# Patient Record
Sex: Female | Born: 1935 | Race: White | Hispanic: No | Smoking: Former smoker
Health system: Southern US, Community
[De-identification: ages and names within clinical notes are randomized; demographics above are authoritative.]

## PROBLEM LIST (undated history)

## (undated) DIAGNOSIS — N393 Stress incontinence (female) (male): Secondary | ICD-10-CM

## (undated) DIAGNOSIS — J45909 Unspecified asthma, uncomplicated: Secondary | ICD-10-CM

## (undated) DIAGNOSIS — K5790 Diverticulosis of intestine, part unspecified, without perforation or abscess without bleeding: Secondary | ICD-10-CM

## (undated) DIAGNOSIS — K219 Gastro-esophageal reflux disease without esophagitis: Secondary | ICD-10-CM

## (undated) DIAGNOSIS — G459 Transient cerebral ischemic attack, unspecified: Secondary | ICD-10-CM

## (undated) DIAGNOSIS — M1990 Unspecified osteoarthritis, unspecified site: Secondary | ICD-10-CM

## (undated) DIAGNOSIS — I480 Paroxysmal atrial fibrillation: Secondary | ICD-10-CM

## (undated) DIAGNOSIS — J449 Chronic obstructive pulmonary disease, unspecified: Secondary | ICD-10-CM

## (undated) DIAGNOSIS — Z8489 Family history of other specified conditions: Secondary | ICD-10-CM

## (undated) DIAGNOSIS — E785 Hyperlipidemia, unspecified: Secondary | ICD-10-CM

## (undated) DIAGNOSIS — C439 Malignant melanoma of skin, unspecified: Secondary | ICD-10-CM

## (undated) DIAGNOSIS — I2510 Atherosclerotic heart disease of native coronary artery without angina pectoris: Secondary | ICD-10-CM

## (undated) DIAGNOSIS — K5900 Constipation, unspecified: Secondary | ICD-10-CM

## (undated) DIAGNOSIS — G629 Polyneuropathy, unspecified: Secondary | ICD-10-CM

## (undated) DIAGNOSIS — F419 Anxiety disorder, unspecified: Secondary | ICD-10-CM

## (undated) DIAGNOSIS — I10 Essential (primary) hypertension: Secondary | ICD-10-CM

## (undated) HISTORY — PX: NASAL SINUS SURGERY: SHX719

## (undated) HISTORY — PX: COLONOSCOPY: SHX174

## (undated) HISTORY — PX: APPENDECTOMY: SHX54

## (undated) HISTORY — DX: Essential (primary) hypertension: I10

## (undated) HISTORY — DX: Atherosclerotic heart disease of native coronary artery without angina pectoris: I25.10

## (undated) HISTORY — PX: EYE SURGERY: SHX253

## (undated) HISTORY — DX: Paroxysmal atrial fibrillation: I48.0

## (undated) HISTORY — PX: CHOLECYSTECTOMY: SHX55

## (undated) HISTORY — PX: CORONARY ANGIOPLASTY WITH STENT PLACEMENT: SHX49

## (undated) HISTORY — DX: Gastro-esophageal reflux disease without esophagitis: K21.9

## (undated) HISTORY — PX: VESICOVAGINAL FISTULA CLOSURE W/ TAH: SUR271

## (undated) HISTORY — DX: Hyperlipidemia, unspecified: E78.5

## (undated) HISTORY — DX: Diverticulosis of intestine, part unspecified, without perforation or abscess without bleeding: K57.90

## (undated) HISTORY — PX: BACK SURGERY: SHX140

## (undated) HISTORY — PX: ABDOMINAL HYSTERECTOMY: SHX81

---

## 1997-12-26 ENCOUNTER — Ambulatory Visit (HOSPITAL_COMMUNITY): Admission: RE | Admit: 1997-12-26 | Discharge: 1997-12-26 | Payer: Self-pay | Admitting: Obstetrics & Gynecology

## 1998-01-25 ENCOUNTER — Other Ambulatory Visit: Admission: RE | Admit: 1998-01-25 | Discharge: 1998-01-25 | Payer: Self-pay | Admitting: Obstetrics & Gynecology

## 1998-02-28 ENCOUNTER — Other Ambulatory Visit: Admission: RE | Admit: 1998-02-28 | Discharge: 1998-02-28 | Payer: Self-pay | Admitting: Gastroenterology

## 1998-02-28 ENCOUNTER — Encounter: Payer: Self-pay | Admitting: Gastroenterology

## 1998-12-30 ENCOUNTER — Ambulatory Visit (HOSPITAL_COMMUNITY): Admission: RE | Admit: 1998-12-30 | Discharge: 1998-12-30 | Payer: Self-pay | Admitting: Obstetrics & Gynecology

## 1999-02-28 ENCOUNTER — Other Ambulatory Visit: Admission: RE | Admit: 1999-02-28 | Discharge: 1999-02-28 | Payer: Self-pay | Admitting: Obstetrics & Gynecology

## 1999-10-13 ENCOUNTER — Emergency Department (HOSPITAL_COMMUNITY): Admission: EM | Admit: 1999-10-13 | Discharge: 1999-10-13 | Payer: Self-pay | Admitting: *Deleted

## 1999-10-15 ENCOUNTER — Ambulatory Visit (HOSPITAL_COMMUNITY): Admission: RE | Admit: 1999-10-15 | Discharge: 1999-10-15 | Payer: Self-pay | Admitting: Family Medicine

## 1999-11-11 ENCOUNTER — Encounter: Payer: Self-pay | Admitting: *Deleted

## 1999-11-11 ENCOUNTER — Ambulatory Visit (HOSPITAL_COMMUNITY): Admission: RE | Admit: 1999-11-11 | Discharge: 1999-11-11 | Payer: Self-pay | Admitting: *Deleted

## 2000-01-20 ENCOUNTER — Encounter: Payer: Self-pay | Admitting: *Deleted

## 2000-01-20 ENCOUNTER — Encounter: Admission: RE | Admit: 2000-01-20 | Discharge: 2000-01-20 | Payer: Self-pay | Admitting: *Deleted

## 2000-01-22 ENCOUNTER — Ambulatory Visit (HOSPITAL_BASED_OUTPATIENT_CLINIC_OR_DEPARTMENT_OTHER): Admission: RE | Admit: 2000-01-22 | Discharge: 2000-01-22 | Payer: Self-pay | Admitting: *Deleted

## 2000-01-22 ENCOUNTER — Encounter (INDEPENDENT_AMBULATORY_CARE_PROVIDER_SITE_OTHER): Payer: Self-pay | Admitting: *Deleted

## 2000-02-11 ENCOUNTER — Ambulatory Visit (HOSPITAL_COMMUNITY): Admission: RE | Admit: 2000-02-11 | Discharge: 2000-02-11 | Payer: Self-pay | Admitting: Obstetrics & Gynecology

## 2000-02-11 ENCOUNTER — Encounter: Payer: Self-pay | Admitting: Obstetrics & Gynecology

## 2000-07-22 ENCOUNTER — Encounter: Payer: Self-pay | Admitting: Gastroenterology

## 2000-07-28 ENCOUNTER — Encounter: Payer: Self-pay | Admitting: Gastroenterology

## 2000-07-28 ENCOUNTER — Encounter (INDEPENDENT_AMBULATORY_CARE_PROVIDER_SITE_OTHER): Payer: Self-pay

## 2000-07-28 ENCOUNTER — Other Ambulatory Visit: Admission: RE | Admit: 2000-07-28 | Discharge: 2000-07-28 | Payer: Self-pay | Admitting: Gastroenterology

## 2000-08-09 ENCOUNTER — Encounter: Payer: Self-pay | Admitting: Family Medicine

## 2000-08-09 ENCOUNTER — Encounter: Admission: RE | Admit: 2000-08-09 | Discharge: 2000-08-09 | Payer: Self-pay | Admitting: Family Medicine

## 2001-02-14 ENCOUNTER — Ambulatory Visit (HOSPITAL_COMMUNITY): Admission: RE | Admit: 2001-02-14 | Discharge: 2001-02-14 | Payer: Self-pay | Admitting: Obstetrics & Gynecology

## 2001-02-14 ENCOUNTER — Encounter: Payer: Self-pay | Admitting: Obstetrics & Gynecology

## 2001-04-27 ENCOUNTER — Encounter: Payer: Self-pay | Admitting: Obstetrics & Gynecology

## 2001-04-27 ENCOUNTER — Ambulatory Visit (HOSPITAL_COMMUNITY): Admission: RE | Admit: 2001-04-27 | Discharge: 2001-04-27 | Payer: Self-pay | Admitting: Obstetrics & Gynecology

## 2001-12-21 ENCOUNTER — Emergency Department (HOSPITAL_COMMUNITY): Admission: EM | Admit: 2001-12-21 | Discharge: 2001-12-22 | Payer: Self-pay | Admitting: Emergency Medicine

## 2001-12-21 ENCOUNTER — Encounter: Payer: Self-pay | Admitting: Emergency Medicine

## 2002-02-16 ENCOUNTER — Encounter: Payer: Self-pay | Admitting: Obstetrics & Gynecology

## 2002-02-16 ENCOUNTER — Ambulatory Visit (HOSPITAL_COMMUNITY): Admission: RE | Admit: 2002-02-16 | Discharge: 2002-02-16 | Payer: Self-pay | Admitting: Obstetrics & Gynecology

## 2002-05-17 ENCOUNTER — Other Ambulatory Visit: Admission: RE | Admit: 2002-05-17 | Discharge: 2002-05-17 | Payer: Self-pay | Admitting: Obstetrics & Gynecology

## 2003-02-20 ENCOUNTER — Ambulatory Visit (HOSPITAL_COMMUNITY): Admission: RE | Admit: 2003-02-20 | Discharge: 2003-02-20 | Payer: Self-pay | Admitting: Obstetrics & Gynecology

## 2003-02-20 ENCOUNTER — Encounter: Payer: Self-pay | Admitting: Obstetrics & Gynecology

## 2003-06-27 ENCOUNTER — Encounter: Admission: RE | Admit: 2003-06-27 | Discharge: 2003-06-27 | Payer: Self-pay | Admitting: Orthopedic Surgery

## 2003-06-27 ENCOUNTER — Ambulatory Visit (HOSPITAL_BASED_OUTPATIENT_CLINIC_OR_DEPARTMENT_OTHER): Admission: RE | Admit: 2003-06-27 | Discharge: 2003-06-27 | Payer: Self-pay | Admitting: Orthopedic Surgery

## 2003-06-27 ENCOUNTER — Ambulatory Visit (HOSPITAL_COMMUNITY): Admission: RE | Admit: 2003-06-27 | Discharge: 2003-06-27 | Payer: Self-pay | Admitting: Orthopedic Surgery

## 2003-07-23 ENCOUNTER — Ambulatory Visit (HOSPITAL_BASED_OUTPATIENT_CLINIC_OR_DEPARTMENT_OTHER): Admission: RE | Admit: 2003-07-23 | Discharge: 2003-07-23 | Payer: Self-pay | Admitting: Orthopedic Surgery

## 2003-07-23 ENCOUNTER — Ambulatory Visit (HOSPITAL_COMMUNITY): Admission: RE | Admit: 2003-07-23 | Discharge: 2003-07-23 | Payer: Self-pay | Admitting: Orthopedic Surgery

## 2003-11-19 ENCOUNTER — Encounter: Payer: Self-pay | Admitting: Gastroenterology

## 2004-03-18 ENCOUNTER — Ambulatory Visit (HOSPITAL_COMMUNITY): Admission: RE | Admit: 2004-03-18 | Discharge: 2004-03-18 | Payer: Self-pay | Admitting: Obstetrics & Gynecology

## 2004-06-26 ENCOUNTER — Ambulatory Visit (HOSPITAL_COMMUNITY): Admission: RE | Admit: 2004-06-26 | Discharge: 2004-06-26 | Payer: Self-pay | Admitting: Family Medicine

## 2004-07-29 ENCOUNTER — Encounter: Admission: RE | Admit: 2004-07-29 | Discharge: 2004-07-29 | Payer: Self-pay | Admitting: Family Medicine

## 2004-08-24 ENCOUNTER — Emergency Department (HOSPITAL_COMMUNITY): Admission: EM | Admit: 2004-08-24 | Discharge: 2004-08-24 | Payer: Self-pay | Admitting: Emergency Medicine

## 2005-01-16 ENCOUNTER — Encounter: Admission: RE | Admit: 2005-01-16 | Discharge: 2005-01-16 | Payer: Self-pay | Admitting: Family Medicine

## 2005-03-04 ENCOUNTER — Ambulatory Visit: Payer: Self-pay | Admitting: Pulmonary Disease

## 2005-03-05 ENCOUNTER — Ambulatory Visit: Payer: Self-pay | Admitting: Internal Medicine

## 2005-03-05 ENCOUNTER — Encounter: Payer: Self-pay | Admitting: Pulmonary Disease

## 2005-04-01 ENCOUNTER — Ambulatory Visit: Payer: Self-pay | Admitting: Pulmonary Disease

## 2005-04-22 ENCOUNTER — Emergency Department (HOSPITAL_COMMUNITY): Admission: EM | Admit: 2005-04-22 | Discharge: 2005-04-22 | Payer: Self-pay | Admitting: Emergency Medicine

## 2005-04-29 ENCOUNTER — Ambulatory Visit: Payer: Self-pay | Admitting: Pulmonary Disease

## 2005-05-05 ENCOUNTER — Ambulatory Visit: Payer: Self-pay | Admitting: Pulmonary Disease

## 2005-05-15 ENCOUNTER — Ambulatory Visit (HOSPITAL_COMMUNITY): Admission: RE | Admit: 2005-05-15 | Discharge: 2005-05-15 | Payer: Self-pay | Admitting: Obstetrics & Gynecology

## 2005-05-20 ENCOUNTER — Ambulatory Visit: Payer: Self-pay | Admitting: Pulmonary Disease

## 2005-06-16 ENCOUNTER — Encounter: Payer: Self-pay | Admitting: Pulmonary Disease

## 2005-06-22 ENCOUNTER — Ambulatory Visit: Payer: Self-pay | Admitting: Pulmonary Disease

## 2005-07-06 DIAGNOSIS — I2510 Atherosclerotic heart disease of native coronary artery without angina pectoris: Secondary | ICD-10-CM

## 2005-07-06 HISTORY — DX: Atherosclerotic heart disease of native coronary artery without angina pectoris: I25.10

## 2005-07-10 ENCOUNTER — Ambulatory Visit: Payer: Self-pay | Admitting: Pulmonary Disease

## 2005-07-15 ENCOUNTER — Other Ambulatory Visit: Admission: RE | Admit: 2005-07-15 | Discharge: 2005-07-15 | Payer: Self-pay | Admitting: Obstetrics & Gynecology

## 2005-08-20 ENCOUNTER — Ambulatory Visit: Payer: Self-pay | Admitting: Pulmonary Disease

## 2005-10-05 ENCOUNTER — Ambulatory Visit: Payer: Self-pay | Admitting: Pulmonary Disease

## 2005-10-19 ENCOUNTER — Ambulatory Visit: Payer: Self-pay | Admitting: Pulmonary Disease

## 2005-10-22 ENCOUNTER — Encounter: Payer: Self-pay | Admitting: Vascular Surgery

## 2005-10-22 ENCOUNTER — Ambulatory Visit (HOSPITAL_COMMUNITY): Admission: RE | Admit: 2005-10-22 | Discharge: 2005-10-22 | Payer: Self-pay | Admitting: Cardiology

## 2005-11-03 ENCOUNTER — Ambulatory Visit: Payer: Self-pay | Admitting: Internal Medicine

## 2005-11-14 ENCOUNTER — Encounter: Admission: RE | Admit: 2005-11-14 | Discharge: 2005-11-14 | Payer: Self-pay | Admitting: Family Medicine

## 2005-11-17 ENCOUNTER — Ambulatory Visit: Payer: Self-pay | Admitting: Internal Medicine

## 2005-12-01 ENCOUNTER — Ambulatory Visit: Payer: Self-pay | Admitting: Internal Medicine

## 2005-12-15 ENCOUNTER — Ambulatory Visit: Payer: Self-pay | Admitting: Internal Medicine

## 2005-12-29 ENCOUNTER — Ambulatory Visit: Payer: Self-pay | Admitting: Internal Medicine

## 2006-01-04 ENCOUNTER — Ambulatory Visit: Payer: Self-pay | Admitting: Pulmonary Disease

## 2006-01-12 ENCOUNTER — Ambulatory Visit: Payer: Self-pay | Admitting: Pulmonary Disease

## 2006-02-09 ENCOUNTER — Ambulatory Visit: Payer: Self-pay | Admitting: Pulmonary Disease

## 2006-03-10 ENCOUNTER — Ambulatory Visit: Payer: Self-pay | Admitting: Pulmonary Disease

## 2006-03-24 ENCOUNTER — Ambulatory Visit: Payer: Self-pay | Admitting: Pulmonary Disease

## 2006-04-07 ENCOUNTER — Ambulatory Visit: Payer: Self-pay | Admitting: Pulmonary Disease

## 2006-04-21 ENCOUNTER — Ambulatory Visit: Payer: Self-pay | Admitting: Pulmonary Disease

## 2006-05-05 ENCOUNTER — Ambulatory Visit: Payer: Self-pay | Admitting: Pulmonary Disease

## 2006-05-19 ENCOUNTER — Ambulatory Visit: Payer: Self-pay | Admitting: Pulmonary Disease

## 2006-06-06 ENCOUNTER — Emergency Department (HOSPITAL_COMMUNITY): Admission: EM | Admit: 2006-06-06 | Discharge: 2006-06-06 | Payer: Self-pay | Admitting: Family Medicine

## 2006-06-16 ENCOUNTER — Ambulatory Visit (HOSPITAL_COMMUNITY): Admission: RE | Admit: 2006-06-16 | Discharge: 2006-06-16 | Payer: Self-pay | Admitting: Obstetrics & Gynecology

## 2006-07-14 ENCOUNTER — Ambulatory Visit: Payer: Self-pay | Admitting: Pulmonary Disease

## 2006-07-15 ENCOUNTER — Encounter: Payer: Self-pay | Admitting: Pulmonary Disease

## 2006-08-15 ENCOUNTER — Emergency Department (HOSPITAL_COMMUNITY): Admission: EM | Admit: 2006-08-15 | Discharge: 2006-08-15 | Payer: Self-pay

## 2006-08-25 ENCOUNTER — Ambulatory Visit: Payer: Self-pay | Admitting: Internal Medicine

## 2006-09-02 ENCOUNTER — Ambulatory Visit: Payer: Self-pay | Admitting: Pulmonary Disease

## 2006-09-07 ENCOUNTER — Ambulatory Visit: Payer: Self-pay | Admitting: Pulmonary Disease

## 2006-09-21 ENCOUNTER — Ambulatory Visit: Payer: Self-pay | Admitting: Pulmonary Disease

## 2006-10-05 ENCOUNTER — Ambulatory Visit: Payer: Self-pay | Admitting: Pulmonary Disease

## 2006-10-08 ENCOUNTER — Encounter: Payer: Self-pay | Admitting: Pulmonary Disease

## 2006-10-19 ENCOUNTER — Ambulatory Visit: Payer: Self-pay | Admitting: Pulmonary Disease

## 2006-11-04 ENCOUNTER — Ambulatory Visit: Payer: Self-pay | Admitting: Pulmonary Disease

## 2006-11-18 ENCOUNTER — Ambulatory Visit: Payer: Self-pay | Admitting: Pulmonary Disease

## 2006-11-30 ENCOUNTER — Ambulatory Visit: Payer: Self-pay | Admitting: Pulmonary Disease

## 2006-12-03 ENCOUNTER — Ambulatory Visit: Payer: Self-pay | Admitting: Pulmonary Disease

## 2006-12-17 ENCOUNTER — Ambulatory Visit: Payer: Self-pay | Admitting: Pulmonary Disease

## 2006-12-31 ENCOUNTER — Ambulatory Visit: Payer: Self-pay | Admitting: Pulmonary Disease

## 2007-01-14 ENCOUNTER — Ambulatory Visit: Payer: Self-pay | Admitting: Pulmonary Disease

## 2007-01-28 ENCOUNTER — Ambulatory Visit: Payer: Self-pay | Admitting: Pulmonary Disease

## 2007-02-11 ENCOUNTER — Ambulatory Visit: Payer: Self-pay | Admitting: Pulmonary Disease

## 2007-02-25 ENCOUNTER — Ambulatory Visit: Payer: Self-pay | Admitting: Pulmonary Disease

## 2007-03-09 ENCOUNTER — Ambulatory Visit: Payer: Self-pay | Admitting: Pulmonary Disease

## 2007-03-23 ENCOUNTER — Ambulatory Visit: Payer: Self-pay | Admitting: Pulmonary Disease

## 2007-04-06 ENCOUNTER — Ambulatory Visit: Payer: Self-pay | Admitting: Pulmonary Disease

## 2007-04-20 ENCOUNTER — Ambulatory Visit: Payer: Self-pay | Admitting: Pulmonary Disease

## 2007-05-04 ENCOUNTER — Ambulatory Visit: Payer: Self-pay | Admitting: Pulmonary Disease

## 2007-05-10 ENCOUNTER — Ambulatory Visit: Payer: Self-pay | Admitting: Pulmonary Disease

## 2007-05-18 ENCOUNTER — Ambulatory Visit: Payer: Self-pay | Admitting: Pulmonary Disease

## 2007-06-01 ENCOUNTER — Ambulatory Visit: Payer: Self-pay | Admitting: Pulmonary Disease

## 2007-06-15 ENCOUNTER — Ambulatory Visit: Payer: Self-pay | Admitting: Pulmonary Disease

## 2007-06-15 DIAGNOSIS — J45909 Unspecified asthma, uncomplicated: Secondary | ICD-10-CM | POA: Insufficient documentation

## 2007-06-28 ENCOUNTER — Ambulatory Visit: Payer: Self-pay | Admitting: Internal Medicine

## 2007-07-13 ENCOUNTER — Ambulatory Visit: Payer: Self-pay | Admitting: Pulmonary Disease

## 2007-07-13 DIAGNOSIS — J329 Chronic sinusitis, unspecified: Secondary | ICD-10-CM | POA: Insufficient documentation

## 2007-07-27 ENCOUNTER — Ambulatory Visit: Payer: Self-pay | Admitting: Pulmonary Disease

## 2007-07-29 ENCOUNTER — Ambulatory Visit (HOSPITAL_COMMUNITY): Admission: RE | Admit: 2007-07-29 | Discharge: 2007-07-29 | Payer: Self-pay | Admitting: Obstetrics & Gynecology

## 2007-08-04 ENCOUNTER — Telehealth: Payer: Self-pay | Admitting: Pulmonary Disease

## 2007-08-10 ENCOUNTER — Ambulatory Visit: Payer: Self-pay | Admitting: Pulmonary Disease

## 2007-08-24 ENCOUNTER — Ambulatory Visit: Payer: Self-pay | Admitting: Pulmonary Disease

## 2007-09-07 ENCOUNTER — Ambulatory Visit: Payer: Self-pay | Admitting: Pulmonary Disease

## 2007-09-17 ENCOUNTER — Emergency Department (HOSPITAL_COMMUNITY): Admission: EM | Admit: 2007-09-17 | Discharge: 2007-09-17 | Payer: Self-pay | Admitting: Emergency Medicine

## 2007-09-21 ENCOUNTER — Ambulatory Visit: Payer: Self-pay | Admitting: Pulmonary Disease

## 2007-09-23 ENCOUNTER — Ambulatory Visit (HOSPITAL_COMMUNITY): Admission: RE | Admit: 2007-09-23 | Discharge: 2007-09-23 | Payer: Self-pay | Admitting: Cardiology

## 2007-09-23 ENCOUNTER — Encounter: Payer: Self-pay | Admitting: Pulmonary Disease

## 2007-09-30 ENCOUNTER — Telehealth: Payer: Self-pay | Admitting: Pulmonary Disease

## 2007-10-06 ENCOUNTER — Ambulatory Visit: Payer: Self-pay | Admitting: Pulmonary Disease

## 2007-10-20 ENCOUNTER — Ambulatory Visit: Payer: Self-pay | Admitting: Pulmonary Disease

## 2007-11-04 ENCOUNTER — Ambulatory Visit: Payer: Self-pay | Admitting: Pulmonary Disease

## 2007-11-10 ENCOUNTER — Ambulatory Visit: Payer: Self-pay | Admitting: Pulmonary Disease

## 2007-11-17 ENCOUNTER — Ambulatory Visit: Payer: Self-pay | Admitting: Pulmonary Disease

## 2007-11-22 ENCOUNTER — Telehealth: Payer: Self-pay | Admitting: Pulmonary Disease

## 2007-12-01 ENCOUNTER — Ambulatory Visit: Payer: Self-pay | Admitting: Pulmonary Disease

## 2007-12-15 ENCOUNTER — Ambulatory Visit: Payer: Self-pay | Admitting: Pulmonary Disease

## 2007-12-16 ENCOUNTER — Telehealth: Payer: Self-pay | Admitting: Pulmonary Disease

## 2007-12-29 ENCOUNTER — Ambulatory Visit: Payer: Self-pay | Admitting: Pulmonary Disease

## 2008-01-13 ENCOUNTER — Ambulatory Visit: Payer: Self-pay | Admitting: Pulmonary Disease

## 2008-01-27 ENCOUNTER — Ambulatory Visit: Payer: Self-pay | Admitting: Pulmonary Disease

## 2008-02-10 ENCOUNTER — Ambulatory Visit: Payer: Self-pay | Admitting: Pulmonary Disease

## 2008-02-16 ENCOUNTER — Emergency Department (HOSPITAL_COMMUNITY): Admission: EM | Admit: 2008-02-16 | Discharge: 2008-02-17 | Payer: Self-pay | Admitting: Emergency Medicine

## 2008-02-19 ENCOUNTER — Emergency Department (HOSPITAL_COMMUNITY): Admission: EM | Admit: 2008-02-19 | Discharge: 2008-02-19 | Payer: Self-pay | Admitting: Family Medicine

## 2008-02-24 ENCOUNTER — Ambulatory Visit: Payer: Self-pay | Admitting: Pulmonary Disease

## 2008-02-24 ENCOUNTER — Encounter: Admission: RE | Admit: 2008-02-24 | Discharge: 2008-02-24 | Payer: Self-pay | Admitting: Orthopedic Surgery

## 2008-03-05 ENCOUNTER — Encounter: Admission: RE | Admit: 2008-03-05 | Discharge: 2008-03-05 | Payer: Self-pay | Admitting: Orthopedic Surgery

## 2008-03-06 ENCOUNTER — Ambulatory Visit (HOSPITAL_BASED_OUTPATIENT_CLINIC_OR_DEPARTMENT_OTHER): Admission: RE | Admit: 2008-03-06 | Discharge: 2008-03-06 | Payer: Self-pay | Admitting: Orthopedic Surgery

## 2008-03-13 ENCOUNTER — Ambulatory Visit: Payer: Self-pay | Admitting: Pulmonary Disease

## 2008-03-27 ENCOUNTER — Ambulatory Visit: Payer: Self-pay | Admitting: Pulmonary Disease

## 2008-04-06 ENCOUNTER — Ambulatory Visit: Payer: Self-pay | Admitting: Pulmonary Disease

## 2008-04-10 ENCOUNTER — Ambulatory Visit: Payer: Self-pay | Admitting: Pulmonary Disease

## 2008-04-24 ENCOUNTER — Ambulatory Visit: Payer: Self-pay | Admitting: Pulmonary Disease

## 2008-05-08 ENCOUNTER — Ambulatory Visit: Payer: Self-pay | Admitting: Pulmonary Disease

## 2008-05-14 DIAGNOSIS — F321 Major depressive disorder, single episode, moderate: Secondary | ICD-10-CM

## 2008-05-22 ENCOUNTER — Ambulatory Visit: Payer: Self-pay | Admitting: Pulmonary Disease

## 2008-06-05 ENCOUNTER — Ambulatory Visit: Payer: Self-pay | Admitting: Pulmonary Disease

## 2008-06-05 ENCOUNTER — Encounter: Payer: Self-pay | Admitting: Internal Medicine

## 2008-06-19 ENCOUNTER — Ambulatory Visit: Payer: Self-pay | Admitting: Pulmonary Disease

## 2008-07-03 ENCOUNTER — Ambulatory Visit: Payer: Self-pay | Admitting: Pulmonary Disease

## 2008-07-16 ENCOUNTER — Ambulatory Visit: Payer: Self-pay | Admitting: Pulmonary Disease

## 2008-07-30 ENCOUNTER — Ambulatory Visit: Payer: Self-pay | Admitting: Pulmonary Disease

## 2008-08-14 ENCOUNTER — Ambulatory Visit: Payer: Self-pay | Admitting: Pulmonary Disease

## 2008-08-20 ENCOUNTER — Telehealth: Payer: Self-pay | Admitting: Pulmonary Disease

## 2008-08-22 ENCOUNTER — Ambulatory Visit (HOSPITAL_COMMUNITY): Admission: RE | Admit: 2008-08-22 | Discharge: 2008-08-22 | Payer: Self-pay | Admitting: Obstetrics & Gynecology

## 2008-08-28 ENCOUNTER — Ambulatory Visit: Payer: Self-pay | Admitting: Pulmonary Disease

## 2008-09-11 ENCOUNTER — Ambulatory Visit: Payer: Self-pay | Admitting: Pulmonary Disease

## 2008-09-21 ENCOUNTER — Telehealth: Payer: Self-pay | Admitting: Pulmonary Disease

## 2008-09-26 ENCOUNTER — Ambulatory Visit: Payer: Self-pay | Admitting: Pulmonary Disease

## 2008-09-28 ENCOUNTER — Encounter (INDEPENDENT_AMBULATORY_CARE_PROVIDER_SITE_OTHER): Payer: Self-pay | Admitting: *Deleted

## 2008-10-09 ENCOUNTER — Ambulatory Visit: Payer: Self-pay | Admitting: Pulmonary Disease

## 2008-10-23 ENCOUNTER — Ambulatory Visit: Payer: Self-pay | Admitting: Pulmonary Disease

## 2008-11-06 ENCOUNTER — Ambulatory Visit: Payer: Self-pay | Admitting: Pulmonary Disease

## 2008-11-13 ENCOUNTER — Ambulatory Visit: Payer: Self-pay | Admitting: Pulmonary Disease

## 2008-11-13 DIAGNOSIS — J301 Allergic rhinitis due to pollen: Secondary | ICD-10-CM | POA: Insufficient documentation

## 2008-11-21 ENCOUNTER — Ambulatory Visit: Payer: Self-pay | Admitting: Internal Medicine

## 2008-12-05 ENCOUNTER — Ambulatory Visit: Payer: Self-pay | Admitting: Pulmonary Disease

## 2008-12-19 ENCOUNTER — Ambulatory Visit: Payer: Self-pay | Admitting: Pulmonary Disease

## 2009-01-02 ENCOUNTER — Ambulatory Visit: Payer: Self-pay | Admitting: Pulmonary Disease

## 2009-01-16 ENCOUNTER — Ambulatory Visit: Payer: Self-pay | Admitting: Pulmonary Disease

## 2009-01-30 ENCOUNTER — Ambulatory Visit: Payer: Self-pay | Admitting: Pulmonary Disease

## 2009-02-08 ENCOUNTER — Observation Stay (HOSPITAL_COMMUNITY): Admission: EM | Admit: 2009-02-08 | Discharge: 2009-02-08 | Payer: Self-pay | Admitting: Emergency Medicine

## 2009-02-08 ENCOUNTER — Ambulatory Visit: Payer: Self-pay | Admitting: Internal Medicine

## 2009-02-13 ENCOUNTER — Ambulatory Visit: Payer: Self-pay | Admitting: Pulmonary Disease

## 2009-02-27 ENCOUNTER — Ambulatory Visit: Payer: Self-pay | Admitting: Pulmonary Disease

## 2009-03-05 ENCOUNTER — Emergency Department (HOSPITAL_COMMUNITY): Admission: EM | Admit: 2009-03-05 | Discharge: 2009-03-05 | Payer: Self-pay | Admitting: Emergency Medicine

## 2009-03-13 ENCOUNTER — Ambulatory Visit: Payer: Self-pay | Admitting: Pulmonary Disease

## 2009-03-13 ENCOUNTER — Encounter: Payer: Self-pay | Admitting: Internal Medicine

## 2009-03-27 ENCOUNTER — Ambulatory Visit: Payer: Self-pay | Admitting: Pulmonary Disease

## 2009-04-10 ENCOUNTER — Ambulatory Visit: Payer: Self-pay | Admitting: Pulmonary Disease

## 2009-04-18 ENCOUNTER — Ambulatory Visit: Payer: Self-pay | Admitting: Pulmonary Disease

## 2009-04-24 ENCOUNTER — Ambulatory Visit: Payer: Self-pay | Admitting: Pulmonary Disease

## 2009-05-08 ENCOUNTER — Ambulatory Visit: Payer: Self-pay | Admitting: Pulmonary Disease

## 2009-05-22 ENCOUNTER — Ambulatory Visit: Payer: Self-pay | Admitting: Pulmonary Disease

## 2009-06-06 ENCOUNTER — Ambulatory Visit: Payer: Self-pay | Admitting: Pulmonary Disease

## 2009-06-26 ENCOUNTER — Ambulatory Visit: Payer: Self-pay | Admitting: Pulmonary Disease

## 2009-07-02 DIAGNOSIS — F411 Generalized anxiety disorder: Secondary | ICD-10-CM | POA: Insufficient documentation

## 2009-07-11 ENCOUNTER — Ambulatory Visit: Payer: Self-pay | Admitting: Pulmonary Disease

## 2009-07-25 ENCOUNTER — Ambulatory Visit: Payer: Self-pay | Admitting: Pulmonary Disease

## 2009-07-30 ENCOUNTER — Encounter (INDEPENDENT_AMBULATORY_CARE_PROVIDER_SITE_OTHER): Payer: Self-pay | Admitting: *Deleted

## 2009-08-08 ENCOUNTER — Ambulatory Visit: Payer: Self-pay | Admitting: Pulmonary Disease

## 2009-08-21 ENCOUNTER — Ambulatory Visit: Payer: Self-pay | Admitting: Pulmonary Disease

## 2009-08-23 ENCOUNTER — Ambulatory Visit: Payer: Self-pay | Admitting: Gastroenterology

## 2009-08-23 ENCOUNTER — Encounter (INDEPENDENT_AMBULATORY_CARE_PROVIDER_SITE_OTHER): Payer: Self-pay | Admitting: *Deleted

## 2009-08-23 DIAGNOSIS — R634 Abnormal weight loss: Secondary | ICD-10-CM

## 2009-08-23 DIAGNOSIS — Z860100 Personal history of colon polyps, unspecified: Secondary | ICD-10-CM | POA: Insufficient documentation

## 2009-08-23 DIAGNOSIS — R198 Other specified symptoms and signs involving the digestive system and abdomen: Secondary | ICD-10-CM | POA: Insufficient documentation

## 2009-09-04 ENCOUNTER — Ambulatory Visit: Payer: Self-pay | Admitting: Pulmonary Disease

## 2009-09-13 ENCOUNTER — Ambulatory Visit (HOSPITAL_COMMUNITY): Admission: RE | Admit: 2009-09-13 | Discharge: 2009-09-13 | Payer: Self-pay | Admitting: Obstetrics & Gynecology

## 2009-09-18 ENCOUNTER — Ambulatory Visit: Payer: Self-pay | Admitting: Pulmonary Disease

## 2009-09-23 ENCOUNTER — Telehealth: Payer: Self-pay | Admitting: Pulmonary Disease

## 2009-10-02 ENCOUNTER — Ambulatory Visit: Payer: Self-pay | Admitting: Pulmonary Disease

## 2009-10-10 ENCOUNTER — Ambulatory Visit: Payer: Self-pay | Admitting: Gastroenterology

## 2009-10-11 ENCOUNTER — Encounter: Payer: Self-pay | Admitting: Gastroenterology

## 2009-10-11 ENCOUNTER — Encounter (INDEPENDENT_AMBULATORY_CARE_PROVIDER_SITE_OTHER): Payer: Self-pay

## 2009-10-14 ENCOUNTER — Ambulatory Visit: Payer: Self-pay | Admitting: Gastroenterology

## 2009-10-14 LAB — CONVERTED CEMR LAB
BUN: 10 mg/dL (ref 6–23)
Creatinine, Ser: 0.8 mg/dL (ref 0.4–1.2)

## 2009-10-15 ENCOUNTER — Ambulatory Visit: Payer: Self-pay | Admitting: Pulmonary Disease

## 2009-10-16 ENCOUNTER — Ambulatory Visit (HOSPITAL_COMMUNITY): Admission: RE | Admit: 2009-10-16 | Discharge: 2009-10-16 | Payer: Self-pay | Admitting: Gastroenterology

## 2009-10-29 ENCOUNTER — Ambulatory Visit: Payer: Self-pay | Admitting: Pulmonary Disease

## 2009-11-08 ENCOUNTER — Ambulatory Visit: Payer: Self-pay | Admitting: Pulmonary Disease

## 2009-11-22 ENCOUNTER — Ambulatory Visit: Payer: Self-pay | Admitting: Pulmonary Disease

## 2009-12-10 ENCOUNTER — Ambulatory Visit: Payer: Self-pay | Admitting: Critical Care Medicine

## 2009-12-24 ENCOUNTER — Encounter: Payer: Self-pay | Admitting: Internal Medicine

## 2009-12-24 ENCOUNTER — Ambulatory Visit: Payer: Self-pay | Admitting: Pulmonary Disease

## 2010-01-07 ENCOUNTER — Ambulatory Visit: Payer: Self-pay | Admitting: Pulmonary Disease

## 2010-01-21 ENCOUNTER — Ambulatory Visit: Payer: Self-pay | Admitting: Pulmonary Disease

## 2010-02-04 ENCOUNTER — Ambulatory Visit: Payer: Self-pay | Admitting: Pulmonary Disease

## 2010-02-18 ENCOUNTER — Ambulatory Visit: Payer: Self-pay | Admitting: Pulmonary Disease

## 2010-03-04 ENCOUNTER — Ambulatory Visit: Payer: Self-pay | Admitting: Critical Care Medicine

## 2010-03-12 ENCOUNTER — Ambulatory Visit: Payer: Self-pay | Admitting: Pulmonary Disease

## 2010-03-18 ENCOUNTER — Ambulatory Visit: Payer: Self-pay | Admitting: Pulmonary Disease

## 2010-04-01 ENCOUNTER — Ambulatory Visit: Payer: Self-pay | Admitting: Pulmonary Disease

## 2010-04-15 ENCOUNTER — Ambulatory Visit: Payer: Self-pay | Admitting: Pulmonary Disease

## 2010-04-23 ENCOUNTER — Ambulatory Visit: Payer: Self-pay | Admitting: Pulmonary Disease

## 2010-04-29 ENCOUNTER — Ambulatory Visit: Payer: Self-pay | Admitting: Pulmonary Disease

## 2010-05-13 ENCOUNTER — Ambulatory Visit: Payer: Self-pay | Admitting: Pulmonary Disease

## 2010-05-30 ENCOUNTER — Ambulatory Visit: Payer: Self-pay | Admitting: Pulmonary Disease

## 2010-06-13 ENCOUNTER — Ambulatory Visit: Payer: Self-pay | Admitting: Pulmonary Disease

## 2010-06-26 ENCOUNTER — Ambulatory Visit: Payer: Self-pay | Admitting: Pulmonary Disease

## 2010-06-26 ENCOUNTER — Telehealth: Payer: Self-pay | Admitting: Pulmonary Disease

## 2010-07-10 ENCOUNTER — Ambulatory Visit: Admit: 2010-07-10 | Payer: Self-pay | Admitting: Pulmonary Disease

## 2010-07-10 ENCOUNTER — Ambulatory Visit
Admission: RE | Admit: 2010-07-10 | Discharge: 2010-07-10 | Payer: Self-pay | Source: Home / Self Care | Attending: Pulmonary Disease | Admitting: Pulmonary Disease

## 2010-07-24 ENCOUNTER — Ambulatory Visit
Admission: RE | Admit: 2010-07-24 | Discharge: 2010-07-24 | Payer: Self-pay | Source: Home / Self Care | Attending: Pulmonary Disease | Admitting: Pulmonary Disease

## 2010-08-07 ENCOUNTER — Encounter: Payer: Self-pay | Admitting: Pulmonary Disease

## 2010-08-07 ENCOUNTER — Ambulatory Visit: Admit: 2010-08-07 | Payer: Self-pay | Admitting: Pulmonary Disease

## 2010-08-07 ENCOUNTER — Ambulatory Visit (INDEPENDENT_AMBULATORY_CARE_PROVIDER_SITE_OTHER): Payer: MEDICARE

## 2010-08-07 DIAGNOSIS — J45909 Unspecified asthma, uncomplicated: Secondary | ICD-10-CM

## 2010-08-07 NOTE — Assessment & Plan Note (Signed)
SummaryJunius Dennis   Nurse Visit   Allergies: 1)  ! Pcn 2)  ! Sulfa 3)  ! Vancomycin 4)  ! Tetracycline 5)  ! * Iv Contrast  Medication Administration  Injection # 1:    Medication: Xolair (omalizumab) 150mg     Diagnosis: 493.00    Route: SQ    Site: L deltoid    Exp Date: 01/2013    Lot #: [id number]    Mfr: Salome Spotted    Comments: 1.0 ML X 2 IN LEFT AND 1.0 ML IN RIGHT ARM PT WAITED 30 MINS CHARGED J2357 AND 04540    Patient tolerated injection without complications    Given by: TAMMY SCOTT IN ALLERGY LAB   Medication Administration  Injection # 1:    Medication: Xolair (omalizumab) 150mg     Diagnosis: 493.00    Route: SQ    Site: L deltoid    Exp Date: 01/2013    Lot #: [id number]    Mfr: Salome Spotted    Comments: 1.0 ML X 2 IN LEFT AND 1.0 ML IN RIGHT ARM PT WAITED 30 MINS CHARGED J2357 AND 47829    Patient tolerated injection without complications    Given by: TAMMY SCOTT IN ALLERGY LAB

## 2010-08-07 NOTE — Assessment & Plan Note (Signed)
Summary: xolair 2 wks ///kp   Nurse Visit   Allergies: 1)  ! Pcn 2)  ! Sulfa 3)  ! Vancomycin 4)  ! Tetracycline 5)  ! * Iv Contrast  Medication Administration  Injection # 1:    Medication: Xolair (omalizumab) 150mg     Diagnosis: EXTRINSIC ASTHMA, UNSPECIFIED (ICD-493.00)    Route: IM    Site: R deltoid    Exp Date: 04/05/2013    Lot #: [id number]    Mfr: Salome Spotted    Comments: Xolair 375 mg and 90 units 1.0 ml x 2 in Right deltoid and 1.0 ml x 1 in left deltoid. Pt didn't w    Given by: Dimas Millin, Allergy tech  Orders Added: 1)  Xolair (omalizumab) 150mg  [J2357] 2)  Administration xolair injection [phone]

## 2010-08-07 NOTE — Assessment & Plan Note (Signed)
Summary: rov for severe asthma   Copy to:  [facility] Johnnell Liou/Referring Kahmari Koller:  Herb Grays, MD  CC:  Pt is here for a 4 month f/u appt on her asthma. Pt states she has "good days and bad days" regarding her breathing- will occ get sob with exertion.  Pt states she does cough up "opaque" colored sputum.  [name]  History of Present Illness: the pt comes in today for f/u of her known severe asthma both intrinsic and extrinsic.  She has maintained on xolair and an aggressive bronchodilator regimen, and has maintained a fairly stable functional baseline.  Some days are better than others, but she has not had a recent flare or pulmonary infection.    Current Medications (verified): 1)  Lasix 40 Mg  Tabs (Furosemide) .... Take 1 Tablet By Mouth Once A Day As Needed 2)  Advair Diskus 500-50 Mcg/dose  Misc (Fluticasone-Salmeterol) .[name].. 1 Puff Two Times A Day 3)  Xalatan 0.005 %  Soln (Latanoprost) .[name].. 1 Drop in Each Eye At Bedtime 4)  Digoxin 0.125 Mg Tabs (Digoxin) .... Take 1 Tablet By Mouth Once A Day 5)  Exforge 10-320 Mg  Tabs (Amlodipine Besylate-Valsartan) .... One Tablet Daily. 6)  Xolair 150 Mg  Solr (Omalizumab) .... 375 Mg Every Two Weeks 7)  Coumadin 2.5 Mg Tabs (Warfarin Sodium) .... Take As Directed By Coumadin Clinic 8)  Nasonex 50 Mcg/act Susp (Mometasone Furoate) .... Use 2 Sprays in Each Nostril Once Daily As Needed 9)  Combivent 103-18 Mcg/act Aero (Ipratropium-Albuterol) .... Use As Directed As Needed 10)  Xanax 0.25 Mg Tabs (Alprazolam) .... Take As Needed 11)  Welchol .... Take 1 Tablet By Mouth Once A Day 12)  Aspirin Ec Low Dose 81 Mg Tbec (Aspirin) .... Take 1 Tab By Mouth Every Other Day 13)  Coq10 200 Mg Caps (Coenzyme Q10) .... Take 1 Tablet By Mouth Once A Day  Allergies (verified): 1)  ! Pcn 2)  ! Sulfa 3)  ! Vancomycin 4)  ! Tetracycline 5)  ! * Iv Contrast  Review of Systems       The patient complains of shortness of breath with activity,  productive cough, irregular heartbeats, nasal congestion/difficulty breathing through nose, sneezing, hand/feet swelling, and joint stiffness or pain.  The patient denies shortness of breath at rest, non-productive cough, coughing up blood, chest pain, acid heartburn, indigestion, loss of appetite, weight change, abdominal pain, difficulty swallowing, sore throat, tooth/dental problems, headaches, itching, ear ache, anxiety, depression, rash, change in color of mucus, and fever.    Vital Signs:  Patient profile:   77 year old female Height:      67 inches Weight:      145.38 pounds O2 Sat:      96 % on Room air Temp:     97.9 degrees F oral Pulse rate:   66 / minute BP sitting:   138 / 70  (left arm) Cuff size:   regular  Vitals Entered By: Arman Filter LPN (March 12, 2010 10:30 AM)  O2 Flow:  Room air CC: Pt is here for a 4 month f/u appt on her asthma. Pt states she has "good days and bad days" regarding her breathing- will occ get sob with exertion.  Pt states she does cough up "opaque" colored sputum.   Comments Medications reviewed with patient Arman Filter LPN  March 12, 2010 10:30 AM    Physical Exam  General:  wd female in nad Nose:  no  discharge or purulence noted. Lungs:  mild basilar crackles, a few rhonchi, no wheezing Heart:  rrr, no mrg Extremities:  1+ LE edema, no cyanosis  Neurologic:  alert and oriented, moves all 4.   Impression & Recommendations:  Problem # 1:  ASTHMA (ICD-493.90)  the pt has severe asthma that has done reasonably well on her current regimen.  However, she has had increased variability in her symptoms, and I wonder if she may respond to singulair with more consistent control   Will give her a 4 week trial, but she is to stop if she doesn't see a big difference.  She is to continue on xolair.  I  also wonder how much volume overload may contribute to her symptom variability.  I have asked her to keep an eye on this.  Medications  Added to Medication List This Visit: 1)  Welchol  .... Take 1 tablet by mouth once a day 2)  Aspirin Ec Low Dose 81 Mg Tbec (Aspirin) .... Take 1 tab by mouth every other day 3)  Coq10 200 Mg Caps (Coenzyme q10) .... Take 1 tablet by mouth once a day  Other Orders: Est. Patient Level III ([address])  Patient Instructions: 1)  will try singulair 10mg  one each day for next 4 weeks.  If doesn't make a considerable difference, no reason to stay on.  If does make a difference, let me know. 2)  consider more consistent use of diuretic if having persistent swelling. 3)  followup with me in 4mos.

## 2010-08-07 NOTE — Assessment & Plan Note (Signed)
Summary: xolair/apc   Nurse Visit   Allergies: 1)  ! Pcn 2)  ! Sulfa 3)  ! Vancomycin 4)  ! Tetracycline 5)  ! * Iv Contrast  Medication Administration  Injection # 1:    Medication: Xolair (omalizumab) 150mg     Diagnosis: EXTRINSIC ASTHMA, UNSPECIFIED (ICD-493.00)    Route: IM    Site: R deltoid    Exp Date: 02/03/2013    Lot #: [id number]    Mfr: Salome Spotted    Comments: xolair 375 mg, 90 units 1.0 ml x 2 in right deltoid and 1.0 ml x 1 in left deltoid. Pt waited 20 mins.    Given by: Carver Fila, CMA  Injection # 2:    Medication:    Orders Added: 1)  Xolair (omalizumab) 150mg  [J2357] 2)  Administration xolair injection [phone]

## 2010-08-07 NOTE — Assessment & Plan Note (Signed)
Summary: xolair/apc   Nurse Visit   Allergies: 1)  ! Pcn 2)  ! Sulfa 3)  ! Vancomycin 4)  ! Tetracycline  Medication Administration  Injection # 1:    Medication: Xolair (omalizumab) 150mg     Diagnosis: EXTRINSIC ASTHMA, UNSPECIFIED (ICD-493.00)    Route: SQ    Site: R deltoid    Exp Date: 10/2012    Lot #: [id number]    Mfr: Mendel Ryder    Comments: Injection given by Dimas Millin in allergy lab. Xolair 375mg . 1.86ml x 2 in Right and 1.67ml x 1 in Left Deltoid. Pt waited 30 minutes.     Patient tolerated injection without complications  Orders Added: 1)  Admin of Therapeutic Inj  intramuscular or subcutaneous [96372] 2)  Xolair (omalizumab) 150mg  [J2357]

## 2010-08-07 NOTE — Letter (Signed)
Summary: New Patient letter  [facility]  [address] [place], [place] [address]   Phone: [phone]  Fax: [phone]       07/30/2009 MRN: [id number]  [facility] [address] [place], [place]  [address]  Dear Ms. [facility],  Welcome to the Gastroenterology Division at [facility].    You are scheduled to see Dr.  Russella Dar on 08/23/2009 at 10:15am on the 3rd floor at [facility], [address]  We ask that you try to arrive at our office 15 minutes prior to your appointment time to allow for check-in.  We would like you to complete the enclosed self-administered evaluation form prior to your visit and bring it with you on the day of your appointment.  We will review it with you.  Also, please bring a complete list of all your medications or, if you prefer, bring the medication bottles and we will list them.  Please bring your insurance card so that we may make a copy of it.  If your insurance requires a referral to see a specialist, please bring your referral form from your primary care physician.  Co-payments are due at the time of your visit and may be paid by cash, check or credit card.     Your office visit will consist of a consult with your physician (includes a physical exam), any laboratory testing he/she may order, scheduling of any necessary diagnostic testing (e.g. x-ray, ultrasound, CT-scan), and scheduling of a procedure (e.g. Endoscopy, Colonoscopy) if required.  Please allow enough time on your schedule to allow for any/all of these possibilities.    If you cannot keep your appointment, please call [phone] to cancel or reschedule prior to your appointment date.  This allows [place] the opportunity to schedule an appointment for another patient in need of care.  If you do not cancel or reschedule by 5 p.m. the business day prior to your appointment date, you will be charged a $50.00 late cancellation/no-show fee.    Thank you for  choosing [facility] for your medical needs.  We appreciate the opportunity to care for you.  Please visit [place] at our website  to learn more about our practice.                     Sincerely,                                                             The Gastroenterology Division

## 2010-08-07 NOTE — Assessment & Plan Note (Signed)
Summary: 2 WKS [name] ///kp   Nurse Visit   Allergies: 1)  ! Pcn 2)  ! Sulfa 3)  ! Vancomycin 4)  ! Tetracycline  Medication Administration  Injection # 1:    Medication: Xolair (omalizumab) 150mg     Diagnosis: EXTRINSIC ASTHMA, UNSPECIFIED (ICD-493.00)    Route: SQ    Site: L deltoid    Exp Date: 10/2012    Lot #: [id number]    Mfr: Mendel Ryder    Comments: Injection given by Dimas Millin in allergy lab. Xolair 375mg . 1.34ml x 2 in Left Deltoid and 1.6ml x 1 in Right Deltoid. Pt waited 30 minutes.     Patient tolerated injection without complications  Orders Added: 1)  Admin of Therapeutic Inj  intramuscular or subcutaneous [96372] 2)  Xolair (omalizumab) 150mg  [J2357]

## 2010-08-07 NOTE — Assessment & Plan Note (Signed)
Summary: XOLAIR///kp   Nurse Visit   Allergies: 1)  ! Pcn 2)  ! Sulfa 3)  ! Vancomycin 4)  ! Tetracycline 5)  ! * Iv Contrast  Medication Administration  Injection # 1:    Medication: Xolair (omalizumab) 150mg     Diagnosis: EXTRINSIC ASTHMA, UNSPECIFIED (ICD-493.00)    Route: SQ    Site: L deltoid    Exp Date: 11/03/2012    Lot #: [id number]    Mfr: GENENTECH    Comments: 1.0ML X 2 LEFT ARM AND 1.0ML X 1 R ARM PT WAITED    Patient tolerated injection without complications    Given by: SUSANNE FORD IN ALLERGY LAB  Orders Added: 1)  Xolair (omalizumab) 150mg  [J2357] 2)  Administration xolair injection [[id number]]   Medication Administration  Injection # 1:    Medication: Xolair (omalizumab) 150mg     Diagnosis: EXTRINSIC ASTHMA, UNSPECIFIED (ICD-493.00)    Route: SQ    Site: L deltoid    Exp Date: 11/03/2012    Lot #: [id number]    Mfr: GENENTECH    Comments: 1.0ML X 2 LEFT ARM AND 1.0ML X 1 R ARM PT WAITED    Patient tolerated injection without complications    Given by: SUSANNE FORD IN ALLERGY LAB  Orders Added: 1)  Xolair (omalizumab) 150mg  [J2357] 2)  Administration xolair injection [[id number]]

## 2010-08-07 NOTE — Assessment & Plan Note (Signed)
Summary: xolair/apc   Nurse Visit   Allergies: 1)  ! Pcn 2)  ! Sulfa 3)  ! Vancomycin 4)  ! Tetracycline  Medication Administration  Injection # 1:    Medication: Xolair (omalizumab) 150mg     Diagnosis: EXTRINSIC ASTHMA, UNSPECIFIED (ICD-493.00)    Route: SQ    Site: R deltoid    Exp Date: 11/03/2012    Lot #: [id number]    Mfr: GENENTECH    Comments: 1.0 ML X 2 IN RIGHT ARM AND 1.0ML IN LEFT ARM PT WAITED 30 MINS    Patient tolerated injection without complications    Given by: SUSANNE FORD IN ALLERGY LAB  Orders Added: 1)  Xolair (omalizumab) 150mg  [J2357] 2)  Administration xolair injection [[id number]]   Medication Administration  Injection # 1:    Medication: Xolair (omalizumab) 150mg     Diagnosis: EXTRINSIC ASTHMA, UNSPECIFIED (ICD-493.00)    Route: SQ    Site: R deltoid    Exp Date: 11/03/2012    Lot #: [id number]    Mfr: GENENTECH    Comments: 1.0 ML X 2 IN RIGHT ARM AND 1.0ML IN LEFT ARM PT WAITED 30 MINS    Patient tolerated injection without complications    Given by: SUSANNE FORD IN ALLERGY LAB  Orders Added: 1)  Xolair (omalizumab) 150mg  [J2357] 2)  Administration xolair injection [[id number]]

## 2010-08-07 NOTE — Assessment & Plan Note (Signed)
Summary: xolair/apc   Nurse Visit   Allergies: 1)  ! Pcn 2)  ! Sulfa 3)  ! Vancomycin 4)  ! Tetracycline 5)  ! * Iv Contrast  Medication Administration  Injection # 1:    Medication: Xolair (omalizumab) 150mg     Diagnosis: EXTRINSIC ASTHMA, UNSPECIFIED (ICD-493.00)    Route: IM    Site: R deltoid    Exp Date: 02/03/2013    Lot #: [id number]    Mfr: Salome Spotted    Comments: xolair 375 mg 90 units, 1.0 ml x 2 in right arm and 1.39ml x 1 in Left arm. Pt didn't wait    Given by: Tammy scott  Orders Added: 1)  Xolair (omalizumab) 150mg  [J2357] 2)  Administration xolair injection [id number]   Medication Administration  Injection # 1:    Medication: Xolair (omalizumab) 150mg     Diagnosis: EXTRINSIC ASTHMA, UNSPECIFIED (ICD-493.00)    Route: IM    Site: R deltoid    Exp Date: 02/03/2013    Lot #: [id number]    Mfr: Genetech    Comments: xolair 375 mg 90 units, 1.0 ml x 2 in right arm and 1.9ml x 1 in Left arm. Pt didn't wait    Given by: Tammy scott  Orders Added: 1)  Xolair (omalizumab) 150mg  [J2357] 2)  Administration xolair injection [phone]

## 2010-08-07 NOTE — Assessment & Plan Note (Signed)
Summary: discuss colonoscopy due to age/lk   History of Present Illness Visit Type: Initial Visit Primary GI MD: Elie Goody MD [facility] Primary Provider: Herb Grays, MD Chief Complaint: Discuss having Colonoscopy due to age History of Present Illness:   This is an 77 year old female who has noted a gradual loss of about 35 pounds over the past 2 years. She feels her weight has recently stabilized. She notes alternating loose stools with normal bowel movements for the past few years as well.  She has a prior history of adenomatous colon polyps, initially diagnosed in 1999.  For the past 2 years, she has been maintained on Coumadin anti-coagulation for atrial fibrillation.   GI Review of Systems    Reports weight loss.   Weight loss of 35 pounds   Denies abdominal pain, acid reflux, belching, bloating, chest pain, dysphagia with liquids, dysphagia with solids, heartburn, loss of appetite, nausea, vomiting, vomiting blood, and  weight gain.      Reports change in bowel habits and  hemorrhoids.     Denies anal fissure, black tarry stools, constipation, diarrhea, diverticulosis, fecal incontinence, heme positive stool, irritable bowel syndrome, jaundice, light color stool, liver problems, rectal bleeding, and  rectal pain.   Current Medications (verified): 1)  Lasix 40 Mg  Tabs (Furosemide) .... Take 1 Tablet By Mouth Once A Day As Needed 2)  Advair Diskus 500-50 Mcg/dose  Misc (Fluticasone-Salmeterol) .[name].. 1 Puff Two Times A Day 3)  Xalatan 0.005 %  Soln (Latanoprost) .[name].. 1 Drop in Each Eye At Bedtime 4)  Digoxin 0.125 Mg Tabs (Digoxin) .... Take 1 Tablet By Mouth Once A Day 5)  Exforge 10-320 Mg  Tabs (Amlodipine Besylate-Valsartan) .... One Tablet Daily. 6)  Xolair 150 Mg  Solr (Omalizumab) .... 375 Mg Every Two Weeks 7)  Coumadin 2.5 Mg Tabs (Warfarin Sodium) .... Take As Directed By Coumadin Clinic 8)  Nasonex 50 Mcg/act Susp (Mometasone Furoate) .... Use 2 Sprays in Each Nostril  Once Daily As Needed 9)  Combivent 103-18 Mcg/act Aero (Ipratropium-Albuterol) .... Use As Directed As Needed 10)  Crestor 20 Mg Tabs (Rosuvastatin Calcium) .... Take 2 Tablets Twice Weekly  Allergies (verified): 1)  ! Pcn 2)  ! Sulfa 3)  ! Vancomycin 4)  ! Tetracycline  Past History:  Past Medical History: Asthma GERD Coronary Artery Disease Glaucoma Hypertension Adenomatous Colon Polyps 02/1998 Diverticulosis Atrial Fibrillation  Past Surgical History: Appendectomy 1977 Hysterectomy 1979 Sinus surgery Percutaneous transluminal coronary angioplasty and stent placement 1996 Cholecystectomy  Family History: Reviewed history from 08/22/2009 and no changes required. Family History of Heart Disease: Brother, Father Family History of Colon Cancer: Brother, Mother  Social History: Reviewed history from 08/22/2009 and no changes required. 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  Alcohol Use - no Illicit Drug Use - no Divorced  1 boy 2 girls  Review of Systems       The patient complains of allergy/sinus, arthritis/joint pain, cough, itching, skin rash, and swelling of feet/legs.         The pertinent positives and negatives are noted as above and in the HPI. All other ROS were reviewed and were negative.   Vital Signs:  Patient profile:   77 year old female Height:      67 inches Weight:      142.50 pounds Pulse rate:   88 / minute Pulse rhythm:   regular BP sitting:   128 / 56  (left arm)  Vitals Entered By:  Milford Cage NCMA (August 23, 2009 10:06 AM)  Physical Exam  General:  Well developed, well nourished, no acute distress. Head:  Normocephalic and atraumatic. Eyes:  PERRLA, no icterus. Ears:  Normal auditory acuity. Mouth:  No deformity or lesions, dentition normal. Lungs:  Clear throughout to auscultation. Heart:  Regular rate and rhythm; no murmurs, rubs,  or bruits. Abdomen:  Soft, nontender and nondistended. No masses,  hepatosplenomegaly or hernias noted. Normal bowel sounds. Rectal:  deferred until time of colonoscopy.   Psych:  Alert and cooperative. Normal mood and affect.  Impression & Recommendations:  Problem # 1:  WEIGHT LOSS (ICD-783.21) Unexplained gradual weight loss of 35 pounds. She states her weight has recently stabilized. Rule out colorectal neoplasms. The risks, benefits and alternatives to colonoscopy with possible biopsy and possible polypectomy were discussed with the patient and they consent to proceed. The procedure will be scheduled electively. The risks, benefits, and alternatives to 5 day hold of Coumadin anticoagulation, were discussed with the patient she consents to proceed. Will obtain clearance from either her cardiologist to her primary care physician. Orders: Colonoscopy (Colon)  Problem # 2:  CHANGE IN BOWELS (ICD-787.99) Variation in bowel habits. As a problem #1. Orders: Colonoscopy (Colon)  Problem # 3:  COUMADIN THERAPY (ICD-V58.61) See problem #1.  Problem # 4:  COLONIC POLYPS, ADENOMATOUS, HX OF (ICD-V12.72) See problem #1.  Patient Instructions: 1)  Colonoscopy brochure given.  2)  Conscious Sedation brochure given.  3)  Copy sent to : Herb Grays, MD 4)  The medication list was reviewed and reconciled.  All changed / newly prescribed medications were explained.  A complete medication list was provided to the patient / caregiver.  Prescriptions: MOVIPREP 100 GM  SOLR (PEG-KCL-NACL-NASULF-NA ASC-C) As per prep instructions.  #1 x 0   Entered and Authorized by:   Meryl Dare MD [facility]   Signed by:   Meryl Dare MD FACG on 08/23/2009   Method used:   Electronically to        [facility]  [phone]* (retail)       [address]       [place], [place]  [address]       Ph: [phone] or [phone]       Fax: [phone]   RxID:   [id number]

## 2010-08-07 NOTE — Assessment & Plan Note (Signed)
Summary: xolair/ mbw   Nurse Visit   Allergies: 1)  ! Pcn 2)  ! Sulfa 3)  ! Vancomycin 4)  ! Tetracycline 5)  ! * Iv Contrast  Medication Administration  Injection # 1:    Medication: Xolair (omalizumab) 150mg     Diagnosis: EXTRINSIC ASTHMA, UNSPECIFIED (ICD-493.00)    Route: IM    Site: R deltoid    Exp Date: 04/05/2013    Lot #: [id number]    Mfr: Genetech    Comments: xolair 375 , units 90 , 1.0 ml x 2 in Left Arm and 1.0 ml x 1 in right arm. Pt didn't wait.     Given by: Tammy Scott, allergy tech  Orders Added: 1)  Xolair (omalizumab) 150mg  [J2357] 2)  Administration xolair injection [id number]   Medication Administration  Injection # 1:    Medication: Xolair (omalizumab) 150mg     Diagnosis: EXTRINSIC ASTHMA, UNSPECIFIED (ICD-493.00)    Route: IM    Site: R deltoid    Exp Date: 04/05/2013    Lot #: [id number]    Mfr: Genetech    Comments: xolair 375 , units 90 , 1.0 ml x 2 in Left Arm and 1.0 ml x 1 in right arm. Pt didn't wait.     Given by: Dimas Millin, allergy tech  Orders Added: 1)  Xolair (omalizumab) 150mg  [J2357] 2)  Administration xolair injection [phone]

## 2010-08-07 NOTE — Letter (Signed)
Summary: Anticoagulation Modification Letter  [facility]  [address] [place], [place] [address]   Phone: [phone]  Fax: [phone]    August 23, 2009  Re:    [name] [facility] DOB:    [birth year] MRN:    [id number]    Dear Dr. Mayford Knife,  We have scheduled the above patient for an endoscopic procedure. Our records show that  he/she is on anticoagulation therapy. Please advise as to how long the patient may come off their therapy of coumadin prior to the scheduled procedure(s) on 10/10/09.   Please fax back/or route the completed form to [facility] at [phone].  Thank you for your help with this matter.  Sincerely,  Christie Nottingham CMA Duncan Dull)   Physician Recommendation:  Hold Plavix 7 days prior ________________  Hold Coumadin 5 days prior ____________  Other ______________________________     Appended Document: Anticoagulation Modification Letter Told pt come off coumadin 5 days before procedure per Dr. Mayford Knife. Pt verbalized understanding.

## 2010-08-07 NOTE — Assessment & Plan Note (Signed)
Summary: rov for severe asthma   Copy to:  [facility] Primary Provider/Referring Provider:  Herb Grays  CC:  Pt is here for a 4 month f/u appt.  Pt states breathing is unchanged from last visit.  Pt states occ cough and will sometimes cough up clear sputum.  Pt states she recently had "sinus infect" dx by Dr. Collins Scotland but has since finished abx.  .  History of Present Illness: The pt comes in today for f/u of her known severe intrinsic and allergic asthma.  She has been doing fairly well on her current bronchodilator regimen and xolair, and feels her breathing is at her usual baseline.  She had a recent sinus infection that was treated by Dr. Collins Scotland.  Currently denies congestion or purulent mucus.  Current Medications (verified): 1)  Lasix 40 Mg  Tabs (Furosemide) .... Take 1 Tablet By Mouth Once A Day As Needed 2)  Advair Diskus 500-50 Mcg/dose  Misc (Fluticasone-Salmeterol) .[name].. 1 Puff Two Times A Day 3)  Xalatan 0.005 %  Soln (Latanoprost) .[name].. 1 Drop in Each Eye At Bedtime 4)  Digoxin 0.125 Mg Tabs (Digoxin) .... Take 1 Tablet By Mouth Once A Day 5)  Exforge 10-320 Mg  Tabs (Amlodipine Besylate-Valsartan) .... One Tablet Daily. 6)  Xolair 150 Mg  Solr (Omalizumab) .... 375 Mg Every Two Weeks 7)  Coumadin 2.5 Mg Tabs (Warfarin Sodium) .... Take As Directed By Coumadin Clinic 8)  Nasonex 50 Mcg/act Susp (Mometasone Furoate) .... Use 2 Sprays in Each Nostril Once Daily As Needed 9)  Combivent 103-18 Mcg/act Aero (Ipratropium-Albuterol) .... Use As Directed As Needed 10)  Zyrtec Allergy 10 Mg Caps (Cetirizine Hcl) .... Take 1 Tablet By Mouth Once A Day As Needed  Allergies (verified): 1)  ! Pcn 2)  ! Sulfa 3)  ! Vancomycin 4)  ! Tetracycline  Review of Systems      See HPI  Vital Signs:  Patient profile:   77 year old female Height:      67 inches Weight:      143.38 pounds O2 Sat:      92 % on Room air Temp:     97.5 degrees F oral Pulse rate:   84 / minute BP sitting:    120 / 72  (left arm) Cuff size:   regular  Vitals Entered By: Arman Filter LPN (July 11, 2009 9:58 AM)  O2 Flow:  Room air CC: Pt is here for a 4 month f/u appt.  Pt states breathing is unchanged from last visit.  Pt states occ cough and will sometimes cough up clear sputum.  Pt states she recently had "sinus infect" dx by Dr. Collins Scotland but has since finished abx.   Comments Medications reviewed with patient Arman Filter LPN  July 11, 2009 10:01 AM    Physical Exam  General:  wd female in nad Nose:  no drainage or purulence noted Lungs:  decreased bs, but no active wheezing. a few coarse bs noted in upper airway. Heart:  rrr, no mrg Extremities:  1+ edema bilat Neurologic:  alert and oriented, moves all 4.   Impression & Recommendations:  Problem # 1:  ASTHMA (ICD-493.90) the pt is maintaining a stable baseline on her current therapy, and therefore will make no changes.  I have asked her to stay as active as possible, and to continue on xolair.  Medications Added to Medication List This Visit: 1)  Nasonex 50 Mcg/act Susp (Mometasone furoate) .... Use 2  sprays in each nostril once daily as needed  Other Orders: Admin of Therapeutic Inj  intramuscular or subcutaneous (09811) Xolair (omalizumab) 150mg  (B1478) Est. Patient Level II (29562)  Patient Instructions: 1)  no change in asthma meds 2)  continue on xolair 3)  followup with me in 4mos.   Medication Administration  Injection # 1:    Medication: Xolair (omalizumab) 150mg     Diagnosis: EXTRINSIC ASTHMA, UNSPECIFIED (ICD-493.00)    Route: SQ    Site: R deltoid    Exp Date: 07/06/2012    Lot #: [id number]    Mfr: Mendel Ryder    Comments: Xolair 375mg  1.0 ml x 2 in L deltoid and 1.0 ml in R deltoid Given by Drucie Opitz, CMA in Allergy Lab. Pt was seen by MD after injection     Patient tolerated injection without complications  Orders Added: 1)  Admin of Therapeutic Inj  intramuscular or subcutaneous [96372] 2)   Xolair (omalizumab) 150mg  [J2357] 3)  Est. Patient Level II [78469]

## 2010-08-07 NOTE — Assessment & Plan Note (Signed)
Summary: xolair///kp   Nurse Visit   Allergies: 1)  ! Pcn 2)  ! Sulfa 3)  ! Vancomycin 4)  ! Tetracycline 5)  ! * Iv Contrast  Medication Administration  Injection # 1:    Medication: Xolair (omalizumab) 150mg     Diagnosis: EXTRINSIC ASTHMA, UNSPECIFIED (ICD-493.00)    Route: SQ    Site: R deltoid    Exp Date: 07/2013    Lot #: [id number]    Mfr: Genetech    Comments: 1.0 ML IN RIGHT AND LEFT ARM 375 MG CHARGED J2357 AND 57846    Patient tolerated injection without complications    Given by: TAMMY SCOTT IN ALLERGY LAB  Orders Added: 1)  Xolair (omalizumab) 150mg  [J2357] 2)  Administration xolair injection [96295]   Medication Administration  Injection # 1:    Medication: Xolair (omalizumab) 150mg     Diagnosis: EXTRINSIC ASTHMA, UNSPECIFIED (ICD-493.00)    Route: SQ    Site: R deltoid    Exp Date: 07/2013    Lot #: [id number]    Mfr: Genetech    Comments: 1.0 ML IN RIGHT AND LEFT ARM 375 MG CHARGED J2357 AND 44010    Patient tolerated injection without complications    Given by: TAMMY SCOTT IN ALLERGY LAB  Orders Added: 1)  Xolair (omalizumab) 150mg  [J2357] 2)  Administration xolair injection [27253]

## 2010-08-07 NOTE — Miscellaneous (Signed)
Summary: Injection Record / [facility]    Injection Record / [facility]    Imported By: Lennie Odor 11/25/2009 14:43:12  _____________________________________________________________________  External Attachment:    Type:   Image     Comment:   External Document

## 2010-08-07 NOTE — Assessment & Plan Note (Signed)
Summary: xolair//mbw   Nurse Visit   Allergies: 1)  ! Pcn 2)  ! Sulfa 3)  ! Vancomycin 4)  ! Tetracycline 5)  ! * Iv Contrast  Medication Administration  Injection # 1:    Medication: Xolair (omalizumab) 150mg     Diagnosis: EXTRINSIC ASTHMA, UNSPECIFIED (ICD-493.00)    Route: SQ    Site: R deltoid    Exp Date: 11/03/2012    Lot #: [id number]    Mfr: GENENTECH    Comments: 1.0 ML X 2 IN RIGHT ARM AND 1.0 ML IN LEFT PT WAITED 30 MINS ALSO CHARGED 94601X 3    Patient tolerated injection without complications    Given by: TAMMY SCOTT IN ALLERGY LAB   Medication Administration  Injection # 1:    Medication: Xolair (omalizumab) 150mg     Diagnosis: EXTRINSIC ASTHMA, UNSPECIFIED (ICD-493.00)    Route: SQ    Site: R deltoid    Exp Date: 11/03/2012    Lot #: [id number]    Mfr: GENENTECH    Comments: 1.0 ML X 2 IN RIGHT ARM AND 1.0 ML IN LEFT PT WAITED 30 MINS ALSO CHARGED 94601X 3    Patient tolerated injection without complications    Given by: TAMMY SCOTT IN ALLERGY LAB

## 2010-08-07 NOTE — Procedures (Signed)
Summary: Colonoscopy  Patient: [name] Note: All result statuses are Final unless otherwise noted.  Tests: (1) Colonoscopy (COL)   COL Colonoscopy           DONE     [facility]     [address]     [place], [place]  [address]           COLONOSCOPY PROCEDURE REPORT           PATIENT:  Janice, Dennis  MR#:  [id number]     BIRTHDATE:  1924-03-26, 77 yrs. old  GENDER:  female           ENDOSCOPIST:  Judie Petit T. Russella Dar, MD, [facility]           PROCEDURE DATE:  10/10/2009     PROCEDURE:  Colonoscopy with biopsy and snare polypectomy     ASA CLASS:  Class III     INDICATIONS:  1) weight loss  2) change in bowel habits  3)     follow-up of polyp, adenomatous polyps, 1999.           MEDICATIONS:   Fentanyl 100 mcg IV, Versed 10 mg IV           DESCRIPTION OF PROCEDURE:   After the risks benefits and     alternatives of the procedure were thoroughly explained, informed     consent was obtained.  Digital rectal exam was performed and     revealed no abnormalities.   The LB [id number] endoscope     was introduced through the anus and advanced to the cecum, which     was identified by both the appendix and ileocecal valve, without     limitations.  The quality of the prep was good, using MoviPrep.     The instrument was then slowly withdrawn as the colon was fully     examined.     <<PROCEDUREIMAGES>>           FINDINGS:  A sessile polyp was found at the splenic flexure. It     was 5 mm in size. Polyp was snared without cautery. Retrieval was     successful. A sessile polyp was found in the descending colon. It     was 3 mm in size. The polyp was removed using cold biopsy forceps.     Moderate diverticulosis was found in the sigmoid colon. A tortuous     sigmoid colon was noted.  This was otherwise a normal examination     of the colon.  Retroflexed views in the rectum revealed internal     hemorrhoids, small. The time to cecum =  7.5  minutes. The  scope     was then withdrawn (time =  9.33  min) from the patient and the     procedure completed.           COMPLICATIONS:  None           ENDOSCOPIC IMPRESSION:     1) 5 mm sessile polyp at the splenic flexure     2) 3 mm sessile polyp in the descending colon     3) Moderate diverticulosis in the sigmoid colon     4) Internal hemorrhoids           RECOMMENDATIONS:     1) Await pathology results     2) High fiber diet with liberal fluid intake.     3) No plans for future surveillance colonoscopy due  to age     77) Abd/pelvic CT scan for weight loss           Judie Petit T. Russella Dar, MD, [facility]           CC: Herb Grays, MD           n.     Rosalie DoctorVenita Lick. Stark at 10/10/2009 10:56 AM           Lu Duffel, [phone]  Note: An exclamation mark (!) indicates a result that was not dispersed into the flowsheet. Document Creation Date: 10/10/2009 10:57 AM _______________________________________________________________________  (1) Order result status: Final Collection or observation date-time: 10/10/2009 10:43 Requested date-time:  Receipt date-time:  Reported date-time:  Referring Physician:   Ordering Physician: Claudette Head [phone]) Specimen Source:  Source: Launa Grill Order Number: [id number] Lab site:

## 2010-08-07 NOTE — Assessment & Plan Note (Signed)
Summary: rov for severe asthma   Copy to:  [facility] Primary Provider/Referring Provider:  Herb Grays, MD  CC:  4 mth. f/u-sob same, cough same, and occass wheezing.  History of Present Illness: the pt comes in today for f/u of her known severe asthma.  She is on xolair as well as an aggressive bronchodilator regimen, and overall has done fairly well since getting the xolair injections.  She has not had an acute sick visit that I am aware of in quite some time.  She has chronic doe that I suspect is only partially attributable to her airflow obstruction.  She has been having an issue with fluid retention, and is on diuretics for this.  She states that she feels poorly, and really does not know why.  She has not had a recent CPX with her primary md.  She denies any chest congestion, and no purulent mucus.  Preventive Screening-Counseling & Management  Alcohol-Tobacco     Smoking Status: quit     Year Quit: 1967  Current Medications (verified): 1)  Lasix 40 Mg  Tabs (Furosemide) .... Take 1 Tablet By Mouth Once A Day As Needed 2)  Advair Diskus 500-50 Mcg/dose  Misc (Fluticasone-Salmeterol) .[name].. 1 Puff Two Times A Day 3)  Xalatan 0.005 %  Soln (Latanoprost) .[name].. 1 Drop in Each Eye At Bedtime 4)  Digoxin 0.125 Mg Tabs (Digoxin) .... Take 1 Tablet By Mouth Once A Day 5)  Exforge 10-320 Mg  Tabs (Amlodipine Besylate-Valsartan) .... One Tablet Daily. 6)  Xolair 150 Mg  Solr (Omalizumab) .... 375 Mg Every Two Weeks 7)  Coumadin 2.5 Mg Tabs (Warfarin Sodium) .... Take As Directed By Coumadin Clinic 8)  Nasonex 50 Mcg/act Susp (Mometasone Furoate) .... Use 2 Sprays in Each Nostril Once Daily As Needed 9)  Combivent 103-18 Mcg/act Aero (Ipratropium-Albuterol) .... Use As Directed As Needed 10)  Xanax 0.25 Mg Tabs (Alprazolam) .... Take As Needed 11)  Welchol .... Take 1 Tablet By Mouth Once A Day 12)  Aspirin Ec Low Dose 81 Mg Tbec (Aspirin) .... Take 1 Tab By Mouth Every Other Day 13)   Coq10 200 Mg Caps (Coenzyme Q10) .... Take 1 Tablet By Mouth Once A Day  Allergies (verified): 1)  ! Pcn 2)  ! Sulfa 3)  ! Vancomycin 4)  ! Tetracycline 5)  ! * Iv Contrast  Review of Systems       The patient complains of shortness of breath with activity, productive cough, irregular heartbeats, indigestion, weight change, and nasal congestion/difficulty breathing through nose.  The patient denies shortness of breath at rest, non-productive cough, coughing up blood, chest pain, acid heartburn, loss of appetite, abdominal pain, difficulty swallowing, sore throat, tooth/dental problems, headaches, sneezing, itching, ear ache, anxiety, depression, hand/feet swelling, joint stiffness or pain, rash, change in color of mucus, and fever.    Vital Signs:  Patient profile:   77 year old female Height:      67 inches Weight:      150.25 pounds O2 Sat:      95 % on Room air Temp:     97.7 degrees F oral Pulse rate:   65 / minute BP sitting:   130 / 60  (right arm) Cuff size:   regular  Vitals Entered By: Elray Buba RN (July 10, 2010 10:46 AM)  O2 Flow:  Room air CC: 4 mth. f/u-sob same,cough same, occass wheezing Is Patient Diabetic? No Comments Medications reviewed with patient ,phone # verifed.Cala Bradford  Kidd RN  July 10, 2010 10:46 AM    Physical Exam  General:  wd female in nad Lungs:  faint left basilar crackles, no wheezing good airflow Heart:  rrr Extremities:  1-2+ edema no calf tenderness no cyanosis Neurologic:  alert and oriented, moves all 4.   Impression & Recommendations:  Problem # 1:  ASTHMA (ICD-493.90) the pt's asthma seems to be under reasonable control at this time.  She really has seen a benefit from [place], and will continue.  She is to stay on her current bronchodilators as well.  I have stressed to her the importance of regular exercise of some type.  I suspect that a lot of her doe may be due to her fluid retention, and I have asked her to make an  apptm with her primary in order to discuss this issue and have a complete physical.  The pt states she will do so.  Will check a cxr today for completeness.  Other Orders: Est. Patient Level III ([address]) T-2 View CXR (71020TC) Xolair (omalizumab) 150mg  (U0454) Administration xolair injection [phone])  Patient Instructions: 1)  no change in meds 2)  will check cxr today, and will call you with the results 3)  get an apptm with Dr. Yehuda Budd for complete physical, discuss swelling in legs. 4)  followup with me in 4mos.   Medication Administration  Injection # 1:    Medication: Xolair (omalizumab) 150mg     Diagnosis: EXTRINSIC ASTHMA, UNSPECIFIED (ICD-493.00)    Route: SQ    Site: R deltoid    Exp Date: 07/2013    Lot #: [id number]    Mfr: Salome Spotted    Comments: 1.0ML X 2 IN RIGHT ARM AND 1.0 ML IN LEFT ARM 375 MG CHARGED J2357 AND 95621    Patient tolerated injection without complications    Given by: TAMMY SCOTT IN ALLERGY LAB  Orders Added: 1)  Est. Patient Level III [30865] 2)  T-2 View CXR [71020TC] 3)  Xolair (omalizumab) 150mg  [J2357] 4)  Administration xolair injection [78469]

## 2010-08-07 NOTE — Progress Notes (Signed)
Summary: handicap placcard  Phone Note Call from Patient Call back at Home Phone [phone]   Caller: Patient Call For: clance Summary of Call: pt dropped off handicapped placard for dr clance's signature. she says she could pick this up when she comes in for [facility] 07/10/10. (i have given this to mindy) Initial call taken by: Tivis Ringer, CNA,  June 26, 2010 11:56 AM  Follow-up for Phone Call        put paperwork in [facility] very important look at folder for him to fill out and sign once he returns to the office.  Aundra Millet Reynolds LPN  June 26, 2010 12:05 PM   Additional Follow-up for Phone Call Additional follow up Details #1::        done. Additional Follow-up by: Barbaraann Share MD,  July 02, 2010 6:20 PM     Appended Document: handicap placcard called and spoke with pt.  pt aware paperwork signed and is at front desk for pt to pick up.

## 2010-08-07 NOTE — Assessment & Plan Note (Signed)
Summary: xolair/ mbw   Nurse Visit   Allergies: 1)  ! Pcn 2)  ! Sulfa 3)  ! Vancomycin 4)  ! Tetracycline 5)  ! * Iv Contrast  Medication Administration  Injection # 1:    Medication: Xolair (omalizumab) 150mg     Diagnosis: EXTRINSIC ASTHMA, UNSPECIFIED (ICD-493.00)    Route: SQ    Site: L deltoid    Exp Date: 11/03/2012    Lot #: [id number]    Mfr: GENENTECH    Comments: 1.0 ML X 2 IN LEFT ARM AND 1.0 ML IN RIGHT ARM PT WAITED 30 MINS    Patient tolerated injection without complications    Given by: SUSANNE FORD IN ALLERGY LAB  Orders Added: 1)  Administration xolair injection [id number]   Medication Administration  Injection # 1:    Medication: Xolair (omalizumab) 150mg     Diagnosis: EXTRINSIC ASTHMA, UNSPECIFIED (ICD-493.00)    Route: SQ    Site: L deltoid    Exp Date: 11/03/2012    Lot #: [id number]    Mfr: GENENTECH    Comments: 1.0 ML X 2 IN LEFT ARM AND 1.0 ML IN RIGHT ARM PT WAITED 30 MINS    Patient tolerated injection without complications    Given by: SUSANNE FORD IN ALLERGY LAB  Orders Added: 1)  Administration xolair injection [phone]

## 2010-08-07 NOTE — Assessment & Plan Note (Signed)
Summary: xolair/apc   Nurse Visit   Allergies: 1)  ! Pcn 2)  ! Sulfa 3)  ! Vancomycin 4)  ! Tetracycline  Medication Administration  Injection # 1:    Medication: Xolair (omalizumab) 150mg     Diagnosis: EXTRINSIC ASTHMA, UNSPECIFIED (ICD-493.00)    Route: SQ    Site: L deltoid    Exp Date: 09/2012    Lot #: [id number]    Mfr: Mendel Ryder    Comments: Injection given by Dimas Millin in allergy lab. Xolair 375mg . 1.59ml x 2 in Left and 1.3ml x 1 in Right Deltoid. Pt waited 30 minutes.     Patient tolerated injection without complications  Orders Added: 1)  Admin of Therapeutic Inj  intramuscular or subcutaneous [96372] 2)  Xolair (omalizumab) 150mg  [J2357]

## 2010-08-07 NOTE — Miscellaneous (Signed)
Summary: Injection Record/[facility]  Injection Record/ [facility]   Imported By: Sherian Rein 12/26/2009 08:42:49  _____________________________________________________________________  External Attachment:    Type:   Image     Comment:   External Document

## 2010-08-07 NOTE — Procedures (Signed)
Summary: Colonoscopy   Colonoscopy  Procedure date:  11/19/2003  Findings:      Results: Diverticulosis.       Pathology:  Adenomatous polyp.        Location:  [facility].    Procedures Next Due Date:    Colonoscopy: 12/2008  Colonoscopy  Procedure date:  11/19/2003  Findings:      Results: Diverticulosis.       Pathology:  Adenomatous polyp.        Location:   Endoscopy Center.    Procedures Next Due Date:    Colonoscopy: 12/2008 Patient Name: Janice, Dennis MRN:  Procedure Procedures: Colonoscopy CPT: 21308.    with Hot Biopsy(s)CPT: [id number].  Personnel: Endoscopist: Venita Lick. Russella Dar, MD, [facility].  Exam Location: Exam performed in Outpatient Clinic. Outpatient  Patient Consent: Procedure, Alternatives, Risks and Benefits discussed, consent obtained, from patient. Consent was obtained by the RN.  Indications  Surveillance of: Adenomatous Polyp(s). Initial polypectomy was performed in 2002. in Jan.  History  Current Medications: Patient is not currently taking Coumadin.  Pre-Exam Physical: Performed Nov 19, 2003. Entire physical exam was normal.  Exam Exam: Extent of exam reached: Cecum, extent intended: Cecum.  The cecum was identified by appendiceal orifice and IC valve. Colon retroflexion performed. ASA Classification: II. Tolerance: excellent.  Monitoring: Pulse and BP monitoring, Oximetry used. Supplemental O2 given.  Colon Prep Used Miralax for colon prep. Prep results: good.  Sedation Meds: Patient assessed and found to be appropriate for moderate (conscious) sedation. Fentanyl 75 mcg. given IV. Versed 7 mg. given IV.  Findings POLYP: Descending Colon, Maximum size: 3 mm. sessile polyp. Procedure:  hot biopsy, removed, retrieved, Polyp sent to pathology. ICD9: Colon Polyps: 211.3.  DIVERTICULOSIS: Sigmoid Colon to Descending Colon. Not bleeding. ICD9: Diverticulosis, Colon: 562.10.  NORMAL EXAM: Cecum to Splenic  Flexure.  NORMAL EXAM: Rectum.   Assessment  Diagnoses: 562.10: Diverticulosis, Colon.  211.3: Colon Polyps.   Events  Unplanned Interventions: No intervention was required.  Plans  Post Exam Instructions: No aspirin or non-steroidal containing medications: 2 weeks.  Medication Plan: Await pathology. Continue current medications.  Patient Education: Patient given standard instructions for: Polyps. Diverticulosis.  Disposition: After procedure patient sent to recovery. After recovery patient sent home.  Scheduling/Referral: Colonoscopy, to [facility] T. Russella Dar, MD, [facility], around Nov 18, 2008.    This report was created from the original endoscopy report, which was reviewed and signed by the above listed endoscopist.    cc: Margrett Rud, MD

## 2010-08-07 NOTE — Miscellaneous (Signed)
Summary: CT scan abdomen /pelvis  patient notified of CT scan scheduled at [facility] for 10/16/09 10:30.  Patient  notified she will need to pick up her contrast, instructions, and prednisone RX from radiology dept.  Patient  also instructed to come here for lab work on Monday.  Patient  verbalized understanding.  Confirmed with patient her IV contrast allergy, she was given "dye" with prednisone and benadryl during a heart cath and had no problems.   Darcey Nora RN, [facility]  October 11, 2009 2:24 PM     Clinical Lists Changes  Orders: Added new Referral order of CT Abdomen/Pelvis with Contrast (CT Abd/Pelvis w/con) - Signed Allergies: Added new allergy or adverse reaction of * IV CONTRAST

## 2010-08-07 NOTE — Procedures (Signed)
Summary: HOLD/[facility]  HOLD/ Gastroenterology   Imported By: Lester [place] 08/28/2009 08:42:01  _____________________________________________________________________  External Attachment:    Type:   Image     Comment:   External Document

## 2010-08-07 NOTE — Procedures (Signed)
Summary: Colonoscopy and biopsy   Colonoscopy  Procedure date:  07/28/2000  Findings:      Results: Diverticulosis.       Pathology:  Adenomatous polyp.        Location:  [facility].    Procedures Next Due Date:    Colonoscopy: 08/2003 Patient Name: [name], [name] MRN:  Procedure Procedures: Colonoscopy CPT: 45409.    with Hot Biopsy(s)CPT: [id number].  Personnel: Endoscopist: Venita Lick. Russella Dar, MD, Clementeen Graham.  Referred By: Margrett Rud, MD.  Exam Location: Exam performed in Outpatient Clinic. Outpatient  Patient Consent: Procedure, Alternatives, Risks and Benefits discussed, consent obtained, from patient. Consent to be contacted was not given.  Indications Symptoms: Constipation Patient has difficulty evacuating, strains with stool passage.  Surveillance of: Adenomatous Polyp(s).  Increased Risk Screening: For family history of colorectal neoplasia, in  parent sibling  Comments: Mother in 22s and brother in 24s with colon ca. History  Pre-Exam Physical: Performed Jul 28, 2000. Cardio-pulmonary exam, Rectal exam, HEENT exam , Abdominal exam, Extremity exam, Neurological exam, Mental status exam WNL.  Exam Exam: Extent of exam reached: Cecum, extent intended: Ascending Colon.  The cecum was identified by appendiceal orifice and IC valve. Colon retroflexion performed. Images taken. ASA Classification: II. Tolerance: excellent.  Monitoring: Pulse and BP monitoring, Oximetry used. Supplemental O2 given.  Colon Prep Used Golytely for colon prep. Dose Used: 4L. Prep results: good.  Sedation Meds: Fentanyl 75 mcg. Versed 5 mg.  Findings MULTIPLE POLYPS: Transverse Colon. minimum size 4 mm, maximum size 7 mm. Procedure:  hot biopsy, removed, Polyp retrieved, 2 polyps Polyps sent to pathology. ICD9: Colon Polyps: 211.3.  DIVERTICULOSIS: Sigmoid Colon to Descending Colon. Not bleeding. ICD9: Diverticulosis: 562.10.  POLYP: Descending Colon, Maximum  size: 5 mm. sessile polyp. Procedure:  hot biopsy, removed, retrieved, Polyp sent to pathology.   Assessment Abnormal examination, see findings above.  Diagnoses: 211.3: Colon Polyps.  562.10: Diverticulosis.   Events  Unplanned Interventions: No intervention was required.  Unplanned Events: There were no complications. Plans  Post Exam Instructions: No aspirin or non-steroidal containing medications: 2 weeks.  Medication Plan: Await pathology. Continue current medications.  Patient Education: Patient given standard instructions for: Polyps. Diverticulosis. Constipation.Patient instructed to get routine colonoscopy every 3 years.  Disposition: After procedure patient sent to recovery. After recovery patient sent home.  Scheduling/Referral: Colonoscopy, to [facility] T. Russella Dar, MD, [facility], around Jul 29, 2003.  Referring provider, to Margrett Rud, MD, Jul 28, 2000.   Comments: Gyn evaluation, if not recently done. This report was created from the original endoscopy report, which was reviewed and signed by the above listed endoscopist.    cc: Margrett Rud, MD        SP Surgical Pathology - STATUS: Final             By: Guilford Shi MD , Clovis Pu       Perform Date: 23Jan02 14:16  Ordered By: Rica Records Date:  Facility: [facility]                              Department: CPATH  Service Report Text  [facility]   [address] [place], [place] [address]   [phone]    REPORT OF SURGICAL PATHOLOGY    Case #: [id number]   Patient Name: [name]   PID: [id number]  Pathologist: Marcie Bal, MD   DOB/Age [birth year] (Age: 39) Gender: F   Date Taken: 07/28/2000   Date Received: 07/28/2000    FINAL DIAGNOSIS    ***MICROSCOPIC EXAMINATION AND DIAGNOSIS***    FRAGMENTS OF TUBULAR ADENOMA AND HYPEPLASTIC POLYP.    ab   Date Reported: 07/29/2000 Marcie Bal, MD   *** Electronically Signed Out By  TAZ ***    Clinical information   Constipation,R/O adenoma.    specimen(s) obtained   Polyps of transverse and descending colon    Gross Description   Polyps of transverse and descending colon:   Received in formalin are tan, soft tissue fragments that are   submitted in toto. Number: 2   Size: 0.3 cm each (JBM:atb 1/23)    ab/

## 2010-08-07 NOTE — Assessment & Plan Note (Signed)
Summary: Gastroentoerology      HEALTHCARE   OFFICE NOTE  NAME:  [name], [name]          OFFICE NO:  [id number]    DATE:  07/22/00     REFERRING PHYSICIAN:  Margrett Rud, M.D.  REASON FOR EVALUATION:  [name] relates worsening constipation over the past year associated with significant difficulty in defecating and straining.  She has noted no rectal bleeding and no change in stool caliber.  She has had persistent problems with sinus drainage and an occasional cough.  She did undergo an ethmoidectomy for chronic sinus problems in July of last year and her symptoms have improved significantly since that time.  Her ENT physician felt she might have a component of reflux leading to her coughing and a knot in the throat sensation.  She was treated with one month of Nexium with no improvement in her symptoms.  She does note rare episodes of heartburn, but does not have typical reflux symptoms.  She notes no dysphagia or odynophagia.  MEDICATIONS:  As per the front of the chart, updated and reviewed.  ALLERGIES:  Penicillin, sulfa drugs, tetracycline.  PHYSICAL EXAMINATION:  The patient is a somewhat anxious elderly white female.  Weight 171 pounds, blood pressure 170/94, pulse 84 and regular.  HEENT:  Anicteric sclerae.  Oropharynx clear.  Chest:  Clear to auscultation and percussion.  Cardiac:  Regular rate and rhythm without murmurs.  Abdomen:  Soft, nontender, nondistended.  Normoactive bowel sounds.  No palpable organomegaly, masses, or hernias.    ASSESSMENT AND PLAN:   1.  Change in bowel habits with significant constipation and difficulty defecating.  There is a strong family history of colon cancer in her brother and mother along with a personal history of adenomatous colon polyps.  Plan:  Begin Citrucel supplements 2 tablespoons a day along with a high fiber diet and adequate fluid intake.  The risks, benefits, and alternatives to colonoscopy were discussed with the  patient. She agrees to proceed.   2.  Intermittent cough and globus sensation, likely related to ENT disorder.  Will begin a trial of Nexium 40 mg q.d. for 2 months.  Consider upper endoscopy and 24-hour dual-probe ambulatory pH monitor in the future if further evaluation is needed.    Venita Lick. Pleas Koch., M.D., F.A.C.G.  [id number] cc:  Margrett Rud, M.D. D: 07/22/00; T: 07/23/00; Job # [id number]

## 2010-08-07 NOTE — Assessment & Plan Note (Signed)
Summary: [name] //KP   Nurse Visit   Allergies: 1)  ! Pcn 2)  ! Sulfa 3)  ! Vancomycin 4)  ! Tetracycline 5)  ! * Iv Contrast  Medication Administration  Injection # 1:    Medication: Xolair (omalizumab) 150mg     Diagnosis: EXTRINSIC ASTHMA, UNSPECIFIED (ICD-493.00)    Route: SQ    Site: L deltoid    Exp Date: 07/2013    Lot #: [id number]    Mfr: Genetech    Comments: 1.0ML X 2 IN LEFT AND 1.0 ML X IN RIGHT 375MG  PT WAITED 15 MINS CHARGED J2357 AND 25366    Patient tolerated injection without complications    Given by: SUSANNE FORD IN ALLERGY LAB  Orders Added: 1)  Xolair (omalizumab) 150mg  [J2357] 2)  Administration xolair injection [44034]   Medication Administration  Injection # 1:    Medication: Xolair (omalizumab) 150mg     Diagnosis: EXTRINSIC ASTHMA, UNSPECIFIED (ICD-493.00)    Route: SQ    Site: L deltoid    Exp Date: 07/2013    Lot #: [id number]    Mfr: Genetech    Comments: 1.0ML X 2 IN LEFT AND 1.0 ML X IN RIGHT 375MG  PT WAITED 15 MINS CHARGED J2357 AND 63875    Patient tolerated injection without complications    Given by: SUSANNE FORD IN ALLERGY LAB  Orders Added: 1)  Xolair (omalizumab) 150mg  [J2357] 2)  Administration xolair injection [64332]

## 2010-08-07 NOTE — Assessment & Plan Note (Signed)
Summary: xolair/apc   Nurse Visit   Allergies: 1)  ! Pcn 2)  ! Sulfa 3)  ! Vancomycin 4)  ! Tetracycline 5)  ! * Iv Contrast  Medication Administration  Injection # 1:    Medication: Xolair (omalizumab) 150mg     Diagnosis: 493.00    Route: SQ    Site: R deltoid    Exp Date: 01/2013    Lot #: [id number]    Mfr: Salome Spotted    Comments: 1.0 ML X 2 IN RIGHT ARM AND 1.0 ML IN LEFT CHARGED 96401 AND J 2357    Patient tolerated injection without complications    Given by: TAMMY SCOTT IN ALLERGY LAB   Medication Administration  Injection # 1:    Medication: Xolair (omalizumab) 150mg     Diagnosis: 493.00    Route: SQ    Site: R deltoid    Exp Date: 01/2013    Lot #: [id number]    Mfr: Salome Spotted    Comments: 1.0 ML X 2 IN RIGHT ARM AND 1.0 ML IN LEFT CHARGED 96401 AND J 2357    Patient tolerated injection without complications    Given by: TAMMY SCOTT IN ALLERGY LAB

## 2010-08-07 NOTE — Assessment & Plan Note (Signed)
Summary: [name] ///kp   Nurse Visit   Allergies: 1)  ! Pcn 2)  ! Sulfa 3)  ! Vancomycin 4)  ! Tetracycline 5)  ! * Iv Contrast  Medication Administration  Injection # 1:    Medication: Xolair (omalizumab) 150mg     Diagnosis: EXTRINSIC ASTHMA, UNSPECIFIED (ICD-493.00)    Route: SQ    Site: R deltoid    Exp Date: 07/2013    Lot #: [id number]    Mfr: Genetech    Comments: 1.0 ML X 2 IN RIGHT AND 1.0 ML IN LEFT 375 MG CHARGED J2357 AND 04540    Given by: Drucie Opitz IN ALLERGY LAB  Orders Added: 1)  Xolair (omalizumab) 150mg  [J2357] 2)  Administration xolair injection [98119]   Medication Administration  Injection # 1:    Medication: Xolair (omalizumab) 150mg     Diagnosis: EXTRINSIC ASTHMA, UNSPECIFIED (ICD-493.00)    Route: SQ    Site: R deltoid    Exp Date: 07/2013    Lot #: [id number]    Mfr: Genetech    Comments: 1.0 ML X 2 IN RIGHT AND 1.0 ML IN LEFT 375 MG CHARGED J2357 AND 56213    Given by: Lynnae Sandhoff FORD IN ALLERGY LAB  Orders Added: 1)  Xolair (omalizumab) 150mg  [J2357] 2)  Administration xolair injection [08657]

## 2010-08-07 NOTE — Assessment & Plan Note (Signed)
Summary: FLU SHOT/ MBW   Nurse Visit   Allergies: 1)  ! Pcn 2)  ! Sulfa 3)  ! Vancomycin 4)  ! Tetracycline 5)  ! * Iv Contrast  Orders Added: 1)  Flu Vaccine 66yrs + MEDICARE PATIENTS [Q2039] 2)  Administration Flu vaccine - MCR [G0008] Flu Vaccine Consent Questions     Do you have a history of severe allergic reactions to this vaccine? no    Any prior history of allergic reactions to egg and/or gelatin? no    Do you have a sensitivity to the preservative Thimersol? no    Do you have a past history of Guillan-Barre Syndrome? no    Do you currently have an acute febrile illness? no    Have you ever had a severe reaction to latex? no    Vaccine information given and explained to patient? yes    Are you currently pregnant? no    Lot Number:[id number]   Exp Date:01/03/2011   Site Given  Left Deltoid IM Clarise [facility])  April 24, 2010 11:39 AM

## 2010-08-07 NOTE — Miscellaneous (Signed)
Summary: Injection Record / [facility]    Injection Record / [facility]    Imported By: Lennie Odor 11/25/2009 15:30:41  _____________________________________________________________________  External Attachment:    Type:   Image     Comment:   External Document

## 2010-08-07 NOTE — Assessment & Plan Note (Signed)
Summary: [name] ///KP   Nurse Visit   Allergies: 1)  ! Pcn 2)  ! Sulfa 3)  ! Vancomycin 4)  ! Tetracycline 5)  ! * Iv Contrast  Medication Administration  Injection # 1:    Medication: Xolair (omalizumab) 150mg     Diagnosis: EXTRINSIC ASTHMA, UNSPECIFIED (ICD-493.00)    Route: SQ    Site: L deltoid    Exp Date: 04/2013    Lot #: [id number]    Mfr: Salome Spotted    Comments: 1.0 ML X 2 IN LEFT AND 1.0 ML IN RIGHT 375MG  PT DIDNT WAIT CHARGED 96401 AND J2357    Given by: Dimas Millin IN ALLERGY LAB   Medication Administration  Injection # 1:    Medication: Xolair (omalizumab) 150mg     Diagnosis: EXTRINSIC ASTHMA, UNSPECIFIED (ICD-493.00)    Route: SQ    Site: L deltoid    Exp Date: 04/2013    Lot #: [id number]    Mfr: Genetech    Comments: 1.0 ML X 2 IN LEFT AND 1.0 ML IN RIGHT 375MG  PT DIDNT WAIT CHARGED 96401 AND J2357    Given by: TAMMY SCOTT IN ALLERGY LAB

## 2010-08-07 NOTE — Assessment & Plan Note (Signed)
Summary: 2 WKS Geoffry Paradise ///KP   Nurse Visit   Allergies: 1)  ! Pcn 2)  ! Sulfa 3)  ! Vancomycin 4)  ! Tetracycline 5)  ! * Iv Contrast  Medication Administration  Injection # 1:    Medication: Xolair (omalizumab) 150mg     Diagnosis: EXTRINSIC ASTHMA, UNSPECIFIED (ICD-493.00)    Route: SQ    Site: R deltoid    Exp Date: 11/03/2012    Lot #: [id number]    Mfr: GENENTECH    Comments: 1.0 ML X 2 IN RIGHT ARM AND 1.0 ML IN LEFT ARM PT WAITED 30 MINS    Patient tolerated injection without complications    Given by: TAMMY SCOTT IN ALLERGY LAB  Orders Added: 1)  Xolair (omalizumab) 150mg  [J2357] 2)  Administration xolair injection [21308]   Medication Administration  Injection # 1:    Medication: Xolair (omalizumab) 150mg     Diagnosis: EXTRINSIC ASTHMA, UNSPECIFIED (ICD-493.00)    Route: SQ    Site: R deltoid    Exp Date: 11/03/2012    Lot #: [id number]    Mfr: GENENTECH    Comments: 1.0 ML X 2 IN RIGHT ARM AND 1.0 ML IN LEFT ARM PT WAITED 30 MINS    Patient tolerated injection without complications    Given by: TAMMY SCOTT IN ALLERGY LAB  Orders Added: 1)  Xolair (omalizumab) 150mg  [J2357] 2)  Administration xolair injection [96295]

## 2010-08-07 NOTE — Procedures (Signed)
Summary: [facility]   Imported By: Sherian Rein 08/23/2009 14:59:06  _____________________________________________________________________  External Attachment:    Type:   Image     Comment:   External Document

## 2010-08-07 NOTE — Assessment & Plan Note (Signed)
Summary: xolair/mhh   Nurse Visit   Allergies: 1)  ! Pcn 2)  ! Sulfa 3)  ! Vancomycin 4)  ! Tetracycline 5)  ! * Iv Contrast  Medication Administration  Injection # 1:    Medication: Xolair (omalizumab) 150mg     Diagnosis: EXTRINSIC ASTHMA, UNSPECIFIED (ICD-493.00)    Route: SQ    Site: R deltoid    Exp Date: 04/2013    Lot #: [id number]    Mfr: Salome Spotted    Comments: X 2 IN RIGHT ARM AND 1 ML IN LEFT ARM 375 MG PT DIDNT WAIT CHARGED J2357 AND 35573 PER ENCOUNTER    Patient tolerated injection without complications    Given by: Kandice Hams IN ALLERGY LAB  Orders Added: 1)  Xolair (omalizumab) 150mg  [J2357] 2)  Administration xolair injection [22025]   Medication Administration  Injection # 1:    Medication: Xolair (omalizumab) 150mg     Diagnosis: EXTRINSIC ASTHMA, UNSPECIFIED (ICD-493.00)    Route: SQ    Site: R deltoid    Exp Date: 04/2013    Lot #: [id number]    Mfr: Salome Spotted    Comments: X 2 IN RIGHT ARM AND 1 ML IN LEFT ARM 375 MG PT DIDNT WAIT CHARGED J2357 AND 37628 PER ENCOUNTER    Patient tolerated injection without complications    Given by: ALIDA ROGERS IN ALLERGY LAB  Orders Added: 1)  Xolair (omalizumab) 150mg  [J2357] 2)  Administration xolair injection [31517]

## 2010-08-07 NOTE — Assessment & Plan Note (Signed)
Summary: xolair///kp   Nurse Visit   Allergies: 1)  ! Pcn 2)  ! Sulfa 3)  ! Vancomycin 4)  ! Tetracycline  Medication Administration  Injection # 1:    Medication: Xolair (omalizumab) 150mg     Diagnosis: EXTRINSIC ASTHMA, UNSPECIFIED (ICD-493.00)    Route: SQ    Site: R deltoid    Exp Date: 07/2012    Lot #: [id number]    Mfr: Mendel Ryder    Comments: Injection given by Dimas Millin in allergy lab. Xolair 375mg . 1.40ml x 2 in Right and 1.34m x 1 in Left Deltoid. Pt waited 30 minutes.     Patient tolerated injection without complications  Orders Added: 1)  Admin of Therapeutic Inj  intramuscular or subcutaneous [96372] 2)  Xolair (omalizumab) 150mg  [J2357]

## 2010-08-07 NOTE — Assessment & Plan Note (Signed)
Summary: xolair/mhh   Nurse Visit   Allergies: 1)  ! Pcn 2)  ! Sulfa 3)  ! Vancomycin 4)  ! Tetracycline 5)  ! * Iv Contrast  Medication Administration  Injection # 1:    Medication: Xolair (omalizumab) 150mg     Diagnosis: EXTRINSIC ASTHMA, UNSPECIFIED (ICD-493.00)    Route: SQ    Site: L deltoid    Exp Date: 11/03/2012    Lot #: [id number]    Mfr: Salome Spotted    Comments: 1.0 ML X 2 IN LEFT ARM AND 1.0 ML IN RIGHT PT WAITED 30 MINS CHARGED J2357 X 90 AND 81191 X 3    Patient tolerated injection without complications    Given by: TAMMY SCOTT IN ALLERGY LAB   Medication Administration  Injection # 1:    Medication: Xolair (omalizumab) 150mg     Diagnosis: EXTRINSIC ASTHMA, UNSPECIFIED (ICD-493.00)    Route: SQ    Site: L deltoid    Exp Date: 11/03/2012    Lot #: [id number]    Mfr: Salome Spotted    Comments: 1.0 ML X 2 IN LEFT ARM AND 1.0 ML IN RIGHT PT WAITED 30 MINS CHARGED J2357 X 90 AND 62130 X 3    Patient tolerated injection without complications    Given by: TAMMY SCOTT IN ALLERGY LAB

## 2010-08-07 NOTE — Assessment & Plan Note (Signed)
Summary: [name]   Nurse Visit   Allergies: 1)  ! Pcn 2)  ! Sulfa 3)  ! Vancomycin 4)  ! Tetracycline 5)  ! * Iv Contrast  Medication Administration  Injection # 1:    Medication: Xolair (omalizumab) 150mg     Diagnosis: EXTRINSIC ASTHMA, UNSPECIFIED (ICD-493.00)    Route: IM    Site: R deltoid    Exp Date: 04/05/2013    Lot #: [id number]    Mfr: Salome Spotted    Comments: xolair 375 mg, 90 units, 1.0 ml x 2 in left deltoid, 1.0 ml x 1 in right deltoid. pt waited 10 mins    Given by: Drucie Opitz, CMA  Orders Added: 1)  Xolair (omalizumab) 150mg  [J2357] 2)  Administration xolair injection [phone]

## 2010-08-07 NOTE — Letter (Signed)
Summary: Patient Notice- Polyp Results  [facility]  [address] [place], [place] [address]   Phone: [phone]  Fax: [phone]        October 11, 2009 MRN: [id number]    [facility] [address] [place], [place]  [address]    Dear [name],  I am pleased to inform you that the colon polyp(s) removed during your recent colonoscopy was (were) found to be benign (no cancer detected) upon pathologic examination.  Should you develop new or worsening symptoms of abdominal pain, bowel habit changes or bleeding from the rectum or bowels, please schedule an evaluation with either your primary care physician or with me.  Continue treatment plan as outlined the day of your exam.  Please call us if you are having persistent problems or have questions about your condition that have not been fully answered at this time.  Sincerely,  Janice Dare MD [facility]  This letter has been electronically signed by your physician.  Appended Document: Patient Notice- Polyp Results letter mailed 4.14.11

## 2010-08-07 NOTE — Assessment & Plan Note (Signed)
Summary: 2 wks xolair///kp   Nurse Visit   Allergies: 1)  ! Pcn 2)  ! Sulfa 3)  ! Vancomycin 4)  ! Tetracycline  Medication Administration  Injection # 1:    Medication: Xolair (omalizumab) 150mg     Diagnosis: EXTRINSIC ASTHMA, UNSPECIFIED (ICD-493.00)    Route: SQ    Site: R deltoid    Exp Date: 09/2012    Lot #: [id number]    Mfr: Mendel Ryder    Comments: Injection given by Dimas Millin in allergy lab. Xolair 375mg . 1.73ml x 2 in Right and 1.92ml x 1 in Left Deltoid. Pt waited 30 minutes.     Patient tolerated injection without complications  Orders Added: 1)  Admin of Therapeutic Inj  intramuscular or subcutaneous [96372] 2)  Xolair (omalizumab) 150mg  [J2357]

## 2010-08-07 NOTE — Assessment & Plan Note (Signed)
Summary: rov for severe asthma   Copy to:  [place]/[facility] Primary Provider/Referring Provider:  Herb Grays, MD  CC:  Pt is here for a 4 month f/u appt.  Pt  states she has "good days and bad days" regarding her breathing.  Pt states occ cough with clear sputum.  Pt requesting refill on Combivent.  .  History of Present Illness: the pt comes in today for f/u of her severe asthma.  She is doing as well as can be expected on her current  regimen, and continues on xolair injection as well.  She has not had any acute exacerbation since the last visit, and no recent pulmonary infections.  She has occasional cough, but no signficant mucus or chest congestion.    Current Medications (verified): 1)  Lasix 40 Mg  Tabs (Furosemide) .... Take 1 Tablet By Mouth Once A Day As Needed 2)  Advair Diskus 500-50 Mcg/dose  Misc (Fluticasone-Salmeterol) .[name].. 1 Puff Two Times A Day 3)  Xalatan 0.005 %  Soln (Latanoprost) .[name].. 1 Drop in Each Eye At Bedtime 4)  Digoxin 0.125 Mg Tabs (Digoxin) .... Take 1 Tablet By Mouth Once A Day 5)  Exforge 10-320 Mg  Tabs (Amlodipine Besylate-Valsartan) .... One Tablet Daily. 6)  Xolair 150 Mg  Solr (Omalizumab) .... 375 Mg Every Two Weeks 7)  Coumadin 2.5 Mg Tabs (Warfarin Sodium) .... Take As Directed By Coumadin Clinic 8)  Nasonex 50 Mcg/act Susp (Mometasone Furoate) .... Use 2 Sprays in Each Nostril Once Daily As Needed 9)  Combivent 103-18 Mcg/act Aero (Ipratropium-Albuterol) .... Use As Directed As Needed 10)  Xanax 0.25 Mg Tabs (Alprazolam) .... Take As Needed  Allergies (verified): 1)  ! Pcn 2)  ! Sulfa 3)  ! Vancomycin 4)  ! Tetracycline 5)  ! * Iv Contrast  Review of Systems      See HPI  Vital Signs:  Patient profile:   77 year old female Height:      67 inches Weight:      146 pounds BMI:     22.95 O2 Sat:      96 % on Room air Temp:     97.5 degrees F oral Pulse rate:   86 / minute BP sitting:   140 / 52  (right arm) Cuff size:    regular  Vitals Entered By: Arman Filter LPN (Nov 09, 8411 10:43 AM)  O2 Flow:  Room air CC: Pt is here for a 4 month f/u appt.  Pt  states she has "good days and bad days" regarding her breathing.  Pt states occ cough with clear sputum.  Pt requesting refill on Combivent.   Comments Medications reviewed with patient Arman Filter LPN  Nov 09, 2438 10:43 AM    Physical Exam  General:  wd female in nad Lungs:  mild decreased bs, no significant wheezing Heart:  rrr, no mrg Extremities:  1+ ankle edema bilat, no cyanosis Neurologic:  alert and oriented, moves all 4.   Impression & Recommendations:  Problem # 1:  ASTHMA (ICD-493.90) the pt is doing about as well as can be expected with her chronic fixed asthma with extrinsic component.  She is complaint on xolair and her bronchodilator regimen.  She has not had a recent exacerbation, but is having a lot of allergy issues currently.  Her breathing is at her usual baseline.  Medications Added to Medication List This Visit: 1)  Xanax 0.25 Mg Tabs (Alprazolam) .... Take as needed  Other Orders: Est. Patient Level II (13244) Prescription Created Electronically [phone]) Xolair (omalizumab) 150mg  (O5366) Admin of Therapeutic Inj  intramuscular or subcutaneous (44034)  Patient Instructions: 1)  no change in meds. 2)  ok to get your xolair today 3)  followup with me in 4mos.   Prescriptions: COMBIVENT 103-18 MCG/ACT AERO (IPRATROPIUM-ALBUTEROL) Use as directed as needed  #1 x 5   Entered and Authorized by:   Barbaraann Share MD   Signed by:   Barbaraann Share MD on 11/08/2009   Method used:   Electronically to        [facility]  [phone]* (retail)       [address]       [place], [place]  [address]       Ph: [phone] or [phone]       Fax: [phone]   RxID:   [id number]    Medication Administration  Injection # 1:    Medication: Xolair (omalizumab) 150mg     Diagnosis:  EXTRINSIC ASTHMA, UNSPECIFIED (ICD-493.00)    Route: SQ    Site: R deltoid    Exp Date: 11/03/2012    Lot #: [id number]    Mfr: Mendel Ryder    Comments: Xolair 375mg  1.0 ml x 2 injections given in R deltoid and 1.0 ml given in L deltoid. Given by Drucie Opitz, CMA in allergy lab    Patient tolerated injection without complications  Orders Added: 1)  Est. Patient Level II [17616] 2)  Prescription Created Electronically [G8553] 3)  Xolair (omalizumab) 150mg  [J2357] 4)  Admin of Therapeutic Inj  intramuscular or subcutaneous [07371]

## 2010-08-07 NOTE — Letter (Signed)
Summary: [facility] Instructions  [facility]  [address] [place], [place] [address]   Phone: [phone]  Fax: [phone]       [facility]    [birth year]    MRN: [id number]        Procedure Day /Date: Thursday April 7th, 2011     Arrival Time: 9:30am     Procedure Time: 10:30am     Location of Procedure:                    _ x_  [facility] (4th Floor)                        PREPARATION FOR COLONOSCOPY WITH MOVIPREP   Starting 5 days prior to your procedure  10/05/09 do not eat nuts, seeds, popcorn, corn, beans, peas,  salads, or any raw vegetables.  Do not take any fiber supplements (e.g. Metamucil, Citrucel, and Benefiber).  THE DAY BEFORE YOUR PROCEDURE         DATE:  10/09/09  DAY:  Wednesday   1.  Drink clear liquids the entire day-NO SOLID FOOD  2.  Do not drink anything colored red or purple.  Avoid juices with pulp.  No orange juice.  3.  Drink at least 64 oz. (8 glasses) of fluid/clear liquids during the day to prevent dehydration and help the prep work efficiently.  CLEAR LIQUIDS INCLUDE: Water Jello Ice Popsicles Tea (sugar ok, no milk/cream) Powdered fruit flavored drinks Coffee (sugar ok, no milk/cream) Gatorade Juice: apple, white grape, white cranberry  Lemonade Clear bullion, consomm, broth Carbonated beverages (any kind) Strained chicken noodle soup Hard Candy                             4.  In the morning, mix first dose of MoviPrep solution:    Empty 1 Pouch A and 1 Pouch B into the disposable container    Add lukewarm drinking water to the top line of the container. Mix to dissolve    Refrigerate (mixed solution should be used within 24 hrs)  5.  Begin drinking the prep at 5:00 p.m. The MoviPrep container is divided by 4 marks.   Every 15 minutes drink the solution down to the next mark (approximately 8 oz) until the full liter is complete.   6.  Follow completed prep with 16 oz of clear liquid of  your choice (Nothing red or purple).  Continue to drink clear liquids until bedtime.  7.  Before going to bed, mix second dose of MoviPrep solution:    Empty 1 Pouch A and 1 Pouch B into the disposable container    Add lukewarm drinking water to the top line of the container. Mix to dissolve    Refrigerate  THE DAY OF YOUR PROCEDURE      DATE:  10/10/09  DAY:  Thursday  Beginning at 5:30  a.m. (5 hours before procedure):         1. Every 15 minutes, drink the solution down to the next mark (approx 8 oz) until the full liter is complete.  2. Follow completed prep with 16 oz. of clear liquid of your choice.    3. You may drink clear liquids until  8:30am  (2 HOURS BEFORE PROCEDURE).   MEDICATION INSTRUCTIONS  Unless otherwise instructed, you should take regular prescription medications with a small sip of water  as early as possible the morning of your procedure.  Diabetic patients - see separate instructions.  Stop taking Plavix or Aggrenox on  _  _  (7 days before procedure).     Stop taking Coumadin on  _ _  (5 days before procedure).  Additional medication instructions:  You will be contaced by our office prior to your procedure for directions on holding your Coumadin/Warfarin.  If you do not hear from our office 1 week prior to your scheduled procedure, please call [phone] to discuss.          OTHER INSTRUCTIONS  You will need a responsible adult at least 75 years of age to accompany you and drive you home.   This person must remain in the waiting room during your procedure.  Wear loose fitting clothing that is easily removed.  Leave jewelry and other valuables at home.  However, you may wish to bring a book to read or  an iPod/MP3 player to listen to music as you wait for your procedure to start.  Remove all body piercing jewelry and leave at home.  Total time from sign-in until discharge is approximately 2-3 hours.  You should go home directly after your  procedure and rest.  You can resume normal activities the  day after your procedure.  The day of your procedure you should not:   Drive   Make legal decisions   Operate machinery   Drink alcohol   Return to work  You will receive specific instructions about eating, activities and medications before you leave.    The above instructions have been reviewed and explained to me by   Marchelle Folks.     I fully understand and can verbalize these instructions _____________________________ Date _________

## 2010-08-07 NOTE — Assessment & Plan Note (Signed)
Summary: xolair///kp   Nurse Visit   Allergies: 1)  ! Pcn 2)  ! Sulfa 3)  ! Vancomycin 4)  ! Tetracycline 5)  ! * Iv Contrast  Medication Administration  Injection # 1:    Medication: Xolair (omalizumab) 150mg     Diagnosis: EXTRINSIC ASTHMA, UNSPECIFIED (ICD-493.00)    Route: IM    Site: R deltoid    Exp Date: 04/05/2013    Lot #: [id number]    Mfr: Genetech    Comments: xolair 375 mg, 90 units, 1.0 ml x 2 in Right deltoid, and 1.0 ml x 1 in Left deltoid. Pt didn't wait.    Given by: Tammy Scott, allergy tech  Orders Added: 1)  Xolair (omalizumab) 150mg  [J2357] 2)  Administration xolair injection [[id number]] s

## 2010-08-07 NOTE — Progress Notes (Signed)
Summary: speak to rhonda  Phone Note Call from Patient Call back at Home Phone [phone]   Caller: Patient Call For: rhonda Summary of Call: pt req to speak to rhonda (wouldn't say any more) Initial call taken by: Tivis Ringer, CNA,  September 23, 2009 4:37 PM  Follow-up for Phone Call        Pt had questions about [facility]. Answered pt's questions. Rhonda Cobb  September 24, 2009 8:39 AM

## 2010-08-07 NOTE — Assessment & Plan Note (Signed)
Summary: 2 wks xolair ///kp   Nurse Visit   Allergies: 1)  ! Pcn 2)  ! Sulfa 3)  ! Vancomycin 4)  ! Tetracycline 5)  ! * Iv Contrast  Medication Administration  Injection # 1:    Medication: Xolair (omalizumab) 150mg     Diagnosis: EXTRINSIC ASTHMA, UNSPECIFIED (ICD-493.00)    Route: SQ    Site: R deltoid    Exp Date: 11/03/2012    Lot #: [id number]    Mfr: GENENTECH    Comments: 1.0 ML X 2 IN RIGHT ARM AND 1.0ML IN LEFT ARM CHARGED [id number] AND [id number]    Patient tolerated injection without complications    Given by: TAMMY SCOTT IN ALLERGY LB   Medication Administration  Injection # 1:    Medication: Xolair (omalizumab) 150mg     Diagnosis: EXTRINSIC ASTHMA, UNSPECIFIED (ICD-493.00)    Route: SQ    Site: R deltoid    Exp Date: 11/03/2012    Lot #: [id number]    Mfr: GENENTECH    Comments: 1.0 ML X 2 IN RIGHT ARM AND 1.0ML IN LEFT ARM CHARGED [id number] AND [id number]    Patient tolerated injection without complications    Given by: TAMMY SCOTT IN ALLERGY [facility]

## 2010-08-12 ENCOUNTER — Telehealth (INDEPENDENT_AMBULATORY_CARE_PROVIDER_SITE_OTHER): Payer: Self-pay | Admitting: *Deleted

## 2010-08-13 NOTE — Miscellaneous (Signed)
Summary: Orders Update   Clinical Lists Changes  Orders: Added new Service order of Xolair (omalizumab) 150mg  ([id number]) - Signed Added new Service order of Administration xolair injection [phone]) - Signed      Medication Administration  Injection # 1:    Medication: Xolair (omalizumab) 150mg     Diagnosis: EXTRINSIC ASTHMA, UNSPECIFIED (ICD-493.00)    Route: SQ    Site: R deltoid    Exp Date: 07/2013    Lot #: [id number]    Mfr: Genetech    Comments: 1.0 ML X 2 IN RIGHT AND 1.0 ML IN LEFT 375 MG CHARGED J2357 AND  36644    Given by: Drucie Opitz IN ALLERGY LAB  Orders Added: 1)  Xolair (omalizumab) 150mg  [J2357] 2)  Administration xolair injection [03474]

## 2010-08-21 ENCOUNTER — Encounter: Payer: Self-pay | Admitting: Pulmonary Disease

## 2010-08-21 ENCOUNTER — Ambulatory Visit (INDEPENDENT_AMBULATORY_CARE_PROVIDER_SITE_OTHER): Payer: MEDICARE

## 2010-08-21 DIAGNOSIS — J45909 Unspecified asthma, uncomplicated: Secondary | ICD-10-CM

## 2010-08-21 NOTE — Progress Notes (Signed)
Summary: advair refill---done  Phone Note Call from Patient Call back at Home Phone [phone]   Caller: Patient Call For: clance Summary of Call: pt wants nurse to check on her xolair shipment.  Initial call taken by: Tivis Ringer, CNA,  August 12, 2010 2:48 PM  Follow-up for Phone Call        Will forward to Tammy in the allergy lab Vernie Murders  August 12, 2010 5:01 PM   Additional Follow-up for Phone Call Additional follow up Details #1::        Pt. was calling about her advair rx runs out today. She called it in Sunday afternoon. [facility] [phone]. Additional Follow-up by: Tammy Scott,  August 12, 2010 5:26 PM    Additional Follow-up for Phone Call Additional follow up Details #2::    Pt aware RX for Advair sent to her pharmacy. Follow-up by: Lori Cooper CMA,  August 12, 2010 5:31 PM  Prescriptions: ADVAIR DISKUS 500-50 MCG/DOSE  MISC (FLUTICASONE-SALMETEROL) 1 puff two times a day Brand medically necessary #60 Each x 4   Entered by:   Lori Cooper CMA   Authorized by:   Keith M Clance MD   Signed by:   Lori Cooper CMA on 08/12/2010   Method used:   Electronically to        [facility]  [id number] (retail)       [address]       [place], [place]  [address]       Ph: [phone] or [phone]       Fax: [phone]   RxID:   [id number]

## 2010-08-27 ENCOUNTER — Other Ambulatory Visit (HOSPITAL_COMMUNITY): Payer: Self-pay | Admitting: Obstetrics & Gynecology

## 2010-08-27 DIAGNOSIS — Z1231 Encounter for screening mammogram for malignant neoplasm of breast: Secondary | ICD-10-CM

## 2010-08-27 NOTE — Assessment & Plan Note (Signed)
Summary: 2 wks xolair ///kp   Nurse Visit   Allergies: 1)  ! Pcn 2)  ! Sulfa 3)  ! Vancomycin 4)  ! Tetracycline 5)  ! * Iv Contrast  Medication Administration  Injection # 1:    Medication: Xolair (omalizumab) 150mg     Diagnosis: EXTRINSIC ASTHMA, UNSPECIFIED (ICD-493.00)    Route: SQ    Site: L deltoid    Exp Date: 07/2013    Lot #: [id number]    Mfr: Salome Spotted    Comments: 1.0ML X 2 IN LEFT ARM AND 1.0 ML IN RIGHT 375 MG CHARGED J2357 AND 81191    Given by: Babette Relic SCOTT IN ALLERGY LAB  Orders Added: 1)  Xolair (omalizumab) 150mg  [J2357] 2)  Administration xolair injection [47829]   Medication Administration  Injection # 1:    Medication: Xolair (omalizumab) 150mg     Diagnosis: EXTRINSIC ASTHMA, UNSPECIFIED (ICD-493.00)    Route: SQ    Site: L deltoid    Exp Date: 07/2013    Lot #: [id number]    Mfr: Salome Spotted    Comments: 1.0ML X 2 IN LEFT ARM AND 1.0 ML IN RIGHT 375 MG CHARGED J2357 AND 86578    Given by: TAMMY SCOTT IN ALLERGY LAB  Orders Added: 1)  Xolair (omalizumab) 150mg  [J2357] 2)  Administration xolair injection [46962]

## 2010-09-04 ENCOUNTER — Ambulatory Visit (INDEPENDENT_AMBULATORY_CARE_PROVIDER_SITE_OTHER): Payer: MEDICARE

## 2010-09-04 ENCOUNTER — Encounter: Payer: Self-pay | Admitting: Pulmonary Disease

## 2010-09-04 DIAGNOSIS — J45909 Unspecified asthma, uncomplicated: Secondary | ICD-10-CM

## 2010-09-11 NOTE — Assessment & Plan Note (Signed)
Summary: XOLAIR//SH   Nurse Visit   Allergies: 1)  ! Pcn 2)  ! Sulfa 3)  ! Vancomycin 4)  ! Tetracycline 5)  ! * Iv Contrast  Medication Administration  Injection # 1:    Medication: Xolair (omalizumab) 150mg     Diagnosis: EXTRINSIC ASTHMA, UNSPECIFIED (ICD-493.00)    Route: SQ    Site: L deltoid    Exp Date: 09/2013    Lot #: [id number]    Mfr: Genetech    Comments: 1.0 ML X 2 IN LEFT AND 1.0 ML IN RIGHT ARM 375 MG CHARGED J2357 AND 41324    Given by: Drucie Opitz IN ALLERGY LAB  Orders Added: 1)  Xolair (omalizumab) 150mg  [J2357] 2)  Administration xolair injection [40102]   Medication Administration  Injection # 1:    Medication: Xolair (omalizumab) 150mg     Diagnosis: EXTRINSIC ASTHMA, UNSPECIFIED (ICD-493.00)    Route: SQ    Site: L deltoid    Exp Date: 09/2013    Lot #: [id number]    Mfr: Genetech    Comments: 1.0 ML X 2 IN LEFT AND 1.0 ML IN RIGHT ARM 375 MG CHARGED J2357 AND 44034    Given by: Lynnae Sandhoff FORD IN ALLERGY LAB  Orders Added: 1)  Xolair (omalizumab) 150mg  [J2357] 2)  Administration xolair injection [74259]

## 2010-09-15 ENCOUNTER — Ambulatory Visit (HOSPITAL_COMMUNITY)
Admission: RE | Admit: 2010-09-15 | Discharge: 2010-09-15 | Disposition: A | Discharge status: Home / Self Care | Payer: MEDICARE | Source: Ambulatory Visit | Attending: Obstetrics & Gynecology | Admitting: Obstetrics & Gynecology

## 2010-09-15 DIAGNOSIS — Z1231 Encounter for screening mammogram for malignant neoplasm of breast: Secondary | ICD-10-CM

## 2010-09-18 ENCOUNTER — Ambulatory Visit: Payer: MEDICARE

## 2010-09-18 ENCOUNTER — Other Ambulatory Visit: Payer: Self-pay | Admitting: Obstetrics & Gynecology

## 2010-09-18 DIAGNOSIS — R928 Other abnormal and inconclusive findings on diagnostic imaging of breast: Secondary | ICD-10-CM

## 2010-09-19 ENCOUNTER — Ambulatory Visit: Payer: MEDICARE

## 2010-09-22 ENCOUNTER — Encounter: Payer: Self-pay | Admitting: Pulmonary Disease

## 2010-09-22 ENCOUNTER — Telehealth (INDEPENDENT_AMBULATORY_CARE_PROVIDER_SITE_OTHER): Payer: Self-pay | Admitting: *Deleted

## 2010-09-22 ENCOUNTER — Ambulatory Visit (INDEPENDENT_AMBULATORY_CARE_PROVIDER_SITE_OTHER): Payer: MEDICARE

## 2010-09-22 DIAGNOSIS — J45909 Unspecified asthma, uncomplicated: Secondary | ICD-10-CM

## 2010-09-23 ENCOUNTER — Telehealth: Payer: Self-pay | Admitting: Pulmonary Disease

## 2010-09-24 ENCOUNTER — Ambulatory Visit
Admission: RE | Admit: 2010-09-24 | Discharge: 2010-09-24 | Disposition: A | Discharge status: Home / Self Care | Payer: MEDICARE | Source: Ambulatory Visit | Attending: Obstetrics & Gynecology | Admitting: Obstetrics & Gynecology

## 2010-09-24 DIAGNOSIS — R928 Other abnormal and inconclusive findings on diagnostic imaging of breast: Secondary | ICD-10-CM

## 2010-09-24 NOTE — Telephone Encounter (Signed)
Spoke with patient and she recvd. Statement. I advised pt that I would have [name] Return her call and help her with this issue. [facility] patient. [name]

## 2010-10-02 NOTE — Assessment & Plan Note (Signed)
Summary: XOLIAR/CB   Nurse Visit   Allergies: 1)  ! Pcn 2)  ! Sulfa 3)  ! Vancomycin 4)  ! Tetracycline 5)  ! * Iv Contrast  Medication Administration  Injection # 1:    Medication: Xolair (omalizumab) 150mg     Diagnosis: EXTRINSIC ASTHMA, UNSPECIFIED (ICD-493.00)    Route: SQ    Site: R deltoid    Exp Date: 09/2013    Lot #: [id number]    Mfr: Salome Spotted    Comments: 1.0 ml x 2 in right arm and  1.0 ml in left arm 375 MG CHARGED J2357 AND 21308    Given by: Babette Relic SCOTT IN ALLERGY LAB  Orders Added: 1)  Xolair (omalizumab) 150mg  [J2357] 2)  Administration xolair injection [65784]   Medication Administration  Injection # 1:    Medication: Xolair (omalizumab) 150mg     Diagnosis: EXTRINSIC ASTHMA, UNSPECIFIED (ICD-493.00)    Route: SQ    Site: R deltoid    Exp Date: 09/2013    Lot #: [id number]    Mfr: Salome Spotted    Comments: 1.0 ml x 2 in right arm and  1.0 ml in left arm 375 MG CHARGED J2357 AND 28413    Given by: TAMMY SCOTT IN ALLERGY LAB  Orders Added: 1)  Xolair (omalizumab) 150mg  [J2357] 2)  Administration xolair injection [24401]

## 2010-10-02 NOTE — Progress Notes (Signed)
Summary: Pulse 102 is morning. Pt. wants get off pred..  Phone Note Call from Patient   Caller: Patient Call For: Dr.Clance Details for Reason: wants to get off pred.. Summary of Call: Mrs.Spychalski went to an urgent care Dr. this weekend (she can't remember her name),she gave her pred.for fluid in her rt.ear and head congestion.(She gave her another med. but she can't remember the name of it.) She wants you to take her off of the pred.Her heart feels like it's going to fly away.(pulse 102) Please advise. Pt.is coming in for xolair shot this morning. Initial call taken by: Dimas Millin,  September 22, 2010 11:15 AM  Follow-up for Phone Call        ok to stop prednisone, but did he put her on an abx for her ear issue.  If so, just finish that up Follow-up by: Barbaraann Share MD,  September 22, 2010 4:37 PM  Additional Follow-up for Phone Call Additional follow up Details #1::        Called, spoke with pt.  States they did put her on a zpak.  She was informed per [facility] ok to stop prednisone but finish up abx.  She verbalized understanding of this  Additional Follow-up by: Gweneth Dimitri RN,  September 22, 2010 4:49 PM

## 2010-10-06 ENCOUNTER — Ambulatory Visit (INDEPENDENT_AMBULATORY_CARE_PROVIDER_SITE_OTHER): Payer: MEDICARE

## 2010-10-06 DIAGNOSIS — J45909 Unspecified asthma, uncomplicated: Secondary | ICD-10-CM

## 2010-10-06 MED ORDER — OMALIZUMAB 150 MG ~~LOC~~ SOLR
375.0000 mg | Freq: Once | SUBCUTANEOUS | Status: AC
  Administered 2010-10-06: 375 mg via SUBCUTANEOUS

## 2010-10-11 LAB — CBC
HCT: 41.2 % (ref 36.0–46.0)
HCT: 44 % (ref 36.0–46.0)
MCHC: 33.8 g/dL (ref 30.0–36.0)
MCHC: 33.8 g/dL (ref 30.0–36.0)
MCV: 90.1 fL (ref 78.0–100.0)
MCV: 89.9 fL (ref 78.0–100.0)
Platelets: 201 10*3/uL (ref 150–400)
RBC: 4.89 MIL/uL (ref 3.87–5.11)
RDW: 14 % (ref 11.5–15.5)
WBC: 13.3 10*3/uL — ABNORMAL HIGH (ref 4.0–10.5)

## 2010-10-11 LAB — DIFFERENTIAL
Basophils Absolute: 0.1 10*3/uL (ref 0.0–0.1)
Basophils Relative: 1 % (ref 0–1)
Basophils Relative: 1 % (ref 0–1)
Eosinophils Absolute: 0.4 10*3/uL (ref 0.0–0.7)
Eosinophils Absolute: 1 10*3/uL — ABNORMAL HIGH (ref 0.0–0.7)
Eosinophils Relative: 5 % (ref 0–5)
Eosinophils Relative: 7 % — ABNORMAL HIGH (ref 0–5)
Lymphs Abs: 4.8 10*3/uL — ABNORMAL HIGH (ref 0.7–4.0)
Monocytes Absolute: 0.5 10*3/uL (ref 0.1–1.0)
Monocytes Relative: 6 % (ref 3–12)
Neutrophils Relative %: 50 % (ref 43–77)

## 2010-10-11 LAB — BASIC METABOLIC PANEL
BUN: 10 mg/dL (ref 6–23)
BUN: 8 mg/dL (ref 6–23)
CO2: 27 mEq/L (ref 19–32)
CO2: 28 mEq/L (ref 19–32)
Chloride: 104 mEq/L (ref 96–112)
Chloride: 102 mEq/L (ref 96–112)
Creatinine, Ser: 0.86 mg/dL (ref 0.4–1.2)
GFR calc non Af Amer: >60 mL/min (ref >60)
Glucose, Bld: 114 mg/dL — ABNORMAL HIGH (ref 70–99)
Potassium: 4 mEq/L (ref 3.5–5.1)
Potassium: 4.1 mEq/L (ref 3.5–5.1)

## 2010-10-11 LAB — URINALYSIS, ROUTINE W REFLEX MICROSCOPIC
Ketones, ur: NEGATIVE mg/dL
Nitrite: NEGATIVE
Protein, ur: NEGATIVE mg/dL
Urobilinogen, UA: 0.2 mg/dL (ref 0.0–1.0)

## 2010-10-11 LAB — POCT CARDIAC MARKERS
CKMB, poc: <1 ng/mL — ABNORMAL LOW (ref 1.0–8.0)
CKMB, poc: <1 ng/mL — ABNORMAL LOW (ref 1.0–8.0)
Myoglobin, poc: 91.5 ng/mL (ref 12–200)
Myoglobin, poc: 100 ng/mL (ref 12–200)
Troponin i, poc: <0.05 ng/mL (ref 0.00–0.09)
Troponin i, poc: <0.05 ng/mL (ref 0.00–0.09)

## 2010-10-11 LAB — HEPATIC FUNCTION PANEL
Bilirubin, Direct: <0.1 mg/dL (ref 0.0–0.3)
Total Protein: 6.5 g/dL (ref 6.0–8.3)

## 2010-10-11 LAB — PROTIME-INR: Prothrombin Time: 26 seconds — ABNORMAL HIGH (ref 11.6–15.2)

## 2010-10-11 LAB — URINE CULTURE: Culture: NO GROWTH

## 2010-10-20 ENCOUNTER — Ambulatory Visit (INDEPENDENT_AMBULATORY_CARE_PROVIDER_SITE_OTHER): Payer: MEDICARE

## 2010-10-20 DIAGNOSIS — J45909 Unspecified asthma, uncomplicated: Secondary | ICD-10-CM

## 2010-10-20 MED ORDER — OMALIZUMAB 150 MG ~~LOC~~ SOLR
375.0000 mg | Freq: Once | SUBCUTANEOUS | Status: AC
  Administered 2010-10-20: 375 mg via SUBCUTANEOUS

## 2010-11-03 ENCOUNTER — Ambulatory Visit (INDEPENDENT_AMBULATORY_CARE_PROVIDER_SITE_OTHER): Payer: MEDICARE

## 2010-11-03 DIAGNOSIS — J45909 Unspecified asthma, uncomplicated: Secondary | ICD-10-CM

## 2010-11-03 MED ORDER — OMALIZUMAB 150 MG ~~LOC~~ SOLR
375.0000 mg | Freq: Once | SUBCUTANEOUS | Status: AC
  Administered 2010-11-03: 375 mg via SUBCUTANEOUS

## 2010-11-05 ENCOUNTER — Encounter: Payer: Self-pay | Admitting: Pulmonary Disease

## 2010-11-07 ENCOUNTER — Ambulatory Visit (INDEPENDENT_AMBULATORY_CARE_PROVIDER_SITE_OTHER): Payer: MEDICARE | Admitting: Pulmonary Disease

## 2010-11-07 ENCOUNTER — Encounter: Payer: Self-pay | Admitting: Pulmonary Disease

## 2010-11-07 DIAGNOSIS — J301 Allergic rhinitis due to pollen: Secondary | ICD-10-CM

## 2010-11-07 DIAGNOSIS — J45909 Unspecified asthma, uncomplicated: Secondary | ICD-10-CM

## 2010-11-07 NOTE — Assessment & Plan Note (Signed)
The pt is having a lot of allergy symptoms this year despite xolair and an aggressive nasal hygiene regimen.  Will try her on singulair to see if it helps this.

## 2010-11-07 NOTE — Progress Notes (Signed)
  Subjective:    Patient ID: [name], female    DOB: Jun 26, 1924, 77 y.o.   MRN: [id number]  HPI The pt comes in today for f/u of her known severe asthma.  She has been compliant with inhaler regimen and xolair.  She has been having a difficult time with her allergies this year, and has ongoing nasal congestion and discharge.  Was seen by ENT who recommended continuing saline rinses.  She feels her chronic doe is at baseline, and denies any recent chest infection.    Review of Systems  Constitutional: Negative for fever and unexpected weight change.  HENT: Positive for nosebleeds, congestion and sinus pressure. Negative for ear pain, sore throat, rhinorrhea, sneezing, trouble swallowing, dental problem and postnasal drip.   Eyes: Positive for redness and itching.  Respiratory: Positive for cough, shortness of breath and wheezing. Negative for chest tightness.   Cardiovascular: Positive for leg swelling. Negative for palpitations.  Gastrointestinal: Positive for nausea. Negative for vomiting.  Genitourinary: Negative for dysuria.  Musculoskeletal: Negative for joint swelling.  Skin: Negative for rash.  Neurological: Negative for headaches.  Hematological: Does not bruise/bleed easily.  Psychiatric/Behavioral: Positive for dysphoric mood. The patient is not nervous/anxious.        Objective:   Physical Exam Wd female in nad Nares without purulence or obvious discharge Chest with one isolated wheeze on left, adequate airflow but decreased Cor with rrr LE with 2+ edema bilat, no cyanosis  Alert and oriented, moves all 4        Assessment & Plan:

## 2010-11-07 NOTE — Patient Instructions (Signed)
Continue current asthma meds and xolair.  Will give you a trial of singulair 10mg  one a day for your ongoing allergies Try and stay as active as possible followup with me in 6mos

## 2010-11-10 ENCOUNTER — Encounter: Payer: Self-pay | Admitting: Pulmonary Disease

## 2010-11-10 NOTE — Assessment & Plan Note (Signed)
The pt has a h/o severe asthma, but is doing about as well as can be expected.  She is on an aggressive regimen, and may benefit from adding singulair given her worsening allergies this season.  At least we have significantly limited her acute exacerbations, and overall she feels she is reasonably controlled.

## 2010-11-17 ENCOUNTER — Telehealth: Payer: Self-pay | Admitting: Pulmonary Disease

## 2010-11-17 ENCOUNTER — Ambulatory Visit (INDEPENDENT_AMBULATORY_CARE_PROVIDER_SITE_OTHER): Payer: MEDICARE

## 2010-11-17 DIAGNOSIS — J45909 Unspecified asthma, uncomplicated: Secondary | ICD-10-CM

## 2010-11-17 MED ORDER — OMALIZUMAB 150 MG ~~LOC~~ SOLR
375.0000 mg | Freq: Once | SUBCUTANEOUS | Status: AC
  Administered 2010-11-17: 375 mg via SUBCUTANEOUS

## 2010-11-18 ENCOUNTER — Telehealth: Payer: Self-pay | Admitting: Pulmonary Disease

## 2010-11-18 NOTE — Consult Note (Signed)
NAMEADANYA, SOSINSKI        ACCOUNT NO.:  [id number]   MEDICAL RECORD NO.:  [id number]          PATIENT TYPE:  EMS   LOCATION:  [facility]                         FACILITY:  [facility]   PHYSICIAN:  Vesta Mixer, M.D. DATE OF BIRTH:  [birth year]   DATE OF CONSULTATION:  DATE OF DISCHARGE:  03/05/2009                                 CONSULTATION   [name] is an 77 year old female with a history of atrial  fibrillation, coronary artery disease, and asthma.  We are asked to see  her today for an abnormal EKG.   [name] has a history of coronary artery disease.  She is  status post PTCA and stenting of her left anterior descending artery  several years ago.  She also has a history of asthma.  She has a history  of paroxysmal atrial fibrillation and was converted on an IV diltiazem  drip several weeks ago.  Her digoxin was increased during that  admission.  She has overall done fairly well.  Since that admission, she  has had gradual nausea and generalized weakness and has not felt well.  She went to Dr. Alda Berthold office today and was sent to the emergency room  for further evaluation.  She was to be seen by [facility].   The patient has not felt well since her digoxin dose was increased.   She denies any episodes of angina.  She denies any shortness of breath.  She denies any increase in her wheezing.  She denies any PND or  orthopnea.  She denies any syncope or presyncope.  She is not eating  very well.  She has some mild constipation, but this is fairly normal  for her.  She denies any rash or skin nodules.   She was seen in the emergency room by the ER doctors, and we were called  to consult on her.   CURRENT MEDICATIONS:  1. Warfarin as directed by Dr. Mayford Knife.  2. Advair Diskus inhaler.  3. Diovan/hydrochlorothiazide.  4. Digoxin 0.25 mg a day.  5. Lasix once a day.   She is allergic to multiple medications including SULFA MEDICATIONS,  PENICILLIN,  LEVAQUIN, CIPRO.  She was unable to recall the rest.   PAST MEDICAL HISTORY:  1. Coronary artery disease.  She is status post PTCA and stenting of      her left anterior descending artery.  2. History of severe asthma.  3. Hypertension.  4. Glaucoma.  5. Paroxysmal atrial fibrillation.   PAST SURGICAL HISTORY:  She is status post:  1. Cholecystectomy.  2. Hysterectomy.  3. Lumbar spine surgery.  4. Bladder tack surgery.  5. Sinus surgery.   SOCIAL HISTORY:  The patient is divorced.  She has 3 children.  She  lives with her granddaughter.  She used to smoke, but quit in 1968.  She  denies any alcohol.   FAMILY HISTORY:  Negative.   REVIEW OF SYSTEMS:  As reviewed in the HPI.  All other systems were  reviewed and are negative.   PHYSICAL EXAMINATION:  GENERAL:  She is an elderly female in no acute  distress.  She is completely  pain free.  VITAL SIGNS:  Blood pressure is 140/66, heart rate is 67 and is  irregular.  NECK:  2+ carotids.  She has no bruits, no JVD, no thyromegaly.  HEENT:  Her sclerae are nonicteric.  Her mucous membranes are moist.  LUNGS:  Bilateral wheezing and she is fairly tight.  She states that  this is not at all uncommon for her.  HEART:  Regular rate, S1 and S2.  She has a very soft systolic murmur.  PMI is nondisplaced.  ABDOMEN:  Good bowel sounds.  It is mildly tender.  There is good bowel sounds.  There is no hepatosplenomegaly.  EXTREMITIES:  She has no clubbing, cyanosis, or edema.  NEUROLOGIC:  There are no rash or skin nodules.   Her EKG reveals normal sinus rhythm.  There is some artifact, but  clearly there is P-waves.   LABORATORY DATA:  Her digoxin level is 1.9.  Her white blood cell count  is 8.5, her hemoglobin is 13.9, her hematocrit is 41.2.  Her INR is 2.6.  Her sodium is 139, potassium is 4.0, chlorides 104, CO2 is 27, glucose  is 114, BUN is 10, and creatinine 0.8.   1. [name] presents with an abnormal EKG, but feels  entirely      well.  She does have generalized failure to thrive and gradual      nausea.  I suspect that this is due to her digoxin.  In fact, she      comments that her symptoms started when the digoxin dose was      increased.  We will have her hold her digoxin tomorrow and then cut      her dose in half.  She will follow up with Dr. Mayford Knife in the next      several days.  2. Coronary artery disease.  The patient has a history of coronary      artery disease.  Her EKG does have some mild ST-segment changes but      I suspect that these are due to digoxin.  Her cardiac enzymes are      negative here, and she has not had any episodes of angina.  She      will follow up for this with Dr. Mayford Knife.  All of her other medical      problems remain stable.      Vesta Mixer, M.D.  Electronically Signed     PJN/MEDQ  D:  03/05/2009  T:  03/06/2009  Job:  [id number]   cc:   Tammy R. Collins [place], M.D.  Armanda Magic, M.D.

## 2010-11-18 NOTE — Telephone Encounter (Signed)
Called and spoke with pt and she stated that she had received her [facility] card. Rhonda Cobb

## 2010-11-18 NOTE — Telephone Encounter (Signed)
Called and spoke with patient and she received her [facility] card. Effective 10/21/10 to 07/06/11.[name]

## 2010-11-18 NOTE — H&P (Signed)
[name], [name]        ACCOUNT NO.:  [id number]   MEDICAL RECORD NO.:  [id number]          PATIENT TYPE:  INP   LOCATION:  3705                         FACILITY:  [facility]   PHYSICIAN:  Bevelyn Buckles. Bensimhon, MDDATE OF BIRTH:  [birth year]25   DATE OF ADMISSION:  02/08/2009  DATE OF DISCHARGE:                              HISTORY & PHYSICAL   PRIMARY CARDIOLOGIST:  Armanda Magic, MD   REASON FOR ADMISSION:  Recurrent atrial fibrillation with rapid  ventricular response.   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   She had her initial episode of atrial fibrillation back in 1978.  There  was no recurrence until 2008.  She was treated with digoxin.  She was  unable to take either diltiazem or verapamil due to a rash.  She saw Dr.  Graciela Husbands in 2008, who discussed the alternatives including a continued  therapy with digoxin or starting Tikosyn versus AV node ablation and  pacemaker.  Given that she just had 1 recurrence, she was managed  conservatively.  She did have recurrent atrial fibrillation in March  2009, which was apparently brief.   She was doing well from a cardiac point until last night around 11 p.m.,  she developed recurrent palpitations.  She came to the ER and was found  to be in atrial fibrillation with a ventricular rate of 119.  She was  then treated with an IV Cardizem bolus and then a drip and now she is  back in sinus rhythm.  She denies any chest pain, syncope, dyspnea, or  heart failure.  Her cardiac markers were normal.  Her INR was 2.4.   REVIEW OF SYSTEMS:  She does have chronic left shoulder bursitis and  some other arthritis pain, but the remainder of her review of systems is  normal.  All systems are normal except as mentioned in the HPI and  problem  list.   PROBLEM LIST:  1. Paroxysmal atrial fibrillation.      a.     Intolerant of beta blockers as well as calcium channel       blockers.  2. Coronary artery disease status post left anterior descending artery      stent.  3. Severe asthma.  4. Hypertension.  5. Glaucoma.  6. Gallstones status post cholecystectomy.   PAST SURGICAL HISTORY:  1. Cholecystectomy.  2. Hysterectomy.  3. Lumbar spine surgery.  4. Bladder tack surgery.  5. Sinus surgery.   Medicines at home are Exforge, Coumadin, Advair, and digoxin.   Medication allergies are PENICILLIN, SULFA, TEQUIN, VANCOMYCIN,  VERAPAMIL, and CARDIZEM.   SOCIAL HISTORY:  She is divorced.  She has 3 children.  She lives with  her great granddaughter.  She does not use cigarettes, alcohol, or  drugs.   Family history is noncontributory.   PHYSICAL EXAMINATION:  GENERAL:  She is an elderly woman in no acute  distress, lying flat in bed with no respiratory difficulty.  VITAL SIGNS:  Blood pressure is 136/61, heart rate is 55.  She is  afebrile.  Saturations are 96% on room air.  HEENT:  Normal.  NECK:  Supple.  No JVD.  Carotids are 2+ bilaterally without any bruits.  There is no lymphadenopathy or thyromegaly.  CARDIAC:  PMI is nondisplaced.  She is regular with no murmurs, rubs, or  gallops.  LUNGS:  Decreased air movement throughout with mild end-expiratory  wheezing.  ABDOMEN:  Soft, nontender, and nondistended.  No hepatosplenomegaly,  bruits, or masses appreciated.  EXTREMITIES:  Warm with no cyanosis or clubbing.  She does have 2+ ankle  edema on the right and 1+ on the left.  This is chronic.  There is no  rash.  NEUROLOGIC:  Alert and oriented x3.  Cranial nerves II through XII  grossly intact.  Moves all 4 extremities without difficulty.  Affect is  pleasant.   EKG shows atrial fibrillation with RSR prime.  Rate was 119.  No  significant ST-T-wave abnormalities.  INR was 2.4.  CBC showed white  count of  13.3, hemoglobin of 14.8, and platelets were clumped, but  appeared adequate.  Digoxin level was 0.3.  Urinalysis was negative.  Sodium was 137, potassium 4.1, BUN of 8, creatinine of 0.86, glucose of  111.  Cardiac markers were normal with a troponin of less than 0.05 and  a CK-MB of less than 1.0.   ASSESSMENT:  1. Paroxysmal atrial fibrillation, resolved.  2. Asthma, stable.  3. Coronary artery disease status post left anterior descending artery      stent.  4. Leukocytosis without any localizing symptoms and negative      urinalysis and chest x-ray.   PLAN/DISCUSSION:  She is back in sinus rhythm.  She is likely suitable  for discharge this morning.  If atrial fibrillation becomes more  frequent, consider Tikosyn.  Plan otherwise per Dr. Mayford Knife.      Bevelyn Buckles. Bensimhon, MD  Electronically Signed     DRB/MEDQ  D:  02/08/2009  T:  02/08/2009  Job:  [id number]

## 2010-11-18 NOTE — Op Note (Signed)
NAME:  [name], [name]        ACCOUNT NO.:  [id number]   MEDICAL RECORD NO.:  [id number]          PATIENT TYPE:  AMB   LOCATION:  DSC                          FACILITY:  [facility]   PHYSICIAN:  Rodney A. Mortenson, M.D.DATE OF BIRTH:  03-15-1924   DATE OF PROCEDURE:  03/06/2008  DATE OF DISCHARGE:                               OPERATIVE REPORT   JUSTIFICATION:  An 77 year old female with 4-5-day history of sudden  onset of right knee pain.  This is felt over the anterior aspect of the  knee and extends down the proximal medial tibia.  No real problem with  the knee.  Examination shows definite tenderness along medial joint  line, some tenderness along the lateral joint line.  Force in the  patella and to intercondylar notch is vaguely uncomfortable.  Weightbearing film shows 80% collapse of the medial compartment.  No  loose bodies.  An MRI was done and this shows accelerated  tricompartmental degenerative changes, also a complex tear of the  posterior horn of medial meniscus.  The patient now admitted for  arthroscopic evaluation and treatment.  Questions answered and  encouraged preoperatively.  Complication discussed extensively.   JUSTIFICATION OUTPATIENT SURGERY:  Minimum morbidity.   PREOPERATIVE DIAGNOSIS:  Tricompartmental degenerative changes of right  knee; complex medial meniscal tear.   POSTOPERATIVE DIAGNOSIS:  Tricompartmental degenerative changes of right  knee; complex medial meniscal tear.   OPERATION:  Arthroscopy; chondroplasty of patella and femoral notch;  debride posterior horn of medial meniscus, right knee.   SURGEON:  Lenard Galloway. Chaney Malling, MD   ANESTHESIA:  MAC.   PATHOLOGY:  With the arthroscope of knee, a very careful examination of  knee was undertaken.  The patient had grade 2 changes in posterior  aspect of the patella, but severe changes in the intercondylar notch  area with marked fraying and tearing of cartilage with at least grade 3  changes.  Over the lateral femoral condyle, there was an area of raw  bone.  The anterior cruciate was normal.  In the lateral compartment,  there was some fraying of articular cartilage of lateral femoral  condyle, but the lateral tibial plateau appeared normal as did the  entire lateral meniscus.  With the arthroscope in the medial  compartment, there was an area in the weightbearing area of the medial  tibial plateau that had absolutely no articular cartilage and there was  extensive tearing of posterior horn of the medial meniscus which  appeared to be acute.   PROCEDURE:  The patient was placed on the operating table in supine  position with a pneumatic tourniquet above the right upper thigh.  Right  leg was placed in the leg holder.  Entire right lower extremity was  prepped with DuraPrep and draped out in the usual manner.  An infusion  cannula was placed in superior medial pouch and the knee distended with  saline.  Anteromedial and anterolateral portals were made, and the  arthroscope was introduced.  The findings are described above.  Attention first turned to the posterior aspect of patella which was  aggressively debrided with a chondroplasty shaver.  Then, the femoral  notch area was treated in a similar manner.  All loose and torn  cartilage were debrided and a good stable base was achieved.  Next area  of pathology was in the medial compartment.  There was extensive tearing  of posterior horn of the medial meniscus and multiple horizontal tears  and through both in medial and lateral portals of this was debrided very  aggressively.  Once this was accomplished to my satisfaction, intra-  articular shaver was introduced and all debris was removed.  The  remaining rim was then smoothed and balanced to a nice transition of the  mid third of the medial meniscus.  The knee was then filled with  Marcaine.  A large bulky pressure dressing was applied.  The patient  returned to  recovery room in excellent condition.  Technically, I was  extremely pleased with the surgical outcome.   DISPOSITION:  1. The patient to restart Coumadin today.  2. She will be on Lovenox b.i.d. until her protime are up to normal of      at least 1.5-2.0  3. Percocet for pain.  4. The patient will be seen in my absence next week by my PA and start      physical therapy.      Rodney A. Chaney Malling, M.D.  Electronically Signed     RAM/MEDQ  D:  03/06/2008  T:  03/07/2008  Job:  [id number]

## 2010-11-18 NOTE — Letter (Signed)
Nov 30, 2006     RE:  BASMA, BUCHNER  MRN:  [id number]  /  DOB:  1923/09/13   To Whom it may concern:   I am writing on the behalf of Ms. Lu Duffel, a patient that I  have been following for quite some time with very severe asthma as well  as allergic disease.  Ms. Klett has been on Xolair since early  2007 and has had quite a remarkable response to the medication.  When  she is on Xolair I see her in the office no sooner than every 3-4 months  and she does not have acute exacerbations.  On a couple of occasions her  grant money for the Xolair has run out and she has gone for a period of  time without the drug, which has resulted in frequent exacerbations of  her asthma every 2 weeks with office visits and high dose prednisone  tapers.  It is clear from the medical record that Ms. Soden  greatly benefited from treatment with Xolair, including reduced  exacerbations and medication costs.  I would ask that we find some way  to provide her with consistent treatment with Xolair so that we can keep  her exacerbation minimized.  If I can be of further service or answers  any other questions please feel free to call.    Sincerely,      Barbaraann Share, MD,FCCP  Electronically Signed    KMC/MedQ  DD: 11/30/2006  DT: 11/30/2006  Job #: [id number]

## 2010-11-21 NOTE — Letter (Signed)
August 25, 2006    Armanda Magic, M.D.  [address]  [place], [place] [address]   RE:  [name]  MRN:  [id number]  /  DOB:  [birth year]   Dear Gloris Manchester:   It was a pleasure to see [name] today at your request  because of her atrial fibrillation.   As you know, she is an 77 year old woman with a history of atrial  fibrillation that first occurred in 1978.  I should note that at that  time in her life, she was an OR nurse at [employer].  She was  hospitalized overnight.  Her atrial fibrillation converted  spontaneously, and she was put on Digoxin by Dr. Margrett Rud, which  she was maintained on for many years.  Her next episode of atrial  fibrillation occurred 2 weeks ago.  This occurred in the context of  having her Cardizem finally stopped after years because of a rash.  Verapamil was tried as an alternative.  This was also associated with a  rash.  These drugs have been used for hypertension.  Clonidine was  initiated.  In the wake of all of this, she ends up with an episode of  atrial fibrillation with a rapid ventricular response that prompted her  to go to the emergency room.  This was identified with a rate of about  148, then the patient converted spontaneously to sinus rhythm.   As best as the patient is aware, this is her only episode of atrial  fibrillation in 30 years.   I should note that there was an issue of bradycardia assessed about 6 or  8 years ago when you were over at [facility].  Apparently, Dr. Elsie Lincoln  had thought that she needed a pacemaker, and you felt not so, and so  this has been followed along.   I should note that her thromembolic risk factors are notable for  hypertension and age.   Her related cardiac history is notable for coronary artery disease.  She  apparently has normal left ventricular function, but had LAD stenting in  1997, and there is also a comment in the chart about a PTCA and the  diagonal, though the records are not available.   Her related medical history is notable for significant COPD followed by  Dr. Shelle Iron.  This has been severe enough, and she is felt not to be a  candidate for beta blocker for rate control.   PAST MEDICAL HISTORY:  In addition to the above, is notable for:  1. Peripheral neuropathy.  2. Chronic lower extremity edema, which has largely improved off of      her calcium blocker therapy.   PAST SURGICAL HISTORY:  Notable for:  1. Cholecystectomy.  2. Hysterectomy.  3. Subsequent oophorectomy.  4. Lumbar spine surgery.  5. Bladder tack surgery.  6. Sinus surgery.   SOCIAL HISTORY:  She is divorced.  She has 3 children.  Her great  granddaughter lives with her because the great granddaughter's mother  has some drug problems.  Patient does not use cigarettes, alcohol or  recreational drugs.   REVIEW OF SYSTEMS:  Her review of systems is outlined on your note, and  is reviewed, and is broadly negative across. General, eyes:  presumed  glaucoma, ears, nose, abdomen, hematologic, musculoskeletal, neurologic  apart from was noted previously.   Her medications at present include:  1. Lasix 40 p.r.n.  2. Clonidine 0.1 b.i.d.  3. Teveten.  4.  Advair.  5. Xalatan.  6. Xanax.  7. Diovan 320.  8. Nexium.  9. Digitek 0.25.  10.Coumadin.   She is allergic to multiple medications, which she describes as  antibiotics.  In the old chart here, includes:  1. PENICILLIN.  2. SULFA.  3. TEQUIN.  4. VANCOMYCIN.  5. VERAPAMIL.  6. CARDIZEM.   EXAMINATION:  She is an elderly Caucasian female, appearing her stated  age of 32.  Her blood pressure is 116/72.  Her pulse is 84.  Her weight is 171.  HEENT:  Exam demonstrated no icterus or xanthoma.  NECK:  Veins were flat.  Carotids were brisk and full bilaterally  without bruits.  BACK:  Without kyphosis or scoliosis.  LUNGS:  Clear.  HEART:  Sounds were regular without murmurs or gallops.   ABDOMEN:  Soft with active bowel sounds, without midline pulsation or  hepatomegaly.  Femoral pulses were 2+.  Distal pulses were intact.  There is no  clubbing, cyanosis or edema.  NEUROLOGIC:  Exam was grossly normal.  SKIN:  Warm and dry.   Electrocardiogram dated today demonstrated sinus rhythm at 84.  The  intervals were 0.18/0.09/0.35.  The axis was 3.  There was __________  conduction delay, and nonspecific ST changes.   Electrocardiogram dated August 15, 2006 demonstrated atrial  fibrillation with a rapid ventricular response.  The rate was 149, and  there was significant ST segment depression, which has worsened from  what her baseline ST segment depression is.  Electrocardiogram following  resolution of the atrial fibrillation demonstrated normal ST segments.   IMPRESSION:  1. Atrial fibrillation-paroxysm with a rapid ventricular response.  2. Thromboembolic risk factors notable for age and hypertension,      currently on Coumadin.  3. Coronary artery disease.      a.     Status post prior percutaneous coronary intervention.      b.     Not known left ventricular function.      c.     Abnormal ST segments with atrial fibrillation (in the       absence of digoxin).  4. Asthma, apparently precluding the use of beta blockers.  5. Propensity towards bradycardia.   [name], [name] has atrial fibrillation with a rapid ventricular  response.  However, she has only had 1 episode in 30 years.  While I  think the options for treating it are going to be very limited,  hopefully she will not have a recurrence for another 30 years, or 30  months anyway.  I think options are limited, as you mentioned, probably  to Tikosyn and/or AV junction ablation and pacing given the inability to  use rate control.  Although, she did not seem so short breath, that a  beta blocker seems to me to be inconceivable.  The other issues was, I was impressed by her ST segment changes, and   wonder whether it is not worth considering repeat Myoview scanning to  assess this.   I have told her that I would glad to be available in the event that she  has recurrent atrial fibrillation for further consideration of the above  options.   Thanks for the consultation.    Sincerely,      Duke Salvia, MD, [facility]  Electronically Signed    SCK/MedQ  DD: 08/25/2006  DT: 08/25/2006  Job #: [id number]   CC:    Tammy R. Collins [place], M.D.

## 2010-11-21 NOTE — Op Note (Signed)
[place]  Patient:    [name], [name]               MRN: [id number] Proc. Date: 01/22/00 Adm. Date:  [id number] Disc. Date: [id number] Attending:  Carlena Sax                           Operative Report  PREOPERATIVE DIAGNOSIS:  Bilateral chronic ethmoid sinusitis and bilateral chronic maxillary sinusitis.  POSTOPERATIVE DIAGNOSIS:  Bilateral chronic ethmoid sinusitis and bilateral chronic maxillary sinusitis.  PROCEDURE PERFORMED:  Bilateral     and bilateral endoscopic maxillary antrostomies using the Esidrix system for stereotactic    assist in navigation.  SURGEON:  Dr. Carlena Sax.  ANESTHESIA:  General endotracheal.  INDICATION FOR SURGERY:  This 77 year old white female gives a history of recurrent sinusitis and persistent sinusitis for about the last six months. Her main complaints are anterior nasal drainage, nasal congestion, perioral pressure and thick posterior nasal drainage.  She was treated with a three-week course of oral antibiotics, topical nasal steroids, sprays and mucolytics.  Post treatment CT scan of the sinuses did show ethmoid and maxillary sinusitis.  Based on her history and physical examination, she has failed aggressive medical management and I have recommended proceeding with the above-noted surgical procedure.  I have discussed extensively with her the risks and benefits of surgery, including risk of general anesthesia, infection, bleeding, orbital, CNS injury and normal recovery period after this type of surgery.  I have entertained questions, answered them appropriately.  Informed consent has been obtained. The patient presents for the above-noted procedure.  OPERATIVE FINDINGS:  Mucosal thickening and fluid involving the ethmoid and maxillary sinuses bilaterally.  DETAILS OF THE PROCEDURE:  The patient was brought to the operating room, placed in supine position.  General endotracheal  anesthesia was administered via the anesthesiologist without complication.  The patient was administered 600 mg of Clindamycin IV times one and 10 mg of Decadron IV times one.  The head of the table was elevated at 30 degrees.  Posterior throat pack was placed in the posterior pharynx using a small baby lap sponge.  Bilateral sphenopalatine blocks were administered via the greater palatine foramen, each with 1.5 cc of a 1% Lidocaine solution with 1:100,000 Epinephrine.  The patients face was draped in standard fashion.  Using rigid endoscopic guidance the anterior portion of both middle turbinates and uncinate processes were injected with 1% Lidocaine solution with 1:100,000 Epinephrine.  Both middle meatuses were packed with cottonoid soaked in a 4% Cocaine solution which were left in place for approximately five to ten minutes and then they were removed.  The Insta-track headpiece was placed on the patients head and was calibrated to the straight suction aspirator per protocol.  This was used throughout the entire case to identify landmarks such as lamina papyracea and skull base, which were not violated at any time.  Next, the attention was turned to the left nasal chamber.  Using a 0 degree rigid endoscope, elevator was used to back elevate the uncinate process, and uncinectomy was performed by combination of through cutting forceps and micro debrider without difficulty.  The antral sinus ostium and maxillary sinus was identified and was identified and was enlarged with through cutting forceps and a micro debrider without difficulty.  A 30 and then a 70 degree rigid endoscope were used to identify the contents of the maxillary sinus.  There was some thickened mucosa noted  but no other lesions or masses were noted and no further biopsies were taken.  Next, using a 0 degree rigid endoscope, the micro debrider and the Insta-Track system for guidance, an anterior-posterior  ethmoidectomy was performed from anterior to posterior fashion, with care not to violate skull base or lamina papyracea.  Next, the attention was turned to the right nasal chamber.  Identical procedure was carried out on this side compared to the left side with identical results using identical instrumentation.  After this was done, Kennedy packs soaked in a Bactroban Ointment were placed in each ethmoid cavity under direct visualization.  The throat pack was removed.  Gastric tube was placed.  This was used to decompress the stomach contents.  It was then removed without incident.  Fluids given during procedure:  Approximately 1 liter of crystalloid.  ESTIMATED BLOOD LOSS:  Less than 30 cc.  DRAINS:  No drains.  PACKS:  Two Kennedy packs were placed.  SPECIMENS SENT:  Sinus contents for culture, sensitivity and pathology.  The patient tolerated the procedure well without complications, was extubated in the operating room and returned to the recovery room in stable condition. Sponge, instrument and needle counts were correct at the end of the procedure. Total duration of the procedure was approximately two hours.  The patient will be admitted for overnight recovery.  Once she has recovered well, she will be sent home on January 23, 2000.  She will be sent home on Biaxin 500 mg p.o. b.i.d. for two weeks, Vicodin number 30 with two refills, one or two tabs p.o. q. 4 hours p.r.n. pain.  She is to have light activity, no heavy lifting nose blowing for two weeks after surgery.  Both her and her friend were given both oral and written instructions.  They are to call with any problems with bleeding, fever or vomiting, pain, reaction to medications or any other questions.  She will follow up in the office for pack removal and bilateral endoscopic debridement of sinus cavities on Thursday, July 26, at 1:50 p.m. DD:  01/22/00 TD:  01/23/00 Job: [id number]

## 2010-11-21 NOTE — Op Note (Signed)
NAME:  [name], [name]                  ACCOUNT NO.:  [id number]   MEDICAL RECORD NO.:  [id number]                   PATIENT TYPE:  AMB   LOCATION:  DSC                                  FACILITY:  [facility]   PHYSICIAN:  John L. Rendall III, M.D.           DATE OF BIRTH:  25-Sep-1923   DATE OF PROCEDURE:  07/23/2003  DATE OF DISCHARGE:                                 OPERATIVE REPORT   PREOPERATIVE DIAGNOSIS:  Osteomyelitis, left second toe proximal phalanx.   POSTOPERATIVE DIAGNOSIS:  Osteomyelitis, left second toe proximal phalanx.   OPERATION PERFORMED:  Saucerization and partial proximal phalangectomy, left  second toe.   SURGEON:  John L. Rendall, M.D.   ANESTHESIA:  Metatarsal block with sedation.   FINDINGS:  Necrotic bone at the proximal phalanx, proximal end with  granulation tissue, no obvious pus.  Culture and sensitivity were taken.   DESCRIPTION OF PROCEDURE:  Under metatarsal block anesthesia, the left foot  and ankle are prepared with Betadine and draped as a sterile field. An ankle  Esmarch was used.  A dorsal midline incision was made down through the  extensor tendon and the capsule exposing the proximal phalanx and the MTP  joint.  The joint was opened.  The crumbled bone at the proximal phalanx.  There was some granulation tissue there, sort of brownish, but no obvious  frank pus was found.  Aerobic and anaerobic cultures were taken.  Once all  the obvious necrotic bone was resected, essentially performing a proximal  phalangectomy.  The shaft of the proximal phalanx appears intact and viable.  It was left be after nibbling at its edges.  The wound was then irrigated  with saline.  A rubber band drain was inserted.  The extensor tendon over it  was closed with 3-0 Vicryl.  Skin was closed with 4-0 nylon except for  around the drain site.  Sterile compression bandage was applied and the  patient returned to recovery in good condition.  We will give her  Darvocet  and Biaxin, return to the clinic on Thursday.                                               John L. Dorothyann Gibbs, M.D.    Renato Gails  D:  07/23/2003  T:  07/23/2003  Job:  [id number]

## 2010-11-21 NOTE — Op Note (Signed)
NAME:  [name], [name]                  ACCOUNT NO.:  [id number]   MEDICAL RECORD NO.:  [id number]                   PATIENT TYPE:  AMB   LOCATION:  DSC                                  FACILITY:  [facility]   PHYSICIAN:  John L. Rendall III, M.D.           DATE OF BIRTH:  [birth year]   DATE OF PROCEDURE:  06/27/2003  DATE OF DISCHARGE:                                 OPERATIVE REPORT   INDICATION AND JUSTIFICATION FOR PROCEDURE:  History of infection of left  second toe status post fusion with percutaneous screw.   PROCEDURE:  Removal of screw and debridement of osteomyelitis, second toe.   PREOPERATIVE DIAGNOSIS:  Infected screw and local osteomyelitis of second  toe.   POSTOPERATIVE DIAGNOSIS:  Infected screw and local osteomyelitis of second  toe.   SURGEON:  John L. Rendall, M.D.   ANESTHESIA:  Metatarsal block.   PROCEDURE:  Under metatarsal block anesthesia the left foot was prepared  with Duraprep and draped as a sterile field.  An ankle Esmarch is applied. A  1 cm incision is made in the end of the second toe beneath the nail. The  buried [employer] screw is found and it is backed out.  A small curette is  then placed up the tract the screw had been in and is used to curette tissue  out.  No pus was found.  The patient has been off antibiotics for two days  and was previously on Zithromax. A culture aerobic and anaerobic is  obtained. A rubber band drain is inserted up the center of the toe.  The  wound is then left open and a sterile dressing, compression bandage and  wooden shoe are applied.  The patient is sent home with Vicodin and Biaxin.  She is to return to the office next Tuesday.                                               John L. Dorothyann Gibbs, M.D.    Renato Gails  D:  06/27/2003  T:  06/28/2003  Job:  [id number]

## 2010-12-02 ENCOUNTER — Ambulatory Visit (INDEPENDENT_AMBULATORY_CARE_PROVIDER_SITE_OTHER): Payer: Medicare Other

## 2010-12-02 DIAGNOSIS — J45909 Unspecified asthma, uncomplicated: Secondary | ICD-10-CM

## 2010-12-02 MED ORDER — OMALIZUMAB 150 MG ~~LOC~~ SOLR
375.0000 mg | Freq: Once | SUBCUTANEOUS | Status: AC
  Administered 2010-12-02: 375 mg via SUBCUTANEOUS

## 2010-12-08 ENCOUNTER — Encounter: Payer: Self-pay | Admitting: Internal Medicine

## 2010-12-15 ENCOUNTER — Ambulatory Visit (INDEPENDENT_AMBULATORY_CARE_PROVIDER_SITE_OTHER): Payer: Medicare Other

## 2010-12-15 DIAGNOSIS — J45909 Unspecified asthma, uncomplicated: Secondary | ICD-10-CM

## 2010-12-17 MED ORDER — OMALIZUMAB 150 MG ~~LOC~~ SOLR
375.0000 mg | Freq: Once | SUBCUTANEOUS | Status: AC
  Administered 2010-12-17: 375 mg via SUBCUTANEOUS

## 2010-12-29 ENCOUNTER — Ambulatory Visit (INDEPENDENT_AMBULATORY_CARE_PROVIDER_SITE_OTHER): Payer: Medicare Other

## 2010-12-29 DIAGNOSIS — J45909 Unspecified asthma, uncomplicated: Secondary | ICD-10-CM

## 2010-12-29 MED ORDER — OMALIZUMAB 150 MG ~~LOC~~ SOLR
375.0000 mg | Freq: Once | SUBCUTANEOUS | Status: AC
  Administered 2010-12-29: 375 mg via SUBCUTANEOUS

## 2011-01-13 ENCOUNTER — Ambulatory Visit (INDEPENDENT_AMBULATORY_CARE_PROVIDER_SITE_OTHER): Payer: Medicare Other

## 2011-01-13 DIAGNOSIS — J45909 Unspecified asthma, uncomplicated: Secondary | ICD-10-CM

## 2011-01-13 MED ORDER — OMALIZUMAB 150 MG ~~LOC~~ SOLR
375.0000 mg | Freq: Once | SUBCUTANEOUS | Status: AC
  Administered 2011-01-13: 375 mg via SUBCUTANEOUS

## 2011-01-27 ENCOUNTER — Ambulatory Visit: Payer: Medicare Other

## 2011-01-28 ENCOUNTER — Ambulatory Visit (INDEPENDENT_AMBULATORY_CARE_PROVIDER_SITE_OTHER): Payer: Medicare Other

## 2011-01-28 DIAGNOSIS — J45909 Unspecified asthma, uncomplicated: Secondary | ICD-10-CM

## 2011-01-28 MED ORDER — OMALIZUMAB 150 MG ~~LOC~~ SOLR
375.0000 mg | Freq: Once | SUBCUTANEOUS | Status: AC
  Administered 2011-01-28: 375 mg via SUBCUTANEOUS

## 2011-02-10 ENCOUNTER — Ambulatory Visit: Payer: Medicare Other

## 2011-02-11 ENCOUNTER — Ambulatory Visit (INDEPENDENT_AMBULATORY_CARE_PROVIDER_SITE_OTHER): Payer: Medicare Other

## 2011-02-11 DIAGNOSIS — J45909 Unspecified asthma, uncomplicated: Secondary | ICD-10-CM

## 2011-02-11 MED ORDER — OMALIZUMAB 150 MG ~~LOC~~ SOLR
375.0000 mg | Freq: Once | SUBCUTANEOUS | Status: AC
  Administered 2011-02-11: 375 mg via SUBCUTANEOUS

## 2011-02-23 ENCOUNTER — Inpatient Hospital Stay (HOSPITAL_COMMUNITY)
Admission: EM | Admit: 2011-02-23 | Discharge: 2011-02-25 | DRG: 069 | Disposition: A | Discharge status: Home Health | Payer: Medicare Other | Attending: Internal Medicine | Admitting: Internal Medicine

## 2011-02-23 ENCOUNTER — Emergency Department (HOSPITAL_COMMUNITY): Payer: Medicare Other

## 2011-02-23 DIAGNOSIS — D72829 Elevated white blood cell count, unspecified: Secondary | ICD-10-CM | POA: Diagnosis present

## 2011-02-23 DIAGNOSIS — I2510 Atherosclerotic heart disease of native coronary artery without angina pectoris: Secondary | ICD-10-CM | POA: Diagnosis present

## 2011-02-23 DIAGNOSIS — Z9861 Coronary angioplasty status: Secondary | ICD-10-CM

## 2011-02-23 DIAGNOSIS — I4891 Unspecified atrial fibrillation: Secondary | ICD-10-CM | POA: Diagnosis present

## 2011-02-23 DIAGNOSIS — Z79899 Other long term (current) drug therapy: Secondary | ICD-10-CM

## 2011-02-23 DIAGNOSIS — Z880 Allergy status to penicillin: Secondary | ICD-10-CM

## 2011-02-23 DIAGNOSIS — I10 Essential (primary) hypertension: Secondary | ICD-10-CM | POA: Diagnosis present

## 2011-02-23 DIAGNOSIS — Z882 Allergy status to sulfonamides status: Secondary | ICD-10-CM

## 2011-02-23 DIAGNOSIS — G459 Transient cerebral ischemic attack, unspecified: Principal | ICD-10-CM | POA: Diagnosis present

## 2011-02-23 DIAGNOSIS — Z7901 Long term (current) use of anticoagulants: Secondary | ICD-10-CM

## 2011-02-23 DIAGNOSIS — Z87898 Personal history of other specified conditions: Secondary | ICD-10-CM

## 2011-02-23 DIAGNOSIS — E785 Hyperlipidemia, unspecified: Secondary | ICD-10-CM | POA: Diagnosis present

## 2011-02-23 DIAGNOSIS — J45909 Unspecified asthma, uncomplicated: Secondary | ICD-10-CM | POA: Diagnosis present

## 2011-02-23 DIAGNOSIS — Z7982 Long term (current) use of aspirin: Secondary | ICD-10-CM

## 2011-02-23 LAB — CBC
HCT: 38.2 % (ref 36.0–46.0)
Hemoglobin: 13.2 g/dL (ref 12.0–15.0)
MCHC: 34.6 g/dL (ref 30.0–36.0)
RBC: 4.44 MIL/uL (ref 3.87–5.11)

## 2011-02-23 LAB — URINE MICROSCOPIC-ADD ON

## 2011-02-23 LAB — COMPREHENSIVE METABOLIC PANEL
ALT: 17 U/L (ref 0–35)
AST: 23 U/L (ref 0–37)
Albumin: 3.8 g/dL (ref 3.5–5.2)
Alkaline Phosphatase: 53 U/L (ref 39–117)
Chloride: 103 mEq/L (ref 96–112)
Potassium: 3.8 mEq/L (ref 3.5–5.1)
Sodium: 139 mEq/L (ref 135–145)
Total Bilirubin: 0.3 mg/dL (ref 0.3–1.2)
Total Protein: 7.2 g/dL (ref 6.0–8.3)

## 2011-02-23 LAB — URINALYSIS, ROUTINE W REFLEX MICROSCOPIC
Bilirubin Urine: NEGATIVE
Glucose, UA: NEGATIVE mg/dL
Hgb urine dipstick: NEGATIVE
Ketones, ur: NEGATIVE mg/dL
Protein, ur: NEGATIVE mg/dL
pH: 6.5 (ref 5.0–8.0)

## 2011-02-23 LAB — DIGOXIN LEVEL: Digoxin Level: 0.8 ng/mL (ref 0.8–2.0)

## 2011-02-23 LAB — PROTIME-INR
INR: 2.75 — ABNORMAL HIGH (ref 0.00–1.49)
Prothrombin Time: 29.5 seconds — ABNORMAL HIGH (ref 11.6–15.2)

## 2011-02-23 LAB — APTT: aPTT: 48 seconds — ABNORMAL HIGH (ref 24–37)

## 2011-02-24 ENCOUNTER — Inpatient Hospital Stay (HOSPITAL_COMMUNITY): Payer: Medicare Other

## 2011-02-24 ENCOUNTER — Emergency Department (HOSPITAL_COMMUNITY): Payer: Medicare Other

## 2011-02-24 LAB — COMPREHENSIVE METABOLIC PANEL
ALT: 15 U/L (ref 0–35)
AST: 19 U/L (ref 0–37)
Albumin: 3.2 g/dL — ABNORMAL LOW (ref 3.5–5.2)
Alkaline Phosphatase: 45 U/L (ref 39–117)
Potassium: 4 mEq/L (ref 3.5–5.1)
Sodium: 142 mEq/L (ref 135–145)
Total Protein: 6.1 g/dL (ref 6.0–8.3)

## 2011-02-24 LAB — CBC
HCT: 34.9 % — ABNORMAL LOW (ref 36.0–46.0)
Platelets: 161 10*3/uL (ref 150–400)
RBC: 4.04 MIL/uL (ref 3.87–5.11)
RDW: 14.4 % (ref 11.5–15.5)
WBC: 9.6 10*3/uL (ref 4.0–10.5)

## 2011-02-24 LAB — LIPID PANEL
Cholesterol: 120 mg/dL (ref 0–200)
Triglycerides: 63 mg/dL (ref <150)

## 2011-02-24 LAB — DIFFERENTIAL
Basophils Absolute: 0 10*3/uL (ref 0.0–0.1)
Eosinophils Relative: 6 % — ABNORMAL HIGH (ref 0–5)
Lymphocytes Relative: 30 % (ref 12–46)
Neutrophils Relative %: 57 % (ref 43–77)

## 2011-02-24 LAB — PROTIME-INR: INR: 2.9 — ABNORMAL HIGH (ref 0.00–1.49)

## 2011-02-24 LAB — CARDIAC PANEL(CRET KIN+CKTOT+MB+TROPI)
CK, MB: 2.6 ng/mL (ref 0.3–4.0)
Troponin I: <0.3 ng/mL (ref <0.30)

## 2011-02-25 ENCOUNTER — Ambulatory Visit: Payer: Medicare Other

## 2011-02-25 LAB — PROTIME-INR
INR: 2.94 — ABNORMAL HIGH (ref 0.00–1.49)
Prothrombin Time: 31.1 seconds — ABNORMAL HIGH (ref 11.6–15.2)

## 2011-03-03 ENCOUNTER — Ambulatory Visit (INDEPENDENT_AMBULATORY_CARE_PROVIDER_SITE_OTHER): Payer: Medicare Other

## 2011-03-03 DIAGNOSIS — J45909 Unspecified asthma, uncomplicated: Secondary | ICD-10-CM

## 2011-03-05 DIAGNOSIS — G459 Transient cerebral ischemic attack, unspecified: Secondary | ICD-10-CM | POA: Insufficient documentation

## 2011-03-06 MED ORDER — OMALIZUMAB 150 MG ~~LOC~~ SOLR
375.0000 mg | Freq: Once | SUBCUTANEOUS | Status: AC
  Administered 2011-03-06: 375 mg via SUBCUTANEOUS

## 2011-03-10 NOTE — H&P (Signed)
NAMECONSANDRA, [name]        ACCOUNT NO.:  [id number]  MEDICAL RECORD NO.:  [id number]  LOCATION:  [facility]                         FACILITY:  [facility]  PHYSICIAN:  [name], MDDATE OF BIRTH:  [birth year]  DATE OF ADMISSION:  02/23/2011 DATE OF DISCHARGE:                             HISTORY & PHYSICAL   PRIMARY CARE PHYSICIAN:  Janice R. Collins Scotland, MD  PRIMARY CARDIOLOGIST:  Janice Magic, MD  CHIEF COMPLAINT:  Difficulty walking.  HISTORY OF PRESENT ILLNESS:  An 77 year old female with known history of atrial fibrillation on Coumadin, history of CAD status post LAD stenting, severe asthma on Xolair IV every 2 weeks.  At around 7:30 p.m. yesterday, suddenly she developed difficulty walking while she was in the kitchen.  She felt completely confused and was not knowing what was going on and she tried to walk back to the den when she fell that she was not having good balance, and she felt her left upper extremity was slightly weak.  She lied down on the couch and her daughters came immediately after she called them and the family felt that she had some left-sided facial droop and also had some slurred speech.  She did not have any difficulty speaking or any difficulty swallowing.  Did not have any headache or lose consciousness.  The whole symptoms lasted for around half an hour and completely resolved and was brought to the ER. In the ER, the patient had a CT head, which was negative.  At this time, the patient has been admitted for further observation.  The patient is not a candidate for TIA as the patient's symptoms have completely resolved.  The patient denies any chest pain, shortness of breath.  Denies any nausea, vomiting, abdominal pain, dysuria, discharge.  Denies any shortness of breath, cough, or phlegm.  Denies any weakness in upper and lower extremities at this time and is able to move upper and lower extremities 5/5.  PAST MEDICAL HISTORY: 1. History of  paroxysmal atrial fibrillation on Coumadin which is     therapeutic and intolerance to beta blockers as well as calcium     channel blocker. 2. History of CAD, status post LAD stenting. 3. History of severe asthma. 4. History of hypertension. 5. History of lymphoma.  PAST SURGICAL HISTORY:  Cholecystectomy, LAD stenting, lumbar spine surgery and bladder tack surgery, spinal surgery.  MEDICATIONS ON ADMISSION:  Has to be verified includes: 1. Advair Diskus 550 one puff b.i.d. 2. Aspirin. 3. Combivent inhaler. 4. Digoxin 0.15 p.o. mg daily. 5. Exforge HCT. 6. Simvastatin.  ALLERGIES:  CIPRO, LEVAQUIN, PENICILLIN, and SULFA.  SOCIAL HISTORY:  The patient denies smoking.  Previously drinking alcohol, not use any illegal drugs.  Lives with her granddaughter.  FAMILY HISTORY:  Positive for breast cancer and the patient is aware of breast cancer screening and her sister also had TIA.  REVIEW OF SYSTEMS:  As per history of present illness, nothing else significant.  PHYSICAL EXAMINATION:  GENERAL:  The patient examined at bedside not in acute distress. VITAL SIGNS:  Blood pressure 115/50, pulse 95, temperature 97.8, respirations are 20 per minute, O2 sat 100%. HEENT: Anicteric.  No pallor noticed.  No facial asymmetry.  Tongue is midline.  The patient is able to see in both eyes, count in both eyes. PLA positive.  No neck rigidity.  No discharge from ears, eyes, nose or mouth. CHEST:  Bilateral air entry present.  No rhonchi or crepitation. HEART:  S1 and S2 heard. ABDOMEN:  Soft, nontender.  Bowel sounds heard. CNS:  The patient is alert, awake, oriented to time, place, and person. EXTREMITIES:  Her is moving upper and lower 5/5.  There is no pronator drift.  No dysdiadochokinesia or no ataxia.  EXTREMITIES:  Peripheral pulses felt.  No acute ischemic changes, cyanosis, or clubbing.  LABORATORY DATA:  EKG shows normal sinus rhythm with heart rate around 97 beats with  first-degree AV block with nonspecific ST-T changes.  EKG changes are comparable with old EKG in March 05, 2009.  CT head without contrast shows no acute no acute intracranial pathology seen on CT, mild cortical volume loss with scattered small vessel ischemic microangiopathy, opacification of the right frontal sinus and mucosal thickening within the right side of the sphenoid sinus.  CBC; WBC 11.1, hemoglobin 13.2, hematocrit 38.2, platelets 194.  PT/INR is 29.7 and 2.7.  Complete metabolic panel; sodium 139, potassium 3.8, chloride 103, carbon dioxide 27, glucose 127, BUN 11, creatinine 0.7, total bilirubin is 0.3, alkaline phosphatase is 53, AST 23, ALT 17, total protein 7.2, albumin 3.8 calcium 9.2.  Digoxin 0.8.  UA shows small leukocytes, wbc's 3-6.  ASSESSMENT: 1. Transient ischemic attack. 2. Atrial fibrillation, presently rate controlled on Coumadin which is     therapeutic. 3. Severe asthma.  Presently not in any exacerbation. 4. History of hypertension. 5. History of coronary artery disease, status post stenting. 6. Mild leukocytosis.  PLAN: 1. At this time, the patient is in telemetry. 2. For TIA, the patient will be placed on neuro checks, get MRI of the     brain, MRA of the pancolitis double 2-D echo.  We will continue     Coumadin which is therapeutic at this time. 3. History of atrial fibrillation, presently rate control.  The     patient on digoxin and Coumadin, which we will continue.  Coumadin     be dosed per pharmacy. 4. History of hypertension.  We will need to verify home dose of     Exforge HCT. 5. History of asthma, presently not in exacerbation.  We will continue     Advair Diskus and the patient was taken IV infusion of Xolair.     This can be continued as an outpatient. 6. The patient does have mild leukocytosis, acute showing possible     UTI.  Given the history of multiple antibiotic allergies, we will     get urine cultures and will see if there  is any growth.  Based on     that we will decide the patient needs antibiotics and also we will     get a portable chest x-ray if possible and repeat CBC again and we     have to make sure that leukocytosis does not worsen. 7. Further recommendation as condition evolves.     Janice Clos, MD     ANK/MEDQ  D:  02/24/2011  T:  02/24/2011  Job:  [id number]  cc:   [name], M.D. [name], M.D.  Electronically Signed by Midge Minium MD on 03/10/2011 09:30:58 AM

## 2011-03-11 NOTE — Discharge Summary (Signed)
[name], [name]        ACCOUNT NO.:  [id number]  MEDICAL RECORD NO.:  [id number]  LOCATION:  3738                         FACILITY:  [facility]  PHYSICIAN:  Clydia Llano, MD       DATE OF BIRTH:  11/20/1923  DATE OF ADMISSION:  02/23/2011 DATE OF DISCHARGE:  02/25/2011                              DISCHARGE SUMMARY   REASON FOR ADMISSION:  Difficulty with walking.  DISCHARGE DIAGNOSES: 1. Transient ischemic attack. 2. Paroxysmal atrial fibrillation, on Coumadin. 3. History of coronary artery disease, status post LAD stenting. 4. Severe asthma, controlled. 5. Hypertension. 6. History of lymphoma. 7. Dyslipidemia.  DISCHARGE MEDICATIONS: 1. Advair Diskus 500/50 1 puff inhale b.i.d. 2. Aspirin 81 mg p.o. every other day. 3. CoQ10 200 mg p.o. daily. 4. Exforge 10/320 mg at bedtime. 5. Furosemide 40 mg daily as needed for swelling. 6. Digoxin 0.125 mg p.o. daily. 7. Simvastatin 5 mg p.o. at bedtime. 8. Coumadin 5 mg, take three-fourth of a tablet daily. 9. Xalatan 0.005% 1 drop in both eyes daily at bedtime. 10.Xanax 0.5 1 tablet daily as needed for anxiety.  RADIOLOGY: 1. MRI of the brain showed no acute infarct.  There are mild to     moderate small-vessel disease-type changes.  There is global     atrophy without hydrocephalous.  There is paranasal sinus     opacification/mucosal thickening, especially in the right frontal     sinus and anterior ethmoid sinus. 2. MRA of the head showed mild branch-vessel irregularity without     medium- or large-vessel significant stenosis or occlusion. 3. Chest x-ray showed no acute cardiopulmonary process. 4. CT head showed no acute intracranial pathology seen.  There is mild     cortical volume loss and scattered small-vessel ischemic     microangiopathy.  There is opacification of the right frontal sinus     and mucosal thickening of the right side of the sphenoid sinus.  BRIEF HISTORY AND EXAMINATION:  [name] is  an 77 year old Caucasian female who has paroxysmal atrial fibrillation, on Coumadin. The patient had CAD, status post LAD stenting, with severe asthma.  The patient presented to the hospital with difficulty on walking.  About 7:30 the night before admission, the patient developed difficulty with walking.  In the kitchen she felt completely confused with not knowing where she was going.  She tried to walk back to the den when she felt that she was not having good balance.  She felt also her left upper extremity was slightly weak.  She laid down on the couch and her daughter came immediately after she called her, and the family felt that she had some left-sided facial droop and some slurred speech.  She did not have any difficulty with speaking or difficulty with swallowing. She did not have any headache or loss of consciousness.  The patient was brought to the emergency department.  CT scan of the head was negative. Symptoms resolved completely after 30 to 60 minutes.  The patient was admitted to the hospital for further evaluation.  BRIEF HOSPITAL COURSE: 1. TIA:  According to the symptoms the patient had and the resolution     of her symptoms, CT negativity and negativity of  MRI of the brain,     TIA diagnosis was made.  The patient did have also a 2-D     echocardiogram, which showed ejection fraction of about 68% with     grade 1 diastolic dysfunction.  The carotid Dopplers are pending at     the time of discharge but the preliminary diagnoses was patent     carotids.  The patient's LDL is controlled.  The patient was seen     by Physical Therapy who recommended Home Health PT versus     outpatient PT depending on the progress.  The patient will be     discharged home and Home Health Services will be arranged. 2. Paroxysmal atrial fibrillation:  While the patient was in the     hospital here she had normal sinus rhythm with rate in the 90s.     The patient is on chronic Coumadin,  that was continued throughout     the hospital stay.  On the day of discharge her PT is 2.9. 3. Dyslipidemia:  Her cholesterol is controlled with 5 mg of     simvastatin and she is doing a heart-healthy diet.  Total     cholesterol was 120, triglycerides 63, HDL 62 and LDL 45.  The     patient is continued on her current diet. 4. Questionable UTI:  At the time of admission, UA showed small     leukocyte esterase and 3 to 6 pus cells.  The culture was sent.  At     the time of discharge, culture was still pending.  The patient     denies any UTI symptoms, so antibiotic was not started.  The     patient did not have any temperature or leukocytosis at the time of     discharge. 5. Hypertension, pretty-controlled.  Her blood pressure medication was     restarted, it is Exforge.  The patient to follow up with her     primary care physician.  DISCHARGE INSTRUCTIONS: 1. Activity:  As tolerated. 2. Diet:  Heart-healthy diet. 3. Disposition:  Home.     Clydia Llano, MD     ME/MEDQ  D:  02/25/2011  T:  02/25/2011  Job:  [id number]  cc:   Armanda Magic, M.D. Fax: [phone]  Tammy R. Collins Scotland, M.D. Fax: [phone]  Electronically Signed by Clydia Llano  on 03/11/2011 03:04:23 PM

## 2011-03-17 ENCOUNTER — Ambulatory Visit (INDEPENDENT_AMBULATORY_CARE_PROVIDER_SITE_OTHER): Payer: Medicare Other

## 2011-03-17 DIAGNOSIS — J45909 Unspecified asthma, uncomplicated: Secondary | ICD-10-CM

## 2011-03-17 MED ORDER — OMALIZUMAB 150 MG ~~LOC~~ SOLR
375.0000 mg | Freq: Once | SUBCUTANEOUS | Status: AC
  Administered 2011-03-17: 375 mg via SUBCUTANEOUS

## 2011-03-30 LAB — DIGOXIN LEVEL: Digoxin Level: 0.3 — ABNORMAL LOW

## 2011-03-30 LAB — POCT CARDIAC MARKERS
Myoglobin, poc: 80.4
Operator id: 192351
Troponin i, poc: <0.05

## 2011-03-30 LAB — POCT I-STAT CREATININE
Creatinine, Ser: 1.1
Operator id: 192351

## 2011-03-30 LAB — I-STAT 8, (EC8 V) (CONVERTED LAB)
Acid-Base Excess: 1
Chloride: 104
TCO2: 28
pCO2, Ven: 46.1
pH, Ven: 7.368 — ABNORMAL HIGH

## 2011-03-31 ENCOUNTER — Ambulatory Visit (INDEPENDENT_AMBULATORY_CARE_PROVIDER_SITE_OTHER): Payer: Medicare Other

## 2011-03-31 DIAGNOSIS — J45909 Unspecified asthma, uncomplicated: Secondary | ICD-10-CM

## 2011-03-31 MED ORDER — OMALIZUMAB 150 MG ~~LOC~~ SOLR
375.0000 mg | Freq: Once | SUBCUTANEOUS | Status: AC
  Administered 2011-03-31: 375 mg via SUBCUTANEOUS

## 2011-04-08 ENCOUNTER — Ambulatory Visit (INDEPENDENT_AMBULATORY_CARE_PROVIDER_SITE_OTHER): Payer: Medicare Other

## 2011-04-08 DIAGNOSIS — Z23 Encounter for immunization: Secondary | ICD-10-CM

## 2011-04-08 LAB — POCT HEMOGLOBIN-HEMACUE: Hemoglobin: 14.3

## 2011-04-08 IMAGING — CR DG CHEST 2V
2 series · 2 of 2 positions shown · non-contrast
Comparison: 02/08/2009.

CLINICAL DATA: Asthma.  Atrial fibrillation.  High blood pressure.
Prior smoker.

CHEST - 2 VIEW

[view not recorded (1 of 2)]
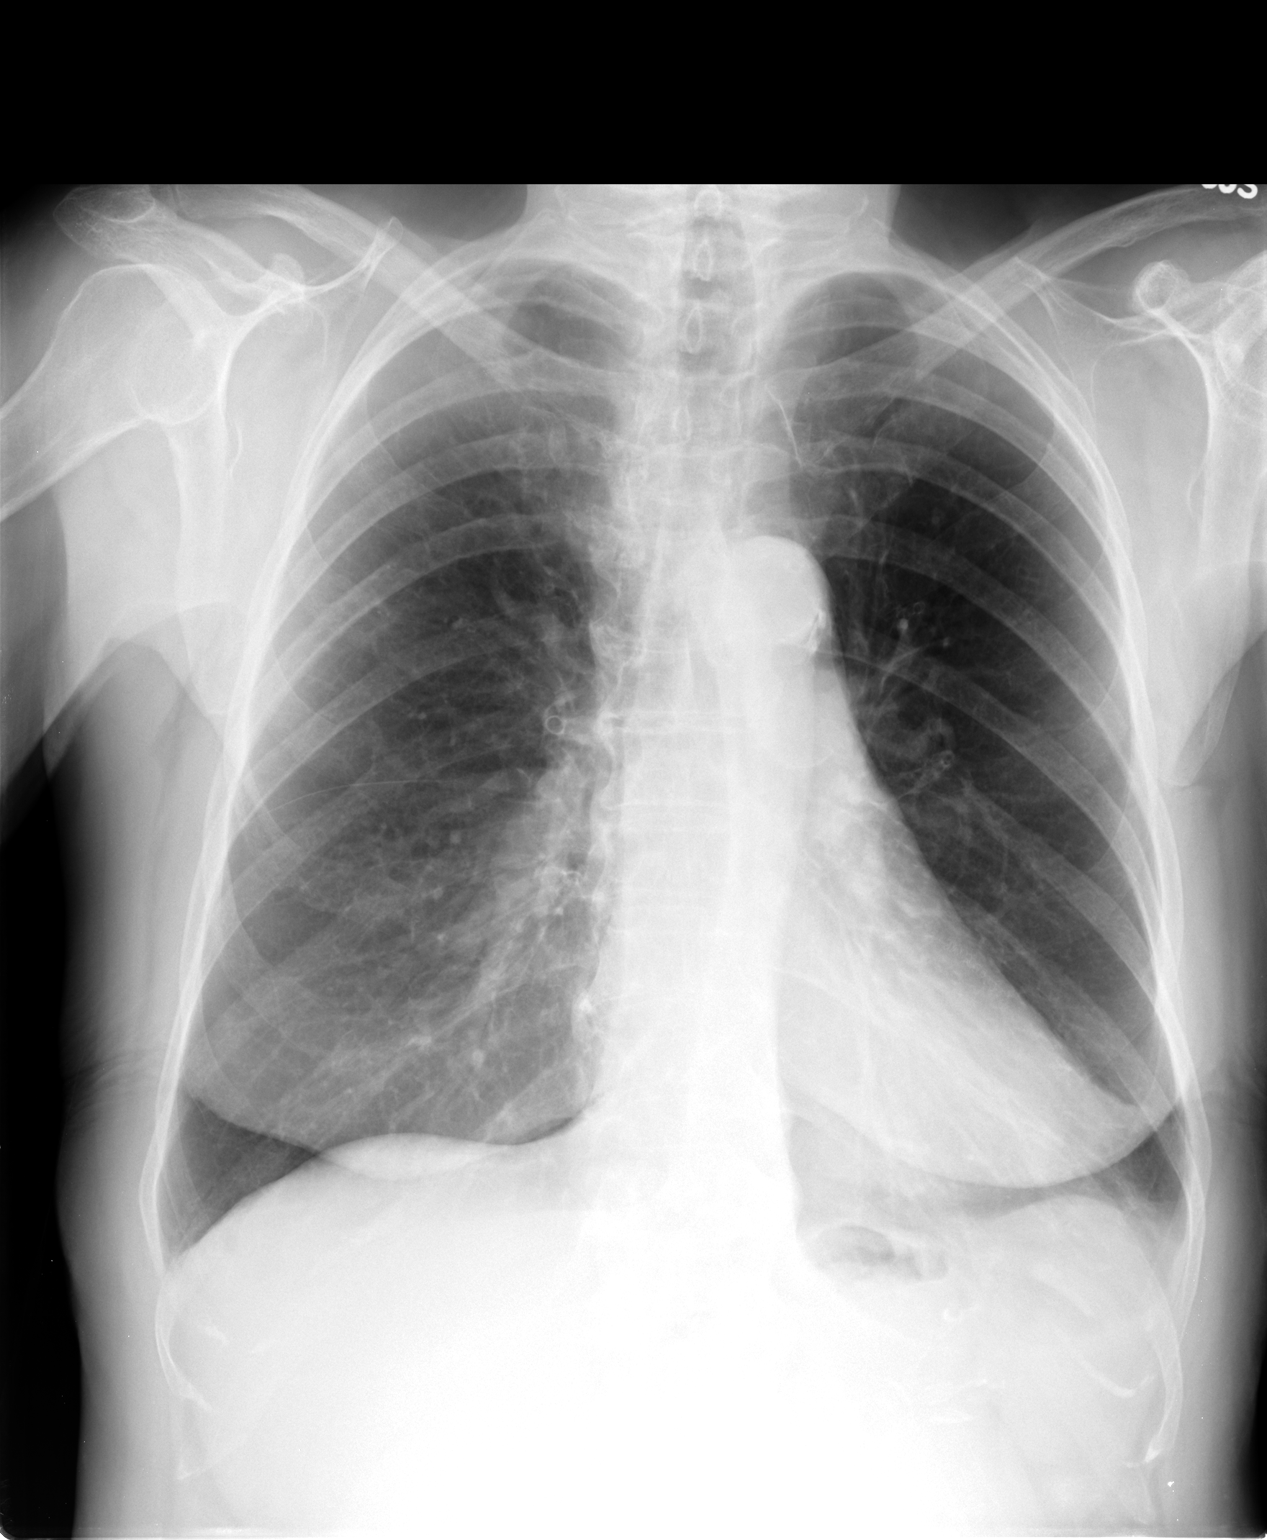

[view not recorded (2 of 2)]
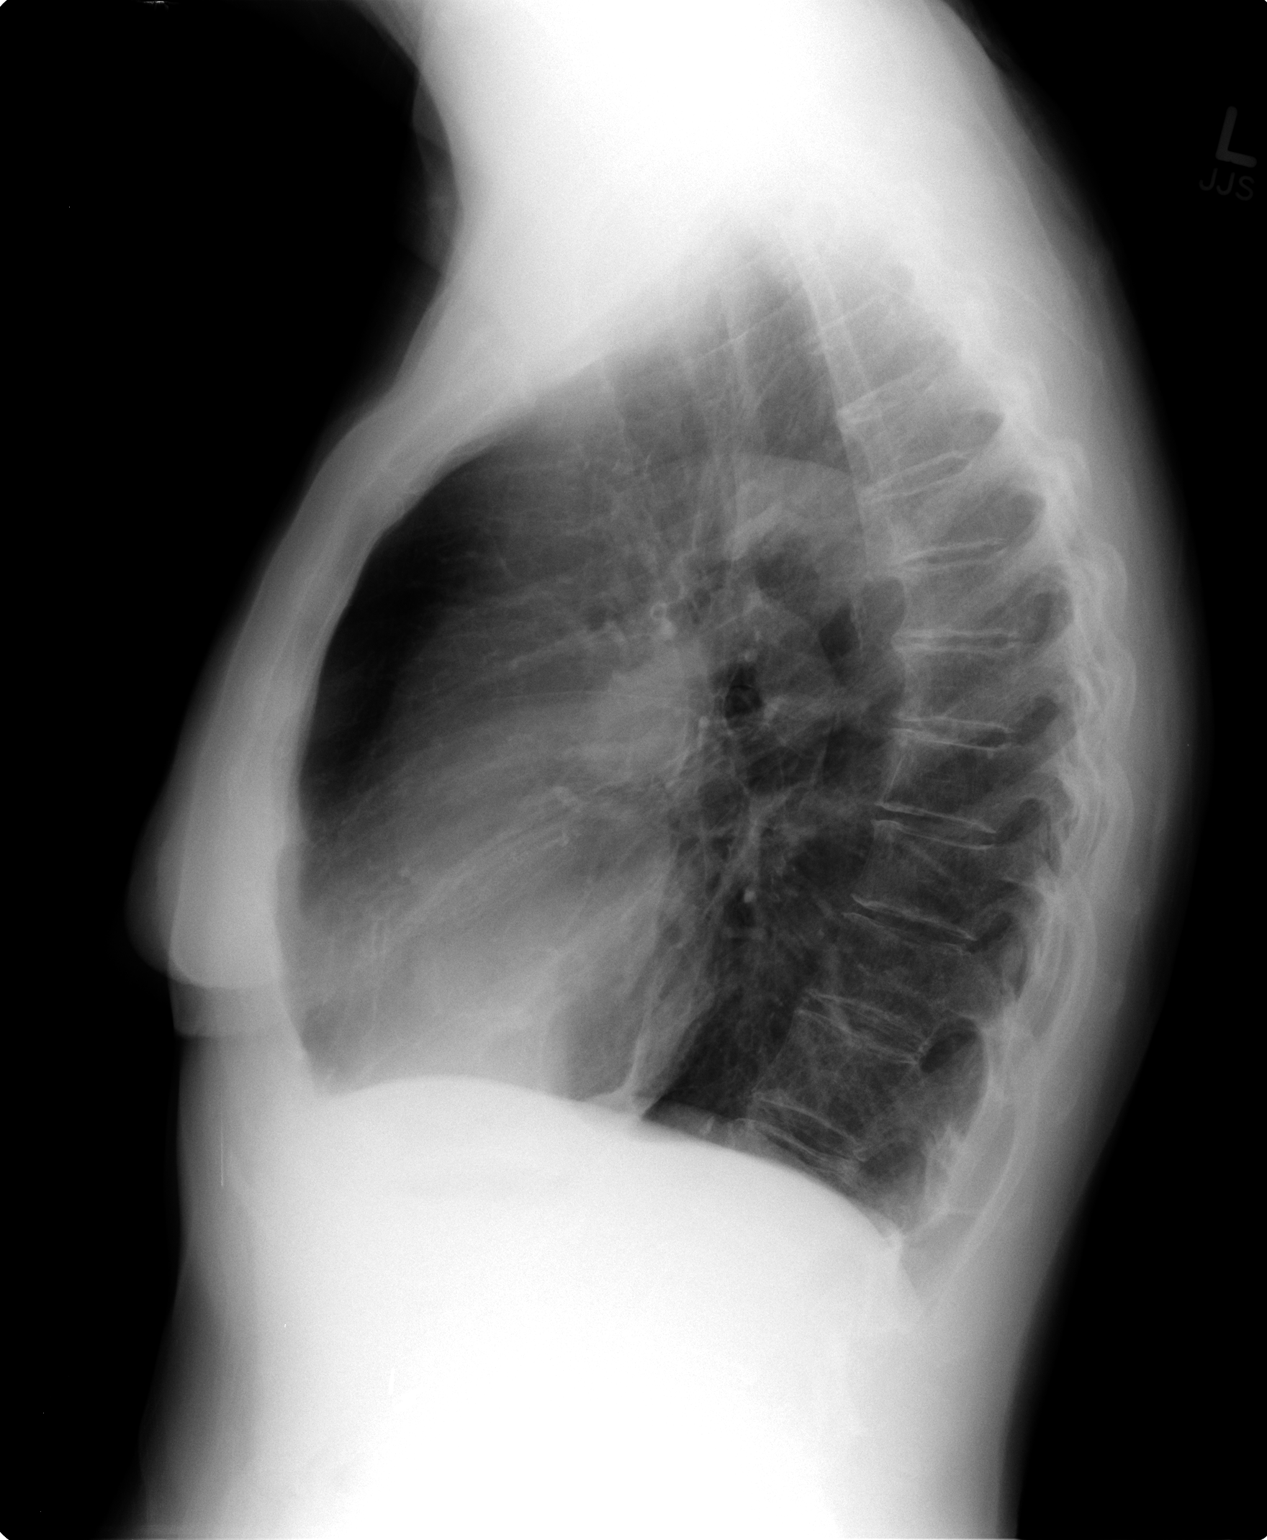

[2 of 2 positions shown; findings below may reference images not displayed]

FINDINGS: Peribronchial thickening consistent with chronic
bronchitis stable.  No infiltrate, congestive heart failure or
pneumothorax.

Heart size within normal limits.  Calcified aorta.
IMPRESSION: Chronic changes without acute abnormality.

## 2011-04-14 ENCOUNTER — Ambulatory Visit: Payer: Medicare Other

## 2011-04-15 ENCOUNTER — Ambulatory Visit (INDEPENDENT_AMBULATORY_CARE_PROVIDER_SITE_OTHER): Payer: Medicare Other

## 2011-04-15 DIAGNOSIS — J45909 Unspecified asthma, uncomplicated: Secondary | ICD-10-CM

## 2011-04-16 DIAGNOSIS — J45909 Unspecified asthma, uncomplicated: Secondary | ICD-10-CM

## 2011-04-16 MED ORDER — OMALIZUMAB 150 MG ~~LOC~~ SOLR
375.0000 mg | Freq: Once | SUBCUTANEOUS | Status: AC
  Administered 2011-04-16: 375 mg via SUBCUTANEOUS

## 2011-04-28 ENCOUNTER — Ambulatory Visit (INDEPENDENT_AMBULATORY_CARE_PROVIDER_SITE_OTHER): Payer: Medicare Other

## 2011-04-28 DIAGNOSIS — J45909 Unspecified asthma, uncomplicated: Secondary | ICD-10-CM

## 2011-04-29 DIAGNOSIS — J45909 Unspecified asthma, uncomplicated: Secondary | ICD-10-CM

## 2011-04-29 MED ORDER — OMALIZUMAB 150 MG ~~LOC~~ SOLR
375.0000 mg | Freq: Once | SUBCUTANEOUS | Status: AC
  Administered 2011-04-29: 375 mg via SUBCUTANEOUS

## 2011-05-11 ENCOUNTER — Ambulatory Visit: Payer: MEDICARE | Admitting: Pulmonary Disease

## 2011-05-11 ENCOUNTER — Encounter: Payer: Self-pay | Admitting: Pulmonary Disease

## 2011-05-11 ENCOUNTER — Ambulatory Visit (INDEPENDENT_AMBULATORY_CARE_PROVIDER_SITE_OTHER): Payer: Medicare Other | Admitting: Pulmonary Disease

## 2011-05-11 ENCOUNTER — Ambulatory Visit (INDEPENDENT_AMBULATORY_CARE_PROVIDER_SITE_OTHER): Payer: Medicare Other

## 2011-05-11 VITALS — BP 130/52 | HR 90 | Temp 97.8°F | Ht 67.0 in | Wt 140.8 lb

## 2011-05-11 DIAGNOSIS — J45909 Unspecified asthma, uncomplicated: Secondary | ICD-10-CM

## 2011-05-11 NOTE — Assessment & Plan Note (Signed)
The patient is maintaining her usual baseline on her current bronchodilator regimen.  I have asked her to continue on this, however I would have a low threshold to add Spiriva if she has worsening symptoms.  Recent studies have shown improvement with a LAMA in those patients with fixed obstructive asthma phenotype.

## 2011-05-11 NOTE — Patient Instructions (Signed)
No change in medications Continue to stay as active as your can If doing well, followup with me in 6mos.

## 2011-05-11 NOTE — Progress Notes (Signed)
  Subjective:    Patient ID: [name], female    DOB: 12/04/1923, 77 y.o.   MRN: [id number]  HPI The patient comes in today for followup of her significant asthma with fixed airflow obstruction.  She is doing fairly well her current bronchodilator regimen, as well as Geologist, engineering.  She has not had a recent acute exacerbation or pulmonary infection.  She's been trying to stay as active as possible.  She denies any chest congestion or purulent mucus at this time.  She has already had her flu vaccine this fall.   Review of Systems  Constitutional: Negative for fever and unexpected weight change.  HENT: Positive for congestion, rhinorrhea and postnasal drip. Negative for ear pain, nosebleeds, sore throat, sneezing, trouble swallowing, dental problem and sinus pressure.   Eyes: Positive for itching. Negative for redness.  Respiratory: Positive for cough and shortness of breath. Negative for chest tightness and wheezing.   Cardiovascular: Positive for leg swelling. Negative for palpitations.  Gastrointestinal: Positive for nausea. Negative for vomiting.  Genitourinary: Negative for dysuria.  Musculoskeletal: Negative for joint swelling.  Skin: Negative for rash.  Neurological: Negative for headaches.  Hematological: Bruises/bleeds easily.  Psychiatric/Behavioral: Negative for dysphoric mood. The patient is nervous/anxious.        Objective:   Physical Exam Well-developed female in no acute distress Nose without purulence or discharge noted Chest with mild decrease in breath sounds, no wheezes or rhonchi Cardiac exam with regular rate and rhythm Lower extremities with mild ankle edema, no cyanosis noted Alert and oriented, moves all 4 extremities.       Assessment & Plan:

## 2011-05-12 ENCOUNTER — Ambulatory Visit: Payer: Medicare Other

## 2011-05-12 DIAGNOSIS — J45909 Unspecified asthma, uncomplicated: Secondary | ICD-10-CM

## 2011-05-12 MED ORDER — OMALIZUMAB 150 MG ~~LOC~~ SOLR
375.0000 mg | Freq: Once | SUBCUTANEOUS | Status: AC
  Administered 2011-05-12: 375 mg via SUBCUTANEOUS

## 2011-05-26 ENCOUNTER — Ambulatory Visit (INDEPENDENT_AMBULATORY_CARE_PROVIDER_SITE_OTHER): Payer: Medicare Other

## 2011-05-26 DIAGNOSIS — J45909 Unspecified asthma, uncomplicated: Secondary | ICD-10-CM

## 2011-05-26 MED ORDER — OMALIZUMAB 150 MG ~~LOC~~ SOLR
375.0000 mg | Freq: Once | SUBCUTANEOUS | Status: AC
  Administered 2011-05-26: 375 mg via SUBCUTANEOUS

## 2011-06-09 ENCOUNTER — Ambulatory Visit: Payer: Medicare Other

## 2011-06-10 ENCOUNTER — Ambulatory Visit (INDEPENDENT_AMBULATORY_CARE_PROVIDER_SITE_OTHER): Payer: Medicare Other

## 2011-06-10 DIAGNOSIS — J45909 Unspecified asthma, uncomplicated: Secondary | ICD-10-CM

## 2011-06-10 MED ORDER — OMALIZUMAB 150 MG ~~LOC~~ SOLR
375.0000 mg | Freq: Once | SUBCUTANEOUS | Status: AC
  Administered 2011-06-10: 375 mg via SUBCUTANEOUS

## 2011-06-18 ENCOUNTER — Telehealth: Payer: Self-pay | Admitting: Pulmonary Disease

## 2011-06-18 NOTE — Telephone Encounter (Signed)
Spoke with patient-has been on Xolair for 3 years now;states she is now having a rash, itchy feelings, and dizziness. She has seen her PCP and was given topical prednisone and atarax-she is so concerned that its truly the Xolair causing it-therefore I made appt with [facility] in Friday at 1115am; Aundra Millet is aware of this.

## 2011-06-19 ENCOUNTER — Ambulatory Visit (INDEPENDENT_AMBULATORY_CARE_PROVIDER_SITE_OTHER): Payer: Medicare Other | Admitting: Pulmonary Disease

## 2011-06-19 ENCOUNTER — Encounter: Payer: Self-pay | Admitting: Pulmonary Disease

## 2011-06-19 VITALS — BP 140/70 | HR 63 | Temp 97.8°F | Ht 66.5 in | Wt 141.2 lb

## 2011-06-19 DIAGNOSIS — J45909 Unspecified asthma, uncomplicated: Secondary | ICD-10-CM

## 2011-06-19 NOTE — Patient Instructions (Signed)
Will hold xolair for next 4 weeks.  If the rash does not resolve, then this is not due to the xolair.  If it resolves, would challenge you again with xolair to see if returns. Will change advair to dulera 100/5 2 inhalations am and pm everyday to see if hoarseness resolves.  Rinse mouth well.  Keep me informed with how things are going.  Call me just before you are due for xolair shot after the 4 weeks of holding the injection.

## 2011-06-19 NOTE — Progress Notes (Signed)
  Subjective:    Patient ID: [name], female    DOB: 1923/10/15, 77 y.o.   MRN: [id number]  HPI Patient comes in today for a questionable reaction to Xolair.  She has known severe asthma, and has been doing fairly well on her current regimen.  Most recently, she began to have itching and redness of her face and eyes, with a rash on her scalp.  This occurred within 2-3 days after taking a Xolair injection, it has never occurred before.  She denies any new medications, over-the-counter products, or big change in her personal hygiene products.  She is much improved after being treated with Atarax and prednisone, but still has some mild erythema of her skin.  She never had any stridor or breathing issues.   Review of Systems  Constitutional: Negative for fever and unexpected weight change.  HENT: Positive for nosebleeds, sneezing and postnasal drip. Negative for ear pain, congestion, sore throat, rhinorrhea, trouble swallowing, dental problem and sinus pressure.   Eyes: Positive for redness and itching.  Respiratory: Positive for cough, shortness of breath and wheezing. Negative for chest tightness.   Cardiovascular: Positive for palpitations. Negative for leg swelling.  Gastrointestinal: Negative for nausea and vomiting.  Genitourinary: Negative for dysuria.  Musculoskeletal: Negative for joint swelling.  Skin: Positive for rash.  Neurological: Negative for headaches.  Hematological: Bruises/bleeds easily.  Psychiatric/Behavioral: Negative for dysphoric mood. The patient is nervous/anxious.        Objective:   Physical Exam Well-developed female in no acute distress Nose without purulence or discharge noted Mild erythema of the face, but no definite rash noted Chest with good air flow, one inspiratory squeak in the left base, but no definite wheezing. Chronic exam with regular rate and rhythm Her extremities with 1+ edema bilaterally, no cyanosis Alert and oriented, moves  all 4 extremities.        Assessment & Plan:

## 2011-06-19 NOTE — Assessment & Plan Note (Signed)
The patient has been doing well from a pulmonary standpoint on her current regimen, but has recently had a questionable reaction to Xolair.  It is unclear if it is really related to this, or something else that we have yet identified.  She is due for a Xolair shot next week, but I would like to hold the injection the next 4 weeks to see if her symptoms completely resolved.  If they do not, then I know her reaction is not related to Xolair.  If it does clear up, I would rechallenge her with a Xolair injection since her reaction was not overly severe and she has seen such an improvement in her disease with this therapy.  The patient is agreeable to this approach.  She has also noted worsening hoarseness since being on the Advair, and would like to consider another treatment.  We'll try her on dulera.

## 2011-06-24 ENCOUNTER — Ambulatory Visit: Payer: Medicare Other

## 2011-07-10 ENCOUNTER — Telehealth: Payer: Self-pay | Admitting: Pulmonary Disease

## 2011-07-10 NOTE — Telephone Encounter (Signed)
I spoke with pt and she states she still has the rash. Pt states she does not think it is due to the xolair and wants to restart it again. Pt states she is not sure what is causing the rash and she has been watching what she eats and has cut back on some of her medications. Please advise Dr. Shelle Iron, thanks

## 2011-07-10 NOTE — Telephone Encounter (Signed)
Ok to restart xolair, but she may want to consider seeing dermatology about her persistent rash.

## 2011-07-13 ENCOUNTER — Other Ambulatory Visit: Payer: Self-pay | Admitting: Pulmonary Disease

## 2011-07-13 NOTE — Telephone Encounter (Signed)
Spoke with pt and notified of recs per KC Pt verbalized understanding and states nothing further needed 

## 2011-07-15 ENCOUNTER — Ambulatory Visit (INDEPENDENT_AMBULATORY_CARE_PROVIDER_SITE_OTHER): Payer: Medicare Other

## 2011-07-15 DIAGNOSIS — J45909 Unspecified asthma, uncomplicated: Secondary | ICD-10-CM

## 2011-07-15 MED ORDER — OMALIZUMAB 150 MG ~~LOC~~ SOLR
375.0000 mg | Freq: Once | SUBCUTANEOUS | Status: AC
  Administered 2011-07-15: 375 mg via SUBCUTANEOUS

## 2011-07-24 ENCOUNTER — Telehealth: Payer: Self-pay | Admitting: Pulmonary Disease

## 2011-07-24 NOTE — Telephone Encounter (Signed)
Pt is wanting to know if her Geoffry Paradise was approved through [employer]. Pt states she has not heard anything back regarding this. She states Bjorn Loser was working on [employer]. Please advise Bjorn Loser, thanks

## 2011-07-27 NOTE — Telephone Encounter (Signed)
Returned patient's call and lmoam for pt to return my call. Advised patient on message that she could just contact [employer] herself and apply for grant. [employer] phone number is [phone]. If she preferred to speak with me, she could return my call at [phone]. Rhonda J Cobb

## 2011-07-28 ENCOUNTER — Ambulatory Visit (INDEPENDENT_AMBULATORY_CARE_PROVIDER_SITE_OTHER): Payer: Medicare Other

## 2011-07-28 DIAGNOSIS — J45909 Unspecified asthma, uncomplicated: Secondary | ICD-10-CM

## 2011-07-28 NOTE — Telephone Encounter (Signed)
Both myself and Donaciano Eva spoke with patient in regards to renewing her [employer]. Pt called and spoke with [employer] who stated that we would need to fax a letter to [facility] explaining this issue. Patient will contact me with the fax number to fax this letter to.

## 2011-07-28 NOTE — Telephone Encounter (Signed)
Patient called and spoke with [facility] and gave the fax number to [facility] to her. Donaciano Eva will fax requested information to [employer].

## 2011-07-29 DIAGNOSIS — J45909 Unspecified asthma, uncomplicated: Secondary | ICD-10-CM

## 2011-07-29 MED ORDER — OMALIZUMAB 150 MG ~~LOC~~ SOLR
375.0000 mg | Freq: Once | SUBCUTANEOUS | Status: AC
  Administered 2011-07-29: 375 mg via SUBCUTANEOUS

## 2011-08-11 ENCOUNTER — Ambulatory Visit (INDEPENDENT_AMBULATORY_CARE_PROVIDER_SITE_OTHER): Payer: Medicare Other

## 2011-08-11 DIAGNOSIS — J45909 Unspecified asthma, uncomplicated: Secondary | ICD-10-CM

## 2011-08-11 MED ORDER — OMALIZUMAB 150 MG ~~LOC~~ SOLR
375.0000 mg | Freq: Once | SUBCUTANEOUS | Status: AC
  Administered 2011-08-11: 375 mg via SUBCUTANEOUS

## 2011-08-14 ENCOUNTER — Encounter: Payer: Self-pay | Admitting: Internal Medicine

## 2011-08-25 ENCOUNTER — Ambulatory Visit (INDEPENDENT_AMBULATORY_CARE_PROVIDER_SITE_OTHER): Payer: Medicare Other

## 2011-08-25 DIAGNOSIS — J45909 Unspecified asthma, uncomplicated: Secondary | ICD-10-CM

## 2011-08-26 DIAGNOSIS — J45909 Unspecified asthma, uncomplicated: Secondary | ICD-10-CM

## 2011-08-26 MED ORDER — OMALIZUMAB 150 MG ~~LOC~~ SOLR
375.0000 mg | Freq: Once | SUBCUTANEOUS | Status: AC
  Administered 2011-08-26: 375 mg via SUBCUTANEOUS

## 2011-09-08 ENCOUNTER — Ambulatory Visit: Payer: Medicare Other

## 2011-09-15 ENCOUNTER — Ambulatory Visit (INDEPENDENT_AMBULATORY_CARE_PROVIDER_SITE_OTHER): Payer: Medicare Other

## 2011-09-15 DIAGNOSIS — J45909 Unspecified asthma, uncomplicated: Secondary | ICD-10-CM

## 2011-09-15 MED ORDER — OMALIZUMAB 150 MG ~~LOC~~ SOLR
375.0000 mg | Freq: Once | SUBCUTANEOUS | Status: AC
  Administered 2011-09-15: 375 mg via SUBCUTANEOUS

## 2011-09-23 ENCOUNTER — Other Ambulatory Visit (HOSPITAL_COMMUNITY): Payer: Self-pay | Admitting: Obstetrics & Gynecology

## 2011-09-23 DIAGNOSIS — Z1231 Encounter for screening mammogram for malignant neoplasm of breast: Secondary | ICD-10-CM

## 2011-09-29 ENCOUNTER — Telehealth: Payer: Self-pay | Admitting: Pulmonary Disease

## 2011-09-29 ENCOUNTER — Ambulatory Visit (INDEPENDENT_AMBULATORY_CARE_PROVIDER_SITE_OTHER): Payer: Medicare Other

## 2011-09-29 DIAGNOSIS — J45909 Unspecified asthma, uncomplicated: Secondary | ICD-10-CM

## 2011-09-29 MED ORDER — OMALIZUMAB 150 MG ~~LOC~~ SOLR
375.0000 mg | Freq: Once | SUBCUTANEOUS | Status: AC
  Administered 2011-09-29: 375 mg via SUBCUTANEOUS

## 2011-09-29 MED ORDER — MOMETASONE FURO-FORMOTEROL FUM 100-5 MCG/ACT IN AERO: 2.0000 | INHALATION_SPRAY | Freq: Two times a day (BID) | RESPIRATORY_TRACT | Status: DC

## 2011-09-29 NOTE — Telephone Encounter (Signed)
Last ov 06/19/11 pt was switched from advair to dulera 100. She was supposed to call w/ update. LMTCB

## 2011-09-29 NOTE — Telephone Encounter (Signed)
Lets switch to dulera 100/5  2 bid, rinse mouth. She HAS to stay on something else if not advair.

## 2011-09-29 NOTE — Telephone Encounter (Signed)
Pt wants to stop Advair. She c/o hoarseness and throat irritation. She stated that she did use the Dulera samples and did fine but once those were gone she just went back on the Advair.  Pt states she will switch to the [facility] if Dr. Shelle Iron wants her to. Pls advise. Allergies  Allergen Reactions  . Iohexol      Desc: NOTES FROM PRIOR CT STATES PRE MEDICATION PRIOR TO OMNIPAQUE ENHANCED CT   . Keflex   . Penicillins   . Sulfonamide Derivatives   . Tetracycline   . Vancomycin

## 2011-09-29 NOTE — Telephone Encounter (Signed)
I spoke with pt and she states she was fine with changing to dulera and is aware to stop advair. She voiced her understanding.

## 2011-10-13 ENCOUNTER — Ambulatory Visit (INDEPENDENT_AMBULATORY_CARE_PROVIDER_SITE_OTHER): Payer: Medicare Other

## 2011-10-13 DIAGNOSIS — J45909 Unspecified asthma, uncomplicated: Secondary | ICD-10-CM

## 2011-10-14 DIAGNOSIS — J45909 Unspecified asthma, uncomplicated: Secondary | ICD-10-CM

## 2011-10-14 MED ORDER — OMALIZUMAB 150 MG ~~LOC~~ SOLR
375.0000 mg | Freq: Once | SUBCUTANEOUS | Status: AC
  Administered 2011-10-14: 375 mg via SUBCUTANEOUS

## 2011-10-21 ENCOUNTER — Ambulatory Visit (HOSPITAL_COMMUNITY): Payer: Medicare Other

## 2011-10-27 ENCOUNTER — Ambulatory Visit: Payer: Medicare Other

## 2011-11-09 ENCOUNTER — Ambulatory Visit (INDEPENDENT_AMBULATORY_CARE_PROVIDER_SITE_OTHER): Payer: Medicare Other | Admitting: Pulmonary Disease

## 2011-11-09 ENCOUNTER — Encounter: Payer: Self-pay | Admitting: Pulmonary Disease

## 2011-11-09 ENCOUNTER — Ambulatory Visit (INDEPENDENT_AMBULATORY_CARE_PROVIDER_SITE_OTHER): Payer: Medicare Other

## 2011-11-09 VITALS — BP 142/66 | HR 75 | Temp 97.6°F | Ht 66.5 in | Wt 143.6 lb

## 2011-11-09 DIAGNOSIS — J45909 Unspecified asthma, uncomplicated: Secondary | ICD-10-CM

## 2011-11-09 MED ORDER — OMALIZUMAB 150 MG ~~LOC~~ SOLR
375.0000 mg | Freq: Once | SUBCUTANEOUS | Status: AC
  Administered 2011-11-09: 375 mg via SUBCUTANEOUS

## 2011-11-09 NOTE — Patient Instructions (Signed)
Stay on dulera until it runs out, but will switch you to nebulizer treatments with the inhaled steroids.  Let me know when it is time. Stay on xolair followup with me in 6mos, or sooner if having issues.

## 2011-11-09 NOTE — Progress Notes (Signed)
  Subjective:    Patient ID: [name], female    DOB: Nov 07, 1923, 78 y.o.   MRN: [id number]  HPI The patient is in today for followup of her severe asthma.  She is maintained on her bronchodilator regimen as well as Xolair, and has been doing well on this.  Unfortunately, she continues to have issues with dysphonia and thrush, and I think we're going to have to change her over to nebulized inhaled corticosteroids for poor tolerance.  She has not had a recent acute exacerbation, and feels that her breathing is at her usual baseline.   Review of Systems  Constitutional: Negative for fever and unexpected weight change.  HENT: Positive for congestion and dental problem. Negative for ear pain, nosebleeds, sore throat, rhinorrhea, sneezing, trouble swallowing, postnasal drip and sinus pressure.   Eyes: Negative for redness and itching.  Respiratory: Positive for cough and wheezing. Negative for chest tightness and shortness of breath.   Cardiovascular: Positive for leg swelling. Negative for palpitations.  Gastrointestinal: Negative for nausea and vomiting.  Genitourinary: Negative for dysuria.  Musculoskeletal: Negative for joint swelling.  Skin: Negative for rash.  Neurological: Negative for headaches.  Hematological: Bruises/bleeds easily.  Psychiatric/Behavioral: Negative for dysphoric mood. The patient is not nervous/anxious.        Objective:   Physical Exam Thin female in no acute distress Nose without purulence or discharge noted Chest with fairly clear breath sounds, no wheezes or rhonchi Cardiac exam with regular rate and rhythm Lower extremities with 1+ edema, no cyanosis Alert and oriented, moves all 4 extremities.       Assessment & Plan:

## 2011-11-09 NOTE — Assessment & Plan Note (Signed)
The patient is doing very well on her current regimen, but continues to have issues with dysphonia even on the dulera.  I will change her over to nebulized Brovana and budesonide once her current inhaler runs out.  Hopefully this will eliminate the dysphonia and thrush issues.  Otherwise, she is to continue on her Xolair injections, which seem to be helping her quite a bit.

## 2011-11-23 ENCOUNTER — Ambulatory Visit: Payer: Medicare Other

## 2011-11-24 ENCOUNTER — Ambulatory Visit (INDEPENDENT_AMBULATORY_CARE_PROVIDER_SITE_OTHER): Payer: Medicare Other

## 2011-11-24 ENCOUNTER — Telehealth: Payer: Self-pay

## 2011-11-24 DIAGNOSIS — J45909 Unspecified asthma, uncomplicated: Secondary | ICD-10-CM

## 2011-11-24 NOTE — Telephone Encounter (Signed)
error 

## 2011-11-25 MED ORDER — OMALIZUMAB 150 MG ~~LOC~~ SOLR
375.0000 mg | Freq: Once | SUBCUTANEOUS | Status: AC
  Administered 2011-11-25: 375 mg via SUBCUTANEOUS

## 2011-12-08 ENCOUNTER — Ambulatory Visit (INDEPENDENT_AMBULATORY_CARE_PROVIDER_SITE_OTHER): Payer: Medicare Other

## 2011-12-08 DIAGNOSIS — J45909 Unspecified asthma, uncomplicated: Secondary | ICD-10-CM

## 2011-12-09 MED ORDER — OMALIZUMAB 150 MG ~~LOC~~ SOLR
375.0000 mg | Freq: Once | SUBCUTANEOUS | Status: AC
  Administered 2011-12-09: 375 mg via SUBCUTANEOUS

## 2011-12-22 ENCOUNTER — Ambulatory Visit (INDEPENDENT_AMBULATORY_CARE_PROVIDER_SITE_OTHER): Payer: Medicare Other

## 2011-12-22 DIAGNOSIS — J45909 Unspecified asthma, uncomplicated: Secondary | ICD-10-CM

## 2011-12-22 MED ORDER — OMALIZUMAB 150 MG ~~LOC~~ SOLR
375.0000 mg | Freq: Once | SUBCUTANEOUS | Status: AC
  Administered 2011-12-22: 375 mg via SUBCUTANEOUS

## 2012-01-05 ENCOUNTER — Ambulatory Visit (INDEPENDENT_AMBULATORY_CARE_PROVIDER_SITE_OTHER): Payer: Medicare Other

## 2012-01-05 DIAGNOSIS — J45909 Unspecified asthma, uncomplicated: Secondary | ICD-10-CM

## 2012-01-05 MED ORDER — OMALIZUMAB 150 MG ~~LOC~~ SOLR
375.0000 mg | Freq: Once | SUBCUTANEOUS | Status: AC
  Administered 2012-01-05: 375 mg via SUBCUTANEOUS

## 2012-01-11 ENCOUNTER — Telehealth: Payer: Self-pay | Admitting: Pulmonary Disease

## 2012-01-11 MED ORDER — IPRATROPIUM-ALBUTEROL 20-100 MCG/ACT IN AERS: 1.0000 | INHALATION_SPRAY | RESPIRATORY_TRACT | Status: DC | PRN

## 2012-01-11 NOTE — Telephone Encounter (Signed)
I spoke with pt and is aware rx has been sent. She stated she has already been shown how to use this and plus her pharmacists always shows her how to use her inhalers.

## 2012-01-11 NOTE — Telephone Encounter (Signed)
I spoke with pt and she states she has a coupon for the combivent respimat since the combivent is no longer being available. Please advise KC directions for pt and refills. thanks

## 2012-01-11 NOTE — Telephone Encounter (Signed)
The respimat is one inhalation every 4 hrs if needed for rescue.  Someone will have to show her how to use the new device.

## 2012-01-19 ENCOUNTER — Ambulatory Visit (INDEPENDENT_AMBULATORY_CARE_PROVIDER_SITE_OTHER): Payer: Medicare Other

## 2012-01-19 DIAGNOSIS — J45909 Unspecified asthma, uncomplicated: Secondary | ICD-10-CM

## 2012-01-20 DIAGNOSIS — J45909 Unspecified asthma, uncomplicated: Secondary | ICD-10-CM

## 2012-01-20 MED ORDER — OMALIZUMAB 150 MG ~~LOC~~ SOLR
375.0000 mg | Freq: Once | SUBCUTANEOUS | Status: AC
  Administered 2012-01-20: 375 mg via SUBCUTANEOUS

## 2012-01-26 ENCOUNTER — Other Ambulatory Visit: Payer: Self-pay | Admitting: Pulmonary Disease

## 2012-02-02 ENCOUNTER — Ambulatory Visit: Payer: Medicare Other

## 2012-02-04 ENCOUNTER — Ambulatory Visit (INDEPENDENT_AMBULATORY_CARE_PROVIDER_SITE_OTHER): Payer: Medicare Other

## 2012-02-04 DIAGNOSIS — J45909 Unspecified asthma, uncomplicated: Secondary | ICD-10-CM

## 2012-02-04 MED ORDER — OMALIZUMAB 150 MG ~~LOC~~ SOLR
375.0000 mg | Freq: Once | SUBCUTANEOUS | Status: AC
  Administered 2012-02-04: 375 mg via SUBCUTANEOUS

## 2012-02-17 ENCOUNTER — Ambulatory Visit (INDEPENDENT_AMBULATORY_CARE_PROVIDER_SITE_OTHER): Payer: Medicare Other

## 2012-02-17 DIAGNOSIS — J45909 Unspecified asthma, uncomplicated: Secondary | ICD-10-CM

## 2012-02-17 MED ORDER — OMALIZUMAB 150 MG ~~LOC~~ SOLR
375.0000 mg | Freq: Once | SUBCUTANEOUS | Status: AC
  Administered 2012-02-17: 375 mg via SUBCUTANEOUS

## 2012-02-24 ENCOUNTER — Emergency Department (HOSPITAL_COMMUNITY): Payer: Medicare Other

## 2012-02-24 ENCOUNTER — Emergency Department (HOSPITAL_COMMUNITY)
Admission: EM | Admit: 2012-02-24 | Discharge: 2012-02-24 | Disposition: A | Discharge status: Home / Self Care | Payer: Medicare Other | Attending: Emergency Medicine | Admitting: Emergency Medicine

## 2012-02-24 ENCOUNTER — Encounter (HOSPITAL_COMMUNITY): Payer: Self-pay | Admitting: *Deleted

## 2012-02-24 DIAGNOSIS — X58XXXA Exposure to other specified factors, initial encounter: Secondary | ICD-10-CM | POA: Insufficient documentation

## 2012-02-24 DIAGNOSIS — S8011XA Contusion of right lower leg, initial encounter: Secondary | ICD-10-CM

## 2012-02-24 DIAGNOSIS — S8010XA Contusion of unspecified lower leg, initial encounter: Secondary | ICD-10-CM | POA: Insufficient documentation

## 2012-02-24 DIAGNOSIS — Z882 Allergy status to sulfonamides status: Secondary | ICD-10-CM | POA: Insufficient documentation

## 2012-02-24 DIAGNOSIS — J45909 Unspecified asthma, uncomplicated: Secondary | ICD-10-CM | POA: Insufficient documentation

## 2012-02-24 DIAGNOSIS — Z87891 Personal history of nicotine dependence: Secondary | ICD-10-CM | POA: Insufficient documentation

## 2012-02-24 DIAGNOSIS — I4891 Unspecified atrial fibrillation: Secondary | ICD-10-CM | POA: Insufficient documentation

## 2012-02-24 DIAGNOSIS — I2510 Atherosclerotic heart disease of native coronary artery without angina pectoris: Secondary | ICD-10-CM | POA: Insufficient documentation

## 2012-02-24 DIAGNOSIS — Z7901 Long term (current) use of anticoagulants: Secondary | ICD-10-CM | POA: Insufficient documentation

## 2012-02-24 DIAGNOSIS — I10 Essential (primary) hypertension: Secondary | ICD-10-CM | POA: Insufficient documentation

## 2012-02-24 DIAGNOSIS — Z880 Allergy status to penicillin: Secondary | ICD-10-CM | POA: Insufficient documentation

## 2012-02-24 DIAGNOSIS — K219 Gastro-esophageal reflux disease without esophagitis: Secondary | ICD-10-CM | POA: Insufficient documentation

## 2012-02-24 DIAGNOSIS — Z9861 Coronary angioplasty status: Secondary | ICD-10-CM | POA: Insufficient documentation

## 2012-02-24 NOTE — ED Notes (Signed)
Pt is on coumadin and hit right leg yesterday on bed railing.  PT has bruising.

## 2012-02-24 NOTE — ED Provider Notes (Signed)
History   This chart was scribed for Doug Sou, MD by Charolett Bumpers . The patient was seen in room TR11C/TR11C. Patient's care was started at 1606.    CSN: [id number]  Arrival date & time 02/24/12  1410   First MD Initiated Contact with Patient 02/24/12 1606      Chief Complaint  Patient presents with  . Leg Swelling    (Consider location/radiation/quality/duration/timing/severity/associated sxs/prior treatment) HPI [name] is a 78 y.o. female who presents to the Emergency Department complaining of constant, improving right leg swelling with an onset of yesterday after hitting her leg on bed railing. Pt reports associated bruising. Pt reports some soreness in the back of her calf, but no pain to the injured area. Pt is able to ambulate normally. Pt is taking Coumadin for A-fib. Pt states that her INR is suppose to be checked tomorrow. Pt reports a h/o peripheral neuropathy. Pt denies alcohol or tobacco use.    Past Medical History  Diagnosis Date  . Asthma   . Esophageal reflux   . CAD (coronary artery disease)   . Unspecified essential hypertension   . Glaucoma   . Atrial fibrillation   . Diverticulosis     Past Surgical History  Procedure Date  . Appendectomy   . Vesicovaginal fistula closure w/ tah   . Nasal sinus surgery   . Cholecystectomy   . Coronary angioplasty with stent placement     Family History  Problem Relation Age of Onset  . Heart disease Father   . Colon cancer Mother     History  Substance Use Topics  . Smoking status: Former Smoker -- 0.2 packs/day for 25 years    Types: Cigarettes    Quit date: 07/06/1966  . Smokeless tobacco: Not on file  . Alcohol Use: No    OB History    Grav Para Term Preterm Abortions TAB SAB Ect Mult Living                  Review of Systems  Constitutional: Negative.   HENT: Negative.   Respiratory: Negative.   Cardiovascular: Positive for leg swelling.  Gastrointestinal:  Negative.   Musculoskeletal: Negative.   Skin: Negative.        Ecchymosis to right shin.   Neurological: Positive for numbness.  Hematological: Bruises/bleeds easily.  Psychiatric/Behavioral: Negative.   All other systems reviewed and are negative.    Allergies  Cephalexin; Iohexol; Penicillins; Sulfonamide derivatives; Tetracycline; and Vancomycin  Home Medications   Current Outpatient Rx  Name Route Sig Dispense Refill  . ALPRAZOLAM 0.25 MG PO TABS Oral Take 0.25 mg by mouth 3 (three) times daily as needed. For anxiety    . AMLODIPINE BESYLATE-VALSARTAN 10-320 MG PO TABS Oral Take 1 tablet by mouth daily.      . ASPIRIN EC 81 MG PO TBEC Oral Take 81 mg by mouth every other day.    [name] DIGOXIN 0.125 MG PO TABS Oral Take 125 mcg by mouth daily.      . FUROSEMIDE 40 MG PO TABS Oral Take 40 mg by mouth daily as needed. For fluid retention    . IPRATROPIUM-ALBUTEROL 20-100 MCG/ACT IN AERS Inhalation Inhale 1 puff into the lungs every 4 (four) hours as needed. For rescue 1 Inhaler 5  . LATANOPROST 0.005 % OP SOLN Both Eyes Place 1 drop into both eyes at bedtime.      . MOMETASONE FUROATE 50 MCG/ACT NA SUSP Nasal Place 2 sprays into  the nose daily.    . MOMETASONE FURO-FORMOTEROL FUM 100-5 MCG/ACT IN AERO Inhalation Inhale 2 puffs into the lungs 2 (two) times daily. 1 Inhaler 5  . OMALIZUMAB 150 MG [place] SOLR Subcutaneous Inject into the skin every 14 (fourteen) days.     [name] SIMVASTATIN 5 MG PO TABS Oral Take 1 tablet by mouth at bedtime.     . WARFARIN SODIUM 5 MG PO TABS Oral Take 3.75 mg by mouth every morning.      BP 133/55  Pulse 79  Temp 97.9 F (36.6 C) (Oral)  Resp 18  SpO2 98%  Physical Exam  Nursing note and vitals reviewed. Constitutional: She appears well-developed and well-nourished.  HENT:  Head: Normocephalic and atraumatic.  Eyes: Conjunctivae are normal. Pupils are equal, round, and reactive to light.  Neck: Neck supple. No tracheal deviation present. No  thyromegaly present.  Cardiovascular: Normal rate, regular rhythm and normal heart sounds.   No murmur heard. Pulmonary/Chest: Effort normal and breath sounds normal.  Abdominal: Soft. Bowel sounds are normal. She exhibits no distension. There is no tenderness.  Musculoskeletal: Normal range of motion. She exhibits edema. She exhibits no tenderness.       Bilateral lower extremities edematous. Right lower extremity with purplish ecchymosis along the shin, no tenderness DP pulse 2+  Neurological: She is alert. Coordination normal.  Skin: Skin is warm and dry. No rash noted.       Moderate ecchymosis to the anterior right shin.   Psychiatric: She has a normal mood and affect.    ED Course  Procedures (including critical care time)  DIAGNOSTIC STUDIES: Oxygen Saturation is 98% on room air, normal by my interpretation.    COORDINATION OF CARE:  16:13-Discussed planned course of treatment with the patient, who is agreeable at this time.     Labs Reviewed  PROTIME-INR - Abnormal; Notable for the following:    Prothrombin Time 31.0 (*)     INR 2.93 (*)     All other components within normal limits   Dg Tibia/fibula Right  02/24/2012  *RADIOLOGY REPORT*  Clinical Data: Trauma.  Pain.  Bruising.  On blood thinner.  RIGHT TIBIA AND FIBULA - 2 VIEW  Comparison: 02/16/2008 right knee films.  Findings: Soft tissue swelling lateral malleolar region.  Degenerative changes surrounding the most notable medial tibiofemoral joint space.  No fracture or dislocation.  IMPRESSION: Soft tissue swelling lateral malleolar region.  No fracture or dislocation.   Original Report Authenticated By: Fuller Canada, M.D.    Results for orders placed during the hospital encounter of 02/24/12  PROTIME-INR      Component Value Range   Prothrombin Time 31.0 (*) 11.6 - 15.2 seconds   INR 2.93 (*) 0.00 - 1.49   Dg Tibia/fibula Right  02/24/2012  *RADIOLOGY REPORT*  Clinical Data: Trauma.  Pain.  Bruising.  On  blood thinner.  RIGHT TIBIA AND FIBULA - 2 VIEW  Comparison: 02/16/2008 right knee films.  Findings: Soft tissue swelling lateral malleolar region.  Degenerative changes surrounding the most notable medial tibiofemoral joint space.  No fracture or dislocation.  IMPRESSION: Soft tissue swelling lateral malleolar region.  No fracture or dislocation.   Original Report Authenticated By: Fuller Canada, M.D.      No diagnosis found.  xrayReviewed by me  MDM  In light of neuropathy x-ray ordered to rule out fracture this patient has diminished sensation in her extremities  Patient's INR is therapeutic No further treatment needed Diagnosis  contusion right lower extremity  I personally performed the services described in this documentation, which was scribed in my presence. The recorded information has been reviewed and considered.     Doug Sou, MD 02/24/12 1747

## 2012-03-03 ENCOUNTER — Ambulatory Visit (INDEPENDENT_AMBULATORY_CARE_PROVIDER_SITE_OTHER): Payer: Medicare Other

## 2012-03-03 DIAGNOSIS — J45909 Unspecified asthma, uncomplicated: Secondary | ICD-10-CM

## 2012-03-03 MED ORDER — OMALIZUMAB 150 MG ~~LOC~~ SOLR
375.0000 mg | Freq: Once | SUBCUTANEOUS | Status: AC
  Administered 2012-03-03: 375 mg via SUBCUTANEOUS

## 2012-03-17 ENCOUNTER — Ambulatory Visit (INDEPENDENT_AMBULATORY_CARE_PROVIDER_SITE_OTHER): Payer: Medicare Other

## 2012-03-17 DIAGNOSIS — J45909 Unspecified asthma, uncomplicated: Secondary | ICD-10-CM

## 2012-03-17 MED ORDER — OMALIZUMAB 150 MG ~~LOC~~ SOLR
375.0000 mg | Freq: Once | SUBCUTANEOUS | Status: AC
  Administered 2012-03-17: 375 mg via SUBCUTANEOUS

## 2012-03-18 ENCOUNTER — Telehealth: Payer: Self-pay | Admitting: Pulmonary Disease

## 2012-03-18 NOTE — Telephone Encounter (Signed)
Would start her on : brovana by neb q12h Budesonide 0.5mg  by neb  Q12h. Albuterol 2.5mg  by neb q6h prn rescue. She can use her combivent for rescue when away from home.  Also need to make sure her neb machine is in working order.

## 2012-03-18 NOTE — Telephone Encounter (Signed)
Spoke with pt. She states only has a few days left on [facility] and wants to go ahead and get [facility] to order nebulized meds for her as discussed at May ov. She states already has a machine, just needs meds. KC, please advise thanks!

## 2012-03-21 MED ORDER — ARFORMOTEROL TARTRATE 15 MCG/2ML IN NEBU: 15.0000 ug | INHALATION_SOLUTION | Freq: Two times a day (BID) | RESPIRATORY_TRACT | Status: DC

## 2012-03-21 MED ORDER — ALBUTEROL SULFATE (2.5 MG/3ML) 0.083% IN NEBU: 2.5000 mg | INHALATION_SOLUTION | Freq: Four times a day (QID) | RESPIRATORY_TRACT | Status: DC | PRN

## 2012-03-21 MED ORDER — BUDESONIDE 0.5 MG/2ML IN SUSP: 0.5000 mg | Freq: Two times a day (BID) | RESPIRATORY_TRACT | Status: DC

## 2012-03-21 NOTE — Telephone Encounter (Signed)
Pt made aware pf instructions for nebulizer use and stated that the nebulizer machine works well. Prescriptions have been sent to [facility].

## 2012-03-23 ENCOUNTER — Telehealth: Payer: Self-pay | Admitting: Pulmonary Disease

## 2012-03-23 NOTE — Telephone Encounter (Signed)
PT started on new neb meds on 03-21-12 -  Budesonide and Brovana every 12 hrs and Albuterol 2.5 prn.  Pt reports that she hasn't had to use Albuterol yet.  But since starting the others she has had episodes of increased heart rate.  Last night it was so bad she couldn't sleep.  Pt has h/o Atrial Fib.  Please advise.

## 2012-03-24 NOTE — Telephone Encounter (Signed)
KC, please advise, the only way we will know how much it will const at local pharm is if we call in rx. Please advise directions on med if you still want her to have this, thanks

## 2012-03-24 NOTE — Telephone Encounter (Signed)
I spoke with pt and notified of recs per [facility], she verbalized understanding. She states that she has stopped nebs and has started back on dulera and has not had any s/e from this. She states that she thinks this helps her breathing more, and although it had caused hoarseness in the past she is willing to take med anyway. Wants to know if [facility] thinks this is okay. Please advise, thanks!

## 2012-03-24 NOTE — Telephone Encounter (Signed)
xopenex 0.63 per nebulizer qid and prn #150, 6 fills.    We also need to see what part insurance covers, can we get from dme, etc.

## 2012-03-24 NOTE — Telephone Encounter (Signed)
Per earlier message [facility] would PCC's to check into this/ Please advise PCC's thanks

## 2012-03-24 NOTE — Telephone Encounter (Signed)
This can happen on rare occasions with brovana.  We can consider changing to xopenex nebs, but we will have to investigate the cost to the pt, and whether her insurance will cover.   Please refer to pcc to check into this for the pt.  In the meantime, if she wants to continue trying the medication, have her take her second treatment no later then 3-4 pm.   If it is bothering her too much, can go back to her inhaled medications until we can sort this out.

## 2012-03-24 NOTE — Telephone Encounter (Signed)
LMTCB for pt to let her know before calling in rx

## 2012-03-24 NOTE — Telephone Encounter (Signed)
The only thing we know about xopenex is dme does not provide it anymore because of the cost so pt has to get ir at a local pharmacy i don't know anything about the price sorry Tobe Sos

## 2012-03-24 NOTE — Telephone Encounter (Signed)
I still think we should at least consider xopenex.  Please look into insurance coverage and cost to pt.

## 2012-03-25 NOTE — Telephone Encounter (Signed)
ATC pt at number above -- line busy x 3. WCB

## 2012-03-28 NOTE — Telephone Encounter (Signed)
KC, I called this pt to ask about what pharm to send xopenex to and she states does not want to try this, her asthma is doing fine with dulera alone, and if it causes hoarseness again "I will just deal with it". Please advise thanks!

## 2012-03-28 NOTE — Telephone Encounter (Signed)
The pt does not want the nebulizer medication. She has restarted the [facility] and says this is now working fine for her and she is not having any trouble. Will forward to [facility] as an FYI.

## 2012-03-31 ENCOUNTER — Ambulatory Visit (INDEPENDENT_AMBULATORY_CARE_PROVIDER_SITE_OTHER): Payer: Medicare Other

## 2012-03-31 DIAGNOSIS — J45909 Unspecified asthma, uncomplicated: Secondary | ICD-10-CM

## 2012-04-01 DIAGNOSIS — J45909 Unspecified asthma, uncomplicated: Secondary | ICD-10-CM

## 2012-04-01 MED ORDER — OMALIZUMAB 150 MG ~~LOC~~ SOLR
375.0000 mg | Freq: Once | SUBCUTANEOUS | Status: AC
  Administered 2012-04-01: 375 mg via SUBCUTANEOUS

## 2012-04-14 ENCOUNTER — Ambulatory Visit (INDEPENDENT_AMBULATORY_CARE_PROVIDER_SITE_OTHER): Payer: Medicare Other

## 2012-04-14 DIAGNOSIS — J45909 Unspecified asthma, uncomplicated: Secondary | ICD-10-CM

## 2012-04-15 MED ORDER — OMALIZUMAB 150 MG ~~LOC~~ SOLR
375.0000 mg | Freq: Once | SUBCUTANEOUS | Status: AC
  Administered 2012-04-15: 375 mg via SUBCUTANEOUS

## 2012-04-19 ENCOUNTER — Ambulatory Visit: Payer: Medicare Other

## 2012-04-28 ENCOUNTER — Ambulatory Visit (INDEPENDENT_AMBULATORY_CARE_PROVIDER_SITE_OTHER): Payer: Medicare Other

## 2012-04-28 DIAGNOSIS — J45909 Unspecified asthma, uncomplicated: Secondary | ICD-10-CM

## 2012-04-28 MED ORDER — OMALIZUMAB 150 MG ~~LOC~~ SOLR
375.0000 mg | Freq: Once | SUBCUTANEOUS | Status: AC
  Administered 2012-04-28: 375 mg via SUBCUTANEOUS

## 2012-05-09 ENCOUNTER — Telehealth: Payer: Self-pay | Admitting: Pulmonary Disease

## 2012-05-09 NOTE — Telephone Encounter (Signed)
[name] in allergy lab has already spoken with patient regarding this matter. Pt is aware that she will need to keep Xolair appt as is due to insurance not covering for injections if too early.

## 2012-05-09 NOTE — Telephone Encounter (Signed)
Per Allergy insurance will not cover the xolair if is a day early. She will have to get it 05/12/12 or day after.   ATC pt NA wcb

## 2012-05-11 ENCOUNTER — Ambulatory Visit (INDEPENDENT_AMBULATORY_CARE_PROVIDER_SITE_OTHER): Payer: Medicare Other | Admitting: Pulmonary Disease

## 2012-05-11 ENCOUNTER — Encounter: Payer: Self-pay | Admitting: Pulmonary Disease

## 2012-05-11 VITALS — BP 132/72 | HR 69 | Temp 97.5°F | Ht 67.0 in | Wt 140.2 lb

## 2012-05-11 DIAGNOSIS — J45909 Unspecified asthma, uncomplicated: Secondary | ICD-10-CM

## 2012-05-11 NOTE — Patient Instructions (Addendum)
No change in asthma medications. Use neilmed rinses am and pm for next week or two.  If your congestion is not improving by early next week, please call us for antibiotics. If you start producing nasty mucus from nose or chest, please call us followup with me in 6mos if doing well.

## 2012-05-11 NOTE — Assessment & Plan Note (Signed)
The patient is doing fairly well from an asthma standpoint on her current regimen, and I have asked her to continue her bronchodilators and Xolair.  She is having a lot of nasal and head congestion, but it is unclear whether this is sinusitis or rhinitis.  I would like for her to work on nasal hygiene with rinses, and she may need antibiotics if this does not improve.

## 2012-05-11 NOTE — Progress Notes (Signed)
  Subjective:    Patient ID: [name], female    DOB: 08/06/1923, 78 y.o.   MRN: [id number]  HPI The patient comes in today for followup of her known severe asthma with allergic component.  She was tried on nebulized bronchodilators to see if that would improve her symptoms, but she was unable to tolerate these.  She is now back on dulera, and feels that she is doing well with this.  She is willing to accept the intermittent dysphonia.  Her only complaint today is that of persistent sinus and head congestion, but she is not blowing purulent material from her nose.  She denies any chest congestion.  She feels her breathing is at her usual baseline.   Review of Systems  Constitutional: Negative for fever and unexpected weight change.  HENT: Positive for ear pain, congestion, rhinorrhea and postnasal drip. Negative for nosebleeds, sore throat, sneezing, trouble swallowing, dental problem and sinus pressure.   Eyes: Positive for redness and itching.  Respiratory: Positive for shortness of breath and wheezing. Negative for cough and chest tightness.   Cardiovascular: Negative for palpitations and leg swelling.  Gastrointestinal: Negative for nausea and vomiting.  Genitourinary: Negative for dysuria.  Musculoskeletal: Positive for joint swelling.  Skin: Negative for rash.  Neurological: Negative for headaches.  Hematological: Does not bruise/bleed easily.  Psychiatric/Behavioral: Negative for dysphoric mood. The patient is nervous/anxious.        Objective:   Physical Exam Thin female in no acute distress Nose without purulence or discharge noted Chest with decreased breath sounds, a few rhonchi, no wheezing Cardiac exam with regular rate and rhythm Lower extremities with mild edema, no cyanosis alert and oriented, moves all 4 extremities.       Assessment & Plan:

## 2012-05-12 ENCOUNTER — Ambulatory Visit: Payer: Medicare Other

## 2012-05-17 ENCOUNTER — Telehealth: Payer: Self-pay | Admitting: Pulmonary Disease

## 2012-05-17 ENCOUNTER — Ambulatory Visit (INDEPENDENT_AMBULATORY_CARE_PROVIDER_SITE_OTHER): Payer: Medicare Other

## 2012-05-17 DIAGNOSIS — J45909 Unspecified asthma, uncomplicated: Secondary | ICD-10-CM

## 2012-05-17 MED ORDER — CEFDINIR 300 MG PO CAPS: 300.0000 mg | ORAL_CAPSULE | Freq: Two times a day (BID) | ORAL | Status: DC

## 2012-05-17 NOTE — Telephone Encounter (Signed)
Called, spoke with pt.  Pt states she does not feel like she is getting bronchitis but thinks this is a sinus infection.  She would like abx called in and is aware on how to take this.  Asked she pls call back if she has any problems with abx or symptoms do not improve or worsen.  She verbalized understanding of instructions.  -----   When trying to send omnicef rx, received override msg for 2 reasons: 1.  Pt has an allergy to cephalexin - itching 2.  Pt has an allergy to PCN - swelling and itching.  This is a cross sensitive class. Dr. Shelle Iron, pls advise if ok to proceed with omnicef rx.  Thank you.

## 2012-05-17 NOTE — Telephone Encounter (Signed)
If the pt feels she is getting bronchitis or a sinusitis, can call in omnicef 300mg   2 each am for 7 days.

## 2012-05-17 NOTE — Telephone Encounter (Signed)
Spoke with patient, patient did request omnicef originally.  Patient states she has taken that omnicef previously but the last times she took this medication she was given a Depo shot FIRST and then took the medication.  Tried placing order for Levaquin 750mg --- VERY HIGH reaction for bleeding risk noted since patient is currently on coumadin.  Dr. Shelle Iron currently out of office--spoke with Dr. Kriste Basque and per Dr. Kriste Basque since patient has requested omnicef to send in Rx for omnicef.  Spoke with patient she has stated again she has taken omnicef and is aware of how it reacts w her body and she is ok with this med being sent in. Aware that omnicef 300mg  #20 0 refills 1 twice a x 10 days will be sent into [facility]. Nothing further needed.

## 2012-05-17 NOTE — Telephone Encounter (Signed)
Called, spoke with pt.  Pt states she is doing the sinus rinse bid and is not getting any dark mucus out.  Reports mucus is "plain and mostly water."  States cough is at baseline and is nonprod.  Reports nasal congestion that "goes from one nostril to the other" with occasionally a small amount of clear mucus.  States she does have sinus pressure and pressure in ears - worse in right ear.  Dr. Shelle Iron, pls advise.  Thank you.

## 2012-05-17 NOTE — Telephone Encounter (Signed)
If I remember correctly, she has taken omnicef??  Didn't she specifically ask for this? Can give her levaquin 750mg  one a day for 5 days.

## 2012-05-17 NOTE — Telephone Encounter (Signed)
  Verified msg with Selena Batten T., she spoke with the pt here in the office. Pt would like an rx for Cefdinir called to her pharmacy. KC, pls advise. Allergies  Allergen Reactions  . Cephalexin Itching  . Iohexol      Desc: NOTES FROM PRIOR CT STATES PRE MEDICATION PRIOR TO OMNIPAQUE ENHANCED CT   . Penicillins Itching and Swelling    Swelling to ears  . Sulfonamide Derivatives Itching  . Tetracycline Itching  . Vancomycin Other (See Comments)    Turn red

## 2012-05-17 NOTE — Telephone Encounter (Signed)
Is she coughing up nasty stuff, or is she blowing nasty from nose with sinus pressure?

## 2012-05-18 MED ORDER — OMALIZUMAB 150 MG ~~LOC~~ SOLR
375.0000 mg | Freq: Once | SUBCUTANEOUS | Status: AC
  Administered 2012-05-18: 375 mg via SUBCUTANEOUS

## 2012-05-31 ENCOUNTER — Ambulatory Visit (INDEPENDENT_AMBULATORY_CARE_PROVIDER_SITE_OTHER): Payer: Medicare Other

## 2012-05-31 DIAGNOSIS — J45909 Unspecified asthma, uncomplicated: Secondary | ICD-10-CM

## 2012-05-31 MED ORDER — OMALIZUMAB 150 MG ~~LOC~~ SOLR
375.0000 mg | Freq: Once | SUBCUTANEOUS | Status: AC
  Administered 2012-05-31: 375 mg via SUBCUTANEOUS

## 2012-06-14 ENCOUNTER — Ambulatory Visit (INDEPENDENT_AMBULATORY_CARE_PROVIDER_SITE_OTHER): Payer: Medicare Other

## 2012-06-14 DIAGNOSIS — J45909 Unspecified asthma, uncomplicated: Secondary | ICD-10-CM

## 2012-06-16 DIAGNOSIS — J45909 Unspecified asthma, uncomplicated: Secondary | ICD-10-CM

## 2012-06-16 MED ORDER — OMALIZUMAB 150 MG ~~LOC~~ SOLR
375.0000 mg | Freq: Once | SUBCUTANEOUS | Status: AC
  Administered 2012-06-16: 375 mg via SUBCUTANEOUS

## 2012-06-28 ENCOUNTER — Ambulatory Visit (INDEPENDENT_AMBULATORY_CARE_PROVIDER_SITE_OTHER): Payer: Medicare Other

## 2012-06-28 DIAGNOSIS — J45909 Unspecified asthma, uncomplicated: Secondary | ICD-10-CM

## 2012-07-01 ENCOUNTER — Telehealth: Payer: Self-pay | Admitting: Pulmonary Disease

## 2012-07-01 MED ORDER — FLUTICASONE-SALMETEROL 500-50 MCG/DOSE IN AEPB: 1.0000 | INHALATION_SPRAY | Freq: Two times a day (BID) | RESPIRATORY_TRACT | Status: DC

## 2012-07-01 MED ORDER — OMALIZUMAB 150 MG ~~LOC~~ SOLR
375.0000 mg | Freq: Once | SUBCUTANEOUS | Status: AC
  Administered 2012-07-01: 375 mg via SUBCUTANEOUS

## 2012-07-01 NOTE — Telephone Encounter (Signed)
Spoke with pt states that dulera is causing her to have thick mucus and wants to go back on advair 500/50 States never had a problem with that. Dr Shelle Iron is out of the office  Dr young Please advise  Thank you

## 2012-07-01 NOTE — Telephone Encounter (Signed)
Per CY D/c dulera restart advair 500/50 1 puff bid rinse mouth after use. Refill prn  Pt aware rx sent nothing further needed.

## 2012-07-13 ENCOUNTER — Ambulatory Visit (INDEPENDENT_AMBULATORY_CARE_PROVIDER_SITE_OTHER): Payer: Medicare Other

## 2012-07-13 DIAGNOSIS — J45909 Unspecified asthma, uncomplicated: Secondary | ICD-10-CM

## 2012-07-13 MED ORDER — OMALIZUMAB 150 MG ~~LOC~~ SOLR
375.0000 mg | Freq: Once | SUBCUTANEOUS | Status: AC
  Administered 2012-07-13: 375 mg via SUBCUTANEOUS

## 2012-07-27 ENCOUNTER — Ambulatory Visit (INDEPENDENT_AMBULATORY_CARE_PROVIDER_SITE_OTHER): Payer: Medicare Other

## 2012-07-27 DIAGNOSIS — J45909 Unspecified asthma, uncomplicated: Secondary | ICD-10-CM

## 2012-07-27 MED ORDER — OMALIZUMAB 150 MG ~~LOC~~ SOLR
375.0000 mg | Freq: Once | SUBCUTANEOUS | Status: AC
  Administered 2012-07-27: 375 mg via SUBCUTANEOUS

## 2012-08-10 ENCOUNTER — Ambulatory Visit (INDEPENDENT_AMBULATORY_CARE_PROVIDER_SITE_OTHER): Payer: Medicare Other

## 2012-08-10 DIAGNOSIS — J45909 Unspecified asthma, uncomplicated: Secondary | ICD-10-CM

## 2012-08-12 MED ORDER — OMALIZUMAB 150 MG ~~LOC~~ SOLR
375.0000 mg | Freq: Once | SUBCUTANEOUS | Status: AC
  Administered 2012-08-12: 375 mg via SUBCUTANEOUS

## 2012-08-24 ENCOUNTER — Ambulatory Visit (INDEPENDENT_AMBULATORY_CARE_PROVIDER_SITE_OTHER): Payer: Medicare Other

## 2012-08-24 DIAGNOSIS — J45909 Unspecified asthma, uncomplicated: Secondary | ICD-10-CM

## 2012-08-25 MED ORDER — OMALIZUMAB 150 MG ~~LOC~~ SOLR
375.0000 mg | Freq: Once | SUBCUTANEOUS | Status: AC
  Administered 2012-08-25: 375 mg via SUBCUTANEOUS

## 2012-09-07 ENCOUNTER — Ambulatory Visit: Payer: Medicare Other

## 2012-09-08 ENCOUNTER — Telehealth: Payer: Self-pay | Admitting: Pulmonary Disease

## 2012-09-08 NOTE — Telephone Encounter (Signed)
I spoke with Chantel; states Sharone had spoken with patient a few days ago about this matter. Sharone stated she explained to the patient that she lost her grant due to not getting her medications (inhalers) filled as stated in the guidelines for the grant program through [employer]. Pt will have to pay out of pocket until her deductible has been met; patient can speak with billing to make arrangements to assist with making smaller payments so she may continue her Xolair injections.    Pt will have to pay out of pocket for her medicine however. I called and explained this to [name] (granddaughter) who understood clearly and will explain to the patient again. I gave her the Billing office number of [phone] to call and see what can be done about making smaller payments. Nothing further needed at this time.

## 2012-09-08 NOTE — Telephone Encounter (Signed)
Spoke with [name], pt granddaughter. States she no longer has grant and will no longer have xolair covered (the medication part). [name] states she received phone call from company stating that they would offer to help her find another grant if patient gave company $400.  Company: [employer]---> www.[employer].com  Is there anywhere else that can help patient afford xolair?  I have advised [name] that I will send this message to Chantel to help find patient some asst.

## 2012-09-24 ENCOUNTER — Inpatient Hospital Stay (HOSPITAL_COMMUNITY)
Admission: EM | Admit: 2012-09-24 | Discharge: 2012-09-24 | DRG: 310 | Disposition: A | Discharge status: Home / Self Care | Payer: Medicare Other | Attending: Internal Medicine | Admitting: Internal Medicine

## 2012-09-24 ENCOUNTER — Emergency Department (HOSPITAL_COMMUNITY): Payer: Medicare Other

## 2012-09-24 ENCOUNTER — Encounter (HOSPITAL_COMMUNITY): Payer: Self-pay | Admitting: Adult Health

## 2012-09-24 DIAGNOSIS — I10 Essential (primary) hypertension: Secondary | ICD-10-CM | POA: Diagnosis present

## 2012-09-24 DIAGNOSIS — Z9861 Coronary angioplasty status: Secondary | ICD-10-CM

## 2012-09-24 DIAGNOSIS — F411 Generalized anxiety disorder: Secondary | ICD-10-CM | POA: Diagnosis present

## 2012-09-24 DIAGNOSIS — E785 Hyperlipidemia, unspecified: Secondary | ICD-10-CM | POA: Diagnosis present

## 2012-09-24 DIAGNOSIS — K219 Gastro-esophageal reflux disease without esophagitis: Secondary | ICD-10-CM | POA: Diagnosis present

## 2012-09-24 DIAGNOSIS — E782 Mixed hyperlipidemia: Secondary | ICD-10-CM

## 2012-09-24 DIAGNOSIS — H409 Unspecified glaucoma: Secondary | ICD-10-CM | POA: Diagnosis present

## 2012-09-24 DIAGNOSIS — I498 Other specified cardiac arrhythmias: Secondary | ICD-10-CM | POA: Diagnosis not present

## 2012-09-24 DIAGNOSIS — Z7901 Long term (current) use of anticoagulants: Secondary | ICD-10-CM

## 2012-09-24 DIAGNOSIS — J4489 Other specified chronic obstructive pulmonary disease: Secondary | ICD-10-CM | POA: Diagnosis present

## 2012-09-24 DIAGNOSIS — J45909 Unspecified asthma, uncomplicated: Secondary | ICD-10-CM | POA: Diagnosis present

## 2012-09-24 DIAGNOSIS — I4891 Unspecified atrial fibrillation: Secondary | ICD-10-CM | POA: Diagnosis present

## 2012-09-24 DIAGNOSIS — J449 Chronic obstructive pulmonary disease, unspecified: Secondary | ICD-10-CM | POA: Diagnosis present

## 2012-09-24 DIAGNOSIS — Z87891 Personal history of nicotine dependence: Secondary | ICD-10-CM

## 2012-09-24 DIAGNOSIS — I2510 Atherosclerotic heart disease of native coronary artery without angina pectoris: Secondary | ICD-10-CM | POA: Diagnosis present

## 2012-09-24 DIAGNOSIS — Z7982 Long term (current) use of aspirin: Secondary | ICD-10-CM

## 2012-09-24 LAB — BASIC METABOLIC PANEL
BUN: 21 mg/dL (ref 6–23)
CO2: 26 mEq/L (ref 19–32)
Chloride: 101 mEq/L (ref 96–112)
Glucose, Bld: 119 mg/dL — ABNORMAL HIGH (ref 70–99)
Potassium: 4.8 mEq/L (ref 3.5–5.1)
Sodium: 137 mEq/L (ref 135–145)

## 2012-09-24 LAB — CBC
HCT: 38 % (ref 36.0–46.0)
Hemoglobin: 13.2 g/dL (ref 12.0–15.0)
RBC: 4.34 MIL/uL (ref 3.87–5.11)

## 2012-09-24 LAB — POCT I-STAT TROPONIN I: Troponin i, poc: 0 ng/mL (ref 0.00–0.08)

## 2012-09-24 LAB — TSH: TSH: 3.051 u[IU]/mL (ref 0.350–4.500)

## 2012-09-24 LAB — PROTIME-INR: INR: 2.88 — ABNORMAL HIGH (ref 0.00–1.49)

## 2012-09-24 MED ORDER — LATANOPROST 0.005 % OP SOLN
1.0000 [drp] | Freq: Every day | OPHTHALMIC | Status: DC
  Filled 2012-09-24: qty 2.5

## 2012-09-24 MED ORDER — SALINE NASAL SPRAY 0.65 % NA SOLN: 1.0000 | NASAL | Status: DC | PRN

## 2012-09-24 MED ORDER — ASPIRIN EC 81 MG PO TBEC
81.0000 mg | DELAYED_RELEASE_TABLET | ORAL | Status: DC
  Administered 2012-09-24: 81 mg via ORAL
  Filled 2012-09-24: qty 1

## 2012-09-24 MED ORDER — SODIUM CHLORIDE 0.9 % IV SOLN
INTRAVENOUS | Status: DC
  Administered 2012-09-24 (×2): via INTRAVENOUS

## 2012-09-24 MED ORDER — LORAZEPAM 1 MG PO TABS
0.5000 mg | ORAL_TABLET | Freq: Once | ORAL | Status: AC
  Administered 2012-09-24: 0.5 mg via ORAL
  Filled 2012-09-24: qty 1

## 2012-09-24 MED ORDER — SODIUM CHLORIDE 0.9 % IJ SOLN: 3.0000 mL | Freq: Two times a day (BID) | INTRAMUSCULAR | Status: DC

## 2012-09-24 MED ORDER — FLUTICASONE PROPIONATE 50 MCG/ACT NA SUSP
1.0000 | Freq: Every day | NASAL | Status: DC
  Filled 2012-09-24: qty 16

## 2012-09-24 MED ORDER — IPRATROPIUM-ALBUTEROL 20-100 MCG/ACT IN AERS
1.0000 | INHALATION_SPRAY | RESPIRATORY_TRACT | Status: DC | PRN
  Filled 2012-09-24: qty 4

## 2012-09-24 MED ORDER — WARFARIN - PHARMACIST DOSING INPATIENT: Freq: Every day | Status: DC

## 2012-09-24 MED ORDER — FUROSEMIDE 40 MG PO TABS
40.0000 mg | ORAL_TABLET | Freq: Every day | ORAL | Status: DC
  Administered 2012-09-24: 40 mg via ORAL
  Filled 2012-09-24: qty 1

## 2012-09-24 MED ORDER — SIMVASTATIN 5 MG PO TABS
5.0000 mg | ORAL_TABLET | Freq: Every day | ORAL | Status: DC
  Filled 2012-09-24: qty 1

## 2012-09-24 MED ORDER — DIGOXIN 125 MCG PO TABS
125.0000 ug | ORAL_TABLET | Freq: Every day | ORAL | Status: DC
  Filled 2012-09-24: qty 1

## 2012-09-24 MED ORDER — LORATADINE 10 MG PO TABS
10.0000 mg | ORAL_TABLET | Freq: Every day | ORAL | Status: DC
  Filled 2012-09-24: qty 1

## 2012-09-24 MED ORDER — ALBUTEROL SULFATE (5 MG/ML) 0.5% IN NEBU: 2.5000 mg | INHALATION_SOLUTION | RESPIRATORY_TRACT | Status: DC | PRN

## 2012-09-24 MED ORDER — SALINE SPRAY 0.65 % NA SOLN: 1.0000 | NASAL | Status: DC | PRN

## 2012-09-24 MED ORDER — WARFARIN SODIUM 2.5 MG PO TABS
3.7500 mg | ORAL_TABLET | ORAL | Status: DC
  Filled 2012-09-24: qty 1

## 2012-09-24 MED ORDER — AMLODIPINE BESYLATE 10 MG PO TABS
10.0000 mg | ORAL_TABLET | Freq: Every day | ORAL | Status: DC
  Filled 2012-09-24: qty 1

## 2012-09-24 MED ORDER — DILTIAZEM HCL 30 MG PO TABS: 30.0000 mg | ORAL_TABLET | Freq: Two times a day (BID) | ORAL | Status: DC | PRN

## 2012-09-24 MED ORDER — IRBESARTAN 300 MG PO TABS
300.0000 mg | ORAL_TABLET | Freq: Every day | ORAL | Status: DC
  Administered 2012-09-24: 300 mg via ORAL
  Filled 2012-09-24: qty 1

## 2012-09-24 MED ORDER — WARFARIN SODIUM 2.5 MG PO TABS: 2.5000 mg | ORAL_TABLET | ORAL | Status: DC

## 2012-09-24 MED ORDER — DILTIAZEM HCL 30 MG PO TABS
30.0000 mg | ORAL_TABLET | Freq: Four times a day (QID) | ORAL | Status: DC
  Filled 2012-09-24 (×4): qty 1

## 2012-09-24 MED ORDER — AMLODIPINE BESYLATE-VALSARTAN 10-320 MG PO TABS: 1.0000 | ORAL_TABLET | Freq: Every day | ORAL | Status: DC

## 2012-09-24 MED ORDER — DILTIAZEM HCL 100 MG IV SOLR
5.0000 mg/h | INTRAVENOUS | Status: DC
  Administered 2012-09-24: 5 mg/h via INTRAVENOUS

## 2012-09-24 MED ORDER — MOMETASONE FURO-FORMOTEROL FUM 200-5 MCG/ACT IN AERO
2.0000 | INHALATION_SPRAY | Freq: Two times a day (BID) | RESPIRATORY_TRACT | Status: DC
  Filled 2012-09-24 (×2): qty 8.8

## 2012-09-24 NOTE — ED Provider Notes (Signed)
History     CSN: [id number]  Arrival date & time 09/24/12  0015   First MD Initiated Contact with Patient 09/24/12 0029      Chief Complaint  Patient presents with  . Atrial Fibrillation  . Tachycardia    (Consider location/radiation/quality/duration/timing/severity/associated sxs/prior treatment) HPI HX per PT- at home tonight and developed palpitations feels like previous episode of atrial fibrillation that last occurred about 2 years ago.  No cough, no CP, no F/C.  Sinus infection 2 weeks ago, finished course of ABx and doing well since that time. No recent change in medications. PT states she did over do it today with more than her normal degree of activity.     Past Medical History  Diagnosis Date  . Asthma   . Esophageal reflux   . CAD (coronary artery disease)   . Unspecified essential hypertension   . Glaucoma(365)   . Atrial fibrillation   . Diverticulosis     Past Surgical History  Procedure Laterality Date  . Appendectomy    . Vesicovaginal fistula closure w/ tah    . Nasal sinus surgery    . Cholecystectomy    . Coronary angioplasty with stent placement      Family History  Problem Relation Age of Onset  . Heart disease Father   . Colon cancer Mother     History  Substance Use Topics  . Smoking status: Former Smoker -- 0.20 packs/day for 25 years    Types: Cigarettes    Quit date: 07/06/1966  . Smokeless tobacco: Not on file  . Alcohol Use: No    OB History   Grav Para Term Preterm Abortions TAB SAB Ect Mult Living                  Review of Systems  Constitutional: Negative for fever and chills.  HENT: Negative for neck pain and neck stiffness.   Eyes: Negative for pain.  Respiratory: Negative for shortness of breath.   Cardiovascular: Positive for palpitations. Negative for chest pain.  Gastrointestinal: Negative for abdominal pain.  Genitourinary: Negative for dysuria.  Musculoskeletal: Negative for back pain.  Skin: Negative for  rash.  Neurological: Negative for headaches.  All other systems reviewed and are negative.    Allergies  Cephalexin; Iohexol; Penicillins; Sulfonamide derivatives; Tetracycline; and Vancomycin  Home Medications   Current Outpatient Rx  Name  Route  Sig  Dispense  Refill  . albuterol (PROVENTIL) (2.5 MG/3ML) 0.083% nebulizer solution   Nebulization   Take 3 mLs (2.5 mg total) by nebulization every 6 (six) hours as needed for wheezing. DX:  493.90   300 mL   12   . ALPRAZolam (XANAX) 0.25 MG tablet   Oral   Take 0.25 mg by mouth 3 (three) times daily as needed. For anxiety         . amLODipine-valsartan (EXFORGE) 10-320 MG per tablet   Oral   Take 1 tablet by mouth daily.           [name] arformoterol (BROVANA) 15 MCG/2ML NEBU   Nebulization   Take 2 mLs (15 mcg total) by nebulization 2 (two) times daily. DX:  493.90   120 mL   11   . aspirin EC 81 MG tablet   Oral   Take 81 mg by mouth every other day.         . budesonide (PULMICORT) 0.5 MG/2ML nebulizer solution   Nebulization   Take 2 mLs (0.5 mg total) by  nebulization 2 (two) times daily. DX:  493.90   120 mL   11   . cefdinir (OMNICEF) 300 MG capsule   Oral   Take 1 capsule (300 mg total) by mouth 2 (two) times daily. 1 tablet twice a day x10 days   20 capsule   0   . digoxin (LANOXIN) 0.125 MG tablet   Oral   Take 125 mcg by mouth daily.           . Fluticasone-Salmeterol (ADVAIR DISKUS) 500-50 MCG/DOSE AEPB   Inhalation   Inhale 1 puff into the lungs 2 (two) times daily.   60 each   1   . furosemide (LASIX) 40 MG tablet   Oral   Take 40 mg by mouth daily as needed. For fluid retention         . Ipratropium-Albuterol (COMBIVENT RESPIMAT) 20-100 MCG/ACT AERS respimat   Inhalation   Inhale 1 puff into the lungs every 4 (four) hours as needed. For rescue   1 Inhaler   5   . latanoprost (XALATAN) 0.005 % ophthalmic solution   Both Eyes   Place 1 drop into both eyes at bedtime.            . mometasone (NASONEX) 50 MCG/ACT nasal spray   Nasal   Place 2 sprays into the nose daily.         . mometasone-formoterol (DULERA) 100-5 MCG/ACT AERO   Inhalation   Inhale 2 puffs into the lungs 2 (two) times daily.   1 Inhaler   5   . omalizumab (XOLAIR) 150 MG injection   Subcutaneous   Inject into the skin every 14 (fourteen) days.          . simvastatin (ZOCOR) 5 MG tablet   Oral   Take 1 tablet by mouth at bedtime.          [name] warfarin (COUMADIN) 5 MG tablet   Oral   Take 3.75 mg by mouth every morning.           BP 159/83  Pulse 125  Temp(Src) 97.8 F (36.6 C) (Oral)  Resp 20  SpO2 99%  Physical Exam  Constitutional: She is oriented to person, place, and time. She appears well-developed and well-nourished.  HENT:  Head: Normocephalic and atraumatic.  Eyes: Conjunctivae and EOM are normal. Pupils are equal, round, and reactive to light.  Neck: Trachea normal. Neck supple. No thyromegaly present.  Cardiovascular: Normal rate, regular rhythm, S1 normal, S2 normal and normal pulses.     No systolic murmur is present   No diastolic murmur is present  Pulses:      Radial pulses are 2+ on the right side, and 2+ on the left side.  Pulmonary/Chest: Effort normal and breath sounds normal. She has no wheezes. She has no rhonchi. She has no rales. She exhibits no tenderness.  Abdominal: Soft. Normal appearance and bowel sounds are normal. There is no tenderness. There is no CVA tenderness and negative Murphy's sign.  Musculoskeletal:  1 plus symetric pretibial edema.  Calves nontender, no cords or erythema, negative Homans sign  Neurological: She is alert and oriented to person, place, and time. She has normal strength. No cranial nerve deficit or sensory deficit. GCS eye subscore is 4. GCS verbal subscore is 5. GCS motor subscore is 6.  Skin: Skin is warm and dry. No rash noted. She is not diaphoretic.  Psychiatric: Her speech is normal.  Cooperative and  appropriate  ED Course  Procedures (including critical care time)  Results for orders placed during the hospital encounter of 09/24/12  CBC      Result Value Range   WBC 9.6  4.0 - 10.5 K/uL   RBC 4.34  3.87 - 5.11 MIL/uL   Hemoglobin 13.2  12.0 - 15.0 g/dL   HCT 78.4  69.6 - 29.5 %   MCV 87.6  78.0 - 100.0 fL   MCH 30.4  26.0 - 34.0 pg   MCHC 34.7  30.0 - 36.0 g/dL   RDW 28.4  13.2 - 44.0 %   Platelets 186  150 - 400 K/uL  BASIC METABOLIC PANEL      Result Value Range   Sodium 137  135 - 145 mEq/L   Potassium 4.8  3.5 - 5.1 mEq/L   Chloride 101  96 - 112 mEq/L   CO2 26  19 - 32 mEq/L   Glucose, Bld 119 (*) 70 - 99 mg/dL   BUN 21  6 - 23 mg/dL   Creatinine, Ser 1.02  0.50 - 1.10 mg/dL   Calcium 9.1  8.4 - 72.5 mg/dL   GFR calc non Af Amer 54 (*) >90 mL/min   GFR calc Af Amer 63 (*) >90 mL/min  PROTIME-INR      Result Value Range   Prothrombin Time 28.7 (*) 11.6 - 15.2 seconds   INR 2.88 (*) 0.00 - 1.49  POCT I-STAT TROPONIN I      Result Value Range   Troponin i, poc 0.00  0.00 - 0.08 ng/mL   Comment 3            Dg Chest Port 1 View  09/24/2012  *RADIOLOGY REPORT*  Clinical Data: Atrial fibrillation, tachycardia  PORTABLE CHEST - 1 VIEW  Comparison: 02/24/2011  Findings: Heart size upper normal to mildly enlarged.  Aortic atherosclerosis.  No confluent airspace opacity.  No pleural effusion or pneumothorax.  No acute osseous finding.  IMPRESSION: Heart size upper normal to mildly enlarged.  No focal consolidation.   Original Report Authenticated By: Jearld Lesch, M.D.     CRITICAL CARE Performed by: Sunnie Nielsen   Total critical care time: 30  Critical care time was exclusive of separately billable procedures and treating other patients.  Critical care was necessary to treat or prevent imminent or life-threatening deterioration.  Critical care was time spent personally by me on the following activities: development of treatment plan with patient and/or  surrogate as well as nursing, discussions with consultants, evaluation of patient's response to treatment, examination of patient, obtaining history from patient or surrogate, ordering and performing treatments and interventions, ordering and review of laboratory studies, ordering and review of radiographic studies, pulse oximetry and re-evaluation of patient's condition. IV diltiazem drip initiated for rapid afib/ rate control.  Serial evaluations, family updated bedside, rater remains elevated, MED consult and DR Conley Rolls evaluated bedside - admit to triad service.   Date: 09/24/2012  Rate: 113  Rhythm: atrial fibrillation  QRS Axis: normal  Intervals: normal  ST/T Wave abnormalities: nonspecific ST changes  Conduction Disutrbances:none  Narrative Interpretation:   Old EKG Reviewed: unchanged  MDM  Symptomatic rapid atril fib requiring IV Diltiazem rate control  ECG, CXR, labs reviewed  VS, old records and nursing notes reviewed  MED admit        Sunnie Nielsen, MD 09/24/12 [phone]

## 2012-09-24 NOTE — Progress Notes (Signed)
ANTICOAGULATION CONSULT NOTE - Follow Up Consult  Pharmacy Consult for coumadin Indication: atrial fibrillation  Allergies  Allergen Reactions  . Brovana (Arformoterol) Other (See Comments)    Makes my heart race  . Budesonide Other (See Comments)    Makes my heart race  . Cephalexin Itching  . Iohexol      Desc: NOTES FROM PRIOR CT STATES PRE MEDICATION PRIOR TO OMNIPAQUE ENHANCED CT   . Penicillins Itching and Swelling    Swelling to ears  . Sulfonamide Derivatives Itching  . Tetracycline Itching  . Vancomycin Other (See Comments)    Turn red    Patient Measurements: Height: 5' 6.5" (168.9 cm) Weight: 140 lb 6.9 oz (63.7 kg) IBW/kg (Calculated) : 60.45 Heparin Dosing Weight:   Vital Signs: Temp: 97.5 F (36.4 C) (03/22 0508) Temp src: Oral (03/22 0508) BP: 125/62 mmHg (03/22 0508) Pulse Rate: 126 (03/22 0508)  Labs:  Recent Labs  09/24/12 0036  HGB 13.2  HCT 38.0  PLT 186  LABPROT 28.7*  INR 2.88*  CREATININE 0.93    Estimated Creatinine Clearance: 41.5 ml/min (by C-G formula based on Cr of 0.93).   Medications:  Scheduled:  . aspirin EC  81 mg Oral QODAY  . digoxin  125 mcg Oral Daily  . diltiazem  30 mg Oral Q6H  . fluticasone  1 spray Each Nare Daily  . furosemide  40 mg Oral Daily  . irbesartan  300 mg Oral Daily  . latanoprost  1 drop Both Eyes QHS  . loratadine  10 mg Oral Daily  . [COMPLETED] LORazepam  0.5 mg Oral Once  . mometasone-formoterol  2 puff Inhalation BID  . simvastatin  5 mg Oral QHS  . sodium chloride  3 mL Intravenous Q12H  . warfarin  3.75 mg Oral Custom   And  . [START ON 09/26/2012] warfarin  2.5 mg Oral Custom  . Warfarin - Pharmacist Dosing Inpatient   Does not apply q1800  . [DISCONTINUED] amLODipine  10 mg Oral Daily  . [DISCONTINUED] amLODipine-valsartan  1 tablet Oral Daily   Infusions:  . diltiazem (CARDIZEM) infusion 10 mg/hr (09/24/12 0312)  . [DISCONTINUED] sodium chloride 125 mL/hr at 09/24/12 1610     Assessment: 79 yo female with hx of afib is currently on therapeutic coumadin. INR was 2.88 Goal of Therapy:  INR 2-3    Plan:  1) Continue on coumadin 3.75mg  po qday except 2.5mg  on MF 2) INR in am if still here.  Mesha Schamberger, Tsz-Yin 09/24/2012,11:25 AM

## 2012-09-24 NOTE — ED Notes (Signed)
Presents with fatigue, feeling like her heart rate was fast. Hx afib. Pt in 110s-130s on monitor, denies chest pain, denies SOB. Alert and oriented.

## 2012-09-24 NOTE — Consult Note (Signed)
Admit date: 09/24/2012 Referring Physician  Dr. Gonzella Lex Primary Physician Herb Grays, MD Primary Cardiologist  Dr. Mayford Knife Reason for Consultation  AFIB RVR  HPI: 79 year old with AFIB, PAF, on chronic anticoagulation who was symptomatic yesterday eve with AFIB RVR in the 120's. Her last episode was 5 years ago. She is on Dig at home and has had prior discussion about possible pacer needs with Dr. Mayford Knife (had discussed with Dr. Graciela Husbands).   She converted after a few hours on IV diltiazem. She was converted to PO 30. Unfortunately her HR is in low 50's currently. Asymptomatic.   No CP, no fevers. Does have mild cough with COPD. Perhaps URI/sinus issues recently. Increased stress with her former daughter in law with cancer.   PMH:   Past Medical History  Diagnosis Date  . Asthma   . Esophageal reflux   . CAD (coronary artery disease)   . Unspecified essential hypertension   . Glaucoma(365)   . Atrial fibrillation   . Diverticulosis     PSH:   Past Surgical History  Procedure Laterality Date  . Appendectomy    . Vesicovaginal fistula closure w/ tah    . Nasal sinus surgery    . Cholecystectomy    . Coronary angioplasty with stent placement     Allergies:  Brovana; Budesonide; Cephalexin; Iohexol; Penicillins; Sulfonamide derivatives; Tetracycline; and Vancomycin Prior to Admit Meds:   Prescriptions prior to admission  Medication Sig Dispense Refill  . amLODipine-valsartan (EXFORGE) 10-320 MG per tablet Take 1 tablet by mouth daily.        . cetirizine (ZYRTEC) 10 MG tablet Take 10 mg by mouth daily as needed for allergies.      [name] digoxin (LANOXIN) 0.125 MG tablet Take 125 mcg by mouth daily.        . Fluticasone-Salmeterol (ADVAIR DISKUS) 500-50 MCG/DOSE AEPB Inhale 1 puff into the lungs 2 (two) times daily.  60 each  1  . Ipratropium-Albuterol (COMBIVENT RESPIMAT) 20-100 MCG/ACT AERS respimat Inhale 1 puff into the lungs every 4 (four) hours as needed. For rescue  1 Inhaler  5  .  latanoprost (XALATAN) 0.005 % ophthalmic solution Place 1 drop into both eyes at bedtime.        . simvastatin (ZOCOR) 5 MG tablet Take 1 tablet by mouth at bedtime.       . sodium chloride (OCEAN) 0.65 % nasal spray Place 1 spray into the nose as needed for congestion.      [name] warfarin (COUMADIN) 5 MG tablet Take 2.5-3.75 mg by mouth daily. Takes half tablet (2.5mg ) on Monday and Friday Takes 3/4 tablet (2.75mg ) the rest of the week      . albuterol (PROVENTIL) (2.5 MG/3ML) 0.083% nebulizer solution Take 3 mLs (2.5 mg total) by nebulization every 6 (six) hours as needed for wheezing. DX:  493.90  300 mL  12  . aspirin EC 81 MG tablet Take 81 mg by mouth every other day.      . furosemide (LASIX) 40 MG tablet Take 40 mg by mouth daily as needed. For fluid retention      . mometasone (NASONEX) 50 MCG/ACT nasal spray Place 2 sprays into the nose daily as needed.        Fam HX:    Family History  Problem Relation Age of Onset  . Heart disease Father   . Colon cancer Mother    Social HX:    History   Social History  . Marital Status:  Divorced    Spouse Name: N/A    Number of Children: N/A  . Years of Education: N/A   Occupational History  . Retired Engineer, building services    Social History Main Topics  . Smoking status: Former Smoker -- 0.20 packs/day for 25 years    Types: Cigarettes    Quit date: 07/06/1966  . Smokeless tobacco: Not on file  . Alcohol Use: No  . Drug Use: No  . Sexually Active: Not on file   Other Topics Concern  . Not on file   Social History Narrative  . No narrative on file     ROS:  All 11 ROS were addressed and are negative except what is stated in the HPI  Physical Exam: Blood pressure 125/62, pulse 126, temperature 97.5 F (36.4 C), temperature source Oral, resp. rate 24, height 5' 6.5" (1.689 m), weight 63.7 kg (140 lb 6.9 oz), SpO2 99.00%.    General: Well developed, well nourished, in no acute distress Head: Eyes PERRLA, No xanthomas.   Normal cephalic and  atramatic  Lungs:   Clear bilaterally to auscultation and percussion. Normal respiratory effort. No wheezes, no rales. Heart:  Brady RR S1 S2 Pulses are 2+ & equal.       No Murmer     No carotid bruit. No JVD.  No abdominal bruits. No femoral bruits. Abdomen: Bowel sounds are positive, abdomen soft and non-tender without masses. No hepatosplenomegaly. Msk:  Back normal. Normal strength and tone for age. Extremities:   No clubbing, cyanosis or edema.  DP +1 Neuro: Alert and oriented X 3, non-focal, MAE x 4 GU: Deferred Rectal: Deferred Psych:  Good affect, responds appropriately, looks younger than age.     Labs:   Lab Results  Component Value Date   WBC 9.6 09/24/2012   HGB 13.2 09/24/2012   HCT 38.0 09/24/2012   MCV 87.6 09/24/2012   PLT 186 09/24/2012    Recent Labs Lab 09/24/12 0036  NA 137  K 4.8  CL 101  CO2 26  BUN 21  CREATININE 0.93  CALCIUM 9.1  GLUCOSE 119*   No results found for this basename: PTT   Lab Results  Component Value Date   INR 2.88* 09/24/2012   INR 2.93* 02/24/2012   INR 2.94* 02/25/2011   Lab Results  Component Value Date   CKTOTAL 87 02/24/2011   CKMB 2.6 02/24/2011   TROPONINI <0.30 02/24/2011     Lab Results  Component Value Date   CHOL 120 02/24/2011   Lab Results  Component Value Date   HDL 62 02/24/2011   Lab Results  Component Value Date   LDLCALC 45 02/24/2011   Lab Results  Component Value Date   TRIG 63 02/24/2011   Lab Results  Component Value Date   CHOLHDL 1.9 02/24/2011   No results found for this basename: LDLDIRECT      Radiology:  Dg Chest Port 1 View  09/24/2012  *RADIOLOGY REPORT*  Clinical Data: Atrial fibrillation, tachycardia  PORTABLE CHEST - 1 VIEW  Comparison: 02/24/2011  Findings: Heart size upper normal to mildly enlarged.  Aortic atherosclerosis.  No confluent airspace opacity.  No pleural effusion or pneumothorax.  No acute osseous finding.  IMPRESSION: Heart size upper normal to mildly enlarged.  No  focal consolidation.   Original Report Authenticated By: Jearld Lesch, M.D.    Personally viewed.  EKG:  AFIB RVR 113, NSSTW changes.  last night. Now SB 52 no ST changes.  Personally  viewed.   ASSESSMENT/PLAN:   79 year old with PAF, episode of AFIB RVR symptomatic.   1) AFIB  - Will have issues with bradycardia if we add continuous diltiazem to her regimen. I have decided to add diltiazem 30mg  PO to be taken on PRN basis if symptomatic afib is felt. She understands to take medication and lay down. If afib does not subside, she understands to seek medical attention. She may very well require pacer in the future and we discussed.  - OK to continue Dig .   - Consider echo as outpt.   2) HTN  - stable. ARB  3) HL  - simvastatin  4) Chronic anticoagulation   - INR 2.8 theraputic coumadin.   5) COPD  - no change.   I am fine with her discharge with close follow up.   Anne Fu, Loraine Leriche, MD  09/24/2012  11:02 AM

## 2012-09-24 NOTE — Progress Notes (Signed)
Pt converted to Sinus Huston Foley, EKG done to confirm. MD made aware. Will continue to monitor.

## 2012-09-24 NOTE — H&P (Signed)
[facility] History and Physical  [name]:[id number] DOB: June 10, 1924    PCP:   Herb Grays, MD   Chief Complaint: palpitation.  HPI: [name] is an 79 y.o. female with hx of PAF, on therapeutic Coumadin, hx of CAD, asthma, HTN, presents to the ER with afib and RVR at 125.  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  She denied any chest pain or shortness of breath.  She has been compliant with all her meds.  She has no recent hx of exertional symptoms.  Evaluation in the ER included an EKG showing afib with RVR, Her labs were rather unremarkable with INR of 2.38.  Her CXR was negative as well.  She was started on IV Cardiazem, and hospitalist was asked to admit her for afib with RVR.  Rewiew of Systems:  Constitutional: Negative for malaise, fever and chills. No significant weight loss or weight gain Eyes: Negative for eye pain, redness and discharge, diplopia, visual changes, or flashes of light. ENMT: Negative for ear pain, hoarseness, nasal congestion, sinus pressure and sore throat. No headaches; tinnitus, drooling, or problem swallowing. Cardiovascular: Negative for chest pain,diaphoresis, dyspnea and peripheral edema. ; No orthopnea, PND Respiratory: Negative for cough, hemoptysis, wheezing and stridor. No pleuritic chestpain. Gastrointestinal: Negative for nausea, vomiting, diarrhea, constipation, abdominal pain, melena, blood in stool, hematemesis, jaundice and rectal bleeding.    Genitourinary: Negative for frequency, dysuria, incontinence,flank pain and hematuria; Musculoskeletal: Negative for back pain and neck pain. Negative for swelling and trauma.;  Skin: . Negative for pruritus, rash, abrasions, bruising and skin lesion.; ulcerations Neuro: Negative for headache, lightheadedness and neck stiffness. Negative for weakness, altered level of consciousness , altered mental status, extremity weakness, burning feet,  involuntary movement, seizure and syncope.  Psych: negative for anxiety, depression, insomnia, tearfulness, panic attacks, hallucinations, paranoia, suicidal or homicidal ideation    Past Medical History  Diagnosis Date  . Asthma   . Esophageal reflux   . CAD (coronary artery disease)   . Unspecified essential hypertension   . Glaucoma(365)   . Atrial fibrillation   . Diverticulosis     Past Surgical History  Procedure Laterality Date  . Appendectomy    . Vesicovaginal fistula closure w/ tah    . Nasal sinus surgery    . Cholecystectomy    . Coronary angioplasty with stent placement      Medications:  HOME MEDS: Prior to Admission medications   Medication Sig Start Date End Date Taking? Authorizing Provider  amLODipine-valsartan (EXFORGE) 10-320 MG per tablet Take 1 tablet by mouth daily.     Yes Historical Provider, MD  cetirizine (ZYRTEC) 10 MG tablet Take 10 mg by mouth daily as needed for allergies.   Yes Historical Provider, MD  digoxin (LANOXIN) 0.125 MG tablet Take 125 mcg by mouth daily.     Yes Historical Provider, MD  Fluticasone-Salmeterol (ADVAIR DISKUS) 500-50 MCG/DOSE AEPB Inhale 1 puff into the lungs 2 (two) times daily. 07/01/12  Yes Waymon Budge, MD  Ipratropium-Albuterol (COMBIVENT RESPIMAT) 20-100 MCG/ACT AERS respimat Inhale 1 puff into the lungs every 4 (four) hours as needed. For rescue 01/11/12  Yes Barbaraann Share, MD  latanoprost (XALATAN) 0.005 % ophthalmic solution Place 1 drop into both eyes at bedtime.     Yes Historical Provider, MD  simvastatin (ZOCOR) 5 MG tablet Take 1 tablet by mouth at bedtime.  05/11/11  Yes Historical Provider, MD  sodium  chloride (OCEAN) 0.65 % nasal spray Place 1 spray into the nose as needed for congestion.   Yes Historical Provider, MD  warfarin (COUMADIN) 5 MG tablet Take 2.5-3.75 mg by mouth daily. Takes half tablet (2.5mg ) on Monday and Friday Takes 3/4 tablet (2.75mg ) the rest of the week   Yes Historical Provider, MD   albuterol (PROVENTIL) (2.5 MG/3ML) 0.083% nebulizer solution Take 3 mLs (2.5 mg total) by nebulization every 6 (six) hours as needed for wheezing. DX:  493.90 03/21/12 03/21/13  Barbaraann Share, MD  aspirin EC 81 MG tablet Take 81 mg by mouth every other day.    Historical Provider, MD  furosemide (LASIX) 40 MG tablet Take 40 mg by mouth daily as needed. For fluid retention    Historical Provider, MD  mometasone (NASONEX) 50 MCG/ACT nasal spray Place 2 sprays into the nose daily as needed.     Historical Provider, MD     Allergies:  Allergies  Allergen Reactions  . Brovana (Arformoterol) Other (See Comments)    Makes my heart race  . Budesonide Other (See Comments)    Makes my heart race  . Cephalexin Itching  . Iohexol      Desc: NOTES FROM PRIOR CT STATES PRE MEDICATION PRIOR TO OMNIPAQUE ENHANCED CT   . Penicillins Itching and Swelling    Swelling to ears  . Sulfonamide Derivatives Itching  . Tetracycline Itching  . Vancomycin Other (See Comments)    Turn red    Social History:   reports that she quit smoking about 46 years ago. Her smoking use included Cigarettes. She has a 5 pack-year smoking history. She does not have any smokeless tobacco history on file. She reports that she does not drink alcohol or use illicit drugs.  Family History: Family History  Problem Relation Age of Onset  . Heart disease Father   . Colon cancer Mother      Physical Exam: Filed Vitals:   09/24/12 0130 09/24/12 0200 09/24/12 0236 09/24/12 0245  BP: 137/61 133/58 146/81   Pulse: 96 110 120 66  Temp:      TempSrc:      Resp: 20 22 18 20   SpO2: 100% 100% 98% 100%   Blood pressure 146/81, pulse 66, temperature 97.8 F (36.6 C), temperature source Oral, resp. rate 20, SpO2 100.00%.  GEN:  Pleasant  patient lying in the stretcher in no acute distress; cooperative with exam. PSYCH:  alert and oriented x4; does not appear anxious or depressed; affect is appropriate. HEENT: Mucous membranes  pink and anicteric; PERRLA; EOM intact; no cervical lymphadenopathy nor thyromegaly or carotid bruit; no JVD; There were no stridor. Neck is very supple. Breasts:: Not examined CHEST WALL: No tenderness CHEST: Normal respiration, clear to auscultation bilaterally.  HEART: Irregular rate and rhythm.  There are no murmur, rub, or gallops.   BACK: No kyphosis or scoliosis; no CVA tenderness ABDOMEN: soft and non-tender; no masses, no organomegaly, normal abdominal bowel sounds; no pannus; no intertriginous candida. There is no rebound and no distention. Rectal Exam: Not done EXTREMITIES: No bone or joint deformity; age-appropriate arthropathy of the hands and knees; no edema; no ulcerations.  There is no calf tenderness. Genitalia: not examined PULSES: 2+ and symmetric SKIN: Normal hydration no rash or ulceration CNS: Cranial nerves 2-12 grossly intact no focal lateralizing neurologic deficit.  Speech is fluent; uvula elevated with phonation, facial symmetry and tongue midline. DTR are normal bilaterally, cerebella exam is intact, barbinski is negative and strengths  are equaled bilaterally.  No sensory loss.   Labs on Admission:  Basic Metabolic Panel:  Recent Labs Lab 09/24/12 0036  NA 137  K 4.8  CL 101  CO2 26  GLUCOSE 119*  BUN 21  CREATININE 0.93  CALCIUM 9.1   Liver Function Tests: No results found for this basename: AST, ALT, ALKPHOS, BILITOT, PROT, ALBUMIN,  in the last 168 hours No results found for this basename: LIPASE, AMYLASE,  in the last 168 hours No results found for this basename: AMMONIA,  in the last 168 hours CBC:  Recent Labs Lab 09/24/12 0036  WBC 9.6  HGB 13.2  HCT 38.0  MCV 87.6  PLT 186   Cardiac Enzymes: No results found for this basename: CKTOTAL, CKMB, CKMBINDEX, TROPONINI,  in the last 168 hours  CBG: No results found for this basename: GLUCAP,  in the last 168 hours   Radiological Exams on Admission: Dg Chest Port 1 View  09/24/2012   *RADIOLOGY REPORT*  Clinical Data: Atrial fibrillation, tachycardia  PORTABLE CHEST - 1 VIEW  Comparison: 02/24/2011  Findings: Heart size upper normal to mildly enlarged.  Aortic atherosclerosis.  No confluent airspace opacity.  No pleural effusion or pneumothorax.  No acute osseous finding.  IMPRESSION: Heart size upper normal to mildly enlarged.  No focal consolidation.   Original Report Authenticated By: Jearld Lesch, M.D.     EKG: Independently reviewed. Afib with RVR.   Assessment/Plan Present on Admission:  . Paroxysmal a-fib . CAD (coronary artery disease) HTN Anxiety Rapid afib ++ Chronic anticoagulation.  PLAN:  Will admit her to telemetry for rate control.  I wil continue her on Coumadin.  She can be transitioned to oral med tomorrow.  I will continue her Digitalis and check a level.  Will check her thyroid fx test as well.  She is stable, full code, and will be admitted to [facility] service under telemetry.  Other plans as per orders.  Code Status: FULL Unk Lightning, MD. [facility] Pager [phone] 7pm to 7am.  09/24/2012, 3:28 AM

## 2012-09-24 NOTE — ED Notes (Signed)
Attempted to call report to floor 

## 2012-09-24 NOTE — Discharge Summary (Signed)
Physician Discharge Summary  [name]:[id number] DOB: 1923-08-25 DOA: 09/24/2012  PCP: Herb Grays, MD  Admit date: 09/24/2012 Discharge date: 09/24/2012  Time spent:  25 minutes  Recommendations for Outpatient Follow-up:  1. Home with outpatient PCP and cardiology followup.  Discharge Diagnoses:   Principal diagnosis Rapid A. Fib  Active Problems: Asthma  CAD (coronary artery disease)    Discharge Condition: Fair  Diet recommendation: Cardiac  Filed Weights   09/24/12 0508  Weight: 63.7 kg (140 lb 6.9 oz)    History of present illness:  Please refer to the admission H&P for details but in brief 79 y.o. female with hx of PAF, on therapeutic Coumadin, hx of CAD, asthma, HTN, presents to the ER with afib and RVR at 125. 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 She denied any chest pain or shortness of breath. She has been compliant with all her meds. She has no recent hx of exertional symptoms. Evaluation in the ER included an EKG showing afib with RVR, Her labs were rather unremarkable with INR of 2.38. Her CXR was negative as well. She was started on IV Cardiazem, and hospitalist was asked to admit her for afib with RVR.   Hospital Course:   A. Fib with RVR No clear triggering symptoms. Patient was placed on Cardizem drip and her heart rate improved. For this morning she was noted to be bradycardic to 40s and Cardizem drip was stopped. She follows with Dr. Mayford Knife with [facility] cardiology and does not remember having her recent echocardiogram. She was seen by Dr. Anne Fu and given that her heart rate was down to 40s and 50s recommended to prescribe her low dose Cardizem only as needed when she has symptoms of palpitations. Given her symptoms of A. fib with bradycardia she will be discussed about pacemaker option by a cardiologist as outpatient. Patient is quite aware and compliant with her medical condition. She's asymptomatic at this  time. Her home dose of digoxin has been resumed. She is on Coumadin for A. fib with therapeutic INR as well. Given underlying asthma she is likely not on a beta blocker. -She is clinically stable and can be discharged home with outpatient followup with Dr. Mayford Knife in one week.  Procedures:  None  Consultations:  Dr. Anne Fu [facility] cardiology)  Discharge Exam: Filed Vitals:   09/24/12 0330 09/24/12 0508 09/24/12 1000 09/24/12 1219  BP: 124/57 125/62  127/51  Pulse: 90 126 52 51  Temp:  97.5 F (36.4 C)    TempSrc:  Oral    Resp: 27 24    Height:  5' 6.5" (1.689 m)    Weight:  63.7 kg (140 lb 6.9 oz)    SpO2: 99% 99%      General: Elderly female lying in bed in no acute distress HEENT: No pallor, moist oral mucosa Cardiovascular: S1 and S2 irregular, no murmurs rub or gallop Respiratory: Clear to auscultation bilaterally, no added sounds : Soft, nontender, nondistended, bowel sounds present Extremities: Warm, no edema CNS: AAO x3  Discharge Instructions   Future Appointments Provider Department Dept Phone   11/08/2012 11:15 AM Barbaraann Share, MD [facility] [phone]       Medication List    TAKE these medications       albuterol (2.5 MG/3ML) 0.083% nebulizer solution  Commonly known as:  PROVENTIL  Take 3 mLs (2.5 mg total) by nebulization every 6 (six) hours as needed for  wheezing. DX:  493.90     amLODipine-valsartan 10-320 MG per tablet  Commonly known as:  EXFORGE  Take 1 tablet by mouth daily.     aspirin EC 81 MG tablet  Take 81 mg by mouth every other day.     cetirizine 10 MG tablet  Commonly known as:  ZYRTEC  Take 10 mg by mouth daily as needed for allergies.     digoxin 0.125 MG tablet  Commonly known as:  LANOXIN  Take 125 mcg by mouth daily.     diltiazem 30 MG tablet  Commonly known as:  CARDIZEM  Take 1 tablet (30 mg total) by mouth 2 (two) times daily as needed (for palpitations).     Fluticasone-Salmeterol 500-50 MCG/DOSE  Aepb  Commonly known as:  ADVAIR DISKUS  Inhale 1 puff into the lungs 2 (two) times daily.     furosemide 40 MG tablet  Commonly known as:  LASIX  Take 40 mg by mouth daily as needed. For fluid retention     Ipratropium-Albuterol 20-100 MCG/ACT Aers respimat  Commonly known as:  COMBIVENT RESPIMAT  Inhale 1 puff into the lungs every 4 (four) hours as needed. For rescue     latanoprost 0.005 % ophthalmic solution  Commonly known as:  XALATAN  Place 1 drop into both eyes at bedtime.     mometasone 50 MCG/ACT nasal spray  Commonly known as:  NASONEX  Place 2 sprays into the nose daily as needed.     simvastatin 5 MG tablet  Commonly known as:  ZOCOR  Take 1 tablet by mouth at bedtime.     sodium chloride 0.65 % nasal spray  Commonly known as:  OCEAN  Place 1 spray into the nose as needed for congestion.     warfarin 5 MG tablet  Commonly known as:  COUMADIN  Take 2.5-3.75 mg by mouth daily. Takes half tablet (2.5mg ) on Monday and Friday  Takes 3/4 tablet (2.75mg ) the rest of the week           Follow-up Information   Follow up with Quintella Reichert, MD. Schedule an appointment as soon as possible for a visit in 4 days.   Contact information:   [address] [place] [address] [phone]       Follow up with Herb Grays, MD In 1 week.   Contact information:   [address] [place] [address] [phone]        The results of significant diagnostics from this hospitalization (including imaging, microbiology, ancillary and laboratory) are listed below for reference.    Significant Diagnostic Studies: Dg Chest Port 1 View  09/24/2012  *RADIOLOGY REPORT*  Clinical Data: Atrial fibrillation, tachycardia  PORTABLE CHEST - 1 VIEW  Comparison: 02/24/2011  Findings: Heart size upper normal to mildly enlarged.  Aortic atherosclerosis.  No confluent airspace opacity.  No pleural effusion or pneumothorax.  No acute  osseous finding.  IMPRESSION: Heart size upper normal to mildly enlarged.  No focal consolidation.   Original Report Authenticated By: Jearld Lesch, M.D.     Microbiology: No results found for this or any previous visit (from the past 240 hour(s)).   Labs: Basic Metabolic Panel:  Recent Labs Lab 09/24/12 0036  NA 137  K 4.8  CL 101  CO2 26  GLUCOSE 119*  BUN 21  CREATININE 0.93  CALCIUM 9.1   Liver Function Tests: No results found for this  basename: AST, ALT, ALKPHOS, BILITOT, PROT, ALBUMIN,  in the last 168 hours No results found for this basename: LIPASE, AMYLASE,  in the last 168 hours No results found for this basename: AMMONIA,  in the last 168 hours CBC:  Recent Labs Lab 09/24/12 0036  WBC 9.6  HGB 13.2  HCT 38.0  MCV 87.6  PLT 186   Cardiac Enzymes: No results found for this basename: CKTOTAL, CKMB, CKMBINDEX, TROPONINI,  in the last 168 hours BNP: BNP (last 3 results) No results found for this basename: PROBNP,  in the last 8760 hours CBG: No results found for this basename: GLUCAP,  in the last 168 hours     Signed:  Eddie North  [facility] 09/24/2012, 12:59 PM

## 2012-09-24 NOTE — Progress Notes (Signed)
Pt discharged Per MD order and protocol. All discharge instructions reviewed with patient and all questions answered. Pt aware of all follow up appointments. All rx'es given.

## 2012-09-24 NOTE — Progress Notes (Signed)
ANTICOAGULATION CONSULT NOTE - Initial Consult  Pharmacy Consult for Coumadin Indication: h/o Afib  Allergies  Allergen Reactions  . Brovana (Arformoterol) Other (See Comments)    Makes my heart race  . Budesonide Other (See Comments)    Makes my heart race  . Cephalexin Itching  . Iohexol      Desc: NOTES FROM PRIOR CT STATES PRE MEDICATION PRIOR TO OMNIPAQUE ENHANCED CT   . Penicillins Itching and Swelling    Swelling to ears  . Sulfonamide Derivatives Itching  . Tetracycline Itching  . Vancomycin Other (See Comments)    Turn red    Patient Measurements: Height: 5' 6.5" (168.9 cm) Weight: 140 lb 6.9 oz (63.7 kg) IBW/kg (Calculated) : 60.45  Vital Signs: Temp: 97.5 F (36.4 C) (03/22 0508) Temp src: Oral (03/22 0508) BP: 125/62 mmHg (03/22 0508) Pulse Rate: 126 (03/22 0508)  Labs:  Recent Labs  09/24/12 0036  HGB 13.2  HCT 38.0  PLT 186  LABPROT 28.7*  INR 2.88*  CREATININE 0.93    Estimated Creatinine Clearance: 41.5 ml/min (by C-G formula based on Cr of 0.93).   Medical History: Past Medical History  Diagnosis Date  . Asthma   . Esophageal reflux   . CAD (coronary artery disease)   . Unspecified essential hypertension   . Glaucoma(365)   . Atrial fibrillation   . Diverticulosis     Medications:  Prescriptions prior to admission  Medication Sig Dispense Refill  . amLODipine-valsartan (EXFORGE) 10-320 MG per tablet Take 1 tablet by mouth daily.        . cetirizine (ZYRTEC) 10 MG tablet Take 10 mg by mouth daily as needed for allergies.      [name] digoxin (LANOXIN) 0.125 MG tablet Take 125 mcg by mouth daily.        . Fluticasone-Salmeterol (ADVAIR DISKUS) 500-50 MCG/DOSE AEPB Inhale 1 puff into the lungs 2 (two) times daily.  60 each  1  . Ipratropium-Albuterol (COMBIVENT RESPIMAT) 20-100 MCG/ACT AERS respimat Inhale 1 puff into the lungs every 4 (four) hours as needed. For rescue  1 Inhaler  5  . latanoprost (XALATAN) 0.005 % ophthalmic solution  Place 1 drop into both eyes at bedtime.        . simvastatin (ZOCOR) 5 MG tablet Take 1 tablet by mouth at bedtime.       . sodium chloride (OCEAN) 0.65 % nasal spray Place 1 spray into the nose as needed for congestion.      [name] warfarin (COUMADIN) 5 MG tablet Take 2.5-3.75 mg by mouth daily. Takes half tablet (2.5mg ) on Monday and Friday Takes 3/4 tablet (2.75mg ) the rest of the week      . albuterol (PROVENTIL) (2.5 MG/3ML) 0.083% nebulizer solution Take 3 mLs (2.5 mg total) by nebulization every 6 (six) hours as needed for wheezing. DX:  493.90  300 mL  12  . aspirin EC 81 MG tablet Take 81 mg by mouth every other day.      . furosemide (LASIX) 40 MG tablet Take 40 mg by mouth daily as needed. For fluid retention      . mometasone (NASONEX) 50 MCG/ACT nasal spray Place 2 sprays into the nose daily as needed.         Assessment: 79 yo female with palpitations, h/o Afib on Coumadin, to continue anticoagulation  Goal of Therapy:  INR 2-3 Monitor platelets by anticoagulation protocol: Yes   Plan:  Continue home Coumadin regimen Daily INR  Diahn Waidelich, Gary Fleet  09/24/2012,5:18 AM

## 2012-09-26 ENCOUNTER — Other Ambulatory Visit: Payer: Self-pay | Admitting: Internal Medicine

## 2012-11-08 ENCOUNTER — Ambulatory Visit (INDEPENDENT_AMBULATORY_CARE_PROVIDER_SITE_OTHER): Payer: Medicare Other | Admitting: Pulmonary Disease

## 2012-11-08 ENCOUNTER — Encounter: Payer: Self-pay | Admitting: Pulmonary Disease

## 2012-11-08 VITALS — BP 142/58 | HR 64 | Temp 96.1°F | Ht 67.0 in | Wt 145.8 lb

## 2012-11-08 DIAGNOSIS — J301 Allergic rhinitis due to pollen: Secondary | ICD-10-CM

## 2012-11-08 DIAGNOSIS — J45909 Unspecified asthma, uncomplicated: Secondary | ICD-10-CM

## 2012-11-08 MED ORDER — METHYLPREDNISOLONE 16 MG PO TABS: ORAL_TABLET | ORAL | Status: DC

## 2012-11-08 NOTE — Assessment & Plan Note (Signed)
Surprisingly, the patient's asthma is staying well controlled through this episode of allergic rhinitis.  However, she is at high risk for decompensation with any type of flareup.  Again, I think it's very important that we get her back on Xolair

## 2012-11-08 NOTE — Assessment & Plan Note (Signed)
She is having significant allergic rhinitis versus sinusitis.  I would like to treat her with a course of steroids to see if things improve, but she may need antibiotics if she begins to have purulent mucus from her nose.  I also want to get her back on Xolair.

## 2012-11-08 NOTE — Progress Notes (Signed)
  Subjective:    Patient ID: [name], female    DOB: 12-07-1923, 79 y.o.   MRN: [id number]  HPI The patient comes in today for followup of her known chronic obstructive asthma with significant allergic component.  She is maintaining on her bronchodilator regimen, but unfortunately recently lost funding for her Xolair injections.  I was not aware of this.  She now comes in with increasing allergy symptoms, nasal congestion, but surprisingly not increased shortness of breath.  She is not having any purulent mucus from her nose or from her chest currently.   Review of Systems  Constitutional: Negative for fever and unexpected weight change.  HENT: Positive for congestion, rhinorrhea, sneezing, postnasal drip and sinus pressure. Negative for ear pain, nosebleeds, sore throat, trouble swallowing and dental problem.   Eyes: Positive for itching. Negative for redness.  Respiratory: Negative for cough, chest tightness, shortness of breath and wheezing.   Cardiovascular: Negative for palpitations and leg swelling.  Gastrointestinal: Negative for nausea and vomiting.  Genitourinary: Negative for dysuria.  Musculoskeletal: Negative for joint swelling.  Skin: Negative for rash.  Neurological: Positive for dizziness. Negative for headaches.  Hematological: Does not bruise/bleed easily.  Psychiatric/Behavioral: Negative for dysphoric mood. The patient is not nervous/anxious.        Objective:   Physical Exam Frail-appearing female in no acute distress Nose without purulence or discharge noted Neck without lymphadenopathy or thyromegaly Chest with an isolated rhonchi and polyp in the left base, otherwise clear with adequate air flow. Cardiac exam with regular rate and rhythm Lower extremities with 2+ edema bilaterally, no cyanosis Alert and oriented, moves all 4 extremities.       Assessment & Plan:

## 2012-11-08 NOTE — Patient Instructions (Addendum)
Will treat you with a short course of medrol to get you thru this episode. Please call if you begin to get nasty mucus out of your sinuses. No change in asthma medication.  Will try and get you back on xolair injections.  followup with me in six months if doing well.

## 2012-11-08 NOTE — Addendum Note (Signed)
Addended by: Nita Sells on: 11/08/2012 12:10 PM   Modules accepted: Orders

## 2012-11-09 ENCOUNTER — Telehealth: Payer: Self-pay | Admitting: Pulmonary Disease

## 2012-11-09 MED ORDER — LEVOFLOXACIN 750 MG PO TABS: 750.0000 mg | ORAL_TABLET | Freq: Every day | ORAL | Status: DC

## 2012-11-09 NOTE — Telephone Encounter (Signed)
See if she will stay on steroids another 24hrs, and if doing ok, finish up taper.  A heart rate of 94 is actually normal.  Ok to call in levaquin 750mg  one a day for 5 days.

## 2012-11-09 NOTE — Telephone Encounter (Signed)
i spoke with pt and is aware of recs. rx was called in. Nothing further was needed

## 2012-11-09 NOTE — Telephone Encounter (Signed)
Spoke with pt She states that she started noticing her heart was beating "really heavy" last night, checked pulse and it was 94 She wants to stop the medrol since she "hates taking steroids" She states that since this am has starting to cough up moderate amounts of dark grey sputum  Wants abx, as this was discussed with KC at ov 11/08/12 Please advise thanks! Allergies  Allergen Reactions  . Brovana (Arformoterol) Other (See Comments)    Makes my heart race  . Budesonide Other (See Comments)    Makes my heart race  . Cephalexin Itching  . Iohexol      Desc: NOTES FROM PRIOR CT STATES PRE MEDICATION PRIOR TO OMNIPAQUE ENHANCED CT   . Penicillins Itching and Swelling    Swelling to ears  . Sulfonamide Derivatives Itching  . Tetracycline Itching  . Vancomycin Other (See Comments)    Turn red

## 2012-11-29 ENCOUNTER — Ambulatory Visit: Payer: Medicare Other

## 2012-12-28 ENCOUNTER — Emergency Department (HOSPITAL_COMMUNITY): Payer: Medicare Other

## 2012-12-28 ENCOUNTER — Emergency Department (HOSPITAL_COMMUNITY)
Admission: EM | Admit: 2012-12-28 | Discharge: 2012-12-28 | Disposition: A | Discharge status: Home / Self Care | Payer: Medicare Other | Attending: Emergency Medicine | Admitting: Emergency Medicine

## 2012-12-28 ENCOUNTER — Encounter (HOSPITAL_COMMUNITY): Payer: Self-pay | Admitting: Emergency Medicine

## 2012-12-28 DIAGNOSIS — E860 Dehydration: Secondary | ICD-10-CM

## 2012-12-28 DIAGNOSIS — Z87891 Personal history of nicotine dependence: Secondary | ICD-10-CM | POA: Insufficient documentation

## 2012-12-28 DIAGNOSIS — Z8669 Personal history of other diseases of the nervous system and sense organs: Secondary | ICD-10-CM | POA: Insufficient documentation

## 2012-12-28 DIAGNOSIS — R358 Other polyuria: Secondary | ICD-10-CM | POA: Insufficient documentation

## 2012-12-28 DIAGNOSIS — I10 Essential (primary) hypertension: Secondary | ICD-10-CM | POA: Insufficient documentation

## 2012-12-28 DIAGNOSIS — Z8719 Personal history of other diseases of the digestive system: Secondary | ICD-10-CM | POA: Insufficient documentation

## 2012-12-28 DIAGNOSIS — R3589 Other polyuria: Secondary | ICD-10-CM | POA: Insufficient documentation

## 2012-12-28 DIAGNOSIS — Z7901 Long term (current) use of anticoagulants: Secondary | ICD-10-CM | POA: Insufficient documentation

## 2012-12-28 DIAGNOSIS — Z79899 Other long term (current) drug therapy: Secondary | ICD-10-CM | POA: Insufficient documentation

## 2012-12-28 DIAGNOSIS — J45909 Unspecified asthma, uncomplicated: Secondary | ICD-10-CM | POA: Insufficient documentation

## 2012-12-28 DIAGNOSIS — I2510 Atherosclerotic heart disease of native coronary artery without angina pectoris: Secondary | ICD-10-CM | POA: Insufficient documentation

## 2012-12-28 DIAGNOSIS — Z7982 Long term (current) use of aspirin: Secondary | ICD-10-CM | POA: Insufficient documentation

## 2012-12-28 DIAGNOSIS — R197 Diarrhea, unspecified: Secondary | ICD-10-CM | POA: Insufficient documentation

## 2012-12-28 DIAGNOSIS — IMO0002 Reserved for concepts with insufficient information to code with codable children: Secondary | ICD-10-CM | POA: Insufficient documentation

## 2012-12-28 DIAGNOSIS — I4891 Unspecified atrial fibrillation: Secondary | ICD-10-CM | POA: Insufficient documentation

## 2012-12-28 DIAGNOSIS — R5381 Other malaise: Secondary | ICD-10-CM | POA: Insufficient documentation

## 2012-12-28 LAB — COMPREHENSIVE METABOLIC PANEL
AST: 20 U/L (ref 0–37)
Albumin: 3.8 g/dL (ref 3.5–5.2)
Alkaline Phosphatase: 61 U/L (ref 39–117)
BUN: 10 mg/dL (ref 6–23)
CO2: 27 mEq/L (ref 19–32)
Chloride: 101 mEq/L (ref 96–112)
GFR calc non Af Amer: 74 mL/min — ABNORMAL LOW (ref >90)
Potassium: 3.8 mEq/L (ref 3.5–5.1)
Total Bilirubin: 0.3 mg/dL (ref 0.3–1.2)

## 2012-12-28 LAB — CBC
HCT: 39.9 % (ref 36.0–46.0)
Hemoglobin: 13.8 g/dL (ref 12.0–15.0)
MCH: 29.9 pg (ref 26.0–34.0)
MCHC: 34.6 g/dL (ref 30.0–36.0)
RBC: 4.62 MIL/uL (ref 3.87–5.11)

## 2012-12-28 LAB — URINALYSIS, ROUTINE W REFLEX MICROSCOPIC
Leukocytes, UA: NEGATIVE
Protein, ur: NEGATIVE mg/dL
Urobilinogen, UA: 0.2 mg/dL (ref 0.0–1.0)

## 2012-12-28 LAB — PROTIME-INR: Prothrombin Time: 22.4 seconds — ABNORMAL HIGH (ref 11.6–15.2)

## 2012-12-28 MED ORDER — SODIUM CHLORIDE 0.9 % IV BOLUS (SEPSIS)
2000.0000 mL | Freq: Once | INTRAVENOUS | Status: AC
  Administered 2012-12-28: 2000 mL via INTRAVENOUS

## 2012-12-28 MED ORDER — ALPRAZOLAM 0.25 MG PO TABS
0.2500 mg | ORAL_TABLET | Freq: Once | ORAL | Status: AC
  Administered 2012-12-28: 0.25 mg via ORAL
  Filled 2012-12-28: qty 1

## 2012-12-28 MED ORDER — ALPRAZOLAM 0.25 MG PO TABS: 0.2500 mg | ORAL_TABLET | Freq: Two times a day (BID) | ORAL | Status: DC | PRN

## 2012-12-28 NOTE — ED Notes (Signed)
Pt aware of need for stool sample. Reports she doesn't think she will be able to go for a while because she has already gone earlier today.

## 2012-12-28 NOTE — ED Notes (Addendum)
Pt c/o generalized weakness X 2 weeks, diarrhea on and off for about a month. Pt was in a.fib this morning, has hx of a.fib. Pt reports she had palpitations earlier today but they are now gone, her cardiologist said not to worry too much about those since she gets them often. Pt reports she hasn't had any taste or smell along with sinus congestion and was on there way there when her granddaughter thought it was best to come to ED when she noticed she was in a.fib and has been tired. sts she hasn't been able to sleep much at all the past couple weeks. Denies CP and SOB, Pt in nad ,skin warm and dry, resp e/u.

## 2012-12-28 NOTE — ED Provider Notes (Addendum)
History    CSN: [id number] Arrival date & time 12/28/12  1338  First MD Initiated Contact with Patient 12/28/12 1528     Chief Complaint  Patient presents with  . Palpitations  . Diarrhea  . Weakness   (Consider location/radiation/quality/duration/timing/severity/associated sxs/prior Treatment) HPI   Complains of generalized weakness, diarrhea and general was malaise for approximately one week. Admits to lightheadedness, worse with standing, improved with supine position. Patient had 2 or 3 episodes of diarrhea per day for the past several days. Patient recently completed 10 day course of an antibiotic for sinusitis. Note other treatment prior to coming here. No fever. No abdominal pain. No headache. No chest pain. She felt she may have been in atrial fibrillation earlier today. Patient has chronic atrial fibrillation which she goes in and out of. Also admits to feeling thirsty. No other associated symptoms. Past Medical History  Diagnosis Date  . Asthma   . Esophageal reflux   . CAD (coronary artery disease)   . Unspecified essential hypertension   . Glaucoma   . Atrial fibrillation   . Diverticulosis    Past Surgical History  Procedure Laterality Date  . Appendectomy    . Vesicovaginal fistula closure w/ tah    . Nasal sinus surgery    . Cholecystectomy    . Coronary angioplasty with stent placement     Family History  Problem Relation Age of Onset  . Heart disease Father   . Colon cancer Mother    History  Substance Use Topics  . Smoking status: Former Smoker -- 0.20 packs/day for 25 years    Types: Cigarettes    Quit date: 07/06/1966  . Smokeless tobacco: Not on file  . Alcohol Use: No   OB History   Grav Para Term Preterm Abortions TAB SAB Ect Mult Living                 Review of Systems  Constitutional:       General malaise  HENT: Positive for congestion.   Respiratory: Negative.   Cardiovascular: Positive for palpitations.  Gastrointestinal:  Positive for diarrhea. Negative for blood in stool.  Endocrine: Positive for polyuria.  Musculoskeletal: Negative.   Skin: Negative.   Neurological: Positive for weakness.  Psychiatric/Behavioral:       Anxiety  All other systems reviewed and are negative.    Allergies  Brovana; Budesonide; Cephalexin; Iohexol; Penicillins; Sulfonamide derivatives; Tetracycline; and Vancomycin  Home Medications   Current Outpatient Rx  Name  Route  Sig  Dispense  Refill  . acetaminophen (TYLENOL) 325 MG tablet   Oral   Take 325 mg by mouth daily as needed for pain.         [name] albuterol (PROVENTIL) (2.5 MG/3ML) 0.083% nebulizer solution   Nebulization   Take 3 mLs (2.5 mg total) by nebulization every 6 (six) hours as needed for wheezing. DX:  493.90   300 mL   12   . amLODipine-valsartan (EXFORGE) 10-320 MG per tablet   Oral   Take 1 tablet by mouth daily.           [name] aspirin EC 81 MG tablet   Oral   Take 81 mg by mouth every other day.         . cetirizine (ZYRTEC) 10 MG tablet   Oral   Take 10 mg by mouth daily as needed for allergies.         [name] digoxin (LANOXIN) 0.125 MG tablet  Oral   Take 0.125 mg by mouth daily.          . Fluticasone-Salmeterol (ADVAIR) 500-50 MCG/DOSE AEPB   Inhalation   Inhale 1 puff into the lungs 2 (two) times daily.         . furosemide (LASIX) 40 MG tablet   Oral   Take 40 mg by mouth daily as needed for fluid.          [name] Hypertonic Nasal Wash (SINUS RINSE BOTTLE KIT NA)      Lloyd Huger Med --- use as needed each nostril         . Ipratropium-Albuterol (COMBIVENT RESPIMAT) 20-100 MCG/ACT AERS respimat   Inhalation   Inhale 1 puff into the lungs every 4 (four) hours as needed. For rescue   1 Inhaler   5   . latanoprost (XALATAN) 0.005 % ophthalmic solution   Both Eyes   Place 1 drop into both eyes at bedtime.           . mometasone (NASONEX) 50 MCG/ACT nasal spray   Nasal   Place 2 sprays into the nose daily.         .  simvastatin (ZOCOR) 5 MG tablet   Oral   Take 1 tablet by mouth at bedtime.          . sodium chloride (OCEAN) 0.65 % nasal spray   Nasal   Place 1 spray into the nose as needed for congestion.         [name] warfarin (COUMADIN) 5 MG tablet   Oral   Take 2.5-3.75 mg by mouth See admin instructions. Takes half tablet (2.5mg ) on Monday and Friday Takes 3/4 tablet (2.75mg ) the rest of the week         . levofloxacin (LEVAQUIN) 750 MG tablet   Oral   Take 1 tablet (750 mg total) by mouth daily.   5 tablet   0   . methylPREDNISolone (MEDROL) 16 MG tablet      Take 2 tablets daily x 2 days, then 1.5 x 2 days, then 1 x 2 days, then .5 x 2 days then stop.   10 tablet   0    BP 143/59  Pulse 84  Temp(Src) 97.7 F (36.5 C) (Oral)  Resp 14  SpO2 98% Physical Exam  Nursing note and vitals reviewed. Constitutional: She appears well-developed and well-nourished.  HENT:  Head: Normocephalic and atraumatic.  Mucous membranes dry  Eyes: Conjunctivae are normal. Pupils are equal, round, and reactive to light.  Neck: Neck supple. No tracheal deviation present. No thyromegaly present.  Cardiovascular: Normal rate and regular rhythm.   No murmur heard. Pulmonary/Chest: Effort normal and breath sounds normal.  Abdominal: Soft. Bowel sounds are normal. She exhibits no distension. There is no tenderness.  Musculoskeletal: Normal range of motion. She exhibits no edema and no tenderness.  Neurological: She is alert. Coordination normal.  Skin: Skin is warm and dry. No rash noted.  Psychiatric: She has a normal mood and affect.    ED Course  Procedures (including critical care time) Labs Reviewed  PROTIME-INR - Abnormal; Notable for the following:    Prothrombin Time 22.4 (*)    INR 2.04 (*)    All other components within normal limits  COMPREHENSIVE METABOLIC PANEL - Abnormal; Notable for the following:    Glucose, Bld 112 (*)    GFR calc non Af Amer 74 (*)    GFR calc Af Amer 86  (*)  All other components within normal limits  CBC  POCT I-STAT TROPONIN I   Results for orders placed during the hospital encounter of 12/28/12  CBC      Result Value Range   WBC 9.9  4.0 - 10.5 K/uL   RBC 4.62  3.87 - 5.11 MIL/uL   Hemoglobin 13.8  12.0 - 15.0 g/dL   HCT 16.1  09.6 - 04.5 %   MCV 86.4  78.0 - 100.0 fL   MCH 29.9  26.0 - 34.0 pg   MCHC 34.6  30.0 - 36.0 g/dL   RDW 40.9  81.1 - 91.4 %   Platelets 197  150 - 400 K/uL  PROTIME-INR      Result Value Range   Prothrombin Time 22.4 (*) 11.6 - 15.2 seconds   INR 2.04 (*) 0.00 - 1.49  COMPREHENSIVE METABOLIC PANEL      Result Value Range   Sodium 138  135 - 145 mEq/L   Potassium 3.8  3.5 - 5.1 mEq/L   Chloride 101  96 - 112 mEq/L   CO2 27  19 - 32 mEq/L   Glucose, Bld 112 (*) 70 - 99 mg/dL   BUN 10  6 - 23 mg/dL   Creatinine, Ser 7.82  0.50 - 1.10 mg/dL   Calcium 9.1  8.4 - 95.6 mg/dL   Total Protein 7.6  6.0 - 8.3 g/dL   Albumin 3.8  3.5 - 5.2 g/dL   AST 20  0 - 37 U/L   ALT 13  0 - 35 U/L   Alkaline Phosphatase 61  39 - 117 U/L   Total Bilirubin 0.3  0.3 - 1.2 mg/dL   GFR calc non Af Amer 74 (*) >90 mL/min   GFR calc Af Amer 86 (*) >90 mL/min  URINALYSIS, ROUTINE W REFLEX MICROSCOPIC      Result Value Range   Color, Urine YELLOW  YELLOW   APPearance CLEAR  CLEAR   Specific Gravity, Urine 1.007  1.005 - 1.030   pH 6.0  5.0 - 8.0   Glucose, UA NEGATIVE  NEGATIVE mg/dL   Hgb urine dipstick NEGATIVE  NEGATIVE   Bilirubin Urine NEGATIVE  NEGATIVE   Ketones, ur NEGATIVE  NEGATIVE mg/dL   Protein, ur NEGATIVE  NEGATIVE mg/dL   Urobilinogen, UA 0.2  0.0 - 1.0 mg/dL   Nitrite NEGATIVE  NEGATIVE   Leukocytes, UA NEGATIVE  NEGATIVE  DIGOXIN LEVEL      Result Value Range   Digoxin Level 0.7 (*) 0.8 - 2.0 ng/mL  POCT I-STAT TROPONIN I      Result Value Range   Troponin i, poc 0.01  0.00 - 0.08 ng/mL   Comment 3            Dg Chest 2 View  12/28/2012   *RADIOLOGY REPORT*  Clinical Data: Palpitations,  diarrhea, weakness, cough  CHEST - 2 VIEW  Comparison: 09/26/2012  Findings: Normal heart size and vascularity.  No focal pneumonia, collapse, consolidation, edema, effusion or pneumothorax.  Trachea is midline.  Prior cholecystectomy evident.  Mild degenerative changes of the spine.  Mild hyperinflation evident.  IMPRESSION: Stable chronic changes.  No superimposed acute process   Original Report Authenticated By: Judie Petit. Miles Costain, M.D.    Dg Chest 2 View  12/28/2012   *RADIOLOGY REPORT*  Clinical Data: Palpitations, diarrhea, weakness, cough  CHEST - 2 VIEW  Comparison: 09/26/2012  Findings: Normal heart size and vascularity.  No focal pneumonia, collapse, consolidation, edema, effusion or  pneumothorax.  Trachea is midline.  Prior cholecystectomy evident.  Mild degenerative changes of the spine.  Mild hyperinflation evident.  IMPRESSION: Stable chronic changes.  No superimposed acute process   Original Report Authenticated By: Judie Petit. Miles Costain, M.D.   No diagnosis found.  Date: 12/28/2012  Rate: 90  Rhythm: normal sinus rhythm  QRS Axis: normal  Intervals: normal  ST/T Wave abnormalities: nonspecific T wave changes  Conduction Disutrbances:none  Narrative Interpretation:   Old EKG Reviewed: No significant change from 09/24/2012 interpreted by me  4:15 PM requesting medications for anxiety. Patient has been on Xanax in the past. Xanax 0.25 mg ordered 6 PM patient feels much improved after treatment with intravenous saline and Xanax orally. She is alert and motor Glasgow Coma Score 15 not lightheaded on standing. She did drink water without difficulty. MDM  Clinically patient is dehydrated upon presentation. She also reports she's not been sleeping well for several nights. She requested anxiolytic. Plan prescription for Xanax,, encourage by mouth fluids. No Lasix for at least 2 days. She only takes Lasix on an as-needed basis for leg swelling She is to reschedule her appointment with her ENT doctor Diagnosis  #1dehydration #2 anxiety   Doug Sou, MD 12/28/12 1815  Doug Sou, MD 12/28/12 1816

## 2012-12-28 NOTE — ED Notes (Signed)
Family concerned pt has temperature. Pt just drinking ice water. Rectal temp was checked.

## 2012-12-28 NOTE — ED Notes (Signed)
Pt reports about a month ago her PCP put her on prednisone and she felt a lot better, pt thinks she needs more steroids to feel better.

## 2012-12-28 NOTE — ED Notes (Signed)
Pt c/o generalized weakness and palpitations from afib today; pt in NSR at present; pt sts diarrhea x several days and generalized weakness; pt sts sinus infection x 1 month

## 2012-12-29 LAB — URINE CULTURE

## 2013-01-25 ENCOUNTER — Telehealth: Payer: Self-pay | Admitting: Pulmonary Disease

## 2013-01-25 NOTE — Telephone Encounter (Signed)
I spoke with the pt and she states she has received a grant for her xolair injections but she wanted to know does she really need to restart these? She states that she has not had any "asthma issues" since being off of the injections. I advised that per last OV in May Dr. Shelle Iron felt it was still important for her to get back  On the injections. Pt again states she dos not know why since she has not had any issues. I advised I will send message to [facility] to see if he stills feel like the pt needs to restart the medication and if so we will place the order to get the process started. Pt states understanding and states she will  Do whatever [facility] recommends. Carron Curie, CMA Allergies  Allergen Reactions  . Brovana (Arformoterol) Other (See Comments)    Makes my heart race  . Budesonide Other (See Comments)    Makes my heart race  . Cephalexin Itching  . Iohexol      Desc: NOTES FROM PRIOR CT STATES PRE MEDICATION PRIOR TO OMNIPAQUE ENHANCED CT   . Penicillins Itching and Swelling    Swelling to ears  . Sulfonamide Derivatives Itching  . Tetracycline Itching  . Vancomycin Other (See Comments)    Turn red

## 2013-01-25 NOTE — Telephone Encounter (Signed)
Let her know that she has very severe asthma, and the [facility] has clearly helped her in the past.  Explain that we are also trying to prevent progressive disease. It is her decision to stay off or not.  If she wants to stay off and we can monitor her, that is ok with me as long as she understands that she may worsen. Also, once she stops and stays off xolair for a period of time, can she get the financial help to get back on if she needs?  Don't know answer to this.

## 2013-01-26 NOTE — Telephone Encounter (Signed)
Spoke with the pt and notified of recs per [facility]  She verbalized understanding  Prefers to stay off of the xolair and monitor symptoms Nothing further needed per pt

## 2013-02-01 ENCOUNTER — Other Ambulatory Visit: Payer: Self-pay | Admitting: Pulmonary Disease

## 2013-02-20 ENCOUNTER — Other Ambulatory Visit: Payer: Self-pay | Admitting: Pulmonary Disease

## 2013-03-25 ENCOUNTER — Emergency Department (HOSPITAL_COMMUNITY)
Admission: EM | Admit: 2013-03-25 | Discharge: 2013-03-25 | Disposition: A | Discharge status: Home / Self Care | Payer: Medicare Other | Attending: Emergency Medicine | Admitting: Emergency Medicine

## 2013-03-25 ENCOUNTER — Emergency Department (HOSPITAL_COMMUNITY): Payer: Medicare Other

## 2013-03-25 ENCOUNTER — Encounter (HOSPITAL_COMMUNITY): Payer: Self-pay | Admitting: *Deleted

## 2013-03-25 DIAGNOSIS — Z9861 Coronary angioplasty status: Secondary | ICD-10-CM | POA: Insufficient documentation

## 2013-03-25 DIAGNOSIS — R6889 Other general symptoms and signs: Secondary | ICD-10-CM | POA: Insufficient documentation

## 2013-03-25 DIAGNOSIS — Z7901 Long term (current) use of anticoagulants: Secondary | ICD-10-CM | POA: Insufficient documentation

## 2013-03-25 DIAGNOSIS — Z7982 Long term (current) use of aspirin: Secondary | ICD-10-CM | POA: Insufficient documentation

## 2013-03-25 DIAGNOSIS — I10 Essential (primary) hypertension: Secondary | ICD-10-CM | POA: Insufficient documentation

## 2013-03-25 DIAGNOSIS — J45909 Unspecified asthma, uncomplicated: Secondary | ICD-10-CM | POA: Insufficient documentation

## 2013-03-25 DIAGNOSIS — Z8669 Personal history of other diseases of the nervous system and sense organs: Secondary | ICD-10-CM | POA: Insufficient documentation

## 2013-03-25 DIAGNOSIS — Z87891 Personal history of nicotine dependence: Secondary | ICD-10-CM | POA: Insufficient documentation

## 2013-03-25 DIAGNOSIS — Z880 Allergy status to penicillin: Secondary | ICD-10-CM | POA: Insufficient documentation

## 2013-03-25 DIAGNOSIS — I4891 Unspecified atrial fibrillation: Secondary | ICD-10-CM | POA: Insufficient documentation

## 2013-03-25 DIAGNOSIS — R42 Dizziness and giddiness: Secondary | ICD-10-CM | POA: Insufficient documentation

## 2013-03-25 DIAGNOSIS — Z79899 Other long term (current) drug therapy: Secondary | ICD-10-CM | POA: Insufficient documentation

## 2013-03-25 DIAGNOSIS — I2510 Atherosclerotic heart disease of native coronary artery without angina pectoris: Secondary | ICD-10-CM | POA: Insufficient documentation

## 2013-03-25 DIAGNOSIS — Z8719 Personal history of other diseases of the digestive system: Secondary | ICD-10-CM | POA: Insufficient documentation

## 2013-03-25 DIAGNOSIS — R5381 Other malaise: Secondary | ICD-10-CM | POA: Insufficient documentation

## 2013-03-25 DIAGNOSIS — IMO0002 Reserved for concepts with insufficient information to code with codable children: Secondary | ICD-10-CM | POA: Insufficient documentation

## 2013-03-25 LAB — POCT I-STAT TROPONIN I: Troponin i, poc: 0 ng/mL (ref 0.00–0.08)

## 2013-03-25 LAB — CBC
HCT: 35.8 % — ABNORMAL LOW (ref 36.0–46.0)
Hemoglobin: 12.3 g/dL (ref 12.0–15.0)
MCH: 30 pg (ref 26.0–34.0)
MCHC: 34.4 g/dL (ref 30.0–36.0)
MCV: 87.3 fL (ref 78.0–100.0)
Platelets: 211 10*3/uL (ref 150–400)
RBC: 4.1 MIL/uL (ref 3.87–5.11)
RDW: 15.2 % (ref 11.5–15.5)
WBC: 10 10*3/uL (ref 4.0–10.5)

## 2013-03-25 LAB — BASIC METABOLIC PANEL
BUN: 18 mg/dL (ref 6–23)
CO2: 27 mEq/L (ref 19–32)
Calcium: 8.4 mg/dL (ref 8.4–10.5)
Chloride: 104 mEq/L (ref 96–112)
Creatinine, Ser: 0.89 mg/dL (ref 0.50–1.10)
GFR calc Af Amer: 66 mL/min — ABNORMAL LOW (ref >90)
GFR calc non Af Amer: 57 mL/min — ABNORMAL LOW (ref >90)
Glucose, Bld: 105 mg/dL — ABNORMAL HIGH (ref 70–99)
Potassium: 4.5 mEq/L (ref 3.5–5.1)
Sodium: 138 mEq/L (ref 135–145)

## 2013-03-25 LAB — PROTIME-INR
INR: 2.59 — ABNORMAL HIGH (ref 0.00–1.49)
Prothrombin Time: 26.9 seconds — ABNORMAL HIGH (ref 11.6–15.2)

## 2013-03-25 LAB — URINALYSIS, ROUTINE W REFLEX MICROSCOPIC
Bilirubin Urine: NEGATIVE
Glucose, UA: NEGATIVE mg/dL
Hgb urine dipstick: NEGATIVE
Ketones, ur: NEGATIVE mg/dL
Nitrite: NEGATIVE
Protein, ur: NEGATIVE mg/dL
Specific Gravity, Urine: 1.008 (ref 1.005–1.030)
Urobilinogen, UA: 0.2 mg/dL (ref 0.0–1.0)
pH: 6 (ref 5.0–8.0)

## 2013-03-25 LAB — URINE MICROSCOPIC-ADD ON

## 2013-03-25 LAB — DIGOXIN LEVEL: Digoxin Level: 1 ng/mL (ref 0.8–2.0)

## 2013-03-25 MED ORDER — SODIUM CHLORIDE 0.9 % IV BOLUS (SEPSIS)
500.0000 mL | Freq: Once | INTRAVENOUS | Status: AC
  Administered 2013-03-25: 500 mL via INTRAVENOUS

## 2013-03-25 NOTE — ED Notes (Signed)
The pt has not felt well all day.  She had a sudden onset of feeling weak and  Trying to pass out her face was flushed  And this episode lasteed for approx 15 minutes.  She denies pain any more than usual.  Alert  Skin warm and dry

## 2013-03-25 NOTE — ED Provider Notes (Signed)
CSN: [id number]     Arrival date & time 03/25/13  1859 History   First MD Initiated Contact with Patient 03/25/13 1913     Chief Complaint  Patient presents with  . feeling very bad    (Consider location/radiation/quality/duration/timing/severity/associated sxs/prior Treatment) HPI Comments: 79 yo female with asthma, a fib, cad presents with not feeling well all day.  Difficult to describe, fatigue and face was flushed, mild lightheaded earlier today, no syncope/ cp or sob.  Recent abx for bronchitis.  Mild cough, non productive. No new meds.  No fevers.  Sxs mild and nothing improves.  Pt had xanax PTA.  Pt has a hx of dehydration.    The history is provided by the patient.    Past Medical History  Diagnosis Date  . Asthma   . Esophageal reflux   . CAD (coronary artery disease)   . Unspecified essential hypertension   . Glaucoma   . Atrial fibrillation   . Diverticulosis    Past Surgical History  Procedure Laterality Date  . Appendectomy    . Vesicovaginal fistula closure w/ tah    . Nasal sinus surgery    . Cholecystectomy    . Coronary angioplasty with stent placement     Family History  Problem Relation Age of Onset  . Heart disease Father   . Colon cancer Mother    History  Substance Use Topics  . Smoking status: Former Smoker -- 0.20 packs/day for 25 years    Types: Cigarettes    Quit date: 07/06/1966  . Smokeless tobacco: Not on file  . Alcohol Use: No   OB History   Grav Para Term Preterm Abortions TAB SAB Ect Mult Living                 Review of Systems  Constitutional: Positive for fatigue. Negative for fever and chills.  HENT: Negative for neck pain and neck stiffness.   Eyes: Negative for visual disturbance.  Respiratory: Negative for shortness of breath.   Cardiovascular: Negative for chest pain.  Gastrointestinal: Negative for nausea, vomiting, abdominal pain and blood in stool.  Genitourinary: Negative for dysuria and flank pain.   Musculoskeletal: Negative for back pain and gait problem.  Skin: Negative for rash.  Neurological: Positive for light-headedness. Negative for syncope and headaches.    Allergies  Brovana; Budesonide; Cephalexin; Iohexol; Penicillins; Sulfonamide derivatives; Tetracycline; and Vancomycin  Home Medications   Current Outpatient Rx  Name  Route  Sig  Dispense  Refill  . acetaminophen (TYLENOL) 325 MG tablet   Oral   Take 325 mg by mouth daily as needed for pain.         [name] EXPIRED: albuterol (PROVENTIL) (2.5 MG/3ML) 0.083% nebulizer solution   Nebulization   Take 3 mLs (2.5 mg total) by nebulization every 6 (six) hours as needed for wheezing. DX:  493.90   300 mL   12   . ALPRAZolam (XANAX) 0.25 MG tablet   Oral   Take 1 tablet (0.25 mg total) by mouth 2 (two) times daily as needed for sleep or anxiety.   10 tablet   0   . amLODipine-valsartan (EXFORGE) 10-320 MG per tablet   Oral   Take 1 tablet by mouth daily.           [name] aspirin EC 81 MG tablet   Oral   Take 81 mg by mouth every other day.         . cetirizine (ZYRTEC) 10 MG  tablet   Oral   Take 10 mg by mouth daily as needed for allergies.         . COMBIVENT RESPIMAT 20-100 MCG/ACT AERS respimat      INHALE ONE PUFF INTO THE LUNGS EVERY 4 HOURS AS NEEDED FOR RESCUE   1 Inhaler   1   . digoxin (LANOXIN) 0.125 MG tablet   Oral   Take 0.125 mg by mouth daily.          . Fluticasone-Salmeterol (ADVAIR DISKUS) 500-50 MCG/DOSE AEPB   Inhalation   Inhale 1 puff into the lungs 2 (two) times daily.   60 each   2   . Fluticasone-Salmeterol (ADVAIR) 500-50 MCG/DOSE AEPB   Inhalation   Inhale 1 puff into the lungs 2 (two) times daily.         . furosemide (LASIX) 40 MG tablet   Oral   Take 40 mg by mouth daily as needed for fluid.          [name] Hypertonic Nasal Wash (SINUS RINSE BOTTLE KIT NA)      Lloyd Huger Med --- use as needed each nostril         . latanoprost (XALATAN) 0.005 % ophthalmic  solution   Both Eyes   Place 1 drop into both eyes at bedtime.           [name] levofloxacin (LEVAQUIN) 750 MG tablet   Oral   Take 1 tablet (750 mg total) by mouth daily.   5 tablet   0   . methylPREDNISolone (MEDROL) 16 MG tablet      Take 2 tablets daily x 2 days, then 1.5 x 2 days, then 1 x 2 days, then .5 x 2 days then stop.   10 tablet   0   . mometasone (NASONEX) 50 MCG/ACT nasal spray   Nasal   Place 2 sprays into the nose daily.         . simvastatin (ZOCOR) 5 MG tablet   Oral   Take 1 tablet by mouth at bedtime.          . sodium chloride (OCEAN) 0.65 % nasal spray   Nasal   Place 1 spray into the nose as needed for congestion.         [name] warfarin (COUMADIN) 5 MG tablet   Oral   Take 2.5-3.75 mg by mouth See admin instructions. Takes half tablet (2.5mg ) on Monday and Friday Takes 3/4 tablet (2.75mg ) the rest of the week          BP 151/54  Pulse 81  Temp(Src) 97.6 F (36.4 C) (Oral)  Resp 18  Wt 135 lb (61.236 kg)  BMI 21.14 kg/m2  SpO2 97% Physical Exam  Nursing note and vitals reviewed. Constitutional: She is oriented to person, place, and time. She appears well-developed and well-nourished.  HENT:  Head: Normocephalic and atraumatic.  Dry mm mild  Eyes: Conjunctivae are normal. Right eye exhibits no discharge. Left eye exhibits no discharge.  Neck: Normal range of motion. Neck supple. No tracheal deviation present.  Cardiovascular: Normal rate and regular rhythm.   Murmur (2+ SM left sb) heard. Pulmonary/Chest: Effort normal and breath sounds normal.  Abdominal: Soft. She exhibits no distension. There is no tenderness. There is no guarding.  Musculoskeletal: She exhibits no edema and no tenderness.  Neurological: She is alert and oriented to person, place, and time. No cranial nerve deficit or sensory deficit. Coordination normal. GCS eye subscore is 4. GCS  verbal subscore is 5. GCS motor subscore is 6.  5+ strength in UE and LE with f/e at  major joints. Sensation to palpation intact in UE and LE. CNs 2-12 grossly intact.  EOMFI.  PERRL.   Finger nose and coordination intact bilateral.   Visual fields intact to finger testing.   Skin: Skin is warm. No rash noted.  Psychiatric: She has a normal mood and affect.    ED Course  Procedures (including critical care time) Labs Review Labs Reviewed  CBC - Abnormal; Notable for the following:    HCT 35.8 (*)    All other components within normal limits  BASIC METABOLIC PANEL - Abnormal; Notable for the following:    Glucose, Bld 105 (*)    GFR calc non Af Amer 57 (*)    GFR calc Af Amer 66 (*)    All other components within normal limits  URINALYSIS, ROUTINE W REFLEX MICROSCOPIC - Abnormal; Notable for the following:    APPearance CLOUDY (*)    Leukocytes, UA LARGE (*)    All other components within normal limits  PROTIME-INR - Abnormal; Notable for the following:    Prothrombin Time 26.9 (*)    INR 2.59 (*)    All other components within normal limits  URINE MICROSCOPIC-ADD ON - Abnormal; Notable for the following:    Squamous Epithelial / LPF FEW (*)    All other components within normal limits  URINE CULTURE  DIGOXIN LEVEL  HEPATIC FUNCTION PANEL  LIPID PANEL  POCT I-STAT TROPONIN I   Imaging Review No results found.  MDM  No diagnosis found. Pt sxs improved in ED. Vague sxs, screening labs, CXR and UA, no acute findings. Possible mild UTI, pt just finished abx, offered po abx, pt prefers to fup culture with pcp in addition to Dig level and recheck.   Date: 03/25/2013  Rate: 83  Rhythm: normal sinus rhythm  QRS Axis: normal  Intervals: PR prolonged  ST/T Wave abnormalities: nonspecific ST changes  Conduction Disutrbances:first-degree A-V block   Narrative Interpretation:   Old EKG Reviewed: unchanged   Pt requesting to go home and see pcp, comfortable with plan. Fluids given in ED.   Dehydration, ?UTI, Feeling unwell    Enid Skeens,  MD 03/25/13 2221

## 2013-03-28 LAB — URINE CULTURE: Culture: NO GROWTH

## 2013-04-18 ENCOUNTER — Emergency Department (HOSPITAL_COMMUNITY): Payer: Medicare Other

## 2013-04-18 ENCOUNTER — Encounter (HOSPITAL_COMMUNITY): Payer: Self-pay | Admitting: Emergency Medicine

## 2013-04-18 ENCOUNTER — Emergency Department (HOSPITAL_COMMUNITY)
Admission: EM | Admit: 2013-04-18 | Discharge: 2013-04-18 | Disposition: A | Discharge status: Home / Self Care | Payer: Medicare Other | Attending: Emergency Medicine | Admitting: Emergency Medicine

## 2013-04-18 DIAGNOSIS — Z79899 Other long term (current) drug therapy: Secondary | ICD-10-CM | POA: Insufficient documentation

## 2013-04-18 DIAGNOSIS — I10 Essential (primary) hypertension: Secondary | ICD-10-CM | POA: Insufficient documentation

## 2013-04-18 DIAGNOSIS — J45909 Unspecified asthma, uncomplicated: Secondary | ICD-10-CM | POA: Insufficient documentation

## 2013-04-18 DIAGNOSIS — Z880 Allergy status to penicillin: Secondary | ICD-10-CM | POA: Insufficient documentation

## 2013-04-18 DIAGNOSIS — Z7982 Long term (current) use of aspirin: Secondary | ICD-10-CM | POA: Insufficient documentation

## 2013-04-18 DIAGNOSIS — K219 Gastro-esophageal reflux disease without esophagitis: Secondary | ICD-10-CM | POA: Insufficient documentation

## 2013-04-18 DIAGNOSIS — H409 Unspecified glaucoma: Secondary | ICD-10-CM | POA: Insufficient documentation

## 2013-04-18 DIAGNOSIS — I2510 Atherosclerotic heart disease of native coronary artery without angina pectoris: Secondary | ICD-10-CM | POA: Insufficient documentation

## 2013-04-18 DIAGNOSIS — I4891 Unspecified atrial fibrillation: Secondary | ICD-10-CM | POA: Insufficient documentation

## 2013-04-18 DIAGNOSIS — Z87891 Personal history of nicotine dependence: Secondary | ICD-10-CM | POA: Insufficient documentation

## 2013-04-18 DIAGNOSIS — Z7901 Long term (current) use of anticoagulants: Secondary | ICD-10-CM | POA: Insufficient documentation

## 2013-04-18 DIAGNOSIS — R109 Unspecified abdominal pain: Secondary | ICD-10-CM | POA: Insufficient documentation

## 2013-04-18 LAB — URINALYSIS, ROUTINE W REFLEX MICROSCOPIC
Bilirubin Urine: NEGATIVE
Glucose, UA: NEGATIVE mg/dL
Hgb urine dipstick: NEGATIVE
Ketones, ur: NEGATIVE mg/dL
Nitrite: NEGATIVE
Protein, ur: NEGATIVE mg/dL
Specific Gravity, Urine: 1.011 (ref 1.005–1.030)
Urobilinogen, UA: 0.2 mg/dL (ref 0.0–1.0)
pH: 6 (ref 5.0–8.0)

## 2013-04-18 LAB — COMPREHENSIVE METABOLIC PANEL
ALT: 14 U/L (ref 0–35)
AST: 20 U/L (ref 0–37)
Albumin: 3.7 g/dL (ref 3.5–5.2)
Alkaline Phosphatase: 53 U/L (ref 39–117)
BUN: 10 mg/dL (ref 6–23)
CO2: 24 mEq/L (ref 19–32)
Calcium: 9.5 mg/dL (ref 8.4–10.5)
Chloride: 101 mEq/L (ref 96–112)
Creatinine, Ser: 0.83 mg/dL (ref 0.50–1.10)
GFR calc Af Amer: 71 mL/min — ABNORMAL LOW (ref >90)
GFR calc non Af Amer: 62 mL/min — ABNORMAL LOW (ref >90)
Glucose, Bld: 117 mg/dL — ABNORMAL HIGH (ref 70–99)
Potassium: 4 mEq/L (ref 3.5–5.1)
Sodium: 136 mEq/L (ref 135–145)
Total Bilirubin: 0.4 mg/dL (ref 0.3–1.2)
Total Protein: 7.2 g/dL (ref 6.0–8.3)

## 2013-04-18 LAB — CBC WITH DIFFERENTIAL/PLATELET
Basophils Absolute: 0 10*3/uL (ref 0.0–0.1)
Basophils Relative: 0 % (ref 0–1)
Eosinophils Absolute: 0.2 10*3/uL (ref 0.0–0.7)
Eosinophils Relative: 2 % (ref 0–5)
HCT: 37.4 % (ref 36.0–46.0)
Hemoglobin: 12.6 g/dL (ref 12.0–15.0)
Lymphocytes Relative: 19 % (ref 12–46)
Lymphs Abs: 1.8 10*3/uL (ref 0.7–4.0)
MCH: 29.5 pg (ref 26.0–34.0)
MCHC: 33.7 g/dL (ref 30.0–36.0)
MCV: 87.6 fL (ref 78.0–100.0)
Monocytes Absolute: 0.7 10*3/uL (ref 0.1–1.0)
Monocytes Relative: 7 % (ref 3–12)
Neutro Abs: 7.1 10*3/uL (ref 1.7–7.7)
Neutrophils Relative %: 73 % (ref 43–77)
Platelets: 225 10*3/uL (ref 150–400)
RBC: 4.27 MIL/uL (ref 3.87–5.11)
RDW: 15.5 % (ref 11.5–15.5)
WBC: 9.8 10*3/uL (ref 4.0–10.5)

## 2013-04-18 LAB — URINE MICROSCOPIC-ADD ON

## 2013-04-18 MED ORDER — LORAZEPAM 2 MG/ML IJ SOLN
0.5000 mg | Freq: Once | INTRAMUSCULAR | Status: AC
  Administered 2013-04-18: 0.5 mg via INTRAVENOUS
  Filled 2013-04-18: qty 1

## 2013-04-18 MED ORDER — ONDANSETRON HCL 4 MG/2ML IJ SOLN
4.0000 mg | Freq: Once | INTRAMUSCULAR | Status: AC
  Administered 2013-04-18: 4 mg via INTRAVENOUS
  Filled 2013-04-18: qty 2

## 2013-04-18 MED ORDER — TRAMADOL HCL 50 MG PO TABS: 50.0000 mg | ORAL_TABLET | Freq: Four times a day (QID) | ORAL | Status: DC | PRN

## 2013-04-18 MED ORDER — MORPHINE SULFATE 4 MG/ML IJ SOLN
4.0000 mg | Freq: Once | INTRAMUSCULAR | Status: DC
  Filled 2013-04-18: qty 1

## 2013-04-18 MED ORDER — SODIUM CHLORIDE 0.9 % IV BOLUS (SEPSIS)
500.0000 mL | Freq: Once | INTRAVENOUS | Status: AC
  Administered 2013-04-18: 500 mL via INTRAVENOUS

## 2013-04-18 NOTE — ED Notes (Signed)
Per pt, was treated for cystitis last week-woke up this am with RLQ pain

## 2013-04-25 ENCOUNTER — Ambulatory Visit: Payer: Medicare Other

## 2013-04-25 ENCOUNTER — Ambulatory Visit (INDEPENDENT_AMBULATORY_CARE_PROVIDER_SITE_OTHER): Payer: Medicare Other

## 2013-04-25 DIAGNOSIS — Z23 Encounter for immunization: Secondary | ICD-10-CM

## 2013-04-25 NOTE — ED Provider Notes (Signed)
CSN: [id number]     Arrival date & time 04/18/13  1308 History   First MD Initiated Contact with Patient 04/18/13 1513     Chief Complaint  Patient presents with  . RLQ pain    (Consider location/radiation/quality/duration/timing/severity/associated sxs/prior Treatment) HPI  79 year old female with abdominal pain. Patient was recently treated for cystitis. She cannot remember what specifically she was taking them. Symptoms did improve until this morning. Since then, she's been having pain in her lower abdomen. Has been relatively constant and without any appreciable exacerbating or relieving factors. The goal, deep ache. No urinary complaints. No nausea, vomiting or diarrhea. No fevers or chills. Surgical history significant for cholecystectomy and hysterectomy. She thinks her appendix may have been taken out with one of these procedures, but she's not sure.  Past Medical History  Diagnosis Date  . Asthma   . Esophageal reflux   . CAD (coronary artery disease)   . Unspecified essential hypertension   . Glaucoma   . Atrial fibrillation   . Diverticulosis    Past Surgical History  Procedure Laterality Date  . Appendectomy    . Vesicovaginal fistula closure w/ tah    . Nasal sinus surgery    . Cholecystectomy    . Coronary angioplasty with stent placement     Family History  Problem Relation Age of Onset  . Heart disease Father   . Colon cancer Mother    History  Substance Use Topics  . Smoking status: Former Smoker -- 0.20 packs/day for 25 years    Types: Cigarettes    Quit date: 07/06/1966  . Smokeless tobacco: Not on file  . Alcohol Use: No   OB History   Grav Para Term Preterm Abortions TAB SAB Ect Mult Living                 Review of Systems  All systems reviewed and negative, other than as noted in HPI.   Allergies  Brovana; Budesonide; Cephalexin; Iohexol; Penicillins; Sulfonamide derivatives; Tetracycline; and Vancomycin  Home Medications   Current  Outpatient Rx  Name  Route  Sig  Dispense  Refill  . acetaminophen (TYLENOL) 325 MG tablet   Oral   Take 325 mg by mouth every 6 (six) hours as needed for pain.          [name] ALPRAZolam (XANAX) 0.25 MG tablet   Oral   Take 1 tablet (0.25 mg total) by mouth 2 (two) times daily as needed for sleep or anxiety.   10 tablet   0   . amLODipine-valsartan (EXFORGE) 10-320 MG per tablet   Oral   Take 1 tablet by mouth daily.           [name] aspirin EC 81 MG tablet   Oral   Take 81 mg by mouth every other day.         . cetirizine (ZYRTEC) 10 MG tablet   Oral   Take 10 mg by mouth daily as needed for allergies.         [name] digoxin (LANOXIN) 0.125 MG tablet   Oral   Take 0.125 mg by mouth daily.          . Fluticasone-Salmeterol (ADVAIR DISKUS) 500-50 MCG/DOSE AEPB   Inhalation   Inhale 1 puff into the lungs 2 (two) times daily.   60 each   2   . furosemide (LASIX) 40 MG tablet   Oral   Take 40 mg by mouth daily as needed for fluid.          [name]  Ipratropium-Albuterol (COMBIVENT RESPIMAT) 20-100 MCG/ACT AERS respimat   Inhalation   Inhale 1 puff into the lungs every 4 (four) hours as needed for wheezing or shortness of breath.         . latanoprost (XALATAN) 0.005 % ophthalmic solution   Both Eyes   Place 1 drop into both eyes at bedtime.           . mometasone (NASONEX) 50 MCG/ACT nasal spray   Nasal   Place 2 sprays into the nose at bedtime.          . simvastatin (ZOCOR) 5 MG tablet   Oral   Take 1 tablet by mouth at bedtime.          . sodium chloride (OCEAN) 0.65 % nasal spray   Nasal   Place 1 spray into the nose as needed for congestion.         [name] warfarin (COUMADIN) 5 MG tablet   Oral   Take 2.5-3.75 mg by mouth See admin instructions. Takes half tablet (2.5mg ) on Monday and Friday Takes 3/4 tablet (2.75mg ) the rest of the week         . traMADol (ULTRAM) 50 MG tablet   Oral   Take 1 tablet (50 mg total) by mouth every 6 (six) hours as needed  for pain.   15 tablet   0    BP 124/57  Pulse 95  Temp(Src) 98.6 F (37 C) (Oral)  Resp 16  SpO2 94% Physical Exam  Nursing note and vitals reviewed. Constitutional: She appears well-developed and well-nourished. No distress.  Laying in bed. No acute distress.  HENT:  Head: Normocephalic and atraumatic.  Eyes: Conjunctivae are normal. Right eye exhibits no discharge. Left eye exhibits no discharge.  Neck: Neck supple.  Cardiovascular: Normal rate, regular rhythm and normal heart sounds.  Exam reveals no gallop and no friction rub.   No murmur heard. Pulmonary/Chest: Effort normal and breath sounds normal. No respiratory distress.  Abdominal: Soft. She exhibits no distension. There is tenderness.  Mild tenderness suprapubically and  in the left lower quadrant without rebound or guarding.  Genitourinary:  No cva tenderness  Musculoskeletal: She exhibits no edema and no tenderness.  Neurological: She is alert.  Skin: Skin is warm and dry.  Psychiatric: She has a normal mood and affect. Her behavior is normal. Thought content normal.    ED Course  Procedures (including critical care time) Labs Review Labs Reviewed  COMPREHENSIVE METABOLIC PANEL - Abnormal; Notable for the following:    Glucose, Bld 117 (*)    GFR calc non Af Amer 62 (*)    GFR calc Af Amer 71 (*)    All other components within normal limits  URINALYSIS, ROUTINE W REFLEX MICROSCOPIC - Abnormal; Notable for the following:    APPearance CLOUDY (*)    Leukocytes, UA TRACE (*)    All other components within normal limits  CBC WITH DIFFERENTIAL  URINE MICROSCOPIC-ADD ON   Imaging Review No results found.  EKG Interpretation   None       MDM   1. Abdominal pain    79 year old female with abdominal pain. Etiology is not completely clear at this time, but does not appear to be an emergent process. Urinalysis is not acutely suggestive of infection. Blood work is unremarkable. CT abdomen pelvis is  unremarkable as well. Patient does not have peritonitis. She appears relatively comfortable. I feel she is safe for discharge. Needs outpatient followup for further evaluation  for symptoms persist. Return precautions discussed.    Raeford Razor, MD 04/25/13 1432

## 2013-05-11 ENCOUNTER — Ambulatory Visit (INDEPENDENT_AMBULATORY_CARE_PROVIDER_SITE_OTHER): Payer: Medicare Other | Admitting: Pulmonary Disease

## 2013-05-11 ENCOUNTER — Encounter: Payer: Self-pay | Admitting: Pulmonary Disease

## 2013-05-11 VITALS — BP 130/58 | HR 83 | Temp 97.5°F | Ht 67.0 in | Wt 145.8 lb

## 2013-05-11 DIAGNOSIS — J449 Chronic obstructive pulmonary disease, unspecified: Secondary | ICD-10-CM

## 2013-05-11 MED ORDER — FLUTICASONE-SALMETEROL 250-50 MCG/DOSE IN AEPB: 1.0000 | INHALATION_SPRAY | Freq: Two times a day (BID) | RESPIRATORY_TRACT | Status: DC

## 2013-05-11 MED ORDER — FLUTICASONE PROPIONATE 50 MCG/ACT NA SUSP: 2.0000 | Freq: Every day | NASAL | Status: DC

## 2013-05-11 MED ORDER — ALBUTEROL SULFATE HFA 108 (90 BASE) MCG/ACT IN AERS: 1.0000 | INHALATION_SPRAY | RESPIRATORY_TRACT | Status: DC | PRN

## 2013-05-11 MED ORDER — MONTELUKAST SODIUM 10 MG PO TABS: 10.0000 mg | ORAL_TABLET | Freq: Every day | ORAL | Status: DC

## 2013-05-11 NOTE — Addendum Note (Signed)
Addended by: Maisie Fus on: 05/11/2013 12:03 PM   Modules accepted: Orders, Medications

## 2013-05-11 NOTE — Patient Instructions (Signed)
Will change combivent to albuterol to use as needed. Change nasonex to flonase, and will give you a prescription Ok to decrease advair to 250/50 strength, but need to go back to 500/50 if increased symptoms Will start on singulair in light of your worsening nasal polyps. Please ask your cardiologist about coming off aspirin in light of your asthma and nasal polyps. followup with me in 6mos.

## 2013-05-11 NOTE — Progress Notes (Signed)
  Subjective:    Patient ID: [name], female    DOB: [birth year], 79 y.o.   MRN: [id number]  HPI The patient comes in today for followup of her known chronic obstructive asthma.  She has done very well since last visit despite being off Xolair.  She has recently seen in otolaryngology, and told that her polyps were larger.  She would like to avoid surgery if at all possible.  She is having some increased dysphonia from the Advair, and would like to try a lower dose.  She denies any recent acute exacerbation, and feels that her asthma is at baseline currently.   Review of Systems  Constitutional: Negative for fever and unexpected weight change.  HENT: Positive for congestion and sneezing. Negative for dental problem, ear pain, mouth sores, nosebleeds, postnasal drip, rhinorrhea, sinus pressure, sore throat and trouble swallowing.   Eyes: Negative for redness and itching.  Respiratory: Negative for cough, chest tightness, shortness of breath and wheezing.   Cardiovascular: Negative for palpitations and leg swelling.  Gastrointestinal: Negative for nausea and vomiting.  Genitourinary: Negative for dysuria.  Musculoskeletal: Negative for joint swelling.  Skin: Negative for rash.  Neurological: Negative for headaches.  Hematological: Does not bruise/bleed easily.  Psychiatric/Behavioral: Negative for dysphoric mood. The patient is not nervous/anxious.        Objective:   Physical Exam Well-developed female in no acute distress Nose without purulence or discharge noted Neck without lymphadenopathy or thyromegaly Chest with decreased breath sounds, but no wheezing or crackles. Cardiac exam with regular rate and rhythm, 2/6 systolic murmur Lower extremities with 1+ ankle edema. Alert and oriented, moves all 4 extremities.       Assessment & Plan:

## 2013-05-11 NOTE — Assessment & Plan Note (Signed)
The patient has been doing well from an asthma standpoint, despite being off Xolair.  She has been found to have nasal polyps, and otolaryngology has suggested surgery.  Is already on a nasal steroid, and we'll add Singulair as a trial to see if we can treat her polyps down.  I also asked her to talk with her cardiologist about coming off aspirin.  The patient is asking about decreasing her Advair dose because of dysphonia, and I am okay with this provided she stays controlled.

## 2013-05-23 ENCOUNTER — Telehealth: Payer: Self-pay | Admitting: Cardiology

## 2013-05-23 NOTE — Telephone Encounter (Signed)
New Problem  Pt called having irregular heart beats that normally go away. She states that they have been persistant since 05/21/2013//Made appt for 11/19 @ 10:15am w/ Dr. Mayford Knife please call if needed.

## 2013-05-23 NOTE — Telephone Encounter (Signed)
Pt states has been having an irregular heart beat sense this past Sunday. Pt denies  any no other symptoms. Pt states she does not feel bad is jut that irregular heart is there. Pt is okay with appointment with Dr. Mayford Knife tomorrow 05/23/13 pt is aware that she can call the office if she need too. Pt verbalized understanding.

## 2013-05-24 ENCOUNTER — Ambulatory Visit (INDEPENDENT_AMBULATORY_CARE_PROVIDER_SITE_OTHER): Payer: Medicare Other | Admitting: Cardiology

## 2013-05-24 ENCOUNTER — Encounter: Payer: Self-pay | Admitting: Cardiology

## 2013-05-24 VITALS — BP 138/54 | HR 65 | Ht 67.0 in | Wt 141.0 lb

## 2013-05-24 DIAGNOSIS — I2510 Atherosclerotic heart disease of native coronary artery without angina pectoris: Secondary | ICD-10-CM

## 2013-05-24 DIAGNOSIS — I480 Paroxysmal atrial fibrillation: Secondary | ICD-10-CM

## 2013-05-24 DIAGNOSIS — I4819 Other persistent atrial fibrillation: Secondary | ICD-10-CM | POA: Insufficient documentation

## 2013-05-24 DIAGNOSIS — Z7901 Long term (current) use of anticoagulants: Secondary | ICD-10-CM

## 2013-05-24 DIAGNOSIS — E785 Hyperlipidemia, unspecified: Secondary | ICD-10-CM

## 2013-05-24 DIAGNOSIS — I4891 Unspecified atrial fibrillation: Secondary | ICD-10-CM

## 2013-05-24 NOTE — Patient Instructions (Signed)
Your physician has recommended you make the following change in your medication: Stop Aspirin 81 Mg Daily  Your physician recommends that you return for lab work tomorrow for you Fasting Lipid and Hepatic Panel. Also for a Digoxin level will be done tomorrow.  Your physician has recommended that you wear an event monitor. Event monitors are medical devices that record the heart's electrical activity. Doctors most often [place] these monitors to diagnose arrhythmias. Arrhythmias are problems with the speed or rhythm of the heartbeat. The monitor is a small, portable device. You can wear one while you do your normal daily activities. This is usually used to diagnose what is causing palpitations/syncope (passing out).  Your physician wants you to follow-up in: 6 Months with Dr. Sherlyn Lick will receive a reminder letter in the mail two months in advance. If you don't receive a letter, please call our office to schedule the follow-up appointment.

## 2013-05-24 NOTE — Progress Notes (Addendum)
[address] [place], [place]  [address] Phone: [phone] Fax:  [phone]  Date:  05/25/2013   ID:  [name], DOB October 17, 1923, MRN [id number]  PCP:  Herb Grays, MD  Cardiologist:  Armanda Magic, MD     History of Present Illness: [name] is a 79 y.o. female with a history of ASCAD/PAF/HTN/dyslipidemia who presents today for followup.  She is doing well.  She denies any chest pain, LE edema, dizziness or syncope.  She has not had any palpitations until this past Sunday which has persisted.  She has chronic SOB due to asthma which is stable.   Wt Readings from Last 3 Encounters:  05/24/13 141 lb (63.957 kg)  05/11/13 145 lb 12.8 oz (66.134 kg)  03/25/13 135 lb (61.236 kg)     Past Medical History  Diagnosis Date  . Esophageal reflux   . Glaucoma   . Diverticulosis   . PAF (paroxysmal atrial fibrillation)     intolerant to beta blockers due to asthma and intolerant to CCB due to rash  . HTN (hypertension), benign   . Asthma     copd  . CAD (coronary artery disease) 2007    s/p PCI of diagonal  . Hyperlipidemia   . Stroke 02/2011    TIA    Current Outpatient Prescriptions  Medication Sig Dispense Refill  . acetaminophen (TYLENOL) 325 MG tablet Take 325 mg by mouth every 6 (six) hours as needed for pain.       [name] albuterol (PROVENTIL HFA;VENTOLIN HFA) 108 (90 BASE) MCG/ACT inhaler Inhale 1 puff into the lungs every 4 (four) hours as needed for wheezing or shortness of breath.  1 Inhaler  6  . amLODipine-valsartan (EXFORGE) 10-320 MG per tablet Take 1 tablet by mouth daily.        . cetirizine (ZYRTEC) 10 MG tablet Take 10 mg by mouth daily as needed for allergies.      [name] digoxin (LANOXIN) 0.125 MG tablet Take 0.125 mg by mouth daily.       . fluticasone (FLONASE) 50 MCG/ACT nasal spray Place 2 sprays into both nostrils at bedtime.  16 g  11  . Fluticasone-Salmeterol (ADVAIR DISKUS) 250-50 MCG/DOSE AEPB Inhale 1 puff into the lungs  2 (two) times daily.  60 each  11  . Fluticasone-Salmeterol (ADVAIR DISKUS) 500-50 MCG/DOSE AEPB Inhale 1 puff into the lungs 2 (two) times daily.  60 each  2  . furosemide (LASIX) 40 MG tablet Take 40 mg by mouth daily as needed for fluid.       . Ipratropium-Albuterol (COMBIVENT RESPIMAT) 20-100 MCG/ACT AERS respimat Inhale 1 puff into the lungs every 4 (four) hours as needed for wheezing or shortness of breath.      . latanoprost (XALATAN) 0.005 % ophthalmic solution Place 1 drop into both eyes at bedtime.        . simvastatin (ZOCOR) 5 MG tablet Take 1 tablet by mouth at bedtime.       . sodium chloride (OCEAN) 0.65 % nasal spray Place 1 spray into the nose as needed for congestion.      [name] warfarin (COUMADIN) 5 MG tablet Take 2.5-3.75 mg by mouth See admin instructions. Takes half tablet (2.5mg ) on Monday and Friday Takes 3/4 tablet (2.75mg ) the rest of the week       No current facility-administered medications for this visit.    Allergies:    Allergies  Allergen Reactions  . Brovana [Arformoterol]  Other (See Comments)    Makes my heart race  . Budesonide Other (See Comments)    Makes my heart race  . Cephalexin Itching  . Clonidine Derivatives   . Iohexol      Desc: NOTES FROM PRIOR CT STATES PRE MEDICATION PRIOR TO OMNIPAQUE ENHANCED CT   . Penicillins Itching and Swelling    Swelling to ears  . Sulfonamide Derivatives Itching  . Tetracycline Itching  . Vancomycin Other (See Comments)    Turn red  . Cardizem [Diltiazem Hcl] Rash    Social History:  The patient  reports that she quit smoking about 46 years ago. Her smoking use included Cigarettes. She has a 5 pack-year smoking history. She does not have any smokeless tobacco history on file. She reports that she does not drink alcohol or use illicit drugs.   Family History:  The patient's family history includes Colon cancer in her mother; Heart attack in her brother; Heart disease in her brother and father; Hemachromatosis  in her father; Lymphoma in her sister; Peripheral vascular disease in her mother and sister.   ROS:  Please see the history of present illness.      All other systems reviewed and negative.   PHYSICAL EXAM: VS:  BP 138/54  Pulse 65  Ht 5\' 7"  (1.702 m)  Wt 141 lb (63.957 kg)  BMI 22.08 kg/m2 Well nourished, well developed, in no acute distress HEENT: normal Neck: no JVD Cardiac:  normal S1, S2; RRR; no murmur Lungs:  clear to auscultation bilaterally, no wheezing, rhonchi or rales Abd: soft, nontender, no hepatomegaly Ext: no edema Skin: warm and dry Neuro:  CNs 2-12 intact, no focal abnormalities noted  EKG:       ASSESSMENT AND PLAN:  1. ASCAD with no angina  - stop ASA since she is on warfarin 2. HTN - controlled  - continue amlodipine/Valsartan 3. Dyslipidemia - at goal  - recheck fasting lipids/ALT 4. PAF maintaining NSR by EKG today but with increased palpitations  - continue Warfarin and lanoxin  - check dig level  - event monitor to see if she is having breakthrough PAF  Followup with me in 6 months  Signed, Armanda Magic, MD 05/25/2013 10:02 PM

## 2013-05-25 ENCOUNTER — Other Ambulatory Visit (INDEPENDENT_AMBULATORY_CARE_PROVIDER_SITE_OTHER): Payer: Medicare Other

## 2013-05-25 DIAGNOSIS — E785 Hyperlipidemia, unspecified: Secondary | ICD-10-CM

## 2013-05-25 DIAGNOSIS — I480 Paroxysmal atrial fibrillation: Secondary | ICD-10-CM

## 2013-05-25 LAB — LIPID PANEL
Total CHOL/HDL Ratio: 3
VLDL: 20.4 mg/dL (ref 0.0–40.0)

## 2013-05-25 LAB — HEPATIC FUNCTION PANEL
ALT: 21 U/L (ref 0–35)
AST: 20 U/L (ref 0–37)
Alkaline Phosphatase: 42 U/L (ref 39–117)
Bilirubin, Direct: 0.2 mg/dL (ref 0.0–0.3)
Total Bilirubin: 0.9 mg/dL (ref 0.3–1.2)

## 2013-05-26 ENCOUNTER — Encounter: Payer: Self-pay | Admitting: General Surgery

## 2013-05-26 ENCOUNTER — Other Ambulatory Visit: Payer: Self-pay | Admitting: General Surgery

## 2013-05-26 DIAGNOSIS — Z79899 Other long term (current) drug therapy: Secondary | ICD-10-CM

## 2013-05-26 DIAGNOSIS — E785 Hyperlipidemia, unspecified: Secondary | ICD-10-CM

## 2013-05-31 ENCOUNTER — Encounter (HOSPITAL_COMMUNITY): Payer: Self-pay | Admitting: Emergency Medicine

## 2013-05-31 ENCOUNTER — Emergency Department (HOSPITAL_COMMUNITY)
Admission: EM | Admit: 2013-05-31 | Discharge: 2013-05-31 | Disposition: A | Discharge status: Home / Self Care | Payer: Medicare Other | Attending: Emergency Medicine | Admitting: Emergency Medicine

## 2013-05-31 ENCOUNTER — Emergency Department (HOSPITAL_COMMUNITY): Payer: Medicare Other

## 2013-05-31 DIAGNOSIS — Z9861 Coronary angioplasty status: Secondary | ICD-10-CM | POA: Insufficient documentation

## 2013-05-31 DIAGNOSIS — J4489 Other specified chronic obstructive pulmonary disease: Secondary | ICD-10-CM | POA: Insufficient documentation

## 2013-05-31 DIAGNOSIS — M1990 Unspecified osteoarthritis, unspecified site: Secondary | ICD-10-CM

## 2013-05-31 DIAGNOSIS — J449 Chronic obstructive pulmonary disease, unspecified: Secondary | ICD-10-CM | POA: Insufficient documentation

## 2013-05-31 DIAGNOSIS — I4891 Unspecified atrial fibrillation: Secondary | ICD-10-CM | POA: Insufficient documentation

## 2013-05-31 DIAGNOSIS — Z880 Allergy status to penicillin: Secondary | ICD-10-CM | POA: Insufficient documentation

## 2013-05-31 DIAGNOSIS — M25551 Pain in right hip: Secondary | ICD-10-CM

## 2013-05-31 DIAGNOSIS — S79919A Unspecified injury of unspecified hip, initial encounter: Secondary | ICD-10-CM | POA: Insufficient documentation

## 2013-05-31 DIAGNOSIS — W19XXXA Unspecified fall, initial encounter: Secondary | ICD-10-CM | POA: Insufficient documentation

## 2013-05-31 DIAGNOSIS — Z8673 Personal history of transient ischemic attack (TIA), and cerebral infarction without residual deficits: Secondary | ICD-10-CM | POA: Insufficient documentation

## 2013-05-31 DIAGNOSIS — E785 Hyperlipidemia, unspecified: Secondary | ICD-10-CM | POA: Insufficient documentation

## 2013-05-31 DIAGNOSIS — Y939 Activity, unspecified: Secondary | ICD-10-CM | POA: Insufficient documentation

## 2013-05-31 DIAGNOSIS — Z79899 Other long term (current) drug therapy: Secondary | ICD-10-CM | POA: Insufficient documentation

## 2013-05-31 DIAGNOSIS — Z87891 Personal history of nicotine dependence: Secondary | ICD-10-CM | POA: Insufficient documentation

## 2013-05-31 DIAGNOSIS — Z8669 Personal history of other diseases of the nervous system and sense organs: Secondary | ICD-10-CM | POA: Insufficient documentation

## 2013-05-31 DIAGNOSIS — Z7901 Long term (current) use of anticoagulants: Secondary | ICD-10-CM | POA: Insufficient documentation

## 2013-05-31 DIAGNOSIS — Y929 Unspecified place or not applicable: Secondary | ICD-10-CM | POA: Insufficient documentation

## 2013-05-31 DIAGNOSIS — M129 Arthropathy, unspecified: Secondary | ICD-10-CM | POA: Insufficient documentation

## 2013-05-31 DIAGNOSIS — Z8719 Personal history of other diseases of the digestive system: Secondary | ICD-10-CM | POA: Insufficient documentation

## 2013-05-31 DIAGNOSIS — IMO0002 Reserved for concepts with insufficient information to code with codable children: Secondary | ICD-10-CM | POA: Insufficient documentation

## 2013-05-31 DIAGNOSIS — I2510 Atherosclerotic heart disease of native coronary artery without angina pectoris: Secondary | ICD-10-CM | POA: Insufficient documentation

## 2013-05-31 DIAGNOSIS — I10 Essential (primary) hypertension: Secondary | ICD-10-CM | POA: Insufficient documentation

## 2013-05-31 MED ORDER — HYDROCODONE-ACETAMINOPHEN 5-325 MG PO TABS: 1.0000 | ORAL_TABLET | ORAL | Status: DC | PRN

## 2013-05-31 MED ORDER — ONDANSETRON HCL 4 MG/2ML IJ SOLN
4.0000 mg | Freq: Once | INTRAMUSCULAR | Status: AC
  Administered 2013-05-31: 4 mg via INTRAVENOUS
  Filled 2013-05-31: qty 2

## 2013-05-31 MED ORDER — MORPHINE SULFATE 4 MG/ML IJ SOLN
4.0000 mg | Freq: Once | INTRAMUSCULAR | Status: AC
  Administered 2013-05-31: 4 mg via INTRAVENOUS
  Filled 2013-05-31: qty 1

## 2013-05-31 MED ORDER — OXYCODONE-ACETAMINOPHEN 5-325 MG PO TABS: 1.0000 | ORAL_TABLET | ORAL | Status: DC | PRN

## 2013-05-31 NOTE — ED Notes (Signed)
MD at bedside. 

## 2013-05-31 NOTE — ED Notes (Signed)
Pt ambulate in room, able to bear weight, reports slight pain 2/10. Pt states would feel more comfortable with a walker.

## 2013-05-31 NOTE — ED Notes (Signed)
Patient transported to X-ray 

## 2013-05-31 NOTE — ED Provider Notes (Signed)
CSN: [id number]     Arrival date & time 05/31/13  1128 History   First MD Initiated Contact with Patient 05/31/13 1140     Chief Complaint  Patient presents with  . Fall  . Back Pain    HPI  patient reports falling approximately one week ago and injuring her right hip.  She's been ambulatory since the fall however she reports worsening right hip pain today.  She has not tried any pain medication.  She denies abdominal pain.  She denies any other complaints or pain.  No fevers or chills.  No unilateral leg swelling   Past Medical History  Diagnosis Date  . Esophageal reflux   . Glaucoma   . Diverticulosis   . PAF (paroxysmal atrial fibrillation)     intolerant to beta blockers due to asthma and intolerant to CCB due to rash  . HTN (hypertension), benign   . Asthma     copd  . CAD (coronary artery disease) 2007    s/p PCI of diagonal  . Hyperlipidemia   . Stroke 02/2011    TIA   Past Surgical History  Procedure Laterality Date  . Appendectomy    . Vesicovaginal fistula closure w/ tah    . Nasal sinus surgery    . Cholecystectomy    . Coronary angioplasty with stent placement     Family History  Problem Relation Age of Onset  . Heart disease Father   . Hemachromatosis Father   . Colon cancer Mother   . Peripheral vascular disease Mother   . Peripheral vascular disease Sister   . Lymphoma Sister   . Heart attack Brother   . Heart disease Brother    History  Substance Use Topics  . Smoking status: Former Smoker -- 0.20 packs/day for 25 years    Types: Cigarettes    Quit date: 07/06/1966  . Smokeless tobacco: Not on file  . Alcohol Use: No   OB History   Grav Para Term Preterm Abortions TAB SAB Ect Mult Living                 Review of Systems  All other systems reviewed and are negative.    Allergies  Brovana; Budesonide; Cephalexin; Clonidine derivatives; Iohexol; Penicillins; Sulfonamide derivatives; Tetracycline; Vancomycin; and Cardizem  Home  Medications   Current Outpatient Rx  Name  Route  Sig  Dispense  Refill  . acetaminophen (TYLENOL) 325 MG tablet   Oral   Take 325 mg by mouth every 6 (six) hours as needed for pain.          [name] albuterol (PROVENTIL HFA;VENTOLIN HFA) 108 (90 BASE) MCG/ACT inhaler   Inhalation   Inhale 1 puff into the lungs every 4 (four) hours as needed for wheezing or shortness of breath.   1 Inhaler   6   . amLODipine-valsartan (EXFORGE) 10-320 MG per tablet   Oral   Take 1 tablet by mouth daily.           . cefdinir (OMNICEF) 300 MG capsule   Oral   Take 300 mg by mouth 2 (two) times daily. Takes for 10 days.  Unsure of start date.         . cetirizine (ZYRTEC) 10 MG tablet   Oral   Take 10 mg by mouth daily as needed for allergies.         [name] digoxin (LANOXIN) 0.125 MG tablet   Oral   Take 0.125 mg by mouth daily.          [name]  fluticasone (FLONASE) 50 MCG/ACT nasal spray   Each Nare   Place 1 spray into both nostrils daily as needed for allergies or rhinitis.         . Fluticasone-Salmeterol (ADVAIR DISKUS) 250-50 MCG/DOSE AEPB   Inhalation   Inhale 1 puff into the lungs 2 (two) times daily.   60 each   11     HOLD until pt ready to refills. thanks.   . furosemide (LASIX) 40 MG tablet   Oral   Take 40 mg by mouth daily as needed for fluid.          . mirtazapine (REMERON) 15 MG tablet   Oral   Take 15 mg by mouth at bedtime.         . montelukast (SINGULAIR) 10 MG tablet   Oral   Take 10 mg by mouth at bedtime.         . simvastatin (ZOCOR) 5 MG tablet   Oral   Take 1 tablet by mouth at bedtime.          . sodium chloride (OCEAN) 0.65 % nasal spray   Nasal   Place 1 spray into the nose as needed for congestion.         . triamcinolone cream (KENALOG) 0.1 %   Topical   Apply 1 application topically 2 (two) times daily. Applies to legs.         . warfarin (COUMADIN) 5 MG tablet   Oral   Take 2.5-3.75 mg by mouth See admin instructions. Takes  half tablet (2.5mg ) on Monday and Friday Takes 3/4 tablet (2.75mg ) the rest of the week         . HYDROcodone-acetaminophen (NORCO/VICODIN) 5-325 MG per tablet   Oral   Take 1 tablet by mouth every 4 (four) hours as needed for moderate pain.   15 tablet   0   . Ipratropium-Albuterol (COMBIVENT RESPIMAT) 20-100 MCG/ACT AERS respimat   Inhalation   Inhale 1 puff into the lungs every 4 (four) hours as needed for wheezing or shortness of breath.          BP 139/43  Pulse 83  Temp(Src) 97.8 F (36.6 C) (Oral)  Resp 18  SpO2 96% Physical Exam  Nursing note and vitals reviewed. Constitutional: She is oriented to person, place, and time. She appears well-developed and well-nourished. No distress.  HENT:  Head: Normocephalic and atraumatic.  Eyes: EOM are normal.  Neck: Normal range of motion.  Cardiovascular: Normal rate, regular rhythm and normal heart sounds.   Pulmonary/Chest: Effort normal and breath sounds normal.  Abdominal: Soft. She exhibits no distension. There is no tenderness.  Musculoskeletal: Normal range of motion.  Full range of motion bilateral hips.  Mild pain with range of motion of right hip with tenderness on her right iliac crest.  No obvious deformity of her right hip.  Normal PT and DP pulses bilaterally.  No unilateral leg swelling  Neurological: She is alert and oriented to person, place, and time.  Skin: Skin is warm and dry.  Psychiatric: She has a normal mood and affect. Judgment normal.    ED Course  Procedures (including critical care time) Labs Review Labs Reviewed - No data to display Imaging Review Dg Lumbar Spine Complete  05/31/2013   CLINICAL DATA:  Fall, low back pain, right hip pain.  EXAM: LUMBAR SPINE - COMPLETE 4+ VIEW  COMPARISON:  CT 04/18/2013  FINDINGS: Degenerative facet disease in the mid and lower lumbar spine.  Slight anterolisthesis of L5 on S1, stable since prior CT related to facet disease. Disc spaces are maintained. Early  degenerative spurring. No fracture. SI joints are symmetric and unremarkable.  Aortic and iliac calcifications without visible aneurysm.  IMPRESSION: No acute bony abnormality.   Electronically Signed   By: Charlett Nose M.D.   On: 05/31/2013 12:52   Dg Hip Complete Right  05/31/2013   CLINICAL DATA:  Fall, low back pain, right hip pain.  EXAM: RIGHT HIP - COMPLETE 2+ VIEW  COMPARISON:  None.  FINDINGS: Degenerative changes in the hips bilaterally. Joint space narrowing and spurring. Findings are symmetric. SI joints are symmetric and unremarkable. No acute bony abnormality. Specifically, no fracture, subluxation, or dislocation. Soft tissues are intact.  IMPRESSION: Degenerative changes in the hips bilaterally.  No acute findings.   Electronically Signed   By: Charlett Nose M.D.   On: 05/31/2013 12:48  I personally reviewed the imaging tests through PACS system I reviewed available ER/hospitalization records through the EMR   EKG Interpretation   None       MDM   1. Right hip pain   2. Arthritis    Imminent or in the emergency department.  Feels much better.  Discharge home in good condition.    Lyanne Co, MD 05/31/13 [phone]

## 2013-05-31 NOTE — ED Notes (Signed)
Reports falling one week ago and having lower back pain that radiates down right leg, swelling noted to right ankle. Denies syncopal episode but is not sure how she fell. Hx of back surgery.

## 2013-06-02 ENCOUNTER — Other Ambulatory Visit: Payer: Self-pay

## 2013-06-02 MED ORDER — DIGOXIN 125 MCG PO TABS: 0.1250 mg | ORAL_TABLET | Freq: Every day | ORAL | Status: DC

## 2013-06-07 ENCOUNTER — Ambulatory Visit: Payer: Medicare Other | Admitting: Cardiology

## 2013-06-09 ENCOUNTER — Other Ambulatory Visit (HOSPITAL_COMMUNITY): Payer: Self-pay | Admitting: Family Medicine

## 2013-06-09 ENCOUNTER — Telehealth (HOSPITAL_COMMUNITY): Payer: Self-pay | Admitting: *Deleted

## 2013-06-09 DIAGNOSIS — I82401 Acute embolism and thrombosis of unspecified deep veins of right lower extremity: Secondary | ICD-10-CM

## 2013-06-13 ENCOUNTER — Encounter (INDEPENDENT_AMBULATORY_CARE_PROVIDER_SITE_OTHER): Payer: Medicare Other

## 2013-06-13 ENCOUNTER — Encounter: Payer: Self-pay | Admitting: *Deleted

## 2013-06-13 DIAGNOSIS — I4891 Unspecified atrial fibrillation: Secondary | ICD-10-CM

## 2013-06-13 DIAGNOSIS — I480 Paroxysmal atrial fibrillation: Secondary | ICD-10-CM

## 2013-06-13 NOTE — Progress Notes (Signed)
Patient ID: [name], female   DOB: January 16, 1924, 79 y.o.   MRN: [id number] [employer] 30 day cardiac event monitor applied to patient.

## 2013-06-14 ENCOUNTER — Ambulatory Visit (HOSPITAL_COMMUNITY)
Admission: RE | Admit: 2013-06-14 | Discharge: 2013-06-14 | Disposition: A | Discharge status: Home / Self Care | Payer: Medicare Other | Source: Ambulatory Visit | Attending: Cardiovascular Disease | Admitting: Cardiovascular Disease

## 2013-06-14 DIAGNOSIS — M7989 Other specified soft tissue disorders: Secondary | ICD-10-CM

## 2013-06-14 DIAGNOSIS — I82401 Acute embolism and thrombosis of unspecified deep veins of right lower extremity: Secondary | ICD-10-CM

## 2013-06-14 DIAGNOSIS — I82409 Acute embolism and thrombosis of unspecified deep veins of unspecified lower extremity: Secondary | ICD-10-CM | POA: Insufficient documentation

## 2013-06-14 DIAGNOSIS — I82509 Chronic embolism and thrombosis of unspecified deep veins of unspecified lower extremity: Secondary | ICD-10-CM

## 2013-06-14 NOTE — Progress Notes (Signed)
Right Lower Ext. Venous Duplex Completed. Negative for DVT. Pasqual Farias, BS, RDMS, RVT  

## 2013-06-27 NOTE — Telephone Encounter (Signed)
Message copied by Lou Miner on Tue Jun 27, 2013 11:02 AM ------      Message from: Carmela Hurt      Created: Mon Jun 26, 2013  1:22 PM      Regarding: warfarin refill       Warfarin refill send to [facility] for dr turner ------

## 2013-06-27 NOTE — Telephone Encounter (Signed)
Dr. Herb Grays manages her warfarin now.  I notified patient that she needs to call Dr. Collins [place] to have a refill sent to [facility] as we aren't managing it anymore.

## 2013-07-12 ENCOUNTER — Telehealth: Payer: Self-pay | Admitting: Pharmacist

## 2013-07-12 NOTE — Telephone Encounter (Signed)
New problem:  Pt states she has some questions for [place]. Pt would like to know if she can get her Pt/INR done by venipuncture instead of a finger stick. Pt states it is cheaper with her insurance.

## 2013-07-12 NOTE — Telephone Encounter (Signed)
Explained to patient that we only do point of care (finger stick) here, and that with her insurance would charge $40 for this.  Patient states she understands and will continue to go to Dr. Modena Morrow for protimes.

## 2013-07-19 ENCOUNTER — Telehealth: Payer: Self-pay | Admitting: Cardiology

## 2013-07-19 NOTE — Telephone Encounter (Signed)
Please let patient know that heart monitor showed normal rhythm with one episode of paroxysmal atrial fibrillation with occasional extra heart beats from the top part of the heart other than afib. Please find out if she is still having a lot of palpitations

## 2013-07-19 NOTE — Telephone Encounter (Signed)
Pt has not had any palpitations. She said she knew when she went into the PAF. She stated she has felt good.

## 2013-08-05 ENCOUNTER — Emergency Department (HOSPITAL_COMMUNITY)
Admission: EM | Admit: 2013-08-05 | Discharge: 2013-08-05 | Disposition: A | Discharge status: Home / Self Care | Payer: Medicare HMO | Attending: Emergency Medicine | Admitting: Emergency Medicine

## 2013-08-05 ENCOUNTER — Encounter (HOSPITAL_COMMUNITY): Payer: Self-pay | Admitting: Emergency Medicine

## 2013-08-05 DIAGNOSIS — Z8669 Personal history of other diseases of the nervous system and sense organs: Secondary | ICD-10-CM | POA: Insufficient documentation

## 2013-08-05 DIAGNOSIS — Z7901 Long term (current) use of anticoagulants: Secondary | ICD-10-CM | POA: Insufficient documentation

## 2013-08-05 DIAGNOSIS — Z79899 Other long term (current) drug therapy: Secondary | ICD-10-CM | POA: Insufficient documentation

## 2013-08-05 DIAGNOSIS — Z9089 Acquired absence of other organs: Secondary | ICD-10-CM | POA: Insufficient documentation

## 2013-08-05 DIAGNOSIS — R002 Palpitations: Secondary | ICD-10-CM

## 2013-08-05 DIAGNOSIS — I4891 Unspecified atrial fibrillation: Secondary | ICD-10-CM | POA: Insufficient documentation

## 2013-08-05 DIAGNOSIS — M542 Cervicalgia: Secondary | ICD-10-CM

## 2013-08-05 DIAGNOSIS — Z8719 Personal history of other diseases of the digestive system: Secondary | ICD-10-CM | POA: Insufficient documentation

## 2013-08-05 DIAGNOSIS — IMO0002 Reserved for concepts with insufficient information to code with codable children: Secondary | ICD-10-CM | POA: Insufficient documentation

## 2013-08-05 DIAGNOSIS — E785 Hyperlipidemia, unspecified: Secondary | ICD-10-CM | POA: Insufficient documentation

## 2013-08-05 DIAGNOSIS — J45901 Unspecified asthma with (acute) exacerbation: Secondary | ICD-10-CM | POA: Insufficient documentation

## 2013-08-05 DIAGNOSIS — Z9861 Coronary angioplasty status: Secondary | ICD-10-CM | POA: Insufficient documentation

## 2013-08-05 DIAGNOSIS — I10 Essential (primary) hypertension: Secondary | ICD-10-CM | POA: Insufficient documentation

## 2013-08-05 DIAGNOSIS — Z8673 Personal history of transient ischemic attack (TIA), and cerebral infarction without residual deficits: Secondary | ICD-10-CM | POA: Insufficient documentation

## 2013-08-05 DIAGNOSIS — Z880 Allergy status to penicillin: Secondary | ICD-10-CM | POA: Insufficient documentation

## 2013-08-05 DIAGNOSIS — Z87891 Personal history of nicotine dependence: Secondary | ICD-10-CM | POA: Insufficient documentation

## 2013-08-05 DIAGNOSIS — I2510 Atherosclerotic heart disease of native coronary artery without angina pectoris: Secondary | ICD-10-CM | POA: Insufficient documentation

## 2013-08-05 LAB — POCT I-STAT TROPONIN I: Troponin i, poc: 0 ng/mL (ref 0.00–0.08)

## 2013-08-05 LAB — CBC WITH DIFFERENTIAL/PLATELET
Basophils Absolute: 0 10*3/uL (ref 0.0–0.1)
Basophils Relative: 0 % (ref 0–1)
Eosinophils Absolute: 0.5 10*3/uL (ref 0.0–0.7)
Eosinophils Relative: 5 % (ref 0–5)
HCT: 39.6 % (ref 36.0–46.0)
Hemoglobin: 13.4 g/dL (ref 12.0–15.0)
Lymphocytes Relative: 21 % (ref 12–46)
Lymphs Abs: 2.4 10*3/uL (ref 0.7–4.0)
MCH: 29.8 pg (ref 26.0–34.0)
MCHC: 33.8 g/dL (ref 30.0–36.0)
MCV: 88 fL (ref 78.0–100.0)
Monocytes Absolute: 0.8 K/uL (ref 0.1–1.0)
Monocytes Relative: 7 % (ref 3–12)
Neutro Abs: 7.6 10*3/uL (ref 1.7–7.7)
Neutrophils Relative %: 67 % (ref 43–77)
Platelets: 206 10*3/uL (ref 150–400)
RBC: 4.5 MIL/uL (ref 3.87–5.11)
RDW: 14.8 % (ref 11.5–15.5)
WBC: 11.3 10*3/uL — ABNORMAL HIGH (ref 4.0–10.5)

## 2013-08-05 LAB — URINALYSIS, ROUTINE W REFLEX MICROSCOPIC
Bilirubin Urine: NEGATIVE
Glucose, UA: NEGATIVE mg/dL
Hgb urine dipstick: NEGATIVE
Ketones, ur: NEGATIVE mg/dL
Nitrite: NEGATIVE
Protein, ur: NEGATIVE mg/dL
Specific Gravity, Urine: 1.009 (ref 1.005–1.030)
Urobilinogen, UA: 0.2 mg/dL (ref 0.0–1.0)
pH: 7.5 (ref 5.0–8.0)

## 2013-08-05 LAB — COMPREHENSIVE METABOLIC PANEL WITH GFR
CO2: 26 meq/L (ref 19–32)
Calcium: 9.2 mg/dL (ref 8.4–10.5)
Creatinine, Ser: 0.83 mg/dL (ref 0.50–1.10)
GFR calc Af Amer: 71 mL/min — ABNORMAL LOW (ref >90)
GFR calc non Af Amer: 62 mL/min — ABNORMAL LOW (ref >90)

## 2013-08-05 LAB — URINE MICROSCOPIC-ADD ON

## 2013-08-05 LAB — COMPREHENSIVE METABOLIC PANEL
ALT: 14 U/L (ref 0–35)
AST: 20 U/L (ref 0–37)
Albumin: 3.8 g/dL (ref 3.5–5.2)
Alkaline Phosphatase: 64 U/L (ref 39–117)
BUN: 11 mg/dL (ref 6–23)
Chloride: 104 mEq/L (ref 96–112)
Glucose, Bld: 105 mg/dL — ABNORMAL HIGH (ref 70–99)
Potassium: 4.5 mEq/L (ref 3.7–5.3)
Sodium: 143 mEq/L (ref 137–147)
Total Bilirubin: 0.4 mg/dL (ref 0.3–1.2)
Total Protein: 7.6 g/dL (ref 6.0–8.3)

## 2013-08-05 MED ORDER — LORAZEPAM 1 MG PO TABS
0.5000 mg | ORAL_TABLET | Freq: Once | ORAL | Status: AC
  Administered 2013-08-05: 0.5 mg via ORAL
  Filled 2013-08-05: qty 1

## 2013-08-05 MED ORDER — TRAMADOL HCL 50 MG PO TABS: 50.0000 mg | ORAL_TABLET | Freq: Four times a day (QID) | ORAL | Status: DC | PRN

## 2013-08-05 MED ORDER — ALPRAZOLAM 0.5 MG PO TABS: 0.5000 mg | ORAL_TABLET | Freq: Two times a day (BID) | ORAL | Status: DC | PRN

## 2013-08-05 MED ORDER — TRAMADOL HCL 50 MG PO TABS
50.0000 mg | ORAL_TABLET | Freq: Once | ORAL | Status: AC
  Administered 2013-08-05: 50 mg via ORAL
  Filled 2013-08-05: qty 1

## 2013-08-05 NOTE — ED Provider Notes (Signed)
CSN: [id number]     Arrival date & time 08/05/13  5732 History   First MD Initiated Contact with Patient 08/05/13 0710     Chief Complaint  Patient presents with  . Neck Pain   (Consider location/radiation/quality/duration/timing/severity/associated sxs/prior Treatment) Patient is a 80 y.o. female presenting with neck pain and palpitations. The history is provided by the patient and a relative.  Neck Pain Pain location:  L side Quality:  Stabbing Pain radiates to:  L shoulder Pain severity:  Moderate Onset quality:  Sudden Duration:  2 hours Timing:  Constant Progression:  Partially resolved Chronicity:  New Context: not recent injury   Relieved by:  Nothing Worsened by:  Nothing tried Associated symptoms: no chest pain, no headaches, no numbness and no syncope   Palpitations Palpitations quality:  Fast Onset quality:  Sudden Duration:  1 hour Timing:  Constant Progression:  Resolved Chronicity:  Recurrent Context: anxiety   Relieved by:  None tried Worsened by:  Nothing tried Ineffective treatments:  None tried Associated symptoms: no chest pain, no cough, no dizziness, no nausea, no numbness, no shortness of breath and no syncope     Past Medical History  Diagnosis Date  . Esophageal reflux   . Glaucoma   . Diverticulosis   . PAF (paroxysmal atrial fibrillation)     intolerant to beta blockers due to asthma and intolerant to CCB due to rash  . HTN (hypertension), benign   . Asthma     copd  . CAD (coronary artery disease) 2007    s/p PCI of diagonal  . Hyperlipidemia   . Stroke 02/2011    TIA   Past Surgical History  Procedure Laterality Date  . Appendectomy    . Vesicovaginal fistula closure w/ tah    . Nasal sinus surgery    . Cholecystectomy    . Coronary angioplasty with stent placement     Family History  Problem Relation Age of Onset  . Heart disease Father   . Hemachromatosis Father   . Colon cancer Mother   . Peripheral vascular disease  Mother   . Peripheral vascular disease Sister   . Lymphoma Sister   . Heart attack Brother   . Heart disease Brother    History  Substance Use Topics  . Smoking status: Former Smoker -- 0.20 packs/day for 25 years    Types: Cigarettes    Quit date: 07/06/1966  . Smokeless tobacco: Not on file  . Alcohol Use: No   OB History   Grav Para Term Preterm Abortions TAB SAB Ect Mult Living                 Review of Systems  Respiratory: Negative for cough, chest tightness and shortness of breath.   Cardiovascular: Positive for palpitations. Negative for chest pain and syncope.  Gastrointestinal: Negative for nausea.  Musculoskeletal: Positive for neck pain. Negative for arthralgias and neck stiffness.  Skin: Negative for rash.  Neurological: Negative for dizziness, numbness and headaches.  All other systems reviewed and are negative.    Allergies  Brovana; Budesonide; Cephalexin; Clonidine derivatives; Iohexol; Penicillins; Sulfonamide derivatives; Tetracycline; Vancomycin; and Cardizem  Home Medications   Current Outpatient Rx  Name  Route  Sig  Dispense  Refill  . acetaminophen (TYLENOL) 325 MG tablet   Oral   Take 325 mg by mouth every 6 (six) hours as needed for moderate pain.          [name] amLODipine-valsartan (EXFORGE) 10-320 MG per  tablet   Oral   Take 1 tablet by mouth daily.          . cetirizine (ZYRTEC) 10 MG tablet   Oral   Take 10 mg by mouth daily as needed for allergies.         [name] digoxin (LANOXIN) 0.125 MG tablet   Oral   Take 1 tablet (0.125 mg total) by mouth daily.   30 tablet   6   . fluticasone (FLONASE) 50 MCG/ACT nasal spray   Each Nare   Place 1 spray into both nostrils daily as needed for allergies or rhinitis.         . Fluticasone-Salmeterol (ADVAIR) 250-50 MCG/DOSE AEPB   Inhalation   Inhale 1 puff into the lungs 2 (two) times daily.         . furosemide (LASIX) 40 MG tablet   Oral   Take 40 mg by mouth daily as needed for  fluid.          [name] latanoprost (XALATAN) 0.005 % ophthalmic solution   Both Eyes   Place 1 drop into both eyes at bedtime.         . simvastatin (ZOCOR) 5 MG tablet   Oral   Take 5 mg by mouth at bedtime.         . sodium chloride (OCEAN) 0.65 % nasal spray   Nasal   Place 1 spray into the nose as needed for congestion.         [name] warfarin (COUMADIN) 5 MG tablet   Oral   Take 2.5-3.75 mg by mouth See admin instructions. Takes half tablet (2.5mg ) on Monday and Friday Takes 3/4 tablet (2.75mg ) the rest of the week         . albuterol (PROVENTIL HFA;VENTOLIN HFA) 108 (90 BASE) MCG/ACT inhaler   Inhalation   Inhale 1 puff into the lungs every 4 (four) hours as needed for wheezing or shortness of breath.         . ALPRAZolam (XANAX) 0.5 MG tablet   Oral   Take 1 tablet (0.5 mg total) by mouth 2 (two) times daily as needed for anxiety.   10 tablet   0   . traMADol (ULTRAM) 50 MG tablet   Oral   Take 1 tablet (50 mg total) by mouth every 6 (six) hours as needed.   15 tablet   0    BP 135/53  Pulse 77  Temp(Src) 97.5 F (36.4 C) (Oral)  Resp 21  SpO2 94% Physical Exam  Nursing note and vitals reviewed. Constitutional: She is oriented to person, place, and time. She appears well-developed and well-nourished.  HENT:  Head: Normocephalic and atraumatic.  Mouth/Throat: Oropharynx is clear and moist.  Neck: Normal range of motion. Neck supple.  Cardiovascular: Normal rate, regular rhythm and intact distal pulses.   No murmur heard. Pulmonary/Chest: Effort normal. No respiratory distress. She has no wheezes.  Abdominal: Soft. She exhibits no distension. There is no tenderness. There is no rebound.  Musculoskeletal:       Cervical back: She exhibits tenderness and pain. She exhibits no deformity.       Back:  Neurological: She is alert and oriented to person, place, and time. She exhibits normal muscle tone. Coordination normal.  Skin: Skin is warm. She is not  diaphoretic.    ED Course  Procedures (including critical care time) Labs Review Labs Reviewed  CBC WITH DIFFERENTIAL - Abnormal; Notable for the following:  WBC 11.3 (*)    All other components within normal limits  COMPREHENSIVE METABOLIC PANEL - Abnormal; Notable for the following:    Glucose, Bld 105 (*)    GFR calc non Af Amer 62 (*)    GFR calc Af Amer 71 (*)    All other components within normal limits  URINALYSIS, ROUTINE W REFLEX MICROSCOPIC - Abnormal; Notable for the following:    APPearance CLOUDY (*)    Leukocytes, UA SMALL (*)    All other components within normal limits  URINE MICROSCOPIC-ADD ON  POCT I-STAT TROPONIN I   Imaging Review No results found.  EKG Interpretation    Date/Time:  Saturday August 05 2013 06:29:57 EST Ventricular Rate:  76 PR Interval:  196 QRS Duration: 94 QT Interval:  370 QTC Calculation: 416 R Axis:   50 Text Interpretation:  Normal sinus rhythm with sinus arrhythmia Nonspecific ST and T wave abnormality Abnormal ECG Incomplete right bundle branch block No significant change since last tracing Confirmed by [facility]  MD, MICHEAL ([id number]) on 08/05/2013 7:19:15 AM           RA sat is 94% and I interpret to be adequate  MDM   1. Neck pain on left side   2. Palpitations    Pt with palpitations, prior records in [facility] indicate h/o atrial fib and palpitation complaint in the past.  Her ECG shows no sig change, sinus rhythm, pt is reassured.  Neck pain is reproducible, will treat as musculoskeletal, and since pt admits to some anxiety, family feels pt has anxiety episodes, will give some xanax which would treat both anxiety and muscle spasms.  Ultram for pain.      Saddie Benders. Ousman Dise, MD 08/05/13 1147

## 2013-08-05 NOTE — ED Notes (Signed)
Pt. reports left neck pain onset last night , denies injury , no SOB / denies chest pain or palpitations .

## 2013-08-05 NOTE — ED Notes (Addendum)
Dr. Ghim at bedside. 

## 2013-08-05 NOTE — Discharge Instructions (Signed)
Palpitations   A palpitation is the feeling that your heartbeat is irregular or is faster than normal. It may feel like your heart is fluttering or skipping a beat. Palpitations are usually not a serious problem. However, in some cases, you may need further medical evaluation.  CAUSES   Palpitations can be caused by:   Smoking.   Caffeine or other stimulants, such as diet pills or energy drinks.   Alcohol.   Stress and anxiety.   Strenuous physical activity.   Fatigue.   Certain medicines.   Heart disease, especially if you have a history of arrhythmias. This includes atrial fibrillation, atrial flutter, or supraventricular tachycardia.   An improperly working pacemaker or defibrillator.  DIAGNOSIS   To find the cause of your palpitations, your caregiver will take your history and perform a physical exam. Tests may also be done, including:   Electrocardiography (ECG). This test records the heart's electrical activity.   Cardiac monitoring. This allows your caregiver to monitor your heart rate and rhythm in real time.   Holter monitor. This is a portable device that records your heartbeat and can help diagnose heart arrhythmias. It allows your caregiver to track your heart activity for several days, if needed.   Stress tests by exercise or by giving medicine that makes the heart beat faster.  TREATMENT   Treatment of palpitations depends on the cause of your symptoms and can vary greatly. Most cases of palpitations do not require any treatment other than time, relaxation, and monitoring your symptoms. Other causes, such as atrial fibrillation, atrial flutter, or supraventricular tachycardia, usually require further treatment.  HOME CARE INSTRUCTIONS    Avoid:   Caffeinated coffee, tea, soft drinks, diet pills, and energy drinks.   Chocolate.   Alcohol.   Stop smoking if you smoke.   Reduce your stress and anxiety. Things that can help you relax include:   A method that measures bodily functions so  you can learn to control them (biofeedback).   Yoga.   Meditation.   Physical activity such as swimming, jogging, or walking.   Get plenty of rest and sleep.  SEEK MEDICAL CARE IF:    You continue to have a fast or irregular heartbeat beyond 24 hours.   Your palpitations occur more often.  SEEK IMMEDIATE MEDICAL CARE IF:   You develop chest pain or shortness of breath.   You have a severe headache.   You feel dizzy, or you faint.  MAKE SURE YOU:   Understand these instructions.   Will watch your condition.   Will get help right away if you are not doing well or get worse.  Document Released: 06/19/2000 Document Revised: 10/17/2012 Document Reviewed: 08/21/2011  [employer] Patient Information 2014 [employer]

## 2013-09-04 ENCOUNTER — Other Ambulatory Visit: Payer: Self-pay | Admitting: *Deleted

## 2013-09-04 MED ORDER — AMLODIPINE BESYLATE-VALSARTAN 10-320 MG PO TABS: 1.0000 | ORAL_TABLET | Freq: Every day | ORAL | Status: DC

## 2013-09-26 ENCOUNTER — Other Ambulatory Visit: Payer: Self-pay | Admitting: Dermatology

## 2013-10-10 ENCOUNTER — Other Ambulatory Visit: Payer: Self-pay | Admitting: Dermatology

## 2013-11-09 ENCOUNTER — Ambulatory Visit: Payer: Medicare Other | Admitting: Pulmonary Disease

## 2013-11-16 DIAGNOSIS — H401122 Primary open-angle glaucoma, left eye, moderate stage: Secondary | ICD-10-CM | POA: Insufficient documentation

## 2013-11-16 DIAGNOSIS — H401111 Primary open-angle glaucoma, right eye, mild stage: Secondary | ICD-10-CM | POA: Insufficient documentation

## 2013-12-05 ENCOUNTER — Ambulatory Visit (INDEPENDENT_AMBULATORY_CARE_PROVIDER_SITE_OTHER): Payer: Medicare HMO | Admitting: Pulmonary Disease

## 2013-12-05 ENCOUNTER — Encounter: Payer: Self-pay | Admitting: Pulmonary Disease

## 2013-12-05 VITALS — BP 150/78 | HR 57 | Temp 97.6°F | Ht 67.0 in | Wt 143.4 lb

## 2013-12-05 DIAGNOSIS — J449 Chronic obstructive pulmonary disease, unspecified: Secondary | ICD-10-CM

## 2013-12-05 NOTE — Progress Notes (Signed)
   Subjective:    Patient ID: [name], female    DOB: 07/19/1923, 80 y.o.   MRN: [id number]  HPI The patient comes in today for followup of her known severe chronic obstructive asthma with allergic component. She has been off her Xolair for some time now, as well as decreased Advair strength. Despite this, she has done fairly well from a pulmonary standpoint, with no recent acute exacerbation. She has had worsening nasal polyps that interfere with her sense of smell, despite using nasal steroids and after we have started her on Singulair. She is also taking an antihistamine regularly. She has been receiving steroid injections from her primary care physician for her nasal polyposis with some improvement. She does not want to consider polypectomy   Review of Systems  Constitutional: Negative for fever and unexpected weight change.  HENT: Positive for congestion, postnasal drip and sneezing. Negative for dental problem, ear pain, nosebleeds, rhinorrhea, sinus pressure, sore throat and trouble swallowing.   Eyes: Negative for redness and itching.  Respiratory: Positive for cough. Negative for chest tightness, shortness of breath and wheezing.   Cardiovascular: Negative for palpitations and leg swelling.  Gastrointestinal: Negative for nausea and vomiting.  Genitourinary: Negative for dysuria.  Musculoskeletal: Negative for joint swelling.  Skin: Negative for rash.  Neurological: Negative for headaches.  Hematological: Does not bruise/bleed easily.  Psychiatric/Behavioral: Negative for dysphoric mood. The patient is not nervous/anxious.        Objective:   Physical Exam Thin female in no acute distress Nose without purulence or discharge noted Neck without lymphadenopathy or thyromegaly Chest with mildly decreased breath sounds, a rare and expiratory wheeze, but overall fairly clear Cardiac exam with regular rate and rhythm Lower extremities with mild ankle edema, no  cyanosis Alert and oriented, moves all 4 extremities.       Assessment & Plan:

## 2013-12-05 NOTE — Assessment & Plan Note (Signed)
The patient is doing fairly well from an asthma standpoint on moderate dose Advair and off Xolair. She is having a lot of allergy issues manifested primarily as polyposis, but has not had an acute exacerbation of her asthma. I have asked her to continue on her current medications, as well as her nasal hygiene regimen. I have discussed with her possibly going back on Xolair in order to treat her allergic component, but she would like to hold off for now.

## 2013-12-05 NOTE — Patient Instructions (Signed)
Continue on current medications. Keep up with allergy medication  followup with me again in 62mos, but call if having issues.

## 2013-12-26 ENCOUNTER — Other Ambulatory Visit: Payer: Self-pay | Admitting: Cardiology

## 2014-02-09 ENCOUNTER — Other Ambulatory Visit: Payer: Self-pay

## 2014-02-09 MED ORDER — SIMVASTATIN 5 MG PO TABS: 5.0000 mg | ORAL_TABLET | Freq: Every day | ORAL | Status: DC

## 2014-02-28 ENCOUNTER — Emergency Department (HOSPITAL_COMMUNITY)
Admission: EM | Admit: 2014-02-28 | Discharge: 2014-03-01 | Disposition: A | Discharge status: Home / Self Care | Payer: Medicare HMO | Attending: Emergency Medicine | Admitting: Emergency Medicine

## 2014-02-28 ENCOUNTER — Encounter (HOSPITAL_COMMUNITY): Payer: Self-pay | Admitting: Emergency Medicine

## 2014-02-28 ENCOUNTER — Emergency Department (HOSPITAL_COMMUNITY): Payer: Medicare HMO

## 2014-02-28 DIAGNOSIS — Y9289 Other specified places as the place of occurrence of the external cause: Secondary | ICD-10-CM | POA: Insufficient documentation

## 2014-02-28 DIAGNOSIS — S7000XA Contusion of unspecified hip, initial encounter: Secondary | ICD-10-CM | POA: Insufficient documentation

## 2014-02-28 DIAGNOSIS — J45909 Unspecified asthma, uncomplicated: Secondary | ICD-10-CM | POA: Insufficient documentation

## 2014-02-28 DIAGNOSIS — S79929A Unspecified injury of unspecified thigh, initial encounter: Secondary | ICD-10-CM

## 2014-02-28 DIAGNOSIS — Y93K1 Activity, walking an animal: Secondary | ICD-10-CM | POA: Insufficient documentation

## 2014-02-28 DIAGNOSIS — S8012XA Contusion of left lower leg, initial encounter: Secondary | ICD-10-CM

## 2014-02-28 DIAGNOSIS — I4891 Unspecified atrial fibrillation: Secondary | ICD-10-CM | POA: Diagnosis not present

## 2014-02-28 DIAGNOSIS — Z8719 Personal history of other diseases of the digestive system: Secondary | ICD-10-CM | POA: Diagnosis not present

## 2014-02-28 DIAGNOSIS — S61519A Laceration without foreign body of unspecified wrist, initial encounter: Secondary | ICD-10-CM | POA: Diagnosis present

## 2014-02-28 DIAGNOSIS — Z8669 Personal history of other diseases of the nervous system and sense organs: Secondary | ICD-10-CM | POA: Diagnosis not present

## 2014-02-28 DIAGNOSIS — I2510 Atherosclerotic heart disease of native coronary artery without angina pectoris: Secondary | ICD-10-CM | POA: Insufficient documentation

## 2014-02-28 DIAGNOSIS — S79919A Unspecified injury of unspecified hip, initial encounter: Secondary | ICD-10-CM | POA: Diagnosis present

## 2014-02-28 DIAGNOSIS — S61411A Laceration without foreign body of right hand, initial encounter: Secondary | ICD-10-CM

## 2014-02-28 DIAGNOSIS — Z880 Allergy status to penicillin: Secondary | ICD-10-CM | POA: Diagnosis not present

## 2014-02-28 DIAGNOSIS — Z8673 Personal history of transient ischemic attack (TIA), and cerebral infarction without residual deficits: Secondary | ICD-10-CM | POA: Insufficient documentation

## 2014-02-28 DIAGNOSIS — Z85820 Personal history of malignant melanoma of skin: Secondary | ICD-10-CM | POA: Insufficient documentation

## 2014-02-28 DIAGNOSIS — Z862 Personal history of diseases of the blood and blood-forming organs and certain disorders involving the immune mechanism: Secondary | ICD-10-CM | POA: Insufficient documentation

## 2014-02-28 DIAGNOSIS — Z79899 Other long term (current) drug therapy: Secondary | ICD-10-CM | POA: Insufficient documentation

## 2014-02-28 DIAGNOSIS — W19XXXA Unspecified fall, initial encounter: Secondary | ICD-10-CM

## 2014-02-28 DIAGNOSIS — W010XXA Fall on same level from slipping, tripping and stumbling without subsequent striking against object, initial encounter: Secondary | ICD-10-CM | POA: Insufficient documentation

## 2014-02-28 DIAGNOSIS — Z87891 Personal history of nicotine dependence: Secondary | ICD-10-CM | POA: Insufficient documentation

## 2014-02-28 DIAGNOSIS — IMO0002 Reserved for concepts with insufficient information to code with codable children: Secondary | ICD-10-CM | POA: Insufficient documentation

## 2014-02-28 DIAGNOSIS — Z8639 Personal history of other endocrine, nutritional and metabolic disease: Secondary | ICD-10-CM | POA: Insufficient documentation

## 2014-02-28 DIAGNOSIS — Z9889 Other specified postprocedural states: Secondary | ICD-10-CM | POA: Diagnosis not present

## 2014-02-28 DIAGNOSIS — I10 Essential (primary) hypertension: Secondary | ICD-10-CM | POA: Insufficient documentation

## 2014-02-28 HISTORY — DX: Malignant melanoma of skin, unspecified: C43.9

## 2014-02-28 LAB — CBC WITH DIFFERENTIAL/PLATELET
Basophils Absolute: 0 10*3/uL (ref 0.0–0.1)
Basophils Relative: 0 % (ref 0–1)
EOS PCT: 4 % (ref 0–5)
Eosinophils Absolute: 0.4 10*3/uL (ref 0.0–0.7)
HEMATOCRIT: 35.3 % — AB (ref 36.0–46.0)
Hemoglobin: 11.7 g/dL — ABNORMAL LOW (ref 12.0–15.0)
LYMPHS ABS: 2 10*3/uL (ref 0.7–4.0)
LYMPHS PCT: 22 % (ref 12–46)
MCH: 29.3 pg (ref 26.0–34.0)
MCHC: 33.1 g/dL (ref 30.0–36.0)
MCV: 88.3 fL (ref 78.0–100.0)
MONO ABS: 0.6 10*3/uL (ref 0.1–1.0)
Monocytes Relative: 7 % (ref 3–12)
Neutro Abs: 6.2 10*3/uL (ref 1.7–7.7)
Neutrophils Relative %: 67 % (ref 43–77)
Platelets: 119 10*3/uL — ABNORMAL LOW (ref 150–400)
RBC: 4 MIL/uL (ref 3.87–5.11)
RDW: 15.5 % (ref 11.5–15.5)
WBC: 9.3 10*3/uL (ref 4.0–10.5)

## 2014-02-28 LAB — COMPREHENSIVE METABOLIC PANEL
ALT: 12 U/L (ref 0–35)
ANION GAP: 14 (ref 5–15)
AST: 20 U/L (ref 0–37)
Albumin: 3.5 g/dL (ref 3.5–5.2)
Alkaline Phosphatase: 56 U/L (ref 39–117)
BUN: 12 mg/dL (ref 6–23)
CO2: 24 meq/L (ref 19–32)
CREATININE: 1.02 mg/dL (ref 0.50–1.10)
Calcium: 8.9 mg/dL (ref 8.4–10.5)
Chloride: 103 mEq/L (ref 96–112)
GFR calc Af Amer: 55 mL/min — ABNORMAL LOW (ref >90)
GFR, EST NON AFRICAN AMERICAN: 48 mL/min — AB (ref >90)
GLUCOSE: 120 mg/dL — AB (ref 70–99)
Potassium: 4.2 mEq/L (ref 3.7–5.3)
SODIUM: 141 meq/L (ref 137–147)
Total Bilirubin: 0.2 mg/dL — ABNORMAL LOW (ref 0.3–1.2)
Total Protein: 7.1 g/dL (ref 6.0–8.3)

## 2014-02-28 LAB — PROTIME-INR
INR: 2.63 — ABNORMAL HIGH (ref 0.00–1.49)
Prothrombin Time: 28.1 seconds — ABNORMAL HIGH (ref 11.6–15.2)

## 2014-02-28 MED ORDER — ONDANSETRON HCL 4 MG/2ML IJ SOLN
4.0000 mg | Freq: Once | INTRAMUSCULAR | Status: AC
  Administered 2014-02-28: 4 mg via INTRAVENOUS
  Filled 2014-02-28: qty 2

## 2014-02-28 MED ORDER — FENTANYL CITRATE 0.05 MG/ML IJ SOLN
50.0000 ug | Freq: Once | INTRAMUSCULAR | Status: AC
  Administered 2014-02-28: 50 ug via INTRAVENOUS
  Filled 2014-02-28: qty 2

## 2014-02-28 NOTE — ED Provider Notes (Signed)
CSN: [id number]     Arrival date & time 02/28/14  2135 History   First MD Initiated Contact with Patient 02/28/14 2146     Chief Complaint  Patient presents with  . Fall  . Hip Pain     (Consider location/radiation/quality/duration/timing/severity/associated sxs/prior Treatment) HPI  [name] is a 80 y.o. female with past medical history of reflux, coronary artery disease, CVA, atrial fibrillation, and hypertension presenting following a fall. Patient states that she was in her backyard with her dog, and fell onto her left hip when the dog pulled her on a leash. Patient denies loss of consciousness. 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 Also sustained a right hand skin tear during the fall. Denies any other associated injuries. Denies any symptoms associated with of also chest chest pain, shortness of breath, nausea, vomiting, lightheadedness, or abdominal pain. No other recent symptoms, or medical complaints. Patient is on chronic anticoagulation therapy due to her atrial fibrillation.     Past Medical History  Diagnosis Date  . Esophageal reflux   . Glaucoma   . Diverticulosis   . PAF (paroxysmal atrial fibrillation)     intolerant to beta blockers due to asthma and intolerant to CCB due to rash  . HTN (hypertension), benign   . Asthma     copd  . CAD (coronary artery disease) 2007    s/p PCI of diagonal  . Hyperlipidemia   . Stroke 02/2011    TIA  . A-fib   . Malignant melanoma     remove 09/2012   Past Surgical History  Procedure Laterality Date  . Appendectomy    . Vesicovaginal fistula closure w/ tah    . Nasal sinus surgery    . Cholecystectomy    . Coronary angioplasty with stent placement    . Abdominal hysterectomy    . Eye surgery     Family History  Problem Relation Age of Onset  . Heart disease Father   . Hemachromatosis Father   . Colon cancer Mother   . Peripheral vascular disease Mother   . Peripheral vascular  disease Sister   . Lymphoma Sister   . Heart attack Brother   . Heart disease Brother    History  Substance Use Topics  . Smoking status: Former Smoker -- 0.20 packs/day for 25 years    Types: Cigarettes    Quit date: 07/06/1966  . Smokeless tobacco: Not on file  . Alcohol Use: No   OB History   Grav Para Term Preterm Abortions TAB SAB Ect Mult Living                 Review of Systems  Constitutional: Negative.   HENT: Negative.   Eyes: Negative.   Respiratory: Negative.   Cardiovascular: Negative.   Gastrointestinal: Negative.   Endocrine: Negative.   Genitourinary: Negative.   Musculoskeletal: Positive for arthralgias and gait problem. Negative for back pain.  Skin: Positive for wound.  Allergic/Immunologic: Negative.   Neurological: Negative.   Hematological: Negative.   Psychiatric/Behavioral: Negative.       Allergies  Brovana; Budesonide; Cephalexin; Clonidine derivatives; Iohexol; Penicillins; Sulfonamide derivatives; Tetracycline; Vancomycin; and Cardizem  Home Medications   Prior to Admission medications   Medication Sig Start Date End Date Taking? Authorizing Provider  acetaminophen (TYLENOL) 325 MG tablet Take 325 mg by mouth every 6 (six) hours as needed for moderate pain.    Yes Historical Provider, MD  albuterol (PROVENTIL  HFA;VENTOLIN HFA) 108 (90 BASE) MCG/ACT inhaler Inhale 1 puff into the lungs every 4 (four) hours as needed for wheezing or shortness of breath. 05/11/13  Yes Kathee Delton, MD  ALPRAZolam Duanne Moron) 0.5 MG tablet Take 1 tablet (0.5 mg total) by mouth 2 (two) times daily as needed for anxiety. 08/05/13  Yes Saddie Benders. Ghim, MD  amLODipine-valsartan (EXFORGE) 10-320 MG per tablet Take 1 tablet by mouth daily.   Yes Historical Provider, MD  cetirizine (ZYRTEC) 10 MG tablet Take 10 mg by mouth daily as needed for allergies.   Yes Historical Provider, MD  digoxin (LANOXIN) 0.125 MG tablet Take 0.125 mg by mouth daily.   Yes Historical  Provider, MD  fluticasone (FLONASE) 50 MCG/ACT nasal spray Place 1 spray into both nostrils daily as needed for allergies or rhinitis.   Yes Historical Provider, MD  Fluticasone-Salmeterol (ADVAIR) 250-50 MCG/DOSE AEPB Inhale 1 puff into the lungs 2 (two) times daily. 05/11/13  Yes Kathee Delton, MD  latanoprost (XALATAN) 0.005 % ophthalmic solution Place 1 drop into both eyes at bedtime.   Yes Historical Provider, MD  simvastatin (ZOCOR) 5 MG tablet Take 1 tablet (5 mg total) by mouth at bedtime. 02/09/14  Yes Sueanne Margarita, MD  sodium chloride (OCEAN) 0.65 % nasal spray Place 1 spray into the nose as needed for congestion.   Yes Historical Provider, MD  warfarin (COUMADIN) 5 MG tablet Take 2.5-3.75 mg by mouth See admin instructions. Takes half tablet (2.5mg ) on Monday and Friday Takes 3/4 tablet (2.75mg ) the rest of the week   Yes Historical Provider, MD  furosemide (LASIX) 40 MG tablet Take 1 tablet (40 mg total) by mouth daily as needed for fluid. 03/01/14   Sueanne Margarita, MD  ondansetron (ZOFRAN) 4 MG tablet Take 1 tablet (4 mg total) by mouth every 6 (six) hours. 03/01/14   Hoyle Sauer, MD  traMADol (ULTRAM) 50 MG tablet Take 1 tablet (50 mg total) by mouth every 6 (six) hours as needed. 03/01/14   Hoyle Sauer, MD   BP 148/75  Pulse 88  Temp(Src) 96.3 F (35.7 C) (Oral)  Resp 16  Ht 5\' 8"  (1.727 m)  Wt 143 lb (64.864 kg)  BMI 21.75 kg/m2  SpO2 98% Physical Exam  Nursing note and vitals reviewed. Constitutional: She is oriented to person, place, and time. She appears well-developed and well-nourished. No distress.  HENT:  Head: Normocephalic and atraumatic.  Right Ear: External ear normal.  Left Ear: External ear normal.  Nose: Nose normal.  Mouth/Throat: Oropharynx is clear and moist. No oropharyngeal exudate.  Eyes: Conjunctivae and EOM are normal. Pupils are equal, round, and reactive to light. Right eye exhibits no discharge. Left eye exhibits no discharge. No scleral  icterus.  Neck: Normal range of motion. Neck supple. No JVD present. No tracheal deviation present. No thyromegaly present.  Cardiovascular: Normal rate, regular rhythm, normal heart sounds and intact distal pulses.  Exam reveals no gallop and no friction rub.   No murmur heard. Pulmonary/Chest: Effort normal and breath sounds normal. No stridor. No respiratory distress. She has no wheezes. She has no rales. She exhibits no tenderness.  Abdominal: Soft. Bowel sounds are normal. She exhibits no distension. There is no tenderness. There is no rebound and no guarding.  Musculoskeletal: Normal range of motion. She exhibits edema and tenderness.       Left hip: She exhibits tenderness and swelling. She exhibits normal range of motion, normal strength, no bony tenderness, no  crepitus and no deformity.       Hands:      Legs: Posterior left thigh hematoma.  Skin tear near base of R thumb.  Lymphadenopathy:    She has no cervical adenopathy.  Neurological: She is alert and oriented to person, place, and time. She has normal reflexes. She is not disoriented. She displays no atrophy and normal reflexes. No cranial nerve deficit or sensory deficit. She exhibits normal muscle tone. She displays no seizure activity. Coordination normal. GCS eye subscore is 4. GCS verbal subscore is 5. GCS motor subscore is 6.  Skin: Skin is warm and dry. No rash noted. She is not diaphoretic. No erythema. No pallor.  Psychiatric: She has a normal mood and affect. Her behavior is normal. Judgment and thought content normal.    ED Course  Procedures (including critical care time) Labs Review Labs Reviewed  PROTIME-INR - Abnormal; Notable for the following:    Prothrombin Time 28.1 (*)    INR 2.63 (*)    All other components within normal limits  CBC WITH DIFFERENTIAL - Abnormal; Notable for the following:    Hemoglobin 11.7 (*)    HCT 35.3 (*)    Platelets 119 (*)    All other components within normal limits    COMPREHENSIVE METABOLIC PANEL - Abnormal; Notable for the following:    Glucose, Bld 120 (*)    Total Bilirubin 0.2 (*)    GFR calc non Af Amer 48 (*)    GFR calc Af Amer 55 (*)    All other components within normal limits    Imaging Review Dg Pelvis 1-2 Views  02/28/2014   CLINICAL DATA:  Left femur pain following a fall today.  EXAM: PELVIS - 1-2 VIEW  COMPARISON:  Pelvis CT dated 04/18/2013.  FINDINGS: Diffuse osteopenia. No fracture or dislocation seen. Atheromatous arterial calcifications.  IMPRESSION: No fracture or dislocation.   Electronically Signed   By: Enrique Sack M.D.   On: 02/28/2014 23:51   Dg Femur Left  03/01/2014   CLINICAL DATA:  Left femur pain following a fall today.  EXAM: LEFT FEMUR - 2 VIEW  COMPARISON:  Pelvis radiograph obtained at the same time. Pelvis CT dated 04/18/2013.  FINDINGS: Diffuse osteopenia. No fracture or dislocation seen. Mild atheromatous arterial calcifications. Mild knee degenerative changes.  IMPRESSION: No fracture or dislocation.   Electronically Signed   By: Enrique Sack M.D.   On: 03/01/2014 00:04     EKG Interpretation None      MDM   Final diagnoses:  Leg hematoma, left, initial encounter  Fall, initial encounter  Skin tear of hand without complication, right, initial encounter    Patient has swelling to the posterior left thigh, and tenderness around the left hip. No shortening or rotation of the left lower extremity. Patient has a palpable hematoma in this area. Concern for hip fracture versus isolated hematoma secondary to anticoagulation therapy. Will obtain INR level, CBC, CMP. Will also obtain plain films of the pelvis and left femur.  No fractures noted on plain films. Right hand skin tear repaired at bedside with Steri-Strips. Patient's hemoglobin stable. Pain well-controlled, with no further extent of hematoma while in the emergency department. Patient appropriate for discharge with appropriate home pain medication. Advised  on strategies to reduce risk of falls, and precautions with using pain medication. Advised to follow up with PCP for repeat evaluation.  Patient and family given return precautions for falls, hematomas, and skin tears.  Advised  to return for worsening symptoms including chest pain, shortness of breath, severe headache, intractable nausea or vomiting, fever, or chills, inability to take medications, or other acute concerns.  Advised to follow up with PCP in 2-3 days.  Patient and family in agreement with and expressed understanding of follow plan, plan of care, and return precautions.  All questions answered prior to discharge.  Patient was discharged in stable condition with family, ambulating without difficulty.  Patient care was discussed with my attending, Dr. Alvino Chapel.    Hoyle Sauer, MD 03/02/14 [phone]

## 2014-02-28 NOTE — ED Notes (Addendum)
Pt arrive via EMS from home. Pt was walking in backyard and tripped and fell. Pt states she broke fall with hands. Pt has skin tear to left thumb. Small abrasion to left elbow. Pt c/o left hip pain 5/10. 164/74 90 18. Pt able to stand and pivot to bed with EMS. Pt is on coumadin. Pt did not loose concs

## 2014-03-01 ENCOUNTER — Other Ambulatory Visit: Payer: Self-pay

## 2014-03-01 DIAGNOSIS — S61519A Laceration without foreign body of unspecified wrist, initial encounter: Secondary | ICD-10-CM | POA: Diagnosis present

## 2014-03-01 MED ORDER — TRAMADOL HCL 50 MG PO TABS: 50.0000 mg | ORAL_TABLET | Freq: Four times a day (QID) | ORAL | Status: DC | PRN

## 2014-03-01 MED ORDER — FUROSEMIDE 40 MG PO TABS: 40.0000 mg | ORAL_TABLET | Freq: Every day | ORAL | Status: DC | PRN

## 2014-03-01 MED ORDER — ONDANSETRON HCL 4 MG PO TABS: 4.0000 mg | ORAL_TABLET | Freq: Four times a day (QID) | ORAL | Status: DC

## 2014-03-01 NOTE — ED Notes (Signed)
IV catheter not in place when patient returned from x-ray. It was pulled out and blood was on dressing and arm. Xray did not notify staff when she returned.

## 2014-03-02 NOTE — ED Provider Notes (Signed)
I saw and evaluated the patient, reviewed the resident's note and I agree with the findings and plan.   EKG Interpretation None     Patient with fall. Negative xrays. Hematoma will need to be watched. Will d/c  Jasper Riling. Alvino Chapel, MD 03/02/14 2251

## 2014-03-15 ENCOUNTER — Ambulatory Visit: Payer: Medicare HMO | Admitting: Cardiology

## 2014-03-16 ENCOUNTER — Emergency Department (HOSPITAL_COMMUNITY)
Admission: EM | Admit: 2014-03-16 | Discharge: 2014-03-16 | Disposition: A | Discharge status: Home / Self Care | Payer: Medicare HMO | Attending: Emergency Medicine | Admitting: Emergency Medicine

## 2014-03-16 ENCOUNTER — Emergency Department (HOSPITAL_COMMUNITY): Payer: Medicare HMO

## 2014-03-16 ENCOUNTER — Encounter (HOSPITAL_COMMUNITY): Payer: Self-pay | Admitting: Emergency Medicine

## 2014-03-16 DIAGNOSIS — Z880 Allergy status to penicillin: Secondary | ICD-10-CM | POA: Insufficient documentation

## 2014-03-16 DIAGNOSIS — Z85828 Personal history of other malignant neoplasm of skin: Secondary | ICD-10-CM | POA: Insufficient documentation

## 2014-03-16 DIAGNOSIS — I2510 Atherosclerotic heart disease of native coronary artery without angina pectoris: Secondary | ICD-10-CM | POA: Insufficient documentation

## 2014-03-16 DIAGNOSIS — Z7901 Long term (current) use of anticoagulants: Secondary | ICD-10-CM | POA: Diagnosis not present

## 2014-03-16 DIAGNOSIS — Z87891 Personal history of nicotine dependence: Secondary | ICD-10-CM | POA: Diagnosis not present

## 2014-03-16 DIAGNOSIS — I10 Essential (primary) hypertension: Secondary | ICD-10-CM | POA: Diagnosis not present

## 2014-03-16 DIAGNOSIS — Z8719 Personal history of other diseases of the digestive system: Secondary | ICD-10-CM | POA: Diagnosis not present

## 2014-03-16 DIAGNOSIS — Z8673 Personal history of transient ischemic attack (TIA), and cerebral infarction without residual deficits: Secondary | ICD-10-CM | POA: Diagnosis not present

## 2014-03-16 DIAGNOSIS — R6884 Jaw pain: Secondary | ICD-10-CM | POA: Insufficient documentation

## 2014-03-16 DIAGNOSIS — Z8669 Personal history of other diseases of the nervous system and sense organs: Secondary | ICD-10-CM | POA: Insufficient documentation

## 2014-03-16 DIAGNOSIS — R519 Headache, unspecified: Secondary | ICD-10-CM

## 2014-03-16 DIAGNOSIS — J45909 Unspecified asthma, uncomplicated: Secondary | ICD-10-CM | POA: Insufficient documentation

## 2014-03-16 DIAGNOSIS — IMO0002 Reserved for concepts with insufficient information to code with codable children: Secondary | ICD-10-CM | POA: Diagnosis not present

## 2014-03-16 DIAGNOSIS — Z79899 Other long term (current) drug therapy: Secondary | ICD-10-CM | POA: Insufficient documentation

## 2014-03-16 DIAGNOSIS — R51 Headache: Secondary | ICD-10-CM | POA: Insufficient documentation

## 2014-03-16 DIAGNOSIS — I4891 Unspecified atrial fibrillation: Secondary | ICD-10-CM | POA: Insufficient documentation

## 2014-03-16 DIAGNOSIS — Z9861 Coronary angioplasty status: Secondary | ICD-10-CM | POA: Diagnosis not present

## 2014-03-16 LAB — CBC WITH DIFFERENTIAL/PLATELET
Basophils Absolute: 0 10*3/uL (ref 0.0–0.1)
Basophils Relative: 1 % (ref 0–1)
Eosinophils Absolute: 0.2 10*3/uL (ref 0.0–0.7)
Eosinophils Relative: 3 % (ref 0–5)
HCT: 34.3 % — ABNORMAL LOW (ref 36.0–46.0)
Hemoglobin: 11.4 g/dL — ABNORMAL LOW (ref 12.0–15.0)
Lymphocytes Relative: 20 % (ref 12–46)
Lymphs Abs: 1.3 10*3/uL (ref 0.7–4.0)
MCH: 29.2 pg (ref 26.0–34.0)
MCHC: 33.2 g/dL (ref 30.0–36.0)
MCV: 87.7 fL (ref 78.0–100.0)
Monocytes Absolute: 0.4 10*3/uL (ref 0.1–1.0)
Monocytes Relative: 6 % (ref 3–12)
Neutro Abs: 4.8 10*3/uL (ref 1.7–7.7)
Neutrophils Relative %: 70 % (ref 43–77)
Platelets: 229 10*3/uL (ref 150–400)
RBC: 3.91 MIL/uL (ref 3.87–5.11)
RDW: 15.8 % — ABNORMAL HIGH (ref 11.5–15.5)
WBC: 6.7 10*3/uL (ref 4.0–10.5)

## 2014-03-16 LAB — BASIC METABOLIC PANEL
Anion gap: 11 (ref 5–15)
BUN: 11 mg/dL (ref 6–23)
CO2: 24 mEq/L (ref 19–32)
Calcium: 8.8 mg/dL (ref 8.4–10.5)
Chloride: 105 mEq/L (ref 96–112)
Creatinine, Ser: 0.75 mg/dL (ref 0.50–1.10)
GFR calc Af Amer: 85 mL/min — ABNORMAL LOW (ref >90)
GFR calc non Af Amer: 73 mL/min — ABNORMAL LOW (ref >90)
Glucose, Bld: 104 mg/dL — ABNORMAL HIGH (ref 70–99)
Potassium: 3.8 mEq/L (ref 3.7–5.3)
Sodium: 140 mEq/L (ref 137–147)

## 2014-03-16 LAB — PROTIME-INR
INR: 1.92 — ABNORMAL HIGH (ref 0.00–1.49)
Prothrombin Time: 22 s — ABNORMAL HIGH (ref 11.6–15.2)

## 2014-03-16 LAB — SEDIMENTATION RATE: Sed Rate: 57 mm/h — ABNORMAL HIGH (ref 0–22)

## 2014-03-16 MED ORDER — LORAZEPAM 1 MG PO TABS
1.0000 mg | ORAL_TABLET | Freq: Once | ORAL | Status: AC
  Administered 2014-03-16: 1 mg via ORAL
  Filled 2014-03-16: qty 1

## 2014-03-16 MED ORDER — LORATADINE 10 MG PO TABS: 10.0000 mg | ORAL_TABLET | Freq: Every day | ORAL | Status: DC

## 2014-03-16 MED ORDER — TETRACAINE HCL 0.5 % OP SOLN
2.0000 [drp] | Freq: Once | OPHTHALMIC | Status: AC
  Administered 2014-03-16: 2 [drp] via OPHTHALMIC
  Filled 2014-03-16: qty 2

## 2014-03-16 MED ORDER — PREDNISONE 50 MG PO TABS: 50.0000 mg | ORAL_TABLET | Freq: Every day | ORAL | Status: DC

## 2014-03-16 MED ORDER — PREDNISONE 20 MG PO TABS
60.0000 mg | ORAL_TABLET | Freq: Once | ORAL | Status: AC
  Administered 2014-03-16: 60 mg via ORAL
  Filled 2014-03-16: qty 3

## 2014-03-16 NOTE — ED Notes (Signed)
Pt resting feels much better

## 2014-03-16 NOTE — ED Provider Notes (Signed)
CSN: [id number]     Arrival date & time 03/16/14  0708 History   First MD Initiated Contact with Patient 03/16/14 [phone]     Chief Complaint  Patient presents with  . Jaw Pain     (Consider location/radiation/quality/duration/timing/severity/associated sxs/prior Treatment) HPI  89yF with L sided HA/facial pain. Gradually worsening over the past week. Pt did have a fairly recent fall on 8/28, but most of current symptoms didn't occur until about a week after this. Describes L temporal, eye, jaw pressure. Hesitant to actually call it painful. Blurred vision in L eye. Blurred vision in eye actually going back as far as a couple months ago. Intermittent, but feels like slowly worsening. Hx of cataracts s/p surgery and glaucoma, but reports this blurred vision is fairly new. General malaise, body aches and "I just don't feel good." Intermittent subjective fever. No rash. No scalp tenderness when washes/brushes hair. No neck pain. No acute, numbness, tingling or focal loss of strength.   Past Medical History  Diagnosis Date  . Esophageal reflux   . Glaucoma   . Diverticulosis   . PAF (paroxysmal atrial fibrillation)     intolerant to beta blockers due to asthma and intolerant to CCB due to rash  . HTN (hypertension), benign   . Asthma     copd  . CAD (coronary artery disease) 2007    s/p PCI of diagonal  . Hyperlipidemia   . Stroke 02/2011    TIA  . A-fib   . Malignant melanoma     remove 09/2012   Past Surgical History  Procedure Laterality Date  . Appendectomy    . Vesicovaginal fistula closure w/ tah    . Nasal sinus surgery    . Cholecystectomy    . Coronary angioplasty with stent placement    . Abdominal hysterectomy    . Eye surgery     Family History  Problem Relation Age of Onset  . Heart disease Father   . Hemachromatosis Father   . Colon cancer Mother   . Peripheral vascular disease Mother   . Peripheral vascular disease Sister   . Lymphoma Sister   . Heart attack  Brother   . Heart disease Brother    History  Substance Use Topics  . Smoking status: Former Smoker -- 0.20 packs/day for 25 years    Types: Cigarettes    Quit date: 07/06/1966  . Smokeless tobacco: Not on file  . Alcohol Use: No   OB History   Grav Para Term Preterm Abortions TAB SAB Ect Mult Living                 Review of Systems  All systems reviewed and negative, other than as noted in HPI.   Allergies  Brovana; Budesonide; Cephalexin; Clonidine derivatives; Iohexol; Penicillins; Sulfonamide derivatives; Tetracycline; Vancomycin; and Cardizem  Home Medications   Prior to Admission medications   Medication Sig Start Date End Date Taking? Authorizing Provider  acetaminophen (TYLENOL) 325 MG tablet Take 325 mg by mouth every 6 (six) hours as needed for moderate pain.     Historical Provider, MD  albuterol (PROVENTIL HFA;VENTOLIN HFA) 108 (90 BASE) MCG/ACT inhaler Inhale 1 puff into the lungs every 4 (four) hours as needed for wheezing or shortness of breath. 05/11/13   Kathee Delton, MD  ALPRAZolam Duanne Moron) 0.5 MG tablet Take 1 tablet (0.5 mg total) by mouth 2 (two) times daily as needed for anxiety. 08/05/13   Saddie Benders. Dorna Mai, MD  amLODipine-valsartan (  EXFORGE) 10-320 MG per tablet Take 1 tablet by mouth daily.    Historical Provider, MD  cetirizine (ZYRTEC) 10 MG tablet Take 10 mg by mouth daily as needed for allergies.    Historical Provider, MD  digoxin (LANOXIN) 0.125 MG tablet Take 0.125 mg by mouth daily.    Historical Provider, MD  fluticasone (FLONASE) 50 MCG/ACT nasal spray Place 1 spray into both nostrils daily as needed for allergies or rhinitis.    Historical Provider, MD  Fluticasone-Salmeterol (ADVAIR) 250-50 MCG/DOSE AEPB Inhale 1 puff into the lungs 2 (two) times daily. 05/11/13   Kathee Delton, MD  furosemide (LASIX) 40 MG tablet Take 1 tablet (40 mg total) by mouth daily as needed for fluid. 03/01/14   Sueanne Margarita, MD  latanoprost (XALATAN) 0.005 %  ophthalmic solution Place 1 drop into both eyes at bedtime.    Historical Provider, MD  ondansetron (ZOFRAN) 4 MG tablet Take 1 tablet (4 mg total) by mouth every 6 (six) hours. 03/01/14   Hoyle Sauer, MD  simvastatin (ZOCOR) 5 MG tablet Take 1 tablet (5 mg total) by mouth at bedtime. 02/09/14   Sueanne Margarita, MD  sodium chloride (OCEAN) 0.65 % nasal spray Place 1 spray into the nose as needed for congestion.    Historical Provider, MD  traMADol (ULTRAM) 50 MG tablet Take 1 tablet (50 mg total) by mouth every 6 (six) hours as needed. 03/01/14   Hoyle Sauer, MD  warfarin (COUMADIN) 5 MG tablet Take 2.5-3.75 mg by mouth See admin instructions. Takes half tablet (2.13m) on Monday and Friday Takes 3/4 tablet (2.758m the rest of the week    Historical Provider, MD   BP 106/69  Pulse 98  Temp(Src) 98.2 F (36.8 C) (Oral)  Resp 16  SpO2 98% Physical Exam  Nursing note and vitals reviewed. Constitutional: She is oriented to person, place, and time. She appears well-developed and well-nourished. No distress.  HENT:  Palpable temporal pulses bilaterally. Seem fairly symmetric. No localized tenderness.  Eyes: Pupils are equal, round, and reactive to light. Right eye exhibits no discharge. Left eye exhibits no discharge.  Pupils slightly irregular. Evidence of previous eye surgery. Anterior chambers clear. Extraocular muscles are intact. IOP OS 23,23,25  Neck: Neck supple.  Cardiovascular: Normal rate, regular rhythm and normal heart sounds.  Exam reveals no gallop and no friction rub.   No murmur heard. Pulmonary/Chest: Effort normal and breath sounds normal. No respiratory distress.  Abdominal: Soft. She exhibits no distension. There is no tenderness.  Musculoskeletal: She exhibits no edema and no tenderness.  Neurological: She is alert and oriented to person, place, and time. No cranial nerve deficit. She exhibits normal muscle tone. Coordination normal.  Skin: Skin is warm and dry.   Psychiatric: She has a normal mood and affect. Her behavior is normal. Thought content normal.    ED Course  Procedures (including critical care time) Labs Review Labs Reviewed  SEDIMENTATION RATE - Abnormal; Notable for the following:    Sed Rate 57 (*)    All other components within normal limits  CBC WITH DIFFERENTIAL - Abnormal; Notable for the following:    Hemoglobin 11.4 (*)    HCT 34.3 (*)    RDW 15.8 (*)    All other components within normal limits  BASIC METABOLIC PANEL - Abnormal; Notable for the following:    Glucose, Bld 104 (*)    GFR calc non Af Amer 73 (*)    GFR calc Af AmWyvonnia Lora  85 (*)    All other components within normal limits  PROTIME-INR - Abnormal; Notable for the following:    Prothrombin Time 22.0 (*)    INR 1.92 (*)    All other components within normal limits    Imaging Review Ct Head Wo Contrast  03/16/2014   CLINICAL DATA:  Left-sided headache  EXAM: CT HEAD WITHOUT CONTRAST  TECHNIQUE: Contiguous axial images were obtained from the base of the skull through the vertex without intravenous contrast.  COMPARISON:  02/23/2011  FINDINGS: The bony calvarium is intact. There are soft tissue changes within the sphenoid sinus bilaterally. Portion of this may be related to acute sinusitis. Mild atrophic changes are seen. No findings to suggest acute hemorrhage, acute infarction or space-occupying mass lesion is identified.  IMPRESSION: No acute intracranial abnormality is noted.  Changes in the sphenoid sinus which may represent acute on chronic sinusitis.   Electronically Signed   By: Inez Catalina M.D.   On: 03/16/2014 08:16     EKG Interpretation None      MDM   Final diagnoses:  Left temporal headache    89yf with L sided HA/facial pain/pressure. I'm concerned she may have temporal arteritis. Advanced age/female. Unilateral HA in temporal region/L face. Jaw pain but doesn't necessarily describe claudication. Blurred vision in L eye. Body aches/malaise.  Intermittent subjective fever. Etc. Temporal arteries easily palpable though and denies tenderness.  Possibly sinusitis. Could also potentially be glaucoma.   I don't have a obvious explanation otherwise. Will check ESR. Check IOP.  No trauma. Nonfocal neuro exam. Is on coumadin for hx of afib. Probably low yield but with age and current warfarin use, will CT head to eval for potential bleed or space occupying lesion. Doubt meningitis. Doubt carotid/vertebral artery dissection.   CT with evidence of sphenoid sinusitis. Could potentially explain symptoms, but not blurred vision.   . Is mildly elevated but this is known and, per pt report, is unchanged. Reports followed by opthalmology and previously told IOP OD was 19 and OS was 23. Consistent with my readings today. I don't think this is cause for presenting symptoms. Discussed need to follow back up with opthalmology though.  ESR is elevated although not dramatically. ESR >50 with her age, unilateral HA/facial pressure and visual complaints is concerning enough to start on steroids for possible temporal/giant cell arteritis. Needs surgical FU for likely temporal artery biopsy. Pt previously worked in [place]. Needs either general surgery or vascular follow-up. Will leave to her discretion, but understands need to take steroids and arrange prompt follow-up.  Virgel Manifold, MD 03/21/14 [phone]

## 2014-03-19 ENCOUNTER — Encounter: Payer: Self-pay | Admitting: Gastroenterology

## 2014-03-19 ENCOUNTER — Telehealth: Payer: Self-pay | Admitting: Cardiology

## 2014-03-19 NOTE — Telephone Encounter (Signed)
Spoke with pt. She needed to have PCP to give referral.

## 2014-03-19 NOTE — Telephone Encounter (Signed)
R/s pt for missed appt with Dr Radford Pax

## 2014-03-19 NOTE — Telephone Encounter (Signed)
New message     Talk to Andee Poles about a referral.  Please call after 2pm

## 2014-03-22 ENCOUNTER — Ambulatory Visit (INDEPENDENT_AMBULATORY_CARE_PROVIDER_SITE_OTHER): Payer: Medicare HMO | Admitting: Cardiology

## 2014-03-22 ENCOUNTER — Encounter: Payer: Self-pay | Admitting: Cardiology

## 2014-03-22 VITALS — BP 158/68 | HR 97 | Ht 68.0 in | Wt 141.0 lb

## 2014-03-22 DIAGNOSIS — E785 Hyperlipidemia, unspecified: Secondary | ICD-10-CM

## 2014-03-22 DIAGNOSIS — I4891 Unspecified atrial fibrillation: Secondary | ICD-10-CM

## 2014-03-22 DIAGNOSIS — I2510 Atherosclerotic heart disease of native coronary artery without angina pectoris: Secondary | ICD-10-CM

## 2014-03-22 DIAGNOSIS — R011 Cardiac murmur, unspecified: Secondary | ICD-10-CM

## 2014-03-22 DIAGNOSIS — Z7901 Long term (current) use of anticoagulants: Secondary | ICD-10-CM

## 2014-03-22 DIAGNOSIS — I10 Essential (primary) hypertension: Secondary | ICD-10-CM

## 2014-03-22 DIAGNOSIS — I480 Paroxysmal atrial fibrillation: Secondary | ICD-10-CM

## 2014-03-22 DIAGNOSIS — M316 Other giant cell arteritis: Secondary | ICD-10-CM

## 2014-03-22 NOTE — Progress Notes (Signed)
[facility], [place], [place]  [address] Phone: [phone] Fax:  [phone]  Date:  03/22/2014   ID:  JEILY GUTHRIDGE, DOB 02-01-1924, MRN [id number]  PCP:  Florina Ou, MD  Cardiologist:  Fransico Him, MD     History of Present Illness: [name] is a 80 y.o. female with a history of ASCAD/PAF/HTN/dyslipidemia who presents today for followup. She is doing well. She denies any chest pain, LE edema, dizziness or syncope. She has chronic SOB due to asthma which is stable. She has chronic LE edema which is stable.  She occasionally has a palpitation but nothing sustained.  Since I saw her last she has been diagnosed with temporal arteritis.  She has been on steroids since last Friday and is supposed to be referred to vascular surgery but she has not been able to get a referral  Wt Readings from Last 3 Encounters:  03/22/14 141 lb (63.957 kg)  02/28/14 143 lb (64.864 kg)  12/05/13 143 lb 6.4 oz (65.046 kg)     Past Medical History  Diagnosis Date  . Esophageal reflux   . Glaucoma   . Diverticulosis   . PAF (paroxysmal atrial fibrillation)     intolerant to beta blockers due to asthma and intolerant to CCB due to rash  . HTN (hypertension), benign   . Asthma     copd  . CAD (coronary artery disease) 2007    s/p PCI of diagonal  . Hyperlipidemia   . Stroke 02/2011    TIA  . A-fib   . Malignant melanoma     remove 09/2012    Current Outpatient Prescriptions  Medication Sig Dispense Refill  . acetaminophen (TYLENOL) 325 MG tablet Take 325 mg by mouth every 6 (six) hours as needed for moderate pain.       [name] albuterol (PROVENTIL HFA;VENTOLIN HFA) 108 (90 BASE) MCG/ACT inhaler Inhale 1 puff into the lungs every 4 (four) hours as needed for wheezing or shortness of breath.      . ALPRAZolam (XANAX) 0.5 MG tablet Take 0.25-0.5 mg by mouth daily as needed for anxiety.      [name] amLODipine-valsartan (EXFORGE) 10-320 MG per tablet Take 1 tablet by mouth  daily.      . cetirizine (ZYRTEC) 10 MG tablet Take 10 mg by mouth daily as needed for allergies.      [name] digoxin (LANOXIN) 0.125 MG tablet Take 0.125 mg by mouth daily.      . fluticasone (FLONASE) 50 MCG/ACT nasal spray Place 1 spray into both nostrils daily as needed for allergies or rhinitis.      . Fluticasone-Salmeterol (ADVAIR) 250-50 MCG/DOSE AEPB Inhale 1 puff into the lungs 2 (two) times daily.      . furosemide (LASIX) 40 MG tablet Take 1 tablet (40 mg total) by mouth daily as needed for fluid.  30 tablet  1  . latanoprost (XALATAN) 0.005 % ophthalmic solution Place 1 drop into both eyes at bedtime.      [name] loratadine (CLARITIN) 10 MG tablet Take 1 tablet (10 mg total) by mouth daily.  30 tablet  0  . predniSONE (DELTASONE) 50 MG tablet Take 1 tablet (50 mg total) by mouth daily.  10 tablet  0  . traMADol (ULTRAM) 50 MG tablet       . warfarin (COUMADIN) 5 MG tablet Take 2.5-3.75 mg by mouth See admin instructions. Takes half tablet (2.5mg ) on Monday and Friday Takes 3/4 tablet (2.75mg )  the rest of the week       No current facility-administered medications for this visit.    Allergies:    Allergies  Allergen Reactions  . Alphagan [Brimonidine] Other (See Comments)    Burning sensation  . Biaxin [Clarithromycin]   . Brovana [Arformoterol] Other (See Comments)    Makes my heart race  . Budesonide Other (See Comments)    Makes my heart race  . Cefdinir     "FEEL HORRIBLE"  . Cephalexin Itching  . Clonidine Derivatives Other (See Comments)    REACTION: unknown  . Iohexol Other (See Comments)     Desc: NOTES FROM PRIOR CT STATES PRE MEDICATION PRIOR TO OMNIPAQUE ENHANCED CT   . Nsaids   . Penicillins Itching and Swelling    Swelling to ears  . Sulfamethoxazole   . Sulfonamide Derivatives Itching  . Tetracycline Itching  . Vancomycin Other (See Comments)    Turn red  . Zetia [Ezetimibe]   . Cardizem [Diltiazem Hcl] Rash    Social History:  The patient  reports that  she quit smoking about 47 years ago. Her smoking use included Cigarettes. She has a 5 pack-year smoking history. She does not have any smokeless tobacco history on file. She reports that she does not drink alcohol or use illicit drugs.   Family History:  The patient's family history includes Colon cancer in her mother; Heart attack in her brother; Heart disease in her brother and father; Hemachromatosis in her father; Lymphoma in her sister; Peripheral vascular disease in her mother and sister.   ROS:  Please see the history of present illness.      All other systems reviewed and negative.   PHYSICAL EXAM: VS:  BP 158/68  Pulse 97  Ht 5\' 8"  (1.727 m)  Wt 141 lb (63.957 kg)  BMI 21.44 kg/m2  SpO2 98% Well nourished, well developed, in no acute distress HEENT: normal Neck: no JVD Cardiac:  normal S1, S2; RRR; 2/6 SM at RUSB to LLSB murmur Lungs:  clear to auscultation bilaterally, no wheezing, rhonchi or rales Abd: soft, nontender, no hepatomegaly Ext: no edema Skin: warm and dry Neuro:  CNs 2-12 intact, no focal abnormalities noted  ASSESSMENT AND PLAN:  1. ASCAD with no angina 2. HTN - not well controlled - continue amlodipine/Valsartan  - check BP daily for a week and call with results 3. Dyslipidemia - at goal - recheck fasting lipids/ALT  4. PAF maintaining NSR - continue Warfarin and lanoxin  - check dig level       5. Heart murmur - check 2D echo       6.  Temporal arteritis - I will go ahead and refer her to Vascular surgery  Followup with me in 6 months    Signed, Fransico Him, MD 03/22/2014 3:59 PM

## 2014-03-22 NOTE — Patient Instructions (Addendum)
Your physician recommends that you continue on your current medications as directed. Please refer to the Current Medication list given to you today.  Your physician has requested that you have an echocardiogram. Echocardiography is a painless test that uses sound waves to create images of your heart. It provides your doctor with information about the size and shape of your heart and how well your heart's chambers and valves are working. This procedure takes approximately one hour. There are no restrictions for this procedure.  You have been referred to Vascular and Vein Specialist Dr Scot Dock: Check out will get an appointment scheduled for you. Address: [address] [place], [place] [address]  [phone]   Your physician recommends that you return for a FASTING lipid profile and ALT and digoxin level on 05/25/14  Your physician wants you to follow-up in: 6 months with Dr Mallie Snooks will receive a reminder letter in the mail two months in advance. If you don't receive a letter, please call our office to schedule the follow-up appointment.

## 2014-03-23 ENCOUNTER — Telehealth: Payer: Self-pay | Admitting: Cardiology

## 2014-03-23 NOTE — Telephone Encounter (Signed)
Pt is Aware

## 2014-03-23 NOTE — Telephone Encounter (Signed)
She needs to get the PCP who is taking over for her old MD to do this or go to Urgent Care - I do not treat Temporal arteritis

## 2014-03-23 NOTE — Telephone Encounter (Signed)
TO Dr Radford Pax to advise?

## 2014-03-23 NOTE — Telephone Encounter (Signed)
New message      Will Dr Radford Pax refill her prednisone.  She was in the ER 1wk ago today.  She states the vascular surgeon has not called her for an appt yet.  She will run out of the prednisone on Monday.  Please call

## 2014-04-03 ENCOUNTER — Ambulatory Visit (HOSPITAL_COMMUNITY): Payer: Medicare HMO | Attending: Cardiology | Admitting: Radiology

## 2014-04-03 DIAGNOSIS — I4891 Unspecified atrial fibrillation: Secondary | ICD-10-CM | POA: Diagnosis not present

## 2014-04-03 DIAGNOSIS — R011 Cardiac murmur, unspecified: Secondary | ICD-10-CM | POA: Diagnosis not present

## 2014-04-03 DIAGNOSIS — I10 Essential (primary) hypertension: Secondary | ICD-10-CM | POA: Diagnosis not present

## 2014-04-03 DIAGNOSIS — I2510 Atherosclerotic heart disease of native coronary artery without angina pectoris: Secondary | ICD-10-CM | POA: Diagnosis not present

## 2014-04-03 DIAGNOSIS — E785 Hyperlipidemia, unspecified: Secondary | ICD-10-CM | POA: Diagnosis not present

## 2014-04-03 DIAGNOSIS — Z8673 Personal history of transient ischemic attack (TIA), and cerebral infarction without residual deficits: Secondary | ICD-10-CM | POA: Insufficient documentation

## 2014-04-03 NOTE — Progress Notes (Signed)
Echocardiogram performed.  

## 2014-04-10 ENCOUNTER — Encounter: Payer: Self-pay | Admitting: Vascular Surgery

## 2014-04-11 ENCOUNTER — Encounter: Payer: Self-pay | Admitting: Vascular Surgery

## 2014-04-11 ENCOUNTER — Ambulatory Visit (INDEPENDENT_AMBULATORY_CARE_PROVIDER_SITE_OTHER): Payer: Medicare HMO | Admitting: Vascular Surgery

## 2014-04-11 VITALS — BP 147/55 | HR 65 | Ht 68.0 in | Wt 141.4 lb

## 2014-04-11 DIAGNOSIS — M316 Other giant cell arteritis: Secondary | ICD-10-CM

## 2014-04-11 NOTE — Assessment & Plan Note (Signed)
The patient is scheduled to see Dr. Hurley Cisco in order to be evaluated for possible temporal arteritis. She has been on steroids since early September and at this point I will wait Dr. Elmon Else opinion on the utility of temporal artery biopsy. Certainly if he feels that this is indicated we will stop her Coumadin and arrange for the biopsy in the latter part of next week. Over this point I'm not totally convinced that she has temporal arteritis and therefore we'll not schedule a biopsy until she is evaluated by rheumatology. I have discussed the procedure and potential complications with the patient if the biopsy is necessary.

## 2014-04-11 NOTE — Progress Notes (Signed)
Patient ID: [name], female   DOB: 1924/06/30, 80 y.o.   MRN: [id number]  Reason for Consult:  Possible temporal arteritis  Referred by Dr. Fransico Him  Subjective:     HPI:  [name] is a 80 y.o. female who was seen in the emergency department with some visual disturbances and a mild headache. It was felt that the patient might potentially have temporal arteritis and therefore she was started on steroids by the emergency department. This was on September 7. She has an appointment to see a rheumatologist next week. She was also set up for vascular consultation.  She states that her headache has resolved. She occasionally has some blurred vision. She has no upper extremity pain or paresthesias. She has no history of stroke, TIAs, expressive or receptive aphasia, or amaurosis fugax.  Past Medical History  Diagnosis Date  . Esophageal reflux   . Glaucoma   . Diverticulosis   . PAF (paroxysmal atrial fibrillation)     intolerant to beta blockers due to asthma and intolerant to CCB due to rash  . HTN (hypertension), benign   . Asthma     copd  . CAD (coronary artery disease) 2007    s/p PCI of diagonal  . Hyperlipidemia   . Stroke 02/2011    TIA  . A-fib   . Malignant melanoma     remove 09/2012  . Atrial fibrillation    Family History  Problem Relation Age of Onset  . Heart disease Father   . Hemachromatosis Father   . Heart attack Father   . Colon cancer Mother   . Peripheral vascular disease Mother   . Peripheral vascular disease Sister   . Cancer Sister   . Lymphoma Sister   . Heart attack Brother   . Heart disease Brother    Past Surgical History  Procedure Laterality Date  . Appendectomy    . Vesicovaginal fistula closure w/ tah    . Nasal sinus surgery    . Cholecystectomy    . Coronary angioplasty with stent placement    . Abdominal hysterectomy    . Eye surgery     Short Social History:  History  Substance Use Topics  .  Smoking status: Former Smoker -- 0.20 packs/day for 25 years    Types: Cigarettes    Quit date: 07/06/1966  . Smokeless tobacco: Not on file  . Alcohol Use: No   Allergies  Allergen Reactions  . Alphagan [Brimonidine] Other (See Comments)    Burning sensation  . Biaxin [Clarithromycin]   . Brovana [Arformoterol] Other (See Comments)    Makes my heart race  . Budesonide Other (See Comments)    Makes my heart race  . Cefdinir     "FEEL HORRIBLE"  . Cephalexin Itching  . Clonidine Derivatives Other (See Comments)    REACTION: unknown  . Iohexol Other (See Comments)     Desc: NOTES FROM PRIOR CT STATES PRE MEDICATION PRIOR TO OMNIPAQUE ENHANCED CT   . Nsaids   . Penicillins Itching and Swelling    Swelling to ears  . Sulfamethoxazole   . Sulfonamide Derivatives Itching  . Tetracycline Itching  . Vancomycin Other (See Comments)    Turn red  . Zetia [Ezetimibe]   . Cardizem [Diltiazem Hcl] Rash    Current Outpatient Prescriptions  Medication Sig Dispense Refill  . acetaminophen (TYLENOL) 325 MG tablet Take 325 mg by mouth every 6 (six) hours as needed for moderate pain.       [name]  albuterol (PROVENTIL HFA;VENTOLIN HFA) 108 (90 BASE) MCG/ACT inhaler Inhale 1 puff into the lungs every 4 (four) hours as needed for wheezing or shortness of breath.      . ALPRAZolam (XANAX) 0.5 MG tablet Take 0.25-0.5 mg by mouth daily as needed for anxiety.      [name] amLODipine-valsartan (EXFORGE) 10-320 MG per tablet Take 1 tablet by mouth daily.      . cetirizine (ZYRTEC) 10 MG tablet Take 10 mg by mouth daily as needed for allergies.      [name] digoxin (LANOXIN) 0.125 MG tablet Take 0.125 mg by mouth daily.      . fluticasone (FLONASE) 50 MCG/ACT nasal spray Place 1 spray into both nostrils daily as needed for allergies or rhinitis.      . Fluticasone-Salmeterol (ADVAIR) 250-50 MCG/DOSE AEPB Inhale 1 puff into the lungs 2 (two) times daily.      . furosemide (LASIX) 40 MG tablet Take 1 tablet (40 mg  total) by mouth daily as needed for fluid.  30 tablet  1  . latanoprost (XALATAN) 0.005 % ophthalmic solution Place 1 drop into both eyes at bedtime.      . predniSONE (DELTASONE) 50 MG tablet Take 1 tablet (50 mg total) by mouth daily.  10 tablet  0  . traMADol (ULTRAM) 50 MG tablet       . warfarin (COUMADIN) 5 MG tablet Take 2.5-3.75 mg by mouth See admin instructions. Takes half tablet (2.5mg ) on Monday and Friday Takes 3/4 tablet (2.75mg ) the rest of the week      . loratadine (CLARITIN) 10 MG tablet Take 1 tablet (10 mg total) by mouth daily.  30 tablet  0   No current facility-administered medications for this visit.   Review of Systems  Constitutional: Negative for chills and fever.  Eyes: Negative for loss of vision.  Respiratory: Negative for cough and wheezing.  Cardiovascular: Negative for chest pain, chest tightness, claudication, dyspnea with exertion, orthopnea and palpitations.  GI: Negative for blood in stool and vomiting.  GU: Negative for dysuria and hematuria.  Musculoskeletal: Negative for leg pain, joint pain and myalgias.  Skin: Negative for rash and wound.  Neurological: Negative for dizziness and speech difficulty.  Hematologic: Negative for bruises/bleeds easily. Psychiatric: Negative for depressed mood.        Objective:  Objective  Filed Vitals:   04/11/14 1049  BP: 147/55  Pulse: 65  Height: 5\' 8"  (1.727 m)  Weight: 141 lb 6.4 oz (64.139 kg)  SpO2: 100%   Body mass index is 21.5 kg/(m^2).  Physical Exam  Constitutional: She is oriented to person, place, and time. She appears well-developed and well-nourished.  HENT:  Head: Normocephalic and atraumatic.  Neck: Neck supple. No JVD present. No thyromegaly present.  Cardiovascular: Normal rate, regular rhythm and normal heart sounds.  Exam reveals no friction rub.   No murmur heard. I do not detect carotid bruits. She has palpable temporal artery pulses.  Pulmonary/Chest: Breath sounds normal.  She has no wheezes. She has no rales.  Abdominal: Soft. Bowel sounds are normal. There is no tenderness.  Musculoskeletal: Normal range of motion. She exhibits no edema.  Lymphadenopathy:    She has no cervical adenopathy.  Neurological: She is alert and oriented to person, place, and time. She has normal strength. No sensory deficit.  Skin: No lesion and no rash noted.  Psychiatric: She has a normal mood and affect.   Data: I have reviewed her records from Dr. Theodosia Blender  office. She does have a history of atrial fibrillation and is on Coumadin.     Assessment/Plan:     Temporal arteritis The patient is scheduled to see Dr. Hurley Cisco in order to be evaluated for possible temporal arteritis. She has been on steroids since early September and at this point I will wait Dr. Elmon Else opinion on the utility of temporal artery biopsy. Certainly if he feels that this is indicated we will stop her Coumadin and arrange for the biopsy in the latter part of next week. Over this point I'm not totally convinced that she has temporal arteritis and therefore we'll not schedule a biopsy until she is evaluated by rheumatology. I have discussed the procedure and potential complications with the patient if the biopsy is necessary.    Angelia Mould MD [facility]

## 2014-04-27 ENCOUNTER — Other Ambulatory Visit: Payer: Self-pay | Admitting: *Deleted

## 2014-04-27 MED ORDER — DIGOXIN 125 MCG PO TABS: 0.1250 mg | ORAL_TABLET | Freq: Every day | ORAL | Status: DC

## 2014-04-27 MED ORDER — AMLODIPINE BESYLATE-VALSARTAN 10-320 MG PO TABS: 1.0000 | ORAL_TABLET | Freq: Every day | ORAL | Status: DC

## 2014-05-17 ENCOUNTER — Other Ambulatory Visit: Payer: Self-pay | Admitting: Pulmonary Disease

## 2014-05-21 ENCOUNTER — Telehealth: Payer: Self-pay | Admitting: Pulmonary Disease

## 2014-05-21 NOTE — Telephone Encounter (Signed)
Called and spoke to pt. Pt requesting to come in to have flu shot. Appt made on injection schedule for 11/18 at 4p. Pt verbalized understanding and denied any further questions or concerns at this time.

## 2014-05-23 ENCOUNTER — Ambulatory Visit: Payer: Medicare HMO

## 2014-05-25 ENCOUNTER — Other Ambulatory Visit (INDEPENDENT_AMBULATORY_CARE_PROVIDER_SITE_OTHER): Payer: Medicare HMO | Admitting: *Deleted

## 2014-05-25 DIAGNOSIS — Z79899 Other long term (current) drug therapy: Secondary | ICD-10-CM

## 2014-05-25 DIAGNOSIS — I480 Paroxysmal atrial fibrillation: Secondary | ICD-10-CM

## 2014-05-25 DIAGNOSIS — E785 Hyperlipidemia, unspecified: Secondary | ICD-10-CM

## 2014-05-25 LAB — LIPID PANEL
CHOL/HDL RATIO: 3
Cholesterol: 166 mg/dL (ref 0–200)
HDL: 60 mg/dL (ref >39.00)
LDL Cholesterol: 85 mg/dL (ref 0–99)
NONHDL: 106
Triglycerides: 106 mg/dL (ref 0.0–149.0)
VLDL: 21.2 mg/dL (ref 0.0–40.0)

## 2014-05-25 LAB — ALT: ALT: 16 U/L (ref 0–35)

## 2014-05-26 LAB — DIGOXIN LEVEL: Digoxin Level: 0.8 ng/mL (ref 0.8–2.0)

## 2014-05-28 ENCOUNTER — Telehealth: Payer: Self-pay

## 2014-05-28 NOTE — Telephone Encounter (Signed)
Patient st she takes 5 mg simvastatin daily.   Medication added to EPIC med list.

## 2014-05-28 NOTE — Telephone Encounter (Signed)
-----   Message from Sueanne Margarita, MD sent at 05/25/2014  2:37 PM EST ----- Please find out if patient is still on low dose simvastatin

## 2014-05-30 ENCOUNTER — Telehealth: Payer: Self-pay

## 2014-05-30 DIAGNOSIS — E785 Hyperlipidemia, unspecified: Secondary | ICD-10-CM

## 2014-05-30 NOTE — Telephone Encounter (Signed)
-----   Message from Sueanne Margarita, MD sent at 05/29/2014  8:42 PM EST ----- Please refer patient to lipid clinic

## 2014-05-30 NOTE — Telephone Encounter (Signed)
Patient informed she has been referred to [facility].  Order placed and sent to [facility] for scheduling.

## 2014-06-05 ENCOUNTER — Ambulatory Visit (INDEPENDENT_AMBULATORY_CARE_PROVIDER_SITE_OTHER): Payer: Medicare HMO | Admitting: Pulmonary Disease

## 2014-06-05 ENCOUNTER — Encounter: Payer: Self-pay | Admitting: Pulmonary Disease

## 2014-06-05 VITALS — BP 122/70 | HR 93 | Temp 97.0°F | Ht 67.0 in | Wt 147.8 lb

## 2014-06-05 DIAGNOSIS — J449 Chronic obstructive pulmonary disease, unspecified: Secondary | ICD-10-CM

## 2014-06-05 DIAGNOSIS — Z23 Encounter for immunization: Secondary | ICD-10-CM

## 2014-06-05 MED ORDER — FLUTICASONE PROPIONATE 50 MCG/ACT NA SUSP: 1.0000 | Freq: Every day | NASAL | Status: DC | PRN

## 2014-06-05 NOTE — Patient Instructions (Signed)
Continue on your current asthma medications, and will renew your prescription for your nasal spray Will give you a flu shot today . followup with me again in 6 mos

## 2014-06-05 NOTE — Progress Notes (Signed)
   Subjective:    Patient ID: [name], female    DOB: 1923/10/11, 80 y.o.   MRN: [id number]  HPI The patient comes in today for follow-up of her known allergic asthma with chronic airflow obstruction. She also has known nasal polyposis and is being followed closely by otolaryngology. She has done very well on her current regimen since the last visit, with no acute exacerbation or increased rescue inhaler use. His having postnasal drip and other symptoms consistent with her known allergic rhinitis, but the nasal spray helps this considerably. She feels that her breathing is at her baseline   Review of Systems  Constitutional: Negative for fever and unexpected weight change.  HENT: Positive for postnasal drip. Negative for congestion, dental problem, ear pain, nosebleeds, rhinorrhea, sinus pressure, sneezing, sore throat and trouble swallowing.   Eyes: Negative for redness and itching.  Respiratory: Positive for cough. Negative for chest tightness, shortness of breath and wheezing.   Cardiovascular: Positive for leg swelling. Negative for palpitations.  Gastrointestinal: Negative for nausea and vomiting.  Genitourinary: Negative for dysuria.  Musculoskeletal: Negative for joint swelling.  Skin: Negative for rash.  Neurological: Negative for headaches.  Hematological: Does not bruise/bleed easily.  Psychiatric/Behavioral: Negative for dysphoric mood. The patient is not nervous/anxious.        Objective:   Physical Exam Well-developed female in no acute distress Nose without purulence or discharge noted Neck without lymphadenopathy or thyromegaly Chest with excellent airflow, no crackles or wheezes Cardiac exam with regular rate and rhythm Lower extremities with 2+ ankle edema, no cyanosis Alert and oriented, moves all 4 extremities.       Assessment & Plan:

## 2014-06-05 NOTE — Addendum Note (Signed)
Addended by: Inge Rise on: 06/05/2014 11:04 AM   Modules accepted: Orders

## 2014-06-05 NOTE — Assessment & Plan Note (Signed)
The patient is doing extremely well on her current asthma regimen, with good control of her symptoms and no recent acute exacerbation. She rarely has to use her rescue inhaler. I have asked her to continue on bronchodilators, as well as her allergy medications. Will also give her a flu shot today.

## 2014-06-06 ENCOUNTER — Telehealth: Payer: Self-pay | Admitting: Cardiology

## 2014-06-06 ENCOUNTER — Telehealth: Payer: Self-pay | Admitting: Pharmacist

## 2014-06-06 NOTE — Telephone Encounter (Signed)
New message      Pt want to start coming here to the coumadin clinic.  She gets it checked at Dr Teofilo Pod office but she does not like the people in their coumadin clinic.  Please call

## 2014-06-06 NOTE — Telephone Encounter (Signed)
Pt was referred to Lipid clinic but called to discuss cholesterol over the phone.    RF: CAD, HTN, age - LDL goal < 70  Meds: Simvastatin 5 mg qd, CoQ-10 200mg /d  Intolerant: Crestor qweek, Vytorin, pravastatin (myalgias), Zetia, Welchol.  LDL is slightly above ideal target but has trended down since last year.  It remains  < 100 mg/dL in 80 y.o. Patient with h/o multiple statin intolerances, will continue simvastatin 5 mg qd for now.  Pt is agreeable to plan.

## 2014-06-06 NOTE — Telephone Encounter (Signed)
Patient st she does not trust the girls at her Cou[facility]in Clinic and would like to come back to [place], regardless of cost. Informed patient this message will be forwarded to Coumadin Clinic and they will F/U with her.

## 2014-06-07 ENCOUNTER — Ambulatory Visit: Payer: Medicare HMO | Admitting: Pharmacist

## 2014-06-07 NOTE — Telephone Encounter (Signed)
Spoke with pt.  Appt made to establish in our clinic for Monday.

## 2014-06-11 ENCOUNTER — Ambulatory Visit (INDEPENDENT_AMBULATORY_CARE_PROVIDER_SITE_OTHER): Payer: Medicare HMO | Admitting: *Deleted

## 2014-06-11 DIAGNOSIS — I480 Paroxysmal atrial fibrillation: Secondary | ICD-10-CM

## 2014-06-11 DIAGNOSIS — Z5181 Encounter for therapeutic drug level monitoring: Secondary | ICD-10-CM

## 2014-06-11 LAB — POCT INR: INR: 2.6

## 2014-06-19 ENCOUNTER — Other Ambulatory Visit: Payer: Self-pay | Admitting: Pulmonary Disease

## 2014-07-09 ENCOUNTER — Ambulatory Visit (INDEPENDENT_AMBULATORY_CARE_PROVIDER_SITE_OTHER): Payer: Medicare HMO | Admitting: *Deleted

## 2014-07-09 DIAGNOSIS — I480 Paroxysmal atrial fibrillation: Secondary | ICD-10-CM

## 2014-07-09 DIAGNOSIS — Z5181 Encounter for therapeutic drug level monitoring: Secondary | ICD-10-CM

## 2014-07-09 LAB — POCT INR: INR: 1.8

## 2014-07-11 ENCOUNTER — Encounter: Payer: Self-pay | Admitting: Cardiology

## 2014-07-23 ENCOUNTER — Ambulatory Visit (INDEPENDENT_AMBULATORY_CARE_PROVIDER_SITE_OTHER): Payer: Medicare HMO

## 2014-07-23 DIAGNOSIS — I480 Paroxysmal atrial fibrillation: Secondary | ICD-10-CM

## 2014-07-23 DIAGNOSIS — Z5181 Encounter for therapeutic drug level monitoring: Secondary | ICD-10-CM

## 2014-07-23 LAB — POCT INR: INR: 1.9

## 2014-08-06 ENCOUNTER — Ambulatory Visit (INDEPENDENT_AMBULATORY_CARE_PROVIDER_SITE_OTHER): Payer: Medicare HMO

## 2014-08-06 DIAGNOSIS — I480 Paroxysmal atrial fibrillation: Secondary | ICD-10-CM

## 2014-08-06 DIAGNOSIS — Z5181 Encounter for therapeutic drug level monitoring: Secondary | ICD-10-CM

## 2014-08-06 DIAGNOSIS — I4891 Unspecified atrial fibrillation: Secondary | ICD-10-CM

## 2014-08-06 LAB — POCT INR: INR: 2.1

## 2014-08-06 MED ORDER — WARFARIN SODIUM 2.5 MG PO TABS: 2.5000 mg | ORAL_TABLET | Freq: Every day | ORAL | Status: DC

## 2014-08-29 ENCOUNTER — Ambulatory Visit (INDEPENDENT_AMBULATORY_CARE_PROVIDER_SITE_OTHER): Payer: Medicare HMO | Admitting: Pharmacist

## 2014-08-29 ENCOUNTER — Other Ambulatory Visit: Payer: Self-pay | Admitting: Pulmonary Disease

## 2014-08-29 DIAGNOSIS — I4891 Unspecified atrial fibrillation: Secondary | ICD-10-CM

## 2014-08-29 DIAGNOSIS — I480 Paroxysmal atrial fibrillation: Secondary | ICD-10-CM

## 2014-08-29 DIAGNOSIS — Z5181 Encounter for therapeutic drug level monitoring: Secondary | ICD-10-CM

## 2014-08-29 LAB — POCT INR: INR: 2.4

## 2014-09-03 ENCOUNTER — Encounter (HOSPITAL_COMMUNITY): Payer: Self-pay | Admitting: Emergency Medicine

## 2014-09-03 ENCOUNTER — Emergency Department (HOSPITAL_COMMUNITY)
Admission: EM | Admit: 2014-09-03 | Discharge: 2014-09-03 | Disposition: A | Discharge status: Home / Self Care | Payer: Medicare HMO | Attending: Emergency Medicine | Admitting: Emergency Medicine

## 2014-09-03 ENCOUNTER — Emergency Department (HOSPITAL_COMMUNITY): Payer: Medicare HMO

## 2014-09-03 DIAGNOSIS — Z792 Long term (current) use of antibiotics: Secondary | ICD-10-CM | POA: Insufficient documentation

## 2014-09-03 DIAGNOSIS — I10 Essential (primary) hypertension: Secondary | ICD-10-CM | POA: Insufficient documentation

## 2014-09-03 DIAGNOSIS — J45909 Unspecified asthma, uncomplicated: Secondary | ICD-10-CM | POA: Insufficient documentation

## 2014-09-03 DIAGNOSIS — Z8669 Personal history of other diseases of the nervous system and sense organs: Secondary | ICD-10-CM | POA: Diagnosis not present

## 2014-09-03 DIAGNOSIS — M6281 Muscle weakness (generalized): Secondary | ICD-10-CM | POA: Insufficient documentation

## 2014-09-03 DIAGNOSIS — L03115 Cellulitis of right lower limb: Secondary | ICD-10-CM | POA: Insufficient documentation

## 2014-09-03 DIAGNOSIS — Z7952 Long term (current) use of systemic steroids: Secondary | ICD-10-CM | POA: Diagnosis not present

## 2014-09-03 DIAGNOSIS — R531 Weakness: Secondary | ICD-10-CM

## 2014-09-03 DIAGNOSIS — Z8673 Personal history of transient ischemic attack (TIA), and cerebral infarction without residual deficits: Secondary | ICD-10-CM | POA: Diagnosis not present

## 2014-09-03 DIAGNOSIS — Z87891 Personal history of nicotine dependence: Secondary | ICD-10-CM | POA: Insufficient documentation

## 2014-09-03 DIAGNOSIS — I2510 Atherosclerotic heart disease of native coronary artery without angina pectoris: Secondary | ICD-10-CM | POA: Insufficient documentation

## 2014-09-03 DIAGNOSIS — Z7901 Long term (current) use of anticoagulants: Secondary | ICD-10-CM | POA: Diagnosis not present

## 2014-09-03 DIAGNOSIS — Z880 Allergy status to penicillin: Secondary | ICD-10-CM | POA: Diagnosis not present

## 2014-09-03 DIAGNOSIS — E785 Hyperlipidemia, unspecified: Secondary | ICD-10-CM | POA: Insufficient documentation

## 2014-09-03 DIAGNOSIS — Z79899 Other long term (current) drug therapy: Secondary | ICD-10-CM | POA: Insufficient documentation

## 2014-09-03 DIAGNOSIS — J069 Acute upper respiratory infection, unspecified: Secondary | ICD-10-CM | POA: Diagnosis not present

## 2014-09-03 DIAGNOSIS — R05 Cough: Secondary | ICD-10-CM | POA: Diagnosis present

## 2014-09-03 LAB — URINALYSIS, ROUTINE W REFLEX MICROSCOPIC
Bilirubin Urine: NEGATIVE
GLUCOSE, UA: NEGATIVE mg/dL
Ketones, ur: NEGATIVE mg/dL
Nitrite: NEGATIVE
Protein, ur: NEGATIVE mg/dL
SPECIFIC GRAVITY, URINE: 1.02 (ref 1.005–1.030)
UROBILINOGEN UA: 0.2 mg/dL (ref 0.0–1.0)
pH: 6 (ref 5.0–8.0)

## 2014-09-03 LAB — CBC WITH DIFFERENTIAL/PLATELET
Basophils Absolute: 0.1 10*3/uL (ref 0.0–0.1)
Basophils Relative: 1 % (ref 0–1)
EOS PCT: 8 % — AB (ref 0–5)
Eosinophils Absolute: 0.8 10*3/uL — ABNORMAL HIGH (ref 0.0–0.7)
HCT: 39.4 % (ref 36.0–46.0)
Hemoglobin: 13.1 g/dL (ref 12.0–15.0)
LYMPHS ABS: 2.5 10*3/uL (ref 0.7–4.0)
LYMPHS PCT: 24 % (ref 12–46)
MCH: 29.2 pg (ref 26.0–34.0)
MCHC: 33.2 g/dL (ref 30.0–36.0)
MCV: 87.9 fL (ref 78.0–100.0)
Monocytes Absolute: 0.7 10*3/uL (ref 0.1–1.0)
Monocytes Relative: 7 % (ref 3–12)
NEUTROS ABS: 6.5 10*3/uL (ref 1.7–7.7)
NEUTROS PCT: 60 % (ref 43–77)
PLATELETS: 181 10*3/uL (ref 150–400)
RBC: 4.48 MIL/uL (ref 3.87–5.11)
RDW: 15.4 % (ref 11.5–15.5)
WBC: 10.6 10*3/uL — AB (ref 4.0–10.5)

## 2014-09-03 LAB — URINE MICROSCOPIC-ADD ON

## 2014-09-03 LAB — BASIC METABOLIC PANEL
Anion gap: 9 (ref 5–15)
BUN: 10 mg/dL (ref 6–23)
CHLORIDE: 106 mmol/L (ref 96–112)
CO2: 22 mmol/L (ref 19–32)
CREATININE: 0.9 mg/dL (ref 0.50–1.10)
Calcium: 9.1 mg/dL (ref 8.4–10.5)
GFR calc Af Amer: 64 mL/min — ABNORMAL LOW (ref >90)
GFR calc non Af Amer: 55 mL/min — ABNORMAL LOW (ref >90)
GLUCOSE: 110 mg/dL — AB (ref 70–99)
POTASSIUM: 3.9 mmol/L (ref 3.5–5.1)
Sodium: 137 mmol/L (ref 135–145)

## 2014-09-03 MED ORDER — SODIUM CHLORIDE 0.9 % IV BOLUS (SEPSIS)
1000.0000 mL | Freq: Once | INTRAVENOUS | Status: AC
  Administered 2014-09-03: 1000 mL via INTRAVENOUS

## 2014-09-03 MED ORDER — ALPRAZOLAM 0.25 MG PO TABS: 0.2500 mg | ORAL_TABLET | Freq: Every evening | ORAL | Status: DC | PRN

## 2014-09-03 MED ORDER — ALPRAZOLAM 0.25 MG PO TABS
0.2500 mg | ORAL_TABLET | Freq: Once | ORAL | Status: AC
  Administered 2014-09-03: 0.25 mg via ORAL
  Filled 2014-09-03: qty 1

## 2014-09-03 MED ORDER — DOXYCYCLINE HYCLATE 100 MG PO CAPS: 100.0000 mg | ORAL_CAPSULE | Freq: Two times a day (BID) | ORAL | Status: DC

## 2014-09-03 NOTE — ED Notes (Signed)
Pt with URI sx and generalized weakness x several days getting more severe; pt seen at [facility] for same; pt sts rash to lower leg

## 2014-09-03 NOTE — Discharge Instructions (Signed)
If you were given medicines take as directed.  If you are on coumadin or contraceptives realize their levels and effectiveness is altered by many different medicines.  If you have any reaction (rash, tongues swelling, other) to the medicines stop taking and see a physician.   Please follow up as directed and return to the ER or see a physician for new or worsening symptoms.  Thank you. Filed Vitals:   09/03/14 1100 09/03/14 1145 09/03/14 1230 09/03/14 1330  BP: 151/65 155/60 144/58 151/56  Pulse: 78 89 95 92  Temp:      TempSrc:      Resp: 12 21 18 19   SpO2: 99% 98% 96% 98%

## 2014-09-08 NOTE — ED Provider Notes (Signed)
CSN: [id number]     Arrival date & time 09/03/14  1023 History   First MD Initiated Contact with Patient 09/03/14 1030     Chief Complaint  Patient presents with  . Cough  . Weakness     (Consider location/radiation/quality/duration/timing/severity/associated sxs/prior Treatment) HPI Comments: 81 yo female with hx of CAD, a fib, HTN presents with fatigue and URI symptoms for approx the past week.  Pt resp symptoms persistent with congestion and cough however general weakness along with symptoms worsening, non focal.  No ha or stroke sxs.  Pt seen at urgent care for simialr.  Pt has had rash on right lower leg.  No fevers. Productive cough at times.  Tolerating po. Past smoker.Sxs intermittent   Patient is a 81 y.o. female presenting with cough and weakness. The history is provided by the patient and a relative.  Cough Associated symptoms: no chest pain, no chills, no fever, no headaches, no rash and no shortness of breath   Weakness Pertinent negatives include no chest pain, no abdominal pain, no headaches and no shortness of breath.    Past Medical History  Diagnosis Date  . Esophageal reflux   . Glaucoma   . Diverticulosis   . PAF (paroxysmal atrial fibrillation)     intolerant to beta blockers due to asthma and intolerant to CCB due to rash  . HTN (hypertension), benign   . Asthma     copd  . CAD (coronary artery disease) 2007    s/p PCI of diagonal  . Hyperlipidemia   . Stroke 02/2011    TIA  . A-fib   . Malignant melanoma     remove 09/2012  . Atrial fibrillation    Past Surgical History  Procedure Laterality Date  . Appendectomy    . Vesicovaginal fistula closure w/ tah    . Nasal sinus surgery    . Cholecystectomy    . Coronary angioplasty with stent placement    . Abdominal hysterectomy    . Eye surgery     Family History  Problem Relation Age of Onset  . Heart disease Father   . Hemachromatosis Father   . Heart attack Father   . Colon cancer Mother   .  Peripheral vascular disease Mother   . Peripheral vascular disease Sister   . Cancer Sister   . Lymphoma Sister   . Heart attack Brother   . Heart disease Brother    History  Substance Use Topics  . Smoking status: Former Smoker -- 0.20 packs/day for 25 years    Types: Cigarettes    Quit date: 07/06/1966  . Smokeless tobacco: Not on file  . Alcohol Use: No   OB History    No data available     Review of Systems  Constitutional: Negative for fever and chills.  HENT: Negative for congestion.   Eyes: Negative for visual disturbance.  Respiratory: Positive for cough. Negative for shortness of breath.   Cardiovascular: Negative for chest pain.  Gastrointestinal: Negative for vomiting and abdominal pain.  Genitourinary: Negative for dysuria and flank pain.  Musculoskeletal: Negative for back pain, neck pain and neck stiffness.  Skin: Negative for rash.  Neurological: Positive for weakness. Negative for light-headedness and headaches.      Allergies  Alphagan; Biaxin; Brovana; Budesonide; Cefdinir; Cephalexin; Clonidine derivatives; Iohexol; Nsaids; Penicillins; Sulfamethoxazole; Sulfonamide derivatives; Tetracycline; Vancomycin; Zetia; and Cardizem  Home Medications   Prior to Admission medications   Medication Sig Start Date End Date Taking? Authorizing  Provider  acetaminophen (TYLENOL) 325 MG tablet Take 325 mg by mouth every 6 (six) hours as needed for moderate pain.     Historical Provider, MD  ADVAIR DISKUS 250-50 MCG/DOSE AEPB INHALE ONE DOSE BY MOUTH TWICE DAILY 06/19/14   Kathee Delton, MD  albuterol (PROVENTIL HFA;VENTOLIN HFA) 108 (90 BASE) MCG/ACT inhaler Inhale 1 puff into the lungs every 4 (four) hours as needed for wheezing or shortness of breath. 05/11/13   Kathee Delton, MD  ALPRAZolam Duanne Moron) 0.25 MG tablet Take 1 tablet (0.25 mg total) by mouth at bedtime as needed for anxiety or sleep. 09/03/14   Mariea Clonts, MD  amLODipine-valsartan (EXFORGE) 10-320 MG  per tablet Take 1 tablet by mouth daily. 04/27/14   Sueanne Margarita, MD  cetirizine (ZYRTEC) 10 MG tablet Take 10 mg by mouth daily as needed for allergies.    Historical Provider, MD  digoxin (LANOXIN) 0.125 MG tablet Take 1 tablet (0.125 mg total) by mouth daily. 04/27/14   Sueanne Margarita, MD  doxycycline (VIBRAMYCIN) 100 MG capsule Take 1 capsule (100 mg total) by mouth 2 (two) times daily. One po bid x 7 days 09/03/14   Mariea Clonts, MD  fluticasone [facility]) 50 MCG/ACT nasal spray Place 1 spray into both nostrils daily as needed for allergies or rhinitis. 06/05/14   Kathee Delton, MD  Fluticasone-Salmeterol (ADVAIR) 250-50 MCG/DOSE AEPB Inhale 1 puff into the lungs 2 (two) times daily. 05/11/13   Kathee Delton, MD  furosemide (LASIX) 40 MG tablet Take 1 tablet (40 mg total) by mouth daily as needed for fluid. 03/01/14   Sueanne Margarita, MD  latanoprost (XALATAN) 0.005 % ophthalmic solution Place 1 drop into both eyes at bedtime.    Historical Provider, MD  PROAIR HFA 108 (90 BASE) MCG/ACT inhaler INHALE ONE PUFF BY MOUTH EVERY 4 HOURS AS NEEDED FOR  WHEEZING  OR  SHORTNESS  OF  BREATH 08/29/14   Kathee Delton, MD  simvastatin (ZOCOR) 5 MG tablet Take 5 mg by mouth daily.    Historical Provider, MD  traMADol (ULTRAM) 50 MG tablet Take 50 mg by mouth every 6 (six) hours as needed.  03/01/14   Historical Provider, MD  warfarin (COUMADIN) 2.5 MG tablet Take 1 tablet (2.5 mg total) by mouth daily. 08/06/14   Sueanne Margarita, MD   BP 156/67 mmHg  Pulse 81  Temp(Src) 97.6 F (36.4 C) (Oral)  Resp 20  SpO2 97% Physical Exam  Constitutional: She is oriented to person, place, and time. She appears well-developed and well-nourished.  HENT:  Head: Normocephalic and atraumatic.  congested  Eyes: Right eye exhibits no discharge. Left eye exhibits no discharge.  Neck: Normal range of motion. Neck supple. No tracheal deviation present.  Cardiovascular: Normal rate and regular rhythm.   Pulmonary/Chest:  Effort normal and breath sounds normal.  Abdominal: Soft. She exhibits no distension. There is no tenderness. There is no guarding.  Musculoskeletal: She exhibits no edema.  Neurological: She is alert and oriented to person, place, and time. GCS eye subscore is 4. GCS verbal subscore is 5. GCS motor subscore is 6.  5+ strength in UE and LE with f/e at major joints. Sensation to palpation intact in UE and LE. CNs 2-12 grossly intact.  EOMFI.  PERRL.   Finger nose and coordination intact bilateral.   Visual fields intact to finger testing. Mild fatigue appearance  Skin: Skin is warm. No rash noted.  Erythema and warmth  right lower anterior leg, no crepitus or streaking, nv intact distal, noinduration  Psychiatric: She has a normal mood and affect.  Nursing note and vitals reviewed.   ED Course  Procedures (including critical care time) Labs Review Labs Reviewed  BASIC METABOLIC PANEL - Abnormal; Notable for the following:    Glucose, Bld 110 (*)    GFR calc non Af Amer 55 (*)    GFR calc Af Amer 64 (*)    All other components within normal limits  CBC WITH DIFFERENTIAL/PLATELET - Abnormal; Notable for the following:    WBC 10.6 (*)    Eosinophils Relative 8 (*)    Eosinophils Absolute 0.8 (*)    All other components within normal limits  URINALYSIS, ROUTINE W REFLEX MICROSCOPIC - Abnormal; Notable for the following:    Hgb urine dipstick TRACE (*)    Leukocytes, UA MODERATE (*)    All other components within normal limits  URINE MICROSCOPIC-ADD ON - Abnormal; Notable for the following:    Squamous Epithelial / LPF FEW (*)    All other components within normal limits    Imaging Review No results found. Dg Chest 2 View  09/03/2014   CLINICAL DATA:  Shortness of breath, cough for several days.  EXAM: CHEST  2 VIEW  COMPARISON:  March 25, 2013.  FINDINGS: The heart size and mediastinal contours are within normal limits. Both lungs are clear. No pneumothorax or pleural effusion  is noted. Hyperexpansion of the lungs is noted consistent with chronic obstructive pulmonary disease. The visualized skeletal structures are unremarkable.  IMPRESSION: No acute cardiopulmonary abnormality seen.   Electronically Signed   By: Marijo Conception, M.D.   On: 09/03/2014 13:17    EKG Interpretation None      MDM   Final diagnoses:  URI (upper respiratory infection)  Cellulitis of right leg  General weakness   Clinically right leg cellulitis and URI/ bronchitis. CXR reviewed no infiltrate. Pt ambulated without difficulty.   No indication for admission at this time, will give po abx for cellulitis and plan for recheck with pcp in 48 hrs.   Family with pt during discussion.     Results and differential diagnosis were discussed with the patient/parent/guardian. Close follow up outpatient was discussed, comfortable with the plan.   Medications  sodium chloride 0.9 % bolus 1,000 mL (0 mLs Intravenous Stopped 09/03/14 1200)  ALPRAZolam (XANAX) tablet 0.25 mg (0.25 mg Oral Given 09/03/14 1316)    Filed Vitals:   09/03/14 1145 09/03/14 1230 09/03/14 1330 09/03/14 1416  BP: 155/60 144/58 151/56 156/67  Pulse: 89 95 92 81  Temp:    97.6 F (36.4 C)  TempSrc:    Oral  Resp: 21 18 19 20   SpO2: 98% 96% 98% 97%    Final diagnoses:  URI (upper respiratory infection)  Cellulitis of right leg  General weakness      Mariea Clonts, MD 09/08/14 [phone]

## 2014-09-17 ENCOUNTER — Encounter: Payer: Self-pay | Admitting: Cardiology

## 2014-09-17 NOTE — Progress Notes (Signed)
Cardiology Office Note   Date:  09/18/2014   ID:  [name], DOB Feb 16, 1924, MRN [id number]  PCP:  Florina Ou, MD  Cardiologist:   Sueanne Margarita, MD   Chief Complaint  Patient presents with  . Coronary Artery Disease  . Hypertension  . Hyperlipidemia  . Atrial Fibrillation      History of Present Illness: [name] is a 81 y.o. female with a history of ASCAD/PAF/HTN/dyslipidemia who presents today for followup. She is doing well. She denies any chest pain, dizziness, palpitations or syncope. She has chronic SOB due to asthma which is stable. She has chronic LE edema which is slightly worse since having cellulitis.    Past Medical History  Diagnosis Date  . Esophageal reflux   . Glaucoma   . Diverticulosis   . PAF (paroxysmal atrial fibrillation)     intolerant to beta blockers due to asthma and intolerant to CCB due to rash  . HTN (hypertension), benign   . Asthma     copd  . CAD (coronary artery disease) 2007    s/p PCI of diagonal  . Hyperlipidemia   . Stroke 02/2011    TIA  . Malignant melanoma     remove 09/2012    Past Surgical History  Procedure Laterality Date  . Appendectomy    . Vesicovaginal fistula closure w/ tah    . Nasal sinus surgery    . Cholecystectomy    . Coronary angioplasty with stent placement    . Abdominal hysterectomy    . Eye surgery       Current Outpatient Prescriptions  Medication Sig Dispense Refill  . acetaminophen (TYLENOL) 325 MG tablet Take 325 mg by mouth every 6 (six) hours as needed for moderate pain.     [name] ADVAIR DISKUS 250-50 MCG/DOSE AEPB INHALE ONE DOSE BY MOUTH TWICE DAILY 60 each 3  . albuterol (PROVENTIL HFA;VENTOLIN HFA) 108 (90 BASE) MCG/ACT inhaler Inhale 1 puff into the lungs every 4 (four) hours as needed for wheezing or shortness of breath.    . ALPRAZolam (XANAX) 0.25 MG tablet Take 1 tablet (0.25 mg total) by mouth at bedtime as needed for anxiety or sleep. 5 tablet 0  .  amLODipine-valsartan (EXFORGE) 10-320 MG per tablet Take 1 tablet by mouth daily. 30 tablet 5  . cetirizine (ZYRTEC) 10 MG tablet Take 10 mg by mouth daily as needed for allergies.    [name] digoxin (LANOXIN) 0.125 MG tablet Take 1 tablet (0.125 mg total) by mouth daily. 90 tablet 1  . fluticasone (FLONASE) 50 MCG/ACT nasal spray Place 1 spray into both nostrils daily as needed for allergies or rhinitis. 16 g 6  . Fluticasone-Salmeterol (ADVAIR) 250-50 MCG/DOSE AEPB Inhale 1 puff into the lungs 2 (two) times daily.    . furosemide (LASIX) 40 MG tablet Take 1 tablet (40 mg total) by mouth daily as needed for fluid. 30 tablet 1  . latanoprost (XALATAN) 0.005 % ophthalmic solution Place 1 drop into both eyes at bedtime.    [name] PROAIR HFA 108 (90 BASE) MCG/ACT inhaler INHALE ONE PUFF BY MOUTH EVERY 4 HOURS AS NEEDED FOR  WHEEZING  OR  SHORTNESS  OF  BREATH 9 each 0  . simvastatin (ZOCOR) 5 MG tablet Take 5 mg by mouth daily.    . traMADol (ULTRAM) 50 MG tablet Take 50 mg by mouth every 6 (six) hours as needed.     . warfarin (COUMADIN) 2.5 MG tablet Take 1  tablet (2.5 mg total) by mouth daily. 135 tablet 1  . doxycycline (VIBRAMYCIN) 100 MG capsule Take 1 capsule (100 mg total) by mouth 2 (two) times daily. One po bid x 7 days (Patient not taking: Reported on 09/18/2014) 14 capsule 0   No current facility-administered medications for this visit.    Allergies:   Alphagan; Biaxin; Brovana; Budesonide; Cefdinir; Cephalexin; Clonidine derivatives; Iohexol; Nsaids; Penicillins; Sulfamethoxazole; Sulfonamide derivatives; Tetracycline; Vancomycin; Zetia; and Cardizem    Social History:  The patient  reports that she quit smoking about 48 years ago. Her smoking use included Cigarettes. She has a 5 pack-year smoking history. She does not have any smokeless tobacco history on file. She reports that she does not drink alcohol or use illicit drugs.   Family History:  The patient's family history includes Cancer in her  sister; Colon cancer in her mother; Heart attack in her brother and father; Heart disease in her brother and father; Hemachromatosis in her father; Lymphoma in her sister; Peripheral vascular disease in her mother and sister.    ROS:  Please see the history of present illness.   Otherwise, review of systems are positive for none.   All other systems are reviewed and negative.    PHYSICAL EXAM: VS:  BP 122/58 mmHg  Pulse 70  Ht 5' 6.5" (1.689 m)  Wt 137 lb 6.4 oz (62.324 kg)  BMI 21.85 kg/m2  SpO2 97% , BMI Body mass index is 21.85 kg/(m^2). GEN: Well nourished, well developed, in no acute distress HEENT: normal Neck: no JVD, carotid bruits, or masses Cardiac: RRR; no murmurs, rubs, or gallops.  1+ edema of RLE Respiratory:  clear to auscultation bilaterally, normal work of breathing GI: soft, nontender, nondistended, + BS MS: no deformity or atrophy Skin: warm and dry, no rash Neuro:  Strength and sensation are intact Psych: euthymic mood, full affect   EKG:  EKG is not ordered today.    Recent Labs: 05/25/2014: ALT 16 09/03/2014: BUN 10; Creatinine 0.90; Hemoglobin 13.1; Platelets 181; Potassium 3.9; Sodium 137    Lipid Panel    Component Value Date/Time   CHOL 166 05/25/2014 0954   TRIG 106.0 05/25/2014 0954   HDL 60.00 05/25/2014 0954   CHOLHDL 3 05/25/2014 0954   VLDL 21.2 05/25/2014 0954   LDLCALC 85 05/25/2014 0954      Wt Readings from Last 3 Encounters:  09/18/14 137 lb 6.4 oz (62.324 kg)  06/05/14 147 lb 12.8 oz (67.042 kg)  04/11/14 141 lb 6.4 oz (64.139 kg)        ASSESSMENT AND PLAN:  1. ASCAD with no angina - not on ASA due to warfarin 2. HTN - controlled - continue amlodipine/Valsartan  3. Dyslipidemia - near goal at 64 - cannot tolerate higher dose of statin - continue simvastatin 5mg  daily 4. PAF maintaining NSR - continue Warfarin and lanoxin        5.  LE edema - her right leg is more swoollen than the left.  She is on warfarin so  unlikely to have a clot but I will get LE venous dopplers  Current medicines are reviewed at length with the patient today.  The patient does not have concerns regarding medicines.  The following changes have been made:  no change  Labs/ tests ordered today include: None   Orders Placed This Encounter  Procedures  . Lower Extremity Venous Duplex Right     Disposition:   FU with me in 6 months  Lurena Nida, MD  09/18/2014 11:27 AM    [facility] [address], [place], [place]  [address] Phone: [phone]; Fax: [phone]

## 2014-09-18 ENCOUNTER — Ambulatory Visit (INDEPENDENT_AMBULATORY_CARE_PROVIDER_SITE_OTHER): Payer: Medicare HMO | Admitting: *Deleted

## 2014-09-18 ENCOUNTER — Ambulatory Visit (INDEPENDENT_AMBULATORY_CARE_PROVIDER_SITE_OTHER): Payer: Medicare HMO | Admitting: Cardiology

## 2014-09-18 VITALS — BP 122/58 | HR 70 | Ht 66.5 in | Wt 137.4 lb

## 2014-09-18 DIAGNOSIS — I480 Paroxysmal atrial fibrillation: Secondary | ICD-10-CM

## 2014-09-18 DIAGNOSIS — I10 Essential (primary) hypertension: Secondary | ICD-10-CM

## 2014-09-18 DIAGNOSIS — I2583 Coronary atherosclerosis due to lipid rich plaque: Principal | ICD-10-CM

## 2014-09-18 DIAGNOSIS — Z5181 Encounter for therapeutic drug level monitoring: Secondary | ICD-10-CM

## 2014-09-18 DIAGNOSIS — E785 Hyperlipidemia, unspecified: Secondary | ICD-10-CM

## 2014-09-18 DIAGNOSIS — I2510 Atherosclerotic heart disease of native coronary artery without angina pectoris: Secondary | ICD-10-CM

## 2014-09-18 DIAGNOSIS — M7989 Other specified soft tissue disorders: Secondary | ICD-10-CM

## 2014-09-18 LAB — POCT INR: INR: 2.5

## 2014-09-18 NOTE — Patient Instructions (Signed)
Dr. Radford Pax recommends you have a RIGHT LOWER EXTREMITY VENOUS DOPPLER.   Your physician wants you to follow-up in: 6 months with Dr. Radford Pax. You will receive a reminder letter in the mail two months in advance. If you don't receive a letter, please call our office to schedule the follow-up appointment.

## 2014-09-26 ENCOUNTER — Other Ambulatory Visit: Payer: Self-pay | Admitting: Pulmonary Disease

## 2014-09-26 DIAGNOSIS — H6120 Impacted cerumen, unspecified ear: Secondary | ICD-10-CM | POA: Insufficient documentation

## 2014-09-27 ENCOUNTER — Ambulatory Visit (HOSPITAL_COMMUNITY): Payer: Medicare HMO | Attending: Cardiology | Admitting: Cardiology

## 2014-09-27 DIAGNOSIS — M7989 Other specified soft tissue disorders: Secondary | ICD-10-CM | POA: Diagnosis not present

## 2014-09-27 DIAGNOSIS — M79604 Pain in right leg: Secondary | ICD-10-CM | POA: Diagnosis not present

## 2014-09-27 NOTE — Progress Notes (Signed)
Right lower venous duplex performed

## 2014-10-16 ENCOUNTER — Ambulatory Visit (INDEPENDENT_AMBULATORY_CARE_PROVIDER_SITE_OTHER): Payer: Medicare HMO | Admitting: Pharmacist Clinician (PhC)/ Clinical Pharmacy Specialist

## 2014-10-16 DIAGNOSIS — I480 Paroxysmal atrial fibrillation: Secondary | ICD-10-CM

## 2014-10-16 DIAGNOSIS — Z5181 Encounter for therapeutic drug level monitoring: Secondary | ICD-10-CM | POA: Diagnosis not present

## 2014-10-16 LAB — POCT INR: INR: 2.6

## 2014-10-21 ENCOUNTER — Other Ambulatory Visit: Payer: Self-pay | Admitting: Cardiology

## 2014-10-25 DIAGNOSIS — R198 Other specified symptoms and signs involving the digestive system and abdomen: Secondary | ICD-10-CM | POA: Insufficient documentation

## 2014-11-01 ENCOUNTER — Telehealth: Payer: Self-pay | Admitting: Pulmonary Disease

## 2014-11-01 MED ORDER — ALBUTEROL SULFATE HFA 108 (90 BASE) MCG/ACT IN AERS: 1.0000 | INHALATION_SPRAY | RESPIRATORY_TRACT | Status: DC | PRN

## 2014-11-01 NOTE — Telephone Encounter (Signed)
Called and spoke to pt. Pt requested a refill on albuterol hfa. Rx sent to preferred pharmacy. Pt verbalized understanding and denied any further questions or concerns at this time.

## 2014-11-13 ENCOUNTER — Ambulatory Visit (INDEPENDENT_AMBULATORY_CARE_PROVIDER_SITE_OTHER): Payer: Medicare HMO | Admitting: *Deleted

## 2014-11-13 DIAGNOSIS — I480 Paroxysmal atrial fibrillation: Secondary | ICD-10-CM | POA: Diagnosis not present

## 2014-11-13 DIAGNOSIS — Z5181 Encounter for therapeutic drug level monitoring: Secondary | ICD-10-CM

## 2014-11-13 LAB — POCT INR: INR: 2.6

## 2014-12-05 ENCOUNTER — Encounter: Payer: Self-pay | Admitting: Pulmonary Disease

## 2014-12-05 ENCOUNTER — Ambulatory Visit (INDEPENDENT_AMBULATORY_CARE_PROVIDER_SITE_OTHER): Payer: Medicare HMO | Admitting: Pulmonary Disease

## 2014-12-05 ENCOUNTER — Encounter (INDEPENDENT_AMBULATORY_CARE_PROVIDER_SITE_OTHER): Payer: Self-pay

## 2014-12-05 VITALS — BP 138/60 | HR 72 | Ht 66.5 in | Wt 138.8 lb

## 2014-12-05 DIAGNOSIS — J449 Chronic obstructive pulmonary disease, unspecified: Secondary | ICD-10-CM | POA: Diagnosis not present

## 2014-12-05 MED ORDER — PREDNISONE 10 MG PO TABS: ORAL_TABLET | ORAL | Status: DC

## 2014-12-05 NOTE — Addendum Note (Signed)
Addended by: Doroteo Glassman D on: 12/05/2014 11:53 AM   Modules accepted: Orders

## 2014-12-05 NOTE — Progress Notes (Signed)
   Subjective:    Patient ID: [name], female    DOB: [birth year], 81 y.o.   MRN: [id number]  HPI The patient comes in today for follow-up of her chronic obstructive asthma with significant allergic component. She has come off Xolair because of expense related to her co-pay and injection fee, and has not done as well. She also has known nasal polyposis, and recently has had severe nasal congestion and pressure but no purulence. She has not had an exacerbation of her asthma since the last visit, and feels that her breathing is near her usual baseline.   Review of Systems  Constitutional: Negative for fever and unexpected weight change.  HENT: Positive for congestion, rhinorrhea and sneezing. Negative for dental problem, ear pain, nosebleeds, postnasal drip, sinus pressure, sore throat and trouble swallowing.   Eyes: Positive for itching. Negative for redness.  Respiratory: Positive for shortness of breath and wheezing. Negative for cough and chest tightness.   Cardiovascular: Positive for leg swelling. Negative for palpitations.  Gastrointestinal: Negative for nausea and vomiting.  Genitourinary: Negative for dysuria.  Musculoskeletal: Negative for joint swelling.  Skin: Positive for rash.  Neurological: Negative for headaches.  Hematological: Does not bruise/bleed easily.  Psychiatric/Behavioral: Negative for dysphoric mood. The patient is not nervous/anxious.        Objective:   Physical Exam Thin female in no acute distress Nose without purulence or discharge noted, but swollen turbinates with edema Neck without lymphadenopathy or thyromegaly Chest with decreased breath sounds, but no active wheezing Cardiac exam with slight irregularity, no murmurs Lower extremities with 1+ edema, no cyanosis Alert and oriented, moves all 4 extremities.       Assessment & Plan:

## 2014-12-05 NOTE — Assessment & Plan Note (Signed)
The patient has significant chronic obstructive asthma with an allergic component, and currently her breathing is is not far from her usual baseline. However, she is having severe nasal congestion and pressure, but does not feel that she has a sinus infection. She has known nasal polyposis, and feels these are significantly swollen. She has been seen by otolaryngology with surgery recommended, but she has decided against this. At this point, I would like to try her on a course of prednisone to see if we can improve her nasal airway. I have also discussed with her possibly going back on some type of biologic injection, but she has not been able to afford this. I also considered adding Spiriva to her regimen for its anticholinergic effect, but she has always had difficulties with medication expense.

## 2014-12-05 NOTE — Patient Instructions (Signed)
Will treat you with a course of prednisone to try and improve your nasal symptoms. No change in breathing medications, but may have to consider getting back on a biologic injection in the future if you continue to have problems Avoid all aspirin with your nasal polyps and asthma followup in 78mos with Dr. Lake Bells, but call if having problems.

## 2014-12-11 ENCOUNTER — Ambulatory Visit (INDEPENDENT_AMBULATORY_CARE_PROVIDER_SITE_OTHER): Payer: Medicare HMO | Admitting: *Deleted

## 2014-12-11 DIAGNOSIS — Z5181 Encounter for therapeutic drug level monitoring: Secondary | ICD-10-CM | POA: Diagnosis not present

## 2014-12-11 DIAGNOSIS — I480 Paroxysmal atrial fibrillation: Secondary | ICD-10-CM | POA: Diagnosis not present

## 2014-12-11 LAB — POCT INR: INR: 3.2

## 2015-01-08 ENCOUNTER — Ambulatory Visit (INDEPENDENT_AMBULATORY_CARE_PROVIDER_SITE_OTHER): Payer: Medicare HMO | Admitting: Pharmacist

## 2015-01-08 DIAGNOSIS — I480 Paroxysmal atrial fibrillation: Secondary | ICD-10-CM | POA: Diagnosis not present

## 2015-01-08 DIAGNOSIS — Z5181 Encounter for therapeutic drug level monitoring: Secondary | ICD-10-CM

## 2015-01-08 LAB — POCT INR: INR: 2.8

## 2015-01-14 ENCOUNTER — Other Ambulatory Visit: Payer: Self-pay | Admitting: *Deleted

## 2015-01-14 MED ORDER — FLUTICASONE-SALMETEROL 250-50 MCG/DOSE IN AEPB: 1.0000 | INHALATION_SPRAY | Freq: Two times a day (BID) | RESPIRATORY_TRACT | Status: DC

## 2015-01-24 ENCOUNTER — Other Ambulatory Visit: Payer: Self-pay

## 2015-01-24 ENCOUNTER — Other Ambulatory Visit: Payer: Self-pay | Admitting: Cardiology

## 2015-01-24 MED ORDER — DIGOXIN 125 MCG PO TABS: 125.0000 ug | ORAL_TABLET | Freq: Every day | ORAL | Status: DC

## 2015-01-24 MED ORDER — AMLODIPINE BESYLATE-VALSARTAN 10-320 MG PO TABS: 1.0000 | ORAL_TABLET | Freq: Every day | ORAL | Status: DC

## 2015-01-25 ENCOUNTER — Other Ambulatory Visit: Payer: Self-pay | Admitting: Family Medicine

## 2015-01-25 ENCOUNTER — Ambulatory Visit
Admission: RE | Admit: 2015-01-25 | Discharge: 2015-01-25 | Disposition: A | Discharge status: Home / Self Care | Payer: Medicare HMO | Source: Ambulatory Visit | Attending: Family Medicine | Admitting: Family Medicine

## 2015-01-25 ENCOUNTER — Telehealth: Payer: Self-pay | Admitting: *Deleted

## 2015-01-25 DIAGNOSIS — R0602 Shortness of breath: Secondary | ICD-10-CM

## 2015-01-25 NOTE — Telephone Encounter (Signed)
Spoke with pt and she states she is going to start Prednisone 20 mg daily for 5 days then 10 mg daily for 4 days for asthma  so pt instructed to take her coumadin and Prednisone as ordered and come in for recheck INR on Monday July 25th as there is interaction between coumadin and prednisone and she states understanding.

## 2015-01-28 ENCOUNTER — Ambulatory Visit (INDEPENDENT_AMBULATORY_CARE_PROVIDER_SITE_OTHER): Payer: Medicare HMO | Admitting: Pharmacist

## 2015-01-28 DIAGNOSIS — Z5181 Encounter for therapeutic drug level monitoring: Secondary | ICD-10-CM | POA: Diagnosis not present

## 2015-01-28 DIAGNOSIS — I480 Paroxysmal atrial fibrillation: Secondary | ICD-10-CM | POA: Diagnosis not present

## 2015-01-28 LAB — POCT INR: INR: 2.7

## 2015-02-18 ENCOUNTER — Other Ambulatory Visit: Payer: Self-pay | Admitting: Cardiology

## 2015-02-24 ENCOUNTER — Other Ambulatory Visit: Payer: Self-pay | Admitting: Cardiology

## 2015-02-27 ENCOUNTER — Ambulatory Visit (INDEPENDENT_AMBULATORY_CARE_PROVIDER_SITE_OTHER): Payer: Medicare HMO | Admitting: *Deleted

## 2015-02-27 DIAGNOSIS — Z5181 Encounter for therapeutic drug level monitoring: Secondary | ICD-10-CM

## 2015-02-27 DIAGNOSIS — I480 Paroxysmal atrial fibrillation: Secondary | ICD-10-CM | POA: Diagnosis not present

## 2015-02-27 LAB — POCT INR: INR: 2.2

## 2015-03-05 ENCOUNTER — Emergency Department (HOSPITAL_COMMUNITY): Payer: Medicare HMO

## 2015-03-05 ENCOUNTER — Encounter (HOSPITAL_COMMUNITY): Payer: Self-pay | Admitting: Emergency Medicine

## 2015-03-05 ENCOUNTER — Observation Stay (HOSPITAL_COMMUNITY)
Admission: EM | Admit: 2015-03-05 | Discharge: 2015-03-06 | Disposition: A | Discharge status: Home / Self Care | Payer: Medicare HMO | Attending: Cardiology | Admitting: Cardiology

## 2015-03-05 DIAGNOSIS — Z85820 Personal history of malignant melanoma of skin: Secondary | ICD-10-CM | POA: Diagnosis not present

## 2015-03-05 DIAGNOSIS — R079 Chest pain, unspecified: Secondary | ICD-10-CM | POA: Diagnosis present

## 2015-03-05 DIAGNOSIS — K5790 Diverticulosis of intestine, part unspecified, without perforation or abscess without bleeding: Secondary | ICD-10-CM | POA: Diagnosis not present

## 2015-03-05 DIAGNOSIS — Z7901 Long term (current) use of anticoagulants: Secondary | ICD-10-CM | POA: Diagnosis not present

## 2015-03-05 DIAGNOSIS — J449 Chronic obstructive pulmonary disease, unspecified: Secondary | ICD-10-CM | POA: Insufficient documentation

## 2015-03-05 DIAGNOSIS — Z880 Allergy status to penicillin: Secondary | ICD-10-CM | POA: Diagnosis not present

## 2015-03-05 DIAGNOSIS — Z8673 Personal history of transient ischemic attack (TIA), and cerebral infarction without residual deficits: Secondary | ICD-10-CM | POA: Diagnosis not present

## 2015-03-05 DIAGNOSIS — R072 Precordial pain: Secondary | ICD-10-CM | POA: Diagnosis not present

## 2015-03-05 DIAGNOSIS — J441 Chronic obstructive pulmonary disease with (acute) exacerbation: Secondary | ICD-10-CM | POA: Insufficient documentation

## 2015-03-05 DIAGNOSIS — I25119 Atherosclerotic heart disease of native coronary artery with unspecified angina pectoris: Secondary | ICD-10-CM | POA: Diagnosis not present

## 2015-03-05 DIAGNOSIS — Z79899 Other long term (current) drug therapy: Secondary | ICD-10-CM | POA: Insufficient documentation

## 2015-03-05 DIAGNOSIS — Z7951 Long term (current) use of inhaled steroids: Secondary | ICD-10-CM | POA: Insufficient documentation

## 2015-03-05 DIAGNOSIS — I480 Paroxysmal atrial fibrillation: Secondary | ICD-10-CM | POA: Diagnosis not present

## 2015-03-05 DIAGNOSIS — K219 Gastro-esophageal reflux disease without esophagitis: Secondary | ICD-10-CM | POA: Diagnosis not present

## 2015-03-05 DIAGNOSIS — R0981 Nasal congestion: Secondary | ICD-10-CM | POA: Insufficient documentation

## 2015-03-05 DIAGNOSIS — E785 Hyperlipidemia, unspecified: Secondary | ICD-10-CM | POA: Insufficient documentation

## 2015-03-05 DIAGNOSIS — Z87891 Personal history of nicotine dependence: Secondary | ICD-10-CM | POA: Diagnosis not present

## 2015-03-05 DIAGNOSIS — I200 Unstable angina: Secondary | ICD-10-CM

## 2015-03-05 DIAGNOSIS — H409 Unspecified glaucoma: Secondary | ICD-10-CM | POA: Insufficient documentation

## 2015-03-05 DIAGNOSIS — R0789 Other chest pain: Secondary | ICD-10-CM | POA: Insufficient documentation

## 2015-03-05 HISTORY — DX: Chronic obstructive pulmonary disease, unspecified: J44.9

## 2015-03-05 HISTORY — DX: Transient cerebral ischemic attack, unspecified: G45.9

## 2015-03-05 LAB — CBC WITH DIFFERENTIAL/PLATELET
BASOS PCT: 0 % (ref 0–1)
Basophils Absolute: 0 10*3/uL (ref 0.0–0.1)
Eosinophils Absolute: 0 10*3/uL (ref 0.0–0.7)
Eosinophils Relative: 0 % (ref 0–5)
HEMATOCRIT: 37.6 % (ref 36.0–46.0)
HEMOGLOBIN: 12.7 g/dL (ref 12.0–15.0)
LYMPHS ABS: 1.3 10*3/uL (ref 0.7–4.0)
LYMPHS PCT: 6 % — AB (ref 12–46)
MCH: 29.3 pg (ref 26.0–34.0)
MCHC: 33.8 g/dL (ref 30.0–36.0)
MCV: 86.6 fL (ref 78.0–100.0)
MONO ABS: 1.5 10*3/uL — AB (ref 0.1–1.0)
MONOS PCT: 6 % (ref 3–12)
NEUTROS ABS: 20.3 10*3/uL — AB (ref 1.7–7.7)
NEUTROS PCT: 88 % — AB (ref 43–77)
Platelets: 219 10*3/uL (ref 150–400)
RBC: 4.34 MIL/uL (ref 3.87–5.11)
RDW: 15.6 % — AB (ref 11.5–15.5)
WBC: 23.1 10*3/uL — ABNORMAL HIGH (ref 4.0–10.5)

## 2015-03-05 LAB — URINALYSIS, ROUTINE W REFLEX MICROSCOPIC
Bilirubin Urine: NEGATIVE
GLUCOSE, UA: NEGATIVE mg/dL
Hgb urine dipstick: NEGATIVE
KETONES UR: NEGATIVE mg/dL
LEUKOCYTES UA: NEGATIVE
Nitrite: NEGATIVE
PH: 5.5 (ref 5.0–8.0)
Protein, ur: NEGATIVE mg/dL
Specific Gravity, Urine: 1.011 (ref 1.005–1.030)
Urobilinogen, UA: 0.2 mg/dL (ref 0.0–1.0)

## 2015-03-05 LAB — BASIC METABOLIC PANEL
Anion gap: 9 (ref 5–15)
BUN: 21 mg/dL — AB (ref 6–20)
CHLORIDE: 105 mmol/L (ref 101–111)
CO2: 22 mmol/L (ref 22–32)
Calcium: 9.3 mg/dL (ref 8.9–10.3)
Creatinine, Ser: 1.03 mg/dL — ABNORMAL HIGH (ref 0.44–1.00)
GFR calc Af Amer: 54 mL/min — ABNORMAL LOW (ref >60)
GFR, EST NON AFRICAN AMERICAN: 47 mL/min — AB (ref >60)
GLUCOSE: 152 mg/dL — AB (ref 65–99)
Potassium: 4.4 mmol/L (ref 3.5–5.1)
Sodium: 136 mmol/L (ref 135–145)

## 2015-03-05 LAB — PROTIME-INR
INR: 2.51 — AB (ref 0.00–1.49)
PROTHROMBIN TIME: 26.8 s — AB (ref 11.6–15.2)

## 2015-03-05 LAB — I-STAT TROPONIN, ED: TROPONIN I, POC: 0.02 ng/mL (ref 0.00–0.08)

## 2015-03-05 LAB — TROPONIN I: TROPONIN I: 0.04 ng/mL — AB (ref <0.031)

## 2015-03-05 MED ORDER — SODIUM CHLORIDE 0.9 % IJ SOLN
3.0000 mL | Freq: Two times a day (BID) | INTRAMUSCULAR | Status: DC
  Administered 2015-03-05 – 2015-03-06 (×2): 3 mL via INTRAVENOUS

## 2015-03-05 MED ORDER — SODIUM CHLORIDE 0.9 % IJ SOLN: 3.0000 mL | INTRAMUSCULAR | Status: DC | PRN

## 2015-03-05 MED ORDER — SIMVASTATIN 10 MG PO TABS
5.0000 mg | ORAL_TABLET | Freq: Every day | ORAL | Status: DC
  Administered 2015-03-05: 5 mg via ORAL
  Filled 2015-03-05: qty 1

## 2015-03-05 MED ORDER — ALBUTEROL SULFATE (2.5 MG/3ML) 0.083% IN NEBU: 2.5000 mg | INHALATION_SOLUTION | RESPIRATORY_TRACT | Status: DC | PRN

## 2015-03-05 MED ORDER — ACETAMINOPHEN 325 MG PO TABS: 325.0000 mg | ORAL_TABLET | Freq: Four times a day (QID) | ORAL | Status: DC | PRN

## 2015-03-05 MED ORDER — SODIUM CHLORIDE 0.9 % IV SOLN: 250.0000 mL | INTRAVENOUS | Status: DC | PRN

## 2015-03-05 MED ORDER — ASPIRIN 300 MG RE SUPP: 300.0000 mg | RECTAL | Status: AC

## 2015-03-05 MED ORDER — ALPRAZOLAM 0.25 MG PO TABS
0.2500 mg | ORAL_TABLET | Freq: Every evening | ORAL | Status: DC | PRN
  Administered 2015-03-05: 0.25 mg via ORAL
  Filled 2015-03-05: qty 1

## 2015-03-05 MED ORDER — LATANOPROST 0.005 % OP SOLN
1.0000 [drp] | Freq: Every day | OPHTHALMIC | Status: DC
  Administered 2015-03-05: 1 [drp] via OPHTHALMIC
  Filled 2015-03-05: qty 2.5

## 2015-03-05 MED ORDER — ALBUTEROL SULFATE HFA 108 (90 BASE) MCG/ACT IN AERS: 1.0000 | INHALATION_SPRAY | RESPIRATORY_TRACT | Status: DC | PRN

## 2015-03-05 MED ORDER — FLUTICASONE PROPIONATE 50 MCG/ACT NA SUSP: 1.0000 | Freq: Every day | NASAL | Status: DC | PRN

## 2015-03-05 MED ORDER — ASPIRIN 81 MG PO CHEW
324.0000 mg | CHEWABLE_TABLET | ORAL | Status: AC
  Administered 2015-03-05: 324 mg via ORAL
  Filled 2015-03-05: qty 4

## 2015-03-05 MED ORDER — ASPIRIN EC 81 MG PO TBEC
81.0000 mg | DELAYED_RELEASE_TABLET | Freq: Every day | ORAL | Status: DC
  Administered 2015-03-06: 81 mg via ORAL
  Filled 2015-03-05: qty 1

## 2015-03-05 MED ORDER — TRAMADOL HCL 50 MG PO TABS: 50.0000 mg | ORAL_TABLET | Freq: Four times a day (QID) | ORAL | Status: DC | PRN

## 2015-03-05 MED ORDER — DIGOXIN 125 MCG PO TABS
125.0000 ug | ORAL_TABLET | Freq: Every day | ORAL | Status: DC
  Administered 2015-03-06: 125 ug via ORAL
  Filled 2015-03-05: qty 1

## 2015-03-05 MED ORDER — AMLODIPINE BESYLATE 10 MG PO TABS
10.0000 mg | ORAL_TABLET | Freq: Every day | ORAL | Status: DC
  Administered 2015-03-06: 10 mg via ORAL
  Filled 2015-03-05: qty 1

## 2015-03-05 MED ORDER — NITROGLYCERIN 0.4 MG SL SUBL: 0.4000 mg | SUBLINGUAL_TABLET | SUBLINGUAL | Status: DC | PRN

## 2015-03-05 MED ORDER — ONDANSETRON HCL 4 MG/2ML IJ SOLN: 4.0000 mg | Freq: Four times a day (QID) | INTRAMUSCULAR | Status: DC | PRN

## 2015-03-05 MED ORDER — IRBESARTAN 150 MG PO TABS
300.0000 mg | ORAL_TABLET | Freq: Every day | ORAL | Status: DC
  Administered 2015-03-06: 300 mg via ORAL
  Filled 2015-03-05: qty 2

## 2015-03-05 MED ORDER — AMLODIPINE BESYLATE-VALSARTAN 10-320 MG PO TABS: 1.0000 | ORAL_TABLET | Freq: Every day | ORAL | Status: DC

## 2015-03-05 MED ORDER — NON FORMULARY: 1.0000 | Freq: Two times a day (BID) | Status: DC

## 2015-03-05 NOTE — ED Notes (Signed)
Report attempted. Nursed to call back.

## 2015-03-05 NOTE — ED Notes (Signed)
Per EMS - pt began having central chest pain x3 hours ago, no radiation described as dull; 1 nitro given by EMS, made pain worse and pain re-located to higher up in the center of chest. 12 lead shows atrial fibrillation, normal for pt. 140/60, 120 HR, RR 20, 98% RA. A/o x4.

## 2015-03-05 NOTE — H&P (Signed)
Patient ID: [name] MRN: [id number], DOB/AGE: 1923/09/16   Admit date: 03/05/2015   Primary Physician: [facility] currently. Primary Cardiologist: T. Radford Pax, MD   Pt. Profile:  81 y/o female with a h/o CAD s/p PCI of the Diag in 2007, who presented to the ED today 2/2 chest pain.  Problem List  Past Medical History  Diagnosis Date  . Esophageal reflux   . Glaucoma   . Diverticulosis   . PAF (paroxysmal atrial fibrillation)     a. intolerant to beta blockers due to asthma and intolerant to CCB due to rash;  b. CHA2DS2VASc = 6-->chronic coumadin;  c. 03/2014 Echo: EF 60-65%, Gr 1 DD, triv AI, mildly dil RV, PASP 63mmhg.  . HTN (hypertension), benign   . COPD/Asthma   . CAD (coronary artery disease) 2007    a. 1997 s/p PCI of diagonal.  . Hyperlipidemia   . TIA (transient ischemic attack)     a. 02/2011 - chronic coumadin in setting of PAF.  . Malignant melanoma     a. 09/2012 s/p resection.    Past Surgical History  Procedure Laterality Date  . Appendectomy    . Vesicovaginal fistula closure w/ tah    . Nasal sinus surgery    . Cholecystectomy    . Coronary angioplasty with stent placement    . Abdominal hysterectomy    . Eye surgery       Allergies  Allergies  Allergen Reactions  . Alphagan [Brimonidine] Other (See Comments)    Burning sensation  . Biaxin [Clarithromycin]   . Brovana [Arformoterol] Other (See Comments)    Makes my heart race  . Budesonide Other (See Comments)    Makes my heart race  . Cefdinir     "FEEL HORRIBLE"  . Cephalexin Itching  . Clonidine Derivatives Other (See Comments)    REACTION: unknown  . Iohexol Other (See Comments)     Desc: NOTES FROM PRIOR CT STATES PRE MEDICATION PRIOR TO OMNIPAQUE ENHANCED CT   . Nsaids   . Penicillins Itching and Swelling    Swelling to ears  . Sulfamethoxazole   . Sulfonamide Derivatives Itching  . Tetracycline Itching  . Vancomycin Other (See Comments)    Turn red  . Zetia  [Ezetimibe]   . Cardizem [Diltiazem Hcl] Rash    HPI  81 y/o female with a h/o CAD s/p PCI of the Diag, PAF on chronic coumadin, HTN, TIA, asthma, and HL.  She was last seen in cardiology clinic in March, @ which time she was doing well w/o chest pain. She lives locally with her dog and is very independent by herself at home. She reports intermittent dyspnea on exertion, which she attributes to sinus congestion from time to time. Activity is also somewhat limited by left knee pain, for which she receives steroids injections. Yesterday, she received a steroids injection secondary to sinus congestion. She says that as is usual following intramuscular steroids injections, she has been having some sinus tachycardia since then.  This morning, she got up late at about 9:30. About an hour later, after having eaten breakfast, she developed 4-5/10 substernal dull aching without associated symptoms. She did notice that when she got up to walk to the bathroom, she was more short of breath than usual but the dull aching did not worsen any. After about 30 minutes of ongoing aching discomfort that was not worse with deep breathing, palpation, position changes, or swallowing, she called her daughter and  then subsequently EMS. En route to the emergency department, she was treated with sublingual nitroglycerin without significant change in 4-5 out of 10 dull aching discomfort.  Here, she has been tachycardic (sinus) and hemodynamically stable. ECG shows mild inferolateral ST depression, however this is similar to prior ECGs. Chest pain resolved completely at approximately 1:30 PM, 3 hours after onset. She is currently chest pain-free.  Home Medications  Prior to Admission medications   Medication Sig Start Date End Date Taking? Authorizing Provider  acetaminophen (TYLENOL) 325 MG tablet Take 325 mg by mouth every 6 (six) hours as needed for moderate pain.     Historical Provider, MD  albuterol (PROVENTIL HFA;VENTOLIN  HFA) 108 (90 BASE) MCG/ACT inhaler Inhale 1-2 puffs into the lungs every 4 (four) hours as needed for wheezing or shortness of breath. 11/01/14   Kathee Delton, MD  ALPRAZolam Duanne Moron) 0.25 MG tablet Take 1 tablet (0.25 mg total) by mouth at bedtime as needed for anxiety or sleep. Patient not taking: Reported on 12/05/2014 09/03/14   Elnora Morrison, MD  amLODipine-valsartan (EXFORGE) 10-320 MG per tablet Take 1 tablet by mouth daily. 01/24/15   Sueanne Margarita, MD  cetirizine (ZYRTEC) 10 MG tablet Take 10 mg by mouth daily as needed for allergies.    Historical Provider, MD  digoxin (LANOXIN) 0.125 MG tablet Take 1 tablet (125 mcg total) by mouth daily. 01/24/15   Sueanne Margarita, MD  fluticasone (FLONASE) 50 MCG/ACT nasal spray Place 1 spray into both nostrils daily as needed for allergies or rhinitis. 06/05/14   Kathee Delton, MD  Fluticasone-Salmeterol (ADVAIR DISKUS) 250-50 MCG/DOSE AEPB Inhale 1 puff into the lungs 2 (two) times daily. 01/14/15   Juanito Doom, MD  furosemide (LASIX) 40 MG tablet Take 1 tablet (40 mg total) by mouth daily as needed for fluid. 03/01/14   Sueanne Margarita, MD  latanoprost (XALATAN) 0.005 % ophthalmic solution Place 1 drop into both eyes at bedtime.    Historical Provider, MD  predniSONE (DELTASONE) 10 MG tablet Take 4 tabs x 3 days, 3 tabs x 3 days, 2 tabs x 3 days, 1 tab x 3 days then stop. 12/05/14   Kathee Delton, MD  simvastatin (ZOCOR) 5 MG tablet TAKE ONE TABLET BY MOUTH AT BEDTIME 02/19/15   Sueanne Margarita, MD  traMADol (ULTRAM) 50 MG tablet Take 50 mg by mouth every 6 (six) hours as needed.  03/01/14   Historical Provider, MD  warfarin (COUMADIN) 2.5 MG tablet TAKE ONE TABLET BY MOUTH ONCE DAILY 02/25/15   Sueanne Margarita, MD    Family History  Family History  Problem Relation Age of Onset  . Heart disease Father   . Hemachromatosis Father   . Heart attack Father   . Colon cancer Mother   . Peripheral vascular disease Mother   . Peripheral vascular disease  Sister   . Cancer Sister   . Lymphoma Sister   . Heart attack Brother   . Heart disease Brother    Social History  Social History   Social History  . Marital Status: Divorced    Spouse Name: N/A  . Number of Children: N/A  . Years of Education: N/A   Occupational History  . Retired Scientist, research (physical sciences).    Social History Main Topics  . Smoking status: Former Smoker -- 0.20 packs/day for 25 years    Types: Cigarettes    Quit date: 07/06/1966  . Smokeless tobacco:  Not on file  . Alcohol Use: No  . Drug Use: No  . Sexual Activity: Not on file   Other Topics Concern  . Not on file   Social History Narrative   Lives in [place] with her dog.  She is very independent.    Review of Systems General:  No chills, fever, night sweats or weight changes.  Cardiovascular:  Positive chest pain, positive dyspnea on exertion, occasional lower extremity/ankle edema, no orthopnea, palpitations, paroxysmal nocturnal dyspnea. Dermatological: No rash, lesions/masses Respiratory: No cough, positive dyspnea this morning with ambulation to the bathroom. Urologic: No hematuria, dysuria Abdominal:   No nausea, vomiting, diarrhea, bright red blood per rectum, melena, or hematemesis Neurologic:  No visual changes, wkns, changes in mental status. All other systems reviewed and are otherwise negative except as noted above.  Physical Exam  Blood pressure 164/63, pulse 110, temperature 98.4 F (36.9 C), temperature source Oral, resp. rate 31, SpO2 96 %.  General: Pleasant, NAD Psych: Normal affect. Neuro: Alert and oriented X 3. Moves all extremities spontaneously. HEENT: Normal  Neck: Supple without bruits or JVD. Lungs:  Resp regular and unlabored, CTA. Heart: RRR, tachycardic, no s3, s4, or murmurs. Abdomen: Soft, non-tender, non-distended, BS + x 4.  Extremities: No clubbing, cyanosis or edema. DP/PT/Radials 2+ and equal bilaterally.  Labs  Troponin [facility] Test)  Recent Labs   03/05/15 1400  TROPIPOC 0.02   Lab Results  Component Value Date   WBC 23.1* 03/05/2015   HGB 12.7 03/05/2015   HCT 37.6 03/05/2015   MCV 86.6 03/05/2015   PLT 219 03/05/2015     Recent Labs Lab 03/05/15 1354  NA 136  K 4.4  CL 105  CO2 22  BUN 21*  CREATININE 1.03*  CALCIUM 9.3  GLUCOSE 152*   Lab Results  Component Value Date   INR 2.51* 03/05/2015   INR 2.2 02/27/2015   INR 2.7 01/28/2015    Radiology/Studies  Dg Chest Portable 1 View  03/05/2015   CLINICAL DATA:  Chest pain today. History of asthma and atrial fibrillation. Initial encounter.  EXAM: PORTABLE CHEST - 1 VIEW  COMPARISON:  01/25/2015 and 09/03/2014.  FINDINGS: 1348 hours. Heart size is at the upper limits of normal and stable for portable AP technique. Aortic atherosclerosis noted. The lungs are clear. There is no pleural effusion or pneumothorax. The bones appear unchanged. Telemetry leads overlie the chest.  IMPRESSION: Stable chest.  No acute cardiopulmonary process.   Electronically Signed   By: Richardean Sale M.D.   On: 03/05/2015 14:13   ECG  RSR, 106, inflat ST depression - slightly more pronounced than on prior ecg.  ASSESSMENT AND PLAN  1.  Unstable Angina: Patient presented to the emergency department today following sudden onset of substernal dull aching discomfort in her chest that became associated with mild dyspnea on exertion. These symptoms are new for her. Symptoms finally resolved after about 3-1/2 hours. She is currently chest pain-free. ECG is notable for sinus tachycardia, however she says that this is a common response following steroids injections, which she did receive yesterday. She has mild ST depression inferolaterally, which is slightly more pronounced than what was seen on old ECG.  Initial troponin is normal. We will plan to observe and cycle cardiac markers. If troponin remains negative and she does not have any recurrence of chest discomfort, we will likely pursue inpatient  stress testing to rule out ischemia. If however, she rules in for myocardial infarction, we  will plan to pursue diagnostic catheterization. Continue statin therapy. She is not typically on aspirin in the setting of chronic Coumadin anticoagulation however, I will add it now. Hold Coumadin in case she requires catheterization. INR is 2.5 today. If she were to require catheterization, she would require a heparin bridge secondary to history of TIA. No beta blocker in the setting of prior intolerance related to asthma.  2. Paroxysmal atrial fibrillation: Patient is in sinus rhythm, though she is tachycardic. She is on digoxin and we will continue this. She is not on a beta blocker secondary to history of asthma, and has previously developed a rash with diltiazem. As above, I will hold Coumadin in case she requires catheterization. She would require heparinization once INR less than 2.0, given history of TIA.  3. Essential hypertension: Blood pressure currently elevated. Continue home dose of amlodipine-valsartan and follow.  4. Hyperlipidemia: LDL in November 2015 was 85. She had normal LFTs last year. She is on low-dose simvastatin in the setting of prior intolerance of higher doses of statins.  5. Asthma/COPD: She is not actively wheezing. She received an intramuscular steroid injection yesterday for sinus congestion. Continue home inhalers.  Signed, Murray Hodgkins, NP 03/05/2015, 3:55 PM  History and all data above reviewed.  Patient examined.  I agree with the findings as above.  She had chest pain this morning.  This has not been a recurrent problem.  There were some typical but some atypical features.  As of yet.  No objective evidence of ischemia.  The patient exam reveals COR:RRR  ,  Lungs: Clear  ,  Abd: Positive bowel sounds, no rebound no guarding, Ext No edema  .  All available labs, radiology testing, previous records reviewed. Agree with documented assessment and plan. Chest pain:  Plan to  observe and rule out MI.  If enzymes negative and no further chest pain then we will plan a Lexiscan Myoview.  Check an echo to evaluate the murmur.  There is radiation to the axilla although it is not holosystolic.  Possibly related to aortic sclerosis noted in the past.    Minus Breeding  4:32 PM  03/05/2015

## 2015-03-05 NOTE — ED Provider Notes (Signed)
CSN: [id number]     Arrival date & time 03/05/15  1256 History   First MD Initiated Contact with Patient 03/05/15 1258     Chief Complaint  Patient presents with  . Chest Pain   81 y/o woman with complicated medical history including CAD s/p PCI, paroxysmal afib, previous stroke, on therapeutic dose of warfarin anticoagulation. presents for acute onset of chest pain approximately 10:30am this morning. Of note she was seen by PCP yesterday for allergy/athsma exacerbation and was given a steroid injection. She was unable to fall asleep last night until 7am and slept only 2 hours. She ate breakfast without difficulty but an hour later developed retrosternal chest pain and transient shortness of breath. Since that time her SOB resolved but she has continued experiencing intermittent chest pain and tightness at 15 minute intervals. This is accompanied with heart palpitations. She has no nausea, diaphoresis, or light headedness.  (Consider location/radiation/quality/duration/timing/severity/associated sxs/prior Treatment) Patient is a 81 y.o. female presenting with chest pain. The history is provided by the patient.  Chest Pain Pain location:  Substernal area Pain quality: aching and tightness   Pain quality: not radiating   Pain radiates to:  Does not radiate Pain radiates to the back: no   Pain severity:  Moderate Onset quality:  Sudden Duration:  3 hours Timing:  Intermittent (15 minutes) Progression:  Waxing and waning Chronicity:  New Context: at rest   Relieved by:  Nitroglycerin and rest Associated symptoms: palpitations and shortness of breath   Associated symptoms: no abdominal pain, no diaphoresis, no dizziness, no fever, no nausea, not vomiting and no weakness     Past Medical History  Diagnosis Date  . Esophageal reflux   . Glaucoma   . Diverticulosis   . PAF (paroxysmal atrial fibrillation)     a. intolerant to beta blockers due to asthma and intolerant to CCB due to rash;   b. CHA2DS2VASc = 6-->chronic coumadin;  c. 03/2014 Echo: EF 60-65%, Gr 1 DD, triv AI, mildly dil RV, PASP 70mmhg.  . HTN (hypertension), benign   . COPD/Asthma   . CAD (coronary artery disease) 2007    a. 1997 s/p PCI of diagonal.  . Hyperlipidemia   . TIA (transient ischemic attack)     a. 02/2011 - chronic coumadin in setting of PAF.  . Malignant melanoma     a. 09/2012 s/p resection.   Past Surgical History  Procedure Laterality Date  . Appendectomy    . Vesicovaginal fistula closure w/ tah    . Nasal sinus surgery    . Cholecystectomy    . Coronary angioplasty with stent placement    . Abdominal hysterectomy    . Eye surgery     Family History  Problem Relation Age of Onset  . Heart disease Father   . Hemachromatosis Father   . Heart attack Father   . Colon cancer Mother   . Peripheral vascular disease Mother   . Peripheral vascular disease Sister   . Cancer Sister   . Lymphoma Sister   . Heart attack Brother   . Heart disease Brother    Social History  Substance Use Topics  . Smoking status: Former Smoker -- 0.20 packs/day for 25 years    Types: Cigarettes    Quit date: 07/06/1966  . Smokeless tobacco: None  . Alcohol Use: No   OB History    No data available     Review of Systems  Constitutional: Negative for fever, chills and diaphoresis.  HENT: Positive for congestion and rhinorrhea.   Eyes: Negative for visual disturbance.  Respiratory: Positive for chest tightness and shortness of breath.   Cardiovascular: Positive for chest pain and palpitations.  Gastrointestinal: Negative for nausea, vomiting and abdominal pain.  Skin: Negative for pallor.  Allergic/Immunologic: Positive for environmental allergies.  Neurological: Negative for dizziness, weakness and light-headedness.    Allergies  Alphagan; Biaxin; Brovana; Budesonide; Cefdinir; Cephalexin; Clonidine derivatives; Iohexol; Nsaids; Penicillins; Sulfamethoxazole; Sulfonamide derivatives;  Tetracycline; Vancomycin; Zetia; and Cardizem  Home Medications   Prior to Admission medications   Medication Sig Start Date End Date Taking? Authorizing Provider  acetaminophen (TYLENOL) 325 MG tablet Take 325 mg by mouth every 6 (six) hours as needed for moderate pain.    Yes Historical Provider, MD  albuterol (PROVENTIL HFA;VENTOLIN HFA) 108 (90 BASE) MCG/ACT inhaler Inhale 1-2 puffs into the lungs every 4 (four) hours as needed for wheezing or shortness of breath. 11/01/14  Yes Kathee Delton, MD  amLODipine-valsartan (EXFORGE) 10-320 MG per tablet Take 1 tablet by mouth daily. 01/24/15  Yes Sueanne Margarita, MD  cetirizine (ZYRTEC) 10 MG tablet Take 10 mg by mouth daily as needed for allergies.   Yes Historical Provider, MD  digoxin (LANOXIN) 0.125 MG tablet Take 1 tablet (125 mcg total) by mouth daily. 01/24/15  Yes Sueanne Margarita, MD  fluticasone (FLONASE) 50 MCG/ACT nasal spray Place 1 spray into both nostrils daily as needed for allergies or rhinitis. 06/05/14  Yes Kathee Delton, MD  Fluticasone-Salmeterol (ADVAIR DISKUS) 250-50 MCG/DOSE AEPB Inhale 1 puff into the lungs 2 (two) times daily. 01/14/15  Yes Juanito Doom, MD  furosemide (LASIX) 40 MG tablet Take 1 tablet (40 mg total) by mouth daily as needed for fluid. 03/01/14  Yes Sueanne Margarita, MD  latanoprost (XALATAN) 0.005 % ophthalmic solution Place 1 drop into both eyes at bedtime.   Yes Historical Provider, MD  simvastatin (ZOCOR) 5 MG tablet Take 5 mg by mouth daily as needed. Patient states she takes whenever she needs it for cholesterol. Per patient   Yes Historical Provider, MD  warfarin (COUMADIN) 1 MG tablet Take 1-1.5 mg by mouth daily. Monday and Friday take 1mg  rest of days take 1.5mg  tab per patient   Yes Historical Provider, MD  traMADol (ULTRAM) 50 MG tablet Take 50 mg by mouth every 6 (six) hours as needed for moderate pain.  03/01/14   Historical Provider, MD   BP 142/61 mmHg  Pulse 100  Temp(Src) 98.4 F (36.9 C)  (Oral)  Resp 26  SpO2 96% Physical Exam  Constitutional: She is oriented to person, place, and time. She appears well-developed and well-nourished. No distress.  Neck: Normal range of motion. Neck supple.  Cardiovascular:  Tachycardic, 3+ radial pulses b/l, pedal pulses difficult to assess 2/2 swelling but extremities appear intact  Pulmonary/Chest: Effort normal and breath sounds normal.  Abdominal: Soft. Bowel sounds are normal.  Musculoskeletal: She exhibits edema.  Lymphadenopathy:    She has no cervical adenopathy.  Neurological: She is alert and oriented to person, place, and time.  Skin: Skin is warm and dry. She is not diaphoretic.    ED Course  Procedures (including critical care time) Labs Review Labs Reviewed  CBC WITH DIFFERENTIAL/PLATELET - Abnormal; Notable for the following:    WBC 23.1 (*)    RDW 15.6 (*)    Neutrophils Relative % 88 (*)    Neutro Abs 20.3 (*)    Lymphocytes Relative 6 (*)  Monocytes Absolute 1.5 (*)    All other components within normal limits  BASIC METABOLIC PANEL - Abnormal; Notable for the following:    Glucose, Bld 152 (*)    BUN 21 (*)    Creatinine, Ser 1.03 (*)    GFR calc non Af Amer 47 (*)    GFR calc Af Amer 54 (*)    All other components within normal limits  PROTIME-INR - Abnormal; Notable for the following:    Prothrombin Time 26.8 (*)    INR 2.51 (*)    All other components within normal limits  URINALYSIS, ROUTINE W REFLEX MICROSCOPIC (NOT AT [facility])  Randolm Idol, ED    Imaging Review Dg Chest Portable 1 View  03/05/2015   CLINICAL DATA:  Chest pain today. History of asthma and atrial fibrillation. Initial encounter.  EXAM: PORTABLE CHEST - 1 VIEW  COMPARISON:  01/25/2015 and 09/03/2014.  FINDINGS: 1348 hours. Heart size is at the upper limits of normal and stable for portable AP technique. Aortic atherosclerosis noted. The lungs are clear. There is no pleural effusion or pneumothorax. The bones appear unchanged.  Telemetry leads overlie the chest.  IMPRESSION: Stable chest.  No acute cardiopulmonary process.   Electronically Signed   By: Richardean Sale M.D.   On: 03/05/2015 14:13   I have personally reviewed and evaluated these images and lab results as part of my medical decision-making.   EKG Interpretation   Date/Time:  Tuesday March 05 2015 13:19:30 EDT Ventricular Rate:  106 PR Interval:  214 QRS Duration: 90 QT Interval:  324 QTC Calculation: 430 R Axis:   42 Text Interpretation:  Sinus tachycardia with 1st degree A-V block  Nonspecific ST and T wave abnormality Abnormal ECG Confirmed by ZACKOWSKI   MD, SCOTT ([id number]) on 03/05/2015 2:06:04 PM      MDM   Final diagnoses:  Unstable angina   81 y/o woman with history athsma, CAD, paroxysmal Afib presents with acute onset of moderate suspicion for cardiac etiology. EKG on presentation shows sinus tach at regular rate with 1st degree block consistent with previous EKG and nonspecific ST changes. Pain is not readily reproduced on exam or on deep inspiration. Initial troponins are negative. Notable leukocytosis with neutrophil predominance likely related to her recent IM steroids. May be slightly dehydrated with mild creatinine elevation and tachycardia. Will observe and obtain repeat troponin at 3 hours. Cardiology consulted to exam patient due to her extensive history and risk factors and moderately suspicious history.  Continues to experience no chest pain during ED stay, with stable tachycardia on cardiac monitoring. After examination, patient is to be admitted for unstable angina evaluation as inpatient with cardiology service.  Collier Salina, MD 03/05/15 [phone]

## 2015-03-05 NOTE — ED Provider Notes (Addendum)
I saw and evaluated the patient, reviewed the resident's note and I agree with the findings and plan.   EKG Interpretation   Date/Time:  Tuesday March 05 2015 13:19:30 EDT Ventricular Rate:  106 PR Interval:  214 QRS Duration: 90 QT Interval:  324 QTC Calculation: 430 R Axis:   42 Text Interpretation:  Sinus tachycardia with 1st degree A-V block  Nonspecific ST and T wave abnormality Abnormal ECG Confirmed by Helaina Stefano   MD, Lesta Limbert ([id number]) on 03/05/2015 2:06:04 PM      Patient seen by me. Patient being seen by resident. EKG shows a mild sinus tachycardia patient has a history of atrial fib but does not show that. Chest x-ray negative first troponin negative. Patient with onset of the substernal chest pain at 11:00 this morning on the phone with her daughter. The pain was like the 8 out of 10 intermittent. No pain currently. Patient status post stent in the past also on Coumadin for the atrial fib. Patient is not supposed to be taking aspirin. Patient took Tylenol at home. Pain was nonradiating not associated with shortness of breath or nausea and vomiting. Unusual for Pain like this.  Patient followed by Dr. Radford Pax from cardiology will discuss with them for possible admission for rule out. Labs show evidence of low bit of a prerenal picture where she may be a little dehydrated. In addition has a marked leukocytosis but she was given a shot of steroids, for her allergies,  then today while on the phone as stated above developed the the chest pain. Will have cardiology see the patient.  Fredia Sorrow, MD 03/05/15 1507  Results for orders placed or performed during the hospital encounter of 03/05/15  CBC with Differential  Result Value Ref Range   WBC 23.1 (H) 4.0 - 10.5 K/uL   RBC 4.34 3.87 - 5.11 MIL/uL   Hemoglobin 12.7 12.0 - 15.0 g/dL   HCT 37.6 36.0 - 46.0 %   MCV 86.6 78.0 - 100.0 fL   MCH 29.3 26.0 - 34.0 pg   MCHC 33.8 30.0 - 36.0 g/dL   RDW 15.6 (H) 11.5 - 15.5 %   Platelets 219 150 - 400 K/uL   Neutrophils Relative % 88 (H) 43 - 77 %   Neutro Abs 20.3 (H) 1.7 - 7.7 K/uL   Lymphocytes Relative 6 (L) 12 - 46 %   Lymphs Abs 1.3 0.7 - 4.0 K/uL   Monocytes Relative 6 3 - 12 %   Monocytes Absolute 1.5 (H) 0.1 - 1.0 K/uL   Eosinophils Relative 0 0 - 5 %   Eosinophils Absolute 0.0 0.0 - 0.7 K/uL   Basophils Relative 0 0 - 1 %   Basophils Absolute 0.0 0.0 - 0.1 K/uL  Basic metabolic panel  Result Value Ref Range   Sodium 136 135 - 145 mmol/L   Potassium 4.4 3.5 - 5.1 mmol/L   Chloride 105 101 - 111 mmol/L   CO2 22 22 - 32 mmol/L   Glucose, Bld 152 (H) 65 - 99 mg/dL   BUN 21 (H) 6 - 20 mg/dL   Creatinine, Ser 1.03 (H) 0.44 - 1.00 mg/dL   Calcium 9.3 8.9 - 10.3 mg/dL   GFR calc non Af Amer 47 (L) >60 mL/min   GFR calc Af Amer 54 (L) >60 mL/min   Anion gap 9 5 - 15  Protime-INR  Result Value Ref Range   Prothrombin Time 26.8 (H) 11.6 - 15.2 seconds   INR 2.51 (H) 0.00 -  1.49  I-stat troponin, ED  Result Value Ref Range   Troponin i, poc 0.02 0.00 - 0.08 ng/mL   Comment 3           Dg Chest Portable 1 View  03/05/2015   CLINICAL DATA:  Chest pain today. History of asthma and atrial fibrillation. Initial encounter.  EXAM: PORTABLE CHEST - 1 VIEW  COMPARISON:  01/25/2015 and 09/03/2014.  FINDINGS: 1348 hours. Heart size is at the upper limits of normal and stable for portable AP technique. Aortic atherosclerosis noted. The lungs are clear. There is no pleural effusion or pneumothorax. The bones appear unchanged. Telemetry leads overlie the chest.  IMPRESSION: Stable chest.  No acute cardiopulmonary process.   Electronically Signed   By: Richardean Sale M.D.   On: 03/05/2015 14:13      Fredia Sorrow, MD 03/05/15 [phone]

## 2015-03-06 ENCOUNTER — Observation Stay (HOSPITAL_COMMUNITY): Payer: Medicare HMO

## 2015-03-06 DIAGNOSIS — I25119 Atherosclerotic heart disease of native coronary artery with unspecified angina pectoris: Secondary | ICD-10-CM | POA: Diagnosis not present

## 2015-03-06 DIAGNOSIS — R0981 Nasal congestion: Secondary | ICD-10-CM | POA: Diagnosis not present

## 2015-03-06 DIAGNOSIS — R0789 Other chest pain: Secondary | ICD-10-CM | POA: Diagnosis not present

## 2015-03-06 DIAGNOSIS — I200 Unstable angina: Secondary | ICD-10-CM | POA: Diagnosis not present

## 2015-03-06 DIAGNOSIS — R079 Chest pain, unspecified: Secondary | ICD-10-CM

## 2015-03-06 DIAGNOSIS — K219 Gastro-esophageal reflux disease without esophagitis: Secondary | ICD-10-CM | POA: Diagnosis not present

## 2015-03-06 DIAGNOSIS — R072 Precordial pain: Secondary | ICD-10-CM | POA: Diagnosis not present

## 2015-03-06 LAB — TROPONIN I
TROPONIN I: 0.03 ng/mL (ref <0.031)
Troponin I: 0.04 ng/mL — ABNORMAL HIGH (ref <0.031)

## 2015-03-06 LAB — NM MYOCAR MULTI W/SPECT W/WALL MOTION / EF
CHL CUP NUCLEAR SSS: 4
CHL CUP RESTING HR STRESS: 76 {beats}/min
CSEPED: 0 min
CSEPPHR: 101 {beats}/min
Estimated workload: 1 METS
Exercise duration (sec): 0 s
LHR: 0.27
LVDIAVOL: 57 mL
LVSYSVOL: 7 mL
MPHR: 131 {beats}/min
Percent HR: 77 %
SDS: 2
SRS: 2
TID: 1.2

## 2015-03-06 LAB — PROTIME-INR
INR: 2.59 — AB (ref 0.00–1.49)
Prothrombin Time: 27.4 seconds — ABNORMAL HIGH (ref 11.6–15.2)

## 2015-03-06 MED ORDER — TECHNETIUM TC 99M SESTAMIBI GENERIC - CARDIOLITE
30.0000 | Freq: Once | INTRAVENOUS | Status: AC | PRN
  Administered 2015-03-06: 30 via INTRAVENOUS

## 2015-03-06 MED ORDER — TECHNETIUM TC 99M SESTAMIBI GENERIC - CARDIOLITE
10.0000 | Freq: Once | INTRAVENOUS | Status: AC | PRN
  Administered 2015-03-06: 10 via INTRAVENOUS

## 2015-03-06 MED ORDER — REGADENOSON 0.4 MG/5ML IV SOLN
0.4000 mg | Freq: Once | INTRAVENOUS | Status: AC
Start: 2015-03-06 — End: 2015-03-06

## 2015-03-06 MED ORDER — REGADENOSON 0.4 MG/5ML IV SOLN
INTRAVENOUS | Status: AC
  Administered 2015-03-06: 0.4 mg via INTRAVENOUS
  Filled 2015-03-06: qty 5

## 2015-03-06 MED ORDER — REGADENOSON 0.4 MG/5ML IV SOLN
0.4000 mg | Freq: Once | INTRAVENOUS | Status: AC
  Administered 2015-03-06: 0.4 mg via INTRAVENOUS
  Filled 2015-03-06: qty 5

## 2015-03-06 NOTE — Progress Notes (Signed)
  Echocardiogram 2D Echocardiogram has been performed.  [name] 03/06/2015, 5:22 PM

## 2015-03-06 NOTE — Progress Notes (Signed)
Patient Name: [name] Date of Encounter: 03/06/2015  SUBJECTIVE  Feels well. No chest pain, sob or palpitation.   CURRENT MEDS . amLODipine  10 mg Oral Daily   And  . irbesartan  300 mg Oral Daily  . aspirin EC  81 mg Oral Daily  . digoxin  125 mcg Oral Daily  . latanoprost  1 drop Both Eyes QHS  . NON FORMULARY 1 puff  1 puff Inhalation BID  . simvastatin  5 mg Oral QHS  . sodium chloride  3 mL Intravenous Q12H    OBJECTIVE  Filed Vitals:   03/05/15 1916 03/05/15 2100 03/05/15 2140 03/06/15 0616  BP: 142/57 130/53  132/54  Pulse: 96 84  68  Temp: 97.5 F (36.4 C) 98 F (36.7 C)  97.6 F (36.4 C)  TempSrc: Oral Oral  Oral  Resp:    20  Height:   5\' 6"  (1.676 m)   Weight:    137 lb 14.4 oz (62.551 kg)  SpO2: 100% 98%  98%    Intake/Output Summary (Last 24 hours) at 03/06/15 0748 Last data filed at 03/05/15 2134  Gross per 24 hour  Intake    120 ml  Output      0 ml  Net    120 ml   Filed Weights   03/06/15 0616  Weight: 137 lb 14.4 oz (62.551 kg)    PHYSICAL EXAM  General: Pleasant, NAD. Neuro: Alert and oriented X 3. Moves all extremities spontaneously. Psych: Normal affect. HEENT:  Normal  Neck: Supple without bruits or JVD. Lungs:  Resp regular and unlabored, CTA. Heart: RRR no s3 s4. + murmurs. Abdomen: Soft, non-tender, non-distended, BS + x 4.  Extremities: No clubbing, cyanosis or edema. DP/PT/Radials 2+ and equal bilaterally.  Accessory Clinical Findings  CBC  Recent Labs  03/05/15 1354  WBC 23.1*  NEUTROABS 20.3*  HGB 12.7  HCT 37.6  MCV 86.6  PLT 563   Basic Metabolic Panel  Recent Labs  03/05/15 1354  NA 136  K 4.4  CL 105  CO2 22  GLUCOSE 152*  BUN 21*  CREATININE 1.03*  CALCIUM 9.3   Cardiac Enzymes  Recent Labs  03/05/15 2012 03/06/15 0111  TROPONINI 0.04* 0.04*    TELE  NSR with occasion irregular rhythm  Radiology/Studies  Dg Chest Portable 1 View  03/05/2015   CLINICAL DATA:   Chest pain today. History of asthma and atrial fibrillation. Initial encounter.  EXAM: PORTABLE CHEST - 1 VIEW  COMPARISON:  01/25/2015 and 09/03/2014.  FINDINGS: 1348 hours. Heart size is at the upper limits of normal and stable for portable AP technique. Aortic atherosclerosis noted. The lungs are clear. There is no pleural effusion or pneumothorax. The bones appear unchanged. Telemetry leads overlie the chest.  IMPRESSION: Stable chest.  No acute cardiopulmonary process.   Electronically Signed   By: Richardean Sale M.D.   On: 03/05/2015 14:13    ASSESSMENT AND PLAN  1. Unstable angina - No further chest pain, Troponin flat trend --> will get Myoview today - Pending echo - Coumadin on hold if requires cath. If she were to require catheterization, she would require a heparin bridge secondary to history of TIA. No beta blocker in the setting of prior intolerance related to asthma. - continue ASA and statin  2. PAF - Currently in NSR with occasionally in irregular rhythm - Continue digoxin - no bb 2/2 asthma - Coumadin as above  3. HTN - Continue  amlodipine-valsartan - BP now stable  4. HL - Continue low dose statin. Prior intolerance to higher dose.    Signed, Bhagat,Bhavinkumar PA-C Pager [phone]  History and all data above reviewed.  Patient examined.  I agree with the findings as above.  No further chest pain.  No SOB.  The patient exam reveals COR:RRR  ,  Lungs: Clear  ,  Abd: Positive bowel sounds, no rebound no guarding, Ext No edema  .  All available labs, radiology testing, previous records reviewed. Agree with documented assessment and plan. Chest pain:  Negative stress perfusions study.  OK to go home.  She can follow up with Dr. Radford Pax next week and be considered for a follow up out patient echocardiogram.    Minus Breeding  3:59 PM  03/06/2015

## 2015-03-06 NOTE — Discharge Instructions (Signed)

## 2015-03-06 NOTE — Discharge Summary (Signed)
Discharge Summary   Patient ID: [name],  MRN: [id number], DOB/AGE: 1924-04-03 81 y.o.  Admit date: 03/05/2015 Discharge date: 03/06/2015  Primary Care Provider: No PCP Per Patient Primary Cardiologist: Dr. Radford Pax  Discharge Diagnoses Active Problems:   Unstable angina   Allergies Allergies  Allergen Reactions  . Alphagan [Brimonidine] Other (See Comments)    Burning sensation  . Biaxin [Clarithromycin]   . Brovana [Arformoterol] Other (See Comments)    Makes my heart race  . Budesonide Other (See Comments)    Makes my heart race  . Cefdinir     "FEEL HORRIBLE"  . Cephalexin Itching  . Clonidine Derivatives Other (See Comments)    REACTION: unknown  . Iohexol Other (See Comments)     Desc: NOTES FROM PRIOR CT STATES PRE MEDICATION PRIOR TO OMNIPAQUE ENHANCED CT   . Nsaids   . Penicillins Itching and Swelling    Swelling to ears  . Sulfamethoxazole   . Sulfonamide Derivatives Itching  . Tetracycline Itching  . Vancomycin Other (See Comments)    Turn red  . Zetia [Ezetimibe]   . Cardizem [Diltiazem Hcl] Rash    Procedures  Lexsican Myoview 03/06/15  No T wave inversion was noted during stress.  There was no ST segment deviation noted during stress.  The study is normal.  This is a low risk study.  The left ventricular ejection fraction is hyperdynamic (>65%).  History of Present Illness   81 y/o female with a h/o CAD s/p PCI of the Diag, PAF on chronic coumadin, HTN, TIA, asthma, and HL. She was last seen in cardiology clinic in March, @ which time she was doing well w/o chest pain. She lives locally with her dog and is very independent by herself at home. She reports intermittent dyspnea on exertion, which she attributes to sinus congestion from time to time. Activity is also somewhat limited by left knee pain, for which she receives steroids injections. 03/04/15, she received a steroids injection secondary to sinus congestion. She says  that as is usual following intramuscular steroids injections, she has been having some sinus tachycardia since then.  03/05/15, she got up late at about 9:30. About an hour later, after having eaten breakfast, she developed 4-5/10 substernal dull aching without associated symptoms. She did notice that when she got up to walk to the bathroom, she was more short of breath than usual but the dull aching did not worsen any. After about 30 minutes of ongoing aching discomfort that was not worse with deep breathing, palpation, position changes, or swallowing, she called her daughter and then subsequently EMS. En route to the emergency department, she was treated with sublingual nitroglycerin without significant change in 4-5 out of 10 dull aching discomfort.  In ED, she wastachycardic (sinus) and hemodynamically stable. ECG shows mild inferolateral ST depression, however this is similar to prior ECGs. Chest pain resolved completely at approximately 1:30 PM, 3 hours after onset. Initial troponin was normal. No beta blocker in the setting of prior intolerance related to asthma.  Hospital Course  She was admitted to observe, cycle cardiac markers and get echo. She is not typically on aspirin in the setting of chronic coumadin anticoagulation however, coumadin was held in case she requires catheterization. INR was 2.5. She was not heparinized during admission. Continued home dose of amlodipine-valsartan and statin. Overnight no chest pain. Troponin flat trend 0.04->0.04->0.03. Lexiscan was low risk with LV EF of more than 65%. Plan was to get outpatient echo.  At time of discharge noted that echo tech was performing study. She felt stable to go home and will discuss echo result during outpatient visit early September. She will resume home dose of coumadin. No change in medication during admission.     Discharge Vitals Blood pressure 119/52, pulse 60, temperature 97.5 F (36.4 C), temperature source Oral, resp.  rate 20, height 5\' 6"  (1.676 m), weight 137 lb 14.4 oz (62.551 kg), SpO2 99 %.  Filed Weights   03/06/15 0616  Weight: 137 lb 14.4 oz (62.551 kg)    Labs  CBC  Recent Labs  03/05/15 1354  WBC 23.1*  NEUTROABS 20.3*  HGB 12.7  HCT 37.6  MCV 86.6  PLT 161   Basic Metabolic Panel  Recent Labs  03/05/15 1354  NA 136  K 4.4  CL 105  CO2 22  GLUCOSE 152*  BUN 21*  CREATININE 1.03*  CALCIUM 9.3   Cardiac Enzymes  Recent Labs  03/05/15 2012 03/06/15 0111 03/06/15 0800  TROPONINI 0.04* 0.04* 0.03    Disposition  Pt is being discharged home today in good condition.  Follow-up Plans & Appointments  Follow-up Information    Follow up with Sueanne Margarita, MD. Go on 03/15/2015.   Specialty:  Cardiology   Why:  @11 :15 am cardiology f/u   Contact information:   [address] [address] [phone]           Discharge Instructions    Diet - low sodium heart healthy    Complete by:  As directed      Discharge instructions    Complete by:  As directed   No change in home medication. Resume as schedule previously. Will discuss echo result during office visit.     Increase activity slowly    Complete by:  As directed            F/u Labs/Studies: None  Discharge Medications    Medication List    TAKE these medications        acetaminophen 325 MG tablet  Commonly known as:  TYLENOL  Take 325 mg by mouth every 6 (six) hours as needed for moderate pain.     albuterol 108 (90 BASE) MCG/ACT inhaler  Commonly known as:  PROVENTIL HFA;VENTOLIN HFA  Inhale 1-2 puffs into the lungs every 4 (four) hours as needed for wheezing or shortness of breath.     amLODipine-valsartan 10-320 MG per tablet  Commonly known as:  EXFORGE  Take 1 tablet by mouth daily.     cetirizine 10 MG tablet  Commonly known as:  ZYRTEC  Take 10 mg by mouth daily as needed for allergies.     digoxin 0.125 MG tablet  Commonly known as:  LANOXIN  Take 1  tablet (125 mcg total) by mouth daily.     fluticasone 50 MCG/ACT nasal spray  Commonly known as:  FLONASE  Place 1 spray into both nostrils daily as needed for allergies or rhinitis.     Fluticasone-Salmeterol 250-50 MCG/DOSE Aepb  Commonly known as:  ADVAIR DISKUS  Inhale 1 puff into the lungs 2 (two) times daily.     furosemide 40 MG tablet  Commonly known as:  LASIX  Take 1 tablet (40 mg total) by mouth daily as needed for fluid.     latanoprost 0.005 % ophthalmic solution  Commonly known as:  XALATAN  Place 1 drop into both eyes at bedtime.     simvastatin 5 MG  tablet  Commonly known as:  ZOCOR  Take 5 mg by mouth daily as needed. Patient states she takes whenever she needs it for cholesterol. Per patient     traMADol 50 MG tablet  Commonly known as:  ULTRAM  Take 50 mg by mouth every 6 (six) hours as needed for moderate pain.     warfarin 1 MG tablet  Commonly known as:  COUMADIN  Take 1-1.5 mg by mouth daily. Monday and Friday take 1mg  rest of days take 1.5mg  tab per patient        Duration of Discharge Encounter   Greater than 30 minutes including physician time.  Signed, Bhagat,Bhavinkumar PA-C 03/06/2015, 4:21 PM    Patient seen and examined.  Plan as discussed in my rounding note for today and outlined above. Minus Breeding  03/06/2015  5:39 PM

## 2015-03-07 ENCOUNTER — Telehealth: Payer: Self-pay | Admitting: Cardiology

## 2015-03-07 NOTE — Telephone Encounter (Signed)
D/C phone call.[name] Appt is on 03/15/15 at 11:15am w/ Dr. Fransico Him at [facility] .[name]    Thanks

## 2015-03-12 NOTE — Telephone Encounter (Signed)
Patient contacted regarding discharge from [facility] on 03/06/2015.  Patient understands to follow up with provider Dr. Radford Pax on 03/15/15 at 11:15am at [facility]. Patient understands discharge instructions? YES Patient understands medications and regiment? YES Patient understands to bring all medications to this visit? YES

## 2015-03-15 ENCOUNTER — Encounter: Payer: Self-pay | Admitting: Cardiology

## 2015-03-15 ENCOUNTER — Ambulatory Visit (INDEPENDENT_AMBULATORY_CARE_PROVIDER_SITE_OTHER): Payer: Medicare HMO | Admitting: Cardiology

## 2015-03-15 ENCOUNTER — Telehealth: Payer: Self-pay | Admitting: Cardiology

## 2015-03-15 VITALS — BP 140/58 | HR 84 | Ht 66.0 in | Wt 141.8 lb

## 2015-03-15 DIAGNOSIS — E785 Hyperlipidemia, unspecified: Secondary | ICD-10-CM | POA: Diagnosis not present

## 2015-03-15 DIAGNOSIS — I10 Essential (primary) hypertension: Secondary | ICD-10-CM | POA: Diagnosis not present

## 2015-03-15 DIAGNOSIS — I25119 Atherosclerotic heart disease of native coronary artery with unspecified angina pectoris: Secondary | ICD-10-CM | POA: Diagnosis not present

## 2015-03-15 DIAGNOSIS — I480 Paroxysmal atrial fibrillation: Secondary | ICD-10-CM | POA: Diagnosis not present

## 2015-03-15 NOTE — Patient Instructions (Addendum)
Medication Instructions: - no changes  Labwork: - Your physician recommends that you return for FASTING lab work in: 1 week- cbc/liver/lipid/ digoxin  Procedures/Testing: - none  Follow-Up: - Your physician wants you to follow-up in: 6 months with Dr. Radford Pax (March 2017). You will receive a reminder letter in the mail two months in advance. If you don't receive a letter, please call our office to schedule the follow-up appointment.  Any Additional Special Instructions Will Be Listed Below (If Applicable). - none

## 2015-03-15 NOTE — Telephone Encounter (Signed)
New Message  IRMA from Dr. Willette Pa office wants the notes from dr. Visit faxed over   [phone]

## 2015-03-15 NOTE — Progress Notes (Signed)
Cardiology Office Note   Date:  03/15/2015   ID:  [name], DOB [birth year], MRN [id number]  PCP:  No PCP Per Patient    Chief Complaint  Patient presents with  . PAF      History of Present Illness: [name] is a 81 y.o. female with a history of ASCAD/PAF/HTN/dyslipidemia who presents today for followup. She was recently in the hospital with [facility]. Her troponin was minimally elevated with a flat trend and she underwent lexiscan myoview which showed no ischemia and normal LVF.  2D echo was normal.   She is doing well. She denies any further chest pain.  She denies any dizziness, palpitations or syncope. She has chronic SOB due to asthma which is stable. She has chronic LE edema.    Past Medical History  Diagnosis Date  . Esophageal reflux   . Glaucoma   . Diverticulosis   . PAF (paroxysmal atrial fibrillation)     a. intolerant to beta blockers due to asthma and intolerant to CCB due to rash;  b. CHA2DS2VASc = 6-->chronic coumadin;  c. 03/2014 Echo: EF 60-65%, Gr 1 DD, triv AI, mildly dil RV, PASP 40mmhg.  . HTN (hypertension), benign   . COPD/Asthma   . CAD (coronary artery disease) 2007    a. 1997 s/p PCI of diagonal.  . Hyperlipidemia   . TIA (transient ischemic attack)     a. 02/2011 - chronic coumadin in setting of PAF.  . Malignant melanoma     a. 09/2012 s/p resection.    Past Surgical History  Procedure Laterality Date  . Appendectomy    . Vesicovaginal fistula closure w/ tah    . Nasal sinus surgery    . Cholecystectomy    . Coronary angioplasty with stent placement    . Abdominal hysterectomy    . Eye surgery       Current Outpatient Prescriptions  Medication Sig Dispense Refill  . acetaminophen (TYLENOL) 325 MG tablet Take 325 mg by mouth every 6 (six) hours as needed for moderate pain.     [name] albuterol (PROVENTIL HFA;VENTOLIN HFA) 108 (90 BASE) MCG/ACT inhaler Inhale 1-2 puffs into the lungs every 4 (four)  hours as needed for wheezing or shortness of breath. 1 Inhaler 3  . amLODipine-valsartan (EXFORGE) 10-320 MG per tablet Take 1 tablet by mouth daily. 90 tablet 3  . cetirizine (ZYRTEC) 10 MG tablet Take 10 mg by mouth daily as needed for allergies.    [name] digoxin (LANOXIN) 0.125 MG tablet Take 1 tablet (125 mcg total) by mouth daily. 90 tablet 3  . fluticasone (FLONASE) 50 MCG/ACT nasal spray Place 1 spray into both nostrils daily as needed for allergies or rhinitis. 16 g 6  . Fluticasone-Salmeterol (ADVAIR DISKUS) 250-50 MCG/DOSE AEPB Inhale 1 puff into the lungs 2 (two) times daily. 60 each 3  . furosemide (LASIX) 40 MG tablet Take 1 tablet (40 mg total) by mouth daily as needed for fluid. 30 tablet 1  . latanoprost (XALATAN) 0.005 % ophthalmic solution Place 1 drop into both eyes at bedtime.    . simvastatin (ZOCOR) 5 MG tablet Take 5 mg by mouth daily as needed. Patient states she takes whenever she needs it for cholesterol. Per patient    . warfarin (COUMADIN) 1 MG tablet Take 1-1.5 mg by mouth daily. Monday and Friday take 1mg  rest of days take 1.5mg  tab  per patient     No current facility-administered medications for this visit.    Allergies:   Alphagan; Biaxin; Brovana; Budesonide; Cefdinir; Cephalexin; Cifenline; Clonidine derivatives; Iohexol; Nsaids; Penicillins; Sulfamethoxazole; Sulfonamide derivatives; Tetracycline; Timolol; Vancomycin; Zetia; and Cardizem    Social History:  The patient  reports that she quit smoking about 48 years ago. Her smoking use included Cigarettes. She has a 5 pack-year smoking history. She does not have any smokeless tobacco history on file. She reports that she does not drink alcohol or use illicit drugs.   Family History:  The patient's family history includes Cancer in her sister; Colon cancer in her mother; Heart attack in her brother and father; Heart disease in her brother and father; Hemachromatosis in her father; Lymphoma in her sister; Peripheral  vascular disease in her mother and sister.    ROS:  Please see the history of present illness.   Otherwise, review of systems are positive for none.   All other systems are reviewed and negative.    PHYSICAL EXAM: VS:  BP 140/58 mmHg  Pulse 84  Ht 5\' 6"  (1.676 m)  Wt 141 lb 12.8 oz (64.32 kg)  BMI 22.90 kg/m2  SpO2 98% , BMI Body mass index is 22.9 kg/(m^2). GEN: Well nourished, well developed, in no acute distress HEENT: normal Neck: no JVD, carotid bruits, or masses Cardiac: RRR; no murmurs, rubs, or gallops,no edema  Respiratory:  clear to auscultation bilaterally, normal work of breathing GI: soft, nontender, nondistended, + BS MS: no deformity or atrophy Skin: warm and dry, no rash Neuro:  Strength and sensation are intact Psych: euthymic mood, full affect   EKG:  EKG is not ordered today.    Recent Labs: 05/25/2014: ALT 16 03/05/2015: BUN 21*; Creatinine, Ser 1.03*; Hemoglobin 12.7; Platelets 219; Potassium 4.4; Sodium 136    Lipid Panel    Component Value Date/Time   CHOL 166 05/25/2014 0954   TRIG 106.0 05/25/2014 0954   HDL 60.00 05/25/2014 0954   CHOLHDL 3 05/25/2014 0954   VLDL 21.2 05/25/2014 0954   LDLCALC 85 05/25/2014 0954      Wt Readings from Last 3 Encounters:  03/15/15 141 lb 12.8 oz (64.32 kg)  03/06/15 137 lb 14.4 oz (62.551 kg)  12/05/14 138 lb 12.8 oz (62.959 kg)    ASSESSMENT AND PLAN:  1. ASCAD with recent episode of CP (shortly after eating) now with no further angina.  Nuclear stress test with no ischemia and 2D echo was normal.  She is not on ASA due to warfarin 2. HTN - controlled - continue amlodipine/Valsartan  3. Dyslipidemia - cannot tolerate higher dose of statin - continue simvastatin 5mg  daily - check FLP and ALT 4. PAF maintaining NSR - continue Warfarin and lanoxin  - check dig level       5.  Leukocytosis in hospital - will recheck today to make sure it has resolved    Current medicines are reviewed at length  with the patient today.  The patient does not have concerns regarding medicines.  The following changes have been made:  no change  Labs/ tests ordered today: See above Assessment and Plan No orders of the defined types were placed in this encounter.     Disposition:   FU with me in 6 months  Signed, Sueanne Margarita, MD  03/15/2015 11:29 AM    [facility] [address], [place], [place]  [address] Phone: [phone]; Fax: [phone]

## 2015-03-15 NOTE — Telephone Encounter (Signed)
Spoke with Irma at Dr. Ival Bible office and confirmed fax number.   To Medical Records.

## 2015-03-22 ENCOUNTER — Other Ambulatory Visit (INDEPENDENT_AMBULATORY_CARE_PROVIDER_SITE_OTHER): Payer: Medicare HMO | Admitting: *Deleted

## 2015-03-22 DIAGNOSIS — E785 Hyperlipidemia, unspecified: Secondary | ICD-10-CM | POA: Diagnosis not present

## 2015-03-22 DIAGNOSIS — I25119 Atherosclerotic heart disease of native coronary artery with unspecified angina pectoris: Secondary | ICD-10-CM | POA: Diagnosis not present

## 2015-03-22 DIAGNOSIS — I480 Paroxysmal atrial fibrillation: Secondary | ICD-10-CM

## 2015-03-22 LAB — CBC WITH DIFFERENTIAL/PLATELET
Basophils Absolute: 0 10*3/uL (ref 0.0–0.1)
Basophils Relative: 0.5 % (ref 0.0–3.0)
EOS PCT: 9.3 % — AB (ref 0.0–5.0)
Eosinophils Absolute: 0.9 10*3/uL — ABNORMAL HIGH (ref 0.0–0.7)
HCT: 37.9 % (ref 36.0–46.0)
Hemoglobin: 12.3 g/dL (ref 12.0–15.0)
LYMPHS ABS: 2.5 10*3/uL (ref 0.7–4.0)
Lymphocytes Relative: 25 % (ref 12.0–46.0)
MCHC: 32.5 g/dL (ref 30.0–36.0)
MCV: 87.9 fl (ref 78.0–100.0)
MONO ABS: 0.6 10*3/uL (ref 0.1–1.0)
Monocytes Relative: 6.4 % (ref 3.0–12.0)
NEUTROS ABS: 5.8 10*3/uL (ref 1.4–7.7)
NEUTROS PCT: 58.8 % (ref 43.0–77.0)
PLATELETS: 205 10*3/uL (ref 150.0–400.0)
RBC: 4.31 Mil/uL (ref 3.87–5.11)
RDW: 15.3 % (ref 11.5–15.5)
WBC: 9.9 10*3/uL (ref 4.0–10.5)

## 2015-03-22 LAB — LIPID PANEL
CHOLESTEROL: 142 mg/dL (ref 0–200)
HDL: 55.5 mg/dL (ref >39.00)
LDL Cholesterol: 57 mg/dL (ref 0–99)
NonHDL: 86.1
TRIGLYCERIDES: 148 mg/dL (ref 0.0–149.0)
Total CHOL/HDL Ratio: 3
VLDL: 29.6 mg/dL (ref 0.0–40.0)

## 2015-03-22 LAB — HEPATIC FUNCTION PANEL
ALBUMIN: 3.7 g/dL (ref 3.5–5.2)
ALT: 11 U/L (ref 0–35)
AST: 17 U/L (ref 0–37)
Alkaline Phosphatase: 48 U/L (ref 39–117)
BILIRUBIN TOTAL: 0.7 mg/dL (ref 0.2–1.2)
Bilirubin, Direct: 0.1 mg/dL (ref 0.0–0.3)
Total Protein: 6.7 g/dL (ref 6.0–8.3)

## 2015-03-23 LAB — DIGOXIN LEVEL: DIGOXIN LVL: 0.7 ug/L — AB (ref 0.8–2.0)

## 2015-04-03 ENCOUNTER — Encounter: Payer: Medicare HMO | Admitting: Nurse Practitioner

## 2015-04-09 ENCOUNTER — Ambulatory Visit (INDEPENDENT_AMBULATORY_CARE_PROVIDER_SITE_OTHER): Payer: Medicare HMO | Admitting: *Deleted

## 2015-04-09 DIAGNOSIS — Z5181 Encounter for therapeutic drug level monitoring: Secondary | ICD-10-CM | POA: Diagnosis not present

## 2015-04-09 DIAGNOSIS — I480 Paroxysmal atrial fibrillation: Secondary | ICD-10-CM

## 2015-04-09 LAB — POCT INR: INR: 3.5

## 2015-04-23 ENCOUNTER — Ambulatory Visit (INDEPENDENT_AMBULATORY_CARE_PROVIDER_SITE_OTHER): Payer: Medicare HMO

## 2015-04-23 DIAGNOSIS — Z5181 Encounter for therapeutic drug level monitoring: Secondary | ICD-10-CM | POA: Diagnosis not present

## 2015-04-23 DIAGNOSIS — I480 Paroxysmal atrial fibrillation: Secondary | ICD-10-CM

## 2015-04-23 LAB — POCT INR: INR: 3.2

## 2015-05-14 ENCOUNTER — Ambulatory Visit (INDEPENDENT_AMBULATORY_CARE_PROVIDER_SITE_OTHER): Payer: Medicare HMO | Admitting: *Deleted

## 2015-05-14 DIAGNOSIS — Z5181 Encounter for therapeutic drug level monitoring: Secondary | ICD-10-CM

## 2015-05-14 DIAGNOSIS — I480 Paroxysmal atrial fibrillation: Secondary | ICD-10-CM | POA: Diagnosis not present

## 2015-05-14 LAB — POCT INR: INR: 2.3

## 2015-06-07 ENCOUNTER — Other Ambulatory Visit: Payer: Medicare HMO

## 2015-06-07 ENCOUNTER — Encounter: Payer: Self-pay | Admitting: Pulmonary Disease

## 2015-06-07 ENCOUNTER — Ambulatory Visit (INDEPENDENT_AMBULATORY_CARE_PROVIDER_SITE_OTHER): Payer: Medicare HMO | Admitting: Pulmonary Disease

## 2015-06-07 VITALS — BP 140/60 | HR 82 | Ht 66.5 in | Wt 139.0 lb

## 2015-06-07 DIAGNOSIS — J45909 Unspecified asthma, uncomplicated: Secondary | ICD-10-CM

## 2015-06-07 DIAGNOSIS — Z23 Encounter for immunization: Secondary | ICD-10-CM

## 2015-06-07 DIAGNOSIS — J301 Allergic rhinitis due to pollen: Secondary | ICD-10-CM

## 2015-06-07 DIAGNOSIS — J449 Chronic obstructive pulmonary disease, unspecified: Secondary | ICD-10-CM | POA: Diagnosis not present

## 2015-06-07 MED ORDER — PREDNISONE 10 MG PO TABS: ORAL_TABLET | ORAL | Status: DC

## 2015-06-07 NOTE — Addendum Note (Signed)
Addended by: Len Blalock on: 06/07/2015 04:14 PM   Modules accepted: Orders

## 2015-06-07 NOTE — Patient Instructions (Signed)
For the sinus problem: Use Neil Med rinses with distilled water at least twice per day using the instructions on the package. Use generic zyrtec (cetirizine) every day.   I will call in a brief course of prednisone  We will start applying for Xolair  I will see back in February 2017 or sooner if needed

## 2015-06-07 NOTE — Assessment & Plan Note (Signed)
She had an attack of bronchitis and asthma back in about 4 weeks ago. Even though she has moderate airflow obstruction she has a severe phenotype with recurrent exacerbations. Today she is no longer wheezing.  Plan: Continue Advair See allergic rhinitis

## 2015-06-07 NOTE — Assessment & Plan Note (Signed)
She is having another flare of sinusitis which is due to the poor air quality recently. Her symptoms of asthma and sinusitis were very well controlled while taking Xolair. Unfortunately she lost financial assistance for this because of a clerical air. She is in need of another round of prednisone right now, but the problem is at age 81 repeated doses of prednisone are not good for her bones. She is unable to take a nasal steroid because of glaucoma which apparently has been worsening recently.  Plan: Saline rinses encourage I encouraged her to use Zyrtec every day not as needed We will apply for Xolair once again Prednisone 5 days

## 2015-06-07 NOTE — Addendum Note (Signed)
Addended by: Len Blalock on: 06/07/2015 03:35 PM   Modules accepted: Orders

## 2015-06-07 NOTE — Progress Notes (Signed)
Subjective:    Patient ID: JAHMIYA MCLAIN, female    DOB: September 19, 1923, 81 y.o.   MRN: [id number]  Synopsis: Former patient of Dr. Gwenette Greet with asthma and COPD.  She started smoking as a child and smoked until age 33, but she smoked only about 1 cigarette per week.  She started having lung problems in 2000. PFT's 2009:  FEV1 1.23 (64%), ratio 57, DLCO 67%. No change with singulair or higher dose advair Rast 2006:  IgE 855, multiple significant allergens. On Xolair with good clinical response >> d/ced 08/2012 due to expense.  Cannot tolerate nebulized bronchodilators for maintenance +nasal polyps per ENT evaluation >> [place].  Recommended surgery but pt declined. On topical nasal steroids.   Elevated Eos on CBC 08/2014  HPI Chief Complaint  Patient presents with  . Follow-up    Former [place] pt being treated for chronic obstructive airway disease with asthma. Pt c/o sinus congestion, PND, throat clearing with clear mucus X2 weeks.      She says that lately she is having more trouble with her sinuses lately.  She had a bronchitis flare a few weeks ago and she was given a shot of some sort and this really helped.  She is not having facial pain but she does have a headache.  She takes tylenol for the pain.  She is prescribed flonase but she is not taking it right now because of glaucoma.  She tried sinuglair in the past but it didn't help much. She has been told that she has a deviated septum and she refused sugery.  The breathing has been OK lately, but she get more dyspnea when she exerts herself.   She uses saline rinses wi  Past Medical History  Diagnosis Date  . Esophageal reflux   . Glaucoma   . Diverticulosis   . PAF (paroxysmal atrial fibrillation) (HCC)     a. intolerant to beta blockers due to asthma and intolerant to CCB due to rash;  b. CHA2DS2VASc = 6-->chronic coumadin;  c. 03/2014 Echo: EF 60-65%, Gr 1 DD, triv AI, mildly dil RV, PASP 8mmhg.  . HTN (hypertension),  benign   . COPD/Asthma   . CAD (coronary artery disease) 2007    a. 1997 s/p PCI of diagonal.  . Hyperlipidemia   . TIA (transient ischemic attack)     a. 02/2011 - chronic coumadin in setting of PAF.  . Malignant melanoma ([place])     a. 09/2012 s/p resection.      Review of Systems  Constitutional: Negative for fever, chills and fatigue.  HENT: Positive for postnasal drip, rhinorrhea and sinus pressure.   Respiratory: Negative for cough, shortness of breath and wheezing.   Cardiovascular: Negative for chest pain, palpitations and leg swelling.       Objective:   Physical Exam Filed Vitals:   06/07/15 1441  BP: 140/60  Pulse: 82  Height: 5' 6.5" (1.689 m)  Weight: 139 lb (63.05 kg)  SpO2: 94%   RA  Gen: well appearing HENT: OP clear, TM's clear, neck supple PULM: CTA B, normal percussion CV: RRR, no mgr, trace edema GI: BS+, soft, nontender Derm: no cyanosis or rash Psyche: normal mood and affect   August 2016 hospital discharge summary reviewed where she was briefly admitted for unstable angina and manage medically Chest x-ray images from August 2016 personally reviewed showing cardiomegaly but no pulmonary infiltrate     Assessment & Plan:  Chronic obstructive airway disease with asthma ([place]) She  had an attack of bronchitis and asthma back in about 4 weeks ago. Even though she has moderate airflow obstruction she has a severe phenotype with recurrent exacerbations. Today she is no longer wheezing.  Plan: Continue Advair See allergic rhinitis  Allergic rhinitis due to pollen She is having another flare of sinusitis which is due to the poor air quality recently. Her symptoms of asthma and sinusitis were very well controlled while taking Xolair. Unfortunately she lost financial assistance for this because of a clerical air. She is in need of another round of prednisone right now, but the problem is at age 61 repeated doses of prednisone are not good for her bones.  She is unable to take a nasal steroid because of glaucoma which apparently has been worsening recently.  Plan: Saline rinses encourage I encouraged her to use Zyrtec every day not as needed We will apply for Xolair once again Prednisone 5 days     Current outpatient prescriptions:  .  acetaminophen (TYLENOL) 325 MG tablet, Take 325 mg by mouth every 6 (six) hours as needed for moderate pain. , Disp: , Rfl:  .  albuterol (PROVENTIL HFA;VENTOLIN HFA) 108 (90 BASE) MCG/ACT inhaler, Inhale 1-2 puffs into the lungs every 4 (four) hours as needed for wheezing or shortness of breath., Disp: 1 Inhaler, Rfl: 3 .  amLODipine-valsartan (EXFORGE) 10-320 MG per tablet, Take 1 tablet by mouth daily., Disp: 90 tablet, Rfl: 3 .  cetirizine (ZYRTEC) 10 MG tablet, Take 10 mg by mouth daily as needed for allergies., Disp: , Rfl:  .  digoxin (LANOXIN) 0.125 MG tablet, Take 1 tablet (125 mcg total) by mouth daily., Disp: 90 tablet, Rfl: 3 .  fluticasone (FLONASE) 50 MCG/ACT nasal spray, Place 1 spray into both nostrils daily as needed for allergies or rhinitis., Disp: 16 g, Rfl: 6 .  Fluticasone-Salmeterol (ADVAIR DISKUS) 250-50 MCG/DOSE AEPB, Inhale 1 puff into the lungs 2 (two) times daily., Disp: 60 each, Rfl: 3 .  furosemide (LASIX) 40 MG tablet, Take 1 tablet (40 mg total) by mouth daily as needed for fluid., Disp: 30 tablet, Rfl: 1 .  latanoprost (XALATAN) 0.005 % ophthalmic solution, Place 1 drop into both eyes at bedtime., Disp: , Rfl:  .  simvastatin (ZOCOR) 5 MG tablet, Take 5 mg by mouth daily as needed. Patient states she takes whenever she needs it for cholesterol. Per patient, Disp: , Rfl:  .  warfarin (COUMADIN) 1 MG tablet, Take 1-1.5 mg by mouth daily. Monday and Friday take 1mg  rest of days take 1.5mg  tab per patient, Disp: , Rfl:  .  predniSONE (DELTASONE) 10 MG tablet, 20mg  daily for 5 days, Disp: 10 tablet, Rfl: 0

## 2015-06-10 LAB — IGE: IGE (IMMUNOGLOBULIN E), SERUM: 872 kU/L — AB (ref <115)

## 2015-06-11 ENCOUNTER — Ambulatory Visit (INDEPENDENT_AMBULATORY_CARE_PROVIDER_SITE_OTHER): Payer: Medicare HMO

## 2015-06-11 ENCOUNTER — Telehealth: Payer: Self-pay | Admitting: *Deleted

## 2015-06-11 DIAGNOSIS — I480 Paroxysmal atrial fibrillation: Secondary | ICD-10-CM | POA: Diagnosis not present

## 2015-06-11 DIAGNOSIS — Z5181 Encounter for therapeutic drug level monitoring: Secondary | ICD-10-CM | POA: Diagnosis not present

## 2015-06-11 LAB — POCT INR: INR: 2.5

## 2015-06-11 NOTE — Telephone Encounter (Signed)
Per Joellen Jersey, she has already started Xolair process for this patient.   She requested to keep in her box until complete.

## 2015-06-11 NOTE — Telephone Encounter (Signed)
Patient notified of results and would like to know what to do next.   Do you want to go ahead and start her on Xolair process?  Please advise.

## 2015-06-11 NOTE — Telephone Encounter (Signed)
-----   Message from Juanito Doom, MD sent at 06/11/2015  3:18 AM EST ----- A, Please let her know that this was elevated, which means she will likely do well with Xolair. Thanks B

## 2015-06-11 NOTE — Telephone Encounter (Signed)
Pt signed PAN forms and I have started the re-enrollment process for Xolair again. I will keep pt and MD updated as I get new updates.

## 2015-07-09 ENCOUNTER — Ambulatory Visit (INDEPENDENT_AMBULATORY_CARE_PROVIDER_SITE_OTHER): Payer: Medicare HMO | Admitting: *Deleted

## 2015-07-09 ENCOUNTER — Telehealth: Payer: Self-pay | Admitting: Pulmonary Disease

## 2015-07-09 DIAGNOSIS — Z5181 Encounter for therapeutic drug level monitoring: Secondary | ICD-10-CM

## 2015-07-09 DIAGNOSIS — I480 Paroxysmal atrial fibrillation: Secondary | ICD-10-CM

## 2015-07-09 LAB — POCT INR: INR: 3.5

## 2015-07-09 NOTE — Telephone Encounter (Signed)
Caryl Pina has SMN for BQ to sign; he has been out of the office for the holidays.

## 2015-07-11 NOTE — Telephone Encounter (Signed)
Form signed by BQ and returned to [facility].  Forwarding to Katie to follow up on.

## 2015-07-12 NOTE — Telephone Encounter (Signed)
Papers and Insurance info being faxed back today to [place]. Thanks.

## 2015-07-16 NOTE — Telephone Encounter (Signed)
[name] please advise 

## 2015-07-17 NOTE — Telephone Encounter (Signed)
Do I need to do anything with this? Otherwise please take me out of string.

## 2015-07-23 ENCOUNTER — Ambulatory Visit (INDEPENDENT_AMBULATORY_CARE_PROVIDER_SITE_OTHER): Payer: Medicare HMO | Admitting: Pharmacist

## 2015-07-23 DIAGNOSIS — Z5181 Encounter for therapeutic drug level monitoring: Secondary | ICD-10-CM | POA: Diagnosis not present

## 2015-07-23 DIAGNOSIS — I480 Paroxysmal atrial fibrillation: Secondary | ICD-10-CM

## 2015-07-23 LAB — POCT INR: INR: 2.4

## 2015-07-23 NOTE — Telephone Encounter (Signed)
Routing back to [place] to follow up on.

## 2015-07-24 NOTE — Telephone Encounter (Signed)
Waiting on Benefits of summary from [place]. Will keep updated as I get them.

## 2015-07-30 ENCOUNTER — Other Ambulatory Visit: Payer: Self-pay | Admitting: Pulmonary Disease

## 2015-07-31 NOTE — Telephone Encounter (Signed)
Have you received the pt's summary of benefits? Thanks.

## 2015-08-07 NOTE — Telephone Encounter (Signed)
Katie, please advise on status of this thanks

## 2015-08-08 NOTE — Telephone Encounter (Signed)
Spoke with [employer]-they had to get correct terms for patient's insurance plans and which would be more cost efficient for the patient. [name] will keep case open until further notice. Pt is aware of status of Xolair. She is aware of her cost and that a PA is being completed at this time.

## 2015-08-13 NOTE — Telephone Encounter (Signed)
PA is in progress; will update as I receive updates. Thanks.

## 2015-08-13 NOTE — Telephone Encounter (Signed)
Katie can you give an update on this? Thanks. 

## 2015-08-19 ENCOUNTER — Ambulatory Visit (INDEPENDENT_AMBULATORY_CARE_PROVIDER_SITE_OTHER): Payer: Medicare HMO | Admitting: Pulmonary Disease

## 2015-08-19 ENCOUNTER — Encounter: Payer: Self-pay | Admitting: Pulmonary Disease

## 2015-08-19 VITALS — BP 128/62 | HR 66 | Ht 66.5 in | Wt 136.8 lb

## 2015-08-19 DIAGNOSIS — J301 Allergic rhinitis due to pollen: Secondary | ICD-10-CM | POA: Diagnosis not present

## 2015-08-19 DIAGNOSIS — J449 Chronic obstructive pulmonary disease, unspecified: Secondary | ICD-10-CM

## 2015-08-19 DIAGNOSIS — J45909 Unspecified asthma, uncomplicated: Secondary | ICD-10-CM

## 2015-08-19 NOTE — Assessment & Plan Note (Signed)
This has been a stable interval for her with her asthma, but in the last year she has had poorly controlled asthma.  Plan: Continue Advair

## 2015-08-19 NOTE — Progress Notes (Signed)
Subjective:    Patient ID: [name], female    DOB: 22-Nov-1923, 82 y.o.   MRN: [id number]  Synopsis: Former patient of Dr. Gwenette Dennis with asthma and COPD.  She started smoking as a child and smoked until age 20, but she smoked only about 1 cigarette per week.  She started having lung problems in 2000. PFT's 2009:  FEV1 1.23 (64%), ratio 57, DLCO 67%. No change with singulair or higher dose advair Rast 2006:  IgE 855, multiple significant allergens. On Xolair with good clinical response >> d/ced 08/2012 due to expense.  Cannot tolerate nebulized bronchodilators for maintenance +nasal polyps per ENT evaluation >> [name].  Recommended surgery but pt declined. On topical nasal steroids.   Elevated Eos on CBC 08/2014  HPI Chief Complaint  Patient presents with  . Follow-up    Pt c/o sinus issues last week. Saw PCP and was given abx injection (pt does not remember name). Pt states that she does feel some better, c/o mild sinus congestion, cough with clear mucus, and some hoarseness. Pt denies wheeze/SOB/CP/tightness.    [name] has been going OK.  She had a sinus infection last week and she received an antibiotic injection and a steroid injection.  She is now feeling better. Her breathing has been doing fairly well.  She notes that if she over exerts herself she feels some dyspnea when she walks.   We are applying for her Xolair now.  She has not had an asthma attack since the last visit.  She continues to take Advair regularly.   Past Medical History  Diagnosis Date  . Esophageal reflux   . Glaucoma   . Diverticulosis   . PAF (paroxysmal atrial fibrillation) (HCC)     a. intolerant to beta blockers due to asthma and intolerant to CCB due to rash;  b. CHA2DS2VASc = 6-->chronic coumadin;  c. 03/2014 Echo: EF 60-65%, Gr 1 DD, triv AI, mildly dil RV, PASP 3mmhg.  . HTN (hypertension), benign   . COPD/Asthma   . CAD (coronary artery disease) 2007    a. 1997 s/p PCI of diagonal.   . Hyperlipidemia   . TIA (transient ischemic attack)     a. 02/2011 - chronic coumadin in setting of PAF.  . Malignant melanoma ([place])     a. 09/2012 s/p resection.      Review of Systems  Constitutional: Negative for fever, chills and fatigue.  HENT: Positive for postnasal drip, rhinorrhea and sinus pressure.   Respiratory: Negative for cough, shortness of breath and wheezing.   Cardiovascular: Negative for chest pain, palpitations and leg swelling.       Objective:   Physical Exam Filed Vitals:   08/19/15 1147  BP: 128/62  Pulse: 66  Height: 5' 6.5" (1.689 m)  Weight: 136 lb 12.8 oz (62.052 kg)  SpO2: 97%   RA  Gen: well appearing HENT: OP clear, TM's clear, neck supple PULM: CTA B, normal percussion CV: RRR, no mgr, trace edema GI: BS+, soft, nontender Derm: no cyanosis or rash Psyche: normal mood and affect        Assessment & Plan:  Chronic obstructive airway disease with asthma ([place]) This has been a stable interval for her with her asthma, but in the last year she has had poorly controlled asthma.  Plan: Continue Advair  Allergic rhinitis due to pollen [name] has been struggling with multiple flare ups of her allergic rhinitis which require repeated doses of prednisone throughout the course of the  year. Sadly, she has our he had side effects from this in terms of worsening glaucoma and visual changes. I also worry about her long-term risk of having osteopenia versus osteoporosis from repeated uses of prednisone. We know that she does well with Xolair.  Plan: We are applying for financial assistance for Xolair     Current outpatient prescriptions:  .  acetaminophen (TYLENOL) 325 MG tablet, Take 325 mg by mouth every 6 (six) hours as needed for moderate pain. , Disp: , Rfl:  .  ADVAIR DISKUS 250-50 MCG/DOSE AEPB, INHALE ONE DOSE BY MOUTH TWICE DAILY, Disp: 60 each, Rfl: 5 .  albuterol (PROVENTIL HFA;VENTOLIN HFA) 108 (90 BASE) MCG/ACT inhaler, Inhale  1-2 puffs into the lungs every 4 (four) hours as needed for wheezing or shortness of breath., Disp: 1 Inhaler, Rfl: 3 .  ALPRAZolam (XANAX) 0.25 MG tablet, Take 0.25 mg by mouth at bedtime as needed for anxiety., Disp: , Rfl:  .  amLODipine-valsartan (EXFORGE) 10-320 MG per tablet, Take 1 tablet by mouth daily., Disp: 90 tablet, Rfl: 3 .  cetirizine (ZYRTEC) 10 MG tablet, Take 10 mg by mouth daily as needed for allergies., Disp: , Rfl:  .  digoxin (LANOXIN) 0.125 MG tablet, Take 1 tablet (125 mcg total) by mouth daily., Disp: 90 tablet, Rfl: 3 .  fluticasone (FLONASE) 50 MCG/ACT nasal spray, Place 1 spray into both nostrils daily as needed for allergies or rhinitis., Disp: 16 g, Rfl: 6 .  furosemide (LASIX) 40 MG tablet, Take 1 tablet (40 mg total) by mouth daily as needed for fluid., Disp: 30 tablet, Rfl: 1 .  latanoprost (XALATAN) 0.005 % ophthalmic solution, Place 1 drop into both eyes at bedtime., Disp: , Rfl:  .  simvastatin (ZOCOR) 5 MG tablet, Take 5 mg by mouth daily as needed. Patient states she takes whenever she needs it for cholesterol. Per patient, Disp: , Rfl:  .  warfarin (COUMADIN) 1 MG tablet, Take 1-1.5 mg by mouth daily. Monday and Friday take 1mg  rest of days take 1.5mg  tab per patient, Disp: , Rfl:

## 2015-08-19 NOTE — Assessment & Plan Note (Signed)
[name] has been struggling with multiple flare ups of her allergic rhinitis which require repeated doses of prednisone throughout the course of the year. Sadly, she has our he had side effects from this in terms of worsening glaucoma and visual changes. I also worry about her long-term risk of having osteopenia versus osteoporosis from repeated uses of prednisone. We know that she does well with Xolair.  Plan: We are applying for financial assistance for Xolair

## 2015-08-20 ENCOUNTER — Ambulatory Visit (INDEPENDENT_AMBULATORY_CARE_PROVIDER_SITE_OTHER): Payer: Medicare HMO | Admitting: *Deleted

## 2015-08-20 DIAGNOSIS — I480 Paroxysmal atrial fibrillation: Secondary | ICD-10-CM

## 2015-08-20 DIAGNOSIS — Z5181 Encounter for therapeutic drug level monitoring: Secondary | ICD-10-CM

## 2015-08-20 LAB — POCT INR: INR: 2.6

## 2015-08-21 NOTE — Telephone Encounter (Signed)
PA is still pending on patient-called to get updates today.

## 2015-08-21 NOTE — Telephone Encounter (Signed)
Has PA approval/denial been received? Thanks.

## 2015-08-22 ENCOUNTER — Telehealth: Payer: Self-pay | Admitting: Pulmonary Disease

## 2015-08-22 NOTE — Telephone Encounter (Signed)
   Type Contact Phone    08/22/2015 02:29 PM Phone (Incoming) Aleen Campi [phone]    Calling to give this info to [place] number is [id number] for Xolair from 08/21/2015 to 02/18/2016. Letter to be sent out in a few days.

## 2015-08-22 NOTE — Telephone Encounter (Signed)
Noted; please see piror phone note for further information.

## 2015-08-27 NOTE — Telephone Encounter (Signed)
ATC patient to inform her of PA approval; go back over [place] to local pharmacy and make sure patient understands how and when to use if needed,and give information of Specialty pharmacy that will be used. Pt did not answer and no voicemail options.

## 2015-08-27 NOTE — Telephone Encounter (Signed)
Katie please advise if anything further needs to be done with this message.  Thanks!

## 2015-09-02 ENCOUNTER — Telehealth: Payer: Self-pay | Admitting: Pulmonary Disease

## 2015-09-02 NOTE — Telephone Encounter (Signed)
Joellen Jersey will close encounter once she has spoken with patient.

## 2015-09-02 NOTE — Telephone Encounter (Addendum)
#   vials:6 Ordered date:09/02/15 Shipping Date:09/02/15 Cancel this one. Pharm. Didn't get pt.'s permission until today(09/03/15). Updated.

## 2015-09-03 NOTE — Telephone Encounter (Addendum)
Pt. Didn't call until today so her [name] went out today for del.09/04/15 # vials:6 Ordered date:09/03/15 Shipping Date:09/03/15

## 2015-09-04 MED ORDER — EPINEPHRINE 0.3 MG/0.3ML IJ SOAJ: 0.3000 mg | Freq: Once | INTRAMUSCULAR | Status: DC

## 2015-09-04 NOTE — Telephone Encounter (Signed)
#   Vials:6 Arrival Date: 09/04/15 Lot [id number] Exp Date:9/20

## 2015-09-04 NOTE — Telephone Encounter (Signed)
Spoke with patient-aware that PA for Xolair has been approved and Xolair medication is here in office-ready to start. Pt has been set up for Thursday 09-12-15 at 11:00am; aware to bring epipen and of the 2 hour wait time in office after getting Xolair injections.   Pharmacy: [facility].  Chart has been set up for allergy lab.

## 2015-09-12 ENCOUNTER — Ambulatory Visit: Payer: Medicare HMO

## 2015-09-17 ENCOUNTER — Emergency Department (HOSPITAL_COMMUNITY): Payer: Medicare HMO

## 2015-09-17 ENCOUNTER — Emergency Department (HOSPITAL_COMMUNITY)
Admission: EM | Admit: 2015-09-17 | Discharge: 2015-09-18 | Disposition: A | Discharge status: Home / Self Care | Payer: Medicare HMO | Attending: Emergency Medicine | Admitting: Emergency Medicine

## 2015-09-17 ENCOUNTER — Encounter (HOSPITAL_COMMUNITY): Payer: Self-pay

## 2015-09-17 DIAGNOSIS — Z8719 Personal history of other diseases of the digestive system: Secondary | ICD-10-CM | POA: Diagnosis not present

## 2015-09-17 DIAGNOSIS — H409 Unspecified glaucoma: Secondary | ICD-10-CM | POA: Diagnosis not present

## 2015-09-17 DIAGNOSIS — Z7901 Long term (current) use of anticoagulants: Secondary | ICD-10-CM | POA: Diagnosis not present

## 2015-09-17 DIAGNOSIS — J441 Chronic obstructive pulmonary disease with (acute) exacerbation: Secondary | ICD-10-CM

## 2015-09-17 DIAGNOSIS — E785 Hyperlipidemia, unspecified: Secondary | ICD-10-CM | POA: Insufficient documentation

## 2015-09-17 DIAGNOSIS — I2510 Atherosclerotic heart disease of native coronary artery without angina pectoris: Secondary | ICD-10-CM | POA: Diagnosis not present

## 2015-09-17 DIAGNOSIS — Z79899 Other long term (current) drug therapy: Secondary | ICD-10-CM | POA: Diagnosis not present

## 2015-09-17 DIAGNOSIS — Z880 Allergy status to penicillin: Secondary | ICD-10-CM | POA: Insufficient documentation

## 2015-09-17 DIAGNOSIS — Z85820 Personal history of malignant melanoma of skin: Secondary | ICD-10-CM | POA: Insufficient documentation

## 2015-09-17 DIAGNOSIS — I480 Paroxysmal atrial fibrillation: Secondary | ICD-10-CM | POA: Insufficient documentation

## 2015-09-17 DIAGNOSIS — J40 Bronchitis, not specified as acute or chronic: Secondary | ICD-10-CM

## 2015-09-17 DIAGNOSIS — Z87891 Personal history of nicotine dependence: Secondary | ICD-10-CM | POA: Insufficient documentation

## 2015-09-17 DIAGNOSIS — Z7951 Long term (current) use of inhaled steroids: Secondary | ICD-10-CM | POA: Insufficient documentation

## 2015-09-17 DIAGNOSIS — R0602 Shortness of breath: Secondary | ICD-10-CM | POA: Diagnosis present

## 2015-09-17 DIAGNOSIS — I10 Essential (primary) hypertension: Secondary | ICD-10-CM | POA: Insufficient documentation

## 2015-09-17 MED ORDER — HYDROCOD POLST-CPM POLST ER 10-8 MG/5ML PO SUER
2.5000 mL | Freq: Once | ORAL | Status: AC
  Administered 2015-09-18: 2.5 mL via ORAL
  Filled 2015-09-17: qty 5

## 2015-09-17 MED ORDER — PREDNISONE 20 MG PO TABS
20.0000 mg | ORAL_TABLET | Freq: Once | ORAL | Status: AC
  Administered 2015-09-18: 20 mg via ORAL
  Filled 2015-09-17: qty 1

## 2015-09-17 MED ORDER — PREDNISONE 50 MG PO TABS: ORAL_TABLET | ORAL | Status: DC

## 2015-09-17 MED ORDER — LEVOFLOXACIN 500 MG PO TABS: 500.0000 mg | ORAL_TABLET | Freq: Every day | ORAL | Status: DC

## 2015-09-17 MED ORDER — HYDROCOD POLST-CPM POLST ER 10-8 MG/5ML PO SUER: 2.5000 mL | Freq: Two times a day (BID) | ORAL | Status: DC | PRN

## 2015-09-17 MED ORDER — LEVOFLOXACIN 500 MG PO TABS
250.0000 mg | ORAL_TABLET | Freq: Once | ORAL | Status: AC
  Administered 2015-09-18: 250 mg via ORAL
  Filled 2015-09-17: qty 1

## 2015-09-17 NOTE — ED Notes (Signed)
Placed pt on 2L oxygen via nasal cannula due to O2 saturation 92% on room air; sats increased to 96% on 2L.

## 2015-09-17 NOTE — ED Provider Notes (Signed)
CSN: [id number]     Arrival date & time 09/17/15  1655 History   First MD Initiated Contact with Patient 09/17/15 1941     Chief Complaint  Patient presents with  . Weakness  . Diarrhea  . Shortness of Breath     (Consider location/radiation/quality/duration/timing/severity/associated sxs/prior Treatment) HPI.Marland KitchenMarland KitchenCoughing and sinus congestion for several days. Patient was seen by her primary care doctor on Thursday and given an intramuscular steroid shot. He felt better on Friday. On Saturday, she felt worse again and returned to her primary care doctor who gave her a second dose of IM steroids. He also prescribed erythromycin. Pt now complains of coughing, wheezing, sinus congestion. She has COPD. Severity symptoms is moderate. No fever, shaking chills, purulent sputum.  Past Medical History  Diagnosis Date  . Esophageal reflux   . Glaucoma   . Diverticulosis   . PAF (paroxysmal atrial fibrillation) (HCC)     a. intolerant to beta blockers due to asthma and intolerant to CCB due to rash;  b. CHA2DS2VASc = 6-->chronic coumadin;  c. 03/2014 Echo: EF 60-65%, Gr 1 DD, triv AI, mildly dil RV, PASP 36mmhg.  . HTN (hypertension), benign   . COPD/Asthma   . CAD (coronary artery disease) 2007    a. 1997 s/p PCI of diagonal.  . Hyperlipidemia   . TIA (transient ischemic attack)     a. 02/2011 - chronic coumadin in setting of PAF.  . Malignant melanoma ([place])     a. 09/2012 s/p resection.   Past Surgical History  Procedure Laterality Date  . Appendectomy    . Vesicovaginal fistula closure w/ tah    . Nasal sinus surgery    . Cholecystectomy    . Coronary angioplasty with stent placement    . Abdominal hysterectomy    . Eye surgery     Family History  Problem Relation Age of Onset  . Heart disease Father   . Hemachromatosis Father   . Heart attack Father   . Colon cancer Mother   . Peripheral vascular disease Mother   . Peripheral vascular disease Sister   . Cancer Sister   .  Lymphoma Sister   . Heart attack Brother   . Heart disease Brother    Social History  Substance Use Topics  . Smoking status: Former Smoker -- 0.20 packs/day for 25 years    Types: Cigarettes    Quit date: 07/06/1966  . Smokeless tobacco: None  . Alcohol Use: No   OB History    No data available     Review of Systems  All other systems reviewed and are negative.     Allergies  Alphagan; Biaxin; Brovana; Budesonide; Cefdinir; Cephalexin; Cifenline; Clonidine derivatives; Iohexol; Nsaids; Penicillins; Sulfamethoxazole; Sulfonamide derivatives; Tetracycline; Timolol; Vancomycin; Zetia; and Cardizem  Home Medications   Prior to Admission medications   Medication Sig Start Date End Date Taking? Authorizing Provider  acetaminophen (TYLENOL) 325 MG tablet Take 325 mg by mouth every 6 (six) hours as needed for moderate pain.    Yes Historical Provider, MD  ADVAIR DISKUS 250-50 MCG/DOSE AEPB INHALE ONE DOSE BY MOUTH TWICE DAILY 07/30/15  Yes Juanito Doom, MD  albuterol (PROVENTIL HFA;VENTOLIN HFA) 108 (90 BASE) MCG/ACT inhaler Inhale 1-2 puffs into the lungs every 4 (four) hours as needed for wheezing or shortness of breath. 11/01/14  Yes Kathee Delton, MD  ALPRAZolam Duanne Moron) 0.25 MG tablet Take 0.25 mg by mouth at bedtime as needed for anxiety.   Yes Historical  Provider, MD  amLODipine-valsartan (EXFORGE) 10-320 MG per tablet Take 1 tablet by mouth daily. 01/24/15  Yes Sueanne Margarita, MD  Cholecalciferol (VITAMIN D) 2000 units tablet Take 2,000 Units by mouth daily.   Yes Historical Provider, MD  digoxin (LANOXIN) 0.125 MG tablet Take 1 tablet (125 mcg total) by mouth daily. 01/24/15  Yes Sueanne Margarita, MD  fluticasone (FLONASE) 50 MCG/ACT nasal spray Place 1 spray into both nostrils daily as needed for allergies or rhinitis. 06/05/14  Yes Kathee Delton, MD  latanoprost (XALATAN) 0.005 % ophthalmic solution Place 1 drop into both eyes at bedtime.   Yes Historical Provider, MD   Multiple Vitamins-Minerals (MULTIVITAMIN & MINERAL PO) Take 1 tablet by mouth daily.   Yes Historical Provider, MD  pilocarpine (PILOCAR) 1 % ophthalmic solution Place 1 drop into the left eye 3 (three) times daily.  09/02/15  Yes Historical Provider, MD  simvastatin (ZOCOR) 5 MG tablet Take 5 mg by mouth daily as needed. Patient states she takes whenever she needs it for cholesterol. Per patient   Yes Historical Provider, MD  warfarin (COUMADIN) 2.5 MG tablet Take 2.5-3.75 mg by mouth daily. Take 3.75 mg (1.5 tablets) on Sun, Tues, Thurs, Sat & Take 2.5 mg (1 tablet) on Mon, Wed & Fri 07/18/15  Yes Historical Provider, MD  cetirizine (ZYRTEC) 10 MG tablet Take 10 mg by mouth daily as needed for allergies.    Historical Provider, MD  chlorpheniramine-HYDROcodone (TUSSIONEX PENNKINETIC ER) 10-8 MG/5ML SUER Take 2.5 mLs by mouth every 12 (twelve) hours as needed for cough. 09/17/15   Nat Christen, MD  EPINEPHrine 0.3 mg/0.3 mL IJ SOAJ injection Inject 0.3 mLs (0.3 mg total) into the muscle once. 09/04/15   Juanito Doom, MD  furosemide (LASIX) 40 MG tablet Take 1 tablet (40 mg total) by mouth daily as needed for fluid. 03/01/14   Sueanne Margarita, MD  levofloxacin (LEVAQUIN) 500 MG tablet Take 1 tablet (500 mg total) by mouth daily. 09/17/15   Nat Christen, MD  predniSONE (DELTASONE) 50 MG tablet One half tablet daily for 8 days 09/17/15   Nat Christen, MD   BP 140/64 mmHg  Pulse 84  Temp(Src) 97.7 F (36.5 C) (Oral)  Resp 20  Ht 5\' 7"  (1.702 m)  Wt 135 lb (61.236 kg)  BMI 21.14 kg/m2  SpO2 97% Physical Exam  Constitutional: She is oriented to person, place, and time. She appears well-developed and well-nourished.  HENT:  Head: Normocephalic and atraumatic.  Eyes: Conjunctivae and EOM are normal. Pupils are equal, round, and reactive to light.  Neck: Normal range of motion. Neck supple.  Cardiovascular: Normal rate and regular rhythm.   Pulmonary/Chest: Effort normal.  No obvious wheezing noted.   Abdominal: Soft. Bowel sounds are normal.  Musculoskeletal: Normal range of motion.  Neurological: She is alert and oriented to person, place, and time.  Skin: Skin is warm and dry.  Psychiatric: She has a normal mood and affect. Her behavior is normal.  Nursing note and vitals reviewed.   ED Course  Procedures (including critical care time) Labs Review Labs Reviewed - No data to display  Imaging Review Dg Chest 2 View  09/17/2015  CLINICAL DATA:  Productive cough, shortness of breath, history for fibrillation in COPD EXAM: CHEST  2 VIEW COMPARISON:  03/05/2015 FINDINGS: Background hyperinflation noted compatible with COPD/ emphysema. Minor streaky bibasilar atelectasis versus scarring. No focal pneumonia, collapse or consolidation. No edema, effusion or pneumothorax. Trachea midline. Aortic atherosclerosis evident.  Prior cholecystectomy noted. Degenerative changes of the spine. IMPRESSION: Stable COPD/emphysema.  No superimposed acute process Thoracic aortic atherosclerosis. Electronically Signed   By: Jerilynn Mages.  Shick M.D.   On: 09/17/2015 17:52   I have personally reviewed and evaluated these images and lab results as part of my medical decision-making.   EKG Interpretation None      MDM   Final diagnoses:  COPD exacerbation ([place])  Bronchitis    Patient is elderly and has COPD. Chest x-ray shows no obvious pneumonia. However, I will prescribe an antibiotic secondary to her age and immunocompromised state. Discharge medications Levaquin, prednisone, Tussionex. She has primary care follow-up.    Nat Christen, MD 09/17/15 [phone]

## 2015-09-17 NOTE — ED Notes (Signed)
Patient reports going to her PCP for sinuses x 2 and was given steroids.and zithromycin. Patient now c/o diarrhea, weakness,  A productive cough with gray sputum, and SOB. Patient has a history of atrial fib.

## 2015-09-17 NOTE — Discharge Instructions (Signed)
Prescription for antibiotic, cough syrup, prednisone. Rest. Increase fluids. Follow-up your primary care doctor.

## 2015-09-18 ENCOUNTER — Encounter: Payer: Self-pay | Admitting: Cardiology

## 2015-09-18 NOTE — Progress Notes (Signed)
Cardiology Office Note   Date:  09/19/2015   ID:  ZABELLE LOISEL, DOB 08-09-1923, MRN [id number]  PCP:  Lynne Logan, MD    Chief Complaint  Patient presents with  . Coronary Artery Disease    no sx  . Hypertension  . Atrial Fibrillation      History of Present Illness: [name] is a 82 y.o. female with a history of ASCAD/PAF/HTN/dyslipidemia who presents today for followup. Lexiscan myoview 02/2015 showed no ischemia and normal LVF. 2D echo was normal. She is doing well but was seen in the ER 2 days ago for increased SOB and was diagnosed with asthma exacerbation with acute bronchitis.  She is feeling much better today. . She denies any anginal chest pain. She denies any dizziness, palpitations or syncope. She has chronic SOB due to asthma which is stable. She has chronic LE edema which is stable.     Past Medical History  Diagnosis Date  . Esophageal reflux   . Glaucoma   . Diverticulosis   . PAF (paroxysmal atrial fibrillation) (HCC)     a. intolerant to beta blockers due to asthma and intolerant to CCB due to rash;  b. CHA2DS2VASc = 6-->chronic coumadin;  c. 03/2014 Echo: EF 60-65%, Gr 1 DD, triv AI, mildly dil RV, PASP 15mmhg.  . HTN (hypertension), benign   . COPD/Asthma   . CAD (coronary artery disease) 2007    a. 1997 s/p PCI of diagonal.  . Hyperlipidemia   . TIA (transient ischemic attack)     a. 02/2011 - chronic coumadin in setting of PAF.  . Malignant melanoma ([facility])     a. 09/2012 s/p resection.    Past Surgical History  Procedure Laterality Date  . Appendectomy    . Vesicovaginal fistula closure w/ tah    . Nasal sinus surgery    . Cholecystectomy    . Coronary angioplasty with stent placement    . Abdominal hysterectomy    . Eye surgery       Current Outpatient Prescriptions  Medication Sig Dispense Refill  . acetaminophen (TYLENOL) 325 MG tablet Take 325 mg by mouth every 6 (six) hours as needed for  moderate pain.     [name] ADVAIR DISKUS 250-50 MCG/DOSE AEPB INHALE ONE DOSE BY MOUTH TWICE DAILY 60 each 5  . albuterol (PROVENTIL HFA;VENTOLIN HFA) 108 (90 BASE) MCG/ACT inhaler Inhale 1-2 puffs into the lungs every 4 (four) hours as needed for wheezing or shortness of breath. 1 Inhaler 3  . ALPRAZolam (XANAX) 0.25 MG tablet Take 0.25 mg by mouth at bedtime as needed for anxiety.    [name] amLODipine-valsartan (EXFORGE) 10-320 MG per tablet Take 1 tablet by mouth daily. 90 tablet 3  . cetirizine (ZYRTEC) 10 MG tablet Take 10 mg by mouth daily as needed for allergies.    . chlorpheniramine-HYDROcodone (TUSSIONEX PENNKINETIC ER) 10-8 MG/5ML SUER Take 2.5 mLs by mouth every 12 (twelve) hours as needed for cough. 100 mL 0  . Cholecalciferol (VITAMIN D) 2000 units tablet Take 2,000 Units by mouth daily.    . digoxin (LANOXIN) 0.125 MG tablet Take 1 tablet (125 mcg total) by mouth daily. 90 tablet 3  . EPINEPHrine 0.3 mg/0.3 mL IJ SOAJ injection Inject 0.3 mLs (0.3 mg total) into the muscle once. 1 Device 11  . fluticasone (FLONASE) 50 MCG/ACT nasal spray Place 1 spray into both nostrils daily  as needed for allergies or rhinitis. 16 g 6  . furosemide (LASIX) 40 MG tablet Take 1 tablet (40 mg total) by mouth daily as needed for fluid. 30 tablet 1  . latanoprost (XALATAN) 0.005 % ophthalmic solution Place 1 drop into both eyes at bedtime.    [name] levofloxacin (LEVAQUIN) 500 MG tablet Take 1 tablet (500 mg total) by mouth daily. 10 tablet 0  . Multiple Vitamins-Minerals (MULTIVITAMIN & MINERAL PO) Take 1 tablet by mouth daily.    . pilocarpine (PILOCAR) 1 % ophthalmic solution Place 1 drop into the left eye 3 (three) times daily.     . predniSONE (DELTASONE) 50 MG tablet One half tablet daily for 8 days 4 tablet 0  . simvastatin (ZOCOR) 5 MG tablet Take 5 mg by mouth daily as needed (Patient states she takes whenever she needs it for cholesterol.).     [name] warfarin (COUMADIN) 2.5 MG tablet Take 2.5-3.75 mg by mouth  daily. Take 3.75 mg (1.5 tablets) on Sun, Tues, Thurs, Sat & Take 2.5 mg (1 tablet) on Mon, Wed & Fri     No current facility-administered medications for this visit.    Allergies:   Alphagan; Biaxin; Brovana; Budesonide; Cefdinir; Cephalexin; Cifenline; Clonidine derivatives; Iohexol; Nsaids; Penicillins; Sulfamethoxazole; Sulfonamide derivatives; Tetracycline; Timolol; Vancomycin; Zetia; and Cardizem    Social History:  The patient  reports that she quit smoking about 49 years ago. Her smoking use included Cigarettes. She has a 5 pack-year smoking history. She does not have any smokeless tobacco history on file. She reports that she does not drink alcohol or use illicit drugs.   Family History:  The patient's family history includes Cancer in her sister; Colon cancer in her mother; Heart attack in her brother and father; Heart disease in her brother and father; Hemachromatosis in her father; Lymphoma in her sister; Peripheral vascular disease in her mother and sister.    ROS:  Please see the history of present illness.   Otherwise, review of systems are positive for none.   All other systems are reviewed and negative.    PHYSICAL EXAM: VS:  BP 140/60 mmHg  Pulse 85  Ht 5\' 6"  (1.676 m)  Wt 132 lb (59.875 kg)  BMI 21.32 kg/m2  SpO2 95% , BMI Body mass index is 21.32 kg/(m^2). GEN: Well nourished, well developed, in no acute distress HEENT: normal Neck: no JVD, carotid bruits, or masses Cardiac: RRR; no murmurs, rubs, or gallops,no edema  Respiratory:  clear to auscultation bilaterally, normal work of breathing GI: soft, nontender, nondistended, + BS MS: no deformity or atrophy Skin: warm and dry, no rash Neuro:  Strength and sensation are intact Psych: euthymic mood, full affect   EKG:  EKG was ordered today with PAC's and PVC's and nonspecific ST abnormality in lateral precordial leads.    Recent Labs: 03/05/2015: BUN 21*; Creatinine, Ser 1.03*; Potassium 4.4; Sodium  136 03/22/2015: ALT 11; Hemoglobin 12.3; Platelets 205.0    Lipid Panel    Component Value Date/Time   CHOL 142 03/22/2015 0908   TRIG 148.0 03/22/2015 0908   HDL 55.50 03/22/2015 0908   CHOLHDL 3 03/22/2015 0908   VLDL 29.6 03/22/2015 0908   LDLCALC 57 03/22/2015 0908      Wt Readings from Last 3 Encounters:  09/19/15 132 lb (59.875 kg)  09/17/15 135 lb (61.236 kg)  08/19/15 136 lb 12.8 oz (62.052 kg)     ASSESSMENT AND PLAN:  1. ASCAD with no angina. Nuclear stress test  2016 with no ischemia and 2D echo was normal. She is not on ASA due to warfarin 2. HTN - controlled - continue amlodipine/Valsartan  - check BMET 3. Dyslipidemia - cannot tolerate higher dose of statin - continue simvastatin 5mg  daily - check FLP and ALT 4. PAF maintaining NSR with PACs and PVCs - continue Warfarin  - stop digoxin since she has not had any further PAF.      Current medicines are reviewed at length with the patient today.  The patient does not have concerns regarding medicines.  The following changes have been made:  no change  Labs/ tests ordered today: See above Assessment and Plan No orders of the defined types were placed in this encounter.     Disposition:   FU with me in 6 months  Signed, Sueanne Margarita, MD  09/19/2015 11:00 AM    [facility] [place], [place], [place]  [address] Phone: [phone]; Fax: [phone]

## 2015-09-19 ENCOUNTER — Encounter: Payer: Self-pay | Admitting: Cardiology

## 2015-09-19 ENCOUNTER — Ambulatory Visit (INDEPENDENT_AMBULATORY_CARE_PROVIDER_SITE_OTHER): Payer: Medicare HMO | Admitting: Cardiology

## 2015-09-19 ENCOUNTER — Ambulatory Visit (INDEPENDENT_AMBULATORY_CARE_PROVIDER_SITE_OTHER): Payer: Medicare HMO | Admitting: *Deleted

## 2015-09-19 VITALS — BP 140/60 | HR 85 | Ht 66.0 in | Wt 132.0 lb

## 2015-09-19 DIAGNOSIS — I10 Essential (primary) hypertension: Secondary | ICD-10-CM | POA: Diagnosis not present

## 2015-09-19 DIAGNOSIS — I480 Paroxysmal atrial fibrillation: Secondary | ICD-10-CM | POA: Diagnosis not present

## 2015-09-19 DIAGNOSIS — I2510 Atherosclerotic heart disease of native coronary artery without angina pectoris: Secondary | ICD-10-CM

## 2015-09-19 DIAGNOSIS — Z5181 Encounter for therapeutic drug level monitoring: Secondary | ICD-10-CM

## 2015-09-19 DIAGNOSIS — I2583 Coronary atherosclerosis due to lipid rich plaque: Secondary | ICD-10-CM

## 2015-09-19 DIAGNOSIS — E785 Hyperlipidemia, unspecified: Secondary | ICD-10-CM

## 2015-09-19 LAB — POCT INR: INR: 3.9

## 2015-09-19 NOTE — Patient Instructions (Signed)
Medication Instructions:  Your physician has recommended you make the following change in your medication:  1) STOP DIGOXIN  Labwork: Your physician recommends that you return for FASTING lab work.  Testing/Procedures: None  Follow-Up: Your physician wants you to follow-up in: 6 months with Dr. Radford Pax. You will receive a reminder letter in the mail two months in advance. If you don't receive a letter, please call our office to schedule the follow-up appointment.   Any Other Special Instructions Will Be Listed Below (If Applicable).     If you need a refill on your cardiac medications before your next appointment, please call your pharmacy.

## 2015-09-20 ENCOUNTER — Ambulatory Visit (INDEPENDENT_AMBULATORY_CARE_PROVIDER_SITE_OTHER): Payer: Medicare HMO

## 2015-09-20 DIAGNOSIS — J4540 Moderate persistent asthma, uncomplicated: Secondary | ICD-10-CM

## 2015-09-23 MED ORDER — OMALIZUMAB 150 MG ~~LOC~~ SOLR
375.0000 mg | Freq: Once | SUBCUTANEOUS | Status: AC
  Administered 2015-09-23: 375 mg via SUBCUTANEOUS

## 2015-09-24 ENCOUNTER — Other Ambulatory Visit (INDEPENDENT_AMBULATORY_CARE_PROVIDER_SITE_OTHER): Payer: Medicare HMO | Admitting: *Deleted

## 2015-09-24 ENCOUNTER — Other Ambulatory Visit: Payer: Self-pay | Admitting: Cardiology

## 2015-09-24 ENCOUNTER — Ambulatory Visit (INDEPENDENT_AMBULATORY_CARE_PROVIDER_SITE_OTHER): Payer: Medicare HMO | Admitting: Pharmacist

## 2015-09-24 DIAGNOSIS — I480 Paroxysmal atrial fibrillation: Secondary | ICD-10-CM | POA: Diagnosis not present

## 2015-09-24 DIAGNOSIS — E785 Hyperlipidemia, unspecified: Secondary | ICD-10-CM

## 2015-09-24 DIAGNOSIS — Z5181 Encounter for therapeutic drug level monitoring: Secondary | ICD-10-CM

## 2015-09-24 LAB — BASIC METABOLIC PANEL
BUN: 20 mg/dL (ref 7–25)
CO2: 30 mmol/L (ref 20–31)
Calcium: 9 mg/dL (ref 8.6–10.4)
Chloride: 101 mmol/L (ref 98–110)
Creat: 0.93 mg/dL — ABNORMAL HIGH (ref 0.60–0.88)
GLUCOSE: 85 mg/dL (ref 65–99)
POTASSIUM: 4.3 mmol/L (ref 3.5–5.3)
SODIUM: 140 mmol/L (ref 135–146)

## 2015-09-24 LAB — LIPID PANEL
CHOL/HDL RATIO: 2.3 ratio (ref <=5.0)
Cholesterol: 193 mg/dL (ref 125–200)
HDL: 83 mg/dL (ref >=46)
LDL CALC: 93 mg/dL (ref <130)
Triglycerides: 85 mg/dL (ref <150)
VLDL: 17 mg/dL (ref <30)

## 2015-09-24 LAB — HEPATIC FUNCTION PANEL
ALK PHOS: 57 U/L (ref 33–130)
ALT: 13 U/L (ref 6–29)
AST: 17 U/L (ref 10–35)
Albumin: 3.7 g/dL (ref 3.6–5.1)
BILIRUBIN DIRECT: 0.2 mg/dL (ref <=0.2)
BILIRUBIN INDIRECT: 0.5 mg/dL (ref 0.2–1.2)
BILIRUBIN TOTAL: 0.7 mg/dL (ref 0.2–1.2)
Total Protein: 6.7 g/dL (ref 6.1–8.1)

## 2015-09-24 LAB — POCT INR: INR: 2.2

## 2015-09-26 ENCOUNTER — Telehealth: Payer: Self-pay | Admitting: Pulmonary Disease

## 2015-09-26 NOTE — Telephone Encounter (Signed)
[employer] had to have called to sch. Del. Pt. Has a dose on hand. Pt. Just got her 1st shot 09/20/15. Nothing further needed.

## 2015-09-26 NOTE — Telephone Encounter (Signed)
#   Vials:6 Arrival Date:09/26/15 Lot [id number] Exp Date:11/20

## 2015-09-28 ENCOUNTER — Other Ambulatory Visit: Payer: Self-pay | Admitting: Cardiology

## 2015-09-30 ENCOUNTER — Other Ambulatory Visit: Payer: Medicare HMO

## 2015-10-01 ENCOUNTER — Telehealth: Payer: Self-pay | Admitting: *Deleted

## 2015-10-01 DIAGNOSIS — E785 Hyperlipidemia, unspecified: Secondary | ICD-10-CM

## 2015-10-01 MED ORDER — PRAVASTATIN SODIUM 20 MG PO TABS: 20.0000 mg | ORAL_TABLET | Freq: Every evening | ORAL | Status: DC

## 2015-10-01 NOTE — Telephone Encounter (Signed)
-----   Message from Sueanne Margarita, MD sent at 09/29/2015  3:59 PM EDT ----- Start Pravastatin 20mg  daily and recheck FLP and ALT in 6 weeks

## 2015-10-01 NOTE — Telephone Encounter (Signed)
Patient informed and verbalizes understanding and agreement. Will send to her [facility]. She would like wait to schedule f/u labs as she may be able to coordinate with her [facility] appointment.

## 2015-10-04 ENCOUNTER — Ambulatory Visit: Payer: Medicare HMO | Admitting: *Deleted

## 2015-10-04 ENCOUNTER — Ambulatory Visit (INDEPENDENT_AMBULATORY_CARE_PROVIDER_SITE_OTHER): Payer: Medicare HMO

## 2015-10-04 DIAGNOSIS — J4540 Moderate persistent asthma, uncomplicated: Secondary | ICD-10-CM | POA: Diagnosis not present

## 2015-10-04 MED ORDER — OMALIZUMAB 150 MG ~~LOC~~ SOLR
375.0000 mg | Freq: Once | SUBCUTANEOUS | Status: AC
  Administered 2015-10-04: 375 mg via SUBCUTANEOUS

## 2015-10-09 ENCOUNTER — Telehealth: Payer: Self-pay

## 2015-10-09 NOTE — Telephone Encounter (Signed)
[name] with [facility], [phone] calling stating this is being rejected as it is to soon to fill.  She states they may put order in on 04/11 to be delivered on 04/12 if this is okay.

## 2015-10-09 NOTE — Telephone Encounter (Signed)
Will send to [facility] as [name]  Xolair shipment can not be processed until 4/11 due to insurance coverage.  Called and verified above information with [employer].

## 2015-10-09 NOTE — Telephone Encounter (Signed)
#   vials: 6 Ordered date: 10/09/15 Shipping Date: 10/15/15

## 2015-10-15 ENCOUNTER — Ambulatory Visit (INDEPENDENT_AMBULATORY_CARE_PROVIDER_SITE_OTHER): Payer: Medicare HMO | Admitting: *Deleted

## 2015-10-15 DIAGNOSIS — I480 Paroxysmal atrial fibrillation: Secondary | ICD-10-CM

## 2015-10-15 DIAGNOSIS — Z5181 Encounter for therapeutic drug level monitoring: Secondary | ICD-10-CM | POA: Diagnosis not present

## 2015-10-15 LAB — POCT INR: INR: 3.7

## 2015-10-17 ENCOUNTER — Ambulatory Visit (INDEPENDENT_AMBULATORY_CARE_PROVIDER_SITE_OTHER): Payer: Medicare HMO

## 2015-10-17 DIAGNOSIS — J4540 Moderate persistent asthma, uncomplicated: Secondary | ICD-10-CM

## 2015-10-17 MED ORDER — OMALIZUMAB 150 MG ~~LOC~~ SOLR
375.0000 mg | Freq: Once | SUBCUTANEOUS | Status: AC
  Administered 2015-10-17: 375 mg via SUBCUTANEOUS

## 2015-10-30 ENCOUNTER — Ambulatory Visit (INDEPENDENT_AMBULATORY_CARE_PROVIDER_SITE_OTHER): Payer: Medicare HMO | Admitting: *Deleted

## 2015-10-30 DIAGNOSIS — I480 Paroxysmal atrial fibrillation: Secondary | ICD-10-CM

## 2015-10-30 DIAGNOSIS — Z5181 Encounter for therapeutic drug level monitoring: Secondary | ICD-10-CM | POA: Diagnosis not present

## 2015-10-30 LAB — POCT INR: INR: 2.3

## 2015-10-31 ENCOUNTER — Ambulatory Visit (INDEPENDENT_AMBULATORY_CARE_PROVIDER_SITE_OTHER): Payer: Medicare HMO

## 2015-10-31 DIAGNOSIS — J4540 Moderate persistent asthma, uncomplicated: Secondary | ICD-10-CM | POA: Diagnosis not present

## 2015-10-31 MED ORDER — OMALIZUMAB 150 MG ~~LOC~~ SOLR
375.0000 mg | Freq: Once | SUBCUTANEOUS | Status: AC
  Administered 2015-10-31: 375 mg via SUBCUTANEOUS

## 2015-11-04 ENCOUNTER — Other Ambulatory Visit: Payer: Self-pay | Admitting: Cardiology

## 2015-11-04 ENCOUNTER — Telehealth: Payer: Self-pay | Admitting: Internal Medicine

## 2015-11-04 NOTE — Telephone Encounter (Signed)
#   vials: 6 Ordered date: 11/04/2015 Shipping Date: 11/06/2015

## 2015-11-06 NOTE — Telephone Encounter (Signed)
#   Vials:6 Arrival Date:11/06/2015  Lot [id number] Exp Date:05/2019

## 2015-11-14 ENCOUNTER — Ambulatory Visit (INDEPENDENT_AMBULATORY_CARE_PROVIDER_SITE_OTHER): Payer: Medicare HMO

## 2015-11-14 ENCOUNTER — Ambulatory Visit: Payer: Medicare HMO

## 2015-11-14 DIAGNOSIS — J4540 Moderate persistent asthma, uncomplicated: Secondary | ICD-10-CM | POA: Diagnosis not present

## 2015-11-14 MED ORDER — OMALIZUMAB 150 MG ~~LOC~~ SOLR
375.0000 mg | Freq: Once | SUBCUTANEOUS | Status: AC
  Administered 2015-11-14: 375 mg via SUBCUTANEOUS

## 2015-11-20 ENCOUNTER — Ambulatory Visit (INDEPENDENT_AMBULATORY_CARE_PROVIDER_SITE_OTHER): Payer: Medicare HMO | Admitting: *Deleted

## 2015-11-20 ENCOUNTER — Other Ambulatory Visit (INDEPENDENT_AMBULATORY_CARE_PROVIDER_SITE_OTHER): Payer: Medicare HMO | Admitting: *Deleted

## 2015-11-20 DIAGNOSIS — E785 Hyperlipidemia, unspecified: Secondary | ICD-10-CM

## 2015-11-20 DIAGNOSIS — I480 Paroxysmal atrial fibrillation: Secondary | ICD-10-CM

## 2015-11-20 DIAGNOSIS — Z5181 Encounter for therapeutic drug level monitoring: Secondary | ICD-10-CM | POA: Diagnosis not present

## 2015-11-20 LAB — ALT: ALT: 8 U/L (ref 6–29)

## 2015-11-20 LAB — LIPID PANEL
CHOL/HDL RATIO: 1.8 ratio (ref <=5.0)
Cholesterol: 134 mg/dL (ref 125–200)
HDL: 76 mg/dL (ref >=46)
LDL CALC: 45 mg/dL (ref <130)
TRIGLYCERIDES: 64 mg/dL (ref <150)
VLDL: 13 mg/dL (ref <30)

## 2015-11-20 LAB — POCT INR: INR: 1.9

## 2015-11-28 ENCOUNTER — Ambulatory Visit (INDEPENDENT_AMBULATORY_CARE_PROVIDER_SITE_OTHER): Payer: Medicare HMO

## 2015-11-28 ENCOUNTER — Ambulatory Visit (INDEPENDENT_AMBULATORY_CARE_PROVIDER_SITE_OTHER): Payer: Medicare HMO | Admitting: Pulmonary Disease

## 2015-11-28 ENCOUNTER — Encounter: Payer: Self-pay | Admitting: Pulmonary Disease

## 2015-11-28 VITALS — BP 130/66 | HR 70 | Temp 98.1°F | Ht 66.0 in | Wt 132.0 lb

## 2015-11-28 DIAGNOSIS — J4540 Moderate persistent asthma, uncomplicated: Secondary | ICD-10-CM

## 2015-11-28 DIAGNOSIS — J449 Chronic obstructive pulmonary disease, unspecified: Secondary | ICD-10-CM

## 2015-11-28 DIAGNOSIS — J45909 Unspecified asthma, uncomplicated: Secondary | ICD-10-CM | POA: Diagnosis not present

## 2015-11-28 DIAGNOSIS — J301 Allergic rhinitis due to pollen: Secondary | ICD-10-CM

## 2015-11-28 MED ORDER — PREDNISONE 10 MG PO TABS: 20.0000 mg | ORAL_TABLET | Freq: Every day | ORAL | Status: DC

## 2015-11-28 MED ORDER — FLUTICASONE-SALMETEROL 113-14 MCG/ACT IN AEPB: 1.0000 | INHALATION_SPRAY | Freq: Two times a day (BID) | RESPIRATORY_TRACT | Status: DC

## 2015-11-28 NOTE — Assessment & Plan Note (Signed)
This has flared up today but has been stable recently since adding Xolair.  Plan: Take Zyrtec today Continue using Flonase regularly Continue Xolair

## 2015-11-28 NOTE — Assessment & Plan Note (Signed)
Her asthma has been stable recently with the exception of a mild flare of her allergic rhinitis. She is tolerating Xolair well. She will continue taking this dose.  Plan: Continue Xolair Change Advair to generic form Follow-up 6 months

## 2015-11-28 NOTE — Patient Instructions (Signed)
Change to [place] as you are doing If your symptoms do not improve in the next few days then take the prednisone I prescribed We will see you back in 6 months or sooner if needed

## 2015-11-28 NOTE — Progress Notes (Signed)
Subjective:    Patient ID: [name], female    DOB: February 14, 1924, 82 y.o.   MRN: [id number]  Synopsis: Former patient of Dr. Gwenette Greet with asthma and COPD.  She started smoking as a child and smoked until age 89, but she smoked only about 1 cigarette per week.  She started having lung problems in 2000. PFT's 2009:  FEV1 1.23 (64%), ratio 57, DLCO 67%. No change with singulair or higher dose advair [place] 2006:  IgE 855, multiple significant allergens. On Xolair with good clinical response >> d/ced 08/2012 due to expense.  Cannot tolerate nebulized bronchodilators for maintenance +nasal polyps per ENT evaluation >> [facility].  Recommended surgery but pt declined. On topical nasal steroids.   Elevated Eos on CBC 08/2014  HPI Chief Complaint  Patient presents with  . Follow-up    pt c/o PND, runny nose, prod cough with clear mucus X5 days.     [name] has been having more trouble with her sinuses lately.  She is having a lot of clear sinus drainage lately.  She had been doing OK prior to this episodes that started yesterday.  She denies chest tightness, but has a rare wheeze (this is not frequent). She denies fever, chills or body aches. She is taking flonase and saline rinses.  She takes zyrtec as needed.  She started Xolair about 2 months ago.   Past Medical History  Diagnosis Date  . Esophageal reflux   . Glaucoma   . Diverticulosis   . PAF (paroxysmal atrial fibrillation) (HCC)     a. intolerant to beta blockers due to asthma and intolerant to CCB due to rash;  b. CHA2DS2VASc = 6-->chronic coumadin;  c. 03/2014 Echo: EF 60-65%, Gr 1 DD, triv AI, mildly dil RV, PASP 26mmhg.  . HTN (hypertension), benign   . COPD/Asthma   . CAD (coronary artery disease) 2007    a. 1997 s/p PCI of diagonal.  . Hyperlipidemia   . TIA (transient ischemic attack)     a. 02/2011 - chronic coumadin in setting of PAF.  . Malignant melanoma ([place])     a. 09/2012 s/p resection.      Review of  Systems  Constitutional: Negative for fever, chills and fatigue.  HENT: Positive for postnasal drip, rhinorrhea and sinus pressure.   Respiratory: Negative for cough, shortness of breath and wheezing.   Cardiovascular: Negative for chest pain, palpitations and leg swelling.       Objective:   Physical Exam Filed Vitals:   11/28/15 1129  BP: 130/66  Pulse: 70  Temp: 98.1 F (36.7 C)  TempSrc: Oral  Height: 5\' 6"  (1.676 m)  Weight: 132 lb (59.875 kg)  SpO2: 94%   RA  Gen: well appearing HENT: OP clear, TM's clear, neck supple PULM: CTA B, normal percussion CV: RRR, no mgr, trace edema GI: BS+, soft, nontender Derm: no cyanosis or rash Psyche: normal mood and affect        Assessment & Plan:  Chronic obstructive airway disease with asthma (HCC) Her asthma has been stable recently with the exception of a mild flare of her allergic rhinitis. She is tolerating Xolair well. She will continue taking this dose.  Plan: Continue Xolair Change Advair to generic form Follow-up 6 months  Allergic rhinitis due to pollen This has flared up today but has been stable recently since adding Xolair.  Plan: Take Zyrtec today Continue using Flonase regularly Continue Xolair     Current outpatient prescriptions:  .  acetaminophen (TYLENOL) 325 MG tablet, Take 325 mg by mouth every 6 (six) hours as needed for moderate pain. , Disp: , Rfl:  .  albuterol (PROVENTIL HFA;VENTOLIN HFA) 108 (90 BASE) MCG/ACT inhaler, Inhale 1-2 puffs into the lungs every 4 (four) hours as needed for wheezing or shortness of breath., Disp: 1 Inhaler, Rfl: 3 .  ALPRAZolam (XANAX) 0.25 MG tablet, Take 0.25 mg by mouth at bedtime as needed for anxiety., Disp: , Rfl:  .  amLODipine-valsartan (EXFORGE) 10-320 MG per tablet, Take 1 tablet by mouth daily., Disp: 90 tablet, Rfl: 3 .  cetirizine (ZYRTEC) 10 MG tablet, Take 10 mg by mouth daily as needed for allergies., Disp: , Rfl:  .  Cholecalciferol (VITAMIN D)  2000 units tablet, Take 2,000 Units by mouth daily., Disp: , Rfl:  .  EPINEPHrine 0.3 mg/0.3 mL IJ SOAJ injection, Inject 0.3 mLs (0.3 mg total) into the muscle once., Disp: 1 Device, Rfl: 11 .  fluticasone (FLONASE) 50 MCG/ACT nasal spray, Place 1 spray into both nostrils daily as needed for allergies or rhinitis., Disp: 16 g, Rfl: 6 .  furosemide (LASIX) 40 MG tablet, Take 1 tablet (40 mg total) by mouth daily as needed for fluid., Disp: 30 tablet, Rfl: 10 .  latanoprost (XALATAN) 0.005 % ophthalmic solution, Place 1 drop into both eyes at bedtime., Disp: , Rfl:  .  Multiple Vitamins-Minerals (MULTIVITAMIN & MINERAL PO), Take 1 tablet by mouth daily., Disp: , Rfl:  .  pilocarpine (PILOCAR) 1 % ophthalmic solution, Place 1 drop into the left eye 3 (three) times daily. , Disp: , Rfl:  .  pravastatin (PRAVACHOL) 20 MG tablet, Take 1 tablet (20 mg total) by mouth every evening., Disp: 30 tablet, Rfl: 11 .  simvastatin (ZOCOR) 5 MG tablet, Take 5 mg by mouth daily as needed (Patient states she takes whenever she needs it for cholesterol.). , Disp: , Rfl:  .  warfarin (COUMADIN) 2.5 MG tablet, Take 2.5-3.75 mg by mouth daily. Take 3.75 mg (1.5 tablets) on Sun, Tues, Thurs, Sat & Take 2.5 mg (1 tablet) on Mon, Wed & Fri, Disp: , Rfl:  .  warfarin (COUMADIN) 2.5 MG tablet, TAKE ONE TABLET BY MOUTH ONCE DAILY, Disp: 125 tablet, Rfl: 0 .  Fluticasone-Salmeterol (AIRDUO RESPICLICK [id number]) 0000000 MCG/ACT AEPB, Inhale 1 puff into the lungs 2 (two) times daily., Disp: 1 each, Rfl: 5 .  predniSONE (DELTASONE) 10 MG tablet, Take 2 tablets (20 mg total) by mouth daily with breakfast., Disp: 10 tablet, Rfl: 0

## 2015-11-29 MED ORDER — OMALIZUMAB 150 MG ~~LOC~~ SOLR
375.0000 mg | Freq: Once | SUBCUTANEOUS | Status: AC
  Administered 2015-11-28: 375 mg via SUBCUTANEOUS

## 2015-12-05 ENCOUNTER — Telehealth: Payer: Self-pay | Admitting: Pulmonary Disease

## 2015-12-05 NOTE — Telephone Encounter (Signed)
#   vials:6 Ordered date:12/05/15 Shipping Date:12/09/15

## 2015-12-10 NOTE — Telephone Encounter (Signed)
#   Vials:6 Arrival Date:12/10/15 Lot [id number] Exp Date:06/2019

## 2015-12-11 ENCOUNTER — Other Ambulatory Visit: Payer: Self-pay | Admitting: *Deleted

## 2015-12-11 ENCOUNTER — Ambulatory Visit (INDEPENDENT_AMBULATORY_CARE_PROVIDER_SITE_OTHER): Payer: Medicare HMO | Admitting: *Deleted

## 2015-12-11 DIAGNOSIS — Z5181 Encounter for therapeutic drug level monitoring: Secondary | ICD-10-CM | POA: Diagnosis not present

## 2015-12-11 DIAGNOSIS — I480 Paroxysmal atrial fibrillation: Secondary | ICD-10-CM

## 2015-12-11 LAB — POCT INR: INR: 1.9

## 2015-12-11 MED ORDER — ALBUTEROL SULFATE HFA 108 (90 BASE) MCG/ACT IN AERS: 1.0000 | INHALATION_SPRAY | RESPIRATORY_TRACT | Status: AC | PRN

## 2015-12-12 ENCOUNTER — Ambulatory Visit (INDEPENDENT_AMBULATORY_CARE_PROVIDER_SITE_OTHER): Payer: Medicare HMO

## 2015-12-12 DIAGNOSIS — J4540 Moderate persistent asthma, uncomplicated: Secondary | ICD-10-CM

## 2015-12-12 MED ORDER — OMALIZUMAB 150 MG ~~LOC~~ SOLR
375.0000 mg | Freq: Once | SUBCUTANEOUS | Status: AC
  Administered 2015-12-12: 375 mg via SUBCUTANEOUS

## 2015-12-23 ENCOUNTER — Telehealth: Payer: Self-pay | Admitting: *Deleted

## 2015-12-23 NOTE — Telephone Encounter (Signed)
Initiated PA for AirDuo thru [facility]. Key: [id number] Submitted for review.  Pt ID: [id number]   [facility] [place], [place] - [id number] [phone] (Phone) [phone] (Fax)

## 2015-12-25 ENCOUNTER — Ambulatory Visit (INDEPENDENT_AMBULATORY_CARE_PROVIDER_SITE_OTHER): Payer: Medicare HMO | Admitting: Pharmacist

## 2015-12-25 DIAGNOSIS — I480 Paroxysmal atrial fibrillation: Secondary | ICD-10-CM | POA: Diagnosis not present

## 2015-12-25 DIAGNOSIS — Z5181 Encounter for therapeutic drug level monitoring: Secondary | ICD-10-CM | POA: Diagnosis not present

## 2015-12-25 LAB — POCT INR: INR: 2.3

## 2015-12-26 ENCOUNTER — Ambulatory Visit (INDEPENDENT_AMBULATORY_CARE_PROVIDER_SITE_OTHER): Payer: Medicare HMO

## 2015-12-26 ENCOUNTER — Ambulatory Visit: Payer: Medicare HMO

## 2015-12-26 DIAGNOSIS — J4540 Moderate persistent asthma, uncomplicated: Secondary | ICD-10-CM | POA: Diagnosis not present

## 2015-12-27 MED ORDER — OMALIZUMAB 150 MG ~~LOC~~ SOLR
375.0000 mg | Freq: Once | SUBCUTANEOUS | Status: AC
  Administered 2015-12-26: 375 mg via SUBCUTANEOUS

## 2015-12-30 ENCOUNTER — Telehealth: Payer: Self-pay | Admitting: Pulmonary Disease

## 2015-12-30 NOTE — Telephone Encounter (Signed)
#   vials:6 Ordered date:12/30/15 Shipping Date:12/31/15

## 2015-12-30 NOTE — Telephone Encounter (Signed)
Airduo has been approved until 07/05/2016. Pharmacy informed. Nothing further needed.

## 2016-01-01 NOTE — Telephone Encounter (Signed)
#   Vials:6 Arrival Date:01/01/16 Lot [id number] Exp Date:12/20

## 2016-01-09 ENCOUNTER — Ambulatory Visit (INDEPENDENT_AMBULATORY_CARE_PROVIDER_SITE_OTHER): Payer: Medicare HMO

## 2016-01-09 ENCOUNTER — Ambulatory Visit: Payer: Medicare HMO

## 2016-01-09 DIAGNOSIS — J4540 Moderate persistent asthma, uncomplicated: Secondary | ICD-10-CM | POA: Diagnosis not present

## 2016-01-10 MED ORDER — OMALIZUMAB 150 MG ~~LOC~~ SOLR
375.0000 mg | Freq: Once | SUBCUTANEOUS | Status: AC
  Administered 2016-01-09: 375 mg via SUBCUTANEOUS

## 2016-01-15 ENCOUNTER — Ambulatory Visit (INDEPENDENT_AMBULATORY_CARE_PROVIDER_SITE_OTHER): Payer: Medicare HMO | Admitting: *Deleted

## 2016-01-15 DIAGNOSIS — Z5181 Encounter for therapeutic drug level monitoring: Secondary | ICD-10-CM

## 2016-01-15 DIAGNOSIS — I480 Paroxysmal atrial fibrillation: Secondary | ICD-10-CM | POA: Diagnosis not present

## 2016-01-15 LAB — POCT INR: INR: 1.5

## 2016-01-19 ENCOUNTER — Other Ambulatory Visit: Payer: Self-pay | Admitting: Cardiology

## 2016-01-23 ENCOUNTER — Ambulatory Visit (INDEPENDENT_AMBULATORY_CARE_PROVIDER_SITE_OTHER): Payer: Medicare HMO | Admitting: *Deleted

## 2016-01-23 ENCOUNTER — Ambulatory Visit (INDEPENDENT_AMBULATORY_CARE_PROVIDER_SITE_OTHER): Payer: Medicare HMO

## 2016-01-23 DIAGNOSIS — I480 Paroxysmal atrial fibrillation: Secondary | ICD-10-CM

## 2016-01-23 DIAGNOSIS — Z5181 Encounter for therapeutic drug level monitoring: Secondary | ICD-10-CM

## 2016-01-23 DIAGNOSIS — J4540 Moderate persistent asthma, uncomplicated: Secondary | ICD-10-CM | POA: Diagnosis not present

## 2016-01-23 LAB — POCT INR: INR: 2.3

## 2016-01-23 MED ORDER — OMALIZUMAB 150 MG ~~LOC~~ SOLR
375.0000 mg | Freq: Once | SUBCUTANEOUS | Status: AC
  Administered 2016-01-23: 375 mg via SUBCUTANEOUS

## 2016-01-28 ENCOUNTER — Telehealth: Payer: Self-pay | Admitting: Pulmonary Disease

## 2016-01-28 NOTE — Telephone Encounter (Signed)
#   vials:6 Ordered date:01/28/16 Shipping Date:01/28/16

## 2016-01-29 NOTE — Telephone Encounter (Signed)
#   Vials:6 Arrival Date:01/29/16 Lot [id number] Exp Date:12/20

## 2016-02-06 ENCOUNTER — Ambulatory Visit (INDEPENDENT_AMBULATORY_CARE_PROVIDER_SITE_OTHER): Payer: Medicare HMO

## 2016-02-06 DIAGNOSIS — J4540 Moderate persistent asthma, uncomplicated: Secondary | ICD-10-CM | POA: Diagnosis not present

## 2016-02-06 MED ORDER — OMALIZUMAB 150 MG ~~LOC~~ SOLR
375.0000 mg | SUBCUTANEOUS | Status: DC
  Administered 2016-02-06: 375 mg via SUBCUTANEOUS

## 2016-02-20 ENCOUNTER — Telehealth: Payer: Self-pay | Admitting: Pulmonary Disease

## 2016-02-20 ENCOUNTER — Ambulatory Visit (INDEPENDENT_AMBULATORY_CARE_PROVIDER_SITE_OTHER): Payer: Medicare HMO

## 2016-02-20 DIAGNOSIS — J4540 Moderate persistent asthma, uncomplicated: Secondary | ICD-10-CM

## 2016-02-20 MED ORDER — OMALIZUMAB 150 MG ~~LOC~~ SOLR
375.0000 mg | SUBCUTANEOUS | Status: DC
  Administered 2016-02-20: 375 mg via SUBCUTANEOUS

## 2016-02-20 NOTE — Telephone Encounter (Signed)
#   vials:6 Ordered date:02/20/16 Shipping Date:02/27/16

## 2016-02-27 ENCOUNTER — Other Ambulatory Visit: Payer: Self-pay | Admitting: Pulmonary Disease

## 2016-02-28 NOTE — Telephone Encounter (Signed)
#   Vials:6 Arrival Date:02/28/16 Lot [id number] Exp Date:3/21

## 2016-03-05 ENCOUNTER — Ambulatory Visit (INDEPENDENT_AMBULATORY_CARE_PROVIDER_SITE_OTHER): Payer: Medicare HMO

## 2016-03-05 DIAGNOSIS — J4540 Moderate persistent asthma, uncomplicated: Secondary | ICD-10-CM | POA: Diagnosis not present

## 2016-03-05 MED ORDER — OMALIZUMAB 150 MG ~~LOC~~ SOLR
375.0000 mg | SUBCUTANEOUS | Status: DC
  Administered 2016-03-05: 375 mg via SUBCUTANEOUS

## 2016-03-19 ENCOUNTER — Ambulatory Visit (INDEPENDENT_AMBULATORY_CARE_PROVIDER_SITE_OTHER): Payer: Medicare HMO

## 2016-03-19 DIAGNOSIS — J4540 Moderate persistent asthma, uncomplicated: Secondary | ICD-10-CM | POA: Diagnosis not present

## 2016-03-19 MED ORDER — OMALIZUMAB 150 MG ~~LOC~~ SOLR
375.0000 mg | Freq: Once | SUBCUTANEOUS | Status: AC
  Administered 2016-03-19: 375 mg via SUBCUTANEOUS

## 2016-03-24 ENCOUNTER — Telehealth: Payer: Self-pay | Admitting: Pulmonary Disease

## 2016-03-24 NOTE — Telephone Encounter (Signed)
#   Vials:6 Arrival Date:03/24/16 Lot [id number] Exp Date:3/21

## 2016-03-24 NOTE — Telephone Encounter (Signed)
#   vials:6 Ordered date:03/23/16 Forgot to put in. Shipping Date:03/23/16

## 2016-03-27 ENCOUNTER — Ambulatory Visit (INDEPENDENT_AMBULATORY_CARE_PROVIDER_SITE_OTHER): Payer: Medicare HMO

## 2016-03-27 ENCOUNTER — Ambulatory Visit: Payer: Medicare HMO

## 2016-03-27 DIAGNOSIS — Z23 Encounter for immunization: Secondary | ICD-10-CM | POA: Diagnosis not present

## 2016-03-30 ENCOUNTER — Encounter: Payer: Self-pay | Admitting: Cardiology

## 2016-03-30 ENCOUNTER — Ambulatory Visit (INDEPENDENT_AMBULATORY_CARE_PROVIDER_SITE_OTHER): Payer: Medicare HMO | Admitting: Cardiology

## 2016-03-30 VITALS — BP 132/58 | HR 64 | Ht 66.0 in | Wt 135.8 lb

## 2016-03-30 DIAGNOSIS — E785 Hyperlipidemia, unspecified: Secondary | ICD-10-CM

## 2016-03-30 DIAGNOSIS — I480 Paroxysmal atrial fibrillation: Secondary | ICD-10-CM | POA: Diagnosis not present

## 2016-03-30 DIAGNOSIS — I2510 Atherosclerotic heart disease of native coronary artery without angina pectoris: Secondary | ICD-10-CM

## 2016-03-30 DIAGNOSIS — I10 Essential (primary) hypertension: Secondary | ICD-10-CM | POA: Diagnosis not present

## 2016-03-30 DIAGNOSIS — I2583 Coronary atherosclerosis due to lipid rich plaque: Secondary | ICD-10-CM

## 2016-03-30 NOTE — Progress Notes (Signed)
Cardiology Office Note    Date:  03/30/2016   ID:  [name], DOB [birth year], MRN [id number]  PCP:  Lynne Logan, MD  Cardiologist:  Fransico Him, MD   Chief Complaint  Patient presents with  . Coronary Artery Disease  . Hypertension  . Atrial Fibrillation    History of Present Illness:  [name] is a 82 y.o. female with a history of ASCAD/PAF/HTN/dyslipidemia who presents today for followup. Lexiscan myoview 02/2015 showed no ischemia and normal LVF. 2D echo was normal. She is doing well. She denies any anginal chest pain. She denies any dizziness or syncope. She has chronic SOB due to asthma which is stable with her inhalers. She has chronic LE edema which is stable.   Occasionally she will notice a skipped heart beat.      Past Medical History:  Diagnosis Date  . CAD (coronary artery disease) 2007   a. 1997 s/p PCI of diagonal.  . COPD/Asthma   . Diverticulosis   . Esophageal reflux   . Glaucoma   . HTN (hypertension), benign   . Hyperlipidemia   . Malignant melanoma ([place])    a. 09/2012 s/p resection.  [name] PAF (paroxysmal atrial fibrillation) (HCC)    a. intolerant to beta blockers due to asthma and intolerant to CCB due to rash;  b. CHA2DS2VASc = 6-->chronic coumadin;  c. 03/2014 Echo: EF 60-65%, Gr 1 DD, triv AI, mildly dil RV, PASP 85mmhg.  [name] TIA (transient ischemic attack)    a. 02/2011 - chronic coumadin in setting of PAF.    Past Surgical History:  Procedure Laterality Date  . ABDOMINAL HYSTERECTOMY    . APPENDECTOMY    . CHOLECYSTECTOMY    . CORONARY ANGIOPLASTY WITH STENT PLACEMENT    . EYE SURGERY    . NASAL SINUS SURGERY    . VESICOVAGINAL FISTULA CLOSURE W/ TAH      Current Medications: Outpatient Medications Prior to Visit  Medication Sig Dispense Refill  . acetaminophen (TYLENOL) 325 MG tablet Take 325 mg by mouth every 6 (six) hours as needed for moderate pain.     [name] albuterol (PROAIR HFA) 108 (90 Base) MCG/ACT inhaler  Inhale 1-2 puffs into the lungs every 4 (four) hours as needed for wheezing or shortness of breath. 1 Inhaler 3  . albuterol (PROVENTIL HFA;VENTOLIN HFA) 108 (90 BASE) MCG/ACT inhaler Inhale 1-2 puffs into the lungs every 4 (four) hours as needed for wheezing or shortness of breath. 1 Inhaler 3  . ALPRAZolam (XANAX) 0.25 MG tablet Take 0.25 mg by mouth at bedtime as needed for anxiety.    [name] amLODipine-valsartan (EXFORGE) 10-320 MG tablet TAKE ONE TABLET BY MOUTH ONCE DAILY 90 tablet 2  . cetirizine (ZYRTEC) 10 MG tablet Take 10 mg by mouth daily as needed for allergies.    . Cholecalciferol (VITAMIN D) 2000 units tablet Take 2,000 Units by mouth daily.    [name] EPINEPHrine 0.3 mg/0.3 mL IJ SOAJ injection Inject 0.3 mLs (0.3 mg total) into the muscle once. 1 Device 11  . fluticasone (FLONASE) 50 MCG/ACT nasal spray Place 1 spray into both nostrils daily as needed for allergies or rhinitis. 16 g 6  . Fluticasone-Salmeterol (AIRDUO RESPICLICK 99991111) 0000000 MCG/ACT AEPB Inhale 1 puff into the lungs 2 (two) times daily. 1 each 5  . furosemide (LASIX) 40 MG tablet Take 1 tablet (40 mg total) by mouth daily as needed for fluid. 30 tablet 10  . latanoprost (XALATAN) 0.005 % ophthalmic solution  Place 1 drop into both eyes at bedtime.    . Multiple Vitamins-Minerals (MULTIVITAMIN & MINERAL PO) Take 1 tablet by mouth daily.    . pilocarpine (PILOCAR) 1 % ophthalmic solution Place 1 drop into the left eye 3 (three) times daily.     . pravastatin (PRAVACHOL) 20 MG tablet Take 1 tablet (20 mg total) by mouth every evening. 30 tablet 11  . predniSONE (DELTASONE) 10 MG tablet Take 2 tablets (20 mg total) by mouth daily with breakfast. 10 tablet 0  . warfarin (COUMADIN) 2.5 MG tablet Take 2.5-3.75 mg by mouth daily. Take 3.75 mg (1.5 tablets) on Sun, Tues, Thurs, Sat & Take 2.5 mg (1 tablet) on Mon, Wed & Fri    . XOLAIR 150 MG injection MIX EACH VIAL WITH 1.4ML OF DILUENT.  WITHDRAW 1.2ML FROM TWO VIALS & 0.6ML FROM  ONE VIAL & INJECT 375MG  (3 DIFF SITES) SUB-Q EVERY 2WKS. 6 vial 5  . simvastatin (ZOCOR) 5 MG tablet Take 5 mg by mouth daily as needed (Patient states she takes whenever she needs it for cholesterol.).     [name] warfarin (COUMADIN) 2.5 MG tablet TAKE ONE TABLET BY MOUTH ONCE DAILY (Patient not taking: Reported on 03/30/2016) 125 tablet 0   No facility-administered medications prior to visit.      Allergies:   Alphagan [brimonidine]; Apraclonidine; Biaxin [clarithromycin]; Brovana [arformoterol]; Budesonide; Cefdinir; Cephalexin; Cephalexin; Cifenline; Clonidine; Clonidine derivatives; Dorzolamide; Iohexol; Nsaids; Penicillins; Sulfamethoxazole; Sulfonamide derivatives; Tetracycline; Timolol; Vancomycin; Zetia [ezetimibe]; and Cardizem [diltiazem hcl]   Social History   Social History  . Marital status: Divorced    Spouse name: N/A  . Number of children: N/A  . Years of education: N/A   Occupational History  . 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    Social History Main Topics  . Smoking status: Former Smoker    Packs/day: 0.20    Years: 25.00    Types: Cigarettes    Quit date: 07/06/1966  . Smokeless tobacco: Never Used  . Alcohol use No  . Drug use: No  . Sexual activity: Not Asked   Other Topics Concern  . None   Social History Narrative   Lives in [place] with her dog.  She is very independent.     Family History:  The patient's family history includes Cancer in her sister; Colon cancer in her mother; Heart attack in her brother and father; Heart disease in her brother and father; Hemachromatosis in her father; Lymphoma in her sister; Peripheral vascular disease in her mother and sister.   ROS:   Please see the history of present illness.    Review of Systems  Respiratory: Positive for cough.    All other systems reviewed and are negative.  No flowsheet data found.     PHYSICAL EXAM:   VS:  BP (!) 132/58   Pulse 64   Ht 5\' 6"  (1.676 m)   Wt 135 lb 12.8 oz (61.6 kg)    SpO2 98%   BMI 21.92 kg/m    GEN: Well nourished, well developed, in no acute distress  HEENT: normal  Neck: no JVD, carotid bruits, or masses Cardiac: RRR; no murmurs, rubs, or gallops,no edema.  Intact distal pulses bilaterally.  Respiratory:  clear to auscultation bilaterally, normal work of breathing GI: soft, nontender, nondistended, + BS MS: no deformity or atrophy  Skin: warm and dry, no rash Neuro:  Alert and Oriented x 3, Strength and sensation are intact Psych: euthymic mood, full  affect  Wt Readings from Last 3 Encounters:  03/30/16 135 lb 12.8 oz (61.6 kg)  11/28/15 132 lb (59.9 kg)  09/19/15 132 lb (59.9 kg)      Studies/Labs Reviewed:   EKG:  EKG is ordered today.  The ekg ordered today demonstrates   Recent Labs: 09/24/2015: BUN 20; Creat 0.93; Potassium 4.3; Sodium 140 11/20/2015: ALT 8   Lipid Panel    Component Value Date/Time   CHOL 134 11/20/2015 1144   TRIG 64 11/20/2015 1144   HDL 76 11/20/2015 1144   CHOLHDL 1.8 11/20/2015 1144   VLDL 13 11/20/2015 1144   LDLCALC 45 11/20/2015 1144    Additional studies/ records that were reviewed today include:  none    ASSESSMENT:    1. PAF (paroxysmal atrial fibrillation) ([place])   2. Coronary artery disease due to lipid rich plaque   3. Benign essential HTN   4. Hyperlipidemia      PLAN:  In order of problems listed above:  1. PAF - maintaining NSR.  Continue warfarin.  2. ASCAD - s/p remote PCI of the diagonal - she has not had any further angina. Continue statin.  No ASA due to warfarin. 3. HTN - BP controlled on current meds.  Continue amlodipine and ARB. 4. Hyperlipidemia - LDL goal < 70.  Continue statin. LDL at goal at 45.    Medication Adjustments/Labs and Tests Ordered: Current medicines are reviewed at length with the patient today.  Concerns regarding medicines are outlined above.  Medication changes, Labs and Tests ordered today are listed in the Patient Instructions below.  There  are no Patient Instructions on file for this visit.   Signed, Fransico Him, MD  03/30/2016 11:37 AM    [address], [place], [place]  [address] Phone: [phone]; Fax: [phone]

## 2016-03-30 NOTE — Patient Instructions (Signed)

## 2016-04-02 ENCOUNTER — Ambulatory Visit (INDEPENDENT_AMBULATORY_CARE_PROVIDER_SITE_OTHER): Payer: Medicare HMO

## 2016-04-02 DIAGNOSIS — J4540 Moderate persistent asthma, uncomplicated: Secondary | ICD-10-CM | POA: Diagnosis not present

## 2016-04-02 MED ORDER — OMALIZUMAB 150 MG ~~LOC~~ SOLR
375.0000 mg | SUBCUTANEOUS | Status: DC
  Administered 2016-04-02: 375 mg via SUBCUTANEOUS

## 2016-04-14 ENCOUNTER — Telehealth: Payer: Self-pay | Admitting: Pulmonary Disease

## 2016-04-14 MED ORDER — FLUTICASONE PROPIONATE 50 MCG/ACT NA SUSP: 1.0000 | Freq: Every day | NASAL | 6 refills | Status: DC | PRN

## 2016-04-14 NOTE — Telephone Encounter (Signed)
Spoke with pt and advised that refill for Flonase was sent to pharmacy.

## 2016-04-16 ENCOUNTER — Ambulatory Visit: Payer: Medicare HMO

## 2016-04-17 ENCOUNTER — Ambulatory Visit (INDEPENDENT_AMBULATORY_CARE_PROVIDER_SITE_OTHER): Payer: Medicare HMO

## 2016-04-17 DIAGNOSIS — J4540 Moderate persistent asthma, uncomplicated: Secondary | ICD-10-CM | POA: Diagnosis not present

## 2016-04-17 MED ORDER — OMALIZUMAB 150 MG ~~LOC~~ SOLR
375.0000 mg | SUBCUTANEOUS | Status: DC
  Administered 2016-04-17: 375 mg via SUBCUTANEOUS

## 2016-04-21 ENCOUNTER — Telehealth: Payer: Self-pay | Admitting: Pulmonary Disease

## 2016-04-21 NOTE — Telephone Encounter (Signed)
#   vials:6 Ordered date:04/21/16 Shipping Date:04/22/16

## 2016-04-23 NOTE — Telephone Encounter (Signed)
#   Vials:6 Arrival Date:04/23/16 Lot [id number] Exp Date:4/21

## 2016-04-30 ENCOUNTER — Ambulatory Visit (INDEPENDENT_AMBULATORY_CARE_PROVIDER_SITE_OTHER): Payer: Medicare HMO

## 2016-04-30 ENCOUNTER — Ambulatory Visit (INDEPENDENT_AMBULATORY_CARE_PROVIDER_SITE_OTHER): Payer: Medicare HMO | Admitting: Family

## 2016-04-30 ENCOUNTER — Other Ambulatory Visit (INDEPENDENT_AMBULATORY_CARE_PROVIDER_SITE_OTHER): Payer: Self-pay | Admitting: Family

## 2016-04-30 ENCOUNTER — Encounter (INDEPENDENT_AMBULATORY_CARE_PROVIDER_SITE_OTHER): Payer: Self-pay | Admitting: Family

## 2016-04-30 VITALS — Ht 67.0 in | Wt 133.0 lb

## 2016-04-30 DIAGNOSIS — L97521 Non-pressure chronic ulcer of other part of left foot limited to breakdown of skin: Secondary | ICD-10-CM

## 2016-04-30 DIAGNOSIS — L97511 Non-pressure chronic ulcer of other part of right foot limited to breakdown of skin: Secondary | ICD-10-CM

## 2016-04-30 DIAGNOSIS — J4540 Moderate persistent asthma, uncomplicated: Secondary | ICD-10-CM

## 2016-04-30 DIAGNOSIS — M21612 Bunion of left foot: Secondary | ICD-10-CM

## 2016-04-30 DIAGNOSIS — M2042 Other hammer toe(s) (acquired), left foot: Secondary | ICD-10-CM

## 2016-04-30 MED ORDER — MUPIROCIN 2 % EX OINT: 1.0000 "application " | TOPICAL_OINTMENT | Freq: Every day | CUTANEOUS | 6 refills | Status: DC

## 2016-04-30 NOTE — Progress Notes (Signed)
Wound Care Note   Patient: [name]           Date of Birth: 26-Jan-1924           MRN: [id number]             PCP: Lynne Logan, MD Visit Date: 04/30/2016   Assessment & Plan: Visit Diagnoses:  1. Non-pressure chronic ulcer of other part of left foot limited to breakdown of skin ([place])   2. Bunion of great toe of left foot   3. Other hammer toe(s) (acquired), left foot     Plan: Discussed concern for osteomyelitis head of second metatarsal left foot. We'll discuss surgical options with Dr. Sharol Given. Patient will like to proceed and set up surgery ASAP. We will call her and discuss plan and set up a date for surgery.  Follow-Up Instructions: Return in about 1 week (around 05/07/2016), or post op.  Orders:  Orders Placed This Encounter  Procedures  . XR Foot 2 Views Left   No orders of the defined types were placed in this encounter.     Procedures: No notes on file   Clinical Data: No additional findings.   No images are attached to the encounter.   Subjective: No chief complaint on file.   Left foot ulcer bunion deformity with ulceration under the 2nd mth.  HHN changes dressing 2x q wk santyl dressings. No abx. Pink tissue. Small open area. No drainage, redness, swelling or odor. In regular shoe wear and ambulates with rolling walker. Pt is on coumadin of note. No other medications affecting healing has had an xray of her foot recently. " there is a bone that is pressing through the foot causing the ulcer"  Patient states has had previous surgery to correct hammertoe deformity of the left second toe. Following surgery and bunion deformity began. This has been worsening over many years. Was sent for surgical eval by Dr. Melony Overly.   Review of Systems  Constitutional: Negative for chills and fever.    Miscellaneous:  -Home Health Care: [facility]    Objective: Vital Signs: Ht 5\' 7"  (1.702 m)   Wt 133 lb (60.3 kg)   BMI 20.83 kg/m   Physical Exam:  Significant bunion deformity. There is a 1 cm by 3 mm ulceration beneath the second metatarsal head filled with beefy hyper granulation tissue. This does not probe. No drainage or surrounding erythema. Does have fixed on the second toe.  Specialty Comments: No specialty comments available.   PMFS History: Patient Active Problem List   Diagnosis Date Noted  . Encounter for therapeutic drug monitoring 06/11/2014  . Benign essential HTN 03/22/2014  . Wrist laceration 03/01/2014  . PAF (paroxysmal atrial fibrillation) ([place])   . Hyperlipidemia   . Chronic anticoagulation 09/24/2012  . CAD (coronary artery disease) 09/24/2012  . WEIGHT LOSS 08/23/2009  . CHANGE IN BOWELS 08/23/2009  . COLONIC POLYPS, ADENOMATOUS, HX OF 08/23/2009  . Allergic rhinitis due to pollen 11/13/2008  . SINUSITIS, CHRONIC 07/13/2007  . Chronic obstructive airway disease with asthma ([place]) 06/15/2007   Past Medical History:  Diagnosis Date  . CAD (coronary artery disease) 2007   a. 1997 s/p PCI of diagonal.  . COPD/Asthma   . Diverticulosis   . Esophageal reflux   . Glaucoma   . HTN (hypertension), benign   . Hyperlipidemia   . Malignant melanoma ([place])    a. 09/2012 s/p resection.  [name] PAF (paroxysmal atrial fibrillation) (H[facility])    a. intolerant  to beta blockers due to asthma and intolerant to CCB due to rash;  b. CHA2DS2VASc = 6-->chronic coumadin;  c. 03/2014 Echo: EF 60-65%, Gr 1 DD, triv AI, mildly dil RV, PASP 50mmhg.  [name] TIA (transient ischemic attack)    a. 02/2011 - chronic coumadin in setting of PAF.    Family History  Problem Relation Age of Onset  . Heart disease Father   . Hemachromatosis Father   . Heart attack Father   . Colon cancer Mother   . Peripheral vascular disease Mother   . Peripheral vascular disease Sister   . Cancer Sister   . Lymphoma Sister   . Heart attack Brother   . Heart disease Brother    Past Surgical History:  Procedure Laterality Date  . ABDOMINAL HYSTERECTOMY    .  APPENDECTOMY    . CHOLECYSTECTOMY    . CORONARY ANGIOPLASTY WITH STENT PLACEMENT    . EYE SURGERY    . NASAL SINUS SURGERY    . VESICOVAGINAL FISTULA CLOSURE W/ TAH     Social History   Occupational History  . 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    Social History Main Topics  . Smoking status: Former Smoker    Packs/day: 0.20    Years: 25.00    Types: Cigarettes    Quit date: 07/06/1966  . Smokeless tobacco: Never Used  . Alcohol use No  . Drug use: No  . Sexual activity: Not on file

## 2016-05-06 ENCOUNTER — Ambulatory Visit (INDEPENDENT_AMBULATORY_CARE_PROVIDER_SITE_OTHER): Payer: Medicare HMO | Admitting: Orthopedic Surgery

## 2016-05-06 DIAGNOSIS — M205X2 Other deformities of toe(s) (acquired), left foot: Secondary | ICD-10-CM

## 2016-05-06 DIAGNOSIS — M21612 Bunion of left foot: Secondary | ICD-10-CM | POA: Diagnosis not present

## 2016-05-06 NOTE — Progress Notes (Signed)
Wound Care Note   Patient: [name]           Date of Birth: 10/12/1923           MRN: [id number]             PCP: Lynne Logan, MD Visit Date: 05/06/2016   Assessment & Plan: Visit Diagnoses: No diagnosis found.   Bunion deformity with clawing of the second third toes with long second third metatarsals previous resection of the base of the proximal phalanx second toe with fusion of the PIP joint with complete dislocation of the second toe  Plan: Patient states that she has pain with activities of daily living she has failed conservative care she states she does want to proceed with surgical intervention. Recommend that we can proceed with a bunion osteotomy of the Chevron osteotomy of the first metatarsal possible Akin osteotomy of proximal phalanx of the great toe with a Weil osteotomy for the second and third metatarsals. Risk and benefits were discussed including infection neurovascular injury persistent pain and need for additional surgery. Patient states she understands wish to proceed at this time.    Follow-Up Instructions: No Follow-up on file.  Orders:  No orders of the defined types were placed in this encounter.  No orders of the defined types were placed in this encounter.     Procedures: No notes on file   Clinical Data: No additional findings.   No images are attached to the encounter.   Subjective: No chief complaint on file.   Left foot ulcer bunion deformity with ulceration under the 2nd mth.  HHN changes dressing 2x q wk santyl dressings. No abx. Pink tissue. Small open area. No drainage, redness, swelling or odor. In regular shoe wear and ambulates with rolling walker. Pt is on coumadin of note no other medications affecting healing.  has had an xray of her foot recently. " there is a bone that is pressing through the foot causing the ulcer" Patient states has had previous surgery to correct hammertoe deformity of the left second toe.  Following surgery bunion deformity began. This has been worsening over many years. Was sent for surgical eval by Dr. Melony Overly.  Pt saw Erin in office last week and wanted the pt to come in today to discuss surgical options due to concerns for possible osteomylitis of the left second toe.    Review of Systems  Miscellaneous:  -Home Health Care: nursing applies santyl dressing changes 2 x q wk  -Physical Therapy: no     Objective: Vital Signs: There were no vitals taken for this visit.  Physical Exam: Patient is alert oriented no adenopathy well-dressed normal affect normal respiratory effort.  Patient does have an antalgic gait. She has a good dorsalis pedis pulse left foot. Examination she has overlapping of the great toe and the second toe with the second toe dorsal. Patient's prior loosely had an incision over the second toe MTP joint with previous resection of the base of the proximal pharynx of the second toe this second toe was completely dislocated dorsally. Patient has a prominent second and third metatarsal head with chronic ulcer beneath the second third metatarsal head due to the increased pressure. Radiographs are reviewed which shows a bunion deformity shows a long second third metatarsal shows no evidence of any chronic osteomyelitis in the bone. Specialty Comments: No specialty comments available.   PMFS History: Patient Active Problem List   Diagnosis Date Noted  . Encounter for  therapeutic drug monitoring 06/11/2014  . Benign essential HTN 03/22/2014  . Wrist laceration 03/01/2014  . PAF (paroxysmal atrial fibrillation) ([place])   . Hyperlipidemia   . Chronic anticoagulation 09/24/2012  . CAD (coronary artery disease) 09/24/2012  . WEIGHT LOSS 08/23/2009  . CHANGE IN BOWELS 08/23/2009  . COLONIC POLYPS, ADENOMATOUS, HX OF 08/23/2009  . Allergic rhinitis due to pollen 11/13/2008  . SINUSITIS, CHRONIC 07/13/2007  . Chronic obstructive airway disease with asthma ([place])  06/15/2007   Past Medical History:  Diagnosis Date  . CAD (coronary artery disease) 2007   a. 1997 s/p PCI of diagonal.  . COPD/Asthma   . Diverticulosis   . Esophageal reflux   . Glaucoma   . HTN (hypertension), benign   . Hyperlipidemia   . Malignant melanoma ([place])    a. 09/2012 s/p resection.  [name] PAF (paroxysmal atrial fibrillation) ([facility])    a. intolerant to beta blockers due to asthma and intolerant to CCB due to rash;  b. CHA2DS2VASc = 6-->chronic coumadin;  c. 03/2014 Echo: EF 60-65%, Gr 1 DD, triv AI, mildly dil RV, PASP 63mmhg.  [name] TIA (transient ischemic attack)    a. 02/2011 - chronic coumadin in setting of PAF.    Family History  Problem Relation Age of Onset  . Heart disease Father   . Hemachromatosis Father   . Heart attack Father   . Colon cancer Mother   . Peripheral vascular disease Mother   . Peripheral vascular disease Sister   . Cancer Sister   . Lymphoma Sister   . Heart attack Brother   . Heart disease Brother    Past Surgical History:  Procedure Laterality Date  . ABDOMINAL HYSTERECTOMY    . APPENDECTOMY    . CHOLECYSTECTOMY    . CORONARY ANGIOPLASTY WITH STENT PLACEMENT    . EYE SURGERY    . NASAL SINUS SURGERY    . VESICOVAGINAL FISTULA CLOSURE W/ TAH     Social History   Occupational History  . 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    Social History Main Topics  . Smoking status: Former Smoker    Packs/day: 0.20    Years: 25.00    Types: Cigarettes    Quit date: 07/06/1966  . Smokeless tobacco: Never Used  . Alcohol use No  . Drug use: No  . Sexual activity: Not on file

## 2016-05-08 ENCOUNTER — Ambulatory Visit (INDEPENDENT_AMBULATORY_CARE_PROVIDER_SITE_OTHER): Payer: Medicare HMO | Admitting: Orthopedic Surgery

## 2016-05-12 ENCOUNTER — Telehealth: Payer: Self-pay | Admitting: Pulmonary Disease

## 2016-05-12 NOTE — Telephone Encounter (Signed)
Pt states she is scheduled for foot surgery 05/15/16, after she will be going to a rehab facility and will not be able to get allergy injections. Pt states she plans being at rehab facility 2-3 weeks.   Will send to BQ for FYI.

## 2016-05-13 ENCOUNTER — Other Ambulatory Visit (INDEPENDENT_AMBULATORY_CARE_PROVIDER_SITE_OTHER): Payer: Self-pay | Admitting: Family

## 2016-05-13 NOTE — Telephone Encounter (Signed)
OK - thanks

## 2016-05-14 ENCOUNTER — Encounter (HOSPITAL_COMMUNITY): Payer: Self-pay | Admitting: *Deleted

## 2016-05-14 NOTE — Progress Notes (Signed)
Spoke with pt for pre-op call. Pt has hx of a stent placed in 1997. She has not had a cath since. Had stress and echo done in 2016. Her cardiologist is Dr. Golden Hurter, last office visit was 03/30/16. Pt is on Coumadin for A-fib. Instructed by Dr. Sharol Given to stop prior to surgery and last dose was Tuesday, 05/12/16.

## 2016-05-15 ENCOUNTER — Ambulatory Visit (HOSPITAL_COMMUNITY)
Admission: RE | Admit: 2016-05-15 | Discharge: 2016-05-15 | Disposition: A | Discharge status: Home / Self Care | Payer: Medicare HMO | Source: Ambulatory Visit | Attending: Orthopedic Surgery | Admitting: Orthopedic Surgery

## 2016-05-15 ENCOUNTER — Encounter (HOSPITAL_COMMUNITY)
Admission: RE | Disposition: A | Discharge status: Home / Self Care | Payer: Self-pay | Source: Ambulatory Visit | Attending: Orthopedic Surgery

## 2016-05-15 ENCOUNTER — Telehealth (INDEPENDENT_AMBULATORY_CARE_PROVIDER_SITE_OTHER): Payer: Self-pay | Admitting: Orthopedic Surgery

## 2016-05-15 HISTORY — DX: Family history of other specified conditions: Z84.89

## 2016-05-15 HISTORY — DX: Unspecified osteoarthritis, unspecified site: M19.90

## 2016-05-15 HISTORY — DX: Anxiety disorder, unspecified: F41.9

## 2016-05-15 HISTORY — DX: Polyneuropathy, unspecified: G62.9

## 2016-05-15 HISTORY — DX: Stress incontinence (female) (male): N39.3

## 2016-05-15 HISTORY — DX: Constipation, unspecified: K59.00

## 2016-05-15 SURGERY — OSTEOTOMY, WEIL
Anesthesia: Choice | Laterality: Left

## 2016-05-15 NOTE — H&P (Signed)
[name] is an 82 y.o. female.   Chief Complaint: Painful bunion deformity and clawing of the second and third toes left foot HPI: Patient is a 82 year old woman who complains of painful bunion deformity and clawing of her toes left foot she has failed conservative care and wishes to proceed with surgical intervention at this time.  Past Medical History:  Diagnosis Date  . Anxiety   . Arthritis   . CAD (coronary artery disease) 2007   a. 1997 s/p PCI of diagonal.  . Constipation   . COPD/Asthma   . Diverticulosis   . Esophageal reflux   . Family history of adverse reaction to anesthesia    daughter had trouble breathing after surgery, had to be reintubated  . Glaucoma   . HTN (hypertension), benign   . Hyperlipidemia   . Malignant melanoma ([place])    a. 09/2012 s/p resection.  . Neuropathy ([facility])    lower legs  . PAF (paroxysmal atrial fibrillation) ([facility])    a. intolerant to beta blockers due to asthma and intolerant to CCB due to rash;  b. CHA2DS2VASc = 6-->chronic coumadin;  c. 03/2014 Echo: EF 60-65%, Gr 1 DD, triv AI, mildly dil RV, PASP 27mmhg.  [name] TIA (transient ischemic attack)    a. 02/2011 - chronic coumadin in setting of PAF.  [name] Urinary, incontinence, stress female     Past Surgical History:  Procedure Laterality Date  . ABDOMINAL HYSTERECTOMY    . APPENDECTOMY    . BACK SURGERY     lumbar laminectomy  . CHOLECYSTECTOMY    . COLONOSCOPY    . CORONARY ANGIOPLASTY WITH STENT PLACEMENT    . EYE SURGERY    . NASAL SINUS SURGERY    . VESICOVAGINAL FISTULA CLOSURE W/ TAH      Family History  Problem Relation Age of Onset  . Heart disease Father   . Hemachromatosis Father   . Heart attack Father   . Colon cancer Mother   . Peripheral vascular disease Mother   . Peripheral vascular disease Sister   . Cancer Sister   . Lymphoma Sister   . Heart attack Brother   . Heart disease Brother    Social History:  reports that she quit smoking about 49 years  ago. Her smoking use included Cigarettes. She has a 5.00 pack-year smoking history. She has never used smokeless tobacco. She reports that she does not drink alcohol or use drugs.  Allergies:  Allergies  Allergen Reactions  . Alphagan [Brimonidine] Other (See Comments)    Burning sensation  . Apraclonidine Other (See Comments)    Pain, brow pain, tender, not able to tolerate  . Biaxin [Clarithromycin]     Unknown, per pt   . Brovana [Arformoterol] Other (See Comments)    Makes my heart race  . Budesonide Other (See Comments)    Makes my heart race  . Cefdinir     "FEEL HORRIBLE"  . Cephalexin Itching  . Cephalexin Nausea Only  . Cifenline Itching  . Clonidine Other (See Comments)    unknown  . Clonidine Derivatives Other (See Comments)    REACTION: unknown  . Dorzolamide Other (See Comments)    Redness  . Iohexol Other (See Comments)     Desc: NOTES FROM PRIOR CT STATES PRE MEDICATION PRIOR TO OMNIPAQUE ENHANCED CT   . Nsaids Other (See Comments)    unknown unknown  . Penicillins Itching, Swelling and Other (See Comments)    Swelling and edema  of ears and rash Swelling to ears  Has patient had a PCN reaction causing immediate rash, facial/tongue/throat swelling, SOB or lightheadedness with hypotension: {no Has patient had a PCN reaction causing severe rash involving mucus membranes or skin necrosis: {no Has patient had a PCN reaction that required hospitalization no Has patient had a PCN reaction occurring within the last 10 years: no If all of the above answers are "NO", then may proceed with Cephalosporin use.  . Sulfamethoxazole Other (See Comments)    unknown unknown  . Sulfonamide Derivatives Itching  . Tetracycline Itching and Other (See Comments)    unknown  . Timolol Other (See Comments)    Eye red and irritated Eye red and irritated  . Vancomycin Other (See Comments)    unknown Turn red  . Zetia [Ezetimibe] Other (See Comments)    unknown unknown  .  Cardizem [Diltiazem Hcl] Rash    No prescriptions prior to admission.    No results found for this or any previous visit (from the past 48 hour(s)). No results found.  Review of Systems  All other systems reviewed and are negative.   There were no vitals taken for this visit. Physical Exam  On examination patient is alert oriented no adenopathy well-dressed normal affect the worst wear effort she does have a good dorsalis pedis pulse. She has overlapping the great toe and the second toe she has free ulcerative callus beneath the second and third metatarsal heads. She has no open wounds. Assessment/Plan Assessment: Bunion deformity with overlapping the great toe and second toe with a long second and third metatarsal with flexible clawing of the second third toes.  Plan: We'll plan for left Chevron osteotomy possible Akin osteotomy to correct the bunion deformity great toe and a Weil osteotomy for the second and third metatarsals. Risk and benefits were discussed including risk of the wound not healing need for additional surgery risks of infection neurovascular injury. Patient states she understands wish to proceed at this time.  Newt Minion, MD 05/15/2016, 7:15 AM

## 2016-05-15 NOTE — Telephone Encounter (Signed)
We received notice from patient's insurance company that they were not going to authorize her admission for  bunion/claw toe surgery. This surgery is listed to be outpatient only.  Patient wanted to be admitted for 3 days so that she could be discharged to a skilled nursing facility. Ms. Plager wanted to go to SNF post op because she did not have anyone available at home to help her after surgery.  I checked with the social workers/case managers on 5000 and was advised that she would have to be admitted for 3 days in order to qualify to go to SNF.  I called [facility] (patient's choice for rehab) and spoke with Ivin Booty the admissions coordinator.  Private pay rate at their facility for room and board/no therapy is $265 a day. With her insurance she can not be outpatient and go directly to a rehab facility.We discussed her only option was to have surgery and pay out of pocket for SNF or wait until she could have someone available to help her recover at home.  She cannot afford the private pay amount.  She mentioned that she has a sister who will allow her to recover at her house once the sister heals from her fractured arm and can help take care of her and cook and clean.  She is going to call me back when she is ready to reschedule. Dr. Sharol Given is aware that we are cancelling surgery for today and will reschedule in the future.

## 2016-05-20 ENCOUNTER — Ambulatory Visit (INDEPENDENT_AMBULATORY_CARE_PROVIDER_SITE_OTHER): Payer: Medicare HMO | Admitting: Orthopedic Surgery

## 2016-05-21 ENCOUNTER — Ambulatory Visit (INDEPENDENT_AMBULATORY_CARE_PROVIDER_SITE_OTHER): Payer: Medicare HMO

## 2016-05-21 ENCOUNTER — Telehealth: Payer: Self-pay | Admitting: Pulmonary Disease

## 2016-05-21 DIAGNOSIS — J4540 Moderate persistent asthma, uncomplicated: Secondary | ICD-10-CM | POA: Diagnosis not present

## 2016-05-21 NOTE — Telephone Encounter (Signed)
#   Vials:6 Arrival Date:05/21/16 Lot [id number] Exp Date:4/21

## 2016-05-21 NOTE — Telephone Encounter (Signed)
#   vials:6 Ordered date:05/20/16 Shipping Date:05/20/16

## 2016-05-22 MED ORDER — OMALIZUMAB 150 MG ~~LOC~~ SOLR
150.0000 mg | SUBCUTANEOUS | Status: DC
  Administered 2016-04-30: 150 mg via SUBCUTANEOUS

## 2016-05-26 ENCOUNTER — Other Ambulatory Visit (INDEPENDENT_AMBULATORY_CARE_PROVIDER_SITE_OTHER): Payer: Self-pay | Admitting: Family

## 2016-05-26 MED ORDER — OMALIZUMAB 150 MG ~~LOC~~ SOLR
375.0000 mg | Freq: Once | SUBCUTANEOUS | Status: AC
  Administered 2016-05-21: 375 mg via SUBCUTANEOUS

## 2016-06-04 ENCOUNTER — Ambulatory Visit (INDEPENDENT_AMBULATORY_CARE_PROVIDER_SITE_OTHER): Payer: Medicare HMO

## 2016-06-04 ENCOUNTER — Telehealth: Payer: Self-pay | Admitting: Pulmonary Disease

## 2016-06-04 DIAGNOSIS — J4540 Moderate persistent asthma, uncomplicated: Secondary | ICD-10-CM | POA: Diagnosis not present

## 2016-06-04 NOTE — Telephone Encounter (Signed)
Spoke with pt. She is will need a new prescription for Xolair before the end of the year. Pt last saw BQ in 11/2015. He wanted to see her back in 6 months but an appointment was never made. Pt has been scheduled to see BQ on 06/11/16 at 2:15pm. Advised her that we will refill her Xolair at that time. Nothing further was needed.

## 2016-06-05 MED ORDER — OMALIZUMAB 150 MG ~~LOC~~ SOLR
375.0000 mg | SUBCUTANEOUS | Status: DC
  Administered 2016-06-04: 375 mg via SUBCUTANEOUS

## 2016-06-11 ENCOUNTER — Encounter (HOSPITAL_COMMUNITY): Payer: Self-pay | Admitting: *Deleted

## 2016-06-11 ENCOUNTER — Encounter: Payer: Self-pay | Admitting: Pulmonary Disease

## 2016-06-11 ENCOUNTER — Ambulatory Visit (INDEPENDENT_AMBULATORY_CARE_PROVIDER_SITE_OTHER): Payer: Medicare HMO | Admitting: Pulmonary Disease

## 2016-06-11 VITALS — BP 118/72 | HR 78 | Ht 67.0 in | Wt 138.0 lb

## 2016-06-11 DIAGNOSIS — J4540 Moderate persistent asthma, uncomplicated: Secondary | ICD-10-CM

## 2016-06-11 DIAGNOSIS — J301 Allergic rhinitis due to pollen: Secondary | ICD-10-CM

## 2016-06-11 MED ORDER — XOLAIR 150 MG ~~LOC~~ SOLR: 375.0000 mg | SUBCUTANEOUS | 5 refills | Status: DC

## 2016-06-11 MED ORDER — FLUTICASONE FUROATE-VILANTEROL 100-25 MCG/INH IN AEPB: 1.0000 | INHALATION_SPRAY | Freq: Every day | RESPIRATORY_TRACT | 5 refills | Status: DC

## 2016-06-11 NOTE — Progress Notes (Signed)
Subjective:    Patient ID: [name], female    DOB: July 26, 1923, 82 y.o.   MRN: [id number]  Synopsis: Former patient of Dr. Gwenette Greet with asthma and COPD.  She started smoking as a child and smoked until age 30, but she smoked only about 1 cigarette per week.  She started having lung problems in 2000. PFT's 2009:  FEV1 1.23 (64%), ratio 57, DLCO 67%. No change with singulair or higher dose advair [place]ast 2006:  IgE 855, multiple significant allergens. On Xolair with good clinical response >> d/ced 08/2012 due to expense.  Cannot tolerate nebulized bronchodilators for maintenance +nasal polyps per ENT evaluation >> [name].  Recommended surgery but pt declined. On topical nasal steroids.   Elevated Eos on CBC 08/2014  HPI Chief Complaint  Patient presents with  . Follow-up    6 month follow up and a xolair refill. Breathing has been ok for the past few months. Only SOB during activities. No chest pain or tightness.    [name] has been doing well.  Asthma: > no flare ups since the last visit > still tolerating Xolair well > Only has dyspnea if she pushes herself too much: mopping, walking to the mailbox > she only uses albuterol every now and then   Sinuses: >Allergic rhinitis has been flaring up a bit more recently, she had a steroid shot a week ago. Her ENT doctor is considering surgery.   Past Medical History:  Diagnosis Date  . Anxiety   . Arthritis   . CAD (coronary artery disease) 2007   a. 1997 s/p PCI of diagonal.  . Constipation   . COPD/Asthma   . Diverticulosis   . Esophageal reflux   . Family history of adverse reaction to anesthesia    daughter had trouble breathing after surgery, had to be reintubated  . Glaucoma   . HTN (hypertension), benign   . Hyperlipidemia   . Malignant melanoma ([place])    a. 09/2012 s/p resection.  . Neuropathy (HCC)    lower legs  . PAF (paroxysmal atrial fibrillation) (HCC)    a. intolerant to beta blockers due to  asthma and intolerant to CCB due to rash;  b. CHA2DS2VASc = 6-->chronic coumadin;  c. 03/2014 Echo: EF 60-65%, Gr 1 DD, triv AI, mildly dil RV, PASP 63mmhg.  [name] TIA (transient ischemic attack)    a. 02/2011 - chronic coumadin in setting of PAF.  [name] Urinary, incontinence, stress female       Review of Systems  Constitutional: Negative for chills, fatigue and fever.  HENT: Positive for postnasal drip, rhinorrhea and sinus pressure.   Respiratory: Negative for cough, shortness of breath and wheezing.   Cardiovascular: Negative for chest pain, palpitations and leg swelling.       Objective:   Physical Exam Vitals:   06/11/16 1408  BP: 118/72  Pulse: 78  SpO2: 98%  Weight: 138 lb (62.6 kg)  Height: 5\' 7"  (1.702 m)   RA  Gen: well appearing HENT: OP clear, TM's clear, neck supple PULM: CTA B, normal percussion CV: RRR, no mgr, trace edema GI: BS+, soft, nontender Derm: no cyanosis or rash Psyche: normal mood and affect        Assessment & Plan:  Impression: Allergic rhinitis Severe persistent asthma  Discussion: [name] has done remarkably well with the addition of Xolair. She has not had asthma flareups and months. However, she continues to have allergic rhinitis from time to time. She will continue follow-up with  her ear nose and throat physician for this.  For your asthma: Take Breo 1 puff daily Use albuterol as needed Continue Xolair injections as you're doing  For your chronic sinus disease: I recommend continued allergic rhinitis treatment with a nasal steroid, antihistamine such as Zyrtec, and saline rinses with Milta Deiters med rinses on days when symptoms are worse Continue follow-up with ENT  Follow-up with me in 6 months or sooner if needed  Current Outpatient Prescriptions:  .  acetaminophen (TYLENOL) 325 MG tablet, Take 325 mg by mouth every 6 (six) hours as needed for moderate pain. , Disp: , Rfl:  .  albuterol (PROAIR HFA) 108 (90 Base) MCG/ACT inhaler,  Inhale 1-2 puffs into the lungs every 4 (four) hours as needed for wheezing or shortness of breath., Disp: 1 Inhaler, Rfl: 3 .  ALPRAZolam (XANAX) 0.25 MG tablet, Take 0.25 mg by mouth at bedtime as needed for anxiety., Disp: , Rfl:  .  amLODipine-valsartan (EXFORGE) 10-320 MG tablet, TAKE ONE TABLET BY MOUTH ONCE DAILY, Disp: 90 tablet, Rfl: 2 .  cetirizine (ZYRTEC) 10 MG tablet, Take 10 mg by mouth daily as needed for allergies., Disp: , Rfl:  .  Cholecalciferol (VITAMIN D) 2000 units tablet, Take 2,000 Units by mouth daily., Disp: , Rfl:  .  fluticasone (FLONASE) 50 MCG/ACT nasal spray, Place 1 spray into both nostrils daily as needed for allergies or rhinitis., Disp: 16 g, Rfl: 6 .  furosemide (LASIX) 40 MG tablet, Take 1 tablet (40 mg total) by mouth daily as needed for fluid., Disp: 30 tablet, Rfl: 10 .  latanoprost (XALATAN) 0.005 % ophthalmic solution, Place 1 drop into both eyes at bedtime., Disp: , Rfl:  .  Multiple Vitamins-Minerals (MULTIVITAMIN & MINERAL PO), Take 1 tablet by mouth daily., Disp: , Rfl:  .  mupirocin ointment (BACTROBAN) 2 %, Apply 1 application topically daily. (Patient taking differently: Apply 1 application topically daily as needed. ), Disp: 22 g, Rfl: 6 .  pilocarpine (PILOCAR) 1 % ophthalmic solution, Place 1 drop into the left eye 3 (three) times daily., Disp: , Rfl:  .  pravastatin (PRAVACHOL) 20 MG tablet, Take 1 tablet (20 mg total) by mouth every evening., Disp: 30 tablet, Rfl: 11 .  warfarin (COUMADIN) 2.5 MG tablet, Take 2.5-3.75 mg by mouth daily. Take 3.75 mg (1.5 tablets) on Sun, Tues, Weds, Thurs, Sat & Take 2.5 mg (1 tablet) on Mon & Fri, Disp: , Rfl:  .  XOLAIR 150 MG injection, Inject 375 mg into the skin every 14 (fourteen) days. , Disp: , Rfl:   Current Facility-Administered Medications:  .  omalizumab [name]) injection 375 mg, 375 mg, Subcutaneous, Q14 Days, Juanito Doom, MD, 375 mg at 06/04/16 1151

## 2016-06-11 NOTE — Patient Instructions (Signed)
For your asthma: Take Breo 1 puff daily Use albuterol as needed Continue Xolair injections as you're doing  For your chronic sinus disease: I recommend continued allergic rhinitis treatment with a nasal steroid, antihistamine such as Zyrtec, and saline rinses with Milta Deiters med rinses on days when symptoms are worse Continue follow-up with ENT  Follow-up with me in 6 months or sooner if needed

## 2016-06-11 NOTE — Progress Notes (Signed)
Spoke with pt for pre-op call. I had spoken with her last month when she was originally scheduled for surgery and she verified that nothing has changed with her medical and surgical history since then. She states she stopped her Coumadin on 06/09/16. She denies any recent chest pain or fever. States she did have some diarrhea and felt dizzy this past weekend but is feeling better now and has not had any loose stool since Monday.

## 2016-06-12 ENCOUNTER — Ambulatory Visit (HOSPITAL_COMMUNITY): Payer: Medicare HMO | Admitting: Anesthesiology

## 2016-06-12 ENCOUNTER — Encounter (HOSPITAL_COMMUNITY)
Admission: RE | Disposition: A | Discharge status: Skilled Nursing Facility | Payer: Self-pay | Source: Ambulatory Visit | Attending: Orthopedic Surgery

## 2016-06-12 ENCOUNTER — Encounter (HOSPITAL_COMMUNITY): Payer: Self-pay

## 2016-06-12 ENCOUNTER — Ambulatory Visit (HOSPITAL_COMMUNITY)
Admission: RE | Admit: 2016-06-12 | Discharge: 2016-06-15 | Disposition: A | Discharge status: Skilled Nursing Facility | Payer: Medicare HMO | Source: Ambulatory Visit | Attending: Orthopedic Surgery | Admitting: Orthopedic Surgery

## 2016-06-12 DIAGNOSIS — I10 Essential (primary) hypertension: Secondary | ICD-10-CM | POA: Diagnosis not present

## 2016-06-12 DIAGNOSIS — M21611 Bunion of right foot: Secondary | ICD-10-CM | POA: Diagnosis present

## 2016-06-12 DIAGNOSIS — Z8249 Family history of ischemic heart disease and other diseases of the circulatory system: Secondary | ICD-10-CM | POA: Diagnosis not present

## 2016-06-12 DIAGNOSIS — M205X2 Other deformities of toe(s) (acquired), left foot: Secondary | ICD-10-CM | POA: Diagnosis not present

## 2016-06-12 DIAGNOSIS — Z8719 Personal history of other diseases of the digestive system: Secondary | ICD-10-CM | POA: Insufficient documentation

## 2016-06-12 DIAGNOSIS — Z8673 Personal history of transient ischemic attack (TIA), and cerebral infarction without residual deficits: Secondary | ICD-10-CM | POA: Diagnosis not present

## 2016-06-12 DIAGNOSIS — R262 Difficulty in walking, not elsewhere classified: Secondary | ICD-10-CM

## 2016-06-12 DIAGNOSIS — M2042 Other hammer toe(s) (acquired), left foot: Secondary | ICD-10-CM | POA: Diagnosis not present

## 2016-06-12 DIAGNOSIS — J449 Chronic obstructive pulmonary disease, unspecified: Secondary | ICD-10-CM | POA: Diagnosis not present

## 2016-06-12 DIAGNOSIS — K219 Gastro-esophageal reflux disease without esophagitis: Secondary | ICD-10-CM | POA: Diagnosis not present

## 2016-06-12 DIAGNOSIS — Z880 Allergy status to penicillin: Secondary | ICD-10-CM | POA: Insufficient documentation

## 2016-06-12 DIAGNOSIS — Z881 Allergy status to other antibiotic agents status: Secondary | ICD-10-CM | POA: Diagnosis not present

## 2016-06-12 DIAGNOSIS — G5793 Unspecified mononeuropathy of bilateral lower limbs: Secondary | ICD-10-CM | POA: Diagnosis not present

## 2016-06-12 DIAGNOSIS — I480 Paroxysmal atrial fibrillation: Secondary | ICD-10-CM | POA: Insufficient documentation

## 2016-06-12 DIAGNOSIS — E785 Hyperlipidemia, unspecified: Secondary | ICD-10-CM | POA: Insufficient documentation

## 2016-06-12 DIAGNOSIS — M1990 Unspecified osteoarthritis, unspecified site: Secondary | ICD-10-CM | POA: Diagnosis not present

## 2016-06-12 DIAGNOSIS — I2510 Atherosclerotic heart disease of native coronary artery without angina pectoris: Secondary | ICD-10-CM | POA: Insufficient documentation

## 2016-06-12 DIAGNOSIS — Z888 Allergy status to other drugs, medicaments and biological substances status: Secondary | ICD-10-CM | POA: Insufficient documentation

## 2016-06-12 DIAGNOSIS — L97519 Non-pressure chronic ulcer of other part of right foot with unspecified severity: Secondary | ICD-10-CM | POA: Diagnosis not present

## 2016-06-12 DIAGNOSIS — Z800 Family history of malignant neoplasm of digestive organs: Secondary | ICD-10-CM | POA: Diagnosis not present

## 2016-06-12 DIAGNOSIS — M21612 Bunion of left foot: Secondary | ICD-10-CM | POA: Insufficient documentation

## 2016-06-12 DIAGNOSIS — Z882 Allergy status to sulfonamides status: Secondary | ICD-10-CM | POA: Diagnosis not present

## 2016-06-12 DIAGNOSIS — F419 Anxiety disorder, unspecified: Secondary | ICD-10-CM | POA: Diagnosis not present

## 2016-06-12 DIAGNOSIS — Z7901 Long term (current) use of anticoagulants: Secondary | ICD-10-CM | POA: Diagnosis not present

## 2016-06-12 DIAGNOSIS — Z87891 Personal history of nicotine dependence: Secondary | ICD-10-CM | POA: Insufficient documentation

## 2016-06-12 DIAGNOSIS — Z90710 Acquired absence of both cervix and uterus: Secondary | ICD-10-CM | POA: Insufficient documentation

## 2016-06-12 HISTORY — PX: WEIL OSTEOTOMY: SHX5044

## 2016-06-12 LAB — CBC
HEMATOCRIT: 36.8 % (ref 36.0–46.0)
Hemoglobin: 12.6 g/dL (ref 12.0–15.0)
MCH: 31 pg (ref 26.0–34.0)
MCHC: 34.2 g/dL (ref 30.0–36.0)
MCV: 90.4 fL (ref 78.0–100.0)
PLATELETS: 163 10*3/uL (ref 150–400)
RBC: 4.07 MIL/uL (ref 3.87–5.11)
RDW: 14.7 % (ref 11.5–15.5)
WBC: 7.9 10*3/uL (ref 4.0–10.5)

## 2016-06-12 LAB — BASIC METABOLIC PANEL
Anion gap: 7 (ref 5–15)
BUN: 17 mg/dL (ref 6–20)
CHLORIDE: 105 mmol/L (ref 101–111)
CO2: 25 mmol/L (ref 22–32)
CREATININE: 0.87 mg/dL (ref 0.44–1.00)
Calcium: 8.9 mg/dL (ref 8.9–10.3)
GFR calc Af Amer: >60 mL/min (ref >60)
GFR calc non Af Amer: 57 mL/min — ABNORMAL LOW (ref >60)
GLUCOSE: 98 mg/dL (ref 65–99)
Potassium: 3.9 mmol/L (ref 3.5–5.1)
SODIUM: 137 mmol/L (ref 135–145)

## 2016-06-12 LAB — PROTIME-INR
INR: 1.28
Prothrombin Time: 16.1 seconds — ABNORMAL HIGH (ref 11.4–15.2)

## 2016-06-12 SURGERY — OSTEOTOMY, WEIL
Anesthesia: Monitor Anesthesia Care | Site: Foot | Laterality: Left

## 2016-06-12 MED ORDER — MIDAZOLAM HCL 2 MG/2ML IJ SOLN
INTRAMUSCULAR | Status: AC
  Administered 2016-06-12: 1 mg
  Filled 2016-06-12: qty 2

## 2016-06-12 MED ORDER — ACETAMINOPHEN 650 MG RE SUPP: 650.0000 mg | Freq: Four times a day (QID) | RECTAL | Status: DC | PRN

## 2016-06-12 MED ORDER — LATANOPROST 0.005 % OP SOLN
1.0000 [drp] | Freq: Every day | OPHTHALMIC | Status: DC
  Administered 2016-06-12 – 2016-06-14 (×3): 1 [drp] via OPHTHALMIC
  Filled 2016-06-12: qty 2.5

## 2016-06-12 MED ORDER — ALBUTEROL SULFATE (2.5 MG/3ML) 0.083% IN NEBU: 2.5000 mg | INHALATION_SOLUTION | RESPIRATORY_TRACT | Status: DC | PRN

## 2016-06-12 MED ORDER — WARFARIN - PHARMACIST DOSING INPATIENT
Freq: Every day | Status: DC
  Administered 2016-06-13 – 2016-06-14 (×2)

## 2016-06-12 MED ORDER — HYDROMORPHONE HCL 2 MG/ML IJ SOLN: 1.0000 mg | INTRAMUSCULAR | Status: DC | PRN

## 2016-06-12 MED ORDER — LACTATED RINGERS IV SOLN
INTRAVENOUS | Status: DC
  Administered 2016-06-12 (×2): via INTRAVENOUS

## 2016-06-12 MED ORDER — METHOCARBAMOL 1000 MG/10ML IJ SOLN: 500.0000 mg | Freq: Four times a day (QID) | INTRAVENOUS | Status: DC | PRN

## 2016-06-12 MED ORDER — FLUTICASONE FUROATE-VILANTEROL 100-25 MCG/INH IN AEPB
1.0000 | INHALATION_SPRAY | Freq: Every day | RESPIRATORY_TRACT | Status: DC
  Administered 2016-06-12 – 2016-06-15 (×4): 1 via RESPIRATORY_TRACT
  Filled 2016-06-12: qty 28

## 2016-06-12 MED ORDER — ACETAMINOPHEN 325 MG PO TABS: 650.0000 mg | ORAL_TABLET | Freq: Four times a day (QID) | ORAL | Status: DC | PRN

## 2016-06-12 MED ORDER — FENTANYL CITRATE (PF) 100 MCG/2ML IJ SOLN: 25.0000 ug | INTRAMUSCULAR | Status: DC | PRN

## 2016-06-12 MED ORDER — METHOCARBAMOL 500 MG PO TABS
500.0000 mg | ORAL_TABLET | Freq: Four times a day (QID) | ORAL | Status: DC | PRN
  Administered 2016-06-12 – 2016-06-13 (×2): 500 mg via ORAL
  Filled 2016-06-12 (×2): qty 1

## 2016-06-12 MED ORDER — FUROSEMIDE 40 MG PO TABS
40.0000 mg | ORAL_TABLET | Freq: Every day | ORAL | Status: DC | PRN
  Administered 2016-06-12: 40 mg via ORAL
  Filled 2016-06-12: qty 1

## 2016-06-12 MED ORDER — AMLODIPINE BESYLATE 10 MG PO TABS
10.0000 mg | ORAL_TABLET | Freq: Every day | ORAL | Status: DC
  Administered 2016-06-12 – 2016-06-15 (×4): 10 mg via ORAL
  Filled 2016-06-12 (×4): qty 1

## 2016-06-12 MED ORDER — MEPERIDINE HCL 25 MG/ML IJ SOLN: 6.2500 mg | INTRAMUSCULAR | Status: DC | PRN

## 2016-06-12 MED ORDER — WARFARIN SODIUM 4 MG PO TABS
4.0000 mg | ORAL_TABLET | Freq: Once | ORAL | Status: AC
  Administered 2016-06-12: 4 mg via ORAL
  Filled 2016-06-12: qty 1

## 2016-06-12 MED ORDER — WARFARIN SODIUM 5 MG PO TABS: 2.5000 mg | ORAL_TABLET | ORAL | Status: DC

## 2016-06-12 MED ORDER — CLINDAMYCIN PHOSPHATE 900 MG/50ML IV SOLN
900.0000 mg | INTRAVENOUS | Status: AC
  Administered 2016-06-12: 900 mg via INTRAVENOUS
  Filled 2016-06-12: qty 50

## 2016-06-12 MED ORDER — PILOCARPINE HCL 1 % OP SOLN
1.0000 [drp] | Freq: Three times a day (TID) | OPHTHALMIC | Status: DC
  Administered 2016-06-12 – 2016-06-15 (×9): 1 [drp] via OPHTHALMIC
  Filled 2016-06-12 (×2): qty 15

## 2016-06-12 MED ORDER — WARFARIN SODIUM 7.5 MG PO TABS: 3.7500 mg | ORAL_TABLET | ORAL | Status: DC

## 2016-06-12 MED ORDER — ONDANSETRON HCL 4 MG PO TABS: 4.0000 mg | ORAL_TABLET | Freq: Four times a day (QID) | ORAL | Status: DC | PRN

## 2016-06-12 MED ORDER — FENTANYL CITRATE (PF) 100 MCG/2ML IJ SOLN
INTRAMUSCULAR | Status: AC
  Administered 2016-06-12: 50 ug
  Filled 2016-06-12: qty 2

## 2016-06-12 MED ORDER — ALPRAZOLAM 0.25 MG PO TABS
0.2500 mg | ORAL_TABLET | Freq: Every evening | ORAL | Status: DC | PRN
  Administered 2016-06-12 – 2016-06-14 (×2): 0.25 mg via ORAL
  Filled 2016-06-12 (×2): qty 1

## 2016-06-12 MED ORDER — ROPIVACAINE HCL 7.5 MG/ML IJ SOLN
INTRAMUSCULAR | Status: DC | PRN
  Administered 2016-06-12: 15 mL via PERINEURAL

## 2016-06-12 MED ORDER — LIDOCAINE-EPINEPHRINE (PF) 1.5 %-1:200000 IJ SOLN
INTRAMUSCULAR | Status: DC | PRN
  Administered 2016-06-12: 25 mL

## 2016-06-12 MED ORDER — ONDANSETRON HCL 4 MG/2ML IJ SOLN: 4.0000 mg | Freq: Once | INTRAMUSCULAR | Status: DC | PRN

## 2016-06-12 MED ORDER — CLINDAMYCIN PHOSPHATE 600 MG/50ML IV SOLN
600.0000 mg | Freq: Four times a day (QID) | INTRAVENOUS | Status: AC
  Administered 2016-06-12 – 2016-06-13 (×3): 600 mg via INTRAVENOUS
  Filled 2016-06-12 (×3): qty 50

## 2016-06-12 MED ORDER — PROPOFOL 10 MG/ML IV BOLUS
INTRAVENOUS | Status: AC
  Filled 2016-06-12: qty 20

## 2016-06-12 MED ORDER — PROPOFOL 500 MG/50ML IV EMUL
INTRAVENOUS | Status: DC | PRN
  Administered 2016-06-12: 50 ug/kg/min via INTRAVENOUS

## 2016-06-12 MED ORDER — PHENYLEPHRINE HCL 10 MG/ML IJ SOLN
INTRAMUSCULAR | Status: DC | PRN
  Administered 2016-06-12: 80 ug via INTRAVENOUS
  Administered 2016-06-12: 40 ug via INTRAVENOUS

## 2016-06-12 MED ORDER — METOCLOPRAMIDE HCL 5 MG PO TABS: 5.0000 mg | ORAL_TABLET | Freq: Three times a day (TID) | ORAL | Status: DC | PRN

## 2016-06-12 MED ORDER — CHLORHEXIDINE GLUCONATE 4 % EX LIQD: 60.0000 mL | Freq: Once | CUTANEOUS | Status: DC

## 2016-06-12 MED ORDER — ONDANSETRON HCL 4 MG/2ML IJ SOLN: 4.0000 mg | Freq: Four times a day (QID) | INTRAMUSCULAR | Status: DC | PRN

## 2016-06-12 MED ORDER — OXYCODONE HCL 5 MG PO TABS: 5.0000 mg | ORAL_TABLET | ORAL | Status: DC | PRN

## 2016-06-12 MED ORDER — AMLODIPINE BESYLATE-VALSARTAN 10-320 MG PO TABS: 1.0000 | ORAL_TABLET | Freq: Every day | ORAL | Status: DC

## 2016-06-12 MED ORDER — METOCLOPRAMIDE HCL 5 MG/ML IJ SOLN: 5.0000 mg | Freq: Three times a day (TID) | INTRAMUSCULAR | Status: DC | PRN

## 2016-06-12 MED ORDER — 0.9 % SODIUM CHLORIDE (POUR BTL) OPTIME
TOPICAL | Status: DC | PRN
  Administered 2016-06-12: 1000 mL

## 2016-06-12 MED ORDER — SODIUM CHLORIDE 0.9 % IV SOLN
INTRAVENOUS | Status: DC
  Administered 2016-06-12: 22:00:00 via INTRAVENOUS

## 2016-06-12 MED ORDER — PRAVASTATIN SODIUM 20 MG PO TABS
20.0000 mg | ORAL_TABLET | Freq: Every evening | ORAL | Status: DC
  Administered 2016-06-12 – 2016-06-14 (×3): 20 mg via ORAL
  Filled 2016-06-12 (×3): qty 1

## 2016-06-12 MED ORDER — IRBESARTAN 300 MG PO TABS
300.0000 mg | ORAL_TABLET | Freq: Every day | ORAL | Status: DC
  Administered 2016-06-12 – 2016-06-15 (×4): 300 mg via ORAL
  Filled 2016-06-12 (×4): qty 1

## 2016-06-12 SURGICAL SUPPLY — 51 items
BIT DRILL PROS QC 1.5 (BIT) ×1 IMPLANT
BIT DRILL PROS QC 1.5MM (BIT) ×1
BLADE AVERAGE 25MMX9MM (BLADE)
BLADE AVERAGE 25X9 (BLADE) IMPLANT
BLADE MINI RND TIP GREEN BEAV (BLADE) IMPLANT
BNDG CMPR 9X4 STRL LF SNTH (GAUZE/BANDAGES/DRESSINGS) ×1
BNDG COHESIVE 1X5 TAN STRL LF (GAUZE/BANDAGES/DRESSINGS) IMPLANT
BNDG COHESIVE 6X5 TAN STRL LF (GAUZE/BANDAGES/DRESSINGS) ×2 IMPLANT
BNDG ESMARK 4X9 LF (GAUZE/BANDAGES/DRESSINGS) ×3 IMPLANT
BNDG GAUZE ELAST 4 BULKY (GAUZE/BANDAGES/DRESSINGS) ×2 IMPLANT
BNDG GAUZE STRTCH 6 (GAUZE/BANDAGES/DRESSINGS) IMPLANT
CORDS BIPOLAR (ELECTRODE) ×3 IMPLANT
COTTON STERILE ROLL (GAUZE/BANDAGES/DRESSINGS) IMPLANT
COVER SURGICAL LIGHT HANDLE (MISCELLANEOUS) ×6 IMPLANT
CUFF TOURNIQUET SINGLE 18IN (TOURNIQUET CUFF) IMPLANT
CUFF TOURNIQUET SINGLE 24IN (TOURNIQUET CUFF) IMPLANT
DRAPE OEC MINIVIEW 54X84 (DRAPES) IMPLANT
DRAPE U-SHAPE 47X51 STRL (DRAPES) ×3 IMPLANT
DRSG ADAPTIC 3X8 NADH LF (GAUZE/BANDAGES/DRESSINGS) ×2 IMPLANT
DURAPREP 26ML APPLICATOR (WOUND CARE) ×3 IMPLANT
ELECT REM PT RETURN 9FT ADLT (ELECTROSURGICAL) ×3
ELECTRODE REM PT RTRN 9FT ADLT (ELECTROSURGICAL) ×1 IMPLANT
GAUZE SPONGE 4X4 12PLY STRL (GAUZE/BANDAGES/DRESSINGS) IMPLANT
GLOVE BIOGEL PI IND STRL 9 (GLOVE) ×1 IMPLANT
GLOVE BIOGEL PI INDICATOR 9 (GLOVE) ×2
GLOVE SURG ORTHO 9.0 STRL STRW (GLOVE) ×3 IMPLANT
GOWN STRL REUS W/ TWL XL LVL3 (GOWN DISPOSABLE) ×2 IMPLANT
GOWN STRL REUS W/TWL XL LVL3 (GOWN DISPOSABLE) ×6
KIT BASIN OR (CUSTOM PROCEDURE TRAY) ×3 IMPLANT
KIT ROOM TURNOVER OR (KITS) ×3 IMPLANT
MANIFOLD NEPTUNE II (INSTRUMENTS) ×3 IMPLANT
NDL HYPO 25GX1X1/2 BEV (NEEDLE) IMPLANT
NEEDLE HYPO 25GX1X1/2 BEV (NEEDLE) IMPLANT
NS IRRIG 1000ML POUR BTL (IV SOLUTION) ×3 IMPLANT
PACK ORTHO EXTREMITY (CUSTOM PROCEDURE TRAY) ×3 IMPLANT
PAD ARMBOARD 7.5X6 YLW CONV (MISCELLANEOUS) ×6 IMPLANT
PAD CAST 4YDX4 CTTN HI CHSV (CAST SUPPLIES) IMPLANT
PADDING CAST COTTON 4X4 STRL (CAST SUPPLIES)
SCREW CORTEX ST 2.0X12 (Screw) ×4 IMPLANT
SPECIMEN JAR SMALL (MISCELLANEOUS) ×3 IMPLANT
SPONGE GAUZE 4X4 12PLY STER LF (GAUZE/BANDAGES/DRESSINGS) ×2 IMPLANT
SUCTION FRAZIER HANDLE 10FR (MISCELLANEOUS)
SUCTION TUBE FRAZIER 10FR DISP (MISCELLANEOUS) IMPLANT
SUT ETHILON 2 0 FS 18 (SUTURE) IMPLANT
SUT VIC AB 2-0 FS1 27 (SUTURE) IMPLANT
SYR CONTROL 10ML LL (SYRINGE) IMPLANT
TOWEL OR 17X24 6PK STRL BLUE (TOWEL DISPOSABLE) ×3 IMPLANT
TOWEL OR 17X26 10 PK STRL BLUE (TOWEL DISPOSABLE) ×3 IMPLANT
TUBE CONNECTING 12'X1/4 (SUCTIONS)
TUBE CONNECTING 12X1/4 (SUCTIONS) IMPLANT
WATER STERILE IRR 1000ML POUR (IV SOLUTION) ×3 IMPLANT

## 2016-06-12 NOTE — H&P (Signed)
[name] is an 82 y.o. female.   Chief Complaint: Painful bunion and claw toe deformity left foot HPI: Patient is a 82 year old woman who presents with painful bunion and claw toe deformity she has failed conservative treatment and wishes to proceed with surgical intervention.  Past Medical History:  Diagnosis Date  . Anxiety   . Arthritis   . CAD (coronary artery disease) 2007   a. 1997 s/p PCI of diagonal.  . Constipation   . COPD/Asthma   . Diverticulosis   . Esophageal reflux   . Family history of adverse reaction to anesthesia    daughter had trouble breathing after surgery, had to be reintubated  . Glaucoma   . HTN (hypertension), benign   . Hyperlipidemia   . Malignant melanoma ([place])    a. 09/2012 s/p resection.  . Neuropathy (HCC)    lower legs  . PAF (paroxysmal atrial fibrillation) (HCC)    a. intolerant to beta blockers due to asthma and intolerant to CCB due to rash;  b. CHA2DS2VASc = 6-->chronic coumadin;  c. 03/2014 Echo: EF 60-65%, Gr 1 DD, triv AI, mildly dil RV, PASP 31mmhg.  [name] TIA (transient ischemic attack)    a. 02/2011 - chronic coumadin in setting of PAF.  [name] Urinary, incontinence, stress female     Past Surgical History:  Procedure Laterality Date  . ABDOMINAL HYSTERECTOMY    . APPENDECTOMY    . BACK SURGERY     lumbar laminectomy  . CHOLECYSTECTOMY    . COLONOSCOPY    . CORONARY ANGIOPLASTY WITH STENT PLACEMENT    . EYE SURGERY    . NASAL SINUS SURGERY    . VESICOVAGINAL FISTULA CLOSURE W/ TAH      Family History  Problem Relation Age of Onset  . Heart disease Father   . Hemachromatosis Father   . Heart attack Father   . Colon cancer Mother   . Peripheral vascular disease Mother   . Peripheral vascular disease Sister   . Cancer Sister   . Lymphoma Sister   . Heart attack Brother   . Heart disease Brother    Social History:  reports that she quit smoking about 49 years ago. Her smoking use included Cigarettes. She has a 5.00  pack-year smoking history. She has never used smokeless tobacco. She reports that she does not drink alcohol or use drugs.  Allergies:  Allergies  Allergen Reactions  . Alphagan [Brimonidine] Other (See Comments)    Burning sensation  . Apraclonidine Other (See Comments)    Pain, brow pain, tender, not able to tolerate  . Biaxin [Clarithromycin]     Unknown, per pt   . Brovana [Arformoterol] Other (See Comments)    Makes my heart race  . Budesonide Other (See Comments)    Makes my heart race  . Cefdinir     "FEEL HORRIBLE"  . Cephalexin Itching  . Cephalexin Nausea Only  . Cifenline Itching  . Clonidine Other (See Comments)    unknown  . Clonidine Derivatives Other (See Comments)    REACTION: unknown  . Dorzolamide Other (See Comments)    Redness  . Iohexol Other (See Comments)     Desc: NOTES FROM PRIOR CT STATES PRE MEDICATION PRIOR TO OMNIPAQUE ENHANCED CT   . Nsaids Other (See Comments)    unknown unknown  . Penicillins Itching, Swelling and Other (See Comments)    Swelling and edema of ears and rash Swelling to ears  Has patient had a  PCN reaction causing immediate rash, facial/tongue/throat swelling, SOB or lightheadedness with hypotension: {no Has patient had a PCN reaction causing severe rash involving mucus membranes or skin necrosis: {no Has patient had a PCN reaction that required hospitalization no Has patient had a PCN reaction occurring within the last 10 years: no If all of the above answers are "NO", then may proceed with Cephalosporin use.  . Sulfamethoxazole Other (See Comments)    unknown unknown  . Sulfonamide Derivatives Itching  . Tetracycline Itching and Other (See Comments)    unknown  . Timolol Other (See Comments)    Eye red and irritated Eye red and irritated  . Vancomycin Other (See Comments)    unknown Turn red  . Zetia [Ezetimibe] Other (See Comments)    unknown unknown  . Cardizem [Diltiazem Hcl] Rash    Facility-Administered  Medications Prior to Admission  Medication Dose Route Frequency Provider Last Rate Last Dose  . omalizumab [name]) injection 375 mg  375 mg Subcutaneous Q14 Days Juanito Doom, MD   375 mg at 06/04/16 1151   Medications Prior to Admission  Medication Sig Dispense Refill  . acetaminophen (TYLENOL) 325 MG tablet Take 325 mg by mouth every 6 (six) hours as needed for moderate pain.     [name] albuterol (PROAIR HFA) 108 (90 Base) MCG/ACT inhaler Inhale 1-2 puffs into the lungs every 4 (four) hours as needed for wheezing or shortness of breath. 1 Inhaler 3  . ALPRAZolam (XANAX) 0.25 MG tablet Take 0.25 mg by mouth at bedtime as needed for anxiety.    [name] amLODipine-valsartan (EXFORGE) 10-320 MG tablet TAKE ONE TABLET BY MOUTH ONCE DAILY 90 tablet 2  . cetirizine (ZYRTEC) 10 MG tablet Take 10 mg by mouth daily as needed for allergies.    . Cholecalciferol (VITAMIN D) 2000 units tablet Take 2,000 Units by mouth daily.    . fluticasone (FLONASE) 50 MCG/ACT nasal spray Place 1 spray into both nostrils daily as needed for allergies or rhinitis. 16 g 6  . furosemide (LASIX) 40 MG tablet Take 1 tablet (40 mg total) by mouth daily as needed for fluid. 30 tablet 10  . latanoprost (XALATAN) 0.005 % ophthalmic solution Place 1 drop into both eyes at bedtime.    . Multiple Vitamins-Minerals (MULTIVITAMIN & MINERAL PO) Take 1 tablet by mouth daily.    . mupirocin ointment (BACTROBAN) 2 % Apply 1 application topically daily. (Patient taking differently: Apply 1 application topically daily as needed. ) 22 g 6  . pilocarpine (PILOCAR) 1 % ophthalmic solution Place 1 drop into the left eye 3 (three) times daily.    . pravastatin (PRAVACHOL) 20 MG tablet Take 1 tablet (20 mg total) by mouth every evening. 30 tablet 11  . warfarin (COUMADIN) 2.5 MG tablet Take 2.5-3.75 mg by mouth daily. Take 3.75 mg (1.5 tablets) on Sun, Tues, Weds, Thurs, Sat & Take 2.5 mg (1 tablet) on Mon & Fri    . fluticasone furoate-vilanterol  (BREO ELLIPTA) 100-25 MCG/INH AEPB Inhale 1 puff into the lungs daily. 30 each 5  . XOLAIR 150 MG injection Inject 375 mg into the skin every 14 (fourteen) days. 6 each 5    No results found for this or any previous visit (from the past 48 hour(s)). No results found.  Review of Systems  All other systems reviewed and are negative.   Blood pressure (!) 157/64, pulse 93, temperature 97.2 F (36.2 C), temperature source Oral, resp. rate 18, weight 138 lb (62.6  kg), SpO2 100 %. Physical Exam  Examination patient is alert oriented no adenopathy well-dressed normal affect normal respiratory effort she does have an antalgic gait she has palpable pulses. She is overlapping the great toe and second toe. She has had recurrent ulcerations. Assessment/Plan Assessment: Bunion and claw toe deformity left foot.  Plan: We'll plan for a chevron and Akin osteotomy for the great toe while osteotomy for the second metatarsal. Risk and benefits were discussed including infection neurovascular injury nonhealing the wound need for additional surgery. Patient states she understands wish to proceed at this time.  Newt Minion, MD 06/12/2016, 11:48 AM

## 2016-06-12 NOTE — Transfer of Care (Signed)
Immediate Anesthesia Transfer of Care Note  Patient: [name]  Procedure(s) Performed: Procedure(s): Left Danton Clap and Aiken Left Great Toe, Weil Osteotomy 2nd and 3rd Metatarsal (Left)  Patient Location: PACU  Anesthesia Type:MAC and Regional  Level of Consciousness: awake, alert , oriented and patient cooperative  Airway & Oxygen Therapy: Patient Spontanous Breathing  Post-op Assessment: Report given to RN and Post -op Vital signs reviewed and stable  Post vital signs: Reviewed and stable  Last Vitals:  Vitals:   06/12/16 1035  BP: (!) 157/64  Pulse: 93  Resp: 18  Temp: 36.2 C    Last Pain:  Vitals:   06/12/16 1035  TempSrc: Oral         Complications: No apparent anesthesia complications

## 2016-06-12 NOTE — Progress Notes (Signed)
ANTICOAGULATION CONSULT NOTE - Initial Consult  Pharmacy Consult for coumadin Indication: atrial fibrillation  Allergies  Allergen Reactions  . Alphagan [Brimonidine] Other (See Comments)    Burning sensation  . Apraclonidine Other (See Comments)    Pain, brow pain, tender, not able to tolerate  . Biaxin [Clarithromycin]     Unknown, per pt   . Brovana [Arformoterol] Other (See Comments)    Makes my heart race  . Budesonide Other (See Comments)    Makes my heart race  . Cefdinir     "FEEL HORRIBLE"  . Cephalexin Itching  . Cephalexin Nausea Only  . Cifenline Itching  . Clonidine Other (See Comments)    unknown  . Clonidine Derivatives Other (See Comments)    REACTION: unknown  . Dorzolamide Other (See Comments)    Redness  . Iohexol Other (See Comments)     Desc: NOTES FROM PRIOR CT STATES PRE MEDICATION PRIOR TO OMNIPAQUE ENHANCED CT   . Nsaids Other (See Comments)    unknown unknown  . Penicillins Itching, Swelling and Other (See Comments)    Swelling and edema of ears and rash Swelling to ears  Has patient had a PCN reaction causing immediate rash, facial/tongue/throat swelling, SOB or lightheadedness with hypotension: {no Has patient had a PCN reaction causing severe rash involving mucus membranes or skin necrosis: {no Has patient had a PCN reaction that required hospitalization no Has patient had a PCN reaction occurring within the last 10 years: no If all of the above answers are "NO", then may proceed with Cephalosporin use.  . Sulfamethoxazole Other (See Comments)    unknown unknown  . Sulfonamide Derivatives Itching  . Tetracycline Itching and Other (See Comments)    unknown  . Timolol Other (See Comments)    Eye red and irritated Eye red and irritated  . Vancomycin Other (See Comments)    unknown Turn red  . Zetia [Ezetimibe] Other (See Comments)    unknown unknown  . Cardizem [Diltiazem Hcl] Rash    Patient Measurements: Weight: 138 lb (62.6  kg)   Vital Signs: Temp: 97.7 F (36.5 C) (12/08 1420) Temp Source: Oral (12/08 1035) BP: 125/56 (12/08 1450) Pulse Rate: 83 (12/08 1450)  Labs:  Recent Labs  06/12/16 1128  HGB 12.6  HCT 36.8  PLT 163  LABPROT 16.1*  INR 1.28  CREATININE 0.87    Estimated Creatinine Clearance: 41.8 mL/min (by C-G formula based on SCr of 0.87 mg/dL).   Medical History: Past Medical History:  Diagnosis Date  . Anxiety   . Arthritis   . CAD (coronary artery disease) 2007   a. 1997 s/p PCI of diagonal.  . Constipation   . COPD/Asthma   . Diverticulosis   . Esophageal reflux   . Family history of adverse reaction to anesthesia    daughter had trouble breathing after surgery, had to be reintubated  . Glaucoma   . HTN (hypertension), benign   . Hyperlipidemia   . Malignant melanoma ([place])    a. 09/2012 s/p resection.  . Neuropathy (HCC)    lower legs  . PAF (paroxysmal atrial fibrillation) (HCC)    a. intolerant to beta blockers due to asthma and intolerant to CCB due to rash;  b. CHA2DS2VASc = 6-->chronic coumadin;  c. 03/2014 Echo: EF 60-65%, Gr 1 DD, triv AI, mildly dil RV, PASP 50mmhg.  [name] TIA (transient ischemic attack)    a. 02/2011 - chronic coumadin in setting of PAF.  [name] Urinary, incontinence, stress  female     Assessment: 59 YOF s/p weil osteotomy of L foot today. She is on coumadin PTA for afib, Pharmacy is consulted to resume coumadin. INR 1.28  PTA dose: 3.75mg  on Sun/Tue/Wed/Thur/Sat, 2.5 mg on Mon/Fri. Last dose 12/5 Goal of Therapy:  INR 2-3 Monitor platelets by anticoagulation protocol: Yes   Plan:  Coumadin 4 mg po x 1 tonight Daily PT/INR  Maryanna Shape, PharmD, BCPS  Clinical Pharmacist  Pager: [phone]   06/12/2016,3:51 PM

## 2016-06-12 NOTE — Op Note (Signed)
06/12/2016  2:32 PM  PATIENT:  [name]    PRE-OPERATIVE DIAGNOSIS:  Bunion and Claw Toes Left 2nd and 3rd  POST-OPERATIVE DIAGNOSIS:  Same  PROCEDURE:  Left great toe Chevron ,  Aiken Left Great Toe,  Weil Osteotomy 2nd and 3rd Metatarsal C-arm  SURGEON:  Newt Minion, MD  PHYSICIAN ASSISTANT:None ANESTHESIA:   General  PREOPERATIVE INDICATIONS:  [name] is a  82 y.o. female with a diagnosis of Bunion and Claw Toes Left 2nd and 3rd who failed conservative measures and elected for surgical management.    The risks benefits and alternatives were discussed with the patient preoperatively including but not limited to the risks of infection, bleeding, nerve injury, cardiopulmonary complications, the need for revision surgery, among others, and the patient was willing to proceed.  OPERATIVE IMPLANTS: 0.0625 K wire and 2 mm mini frag screws  OPERATIVE FINDINGS: Soft osteoporotic bone  OPERATIVE PROCEDURE: Patient brought the operating room and underwent an ankle block. After adequate levels anesthesia obtained patient's left lower extremity was prepped using DuraPrep draped into a sterile field. A timeout was called. A medial longitudinal incision was made along the great toe MTP joint. This was carried down to the retinaculum which was incised a football shaped retinaculum was incised from the plantar aspect. Ostectomy was performed followed by a Chevron osteotomy and metatarsal head was translated laterally approximately 4 mm. This was stabilized with a 0.0625 K wire to second ostectomy is again performed. A Aiken osteotomy was then performed at the base of the proximal phalanx great toe this was stabilized with a 0.0625 K wire. The wound was irrigated with normal saline and the retinaculum was closed using 2-0 Vicryl the skin was closed using 2-0 nylon. A second incision was then made dorsally over the second and third metatarsals in the second webspace. Blunt  dissection was carried down to the MTP joint of the second toe the capsule was incised longitudinally retractors are placed a Weil osteotomy was performed metatarsal head was translated proximally overhanging bone was resected and stabilized with a 2 x 12 mm mini frag screw. Attention was then focused on the third metatarsal retractors were placed longitudinal incision was made over the capsule and a Weil osteotomy was performed for the third metatarsal metatarsal head was translated proximally overhanging bone was resected and this was stabilized with a 2 x 12 mm mini frag screw. The wounds were irrigated with normal saline hemostasis was obtained. C-arm floss be verified alignment both AP and lateral planes. The incision was closed using 2-0 nylon. A sterile compressive dressing was applied patient was taken to PACU in stable condition  Anticipate patient will need discharge to skilled nursing. Patient and family state they would like to go to [facility]. We'll have this evaluated postoperatively tomorrow.

## 2016-06-12 NOTE — Progress Notes (Signed)
Orthopedic Tech Progress Note Patient Details:  JOSLYNNE BALSER [birth year] [id number]  Ortho Devices Type of Ortho Device: CAM walker Ortho Device/Splint Location: Provided Cam Walker for Left Foot for PT/OT Training.  Cam Walker placed on counter in Room [phone]. Ortho Device/Splint Interventions: Other (comment)   Kristopher Oppenheim 06/12/2016, 7:31 PM

## 2016-06-12 NOTE — Progress Notes (Signed)
Pt admitted to unit from pacu at 1530 via bed; pt A&O X4; MAE x4; pt report no sensation to LLE d/t block received during surgery. Pt oriented tot he unit and room; IV intact and transfusing; LLE coban dsg remains clean, dry and intact; Pin to incision dsg remains intact; pt educated on fall/safety precaution and prevention. VSS; call light within reach. Will continue to closely monitor pt. Pt d/t to void post-op. P. Angelica Pou RN

## 2016-06-12 NOTE — Anesthesia Preprocedure Evaluation (Addendum)
Anesthesia Evaluation  Patient identified by MRN, date of birth, ID band Patient awake    Reviewed: Allergy & Precautions, H&P , Patient's Chart, lab work & pertinent test results, reviewed documented beta blocker date and time   Airway Mallampati: II  TM Distance: >3 FB Neck ROM: full    Dental no notable dental hx.    Pulmonary former smoker,    Pulmonary exam normal breath sounds clear to auscultation       Cardiovascular hypertension,  Rhythm:regular Rate:Normal     Neuro/Psych    GI/Hepatic   Endo/Other    Renal/GU      Musculoskeletal   Abdominal   Peds  Hematology   Anesthesia Other Findings PAF    asthma chronic coumadin;  Echo: EF 60-65%, Gr 1 DD, triv AI, mildly dil RV , PASP 68mmhg.   HTN benign    COPD/Asthma   CAD  Hyperlipidemia   TIA    Arthritis    Neuropathy       Reproductive/Obstetrics                            Anesthesia Physical Anesthesia Plan  ASA: III  Anesthesia Plan: MAC   Post-op Pain Management:  Regional for Post-op pain   Induction: Intravenous  Airway Management Planned: Mask and Natural Airway  Additional Equipment:   Intra-op Plan: Utilization Of Total Body Hypothermia per surgeon request  Post-operative Plan: Extubation in OR  Informed Consent: I have reviewed the patients History and Physical, chart, labs and discussed the procedure including the risks, benefits and alternatives for the proposed anesthesia with the patient or authorized representative who has indicated his/her understanding and acceptance.   Dental Advisory Given  Plan Discussed with: CRNA and Surgeon  Anesthesia Plan Comments: (Discussed ankle block and sedation and potential to need to place airway or ETT if warranted by clinical changes intra-operatively. We will start procedure as MAC/block.)       Anesthesia Quick Evaluation

## 2016-06-13 ENCOUNTER — Encounter (HOSPITAL_COMMUNITY): Payer: Self-pay

## 2016-06-13 DIAGNOSIS — M21612 Bunion of left foot: Secondary | ICD-10-CM | POA: Diagnosis not present

## 2016-06-13 LAB — PROTIME-INR
INR: 1.33
Prothrombin Time: 16.6 seconds — ABNORMAL HIGH (ref 11.4–15.2)

## 2016-06-13 MED ORDER — WARFARIN SODIUM 5 MG PO TABS
5.0000 mg | ORAL_TABLET | Freq: Once | ORAL | Status: AC
  Administered 2016-06-13: 5 mg via ORAL
  Filled 2016-06-13: qty 1

## 2016-06-13 NOTE — Evaluation (Addendum)
Physical Therapy Evaluation Patient Details Name: [name] MRN: [id number] DOB: 1923/10/06 Today's Date: 06/13/2016   History of Present Illness  82 yo female admitted on 06/12/16 for left great toe bunionectomy. PMH significant for CAD, COPD-Asthma, HTN  Clinical Impression  Pt presents POD 1 with the above procedure. Prior to admission, pt was living alone and completely independent with all adls and iadls. Pt is NWB on LLE and will not have 24hr cg assistance available at discharge. It will be beneficial for pt to go to SNF for short term rehab prior to discharge home in order to maximize functional outcomes. Pt was educated on safe stand pivot transfer from bed to chair and with boot on LLE. All education was complete from acute standpoint and additional rehab can be complete at next venue of care.     Follow Up Recommendations      Equipment Recommendations       Recommendations for Other Services       Precautions / Restrictions Precautions Precautions: Fall Required Braces or Orthoses: Other Brace/Splint Other Brace/Splint: Cam Walker Left when transferring Restrictions Weight Bearing Restrictions: Yes LLE Weight Bearing: Non weight bearing      Mobility  Bed Mobility Overal bed mobility: Needs Assistance Bed Mobility: Supine to Sit;Sit to Supine     Supine to sit: Min assist;HOB elevated Sit to supine: Min assist   General bed mobility comments: Min a for safety to assist LLE EOB  Transfers Overall transfer level: Needs assistance Equipment used: Rolling walker (2 wheeled) Transfers: Sit to/from Stand Sit to Stand: Min assist         General transfer comment: Cues for NWB LLE. Pt using BUE on rw for sit>stand after cueing provided. Instructed to have someone steady the rw if she needs to use both hands on it to sit<>stand.   Ambulation/Gait                Stairs            Wheelchair Mobility    Modified Rankin (Stroke  Patients Only)       Balance Overall balance assessment: Needs assistance Sitting-balance support: No upper extremity supported;Feet supported Sitting balance-Leahy Scale: Good Sitting balance - Comments: EOB with no back support   Standing balance support: Bilateral upper extremity supported Standing balance-Leahy Scale: Poor Standing balance comment: relies on BUE support on RW for safety with transfers                             Pertinent Vitals/Pain Pain Assessment: Faces Faces Pain Scale: No hurt    Home Living Family/patient expects to be discharged to:: Skilled nursing facility                 Additional Comments: lives alone in home     Prior Function Level of Independence: Independent with assistive device(s)         Comments: used RW for gait throughout home before surgery     Hand Dominance   Dominant Hand: Right    Extremity/Trunk Assessment   Upper Extremity Assessment: Generalized weakness;Overall WFL for tasks assessed           Lower Extremity Assessment: Defer to PT evaluation         Communication   Communication: No difficulties  Cognition Arousal/Alertness: Awake/alert Behavior During Therapy: WFL for tasks assessed/performed Overall Cognitive Status: Within Functional Limits for tasks assessed  General Comments      Exercises     Assessment/Plan    PT Assessment    PT Problem List            PT Treatment Interventions      PT Goals (Current goals can be found in the Care Plan section)  Acute Rehab PT Goals Patient Stated Goal: rehab then home    Frequency     Barriers to discharge        Co-evaluation               End of Session            Functional Assessment Tool Used: clincial judgement, functional mobility assessment Functional Limitation: Mobility: Walking and moving around Mobility: Walking and Moving Around Current Status [id number]): At least  1 percent but less than 20 percent impaired, limited or restricted Mobility: Walking and Moving Around Goal Status [phone]): At least 1 percent but less than 20 percent impaired, limited or restricted Mobility: Walking and Moving Around Discharge Status [phone]): At least 1 percent but less than 20 percent impaired, limited or restricted    Time:  -      Charges:         PT G Codes:   PT G-Codes **NOT FOR INPATIENT CLASS** Functional Assessment Tool Used: clincial judgement, functional mobility assessment Functional Limitation: Mobility: Walking and moving around Mobility: Walking and Moving Around Current Status [id number]): At least 1 percent but less than 20 percent impaired, limited or restricted Mobility: Walking and Moving Around Goal Status [phone]): At least 1 percent but less than 20 percent impaired, limited or restricted Mobility: Walking and Moving Around Discharge Status [phone]): At least 1 percent but less than 20 percent impaired, limited or restricted    Scheryl Marten PT, DPT  [phone]  06/13/2016, 4:46 PM

## 2016-06-13 NOTE — Progress Notes (Signed)
ANTICOAGULATION CONSULT NOTE - Follow Up Consult  Pharmacy Consult for warfarin Indication: atrial fibrillation  Allergies  Allergen Reactions  . Alphagan [Brimonidine] Other (See Comments)    Burning sensation  . Apraclonidine Other (See Comments)    Pain, brow pain, tender, not able to tolerate  . Biaxin [Clarithromycin]     Unknown, per pt   . Brovana [Arformoterol] Other (See Comments)    Makes my heart race  . Budesonide Other (See Comments)    Makes my heart race  . Cefdinir     "FEEL HORRIBLE"  . Cephalexin Itching  . Cephalexin Nausea Only  . Cifenline Itching  . Clonidine Other (See Comments)    unknown  . Clonidine Derivatives Other (See Comments)    REACTION: unknown  . Dorzolamide Other (See Comments)    Redness  . Iohexol Other (See Comments)     Desc: NOTES FROM PRIOR CT STATES PRE MEDICATION PRIOR TO OMNIPAQUE ENHANCED CT   . Nsaids Other (See Comments)    unknown unknown  . Penicillins Itching, Swelling and Other (See Comments)    Swelling and edema of ears and rash Swelling to ears  Has patient had a PCN reaction causing immediate rash, facial/tongue/throat swelling, SOB or lightheadedness with hypotension: {no Has patient had a PCN reaction causing severe rash involving mucus membranes or skin necrosis: {no Has patient had a PCN reaction that required hospitalization no Has patient had a PCN reaction occurring within the last 10 years: no If all of the above answers are "NO", then may proceed with Cephalosporin use.  . Sulfamethoxazole Other (See Comments)    unknown unknown  . Sulfonamide Derivatives Itching  . Tetracycline Itching and Other (See Comments)    unknown  . Timolol Other (See Comments)    Eye red and irritated Eye red and irritated  . Vancomycin Other (See Comments)    unknown Turn red  . Zetia [Ezetimibe] Other (See Comments)    unknown unknown  . Cardizem [Diltiazem Hcl] Rash    Patient Measurements: Weight: 138 lb (62.6  kg)   Vital Signs: Temp: 98.8 F (37.1 C) (12/09 0600) Temp Source: Oral (12/09 0600) BP: 106/37 (12/09 0600) Pulse Rate: 71 (12/09 0600)  Labs:  Recent Labs  06/12/16 1128 06/13/16 0344  HGB 12.6  --   HCT 36.8  --   PLT 163  --   LABPROT 16.1* 16.6*  INR 1.28 1.33  CREATININE 0.87  --     Estimated Creatinine Clearance: 41.8 mL/min (by C-G formula based on SCr of 0.87 mg/dL).   [name]: 82 y/o female admitted 06/12/2016 with bunion and claw toe deformity of left foot s/p chevron and aiken left great toe + weil osteotomy of L foot on 12/8. Pharmacy consulted to manage warfarin inpatient for stroke prevention secondary to Afib.  INR is subtherapeutic at 1.33 today. No bleeding noted, pre-op CBC normal.  PTA: 3.75 mg daily except 2.5 mg M/F  Goal of Therapy:  INR 2-3 Monitor platelets by anticoagulation protocol: Yes   Plan:  Warfarin 5 mg PO tonight Daily INR Monitor for s/sx of bleeding   Renold Genta, PharmD, BCPS Clinical Pharmacist Phone for today - [place] - [phone] 06/13/2016 8:40 AM

## 2016-06-13 NOTE — Progress Notes (Signed)
Patient is stable today status post bunionectomy. Her dressing is clean dry and intact. We are waiting skilled nursing facility placement. Appreciate assistance from Education officer, museum.

## 2016-06-13 NOTE — Evaluation (Signed)
Occupational Therapy Evaluation Patient Details Name: ANAMTA ALLEGRA MRN: [id number] DOB: [birth year] Today's Date: 06/13/2016    History of Present Illness 82 yo female admitted on 06/12/16 for left great toe bunionectomy. PMH significant for CAD, COPD-Asthma, HTN   Clinical Impression   Pt admitted with the above diagnoses and presents with below problem list. Pt will benefit from continued acute OT to address the below listed deficits and maximize independence with basic ADLs prior to d/c to venue below. PTA pt was mod I with ADLs. Pt currently min guard to mod A for LB ADLs and functional transfers.       Follow Up Recommendations  SNF    Equipment Recommendations  Other (comment) (defer to next venue)    Recommendations for Other Services       Precautions / Restrictions Precautions Precautions: Fall Required Braces or Orthoses: Other Brace/Splint Other Brace/Splint: Cam Walker Left when transferring Restrictions Weight Bearing Restrictions: Yes LLE Weight Bearing: Non weight bearing      Mobility Bed Mobility Overal bed mobility: Needs Assistance Bed Mobility: Supine to Sit;Sit to Supine     Supine to sit: Min assist;HOB elevated Sit to supine: Min assist   General bed mobility comments: Min a for safety to assist LLE EOB  Transfers Overall transfer level: Needs assistance Equipment used: Rolling walker (2 wheeled) Transfers: Sit to/from Stand Sit to Stand: Min assist         General transfer comment: Cues for NWB LLE. Pt using BUE on rw for sit>stand after cueing provided. Instructed to have someone steady the rw if she needs to use both hands on it to sit<>stand.     Balance Overall balance assessment: Needs assistance Sitting-balance support: No upper extremity supported;Feet supported Sitting balance-Leahy Scale: Good Sitting balance - Comments: EOB with no back support   Standing balance support: Bilateral upper extremity  supported Standing balance-Leahy Scale: Poor Standing balance comment: relies on BUE support on RW for safety with transfers                            ADL Overall ADL's : Needs assistance/impaired Eating/Feeding: Set up;Sitting   Grooming: Set up;Sitting   Upper Body Bathing: Set up;Sitting   Lower Body Bathing: Moderate assistance;Sit to/from stand   Upper Body Dressing : Set up;Sitting   Lower Body Dressing: Moderate assistance;Sit to/from stand   Toilet Transfer: Minimal assistance;Stand-pivot;BSC;RW   Toileting- Clothing Manipulation and Hygiene: Minimal assistance;Sit to/from stand         General ADL Comments: Pt completed bed mobility and sit<>stand from EOB. Declined transfer to chair. ADL education provided to pt and great-grandaughter.      Vision     Perception     Praxis      Pertinent Vitals/Pain Pain Assessment: Faces Faces Pain Scale: No hurt     Hand Dominance Right   Extremity/Trunk Assessment Upper Extremity Assessment Upper Extremity Assessment: Generalized weakness;Overall [facility] for tasks assessed   Lower Extremity Assessment Lower Extremity Assessment: Defer to PT evaluation       Communication Communication Communication: No difficulties   Cognition Arousal/Alertness: Awake/alert Behavior During Therapy: WFL for tasks assessed/performed Overall Cognitive Status: Within Functional Limits for tasks assessed                     General Comments       Exercises       Shoulder Instructions  Home Living Family/patient expects to be discharged to:: Skilled nursing facility                                 Additional Comments: lives alone in home       Prior Functioning/Environment Level of Independence: Independent with assistive device(s)        Comments: used RW for gait throughout home before surgery        OT Problem List: Impaired balance (sitting and/or standing);Decreased  knowledge of use of DME or AE;Decreased knowledge of precautions;Pain   OT Treatment/Interventions: Self-care/ADL training;DME and/or AE instruction;Therapeutic activities;Patient/family education;Balance training    OT Goals(Current goals can be found in the care plan section) Acute Rehab OT Goals Patient Stated Goal: rehab then home OT Goal Formulation: With patient/family Time For Goal Achievement: 06/20/16 Potential to Achieve Goals: Good ADL Goals Pt Will Perform Lower Body Bathing: with min guard assist;sit to/from stand;with adaptive equipment Pt Will Perform Lower Body Dressing: with min guard assist;with adaptive equipment;sit to/from stand Pt Will Transfer to Toilet: with min guard assist;stand pivot transfer;bedside commode (rw) Pt Will Perform Toileting - Clothing Manipulation and hygiene: with min guard assist;sitting/lateral leans;sit to/from stand  OT Frequency: Min 2X/week   Barriers to D/C:            Co-evaluation              End of Session Equipment Utilized During Treatment: Gait belt;Rolling walker  Activity Tolerance: Patient tolerated treatment well Patient left: in bed;with call bell/phone within reach;with family/visitor present   Time: 1417-1430 OT Time Calculation (min): 13 min Charges:  OT General Charges $OT Visit: 1 Procedure OT Evaluation $OT Eval Low Complexity: 1 Procedure G-Codes: OT G-codes **NOT FOR INPATIENT CLASS** Functional Assessment Tool Used: clinical judgement Functional Limitation: Self care Self Care Current Status [id number]): At least 20 percent but less than 40 percent impaired, limited or restricted Self Care Goal Status [id number]): At least 1 percent but less than 20 percent impaired, limited or restricted  [name] 06/13/2016, 3:01 PM

## 2016-06-13 NOTE — NC FL2 (Signed)
[place] LEVEL OF CARE SCREENING TOOL     IDENTIFICATION  Patient Name: [name] Birthdate: [birth year] Sex: female Admission Date (Current Location): 06/12/2016  [facility] and [place] Number:  Herbalist and Address:  The . [facility], [place] [address] [place], [place] [address]      Provider Number: [id number]  Attending Physician Name and Address:  Newt Minion, MD  Relative Name and Phone Number:  Stanton Kidney [phone]    Current Level of Care: Hospital Recommended Level of Care: [place] Prior Approval Number:    Date Approved/Denied:   PASRR Number:   [id number]   Discharge Plan: SNF    Current Diagnoses: Patient Active Problem List   Diagnosis Date Noted  . Bunion of great toe of right foot 06/12/2016  . Acquired hammer toe of left foot   . Encounter for therapeutic drug monitoring 06/11/2014  . Benign essential HTN 03/22/2014  . Wrist laceration 03/01/2014  . PAF (paroxysmal atrial fibrillation) ([place])   . Hyperlipidemia   . Chronic anticoagulation 09/24/2012  . CAD (coronary artery disease) 09/24/2012  . WEIGHT LOSS 08/23/2009  . CHANGE IN BOWELS 08/23/2009  . COLONIC POLYPS, ADENOMATOUS, HX OF 08/23/2009  . Allergic rhinitis due to pollen 11/13/2008  . SINUSITIS, CHRONIC 07/13/2007  . Chronic obstructive airway disease with asthma ([place]) 06/15/2007    Orientation RESPIRATION BLADDER Height & Weight     Self, Time, Situation, Place  Normal Continent Weight: 62.6 kg (138 lb) Height:     BEHAVIORAL SYMPTOMS/MOOD NEUROLOGICAL BOWEL NUTRITION STATUS      Continent Diet (Please see DC Summary)  AMBULATORY STATUS COMMUNICATION OF NEEDS Skin   Limited Assist Verbally Surgical wounds (Closed incision on foot)                       Personal Care Assistance Level of Assistance  Bathing, Feeding, Dressing Bathing Assistance: Limited assistance Feeding assistance: Independent Dressing  Assistance: Limited assistance     Functional Limitations Info             SPECIAL CARE FACTORS FREQUENCY  PT (By licensed PT)     PT Frequency: 5x/week              Contractures      Additional Factors Info  Code Status, Allergies Code Status Info: Full Allergies Info: Alphagan Brimonidine, Apraclonidine, Biaxin Clarithromycin, Brovana Arformoterol, Budesonide, Cefdinir, Cephalexin, Cephalexin, Cifenline, Clonidine, Clonidine Derivatives, Dorzolamide, Iohexol, Nsaids, Penicillins, Sulfamethoxazole, Sulfonamide Derivatives, Tetracycline, Timolol, Vancomycin, Zetia Ezetimibe, Cardizem Diltiazem Hcl           Current Medications (06/13/2016):  This is the current hospital active medication list Current Facility-Administered Medications  Medication Dose Route Frequency Provider Last Rate Last Dose  . 0.9 %  sodium chloride infusion   Intravenous Continuous Newt Minion, MD 10 mL/hr at 06/12/16 2214    . acetaminophen (TYLENOL) tablet 650 mg  650 mg Oral Q6H PRN Newt Minion, MD       Or  . acetaminophen (TYLENOL) suppository 650 mg  650 mg Rectal Q6H PRN Newt Minion, MD      . albuterol (PROVENTIL) (2.5 MG/3ML) 0.083% nebulizer solution 2.5 mg  2.5 mg Inhalation Q4H PRN Newt Minion, MD      . ALPRAZolam Duanne Moron) tablet 0.25 mg  0.25 mg Oral QHS PRN Newt Minion, MD   0.25 mg at 06/12/16 2209  . amLODipine (NORVASC) tablet 10  mg  10 mg Oral Daily Newt Minion, MD   10 mg at 06/13/16 [id number]   And  . irbesartan (AVAPRO) tablet 300 mg  300 mg Oral Daily Newt Minion, MD   300 mg at 06/13/16 [id number]  . fluticasone furoate-vilanterol (BREO ELLIPTA) 100-25 MCG/INH 1 puff  1 puff Inhalation Daily Newt Minion, MD   1 puff at 06/13/16 0905  . furosemide (LASIX) tablet 40 mg  40 mg Oral Daily PRN Newt Minion, MD   40 mg at 06/12/16 1720  . HYDROmorphone (DILAUDID) injection 1 mg  1 mg Intravenous Q2H PRN Newt Minion, MD      . latanoprost (XALATAN) 0.005 % ophthalmic solution  1 drop  1 drop Both Eyes QHS Newt Minion, MD   1 drop at 06/12/16 2209  . methocarbamol (ROBAXIN) tablet 500 mg  500 mg Oral Q6H PRN Newt Minion, MD   500 mg at 06/13/16 [id number]   Or  . methocarbamol (ROBAXIN) 500 mg in dextrose 5 % 50 mL IVPB  500 mg Intravenous Q6H PRN Newt Minion, MD      . metoCLOPramide (REGLAN) tablet 5-10 mg  5-10 mg Oral Q8H PRN Newt Minion, MD       Or  . metoCLOPramide (REGLAN) injection 5-10 mg  5-10 mg Intravenous Q8H PRN Newt Minion, MD      . ondansetron [facility]) tablet 4 mg  4 mg Oral Q6H PRN Newt Minion, MD       Or  . ondansetron [facility]) injection 4 mg  4 mg Intravenous Q6H PRN Newt Minion, MD      . oxyCODONE (Oxy IR/ROXICODONE) immediate release tablet 5-10 mg  5-10 mg Oral Q3H PRN Newt Minion, MD      . pilocarpine (PILOCAR) 1 % ophthalmic solution 1 drop  1 drop Left Eye TID Newt Minion, MD   1 drop at 06/12/16 2116  . pravastatin (PRAVACHOL) tablet 20 mg  20 mg Oral QPM Newt Minion, MD   20 mg at 06/12/16 1720  . warfarin (COUMADIN) tablet 5 mg  5 mg Oral ONCE-1800 [place], [facility]      . Warfarin - Pharmacist Dosing Inpatient   Does not apply q1800 Newt Minion, MD         Discharge Medications: Please see discharge summary for a list of discharge medications.  Relevant Imaging Results:  Relevant Lab Results:   Additional Information SSN: [id number]   Will need LOG until insurance approval  Benard Halsted, LCSWA

## 2016-06-13 NOTE — Discharge Instructions (Signed)

## 2016-06-14 DIAGNOSIS — M21612 Bunion of left foot: Secondary | ICD-10-CM | POA: Diagnosis not present

## 2016-06-14 LAB — PROTIME-INR
INR: 1.51
Prothrombin Time: 18.4 seconds — ABNORMAL HIGH (ref 11.4–15.2)

## 2016-06-14 MED ORDER — VITAMIN D 1000 UNITS PO TABS
1000.0000 [IU] | ORAL_TABLET | Freq: Every day | ORAL | Status: DC
  Administered 2016-06-14 – 2016-06-15 (×2): 1000 [IU] via ORAL
  Filled 2016-06-14 (×2): qty 1

## 2016-06-14 MED ORDER — FLUTICASONE PROPIONATE 50 MCG/ACT NA SUSP
1.0000 | Freq: Every day | NASAL | Status: DC
  Administered 2016-06-14 – 2016-06-15 (×2): 1 via NASAL
  Filled 2016-06-14: qty 16

## 2016-06-14 MED ORDER — WARFARIN SODIUM 4 MG PO TABS
4.0000 mg | ORAL_TABLET | Freq: Once | ORAL | Status: AC
  Administered 2016-06-14: 4 mg via ORAL
  Filled 2016-06-14: qty 1

## 2016-06-14 NOTE — Clinical Social Work Note (Signed)
Clinical Social Work Assessment  Patient Details  Name: [name] MRN: [id number] Date of Birth: [birth year]  Date of referral:  06/13/16               Reason for consult:  Discharge Planning, Facility Placement                Permission sought to share information with:  Facility Sport and exercise psychologist, Family Supports Permission granted to share information::  Yes, Verbal Permission Granted  Name::     [name]  Agency::  SNFs  Relationship::     Contact Information:     Housing/Transportation Living arrangements for the past 2 months:  Single Family Home Source of Information:  Patient Patient Interpreter Needed:  None Criminal Activity/Legal Involvement Pertinent to Current Situation/Hospitalization:  No - Comment as needed Significant Relationships:  Adult Children Lives with:  Self Do you feel safe going back to the place where you live?  Yes Need for family participation in patient care:  No (Coment)  Care giving concerns:  The patient was originally planning for home but now states that she cannot go home and needs SNF.   Social Worker assessment / plan:  CSW met with patient and granddaughter at bedside to complete assessment. CSW noted that the patient is medically stable for discharge. CSW explained this to the patient and granddaughter, both adamantly refuse discharge until Monday stating that they were told they would stay until Monday. The patient states that she wants to go to [facility]. CSW explained that this will depend on whether or not [employer] approves the patient. CSW explained that [employer] can take 24-72hrs to come in, and that her insurance is not open on the weekends. CSW explained that the patient will have to pay privately if an authorization is not obtained on Monday. The patient stated "I have spoken with [employer] and they said they will cover 20 days." CSW explained that [facility] will cover SNF but only if an authorization is obtained,  and that the insurance company can in fact deny her if they do not feel that SNF is appropriate. CSW will followup with available bed offers.   Employment status:  Retired Nurse, adult PT Recommendations:  [place] / Referral to community resources:  [place]  Patient/Family's Response to care:  The patient and family express some frustration with the discharge process. They feel they should be able to remain in the hospital until an authorization is obtained from [place], despite [place] explaining multiple times that a patient must move to the next level of care once medically stable.   Patient/Family's Understanding of and Emotional Response to Diagnosis, Current Treatment, and Prognosis:  The patient and family appear to have a good understanding of the reason for the patient's admission and the patient's post DC plan.   Emotional Assessment Appearance:  Appears stated age Attitude/Demeanor/Rapport:  Other (Patient is appropriate) Affect (typically observed):  Accepting, Appropriate, Calm, Pleasant Orientation:  Oriented to Self, Oriented to Place, Oriented to  Time, Oriented to Situation Alcohol / Substance use:  Not Applicable Psych involvement (Current and /or in the community):  No (Comment)  Discharge Needs  Concerns to be addressed:  Discharge Planning Concerns, Care Coordination Readmission within the last 30 days:  No Current discharge risk:  Physical Impairment Barriers to Discharge:  Continued Medical Work up   Fredderick Phenix B, LCSW 06/14/2016, 10:35 AM

## 2016-06-14 NOTE — Progress Notes (Signed)
ANTICOAGULATION CONSULT NOTE - Follow Up Consult  Pharmacy Consult for warfarin Indication: atrial fibrillation  Allergies  Allergen Reactions  . Alphagan [Brimonidine] Other (See Comments)    Burning sensation  . Apraclonidine Other (See Comments)    Pain, brow pain, tender, not able to tolerate  . Biaxin [Clarithromycin]     Unknown, per pt   . Brovana [Arformoterol] Other (See Comments)    Makes my heart race  . Budesonide Other (See Comments)    Makes my heart race  . Cefdinir     "FEEL HORRIBLE"  . Cephalexin Itching  . Cephalexin Nausea Only  . Cifenline Itching  . Clonidine Other (See Comments)    unknown  . Clonidine Derivatives Other (See Comments)    REACTION: unknown  . Dorzolamide Other (See Comments)    Redness  . Iohexol Other (See Comments)     Desc: NOTES FROM PRIOR CT STATES PRE MEDICATION PRIOR TO OMNIPAQUE ENHANCED CT   . Nsaids Other (See Comments)    unknown unknown  . Penicillins Itching, Swelling and Other (See Comments)    Swelling and edema of ears and rash Swelling to ears  Has patient had a PCN reaction causing immediate rash, facial/tongue/throat swelling, SOB or lightheadedness with hypotension: {no Has patient had a PCN reaction causing severe rash involving mucus membranes or skin necrosis: {no Has patient had a PCN reaction that required hospitalization no Has patient had a PCN reaction occurring within the last 10 years: no If all of the above answers are "NO", then may proceed with Cephalosporin use.  . Sulfamethoxazole Other (See Comments)    unknown unknown  . Sulfonamide Derivatives Itching  . Tetracycline Itching and Other (See Comments)    unknown  . Timolol Other (See Comments)    Eye red and irritated Eye red and irritated  . Vancomycin Other (See Comments)    unknown Turn red  . Zetia [Ezetimibe] Other (See Comments)    unknown unknown  . Cardizem [Diltiazem Hcl] Rash    Patient Measurements: Height: 5\' 7"  (170.2  cm) Weight: 137 lb 12.6 oz (62.5 kg) IBW/kg (Calculated) : 61.6   Vital Signs: Temp: 97.8 F (36.6 C) (12/10 0627) Temp Source: Oral (12/10 0627) BP: 123/44 (12/10 0627) Pulse Rate: 61 (12/10 0627)  Labs:  Recent Labs  06/12/16 1128 06/13/16 0344 06/14/16 0316  HGB 12.6  --   --   HCT 36.8  --   --   PLT 163  --   --   LABPROT 16.1* 16.6* 18.4*  INR 1.28 1.33 1.51  CREATININE 0.87  --   --     Estimated Creatinine Clearance: 41.8 mL/min (by C-G formula based on SCr of 0.87 mg/dL).   Assessment: 82 y/o female admitted 06/12/2016 with bunion and claw toe deformity of left foot s/p chevron and aiken left great toe + weil osteotomy of L foot on 12/8. Pharmacy consulted to manage warfarin inpatient for stroke prevention secondary to Afib.  INR is subtherapeutic at 1.51 today but is trending up. No bleeding noted, pre-op CBC normal.  PTA: 3.75 mg daily except 2.5 mg M/F  Goal of Therapy:  INR 2-3 Monitor platelets by anticoagulation protocol: Yes   Plan:  Warfarin 5 mg PO tonight Daily INR, CBC in am Monitor for s/sx of bleeding   Renold Genta, PharmD, BCPS Clinical Pharmacist Phone for today - [facility] - [phone] 06/14/2016 7:55 AM

## 2016-06-14 NOTE — Progress Notes (Signed)
Physical Therapy Treatment Patient Details Name: [name] MRN: [id number] DOB: March 18, 1924 Today's Date: 06/14/2016    History of Present Illness 82 yo female admitted on 06/12/16 for left great toe bunionectomy. PMH significant for CAD, COPD-Asthma, HTN    PT Comments    Assisted pt to [facility] and then to recliner with Min a for safety when transferring with Rw. Pt requires cues to maintain NWB on LLE during all transfer activities with boot in place. Improved bed mobs noted this session. Assisted pt with wash cloths to wash her face and hands while sitting up in the chair.    Follow Up Recommendations  SNF     Equipment Recommendations  None recommended by PT;Other (comment) (defer to next venue of care)    Recommendations for Other Services       Precautions / Restrictions Precautions Precautions: Fall Restrictions Weight Bearing Restrictions: Yes LLE Weight Bearing: Non weight bearing    Mobility  Bed Mobility Overal bed mobility: Needs Assistance Bed Mobility: Supine to Sit     Supine to sit: Min guard     General bed mobility comments: Min guard for safety   Transfers Overall transfer level: Needs assistance Equipment used: Rolling walker (2 wheeled) Transfers: Sit to/from Stand Sit to Stand: Min guard Stand pivot transfers: Min assist       General transfer comment: Cues for NWB LLE. Pt using BUE on rw for sit>stand after cueing provided. Instructed to have someone steady the rw if she needs to use both hands on it to sit<>stand.   Ambulation/Gait                 Stairs            Wheelchair Mobility    Modified Rankin (Stroke Patients Only)       Balance                                    Cognition Arousal/Alertness: Awake/alert Behavior During Therapy: WFL for tasks assessed/performed Overall Cognitive Status: Within Functional Limits for tasks assessed                      Exercises General  Exercises - Lower Extremity Ankle Circles/Pumps: AROM;Both;20 reps;Seated Long Arc Quad: AROM;Both;10 reps;Supine Heel Slides: AROM;Both;10 reps;Supine Hip ABduction/ADduction: AROM;Both;Supine    General Comments        Pertinent Vitals/Pain Pain Assessment: 0-10 Pain Score: 0-No pain    Home Living                      Prior Function            PT Goals (current goals can now be found in the care plan section) Acute Rehab PT Goals Patient Stated Goal: rehab then home Progress towards PT goals: Progressing toward goals    Frequency    Min 3X/week      PT Plan Current plan remains appropriate    Co-evaluation             End of Session Equipment Utilized During Treatment: Gait belt Activity Tolerance: Patient tolerated treatment well Patient left: in chair;with call bell/phone within reach     Time: 1120-1143 PT Time Calculation (min) (ACUTE ONLY): 23 min  Charges:  $Therapeutic Exercise: 8-22 mins $Therapeutic Activity: 8-22 mins  G Codes:      Scheryl Marten PT, DPT  [phone]  06/14/2016, 12:41 PM

## 2016-06-15 ENCOUNTER — Other Ambulatory Visit (INDEPENDENT_AMBULATORY_CARE_PROVIDER_SITE_OTHER): Payer: Self-pay

## 2016-06-15 ENCOUNTER — Encounter (HOSPITAL_COMMUNITY): Payer: Self-pay | Admitting: Orthopedic Surgery

## 2016-06-15 DIAGNOSIS — M21612 Bunion of left foot: Secondary | ICD-10-CM | POA: Diagnosis not present

## 2016-06-15 LAB — PROTIME-INR
INR: 1.45
PROTHROMBIN TIME: 17.8 s — AB (ref 11.4–15.2)

## 2016-06-15 LAB — CBC
HCT: 31.2 % — ABNORMAL LOW (ref 36.0–46.0)
Hemoglobin: 10.7 g/dL — ABNORMAL LOW (ref 12.0–15.0)
MCH: 31.1 pg (ref 26.0–34.0)
MCHC: 34.3 g/dL (ref 30.0–36.0)
MCV: 90.7 fL (ref 78.0–100.0)
PLATELETS: 157 10*3/uL (ref 150–400)
RBC: 3.44 MIL/uL — AB (ref 3.87–5.11)
RDW: 14.6 % (ref 11.5–15.5)
WBC: 8.7 10*3/uL (ref 4.0–10.5)

## 2016-06-15 MED ORDER — WARFARIN SODIUM 5 MG PO TABS
5.0000 mg | ORAL_TABLET | Freq: Once | ORAL | Status: AC
  Administered 2016-06-15: 5 mg via ORAL
  Filled 2016-06-15: qty 1

## 2016-06-15 MED ORDER — ALPRAZOLAM 0.25 MG PO TABS: 0.2500 mg | ORAL_TABLET | Freq: Every evening | ORAL | 0 refills | Status: DC | PRN

## 2016-06-15 NOTE — Progress Notes (Signed)
Reviewed discharge instructions with the patient.  Answered all of her questions.  Waiting on [facility] to pick her up.  Called report to [facility].

## 2016-06-15 NOTE — Clinical Social Work Placement (Signed)
   CLINICAL SOCIAL WORK PLACEMENT  NOTE  Date:  06/15/2016  Patient Details  Name: [name] MRN: [id number] Date of Birth: Jun 21, 1924  Clinical Social Work is seeking post-discharge placement for this patient at the [place] level of care (*CSW will initial, date and re-position this form in  chart as items are completed):      Patient/family provided with [place] Work Department's list of facilities offering this level of care within the geographic area requested by the patient (or if unable, by the patient's family).  Yes   Patient/family informed of their freedom to choose among providers that offer the needed level of care, that participate in Medicare, Medicaid or managed care program needed by the patient, have an available bed and are willing to accept the patient.      Patient/family informed of [facility]'s ownership interest in [facility] and [facility], as well as of the fact that they are under no obligation to receive care at these facilities.  PASRR submitted to EDS on       PASRR number received on       Existing PASRR number confirmed on 06/13/16     FL2 transmitted to all facilities in geographic area requested by pt/family on 06/13/16     FL2 transmitted to all facilities within larger geographic area on       Patient informed that his/her managed care company has contracts with or will negotiate with certain facilities, including the following:        Yes   Patient/family informed of bed offers received.  Patient chooses bed at [facility]     Physician recommends and patient chooses bed at      Patient to be transferred to [facility] on 06/15/16.  Patient to be transferred to facility by [facility]     Patient family notified on 06/15/16 of transfer.  Name of family member notified:  Patient     PHYSICIAN Please prepare prescriptions     Additional Comment:     _______________________________________________ Alla German, LCSW 06/15/2016, 11:24 AM

## 2016-06-15 NOTE — Progress Notes (Signed)
ANTICOAGULATION CONSULT NOTE - Follow Up Consult  Pharmacy Consult for warfarin Indication: atrial fibrillation  Allergies  Allergen Reactions  . Alphagan [Brimonidine] Other (See Comments)    Burning sensation  . Apraclonidine Other (See Comments)    Pain, brow pain, tender, not able to tolerate  . Biaxin [Clarithromycin]     Unknown, per pt   . Brovana [Arformoterol] Other (See Comments)    Makes my heart race  . Budesonide Other (See Comments)    Makes my heart race  . Cefdinir     "FEEL HORRIBLE"  . Cephalexin Itching  . Cephalexin Nausea Only  . Cifenline Itching  . Clonidine Other (See Comments)    unknown  . Clonidine Derivatives Other (See Comments)    REACTION: unknown  . Dorzolamide Other (See Comments)    Redness  . Iohexol Other (See Comments)     Desc: NOTES FROM PRIOR CT STATES PRE MEDICATION PRIOR TO OMNIPAQUE ENHANCED CT   . Nsaids Other (See Comments)    unknown unknown  . Penicillins Itching, Swelling and Other (See Comments)    Swelling and edema of ears and rash Swelling to ears  Has patient had a PCN reaction causing immediate rash, facial/tongue/throat swelling, SOB or lightheadedness with hypotension: {no Has patient had a PCN reaction causing severe rash involving mucus membranes or skin necrosis: {no Has patient had a PCN reaction that required hospitalization no Has patient had a PCN reaction occurring within the last 10 years: no If all of the above answers are "NO", then may proceed with Cephalosporin use.  . Sulfamethoxazole Other (See Comments)    unknown unknown  . Sulfonamide Derivatives Itching  . Tetracycline Itching and Other (See Comments)    unknown  . Timolol Other (See Comments)    Eye red and irritated Eye red and irritated  . Vancomycin Other (See Comments)    unknown Turn red  . Zetia [Ezetimibe] Other (See Comments)    unknown unknown  . Cardizem [Diltiazem Hcl] Rash    Patient Measurements: Height: 5\' 7"  (170.2  cm) Weight: 137 lb 12.6 oz (62.5 kg) IBW/kg (Calculated) : 61.6   Vital Signs: Temp: 98.8 F (37.1 C) (12/11 0559) Temp Source: Oral (12/11 0559) BP: 121/50 (12/11 0559) Pulse Rate: 65 (12/11 0559)  Labs:  Recent Labs  06/12/16 1128 06/13/16 0344 06/14/16 0316 06/15/16 0521  HGB 12.6  --   --  10.7*  HCT 36.8  --   --  31.2*  PLT 163  --   --  157  LABPROT 16.1* 16.6* 18.4* 17.8*  INR 1.28 1.33 1.51 1.45  CREATININE 0.87  --   --   --     Estimated Creatinine Clearance: 41.8 mL/min (by C-G formula based on SCr of 0.87 mg/dL).   Assessment: 82 y/o female admitted 06/12/2016 with bunion and claw toe deformity of left foot s/p chevron and aiken left great toe + weil osteotomy on 06/12/16. Pharmacy consulted to manage warfarin inpatient for stroke prevention secondary to Afib.  INR is subtherapeutic and trending down slightly.  No bleeding reported.  PTA: 3.75 mg daily except 2.5 mg M/F  Goal of Therapy:  INR 2-3    Plan:  - Coumadin 5mg  PO today - Daily PT / INR    Dadrian Ballantine D. Mina Marble, PharmD, BCPS Pager:  [phone] 06/15/2016, 7:37 AM

## 2016-06-15 NOTE — Clinical Social Work Note (Signed)
Clinical Social Worker facilitated patient discharge including contacting patient family and facility to confirm patient discharge plans.  Clinical information faxed to facility and family agreeable with plan.  CSW arranged ambulance transport via [facility] to [facility].  RN to call [phone] for report prior to discharge.  Clinical Social Worker will sign off for now as social work intervention is no longer needed. Please consult us again if new need arises.  Shariece Viveiros Mayton, LCSWA [phone]   

## 2016-06-15 NOTE — Progress Notes (Signed)
Patient ID: [name], female   DOB: October 02, 1923, 82 y.o.   MRN: [id number] Patient without complaints of pain. Dressing is clean and dry. Physical therapy and occupational therapy have recommended skilled nursing placement. Orders written for discharge to skilled nursing. FL 2 is completed.

## 2016-06-15 NOTE — Discharge Summary (Signed)
Discharge Diagnoses:  Active Problems:   Bunion of great toe of right foot   Acquired hammer toe of left foot   Surgeries: Procedure(s): Left Chevron and Aiken Left Great Toe, Weil Osteotomy 2nd and 3rd Metatarsal on 06/12/2016    Consultants:  none  Discharged Condition: Improved  Hospital Course: [name] is an 82 y.o. female who was admitted 06/12/2016 with a chief complaint of claw toe and bunion deformity with ulceration on the right foot. Patient had failed conservative treatment, with a final diagnosis of Bunion and Claw Toes Left 2nd and 3rd.  Patient was brought to the operating room on 06/12/2016 and underwent Procedure(s): Left Chevron and [facility] Left Great Toe, Weil Osteotomy 2nd and 3rd Metatarsal.    Patient was given perioperative antibiotics: Anti-infectives    Start     Dose/Rate Route Frequency Ordered Stop   06/12/16 1930  clindamycin (CLEOCIN) IVPB 600 mg     600 mg 100 mL/hr over 30 Minutes Intravenous Every 6 hours 06/12/16 1519 06/13/16 0723   06/12/16 1105  clindamycin (CLEOCIN) IVPB 900 mg     900 mg 100 mL/hr over 30 Minutes Intravenous On call to O.R. 06/12/16 1105 06/12/16 1323    .  Patient was given sequential compression devices, early ambulation, and aspirin for DVT prophylaxis.  Recent vital signs: Patient Vitals for the past 24 hrs:  BP Temp Temp src Pulse Resp SpO2  06/15/16 0559 (!) 121/50 98.8 F (37.1 C) Oral 65 16 96 %  06/14/16 2056 (!) 140/54 99.1 F (37.3 C) Oral 75 16 99 %  06/14/16 1455 (!) 133/42 98.1 F (36.7 C) Oral 74 - 98 %  .  Recent laboratory studies: No results found.  Discharge Medications:     Medication List    STOP taking these medications   mupirocin ointment 2 % Commonly known as:  BACTROBAN     TAKE these medications   acetaminophen 325 MG tablet Commonly known as:  TYLENOL Take 325 mg by mouth every 6 (six) hours as needed for moderate pain.   albuterol 108 (90 Base) MCG/ACT inhaler Commonly  known as:  PROAIR HFA Inhale 1-2 puffs into the lungs every 4 (four) hours as needed for wheezing or shortness of breath.   ALPRAZolam 0.25 MG tablet Commonly known as:  XANAX Take 0.25 mg by mouth at bedtime as needed for anxiety.   amLODipine-valsartan 10-320 MG tablet Commonly known as:  EXFORGE TAKE ONE TABLET BY MOUTH ONCE DAILY   cetirizine 10 MG tablet Commonly known as:  ZYRTEC Take 10 mg by mouth daily as needed for allergies.   fluticasone 50 MCG/ACT nasal spray Commonly known as:  FLONASE Place 1 spray into both nostrils daily as needed for allergies or rhinitis.   fluticasone furoate-vilanterol 100-25 MCG/INH Aepb Commonly known as:  BREO ELLIPTA Inhale 1 puff into the lungs daily.   furosemide 40 MG tablet Commonly known as:  LASIX Take 1 tablet (40 mg total) by mouth daily as needed for fluid.   latanoprost 0.005 % ophthalmic solution Commonly known as:  XALATAN Place 1 drop into both eyes at bedtime.   MULTIVITAMIN & MINERAL PO Take 1 tablet by mouth daily.   pilocarpine 1 % ophthalmic solution Commonly known as:  PILOCAR Place 1 drop into the left eye 3 (three) times daily.   pravastatin 20 MG tablet Commonly known as:  PRAVACHOL Take 1 tablet (20 mg total) by mouth every evening.   Vitamin D 2000 units tablet  Take 2,000 Units by mouth daily.   warfarin 2.5 MG tablet Commonly known as:  COUMADIN Take 2.5-3.75 mg by mouth daily. Take 3.75 mg (1.5 tablets) on Sun, Tues, Weds, Thurs, Sat & Take 2.5 mg (1 tablet) on Mon & Fri   XOLAIR 150 MG injection Generic drug:  omalizumab Inject 375 mg into the skin every 14 (fourteen) days.       Diagnostic Studies: No results found.  Patient benefited maximally from their hospital stay and there were no complications.     Disposition: 01-Home or Self Care Discharge Instructions    Call MD / Call 911    Complete by:  As directed    If you experience chest pain or shortness of breath, CALL 911 and be  transported to the hospital emergency room.  If you develope a fever above 101 F, pus (white drainage) or increased drainage or redness at the wound, or calf pain, call your surgeon's office.   Constipation Prevention    Complete by:  As directed    Drink plenty of fluids.  Prune juice may be helpful.  You may use a stool softener, such as Colace (over the counter) 100 mg twice a day.  Use MiraLax (over the counter) for constipation as needed.   Diet - low sodium heart healthy    Complete by:  As directed    Increase activity slowly as tolerated    Complete by:  As directed    Non weight bearing    Complete by:  As directed    Laterality:  right   Extremity:  Lower     Follow-up Information    Newt Minion, MD In 2 weeks.   Specialty:  Orthopedic Surgery Contact information: [place] [address] [phone]            Signed: Newt Minion 06/15/2016, 6:54 AM

## 2016-06-15 NOTE — Clinical Social Work Note (Signed)
CSW spoke with pt about bed offers. CSW explained that per Ivin Booty at [facility] she is not going to start authorization because the pt would have to private pay and the facility has gotten in trouble recently for taking a pt before authorization was received. Pt is agreeable to placement at [facility]. CSW spoke with Narda Rutherford at [facility] and they will provide pt with bed and they have started authorization at this time. Narda Rutherford is faxing paperwork for pt to sign at this time.  [address] [place]

## 2016-06-15 NOTE — Anesthesia Postprocedure Evaluation (Signed)
Anesthesia Post Note  Patient: GENIECE CUPPETT  Procedure(s) Performed: Procedure(s) (LRB): Left Chevron and Aiken Left Great Toe, Weil Osteotomy 2nd and 3rd Metatarsal (Left)  Patient location during evaluation: PACU Anesthesia Type: MAC Level of consciousness: awake and alert Pain management: pain level controlled Vital Signs Assessment: post-procedure vital signs reviewed and stable Respiratory status: spontaneous breathing, nonlabored ventilation, respiratory function stable and patient connected to nasal cannula oxygen Cardiovascular status: stable and blood pressure returned to baseline Anesthetic complications: no    Last Vitals:  Vitals:   06/15/16 0559 06/15/16 0811  BP: (!) 121/50 (!) 141/64  Pulse: 65   Resp: 16   Temp: 37.1 C     Last Pain:  Vitals:   06/15/16 0559  TempSrc: Oral  PainSc:                  Riccardo Dubin

## 2016-06-18 ENCOUNTER — Telehealth (INDEPENDENT_AMBULATORY_CARE_PROVIDER_SITE_OTHER): Payer: Self-pay | Admitting: Orthopedic Surgery

## 2016-06-18 NOTE — Telephone Encounter (Signed)
See message below and advise.

## 2016-06-18 NOTE — Telephone Encounter (Signed)
You call,  yes patient can go home for Christmas.

## 2016-06-18 NOTE — Telephone Encounter (Signed)
I called and sw family to advise.

## 2016-06-25 ENCOUNTER — Ambulatory Visit (INDEPENDENT_AMBULATORY_CARE_PROVIDER_SITE_OTHER): Payer: Medicare HMO

## 2016-06-25 DIAGNOSIS — J4520 Mild intermittent asthma, uncomplicated: Secondary | ICD-10-CM | POA: Diagnosis not present

## 2016-06-25 MED ORDER — OMALIZUMAB 150 MG ~~LOC~~ SOLR
375.0000 mg | SUBCUTANEOUS | Status: DC
Start: 2016-06-25 — End: 2016-09-11
  Administered 2016-06-25: 375 mg via SUBCUTANEOUS

## 2016-07-02 ENCOUNTER — Ambulatory Visit (INDEPENDENT_AMBULATORY_CARE_PROVIDER_SITE_OTHER): Payer: Medicare HMO | Admitting: Orthopedic Surgery

## 2016-07-02 DIAGNOSIS — M2042 Other hammer toe(s) (acquired), left foot: Secondary | ICD-10-CM

## 2016-07-02 DIAGNOSIS — M21611 Bunion of right foot: Secondary | ICD-10-CM

## 2016-07-02 NOTE — Progress Notes (Signed)
Office Visit Note   Patient: [name]           Date of Birth: September 02, 1923           MRN: [id number] Visit Date: 07/02/2016              Requested by: Donald Prose, MD [place], [place] [address] PCP: Lynne Logan, MD   Assessment & Plan: Visit Diagnoses:  1. Bunion of great toe of right foot   2. Acquired hammer toe of left foot     Plan: We will harvest the sutures today. The Aiken osteotomy pin was removed. The Chevron osteotomy plan is been pushed into the bone. This was attempted to be removed under sterile technique with needle drivers that was unsuccessful. Discussed with the patient that we will leave the pin in place since it is buried inside the bone. Patient is given instructions for physical therapy weightbearing as tolerated with her postoperative shoe on the right she may leave Ritta Slot is skilled nursing once she is safe and independent with ambulation.  Follow-Up Instructions: Return in about 2 weeks (around 07/16/2016).   Orders:  No orders of the defined types were placed in this encounter.  No orders of the defined types were placed in this encounter.     Procedures: No procedures performed   Clinical Data: No additional findings.   Subjective: Chief Complaint  Patient presents with  . Left Foot - Routine Post Op    06/12/16  left great toe chevron , aiken left great toe,  weil osteotomy 2nd and 3rd metatarsal    Pt is at [facility] skilled nursing facility. She is non weight bearing on the right foot. The pin is not visible on the foot but is at the great toe. The incisions are well approximated and there is slight swelling but no redness and no drainage.     Review of Systems   Objective: Vital Signs: There were no vitals taken for this visit.  Physical Exam examination the incisions are well-healed there is no drainage no synovitis no signs of infection. After informed consent patient underwent local  anesthesia over the Chevron osteotomy. The skin was incised with a 15 blade knife and the pin was attempted to be removed. The patient has been impacted into the bone and was not able to be removed. We will leave this pinned in place. The Aiken osteotomy pin was removed without complications.  Ortho Exam  Specialty Comments:  No specialty comments available.  Imaging: No results found.   PMFS History: Patient Active Problem List   Diagnosis Date Noted  . Bunion of great toe of right foot 06/12/2016  . Acquired hammer toe of left foot   . Encounter for therapeutic drug monitoring 06/11/2014  . Benign essential HTN 03/22/2014  . Wrist laceration 03/01/2014  . PAF (paroxysmal atrial fibrillation) ([place])   . Hyperlipidemia   . Chronic anticoagulation 09/24/2012  . CAD (coronary artery disease) 09/24/2012  . WEIGHT LOSS 08/23/2009  . CHANGE IN BOWELS 08/23/2009  . COLONIC POLYPS, ADENOMATOUS, HX OF 08/23/2009  . Allergic rhinitis due to pollen 11/13/2008  . SINUSITIS, CHRONIC 07/13/2007  . Chronic obstructive airway disease with asthma ([place]) 06/15/2007   Past Medical History:  Diagnosis Date  . Anxiety   . Arthritis   . CAD (coronary artery disease) 2007   a. 1997 s/p PCI of diagonal.  . Constipation   . COPD/Asthma   . Diverticulosis   .  Esophageal reflux   . Family history of adverse reaction to anesthesia    daughter had trouble breathing after surgery, had to be reintubated  . Glaucoma   . HTN (hypertension), benign   . Hyperlipidemia   . Malignant melanoma ([place])    a. 09/2012 s/p resection.  . Neuropathy (HCC)    lower legs  . PAF (paroxysmal atrial fibrillation) (HCC)    a. intolerant to beta blockers due to asthma and intolerant to CCB due to rash;  b. CHA2DS2VASc = 6-->chronic coumadin;  c. 03/2014 Echo: EF 60-65%, Gr 1 DD, triv AI, mildly dil RV, PASP 21mmhg.  [name] TIA (transient ischemic attack)    a. 02/2011 - chronic coumadin in setting of PAF.  [name] Urinary,  incontinence, stress female     Family History  Problem Relation Age of Onset  . Heart disease Father   . Hemachromatosis Father   . Heart attack Father   . Colon cancer Mother   . Peripheral vascular disease Mother   . Peripheral vascular disease Sister   . Cancer Sister   . Lymphoma Sister   . Heart attack Brother   . Heart disease Brother     Past Surgical History:  Procedure Laterality Date  . ABDOMINAL HYSTERECTOMY    . APPENDECTOMY    . BACK SURGERY     lumbar laminectomy  . CHOLECYSTECTOMY    . COLONOSCOPY    . CORONARY ANGIOPLASTY WITH STENT PLACEMENT    . EYE SURGERY    . NASAL SINUS SURGERY    . VESICOVAGINAL FISTULA CLOSURE W/ TAH    . WEIL OSTEOTOMY Left 06/12/2016   Procedure: Left Chevron and Aiken Left [name], Weil Osteotomy 2nd and 3rd Metatarsal;  Surgeon: Newt Minion, MD;  Location: [facility];  Service: Orthopedics;  Laterality: Left;   Social History   Occupational History  . 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    Social History Main Topics  . Smoking status: Former Smoker    Packs/day: 0.20    Years: 25.00    Types: Cigarettes    Quit date: 07/06/1966  . Smokeless tobacco: Never Used  . Alcohol use No  . Drug use: No  . Sexual activity: Not on file

## 2016-07-08 ENCOUNTER — Telehealth: Payer: Self-pay | Admitting: Pulmonary Disease

## 2016-07-08 NOTE — Telephone Encounter (Signed)
#   vials:6 Ordered date:07/08/15 Shipping Date:07/09/15

## 2016-07-08 NOTE — Telephone Encounter (Signed)
#   Vials:6 Arrival Date:07/09/15 Lot [id number] Exp Date:6/21 forgot to put original order in.

## 2016-07-16 ENCOUNTER — Encounter (INDEPENDENT_AMBULATORY_CARE_PROVIDER_SITE_OTHER): Payer: Self-pay | Admitting: Orthopedic Surgery

## 2016-07-16 ENCOUNTER — Ambulatory Visit (INDEPENDENT_AMBULATORY_CARE_PROVIDER_SITE_OTHER): Payer: Medicare HMO | Admitting: Orthopedic Surgery

## 2016-07-16 VITALS — Ht 67.0 in | Wt 137.0 lb

## 2016-07-16 DIAGNOSIS — M2042 Other hammer toe(s) (acquired), left foot: Secondary | ICD-10-CM

## 2016-07-16 DIAGNOSIS — M21611 Bunion of right foot: Secondary | ICD-10-CM

## 2016-07-16 NOTE — Progress Notes (Signed)
Office Visit Note   Patient: [name]           Date of Birth: 05-Apr-1924           MRN: [id number] Visit Date: 07/16/2016              Requested by: Donald Prose, MD [place], [place] [address] PCP: Lynne Logan, MD  Chief Complaint  Patient presents with  . Left Foot - Routine Post Op    06/12/16 left chevron and aiken left great toe, weil osteotomy 2nd and 3rd metatarsal. ~ 1 month post op    HPI: Patient presents for left foot follow up. She has significant swelling, she normally does wear compression stockings but does not have them on today. She has numbness and tingling and as a result has no pain.     Assessment & Plan: Visit Diagnoses:  1. Bunion of great toe of right foot   2. Acquired hammer toe of left foot     Plan: Follow-up as needed. Patient is given instructions for scar massage for the dorsal incision. She will advance to regular shoewear she is asymptomatic wearing her fracture boot at this time.  Follow-Up Instructions: Return if symptoms worsen or fail to improve.   Ortho Exam Examination the ulcer between the great toe and second toe is resolved. The calloused ulcer on the plantar aspect of her foot is almost resolved with a decreasing pressure from the Weil osteotomy. Patient is quite pleased with her progress. She states her foot feels much better.  Imaging: No results found.  Orders:  No orders of the defined types were placed in this encounter.  No orders of the defined types were placed in this encounter.    Procedures: No procedures performed  Clinical Data: No additional findings.  Subjective: Review of Systems  Objective: Vital Signs: Ht 5\' 7"  (1.702 m)   Wt 137 lb (62.1 kg)   BMI 21.46 kg/m   Specialty Comments:  No specialty comments available.  PMFS History: Patient Active Problem List   Diagnosis Date Noted  . Bunion of great toe of right foot 06/12/2016  . Acquired hammer toe of  left foot   . Encounter for therapeutic drug monitoring 06/11/2014  . Benign essential HTN 03/22/2014  . Wrist laceration 03/01/2014  . PAF (paroxysmal atrial fibrillation) ([place])   . Hyperlipidemia   . Chronic anticoagulation 09/24/2012  . CAD (coronary artery disease) 09/24/2012  . WEIGHT LOSS 08/23/2009  . CHANGE IN BOWELS 08/23/2009  . COLONIC POLYPS, ADENOMATOUS, HX OF 08/23/2009  . Allergic rhinitis due to pollen 11/13/2008  . SINUSITIS, CHRONIC 07/13/2007  . Chronic obstructive airway disease with asthma ([place]) 06/15/2007   Past Medical History:  Diagnosis Date  . Anxiety   . Arthritis   . CAD (coronary artery disease) 2007   a. 1997 s/p PCI of diagonal.  . Constipation   . COPD/Asthma   . Diverticulosis   . Esophageal reflux   . Family history of adverse reaction to anesthesia    daughter had trouble breathing after surgery, had to be reintubated  . Glaucoma   . HTN (hypertension), benign   . Hyperlipidemia   . Malignant melanoma ([place])    a. 09/2012 s/p resection.  . Neuropathy (HCC)    lower legs  . PAF (paroxysmal atrial fibrillation) (HCC)    a. intolerant to beta blockers due to asthma and intolerant to CCB due to rash;  b. CHA2DS2VASc =  6-->chronic coumadin;  c. 03/2014 Echo: EF 60-65%, Gr 1 DD, triv AI, mildly dil RV, PASP 78mmhg.  [name] TIA (transient ischemic attack)    a. 02/2011 - chronic coumadin in setting of PAF.  [name] Urinary, incontinence, stress female     Family History  Problem Relation Age of Onset  . Heart disease Father   . Hemachromatosis Father   . Heart attack Father   . Colon cancer Mother   . Peripheral vascular disease Mother   . Peripheral vascular disease Sister   . Cancer Sister   . Lymphoma Sister   . Heart attack Brother   . Heart disease Brother     Past Surgical History:  Procedure Laterality Date  . ABDOMINAL HYSTERECTOMY    . APPENDECTOMY    . BACK SURGERY     lumbar laminectomy  . CHOLECYSTECTOMY    . COLONOSCOPY    .  CORONARY ANGIOPLASTY WITH STENT PLACEMENT    . EYE SURGERY    . NASAL SINUS SURGERY    . VESICOVAGINAL FISTULA CLOSURE W/ TAH    . WEIL OSTEOTOMY Left 06/12/2016   Procedure: Left Chevron and Aiken Left [name], Weil Osteotomy 2nd and 3rd Metatarsal;  Surgeon: Newt Minion, MD;  Location: [facility];  Service: Orthopedics;  Laterality: Left;   Social History   Occupational History  . 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    Social History Main Topics  . Smoking status: Former Smoker    Packs/day: 0.20    Years: 25.00    Types: Cigarettes    Quit date: 07/06/1966  . Smokeless tobacco: Never Used  . Alcohol use No  . Drug use: No  . Sexual activity: Not on file

## 2016-07-27 ENCOUNTER — Telehealth (INDEPENDENT_AMBULATORY_CARE_PROVIDER_SITE_OTHER): Payer: Self-pay | Admitting: Orthopedic Surgery

## 2016-07-27 NOTE — Telephone Encounter (Signed)
Buzz w/[employer] calling about the R foot (FYI)   Patient had bunion removed. There is a callous there, but only a small drop of drainage underneath the callous. He will continue to watch this as it seems unusual.  Patient does not have a follow up and according to Dorice Lamas has been released.

## 2016-08-06 ENCOUNTER — Ambulatory Visit (INDEPENDENT_AMBULATORY_CARE_PROVIDER_SITE_OTHER): Payer: Medicare HMO

## 2016-08-06 DIAGNOSIS — J4540 Moderate persistent asthma, uncomplicated: Secondary | ICD-10-CM | POA: Diagnosis not present

## 2016-08-07 MED ORDER — OMALIZUMAB 150 MG ~~LOC~~ SOLR
375.0000 mg | SUBCUTANEOUS | Status: DC
Start: 2016-08-07 — End: 2016-09-11
  Administered 2016-08-06: 375 mg via SUBCUTANEOUS

## 2016-08-20 ENCOUNTER — Ambulatory Visit: Payer: Medicare HMO

## 2016-08-25 ENCOUNTER — Telehealth: Payer: Self-pay | Admitting: Pulmonary Disease

## 2016-08-25 NOTE — Telephone Encounter (Signed)
Called [employer] to put a hold on xolair shipments. Pt. Has 5 doses on hand. Will call [employer] back when pt. Needs xolair. Nothing further needed.

## 2016-08-27 ENCOUNTER — Telehealth (INDEPENDENT_AMBULATORY_CARE_PROVIDER_SITE_OTHER): Payer: Self-pay | Admitting: Orthopedic Surgery

## 2016-08-27 ENCOUNTER — Ambulatory Visit (INDEPENDENT_AMBULATORY_CARE_PROVIDER_SITE_OTHER): Payer: Medicare HMO | Admitting: Orthopedic Surgery

## 2016-08-27 ENCOUNTER — Ambulatory Visit (INDEPENDENT_AMBULATORY_CARE_PROVIDER_SITE_OTHER): Payer: Medicare HMO

## 2016-08-27 ENCOUNTER — Telehealth: Payer: Self-pay | Admitting: Pulmonary Disease

## 2016-08-27 ENCOUNTER — Encounter (INDEPENDENT_AMBULATORY_CARE_PROVIDER_SITE_OTHER): Payer: Self-pay | Admitting: Orthopedic Surgery

## 2016-08-27 DIAGNOSIS — M2042 Other hammer toe(s) (acquired), left foot: Secondary | ICD-10-CM

## 2016-08-27 DIAGNOSIS — M1711 Unilateral primary osteoarthritis, right knee: Secondary | ICD-10-CM

## 2016-08-27 DIAGNOSIS — J4540 Moderate persistent asthma, uncomplicated: Secondary | ICD-10-CM

## 2016-08-27 MED ORDER — METHYLPREDNISOLONE ACETATE 40 MG/ML IJ SUSP
40.0000 mg | INTRAMUSCULAR | Status: AC | PRN
  Administered 2016-08-27: 40 mg via INTRA_ARTICULAR

## 2016-08-27 MED ORDER — LIDOCAINE HCL 1 % IJ SOLN
5.0000 mL | INTRAMUSCULAR | Status: AC | PRN
  Administered 2016-08-27: 5 mL

## 2016-08-27 NOTE — Progress Notes (Signed)
Office Visit Note   Patient: [name]           Date of Birth: 13-Jun-1924           MRN: [id number] Visit Date: 08/27/2016              Requested by: Donald Prose, MD [address] [place], [place] [address] PCP: Lynne Logan, MD  Chief Complaint  Patient presents with  . Left Foot - Open Wound    06/12/16 left chevron and aiken left great toe, weil osteotomy 2nd and 3rd metatarsal. ~ 11 weeks post op  . Right Knee - Pain  . Left Knee - Pain    HPI: Patient is a 83 y.o female who presents today with left plantar foot wound. She is status post left chevron and aiken left great toe, weil osteotomy 2nd and 3rd metatarsal. She is approximately 11 weeks post op. There is slight maceration. There is no odor, this is slight redness and warmth. She is full weightbearing today.   She also presents with bilateral knee pain, right worse than left. She states this is a chronic problem. She ambulates with cane. She denies giving way. There is swelling. She has pain with standing, and pain when riding in the car.She had received a shot of Xolair today and did not know if cortisone injections would be possible, unaware of any adverse reactions.  Maxcine Ham, RT    Assessment & Plan: Visit Diagnoses:  1. Unilateral primary osteoarthritis, right knee   2. Acquired hammer toe of left foot     Plan: Right knee was injected without complications. A felt pad was placed beneath the ulcer of the second metatarsal head left foot. Patient states that this did feel better. Callus debrided from around the ulcer. Continue with a Band-Aid and antibiotic ointment  Follow-Up Instructions: Return in about 4 weeks (around 09/24/2016).   Ortho Exam Examination patient has increased callus around the plantar ulcer left foot the callus was pared with a 10 blade knife. Felt bleeding pad placed beneath her orthotics to unload pressure. Patient has osteoarthritis of the right knee with  tenderness to palpation medial lateral joint line with crepitation with range of motion.  Imaging: No results found.  Orders:  No orders of the defined types were placed in this encounter.  No orders of the defined types were placed in this encounter.    Procedures: Large Joint Inj Date/Time: 08/27/2016 2:20 PM Performed by: DUDA, MARCUS V Authorized by: Newt Minion   Consent Given by:  Patient Site marked: the procedure site was marked   Timeout: prior to procedure the correct patient, procedure, and site was verified   Indications:  Pain and diagnostic evaluation Location:  Knee Site:  R knee Prep: patient was prepped and draped in usual sterile fashion   Needle Size:  22 G Needle Length:  1.5 inches Approach:  Anteromedial Ultrasound Guidance: No   Fluoroscopic Guidance: No   Arthrogram: No   Medications:  5 mL lidocaine 1 %; 40 mg methylPREDNISolone acetate 40 MG/ML Aspiration Attempted: No   Patient tolerance:  Patient tolerated the procedure well with no immediate complications    Clinical Data: No additional findings.  Subjective: Review of Systems  Objective: Vital Signs: There were no vitals taken for this visit.  Specialty Comments:  No specialty comments available.  PMFS History: Patient Active Problem List   Diagnosis Date Noted  . Unilateral primary osteoarthritis, right  knee 08/27/2016  . Bunion of great toe of right foot 06/12/2016  . Acquired hammer toe of left foot   . Encounter for therapeutic drug monitoring 06/11/2014  . Benign essential HTN 03/22/2014  . Wrist laceration 03/01/2014  . PAF (paroxysmal atrial fibrillation) ([place])   . Hyperlipidemia   . Chronic anticoagulation 09/24/2012  . CAD (coronary artery disease) 09/24/2012  . WEIGHT LOSS 08/23/2009  . CHANGE IN BOWELS 08/23/2009  . COLONIC POLYPS, ADENOMATOUS, HX OF 08/23/2009  . Allergic rhinitis due to pollen 11/13/2008  . SINUSITIS, CHRONIC 07/13/2007  . Chronic  obstructive airway disease with asthma ([place]) 06/15/2007   Past Medical History:  Diagnosis Date  . Anxiety   . Arthritis   . CAD (coronary artery disease) 2007   a. 1997 s/p PCI of diagonal.  . Constipation   . COPD/Asthma   . Diverticulosis   . Esophageal reflux   . Family history of adverse reaction to anesthesia    daughter had trouble breathing after surgery, had to be reintubated  . Glaucoma   . HTN (hypertension), benign   . Hyperlipidemia   . Malignant melanoma ([place])    a. 09/2012 s/p resection.  . Neuropathy (HCC)    lower legs  . PAF (paroxysmal atrial fibrillation) (HCC)    a. intolerant to beta blockers due to asthma and intolerant to CCB due to rash;  b. CHA2DS2VASc = 6-->chronic coumadin;  c. 03/2014 Echo: EF 60-65%, Gr 1 DD, triv AI, mildly dil RV, PASP 72mmhg.  [name] TIA (transient ischemic attack)    a. 02/2011 - chronic coumadin in setting of PAF.  [name] Urinary, incontinence, stress female     Family History  Problem Relation Age of Onset  . Heart disease Father   . Hemachromatosis Father   . Heart attack Father   . Colon cancer Mother   . Peripheral vascular disease Mother   . Peripheral vascular disease Sister   . Cancer Sister   . Lymphoma Sister   . Heart attack Brother   . Heart disease Brother     Past Surgical History:  Procedure Laterality Date  . ABDOMINAL HYSTERECTOMY    . APPENDECTOMY    . BACK SURGERY     lumbar laminectomy  . CHOLECYSTECTOMY    . COLONOSCOPY    . CORONARY ANGIOPLASTY WITH STENT PLACEMENT    . EYE SURGERY    . NASAL SINUS SURGERY    . VESICOVAGINAL FISTULA CLOSURE W/ TAH    . WEIL OSTEOTOMY Left 06/12/2016   Procedure: Left Chevron and Aiken Left [name], Weil Osteotomy 2nd and 3rd Metatarsal;  Surgeon: Newt Minion, MD;  Location: [facility];  Service: Orthopedics;  Laterality: Left;   Social History   Occupational History  . 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    Social History Main Topics  . Smoking status: Former  Smoker    Packs/day: 0.20    Years: 25.00    Types: Cigarettes    Quit date: 07/06/1966  . Smokeless tobacco: Never Used  . Alcohol use No  . Drug use: No  . Sexual activity: Not on file

## 2016-08-27 NOTE — Telephone Encounter (Signed)
Patient called advised she is at the doctor's office ([name]) at [facility] and want to know if she get an injection in her foot today when she sees Dr Sharol Given would there be a reaction if she also get an injection of Xolar at [facility]. [name] asked to be called at her office as soon as possible.  The number to contact Janice is [phone]

## 2016-08-27 NOTE — Telephone Encounter (Addendum)
#   Vials:6 Arrival Date:08/27/16 Lot [id number] Exp Date:8/21  Meds. Were sent to Pt's house by mistake.

## 2016-08-31 NOTE — Telephone Encounter (Signed)
Error

## 2016-09-10 ENCOUNTER — Ambulatory Visit (INDEPENDENT_AMBULATORY_CARE_PROVIDER_SITE_OTHER): Payer: Medicare HMO

## 2016-09-10 DIAGNOSIS — J4520 Mild intermittent asthma, uncomplicated: Secondary | ICD-10-CM

## 2016-09-11 MED ORDER — OMALIZUMAB 150 MG ~~LOC~~ SOLR
375.0000 mg | SUBCUTANEOUS | Status: DC
  Administered 2016-09-10: 375 mg via SUBCUTANEOUS

## 2016-09-11 MED ORDER — OMALIZUMAB 150 MG ~~LOC~~ SOLR
375.0000 mg | Freq: Once | SUBCUTANEOUS | Status: AC
  Administered 2016-08-27: 375 mg via SUBCUTANEOUS

## 2016-09-11 NOTE — Progress Notes (Signed)
Documentation of medication administration of Xolair and charges have been completed by Lindsay Lemons, CMA based on the Xolair documentation sheet completed by Tammy Scott.  

## 2016-09-21 ENCOUNTER — Encounter: Payer: Self-pay | Admitting: *Deleted

## 2016-09-21 DIAGNOSIS — J309 Allergic rhinitis, unspecified: Secondary | ICD-10-CM | POA: Insufficient documentation

## 2016-09-21 DIAGNOSIS — J45901 Unspecified asthma with (acute) exacerbation: Secondary | ICD-10-CM | POA: Insufficient documentation

## 2016-09-21 DIAGNOSIS — M1990 Unspecified osteoarthritis, unspecified site: Secondary | ICD-10-CM | POA: Insufficient documentation

## 2016-09-21 DIAGNOSIS — E039 Hypothyroidism, unspecified: Secondary | ICD-10-CM | POA: Insufficient documentation

## 2016-09-21 DIAGNOSIS — R0602 Shortness of breath: Secondary | ICD-10-CM | POA: Insufficient documentation

## 2016-09-21 DIAGNOSIS — F419 Anxiety disorder, unspecified: Secondary | ICD-10-CM | POA: Insufficient documentation

## 2016-09-21 DIAGNOSIS — H409 Unspecified glaucoma: Secondary | ICD-10-CM | POA: Insufficient documentation

## 2016-09-24 ENCOUNTER — Ambulatory Visit (INDEPENDENT_AMBULATORY_CARE_PROVIDER_SITE_OTHER): Payer: Medicare HMO

## 2016-09-24 ENCOUNTER — Ambulatory Visit (INDEPENDENT_AMBULATORY_CARE_PROVIDER_SITE_OTHER): Payer: Medicare HMO | Admitting: Orthopedic Surgery

## 2016-09-24 DIAGNOSIS — M1711 Unilateral primary osteoarthritis, right knee: Secondary | ICD-10-CM

## 2016-09-24 DIAGNOSIS — L97521 Non-pressure chronic ulcer of other part of left foot limited to breakdown of skin: Secondary | ICD-10-CM

## 2016-09-24 DIAGNOSIS — J4540 Moderate persistent asthma, uncomplicated: Secondary | ICD-10-CM | POA: Diagnosis not present

## 2016-09-24 NOTE — Progress Notes (Signed)
Office Visit Note   Patient: [name]           Date of Birth: 06-Jul-1924           MRN: [id number] Visit Date: 09/24/2016              Requested by: Donald Prose, MD [address] [place], [place] [address] PCP: Lynne Logan, MD  Chief Complaint  Patient presents with  . Left Foot - Follow-up    Left foot recheck 4wk ROV, abx ointment dressing changes  . Right Knee - Follow-up    s/p injection 08/27/16    HPI: Patient states that her foot is feeling better she complains of arthritis in her right knee at the previous injection provided her no relief she has pain with weightbearing pain with activities of daily living she is using antibiotic ointment on the second metatarsal head ulcer left foot.  Assessment & Plan: Visit Diagnoses:  1. Unilateral primary osteoarthritis, right knee   2. Non-pressure chronic ulcer of other part of left foot limited to breakdown of skin ([place])     Plan: We'll continue wound care for the second metatarsal head ulcer. Discussed that if this does not show improvement we dissected second metatarsal head.  Follow-Up Instructions: Return in about 2 weeks (around 10/08/2016).   Ortho Exam  Patient is alert, oriented, no adenopathy, well-dressed, normal affect, normal respiratory effort. Patient has an antalgic gait. Examination she has a good dorsalis pedis pulse she has a persistent ulcer beneath the second metatarsal head status post Weil osteotomy for the second and third metatarsals. This ulcer now probes down to fascia. I'm concerned that this could possibly become infected. There is no redness no synovitis no drainage no odor no signs of infection. The ulcers 5 mm in diameter and 5 mm deep.  Imaging: No results found.  Labs: Lab Results  Component Value Date   HGBA1C 5.3 02/24/2011   ESRSEDRATE 57 (H) 03/16/2014   REPTSTATUS 03/28/2013 FINAL 03/25/2013   CULT NO GROWTH Performed at [facility] 03/25/2013     Orders:  No orders of the defined types were placed in this encounter.  No orders of the defined types were placed in this encounter.    Procedures: No procedures performed  Clinical Data: No additional findings.  ROS: Review of Systems  All other systems reviewed and are negative.   Objective: Vital Signs: There were no vitals taken for this visit.  Specialty Comments:  No specialty comments available.  PMFS History: Patient Active Problem List   Diagnosis Date Noted  . Non-pressure chronic ulcer of other part of left foot limited to breakdown of skin ([place]) 09/24/2016  . Acute asthma exacerbation 09/21/2016  . Allergic rhinitis 09/21/2016  . Anxiety 09/21/2016  . Arthritis 09/21/2016  . Breath shortness 09/21/2016  . Glaucoma 09/21/2016  . Adult hypothyroidism 09/21/2016  . Unilateral primary osteoarthritis, right knee 08/27/2016  . Bunion of great toe of right foot 06/12/2016  . Acquired hammer toe of left foot   . Irregular bowel habits 10/25/2014  . Cerumen impaction 09/26/2014  . Encounter for therapeutic drug monitoring 06/11/2014  . Essential hypertension 03/22/2014  . Wrist laceration 03/01/2014  . Primary open angle glaucoma of left eye, moderate stage 11/16/2013  . Primary open angle glaucoma of right eye, mild stage 11/16/2013  . Atrial fibrillation ([place])   . Other and unspecified hyperlipidemia   . Long term current use of anticoagulant therapy 09/24/2012  .  CAD (coronary artery disease) 09/24/2012  . Transient cerebral ischemia 03/05/2011  . WEIGHT LOSS 08/23/2009  . CHANGE IN BOWELS 08/23/2009  . COLONIC POLYPS, ADENOMATOUS, HX OF 08/23/2009  . Anxiety state 07/02/2009  . Allergic rhinitis due to pollen 11/13/2008  . Major depressive disorder, single episode, moderate ([place]) 05/14/2008  . Sinus infection 07/13/2007  . Airway hyperreactivity 06/15/2007   Past Medical History:  Diagnosis Date  . Anxiety   . Arthritis   . CAD (coronary  artery disease) 2007   a. 1997 s/p PCI of diagonal.  . Constipation   . COPD/Asthma   . Diverticulosis   . Esophageal reflux   . Family history of adverse reaction to anesthesia    daughter had trouble breathing after surgery, had to be reintubated  . Glaucoma   . HTN (hypertension), benign   . Hyperlipidemia   . Malignant melanoma ([place])    a. 09/2012 s/p resection.  . Neuropathy (HCC)    lower legs  . PAF (paroxysmal atrial fibrillation) (HCC)    a. intolerant to beta blockers due to asthma and intolerant to CCB due to rash;  b. CHA2DS2VASc = 6-->chronic coumadin;  c. 03/2014 Echo: EF 60-65%, Gr 1 DD, triv AI, mildly dil RV, PASP 77mmhg.  [name] TIA (transient ischemic attack)    a. 02/2011 - chronic coumadin in setting of PAF.  [name] Urinary, incontinence, stress female     Family History  Problem Relation Age of Onset  . Heart disease Father   . Hemachromatosis Father   . Heart attack Father   . Colon cancer Mother   . Peripheral vascular disease Mother   . Peripheral vascular disease Sister   . Cancer Sister   . Lymphoma Sister   . Heart attack Brother   . Heart disease Brother     Past Surgical History:  Procedure Laterality Date  . ABDOMINAL HYSTERECTOMY    . APPENDECTOMY    . BACK SURGERY     lumbar laminectomy  . CHOLECYSTECTOMY    . COLONOSCOPY    . CORONARY ANGIOPLASTY WITH STENT PLACEMENT    . EYE SURGERY    . NASAL SINUS SURGERY    . VESICOVAGINAL FISTULA CLOSURE W/ TAH    . WEIL OSTEOTOMY Left 06/12/2016   Procedure: Left Chevron and Aiken Left [name], Weil Osteotomy 2nd and 3rd Metatarsal;  Surgeon: Newt Minion, MD;  Location: [facility];  Service: Orthopedics;  Laterality: Left;   Social History   Occupational History  . 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    Social History Main Topics  . Smoking status: Former Smoker    Packs/day: 0.20    Years: 25.00    Types: Cigarettes    Quit date: 07/06/1966  . Smokeless tobacco: Never Used  . Alcohol use No  .  Drug use: No  . Sexual activity: Not on file

## 2016-09-25 ENCOUNTER — Telehealth: Payer: Self-pay | Admitting: Pulmonary Disease

## 2016-09-25 MED ORDER — OMALIZUMAB 150 MG ~~LOC~~ SOLR
375.0000 mg | Freq: Once | SUBCUTANEOUS | Status: AC
  Administered 2016-09-24: 375 mg via SUBCUTANEOUS

## 2016-09-25 NOTE — Progress Notes (Signed)
Pt rec'd 375 mg 1.79ml x 2 in Right and 1.1ml x 1 Left Arm.

## 2016-09-25 NOTE — Telephone Encounter (Signed)
#   Vials:6 Arrival Date:3/08/08/16 Lot #[id number] Exp Date:9/21  Pharm. Shipped xolair too soon pt. Missed a couple of shos b/c of rehab.. I called the pharm. And put a hold on her rx. Will call in 2 1/2 wks. To order meds.[name]

## 2016-09-30 ENCOUNTER — Ambulatory Visit (INDEPENDENT_AMBULATORY_CARE_PROVIDER_SITE_OTHER): Payer: Medicare HMO | Admitting: Cardiology

## 2016-09-30 ENCOUNTER — Encounter: Payer: Self-pay | Admitting: Cardiology

## 2016-09-30 VITALS — BP 118/50 | HR 76 | Ht 67.0 in | Wt 133.0 lb

## 2016-09-30 DIAGNOSIS — I481 Persistent atrial fibrillation: Secondary | ICD-10-CM | POA: Diagnosis not present

## 2016-09-30 DIAGNOSIS — I10 Essential (primary) hypertension: Secondary | ICD-10-CM | POA: Diagnosis not present

## 2016-09-30 DIAGNOSIS — I4819 Other persistent atrial fibrillation: Secondary | ICD-10-CM

## 2016-09-30 DIAGNOSIS — E785 Hyperlipidemia, unspecified: Secondary | ICD-10-CM | POA: Diagnosis not present

## 2016-09-30 DIAGNOSIS — I2510 Atherosclerotic heart disease of native coronary artery without angina pectoris: Secondary | ICD-10-CM | POA: Diagnosis not present

## 2016-09-30 NOTE — Patient Instructions (Signed)

## 2016-09-30 NOTE — Progress Notes (Signed)
Cardiology Office Note    Date:  09/30/2016   ID:  AGATHA DUPLECHAIN, DOB Sep 14, 1923, MRN [id number]  PCP:  Lynne Logan, MD  Cardiologist:  Fransico Him, MD   Chief Complaint  Patient presents with  . Coronary Artery Disease  . Hypertension  . Atrial Fibrillation    History of Present Illness:  [name] is a 83 y.o. female  with a history of ASCAD s/p remote PCI of the diag/PAF/HTN/dyslipidemia who presents today for followup. Lexiscan myoview 02/2015 showed no ischemia and normal LVF. 2D echo was normal. She is doing well today. She denies any anginal chest pain. She denies any dizziness or syncope. She has chronic SOB due to asthma which is stable with her inhalers but if she overexerts herself she will get SOB.  She has a chronic cough from post nasal gtt. She has chronic LE edema which is stable. She still gets occasional skipped heart beats but feels there are no worse than usual.    Past Medical History:  Diagnosis Date  . Anxiety   . Arthritis   . CAD (coronary artery disease) 2007   a. 1997 s/p PCI of diagonal.  . Constipation   . COPD/Asthma   . Diverticulosis   . Esophageal reflux   . Family history of adverse reaction to anesthesia    daughter had trouble breathing after surgery, had to be reintubated  . Glaucoma   . HTN (hypertension), benign   . Hyperlipidemia   . Malignant melanoma ([place])    a. 09/2012 s/p resection.  . Neuropathy (HCC)    lower legs  . PAF (paroxysmal atrial fibrillation) (HCC)    a. intolerant to beta blockers due to asthma and intolerant to CCB due to rash;  b. CHA2DS2VASc = 6-->chronic coumadin;  c. 03/2014 Echo: EF 60-65%, Gr 1 DD, triv AI, mildly dil RV, PASP 46mmhg.  [name] TIA (transient ischemic attack)    a. 02/2011 - chronic coumadin in setting of PAF.  [name] Urinary, incontinence, stress female     Past Surgical History:  Procedure Laterality Date  . ABDOMINAL HYSTERECTOMY    . APPENDECTOMY    . BACK SURGERY       lumbar laminectomy  . CHOLECYSTECTOMY    . COLONOSCOPY    . CORONARY ANGIOPLASTY WITH STENT PLACEMENT    . EYE SURGERY    . NASAL SINUS SURGERY    . VESICOVAGINAL FISTULA CLOSURE W/ TAH    . WEIL OSTEOTOMY Left 06/12/2016   Procedure: Left Chevron and Aiken Left [name], Weil Osteotomy 2nd and 3rd Metatarsal;  Surgeon: Newt Minion, MD;  Location: [facility];  Service: Orthopedics;  Laterality: Left;    Current Medications: Current Meds  Medication Sig  . acetaminophen (TYLENOL) 325 MG tablet Take 325 mg by mouth every 6 (six) hours as needed for moderate pain.   [name] albuterol (PROAIR HFA) 108 (90 Base) MCG/ACT inhaler Inhale 1-2 puffs into the lungs every 4 (four) hours as needed for wheezing or shortness of breath.  . ALPRAZolam (XANAX) 0.25 MG tablet Take 1 tablet (0.25 mg total) by mouth at bedtime as needed for anxiety.  [name] amLODipine-valsartan (EXFORGE) 10-320 MG tablet TAKE ONE TABLET BY MOUTH ONCE DAILY  . cetirizine (ZYRTEC) 10 MG tablet Take 10 mg by mouth daily as needed for allergies.  . Cholecalciferol (VITAMIN D) 2000 units tablet Take 2,000 Units by mouth daily.  . fluticasone (FLONASE) 50 MCG/ACT nasal spray Place 1 spray into both  nostrils daily as needed for allergies or rhinitis.  . fluticasone furoate-vilanterol (BREO ELLIPTA) 100-25 MCG/INH AEPB Inhale 1 puff into the lungs daily.  . furosemide (LASIX) 40 MG tablet Take 1 tablet (40 mg total) by mouth daily as needed for fluid.  [name] latanoprost (XALATAN) 0.005 % ophthalmic solution Place 1 drop into both eyes at bedtime.  . Multiple Vitamins-Minerals (MULTIVITAMIN & MINERAL PO) Take 1 tablet by mouth daily.  . pilocarpine (PILOCAR) 1 % ophthalmic solution Place 1 drop into both eyes 3 times daily.  . pravastatin (PRAVACHOL) 20 MG tablet Take 1 tablet (20 mg total) by mouth every evening.  . warfarin (COUMADIN) 2.5 MG tablet Take 2.5-3.75 mg by mouth daily. Take 3.75 mg (1.5 tablets) on Sun, Tues, Weds, Thurs, Sat & Take 2.5  mg (1 tablet) on Mon & Fri  . XOLAIR 150 MG injection Inject 150 mg into the skin every 14 (fourteen) days.     Allergies:   Alphagan [brimonidine]; Apraclonidine; Biaxin [clarithromycin]; Brovana [arformoterol]; Budesonide; Cefdinir; Cephalexin; Cephalexin; Cifenline; Clonidine; Clonidine derivatives; Dorzolamide; Iohexol; Nsaids; Penicillins; Sulfamethoxazole; Sulfonamide derivatives; Tetracycline; Timolol; Vancomycin; Zetia [ezetimibe]; and Cardizem [diltiazem hcl]   Social History   Social History  . Marital status: Divorced    Spouse name: N/A  . Number of children: N/A  . Years of education: N/A   Occupational History  . 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    Social History Main Topics  . Smoking status: Former Smoker    Packs/day: 0.20    Years: 25.00    Types: Cigarettes    Quit date: 07/06/1966  . Smokeless tobacco: Never Used  . Alcohol use No  . Drug use: No  . Sexual activity: Not Asked   Other Topics Concern  . None   Social History Narrative   Lives in [place] with her dog.  She is very independent.     Family History:  The patient's family history includes Cancer in her sister; Colon cancer in her mother; Heart attack in her brother and father; Heart disease in her brother and father; Hemachromatosis in her father; Lymphoma in her sister; Peripheral vascular disease in her mother and sister.   ROS:   Please see the history of present illness.    Review of Systems  Musculoskeletal: Positive for joint swelling.  Gastrointestinal: Positive for constipation.   All other systems reviewed and are negative.  No flowsheet data found.     PHYSICAL EXAM:   VS:  BP (!) 118/50   Pulse 76   Ht 5\' 7"  (1.702 m)   Wt 133 lb (60.3 kg)   BMI 20.83 kg/m    GEN: Well nourished, well developed, in no acute distress  HEENT: normal  Neck: no JVD, carotid bruits, or masses Cardiac: RRR with occasional ectopy; no murmurs, rubs, or gallops,no edema.  Intact distal pulses  bilaterally.  Respiratory:  clear to auscultation bilaterally, normal work of breathing GI: soft, nontender, nondistended, + BS MS: no deformity or atrophy  Skin: warm and dry, no rash Neuro:  Alert and Oriented x 3, Strength and sensation are intact Psych: euthymic mood, full affect  Wt Readings from Last 3 Encounters:  09/30/16 133 lb (60.3 kg)  07/16/16 137 lb (62.1 kg)  06/14/16 137 lb 12.6 oz (62.5 kg)      Studies/Labs Reviewed:   EKG:  EKG is ordered today.  The ekg ordered today demonstrates NSR with PACs  Recent Labs: 11/20/2015: ALT 8 06/12/2016: BUN 17;  Creatinine, Ser 0.87; Potassium 3.9; Sodium 137 06/15/2016: Hemoglobin 10.7; Platelets 157   Lipid Panel    Component Value Date/Time   CHOL 134 11/20/2015 1144   TRIG 64 11/20/2015 1144   HDL 76 11/20/2015 1144   CHOLHDL 1.8 11/20/2015 1144   VLDL 13 11/20/2015 1144   LDLCALC 45 11/20/2015 1144    Additional studies/ records that were reviewed today include:  none    ASSESSMENT:    1. Coronary artery disease involving native coronary artery of native heart without angina pectoris   2. Essential hypertension   3. Persistent atrial fibrillation ([place])   4. Hyperlipidemia LDL goal <70      PLAN:  In order of problems listed above:  1. ASCAD s/p remote PCI of diag.  Her last nuclear stress test in 2016 showed no ischemia.  She has not had any anginal chest pain. She will continue on statin.  She is not on ASA due to warfarin.  2. HTN - BP controlled on current meds.  She will continue on amlodipine and ARB. 3. Persistent atrial fibrillation maintaining NSR with PACs on EKG - she will continue on warfarin. 4. Hyperlipidemia with LDL goal < 70.  She will continue on pravastatin.      Medication Adjustments/Labs and Tests Ordered: Current medicines are reviewed at length with the patient today.  Concerns regarding medicines are outlined above.  Medication changes, Labs and Tests ordered today are listed in  the Patient Instructions below.  There are no Patient Instructions on file for this visit.   Signed, Fransico Him, MD  09/30/2016 11:28 AM    [address], [place], [place]  [address] Phone: [phone]; Fax: [phone]

## 2016-10-08 ENCOUNTER — Ambulatory Visit (INDEPENDENT_AMBULATORY_CARE_PROVIDER_SITE_OTHER): Payer: Medicare HMO

## 2016-10-08 ENCOUNTER — Ambulatory Visit (INDEPENDENT_AMBULATORY_CARE_PROVIDER_SITE_OTHER): Payer: Medicare HMO | Admitting: Family

## 2016-10-08 DIAGNOSIS — J4540 Moderate persistent asthma, uncomplicated: Secondary | ICD-10-CM

## 2016-10-08 DIAGNOSIS — M1711 Unilateral primary osteoarthritis, right knee: Secondary | ICD-10-CM | POA: Diagnosis not present

## 2016-10-08 DIAGNOSIS — L97524 Non-pressure chronic ulcer of other part of left foot with necrosis of bone: Secondary | ICD-10-CM | POA: Diagnosis not present

## 2016-10-08 NOTE — Progress Notes (Signed)
Office Visit Note   Patient: [name]           Date of Birth: 11/17/1923           MRN: [id number] Visit Date: 10/08/2016              Requested by: Donald Prose, MD [address] [place], [place] [address] PCP: Lynne Logan, MD  No chief complaint on file.   HPI: Patient is a 83 year old woman who presents today for 2 separate issues. Right knee pain and left foot ulcer.  she complains of arthritis in her right knee at the previous injection provided her no relief she has pain with weightbearing pain with activities of daily living. she is using antibiotic ointment on the second metatarsal head ulcer left foot. Cleansing this daily. Attempting to minimize weightbearing. Does have a felt pressure reliving donut as well.  Assessment & Plan: Visit Diagnoses:  1. Unilateral primary osteoarthritis, right knee   2. Ulcer of toe of left foot, with necrosis of bone ([place])     Plan: We'll continue wound care for the second metatarsal head ulcer. Recommended that she discuss surgical options with Dr. Sharol Given. We have discussed surgical excision of the second metatarsal head. The patient is not interested in surgery at this time. Discussed risks at length. Family accompanies the appointment. We will follow her closely. return precautions discussed.  The patient will be called to return for Monovisc injection right knee once approved.  Follow-Up Instructions: Return in about 4 weeks (around 11/05/2016).   Ortho Exam  Patient is alert, oriented, no adenopathy, well-dressed, normal affect, normal respiratory effort. Patient has an antalgic gait. Examination she has a good dorsalis pedis pulse she has a persistent ulcer beneath the second metatarsal head status post Weil osteotomy for the second and third metatarsals. This ulcer now probes down to bone. I'm concerned that this could possibly become infected. There is no redness no synovitis no drainage no odor no signs of  infection. The ulcer is 5 mm in diameter and 5 mm deep.  Imaging: No results found.  Labs: Lab Results  Component Value Date   HGBA1C 5.3 02/24/2011   ESRSEDRATE 57 (H) 03/16/2014   REPTSTATUS 03/28/2013 FINAL 03/25/2013   CULT NO GROWTH Performed at [facility] 03/25/2013    Orders:  No orders of the defined types were placed in this encounter.  No orders of the defined types were placed in this encounter.    Procedures: No procedures performed  Clinical Data: No additional findings.  ROS: Review of Systems  Constitutional: Negative for chills and fever.  Musculoskeletal: Positive for arthralgias.  Skin: Positive for wound.  All other systems reviewed and are negative.   Objective: Vital Signs: There were no vitals taken for this visit.  Specialty Comments:  No specialty comments available.  PMFS History: Patient Active Problem List   Diagnosis Date Noted  . Ulcer of toe of left foot, with necrosis of bone ([place]) 10/08/2016  . Non-pressure chronic ulcer of other part of left foot limited to breakdown of skin ([place]) 09/24/2016  . Acute asthma exacerbation 09/21/2016  . Allergic rhinitis 09/21/2016  . Anxiety 09/21/2016  . Arthritis 09/21/2016  . Breath shortness 09/21/2016  . Glaucoma 09/21/2016  . Adult hypothyroidism 09/21/2016  . Unilateral primary osteoarthritis, right knee 08/27/2016  . Bunion of great toe of right foot 06/12/2016  . Acquired hammer toe of left foot   . Irregular bowel habits  10/25/2014  . Cerumen impaction 09/26/2014  . Encounter for therapeutic drug monitoring 06/11/2014  . Essential hypertension 03/22/2014  . Wrist laceration 03/01/2014  . Primary open angle glaucoma of left eye, moderate stage 11/16/2013  . Primary open angle glaucoma of right eye, mild stage 11/16/2013  . Persistent atrial fibrillation ([place])   . Hyperlipidemia LDL goal <70   . Long term current use of anticoagulant therapy 09/24/2012  . CAD (coronary  artery disease) 09/24/2012  . Transient cerebral ischemia 03/05/2011  . WEIGHT LOSS 08/23/2009  . CHANGE IN BOWELS 08/23/2009  . COLONIC POLYPS, ADENOMATOUS, HX OF 08/23/2009  . Anxiety state 07/02/2009  . Allergic rhinitis due to pollen 11/13/2008  . Major depressive disorder, single episode, moderate ([place]) 05/14/2008  . Sinus infection 07/13/2007  . Airway hyperreactivity 06/15/2007   Past Medical History:  Diagnosis Date  . Anxiety   . Arthritis   . CAD (coronary artery disease) 2007   a. 1997 s/p PCI of diagonal.  . Constipation   . COPD/Asthma   . Diverticulosis   . Esophageal reflux   . Family history of adverse reaction to anesthesia    daughter had trouble breathing after surgery, had to be reintubated  . Glaucoma   . HTN (hypertension), benign   . Hyperlipidemia   . Malignant melanoma ([place])    a. 09/2012 s/p resection.  . Neuropathy (HCC)    lower legs  . PAF (paroxysmal atrial fibrillation) (HCC)    a. intolerant to beta blockers due to asthma and intolerant to CCB due to rash;  b. CHA2DS2VASc = 6-->chronic coumadin;  c. 03/2014 Echo: EF 60-65%, Gr 1 DD, triv AI, mildly dil RV, PASP 35mmhg.  [name] TIA (transient ischemic attack)    a. 02/2011 - chronic coumadin in setting of PAF.  [name] Urinary, incontinence, stress female     Family History  Problem Relation Age of Onset  . Heart disease Father   . Hemachromatosis Father   . Heart attack Father   . Colon cancer Mother   . Peripheral vascular disease Mother   . Peripheral vascular disease Sister   . Cancer Sister   . Lymphoma Sister   . Heart attack Brother   . Heart disease Brother     Past Surgical History:  Procedure Laterality Date  . ABDOMINAL HYSTERECTOMY    . APPENDECTOMY    . BACK SURGERY     lumbar laminectomy  . CHOLECYSTECTOMY    . COLONOSCOPY    . CORONARY ANGIOPLASTY WITH STENT PLACEMENT    . EYE SURGERY    . NASAL SINUS SURGERY    . VESICOVAGINAL FISTULA CLOSURE W/ TAH    . WEIL OSTEOTOMY Left  06/12/2016   Procedure: Left Chevron and Aiken Left [name], Weil Osteotomy 2nd and 3rd Metatarsal;  Surgeon: Newt Minion, MD;  Location: [facility];  Service: Orthopedics;  Laterality: Left;   Social History   Occupational History  . 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    Social History Main Topics  . Smoking status: Former Smoker    Packs/day: 0.20    Years: 25.00    Types: Cigarettes    Quit date: 07/06/1966  . Smokeless tobacco: Never Used  . Alcohol use No  . Drug use: No  . Sexual activity: Not on file

## 2016-10-09 ENCOUNTER — Telehealth (INDEPENDENT_AMBULATORY_CARE_PROVIDER_SITE_OTHER): Payer: Self-pay | Admitting: Radiology

## 2016-10-09 MED ORDER — OMALIZUMAB 150 MG ~~LOC~~ SOLR
375.0000 mg | SUBCUTANEOUS | Status: DC
  Administered 2016-10-08: 375 mg via SUBCUTANEOUS

## 2016-10-09 NOTE — Telephone Encounter (Signed)
-----   Message from Suzan Slick, NP sent at 10/08/2016 10:40 AM EDT ----- monovisc polease

## 2016-10-09 NOTE — Telephone Encounter (Signed)
Applied for monovisc right knee on portal today. Pending approval.

## 2016-10-09 NOTE — Progress Notes (Signed)
Xolair injection documentation and charges entered by Analleli Gierke, RMA, based on injection sheet filled out by Tammy Scott per office protocol.   

## 2016-10-12 NOTE — Telephone Encounter (Signed)
Patients insurance requires prior authorization. This was submitted, Insurance has mandatory 14 day turn around time. We will call back and check on this after a couple of days.

## 2016-10-19 ENCOUNTER — Telehealth (INDEPENDENT_AMBULATORY_CARE_PROVIDER_SITE_OTHER): Payer: Self-pay | Admitting: Radiology

## 2016-10-19 NOTE — Telephone Encounter (Signed)
Faxed prior radiographs from 2009, 2012 showing radiographic evidence of right knee arthritis. This was faxed as well as office notes to [phone]. This is needed for patients monovisc approval. They will let us know if an updated xray is necessary, and if injection was approved.

## 2016-10-22 ENCOUNTER — Telehealth (INDEPENDENT_AMBULATORY_CARE_PROVIDER_SITE_OTHER): Payer: Self-pay | Admitting: *Deleted

## 2016-10-22 ENCOUNTER — Ambulatory Visit (INDEPENDENT_AMBULATORY_CARE_PROVIDER_SITE_OTHER): Payer: Medicare HMO

## 2016-10-22 ENCOUNTER — Telehealth: Payer: Self-pay

## 2016-10-22 DIAGNOSIS — J4540 Moderate persistent asthma, uncomplicated: Secondary | ICD-10-CM | POA: Diagnosis not present

## 2016-10-22 NOTE — Telephone Encounter (Signed)
Lennette Bihari from [employer] called this afternoon in regards to needing to have some orders faxed back and signed please. He said it is an prior authorization for the right knee support/ brace. Thank you The Fax # [phone]. Thank you

## 2016-10-22 NOTE — Telephone Encounter (Signed)
SENT NOTES TO SCHEDULING 

## 2016-10-23 MED ORDER — OMALIZUMAB 150 MG ~~LOC~~ SOLR
375.0000 mg | SUBCUTANEOUS | Status: DC
  Administered 2016-10-22: 375 mg via SUBCUTANEOUS

## 2016-10-23 NOTE — Progress Notes (Signed)
Xolair injection documentation and charges entered by Ashley Caulfield, RMA, based on injection sheet filled out by Tammy Scott per office protocol.   

## 2016-10-27 ENCOUNTER — Telehealth (INDEPENDENT_AMBULATORY_CARE_PROVIDER_SITE_OTHER): Payer: Self-pay | Admitting: Radiology

## 2016-10-27 ENCOUNTER — Telehealth: Payer: Self-pay | Admitting: Pulmonary Disease

## 2016-10-27 NOTE — Telephone Encounter (Signed)
I called and spoke with [facility] today pending prior authorization for monovisc for patients right knee. Additional records were requested and these were faxed on 10/19/16. These were review and patients case has been approved. I spoke with Shelda Pal who gave approval number [id number]. I have called and left voicemail for patient to make her aware. Advised her to call the office and we can set up appointment for her to get this shot.

## 2016-10-27 NOTE — Telephone Encounter (Signed)
We will fax back once information is received.

## 2016-10-27 NOTE — Telephone Encounter (Signed)
#   vials:6 Ordered date:10/27/16 Shipping Date:10/28/16 Once pharm. Rep. Gets pt. permission they will be able to ship her xolair.

## 2016-10-28 ENCOUNTER — Telehealth (INDEPENDENT_AMBULATORY_CARE_PROVIDER_SITE_OTHER): Payer: Self-pay | Admitting: Radiology

## 2016-10-28 NOTE — Telephone Encounter (Signed)
I called left voicemail advising patient monovisc for right knee was approved and for her to call so we can get her on the schedule for this shot.

## 2016-10-29 NOTE — Telephone Encounter (Signed)
#   Vials:6 Arrival Date:10/29/16 Lot #:[id number] Exp Date:9/21

## 2016-10-30 ENCOUNTER — Telehealth (INDEPENDENT_AMBULATORY_CARE_PROVIDER_SITE_OTHER): Payer: Self-pay | Admitting: Orthopedic Surgery

## 2016-10-30 NOTE — Telephone Encounter (Signed)
[facility], calling for last 3 office notes for pt faxed to them please.  [phone]

## 2016-11-02 NOTE — Telephone Encounter (Signed)
Last 4 notes faxed as requested.

## 2016-11-05 ENCOUNTER — Ambulatory Visit (INDEPENDENT_AMBULATORY_CARE_PROVIDER_SITE_OTHER): Payer: Medicare HMO | Admitting: Orthopedic Surgery

## 2016-11-05 ENCOUNTER — Ambulatory Visit: Payer: Medicare HMO

## 2016-11-05 DIAGNOSIS — L97524 Non-pressure chronic ulcer of other part of left foot with necrosis of bone: Secondary | ICD-10-CM

## 2016-11-05 DIAGNOSIS — M1711 Unilateral primary osteoarthritis, right knee: Secondary | ICD-10-CM

## 2016-11-05 MED ORDER — LIDOCAINE HCL 1 % IJ SOLN
5.0000 mL | INTRAMUSCULAR | Status: AC | PRN
  Administered 2016-11-05: 5 mL

## 2016-11-05 MED ORDER — HYALURONAN 88 MG/4ML IX SOSY
88.0000 mg | PREFILLED_SYRINGE | INTRA_ARTICULAR | Status: AC | PRN
  Administered 2016-11-05: 88 mg via INTRA_ARTICULAR

## 2016-11-05 MED ORDER — DOXYCYCLINE HYCLATE 100 MG PO TABS: 100.0000 mg | ORAL_TABLET | Freq: Two times a day (BID) | ORAL | 0 refills | Status: DC

## 2016-11-05 NOTE — Progress Notes (Signed)
Office Visit Note   Patient: [name]           Date of Birth: Oct 22, 1923           MRN: [id number] Visit Date: 11/05/2016              Requested by: Donald Prose, MD [place], [place] [address] PCP: Lynne Logan, MD  Chief Complaint  Patient presents with  . Right Knee - Injections    Monovisc injection  . Left Foot - Follow-up      HPI: Patient presents for 2 separate issues #1 ulceration beneath the second metatarsal head left foot and osteoarthritis of the right knee. Patient has failed conservative treatment of the right knee and presents at this time for evaluation treatment with a moderate disc injection. Patient states that the ulcer on the plantar aspect of the left foot has broken open again.  Assessment & Plan: Visit Diagnoses:  1. Unilateral primary osteoarthritis, right knee   2. Ulcer of toe of left foot, with necrosis of bone (HCC)     Plan: Right knee was injected with monitored this without complications. Examination of the left foot shows an ulcer that goes down to the fascia over the joint of the MTP joint left foot second toe. We will start her on some doxycycline. Patient's report shows that she is allergic to all antibiotics but only has an itching reaction to doxycycline and she will use Benadryl if she needs it.  Follow-Up Instructions: Return in about 3 weeks (around 11/26/2016).   Ortho Exam  Patient is alert, oriented, no adenopathy, well-dressed, normal affect, normal respiratory effort. Examination patient has arthritic pain in the right knee there is no effusion no redness no signs of infection. Patient underwent a monitored this can injection. Examination of the left foot she is status post surgery to the second toe and metatarsal. She is ulcer beneath the second metatarsal head. This probes all the way down to fascia of the joint. There is no odor no drainage there is good granulation tissue the ulcer was 5 mm  in diameter and 5 mm deep.  Imaging: No results found.  Labs: Lab Results  Component Value Date   HGBA1C 5.3 02/24/2011   ESRSEDRATE 57 (H) 03/16/2014   REPTSTATUS 03/28/2013 FINAL 03/25/2013   CULT NO GROWTH Performed at [facility] 03/25/2013    Orders:  Orders Placed This Encounter  Procedures  . Large Joint Injection/Arthrocentesis   Meds ordered this encounter  Medications  . doxycycline (VIBRA-TABS) 100 MG tablet    Sig: Take 1 tablet (100 mg total) by mouth 2 (two) times daily.    Dispense:  60 tablet    Refill:  0     Procedures: Large Joint Inj Date/Time: 11/05/2016 1:54 PM Performed by: DUDA, MARCUS V Authorized by: Newt Minion   Consent Given by:  Patient Site marked: the procedure site was marked   Timeout: prior to procedure the correct patient, procedure, and site was verified   Indications:  Pain and diagnostic evaluation Location:  Knee Site:  R knee Prep: patient was prepped and draped in usual sterile fashion   Needle Size:  22 G Needle Length:  1.5 inches Approach:  Anteromedial Ultrasound Guidance: No   Fluoroscopic Guidance: No   Arthrogram: No   Medications:  5 mL lidocaine 1 %; 88 mg Hyaluronan 88 MG/4ML Aspiration Attempted: No   Patient tolerance:  Patient tolerated the procedure  well with no immediate complications    Clinical Data: No additional findings.  ROS:  All other systems negative, except as noted in the HPI. Review of Systems  Objective: Vital Signs: There were no vitals taken for this visit.  Specialty Comments:  No specialty comments available.  PMFS History: Patient Active Problem List   Diagnosis Date Noted  . Ulcer of toe of left foot, with necrosis of bone ([place]) 10/08/2016  . Non-pressure chronic ulcer of other part of left foot limited to breakdown of skin ([place]) 09/24/2016  . Acute asthma exacerbation 09/21/2016  . Allergic rhinitis 09/21/2016  . Anxiety 09/21/2016  . Arthritis 09/21/2016    . Breath shortness 09/21/2016  . Glaucoma 09/21/2016  . Adult hypothyroidism 09/21/2016  . Unilateral primary osteoarthritis, right knee 08/27/2016  . Bunion of great toe of right foot 06/12/2016  . Acquired hammer toe of left foot   . Irregular bowel habits 10/25/2014  . Cerumen impaction 09/26/2014  . Encounter for therapeutic drug monitoring 06/11/2014  . Essential hypertension 03/22/2014  . Wrist laceration 03/01/2014  . Primary open angle glaucoma of left eye, moderate stage 11/16/2013  . Primary open angle glaucoma of right eye, mild stage 11/16/2013  . Persistent atrial fibrillation ([place])   . Hyperlipidemia LDL goal <70   . Long term current use of anticoagulant therapy 09/24/2012  . CAD (coronary artery disease) 09/24/2012  . Transient cerebral ischemia 03/05/2011  . WEIGHT LOSS 08/23/2009  . CHANGE IN BOWELS 08/23/2009  . COLONIC POLYPS, ADENOMATOUS, HX OF 08/23/2009  . Anxiety state 07/02/2009  . Allergic rhinitis due to pollen 11/13/2008  . Major depressive disorder, single episode, moderate ([place]) 05/14/2008  . Sinus infection 07/13/2007  . Airway hyperreactivity 06/15/2007   Past Medical History:  Diagnosis Date  . Anxiety   . Arthritis   . CAD (coronary artery disease) 2007   a. 1997 s/p PCI of diagonal.  . Constipation   . COPD/Asthma   . Diverticulosis   . Esophageal reflux   . Family history of adverse reaction to anesthesia    daughter had trouble breathing after surgery, had to be reintubated  . Glaucoma   . HTN (hypertension), benign   . Hyperlipidemia   . Malignant melanoma ([place])    a. 09/2012 s/p resection.  . Neuropathy (HCC)    lower legs  . PAF (paroxysmal atrial fibrillation) (HCC)    a. intolerant to beta blockers due to asthma and intolerant to CCB due to rash;  b. CHA2DS2VASc = 6-->chronic coumadin;  c. 03/2014 Echo: EF 60-65%, Gr 1 DD, triv AI, mildly dil RV, PASP 26mmhg.  [name] TIA (transient ischemic attack)    a. 02/2011 - chronic coumadin  in setting of PAF.  [name] Urinary, incontinence, stress female     Family History  Problem Relation Age of Onset  . Heart disease Father   . Hemachromatosis Father   . Heart attack Father   . Colon cancer Mother   . Peripheral vascular disease Mother   . Peripheral vascular disease Sister   . Cancer Sister   . Lymphoma Sister   . Heart attack Brother   . Heart disease Brother     Past Surgical History:  Procedure Laterality Date  . ABDOMINAL HYSTERECTOMY    . APPENDECTOMY    . BACK SURGERY     lumbar laminectomy  . CHOLECYSTECTOMY    . COLONOSCOPY    . CORONARY ANGIOPLASTY WITH STENT PLACEMENT    . EYE SURGERY    .  NASAL SINUS SURGERY    . VESICOVAGINAL FISTULA CLOSURE W/ TAH    . WEIL OSTEOTOMY Left 06/12/2016   Procedure: Left Chevron and Aiken Left [name], Weil Osteotomy 2nd and 3rd Metatarsal;  Surgeon: Newt Minion, MD;  Location: [facility];  Service: Orthopedics;  Laterality: Left;   Social History   Occupational History  . 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    Social History Main Topics  . Smoking status: Former Smoker    Packs/day: 0.20    Years: 25.00    Types: Cigarettes    Quit date: 07/06/1966  . Smokeless tobacco: Never Used  . Alcohol use No  . Drug use: No  . Sexual activity: Not on file

## 2016-11-10 ENCOUNTER — Ambulatory Visit: Payer: Medicare HMO

## 2016-11-11 ENCOUNTER — Telehealth (INDEPENDENT_AMBULATORY_CARE_PROVIDER_SITE_OTHER): Payer: Self-pay | Admitting: Orthopedic Surgery

## 2016-11-11 ENCOUNTER — Other Ambulatory Visit (INDEPENDENT_AMBULATORY_CARE_PROVIDER_SITE_OTHER): Payer: Self-pay | Admitting: Family

## 2016-11-11 MED ORDER — CIPROFLOXACIN HCL 750 MG PO TABS: 750.0000 mg | ORAL_TABLET | Freq: Two times a day (BID) | ORAL | 0 refills | Status: DC

## 2016-11-11 NOTE — Telephone Encounter (Signed)
Patient called advised the antibiotic made her sick and itchy  (Doxycycline). Patient said she has a rash on her arms and legs. Patient said she stopped taking them yesterday. The number to contact patient is [phone]

## 2016-11-11 NOTE — Telephone Encounter (Signed)
Please advise what pt should take instead

## 2016-11-11 NOTE — Telephone Encounter (Signed)
Called pt to advise of below.

## 2016-11-12 ENCOUNTER — Other Ambulatory Visit: Payer: Self-pay | Admitting: Cardiology

## 2016-11-12 ENCOUNTER — Telehealth (INDEPENDENT_AMBULATORY_CARE_PROVIDER_SITE_OTHER): Payer: Self-pay | Admitting: Orthopedic Surgery

## 2016-11-12 NOTE — Telephone Encounter (Signed)
10/08/2016 OV NOTE FAXED TO [facility] RELATED TO THE RX DR DUDA SIGNED FOR KNEE BRACE

## 2016-11-13 ENCOUNTER — Telehealth (INDEPENDENT_AMBULATORY_CARE_PROVIDER_SITE_OTHER): Payer: Self-pay | Admitting: *Deleted

## 2016-11-13 ENCOUNTER — Telehealth (INDEPENDENT_AMBULATORY_CARE_PROVIDER_SITE_OTHER): Payer: Self-pay | Admitting: Orthopedic Surgery

## 2016-11-13 NOTE — Telephone Encounter (Signed)
Patients daughter called with a urgent request for a medication approval for Coumadin. Currently on the way to her doctor. CB # [phone]

## 2016-11-13 NOTE — Telephone Encounter (Signed)
I called and spoke with patients daughter. [facility] is needing approval for cipro since patient is on coumadin. Patient is getting INR checked at medical doctor now and they are managing this for her. Per MD patient can have cipro since she is now having INR checked.

## 2016-11-13 NOTE — Telephone Encounter (Signed)
[facility] at [place] called this afternoon in regards to a prescription that was sent to them for Cipro. This patient is currently on coumadin and they wanted to verify this was the correct medication for this patient to have since she os on blood thinners. Thank you CB # [phone].

## 2016-11-16 ENCOUNTER — Telehealth (INDEPENDENT_AMBULATORY_CARE_PROVIDER_SITE_OTHER): Payer: Self-pay | Admitting: Orthopedic Surgery

## 2016-11-16 NOTE — Telephone Encounter (Signed)
I called and spoke with Manus Gunning. She described of her high concern for her mother. She is fearful she will die from an infection in her foot. She states at time her mother can be condescending. She feels like her mother will not listen to the family and often drives others away. I apologized and advised that can be frustrating. I advised I would call the patient to see if we can see her this week after we check how her knee and how she is responding to antibiotic. Patient is agreeable of coming in this week to see Dr. Sharol Given at 200pm on Wednesday if she can find transportation.

## 2016-11-16 NOTE — Telephone Encounter (Signed)
I called and left voicemail for patients daughter. I took care of this last week. Patients daughter I believe told me to call [facility]. If we need to call [facility] as well. Just let me know.

## 2016-11-16 NOTE — Telephone Encounter (Signed)
Manus Gunning called wanting to speak with you about her mothers left foot. CB # [phone]

## 2016-11-18 ENCOUNTER — Encounter (INDEPENDENT_AMBULATORY_CARE_PROVIDER_SITE_OTHER): Payer: Self-pay | Admitting: Family

## 2016-11-18 ENCOUNTER — Ambulatory Visit (INDEPENDENT_AMBULATORY_CARE_PROVIDER_SITE_OTHER): Payer: Medicare HMO | Admitting: Family

## 2016-11-18 VITALS — Ht 67.0 in | Wt 133.0 lb

## 2016-11-18 DIAGNOSIS — L97524 Non-pressure chronic ulcer of other part of left foot with necrosis of bone: Secondary | ICD-10-CM | POA: Diagnosis not present

## 2016-11-18 DIAGNOSIS — M1711 Unilateral primary osteoarthritis, right knee: Secondary | ICD-10-CM | POA: Diagnosis not present

## 2016-11-19 ENCOUNTER — Telehealth: Payer: Self-pay | Admitting: Pulmonary Disease

## 2016-11-19 NOTE — Progress Notes (Signed)
Office Visit Note   Patient: [name]           Date of Birth: 1924-04-03           MRN: [id number] Visit Date: 11/18/2016             Requested by: Donald Prose, MD [place], [place] [address] PCP: Donald Prose, MD  Chief Complaint  Patient presents with  . Left Foot - Pain  . Right Knee - Pain      HPI: The patient is a 83 year old woman seen in follow up for 2 separate issues.   Ongoing osteoarthritis right knee. She had decreasing relief from cortisone injections in this knee and subsequently underwent hyaluronic acid injection in the right knee may 3rd. States has had little to no relief from this injection. Wonders what else can be done.   Also seen for ulceration to the left foot. Did have a Left Chevron and Aiken Left Great Toe, Weil Osteotomy 2nd and 3rd Metatarsal. Unfortunately the incision did dehisce and has been slow to heal. However today the wound has no depth. The daughter who accompanies the visit today has been concerned for her mother as she has been told of her chronic osteomyelitis to the 2nd metatarsal head.   Assessment & Plan: Visit Diagnoses:  1. Primary osteoarthritis of right knee   2. Ulcer of toe of left foot, with necrosis of bone ([place])     Plan: discussed TKA of the right knee. She is interested in continuing with conservative measures. Will continue with topical treatment. Have advised she continue with antibacterial ointment dressing changes daily to the left foot ulcer. Non weight bearing and offloading the forefoot on the left will be helpful for wound healing.   Follow-Up Instructions: Return in about 4 weeks (around 12/16/2016).   Ortho Exam  Patient is alert, oriented, no adenopathy, well-dressed, normal affect, normal respiratory effort. Examination patient has arthritic pain in the right knee there is no effusion no redness no signs of infection. Does have crepitation with ROM. Examination of the  left foot she is status post surgery to the second toe and metatarsal. She is ulcer beneath the second metatarsal head. This does not probe today. Is 5 mm in diameter. There is no odor no drainage there is proud granulation tissue. Imaging: No results found.  Labs: Lab Results  Component Value Date   HGBA1C 5.3 02/24/2011   ESRSEDRATE 57 (H) 03/16/2014   REPTSTATUS 03/28/2013 FINAL 03/25/2013   CULT NO GROWTH Performed at [facility] 03/25/2013    Orders:  No orders of the defined types were placed in this encounter.  No orders of the defined types were placed in this encounter.    Procedures: No procedures performed  Clinical Data: No additional findings.  ROS:  All other systems negative, except as noted in the HPI. Review of Systems  Constitutional: Negative for chills and fever.  Musculoskeletal: Positive for arthralgias. Negative for joint swelling.  Skin: Positive for wound. Negative for color change.    Objective: Vital Signs: Ht 5\' 7"  (1.702 m)   Wt 133 lb (60.3 kg)   BMI 20.83 kg/m   Specialty Comments:  No specialty comments available.  PMFS History: Patient Active Problem List   Diagnosis Date Noted  . Ulcer of toe of left foot, with necrosis of bone ([place]) 10/08/2016  . Non-pressure chronic ulcer of other part of left foot limited to breakdown  of skin ([place]) 09/24/2016  . Acute asthma exacerbation 09/21/2016  . Allergic rhinitis 09/21/2016  . Anxiety 09/21/2016  . Arthritis 09/21/2016  . Breath shortness 09/21/2016  . Glaucoma 09/21/2016  . Adult hypothyroidism 09/21/2016  . Unilateral primary osteoarthritis, right knee 08/27/2016  . Bunion of great toe of right foot 06/12/2016  . Acquired hammer toe of left foot   . Irregular bowel habits 10/25/2014  . Cerumen impaction 09/26/2014  . Encounter for therapeutic drug monitoring 06/11/2014  . Essential hypertension 03/22/2014  . Wrist laceration 03/01/2014  . Primary open angle glaucoma  of left eye, moderate stage 11/16/2013  . Primary open angle glaucoma of right eye, mild stage 11/16/2013  . Persistent atrial fibrillation ([place])   . Hyperlipidemia LDL goal <70   . Long term current use of anticoagulant therapy 09/24/2012  . CAD (coronary artery disease) 09/24/2012  . Transient cerebral ischemia 03/05/2011  . WEIGHT LOSS 08/23/2009  . CHANGE IN BOWELS 08/23/2009  . COLONIC POLYPS, ADENOMATOUS, HX OF 08/23/2009  . Anxiety state 07/02/2009  . Allergic rhinitis due to pollen 11/13/2008  . Major depressive disorder, single episode, moderate ([place]) 05/14/2008  . Sinus infection 07/13/2007  . Airway hyperreactivity 06/15/2007   Past Medical History:  Diagnosis Date  . Anxiety   . Arthritis   . CAD (coronary artery disease) 2007   a. 1997 s/p PCI of diagonal.  . Constipation   . COPD/Asthma   . Diverticulosis   . Esophageal reflux   . Family history of adverse reaction to anesthesia    daughter had trouble breathing after surgery, had to be reintubated  . Glaucoma   . HTN (hypertension), benign   . Hyperlipidemia   . Malignant melanoma ([place])    a. 09/2012 s/p resection.  . Neuropathy    lower legs  . PAF (paroxysmal atrial fibrillation) (HCC)    a. intolerant to beta blockers due to asthma and intolerant to CCB due to rash;  b. CHA2DS2VASc = 6-->chronic coumadin;  c. 03/2014 Echo: EF 60-65%, Gr 1 DD, triv AI, mildly dil RV, PASP 29mmhg.  [name] TIA (transient ischemic attack)    a. 02/2011 - chronic coumadin in setting of PAF.  [name] Urinary, incontinence, stress female     Family History  Problem Relation Age of Onset  . Heart disease Father   . Hemachromatosis Father   . Heart attack Father   . Colon cancer Mother   . Peripheral vascular disease Mother   . Peripheral vascular disease Sister   . Cancer Sister   . Lymphoma Sister   . Heart attack Brother   . Heart disease Brother     Past Surgical History:  Procedure Laterality Date  . ABDOMINAL HYSTERECTOMY      . APPENDECTOMY    . BACK SURGERY     lumbar laminectomy  . CHOLECYSTECTOMY    . COLONOSCOPY    . CORONARY ANGIOPLASTY WITH STENT PLACEMENT    . EYE SURGERY    . NASAL SINUS SURGERY    . VESICOVAGINAL FISTULA CLOSURE W/ TAH    . WEIL OSTEOTOMY Left 06/12/2016   Procedure: Left Chevron and Aiken Left [name], Weil Osteotomy 2nd and 3rd Metatarsal;  Surgeon: Newt Minion, MD;  Location: [facility];  Service: Orthopedics;  Laterality: Left;   Social History   Occupational History  . 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    Social History Main Topics  . Smoking status: Former Smoker    Packs/day:  0.20    Years: 25.00    Types: Cigarettes    Quit date: 07/06/1966  . Smokeless tobacco: Never Used  . Alcohol use No  . Drug use: No  . Sexual activity: Not on file

## 2016-11-23 ENCOUNTER — Ambulatory Visit (INDEPENDENT_AMBULATORY_CARE_PROVIDER_SITE_OTHER): Payer: Medicare HMO

## 2016-11-23 DIAGNOSIS — J4540 Moderate persistent asthma, uncomplicated: Secondary | ICD-10-CM | POA: Diagnosis not present

## 2016-11-23 MED ORDER — OMALIZUMAB 150 MG ~~LOC~~ SOLR
375.0000 mg | SUBCUTANEOUS | Status: DC
  Administered 2016-11-23: 375 mg via SUBCUTANEOUS

## 2016-11-24 ENCOUNTER — Telehealth: Payer: Self-pay | Admitting: Pulmonary Disease

## 2016-11-24 NOTE — Telephone Encounter (Signed)
Created in Error

## 2016-11-24 NOTE — Telephone Encounter (Signed)
#   vials:6 Ordered date:11/19/16 Forgot to put in epic. Shipping Date:11/23/16

## 2016-11-24 NOTE — Telephone Encounter (Signed)
#   Vials:6 Arrival Date:11/23/16 Lot #:[id number] Exp Date:10/21

## 2016-11-25 ENCOUNTER — Ambulatory Visit (INDEPENDENT_AMBULATORY_CARE_PROVIDER_SITE_OTHER): Payer: Medicare HMO | Admitting: Family

## 2016-12-10 ENCOUNTER — Ambulatory Visit (INDEPENDENT_AMBULATORY_CARE_PROVIDER_SITE_OTHER): Payer: Medicare HMO

## 2016-12-10 DIAGNOSIS — J4520 Mild intermittent asthma, uncomplicated: Secondary | ICD-10-CM | POA: Diagnosis not present

## 2016-12-11 MED ORDER — OMALIZUMAB 150 MG ~~LOC~~ SOLR
375.0000 mg | SUBCUTANEOUS | Status: DC
  Administered 2016-12-10: 375 mg via SUBCUTANEOUS

## 2016-12-11 NOTE — Progress Notes (Signed)
Documentation of medication administration of Xolair and charges have been completed by Lindsay Lemons, CMA based on the Xolair documentation sheet completed by Tammy Scott.  

## 2016-12-16 ENCOUNTER — Ambulatory Visit (INDEPENDENT_AMBULATORY_CARE_PROVIDER_SITE_OTHER): Payer: Medicare HMO | Admitting: Family

## 2016-12-24 ENCOUNTER — Ambulatory Visit (INDEPENDENT_AMBULATORY_CARE_PROVIDER_SITE_OTHER): Payer: Medicare HMO

## 2016-12-24 DIAGNOSIS — J4540 Moderate persistent asthma, uncomplicated: Secondary | ICD-10-CM

## 2016-12-25 MED ORDER — OMALIZUMAB 150 MG ~~LOC~~ SOLR
375.0000 mg | Freq: Once | SUBCUTANEOUS | Status: AC
  Administered 2016-12-24: 375 mg via SUBCUTANEOUS

## 2016-12-29 ENCOUNTER — Telehealth: Payer: Self-pay | Admitting: Pulmonary Disease

## 2016-12-30 ENCOUNTER — Telehealth: Payer: Self-pay | Admitting: Pulmonary Disease

## 2016-12-30 ENCOUNTER — Other Ambulatory Visit: Payer: Self-pay | Admitting: Pulmonary Disease

## 2016-12-30 NOTE — Telephone Encounter (Signed)
#   Vials:6 Arrival Date:12/30/16 Lot #:[id number] Exp Date:11/21

## 2016-12-30 NOTE — Telephone Encounter (Signed)
Created in error

## 2016-12-30 NOTE — Telephone Encounter (Signed)
#   vials:6 Ordered date:12/29/16 Shipping Date:12/29/16

## 2017-01-07 ENCOUNTER — Ambulatory Visit: Payer: Medicare HMO

## 2017-01-11 ENCOUNTER — Ambulatory Visit (INDEPENDENT_AMBULATORY_CARE_PROVIDER_SITE_OTHER): Payer: Medicare HMO

## 2017-01-11 DIAGNOSIS — J4520 Mild intermittent asthma, uncomplicated: Secondary | ICD-10-CM | POA: Diagnosis not present

## 2017-01-12 MED ORDER — OMALIZUMAB 150 MG ~~LOC~~ SOLR
375.0000 mg | SUBCUTANEOUS | Status: DC
  Administered 2017-01-11: 375 mg via SUBCUTANEOUS

## 2017-01-12 NOTE — Progress Notes (Signed)
Documentation of medication administration and charges of Xolair have been completed by Willow Shidler, CMA based on the Xolair documentation sheet completed by Tammy Scott.  

## 2017-01-27 ENCOUNTER — Ambulatory Visit (INDEPENDENT_AMBULATORY_CARE_PROVIDER_SITE_OTHER): Payer: Medicare HMO | Admitting: Pulmonary Disease

## 2017-01-27 ENCOUNTER — Encounter: Payer: Self-pay | Admitting: Pulmonary Disease

## 2017-01-27 ENCOUNTER — Ambulatory Visit (INDEPENDENT_AMBULATORY_CARE_PROVIDER_SITE_OTHER)
Admission: RE | Admit: 2017-01-27 | Discharge: 2017-01-27 | Disposition: A | Discharge status: Home / Self Care | Payer: Medicare HMO | Source: Ambulatory Visit | Attending: Pulmonary Disease | Admitting: Pulmonary Disease

## 2017-01-27 ENCOUNTER — Ambulatory Visit (INDEPENDENT_AMBULATORY_CARE_PROVIDER_SITE_OTHER): Payer: Medicare HMO

## 2017-01-27 VITALS — BP 118/62 | HR 75 | Ht 67.0 in | Wt 127.6 lb

## 2017-01-27 DIAGNOSIS — J4551 Severe persistent asthma with (acute) exacerbation: Secondary | ICD-10-CM

## 2017-01-27 DIAGNOSIS — R0602 Shortness of breath: Secondary | ICD-10-CM | POA: Diagnosis not present

## 2017-01-27 DIAGNOSIS — J4520 Mild intermittent asthma, uncomplicated: Secondary | ICD-10-CM | POA: Diagnosis not present

## 2017-01-27 MED ORDER — FLUTICASONE FUROATE-VILANTEROL 200-25 MCG/INH IN AEPB: 1.0000 | INHALATION_SPRAY | Freq: Every day | RESPIRATORY_TRACT | 0 refills | Status: DC

## 2017-01-27 MED ORDER — FLUTICASONE FUROATE-VILANTEROL 200-25 MCG/INH IN AEPB: 1.0000 | INHALATION_SPRAY | Freq: Every day | RESPIRATORY_TRACT | 11 refills | Status: AC

## 2017-01-27 MED ORDER — PREDNISONE 20 MG PO TABS: 20.0000 mg | ORAL_TABLET | Freq: Every day | ORAL | 0 refills | Status: DC

## 2017-01-27 NOTE — Addendum Note (Signed)
Addended by: Len Blalock on: 01/27/2017 02:04 PM   Modules accepted: Orders

## 2017-01-27 NOTE — Progress Notes (Signed)
Subjective:    Patient ID: [name], female    DOB: 04/18/1924, 83 y.o.   MRN: [id number]  Synopsis: Former patient of Dr. Gwenette Greet with asthma and COPD.  She started smoking as a child and smoked until age 73, but she smoked only about 1 cigarette per week.  She started having lung problems in 2000. PFT's 2009:  FEV1 1.23 (64%), ratio 57, DLCO 67%. No change with singulair or higher dose advair [place] 2006:  IgE 855, multiple significant allergens. On Xolair with good clinical response >> d/ced 08/2012 due to expense.  Cannot tolerate nebulized bronchodilators for maintenance +nasal polyps per ENT evaluation >> [facility].  Recommended surgery but pt declined. On topical nasal steroids.   Elevated Eos on CBC 08/2014  HPI Chief Complaint  Patient presents with  . Follow-up    pt c/o sob with exertion, sinus congestion, PND.  Pt tolerating xolair well.     She says she is feeling more short of breath: > started 1.5 weeks ago > worse with exertion, associated with fatigue > she says she hasn't felt good since she had a knee procedure by Dr. Sharol Given a few weeks back > she has worsening sinus symtpoms > she has bee coughing up mucus which she thinks is coming from her sinuses > she has not missed any Xolair > she has used more albuterol in the last 2 weeks, severeal times per week > she is still taking Breo   She had to take cipro for 30 days due to some sort of orthopedic infection.  Apparently her wound has healed.  She had a knee injection in May.    Past Medical History:  Diagnosis Date  . Anxiety   . Arthritis   . CAD (coronary artery disease) 2007   a. 1997 s/p PCI of diagonal.  . Constipation   . COPD/Asthma   . Diverticulosis   . Esophageal reflux   . Family history of adverse reaction to anesthesia    daughter had trouble breathing after surgery, had to be reintubated  . Glaucoma   . HTN (hypertension), benign   . Hyperlipidemia   . Malignant melanoma ([place])     a. 09/2012 s/p resection.  . Neuropathy    lower legs  . PAF (paroxysmal atrial fibrillation) (HCC)    a. intolerant to beta blockers due to asthma and intolerant to CCB due to rash;  b. CHA2DS2VASc = 6-->chronic coumadin;  c. 03/2014 Echo: EF 60-65%, Gr 1 DD, triv AI, mildly dil RV, PASP 52mmhg.  [name] TIA (transient ischemic attack)    a. 02/2011 - chronic coumadin in setting of PAF.  [name] Urinary, incontinence, stress female       Review of Systems  Constitutional: Negative for chills, fatigue and fever.  HENT: Positive for postnasal drip, rhinorrhea and sinus pressure.   Respiratory: Negative for cough, shortness of breath and wheezing.   Cardiovascular: Negative for chest pain, palpitations and leg swelling.       Objective:   Physical Exam Vitals:   01/27/17 1345  BP: 118/62  Pulse: 75  SpO2: 97%  Weight: 127 lb 9.6 oz (57.9 kg)  Height: 5\' 7"  (1.702 m)   RA  Gen: chronically ill appearing HENT: OP clear, TM's clear, neck supple PULM: Wheezing bilaterally B, normal percussion CV: RRR, no mgr, trace edema GI: BS+, soft, nontender Derm: no cyanosis or rash Psyche: normal mood and affect  CBC    Component Value Date/Time   WBC  8.7 06/15/2016 0521   RBC 3.44 (L) 06/15/2016 0521   HGB 10.7 (L) 06/15/2016 0521   HCT 31.2 (L) 06/15/2016 0521   PLT 157 06/15/2016 0521   MCV 90.7 06/15/2016 0521   MCH 31.1 06/15/2016 0521   MCHC 34.3 06/15/2016 0521   RDW 14.6 06/15/2016 0521   LYMPHSABS 2.5 03/22/2015 0908   MONOABS 0.6 03/22/2015 0908   EOSABS 0.9 (H) 03/22/2015 0908   BASOSABS 0.0 03/22/2015 0908     Orthopedic records reviewed were she has been treated with ciprofloxacin for a right foot infection recently     Assessment & Plan:  Shortness of breath - Plan: DG Chest 2 View  Severe persistent asthma with exacerbation   Discussion: Unfortunately [name] is having an exacerbation of her poorly controlled severe persistent asthma. I believe this is due to  the poor air quality that we have experienced recently. The differential diagnosis includes a cardiac process like heart failure but her physical exam does not support that today. Given the wheezing on physical exam a thin can asthma exacerbation is the most likely explanation.  Plan: For your shortness of breath: We will check a chest x-ray  For your poorly controlled severe persistent asthma with an acute exacerbation: Increase dose of Breo to 200 Take prednisone 20 mg daily 5 days Keep using albuterol as you are doing Keep taking Xolair as you are doing  We will see you back in 2 weeks with the nurse practitioner to make sure things are going in the right direction, if you're still having symptoms then then you may need to have a heart workup.    Current Outpatient Prescriptions:  .  acetaminophen (TYLENOL) 325 MG tablet, Take 325 mg by mouth every 6 (six) hours as needed for moderate pain. , Disp: , Rfl:  .  albuterol (PROAIR HFA) 108 (90 Base) MCG/ACT inhaler, Inhale 1-2 puffs into the lungs every 4 (four) hours as needed for wheezing or shortness of breath., Disp: 1 Inhaler, Rfl: 3 .  ALPRAZolam (XANAX) 0.25 MG tablet, Take 1 tablet (0.25 mg total) by mouth at bedtime as needed for anxiety., Disp: 30 tablet, Rfl: 0 .  amLODipine-valsartan (EXFORGE) 10-320 MG tablet, TAKE ONE TABLET BY MOUTH ONCE DAILY, Disp: 90 tablet, Rfl: 2 .  cetirizine (ZYRTEC) 10 MG tablet, Take 10 mg by mouth daily as needed for allergies., Disp: , Rfl:  .  Cholecalciferol (VITAMIN D) 2000 units tablet, Take 2,000 Units by mouth daily., Disp: , Rfl:  .  fluticasone (FLONASE) 50 MCG/ACT nasal spray, Place 1 spray into both nostrils daily as needed for allergies or rhinitis., Disp: 16 g, Rfl: 6 .  fluticasone furoate-vilanterol (BREO ELLIPTA) 100-25 MCG/INH AEPB, Inhale 1 puff into the lungs daily. (Patient taking differently: Inhale 1 puff into the lungs daily as needed. ), Disp: 30 each, Rfl: 5 .  furosemide  (LASIX) 40 MG tablet, Take 1 tablet (40 mg total) by mouth daily as needed for fluid., Disp: 30 tablet, Rfl: 10 .  latanoprost (XALATAN) 0.005 % ophthalmic solution, Place 1 drop into both eyes at bedtime., Disp: , Rfl:  .  Multiple Vitamins-Minerals (MULTIVITAMIN & MINERAL PO), Take 1 tablet by mouth daily., Disp: , Rfl:  .  pilocarpine (PILOCAR) 1 % ophthalmic solution, Place 1 drop into both eyes 2 times daily., Disp: , Rfl:  .  warfarin (COUMADIN) 2.5 MG tablet, Take 2.5-3.75 mg by mouth daily. Take 3.75 mg (1.5 tablets) on Sun, Tues, Weds, Thurs, Sat & Take  2.5 mg (1 tablet) on Mon & Fri, Disp: , Rfl:  .  XOLAIR 150 MG injection, INJECT 375 MG INTO THE SKIN EVERY 14 DAYS, Disp: 6 vial, Rfl: 11  Current Facility-Administered Medications:  .  omalizumab (XOLAIR) injection 375 mg, 375 mg, Subcutaneous, Q14 Days, Simonne Maffucci B, MD, 375 mg at 01/11/17 [phone]

## 2017-01-27 NOTE — Patient Instructions (Addendum)
For your shortness of breath: We will check a chest x-ray  For your poorly controlled severe persistent asthma with an acute exacerbation: Increase dose of Breo to 200 Take prednisone 20 mg daily 5 days Keep using albuterol as you are doing Keep taking Xolair as you are doing  We will see you back in 2 weeks with the nurse practitioner to make sure things are going in the right direction, if you're still having symptoms then then you may need to have a heart workup.

## 2017-01-27 NOTE — Addendum Note (Signed)
Addended by: Len Blalock on: 01/27/2017 02:08 PM   Modules accepted: Orders

## 2017-01-28 ENCOUNTER — Telehealth: Payer: Self-pay | Admitting: Pulmonary Disease

## 2017-01-28 MED ORDER — OMALIZUMAB 150 MG ~~LOC~~ SOLR
375.0000 mg | SUBCUTANEOUS | Status: DC
  Administered 2017-01-27: 375 mg via SUBCUTANEOUS

## 2017-01-28 NOTE — Progress Notes (Signed)
Documentation of medication administration and charges of Xolair have been completed by Lindsay Lemons, CMA based on the Xolair documentation sheet completed by Tammy Scott.  

## 2017-01-28 NOTE — Telephone Encounter (Addendum)
#   Vials:6 Arrival Date:01/28/17 Lot #:[id number] Exp Date:11/21 Pharm. didn't call to let us know that they were shipping med.. The pt. Told me when she came in for her shot. I would have told them not to send it b/c pt. Has a dose left.

## 2017-02-09 ENCOUNTER — Telehealth: Payer: Self-pay | Admitting: Pulmonary Disease

## 2017-02-09 NOTE — Telephone Encounter (Signed)
I called [employer] to put pt.'s xolair on hold. She has doses on hand. Will call [employer] and let them know when she needs more. Nothing further needed. Closing.

## 2017-02-17 ENCOUNTER — Ambulatory Visit (INDEPENDENT_AMBULATORY_CARE_PROVIDER_SITE_OTHER): Payer: Medicare HMO

## 2017-02-17 ENCOUNTER — Encounter: Payer: Self-pay | Admitting: Pulmonary Disease

## 2017-02-17 ENCOUNTER — Ambulatory Visit (INDEPENDENT_AMBULATORY_CARE_PROVIDER_SITE_OTHER): Payer: Medicare HMO | Admitting: Pulmonary Disease

## 2017-02-17 VITALS — BP 118/60 | HR 83 | Ht 66.5 in | Wt 128.0 lb

## 2017-02-17 DIAGNOSIS — J302 Other seasonal allergic rhinitis: Secondary | ICD-10-CM | POA: Diagnosis not present

## 2017-02-17 DIAGNOSIS — J4540 Moderate persistent asthma, uncomplicated: Secondary | ICD-10-CM | POA: Diagnosis not present

## 2017-02-17 DIAGNOSIS — J4550 Severe persistent asthma, uncomplicated: Secondary | ICD-10-CM

## 2017-02-17 NOTE — Progress Notes (Signed)
[facility]   Chief Complaint  Patient presents with  . Follow-up    2wk rov- Pt reports of sob with exertion, postnasal drip & occ non prod cough with clear mucus.     Primary Pulmonologist: Dr. Lake Bells   Current Outpatient Prescriptions on File Prior to Visit  Medication Sig  . acetaminophen (TYLENOL) 325 MG tablet Take 325 mg by mouth every 6 (six) hours as needed for moderate pain.   [name] albuterol (PROAIR HFA) 108 (90 Base) MCG/ACT inhaler Inhale 1-2 puffs into the lungs every 4 (four) hours as needed for wheezing or shortness of breath.  . ALPRAZolam (XANAX) 0.25 MG tablet Take 1 tablet (0.25 mg total) by mouth at bedtime as needed for anxiety.  [name] amLODipine-valsartan (EXFORGE) 10-320 MG tablet TAKE ONE TABLET BY MOUTH ONCE DAILY  . cetirizine (ZYRTEC) 10 MG tablet Take 10 mg by mouth daily as needed for allergies.  . Cholecalciferol (VITAMIN D) 2000 units tablet Take 2,000 Units by mouth daily.  . fluticasone (FLONASE) 50 MCG/ACT nasal spray Place 1 spray into both nostrils daily as needed for allergies or rhinitis.  . fluticasone furoate-vilanterol (BREO ELLIPTA) 200-25 MCG/INH AEPB Inhale 1 puff into the lungs daily.  [name] latanoprost (XALATAN) 0.005 % ophthalmic solution Place 1 drop into both eyes at bedtime.  . Multiple Vitamins-Minerals (MULTIVITAMIN & MINERAL PO) Take 1 tablet by mouth daily.  . pilocarpine (PILOCAR) 1 % ophthalmic solution Place 1 drop into both eyes 2 times daily.  [name] warfarin (COUMADIN) 2.5 MG tablet Take 2.5-3.75 mg by mouth daily. Take 3.75 mg (1.5 tablets) on Sun, Tues, Weds, Thurs, Sat & Take 2.5 mg (1 tablet) on Mon & Fri  . XOLAIR 150 MG injection INJECT 375 MG INTO THE SKIN EVERY 14 DAYS   Current Facility-Administered Medications on File Prior to Visit  Medication  . omalizumab [name]) injection 375 mg  . omalizumab [name]) injection 375 mg     Studies:    Past Medical Hx:  has a past medical history of Anxiety; Arthritis; CAD  (coronary artery disease) (2007); Constipation; COPD/Asthma; Diverticulosis; Esophageal reflux; Family history of adverse reaction to anesthesia; Glaucoma; HTN (hypertension), benign; Hyperlipidemia; Malignant melanoma ([place]); Neuropathy; PAF (paroxysmal atrial fibrillation) ([place]); TIA (transient ischemic attack); and Urinary, incontinence, stress female.   Past Surgical hx, Allergies, Family hx, Social hx all reviewed.  Vital Signs BP 118/60 (BP Location: Left Arm, Cuff Size: Normal)   Pulse 83   Ht 5' 6.5" (1.689 m)   Wt 128 lb (58.1 kg)   SpO2 97%   BMI 20.35 kg/m   History of Present Illness [name] is a 83 y.o. female, former smoker (began as a child, 1 cigarette per week, quit age 10) with PMH of asthma, COPD (PFT 2009 FEV1 1.23 / 64%, ratio 57, DLCO 67%) and multiple significant allergens (IgE 855 ) on Xolair who presented to the pulmonary office for routine follow up.  She does not tolerate nebulized bronchodilators.  Hx of nasal polyps per ENT evaluation but deferred surgical intervention [name]) due to anesthesia risk.  She is a retired Scientist, clinical (histocompatibility and immunogenetics).   She reports she has been doing well.  She occasionally has intermittent post nasal drip.  She denies cough, increased shortness of breath, increased LE swelling.  She reports her nasal drainage is intermittent, makes her hoarse, off/on all summer > uses flonase, zyrtec.  Has "spells" off / on with drainage but no worse than usual.  Sat outside on the deck with  her dog [name], part beagle) and she feels this made her symptoms worse.  She reports she is tolerating her Xolair.  Has not been using albuterol often > maybe once per month.  Breo upped the last time she saw Dr. Lake Bells and tolerating well.  Rinses after each use.  She states after last visit she took prednisone as prescribed > she reports Dr. Lake Bells "heard the asthma" but she didn't feel it.  Physical Exam  General - well developed adult F in no acute distress ENT  - No sinus tenderness, no oral exudate, no LAN Cardiac - s1s2 regular, no murmur Chest - even/non-labored, lungs bilaterally clear with good air movement. 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  She completed prednisone and reports feeling much better.  Denies new symptoms.    Asthma with Allergic Component   COPD   Plan: Continue Breo  Continue flonase, zyrtec  Continue PRN albuterol Xolair as ordered > tolerating well, had injection today (8/15) Follow up with Dr. Lake Bells in 6 months or if new or worsening symptoms    Patient Instructions  1.  Glad to hear you are doing so well! 2.  Continue your medications as prescribed 3.  Return to see Dr. Lake Bells in 6 months 4.  Call if new or worsening symptoms  5.  Follow up for your Xolair treatments as scheduled.     Janice Gens, NP-C [facility]  [phone] 02/17/2017, 9:37 PM

## 2017-02-17 NOTE — Patient Instructions (Signed)
1.  Glad to hear you are doing so well! 2.  Continue your medications as prescribed 3.  Return to see Dr. Lake Bells in 6 months 4.  Call if new or worsening symptoms  5.  Follow up for your Xolair treatments as scheduled.

## 2017-02-18 DIAGNOSIS — J4540 Moderate persistent asthma, uncomplicated: Secondary | ICD-10-CM

## 2017-02-18 MED ORDER — OMALIZUMAB 150 MG ~~LOC~~ SOLR
375.0000 mg | SUBCUTANEOUS | Status: DC
  Administered 2017-02-18: 375 mg via SUBCUTANEOUS

## 2017-02-18 NOTE — Progress Notes (Signed)
Documentation of medication administration and charges of Xolair have been completed by Lindsay Lemons, CMA based on the Xolair documentation sheet completed by Tammy Scott.  

## 2017-03-01 NOTE — Progress Notes (Signed)
Reviewed, agree 

## 2017-03-03 ENCOUNTER — Ambulatory Visit: Payer: Medicare HMO

## 2017-03-19 ENCOUNTER — Encounter: Payer: Self-pay | Admitting: Cardiology

## 2017-03-23 ENCOUNTER — Telehealth: Payer: Self-pay

## 2017-03-23 NOTE — Telephone Encounter (Signed)
Pt's last INR check in our coumadin clinic 01/23/16. Ov w/ Dr. Radford Pax 09/30/16 reports that pt is still taking coumadin. Attempted to contact pt to see who is monitoring INR and prescribing coumadin. Left message for pt to call back.

## 2017-03-23 NOTE — Telephone Encounter (Signed)
Telephoned pt and she states Dr Nancy Fetter her primary care doctor has been managing her coumadin and refilling her Coumadin.

## 2017-03-23 NOTE — Telephone Encounter (Signed)
Patient unable to travel no transportation  She is interested in home health checking coumadin

## 2017-03-24 ENCOUNTER — Telehealth: Payer: Self-pay | Admitting: Pulmonary Disease

## 2017-03-24 NOTE — Telephone Encounter (Signed)
Will forward to TS 

## 2017-03-24 NOTE — Telephone Encounter (Signed)
Spoke with pharmacy-they are aware that patient still has dose on hand and no reorder needed at this time.

## 2017-04-01 ENCOUNTER — Ambulatory Visit (INDEPENDENT_AMBULATORY_CARE_PROVIDER_SITE_OTHER): Payer: Medicare HMO | Admitting: Cardiology

## 2017-04-01 ENCOUNTER — Encounter: Payer: Self-pay | Admitting: Cardiology

## 2017-04-01 VITALS — BP 126/60 | HR 78 | Ht 66.5 in | Wt 128.6 lb

## 2017-04-01 DIAGNOSIS — I2510 Atherosclerotic heart disease of native coronary artery without angina pectoris: Secondary | ICD-10-CM

## 2017-04-01 DIAGNOSIS — E785 Hyperlipidemia, unspecified: Secondary | ICD-10-CM

## 2017-04-01 DIAGNOSIS — I10 Essential (primary) hypertension: Secondary | ICD-10-CM | POA: Diagnosis not present

## 2017-04-01 DIAGNOSIS — I481 Persistent atrial fibrillation: Secondary | ICD-10-CM

## 2017-04-01 DIAGNOSIS — I4819 Other persistent atrial fibrillation: Secondary | ICD-10-CM

## 2017-04-01 MED ORDER — IRBESARTAN 300 MG PO TABS: 300.0000 mg | ORAL_TABLET | Freq: Every day | ORAL | 11 refills | Status: DC

## 2017-04-01 MED ORDER — AMLODIPINE BESYLATE 10 MG PO TABS: 10.0000 mg | ORAL_TABLET | Freq: Every day | ORAL | 11 refills | Status: DC

## 2017-04-01 NOTE — Progress Notes (Signed)
Cardiology Office Note:    Date:  04/01/2017   ID:  [name], DOB 1924/01/27, MRN [id number]  PCP:  Donald Prose, MD  Cardiologist:  Fransico Him, MD   Referring MD: Donald Prose, MD   Chief Complaint  Patient presents with  . Follow-up    CAD/PAF    History of Present Illness:    [name] is a 83 y.o. female with a hx of ASCAD s/p remote PCI of the diag in 1997, persistent atrial fibrillation with CHADS2VASC score of 6 on chronic anticoagulation with Coumadin, HTN, hyperlipidemia.  She is here today for followup and is doing well.  She denies any chest pain or pressure, PND, orthopnea, dizziness, palpitations or syncope. She has chronic DOE with exertion but it is stable and does not occur on a daily basis.  She has chronic LE edema which is unchanged. She is compliant with her meds and is tolerating meds with no SE.    Past Medical History:  Diagnosis Date  . Anxiety   . Arthritis   . CAD (coronary artery disease) 2007   a. 1997 s/p PCI of diagonal.  . Constipation   . COPD/Asthma   . Diverticulosis   . Esophageal reflux   . Family history of adverse reaction to anesthesia    daughter had trouble breathing after surgery, had to be reintubated  . Glaucoma   . HTN (hypertension), benign   . Hyperlipidemia   . Malignant melanoma ([place])    a. 09/2012 s/p resection.  . Neuropathy    lower legs  . PAF (paroxysmal atrial fibrillation) ([facility])    a. intolerant to beta blockers due to asthma and intolerant to CCB due to rash;  b. CHA2DS2VASc = 6-->chronic coumadin;  c. 03/2014 Echo: EF 60-65%, Gr 1 DD, triv AI, mildly dil RV, PASP 54mmhg.  [name] TIA (transient ischemic attack)    a. 02/2011 - chronic coumadin in setting of PAF.  [name] Urinary, incontinence, stress female     Past Surgical History:  Procedure Laterality Date  . ABDOMINAL HYSTERECTOMY    . APPENDECTOMY    . BACK SURGERY     lumbar laminectomy  . CHOLECYSTECTOMY    . COLONOSCOPY    . CORONARY  ANGIOPLASTY WITH STENT PLACEMENT    . EYE SURGERY    . NASAL SINUS SURGERY    . VESICOVAGINAL FISTULA CLOSURE W/ TAH    . WEIL OSTEOTOMY Left 06/12/2016   Procedure: Left [place]ron and Aiken Left [name], Weil Osteotomy 2nd and 3rd Metatarsal;  Surgeon: Newt Minion, MD;  Location: [facility];  Service: Orthopedics;  Laterality: Left;    Current Medications: Current Meds  Medication Sig  . acetaminophen (TYLENOL) 325 MG tablet Take 325 mg by mouth every 6 (six) hours as needed for moderate pain.   [name] albuterol (PROAIR HFA) 108 (90 Base) MCG/ACT inhaler Inhale 1-2 puffs into the lungs every 4 (four) hours as needed for wheezing or shortness of breath.  . ALPRAZolam (XANAX) 0.25 MG tablet Take 1 tablet (0.25 mg total) by mouth at bedtime as needed for anxiety.  [name] amLODipine-valsartan (EXFORGE) 10-320 MG tablet TAKE ONE TABLET BY MOUTH ONCE DAILY  . cetirizine (ZYRTEC) 10 MG tablet Take 10 mg by mouth daily as needed for allergies.  . Cholecalciferol (VITAMIN D) 2000 units tablet Take 2,000 Units by mouth daily.  . fluticasone (FLONASE) 50 MCG/ACT nasal spray Place 1 spray into both nostrils daily as needed for allergies or rhinitis.  [name]  fluticasone furoate-vilanterol (BREO ELLIPTA) 200-25 MCG/INH AEPB Inhale 1 puff into the lungs daily.  . furosemide (LASIX) 40 MG tablet Take 40 mg by mouth as needed.  . latanoprost (XALATAN) 0.005 % ophthalmic solution Place 1 drop into both eyes at bedtime.  . Multiple Vitamins-Minerals (MULTIVITAMIN & MINERAL PO) Take 1 tablet by mouth daily.  . pilocarpine (PILOCAR) 1 % ophthalmic solution Place 1 drop into both eyes 2 times daily.  [name] warfarin (COUMADIN) 2.5 MG tablet Take 2.5-3.75 mg by mouth daily. Take 3.75 mg (1.5 tablets) on Sun, Tues, Weds, Thurs, Sat & Take 2.5 mg (1 tablet) on Mon & Fri  . XOLAIR 150 MG injection INJECT 375 MG INTO THE SKIN EVERY 14 DAYS   Current Facility-Administered Medications for the 04/01/17 encounter (Office Visit) with Sueanne Margarita, MD  Medication  . omalizumab [name]) injection 375 mg  . omalizumab [name]) injection 375 mg  . omalizumab [name]) injection 375 mg     Allergies:   Alphagan [brimonidine]; Apraclonidine; Biaxin [clarithromycin]; Brovana [arformoterol]; Budesonide; Cefdinir; Cephalexin; Cephalexin; Cifenline; Clonidine; Clonidine derivatives; Dorzolamide; Iohexol; Nsaids; Penicillins; Sulfamethoxazole; Sulfonamide derivatives; Tetracycline; Timolol; Vancomycin; Zetia [ezetimibe]; and Cardizem [diltiazem hcl]   Social History   Social History  . Marital status: Divorced    Spouse name: N/A  . Number of children: N/A  . Years of education: N/A   Occupational History  . 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    Social History Main Topics  . Smoking status: Former Smoker    Packs/day: 0.20    Years: 25.00    Types: Cigarettes    Quit date: 07/06/1966  . Smokeless tobacco: Never Used  . Alcohol use No  . Drug use: No  . Sexual activity: Not Asked   Other Topics Concern  . None   Social History Narrative   Lives in [place] with her dog.  She is very independent.     Family History: The patient's family history includes Cancer in her sister; Colon cancer in her mother; Heart attack in her brother and father; Heart disease in her brother and father; Hemachromatosis in her father; Lymphoma in her sister; Peripheral vascular disease in her mother and sister.  ROS:   Please see the history of present illness.    ROS  All other systems reviewed and negative.   EKGs/Labs/Other Studies Reviewed:    The following studies were reviewed today: none   EKG:  EKG is ordered today and shows NSR at 81bpm with PACs and no ST changes  Recent Labs: 06/12/2016: BUN 17; Creatinine, Ser 0.87; Potassium 3.9; Sodium 137 06/15/2016: Hemoglobin 10.7; Platelets 157   Recent Lipid Panel    Component Value Date/Time   CHOL 134 11/20/2015 1144   TRIG 64 11/20/2015 1144   HDL 76 11/20/2015 1144    CHOLHDL 1.8 11/20/2015 1144   VLDL 13 11/20/2015 1144   LDLCALC 45 11/20/2015 1144    Physical Exam:    VS:  BP 126/60   Pulse 78   Ht 5' 6.5" (1.689 m)   Wt 128 lb 9.6 oz (58.3 kg)   SpO2 97%   BMI 20.45 kg/m     Wt Readings from Last 3 Encounters:  04/01/17 128 lb 9.6 oz (58.3 kg)  02/17/17 128 lb (58.1 kg)  01/27/17 127 lb 9.6 oz (57.9 kg)     GEN:  Well nourished, well developed in no acute distress HEENT: Normal NECK: No JVD; No carotid bruits LYMPHATICS: No lymphadenopathy CARDIAC:  RRR, no murmurs, rubs, gallops RESPIRATORY:  Clear to auscultation without rales, wheezing or rhonchi  ABDOMEN: Soft, non-tender, non-distended MUSCULOSKELETAL:  No edema; No deformity  SKIN: Warm and dry NEUROLOGIC:  Alert and oriented x 3 PSYCHIATRIC:  Normal affect   ASSESSMENT:    1. Coronary artery disease involving native coronary artery of native heart without angina pectoris   2. Persistent atrial fibrillation ([place])   3. Essential hypertension   4. Hyperlipidemia LDL goal <70    PLAN:    In order of problems listed above:  1.  ASCAD - s/p remote PCI of the diag in 1997.  She has no anginal symptoms.  She is not on ASA due to warfarin.  She is statin intolerant.    2.  Persistent atrial fibrillation - maintaining NSR with PACs.  She will continue on warfarin.    3.  HTN - BP is well controlled on current meds.  She is currently on Exforge which I will stop due to the issues with Valsartan.  Will start Amlodipine 10mg  daily and Avapro 300mg  daily.  I will have her f/u in HTN clinic in 1 week.   4.  Hyperlipidemia with LDL goal < 70.  She is statin intolerant.     Medication Adjustments/Labs and Tests Ordered: Current medicines are reviewed at length with the patient today.  Concerns regarding medicines are outlined above.  No orders of the defined types were placed in this encounter.  No orders of the defined types were placed in this encounter.   Signed, Fransico Him, MD  04/01/2017 11:22 AM    [place]

## 2017-04-01 NOTE — Patient Instructions (Addendum)
Medication Instructions:  Please stop Exforge Start Amlodipine 10 mg a day and Avapro 300 mg a day. Continue all other medications as listed.  Follow-Up: Please see the Hypertension Clinic in 1 to 2 weeks to follow up on medication changes.  Follow up in 6 months with Dr. Radford Pax.  You will receive a letter in the mail 2 months before you are due.  Please call us when you receive this letter to schedule your follow up appointment.  If you need a refill on your cardiac medications before your next appointment, please call your pharmacy.  Thank you for choosing [facility]!!

## 2017-04-18 ENCOUNTER — Emergency Department (HOSPITAL_COMMUNITY): Payer: Medicare HMO

## 2017-04-18 ENCOUNTER — Inpatient Hospital Stay (HOSPITAL_COMMUNITY)
Admission: EM | Admit: 2017-04-18 | Discharge: 2017-04-23 | DRG: 481 | Disposition: A | Discharge status: Home Health | Payer: Medicare HMO | Attending: Internal Medicine | Admitting: Internal Medicine

## 2017-04-18 ENCOUNTER — Encounter (HOSPITAL_COMMUNITY): Payer: Self-pay

## 2017-04-18 DIAGNOSIS — Z807 Family history of other malignant neoplasms of lymphoid, hematopoietic and related tissues: Secondary | ICD-10-CM | POA: Diagnosis not present

## 2017-04-18 DIAGNOSIS — Z800 Family history of malignant neoplasm of digestive organs: Secondary | ICD-10-CM

## 2017-04-18 DIAGNOSIS — Z882 Allergy status to sulfonamides status: Secondary | ICD-10-CM | POA: Diagnosis not present

## 2017-04-18 DIAGNOSIS — Z85820 Personal history of malignant melanoma of skin: Secondary | ICD-10-CM | POA: Diagnosis not present

## 2017-04-18 DIAGNOSIS — S72012A Unspecified intracapsular fracture of left femur, initial encounter for closed fracture: Principal | ICD-10-CM | POA: Diagnosis present

## 2017-04-18 DIAGNOSIS — I2510 Atherosclerotic heart disease of native coronary artery without angina pectoris: Secondary | ICD-10-CM | POA: Diagnosis present

## 2017-04-18 DIAGNOSIS — S7292XA Unspecified fracture of left femur, initial encounter for closed fracture: Secondary | ICD-10-CM | POA: Diagnosis present

## 2017-04-18 DIAGNOSIS — Y93K1 Activity, walking an animal: Secondary | ICD-10-CM

## 2017-04-18 DIAGNOSIS — G629 Polyneuropathy, unspecified: Secondary | ICD-10-CM | POA: Diagnosis present

## 2017-04-18 DIAGNOSIS — W010XXA Fall on same level from slipping, tripping and stumbling without subsequent striking against object, initial encounter: Secondary | ICD-10-CM | POA: Diagnosis not present

## 2017-04-18 DIAGNOSIS — I10 Essential (primary) hypertension: Secondary | ICD-10-CM | POA: Diagnosis present

## 2017-04-18 DIAGNOSIS — Z90710 Acquired absence of both cervix and uterus: Secondary | ICD-10-CM

## 2017-04-18 DIAGNOSIS — H409 Unspecified glaucoma: Secondary | ICD-10-CM | POA: Diagnosis present

## 2017-04-18 DIAGNOSIS — W19XXXA Unspecified fall, initial encounter: Secondary | ICD-10-CM

## 2017-04-18 DIAGNOSIS — S72009A Fracture of unspecified part of neck of unspecified femur, initial encounter for closed fracture: Secondary | ICD-10-CM | POA: Diagnosis not present

## 2017-04-18 DIAGNOSIS — Z8249 Family history of ischemic heart disease and other diseases of the circulatory system: Secondary | ICD-10-CM | POA: Diagnosis not present

## 2017-04-18 DIAGNOSIS — J4550 Severe persistent asthma, uncomplicated: Secondary | ICD-10-CM | POA: Diagnosis present

## 2017-04-18 DIAGNOSIS — I4819 Other persistent atrial fibrillation: Secondary | ICD-10-CM | POA: Diagnosis present

## 2017-04-18 DIAGNOSIS — S72002A Fracture of unspecified part of neck of left femur, initial encounter for closed fracture: Secondary | ICD-10-CM | POA: Diagnosis not present

## 2017-04-18 DIAGNOSIS — Z7901 Long term (current) use of anticoagulants: Secondary | ICD-10-CM | POA: Diagnosis not present

## 2017-04-18 DIAGNOSIS — M1990 Unspecified osteoarthritis, unspecified site: Secondary | ICD-10-CM | POA: Diagnosis present

## 2017-04-18 DIAGNOSIS — Y92009 Unspecified place in unspecified non-institutional (private) residence as the place of occurrence of the external cause: Secondary | ICD-10-CM

## 2017-04-18 DIAGNOSIS — K219 Gastro-esophageal reflux disease without esophagitis: Secondary | ICD-10-CM | POA: Diagnosis present

## 2017-04-18 DIAGNOSIS — I481 Persistent atrial fibrillation: Secondary | ICD-10-CM | POA: Diagnosis present

## 2017-04-18 DIAGNOSIS — Z880 Allergy status to penicillin: Secondary | ICD-10-CM | POA: Diagnosis not present

## 2017-04-18 DIAGNOSIS — Z91041 Radiographic dye allergy status: Secondary | ICD-10-CM

## 2017-04-18 DIAGNOSIS — Z419 Encounter for procedure for purposes other than remedying health state, unspecified: Secondary | ICD-10-CM

## 2017-04-18 DIAGNOSIS — E039 Hypothyroidism, unspecified: Secondary | ICD-10-CM | POA: Diagnosis present

## 2017-04-18 DIAGNOSIS — Z23 Encounter for immunization: Secondary | ICD-10-CM | POA: Diagnosis present

## 2017-04-18 DIAGNOSIS — F419 Anxiety disorder, unspecified: Secondary | ICD-10-CM | POA: Diagnosis present

## 2017-04-18 DIAGNOSIS — S7290XA Unspecified fracture of unspecified femur, initial encounter for closed fracture: Secondary | ICD-10-CM

## 2017-04-18 DIAGNOSIS — Z881 Allergy status to other antibiotic agents status: Secondary | ICD-10-CM

## 2017-04-18 DIAGNOSIS — Z888 Allergy status to other drugs, medicaments and biological substances status: Secondary | ICD-10-CM

## 2017-04-18 DIAGNOSIS — Z8673 Personal history of transient ischemic attack (TIA), and cerebral infarction without residual deficits: Secondary | ICD-10-CM | POA: Diagnosis not present

## 2017-04-18 DIAGNOSIS — Z87891 Personal history of nicotine dependence: Secondary | ICD-10-CM | POA: Diagnosis not present

## 2017-04-18 DIAGNOSIS — I480 Paroxysmal atrial fibrillation: Secondary | ICD-10-CM | POA: Diagnosis present

## 2017-04-18 DIAGNOSIS — Z955 Presence of coronary angioplasty implant and graft: Secondary | ICD-10-CM

## 2017-04-18 LAB — COMPREHENSIVE METABOLIC PANEL
ALBUMIN: 3.7 g/dL (ref 3.5–5.0)
ALT: 18 U/L (ref 14–54)
AST: 25 U/L (ref 15–41)
Alkaline Phosphatase: 45 U/L (ref 38–126)
Anion gap: 9 (ref 5–15)
BILIRUBIN TOTAL: 0.8 mg/dL (ref 0.3–1.2)
BUN: 21 mg/dL — AB (ref 6–20)
CHLORIDE: 101 mmol/L (ref 101–111)
CO2: 23 mmol/L (ref 22–32)
CREATININE: 0.85 mg/dL (ref 0.44–1.00)
Calcium: 8.6 mg/dL — ABNORMAL LOW (ref 8.9–10.3)
GFR calc Af Amer: >60 mL/min (ref >60)
GFR, EST NON AFRICAN AMERICAN: 58 mL/min — AB (ref >60)
GLUCOSE: 128 mg/dL — AB (ref 65–99)
POTASSIUM: 3.7 mmol/L (ref 3.5–5.1)
Sodium: 133 mmol/L — ABNORMAL LOW (ref 135–145)
Total Protein: 6.6 g/dL (ref 6.5–8.1)

## 2017-04-18 LAB — CBC WITH DIFFERENTIAL/PLATELET
BASOS PCT: 0 %
Basophils Absolute: 0 10*3/uL (ref 0.0–0.1)
Eosinophils Absolute: 0.1 10*3/uL (ref 0.0–0.7)
Eosinophils Relative: 1 %
HEMATOCRIT: 35.2 % — AB (ref 36.0–46.0)
Hemoglobin: 12 g/dL (ref 12.0–15.0)
LYMPHS PCT: 11 %
Lymphs Abs: 1.2 10*3/uL (ref 0.7–4.0)
MCH: 30.8 pg (ref 26.0–34.0)
MCHC: 34.1 g/dL (ref 30.0–36.0)
MCV: 90.3 fL (ref 78.0–100.0)
MONO ABS: 0.5 10*3/uL (ref 0.1–1.0)
Monocytes Relative: 4 %
NEUTROS ABS: 9.9 10*3/uL — AB (ref 1.7–7.7)
Neutrophils Relative %: 84 %
PLATELETS: 123 10*3/uL — AB (ref 150–400)
RBC: 3.9 MIL/uL (ref 3.87–5.11)
RDW: 15.4 % (ref 11.5–15.5)
WBC: 11.8 10*3/uL — AB (ref 4.0–10.5)

## 2017-04-18 LAB — PROTIME-INR
INR: 1.61
Prothrombin Time: 19 seconds — ABNORMAL HIGH (ref 11.4–15.2)

## 2017-04-18 LAB — TYPE AND SCREEN
ABO/RH(D): A POS
ANTIBODY SCREEN: NEGATIVE

## 2017-04-18 MED ORDER — ALPRAZOLAM 0.5 MG PO TABS
0.2500 mg | ORAL_TABLET | Freq: Once | ORAL | Status: AC
  Administered 2017-04-18: 0.25 mg via ORAL
  Filled 2017-04-18: qty 1

## 2017-04-18 NOTE — ED Notes (Signed)
Nurse collected labs. 

## 2017-04-18 NOTE — ED Notes (Signed)
Patient transported to X-ray 
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
## 2017-04-18 NOTE — ED Notes (Signed)
Pt back from x-ray.

## 2017-04-18 NOTE — ED Notes (Signed)
Patient's family updated on POC and will be leaving shortly.

## 2017-04-19 ENCOUNTER — Inpatient Hospital Stay (HOSPITAL_COMMUNITY): Payer: Medicare HMO | Admitting: Certified Registered"

## 2017-04-19 ENCOUNTER — Encounter (HOSPITAL_COMMUNITY)
Admission: EM | Disposition: A | Discharge status: Home Health | Payer: Self-pay | Source: Home / Self Care | Attending: Family Medicine

## 2017-04-19 ENCOUNTER — Inpatient Hospital Stay (HOSPITAL_COMMUNITY): Payer: Medicare HMO

## 2017-04-19 ENCOUNTER — Encounter (HOSPITAL_COMMUNITY): Payer: Self-pay | Admitting: Internal Medicine

## 2017-04-19 DIAGNOSIS — S72009A Fracture of unspecified part of neck of unspecified femur, initial encounter for closed fracture: Secondary | ICD-10-CM | POA: Diagnosis present

## 2017-04-19 LAB — SURGICAL PCR SCREEN
MRSA, PCR: NEGATIVE
Staphylococcus aureus: NEGATIVE

## 2017-04-19 LAB — CBC
HCT: 32.5 % — ABNORMAL LOW (ref 36.0–46.0)
HEMOGLOBIN: 10.8 g/dL — AB (ref 12.0–15.0)
MCH: 30.5 pg (ref 26.0–34.0)
MCHC: 33.2 g/dL (ref 30.0–36.0)
MCV: 91.8 fL (ref 78.0–100.0)
PLATELETS: 154 10*3/uL (ref 150–400)
RBC: 3.54 MIL/uL — AB (ref 3.87–5.11)
RDW: 15.3 % (ref 11.5–15.5)
WBC: 11.4 10*3/uL — AB (ref 4.0–10.5)

## 2017-04-19 LAB — BASIC METABOLIC PANEL
ANION GAP: 6 (ref 5–15)
BUN: 18 mg/dL (ref 6–20)
CHLORIDE: 104 mmol/L (ref 101–111)
CO2: 27 mmol/L (ref 22–32)
Calcium: 8.5 mg/dL — ABNORMAL LOW (ref 8.9–10.3)
Creatinine, Ser: 0.71 mg/dL (ref 0.44–1.00)
GFR calc Af Amer: >60 mL/min (ref >60)
GLUCOSE: 109 mg/dL — AB (ref 65–99)
POTASSIUM: 4 mmol/L (ref 3.5–5.1)
SODIUM: 137 mmol/L (ref 135–145)

## 2017-04-19 LAB — ABO/RH: ABO/RH(D): A POS

## 2017-04-19 LAB — PROTIME-INR
INR: 1.43
PROTHROMBIN TIME: 17.3 s — AB (ref 11.4–15.2)

## 2017-04-19 SURGERY — CLOSED REDUCTION, PELVIS, WITH PERCUTANEOUS FIXATION
Anesthesia: General | Site: Hip | Laterality: Left

## 2017-04-19 MED ORDER — LIDOCAINE HCL (CARDIAC) 20 MG/ML IV SOLN
INTRAVENOUS | Status: DC | PRN
  Administered 2017-04-19: 100 mg via INTRAVENOUS

## 2017-04-19 MED ORDER — ALBUMIN HUMAN 5 % IV SOLN
INTRAVENOUS | Status: DC | PRN
  Administered 2017-04-19: 16:00:00 via INTRAVENOUS

## 2017-04-19 MED ORDER — FLUTICASONE FUROATE-VILANTEROL 200-25 MCG/INH IN AEPB
1.0000 | INHALATION_SPRAY | Freq: Every day | RESPIRATORY_TRACT | Status: DC
  Administered 2017-04-20 – 2017-04-23 (×4): 1 via RESPIRATORY_TRACT
  Filled 2017-04-19 (×2): qty 28

## 2017-04-19 MED ORDER — AMLODIPINE BESYLATE 10 MG PO TABS
10.0000 mg | ORAL_TABLET | Freq: Every day | ORAL | Status: DC
  Administered 2017-04-20 – 2017-04-22 (×3): 10 mg via ORAL
  Filled 2017-04-19 (×5): qty 1

## 2017-04-19 MED ORDER — ENSURE ENLIVE PO LIQD
237.0000 mL | Freq: Two times a day (BID) | ORAL | Status: DC
  Administered 2017-04-20 – 2017-04-22 (×6): 237 mL via ORAL

## 2017-04-19 MED ORDER — ONDANSETRON HCL 4 MG/2ML IJ SOLN: 4.0000 mg | Freq: Once | INTRAMUSCULAR | Status: DC | PRN

## 2017-04-19 MED ORDER — MORPHINE SULFATE (PF) 4 MG/ML IV SOLN
0.5000 mg | INTRAVENOUS | Status: DC | PRN
  Administered 2017-04-19 – 2017-04-20 (×2): 0.52 mg via INTRAVENOUS
  Filled 2017-04-19 (×2): qty 1

## 2017-04-19 MED ORDER — PROPOFOL 10 MG/ML IV BOLUS
INTRAVENOUS | Status: AC
  Filled 2017-04-19: qty 20

## 2017-04-19 MED ORDER — SUGAMMADEX SODIUM 200 MG/2ML IV SOLN
INTRAVENOUS | Status: DC | PRN
  Administered 2017-04-19: 125 mg via INTRAVENOUS

## 2017-04-19 MED ORDER — PILOCARPINE HCL 1 % OP SOLN
1.0000 [drp] | Freq: Two times a day (BID) | OPHTHALMIC | Status: DC
  Administered 2017-04-19: 1 [drp] via OPHTHALMIC
  Filled 2017-04-19: qty 15

## 2017-04-19 MED ORDER — DEXAMETHASONE SODIUM PHOSPHATE 4 MG/ML IJ SOLN
INTRAMUSCULAR | Status: DC | PRN
  Administered 2017-04-19: 8 mg via INTRAVENOUS

## 2017-04-19 MED ORDER — ALPRAZOLAM 0.25 MG PO TABS
0.2500 mg | ORAL_TABLET | Freq: Every evening | ORAL | Status: DC | PRN
  Administered 2017-04-19: 0.25 mg via ORAL
  Filled 2017-04-19: qty 1

## 2017-04-19 MED ORDER — LATANOPROST 0.005 % OP SOLN
1.0000 [drp] | Freq: Every day | OPHTHALMIC | Status: DC
  Administered 2017-04-19 – 2017-04-22 (×5): 1 [drp] via OPHTHALMIC
  Filled 2017-04-19: qty 2.5

## 2017-04-19 MED ORDER — ROCURONIUM BROMIDE 100 MG/10ML IV SOLN
INTRAVENOUS | Status: DC | PRN
  Administered 2017-04-19: 30 mg via INTRAVENOUS

## 2017-04-19 MED ORDER — IRBESARTAN 300 MG PO TABS
300.0000 mg | ORAL_TABLET | Freq: Every day | ORAL | Status: DC
  Administered 2017-04-20 – 2017-04-22 (×3): 300 mg via ORAL
  Filled 2017-04-19 (×3): qty 1

## 2017-04-19 MED ORDER — VITAMIN D 1000 UNITS PO TABS
2000.0000 [IU] | ORAL_TABLET | Freq: Every day | ORAL | Status: DC
  Administered 2017-04-20 – 2017-04-22 (×3): 2000 [IU] via ORAL
  Filled 2017-04-19 (×5): qty 2

## 2017-04-19 MED ORDER — 0.9 % SODIUM CHLORIDE (POUR BTL) OPTIME
TOPICAL | Status: DC | PRN
  Administered 2017-04-19: 1000 mL

## 2017-04-19 MED ORDER — LACTATED RINGERS IV SOLN
INTRAVENOUS | Status: DC | PRN
  Administered 2017-04-19: 16:00:00 via INTRAVENOUS

## 2017-04-19 MED ORDER — PHENYLEPHRINE HCL 10 MG/ML IJ SOLN
INTRAVENOUS | Status: DC | PRN
  Administered 2017-04-19: 50 ug/min via INTRAVENOUS

## 2017-04-19 MED ORDER — OXYCODONE HCL 5 MG/5ML PO SOLN: 5.0000 mg | Freq: Once | ORAL | Status: DC | PRN

## 2017-04-19 MED ORDER — LACTATED RINGERS IV SOLN
INTRAVENOUS | Status: DC
Start: 2017-04-19 — End: 2017-04-23
  Administered 2017-04-20: 05:00:00 via INTRAVENOUS

## 2017-04-19 MED ORDER — CEFAZOLIN SODIUM-DEXTROSE 2-4 GM/100ML-% IV SOLN
INTRAVENOUS | Status: AC
  Filled 2017-04-19: qty 100

## 2017-04-19 MED ORDER — FENTANYL CITRATE (PF) 100 MCG/2ML IJ SOLN
INTRAMUSCULAR | Status: DC | PRN
  Administered 2017-04-19: 50 ug via INTRAVENOUS
  Administered 2017-04-19: 100 ug via INTRAVENOUS

## 2017-04-19 MED ORDER — FENTANYL CITRATE (PF) 250 MCG/5ML IJ SOLN
INTRAMUSCULAR | Status: AC
  Filled 2017-04-19: qty 5

## 2017-04-19 MED ORDER — PROPOFOL 10 MG/ML IV BOLUS
INTRAVENOUS | Status: DC | PRN
  Administered 2017-04-19: 110 mg via INTRAVENOUS

## 2017-04-19 MED ORDER — OXYCODONE HCL 5 MG PO TABS: 5.0000 mg | ORAL_TABLET | Freq: Once | ORAL | Status: DC | PRN

## 2017-04-19 MED ORDER — FENTANYL CITRATE (PF) 100 MCG/2ML IJ SOLN: 25.0000 ug | INTRAMUSCULAR | Status: DC | PRN

## 2017-04-19 MED ORDER — FLUTICASONE PROPIONATE 50 MCG/ACT NA SUSP: 1.0000 | Freq: Every day | NASAL | Status: DC | PRN

## 2017-04-19 MED ORDER — ALBUTEROL SULFATE (2.5 MG/3ML) 0.083% IN NEBU: 2.5000 mg | INHALATION_SOLUTION | RESPIRATORY_TRACT | Status: DC | PRN

## 2017-04-19 MED ORDER — PHENYLEPHRINE HCL 10 MG/ML IJ SOLN
INTRAMUSCULAR | Status: DC | PRN
  Administered 2017-04-19: 80 ug via INTRAVENOUS

## 2017-04-19 MED ORDER — INFLUENZA VAC SPLIT HIGH-DOSE 0.5 ML IM SUSY
0.5000 mL | PREFILLED_SYRINGE | INTRAMUSCULAR | Status: DC
  Filled 2017-04-19: qty 0.5

## 2017-04-19 MED ORDER — ONDANSETRON HCL 4 MG/2ML IJ SOLN
INTRAMUSCULAR | Status: DC | PRN
  Administered 2017-04-19: 4 mg via INTRAVENOUS

## 2017-04-19 MED ORDER — LACTATED RINGERS IV SOLN
INTRAVENOUS | Status: DC
  Administered 2017-04-19: 15:00:00 via INTRAVENOUS

## 2017-04-19 MED ORDER — CEFAZOLIN SODIUM-DEXTROSE 2-4 GM/100ML-% IV SOLN
2.0000 g | INTRAVENOUS | Status: AC
  Administered 2017-04-19: 2 g via INTRAVENOUS

## 2017-04-19 SURGICAL SUPPLY — 43 items
ADH SKN CLS APL DERMABOND .7 (GAUZE/BANDAGES/DRESSINGS) ×1
BIT DRILL CANN LRG QC 5X300 (BIT) ×2 IMPLANT
BLADE CLIPPER SURG (BLADE) IMPLANT
BLADE SURG 11 STRL SS (BLADE) ×1 IMPLANT
CHLORAPREP W/TINT 26ML (MISCELLANEOUS) ×3 IMPLANT
DERMABOND ADVANCED (GAUZE/BANDAGES/DRESSINGS) ×2
DERMABOND ADVANCED .7 DNX12 (GAUZE/BANDAGES/DRESSINGS) IMPLANT
DRAPE C-ARM 42X72 X-RAY (DRAPES) ×3 IMPLANT
DRAPE C-ARMOR (DRAPES) ×3 IMPLANT
DRAPE INCISE IOBAN 66X45 STRL (DRAPES) ×1 IMPLANT
DRAPE ORTHO SPLIT 77X108 STRL (DRAPES) ×6
DRAPE PROXIMA HALF (DRAPES) ×3 IMPLANT
DRAPE SURG 17X23 STRL (DRAPES) ×6 IMPLANT
DRAPE SURG ORHT 6 SPLT 77X108 (DRAPES) IMPLANT
DRAPE U-SHAPE 47X51 STRL (DRAPES) ×3 IMPLANT
DRAPE UNIVERSAL PACK (DRAPES) ×1 IMPLANT
DRSG MEPILEX BORDER 4X4 (GAUZE/BANDAGES/DRESSINGS) ×2 IMPLANT
DRSG MEPILEX BORDER 4X8 (GAUZE/BANDAGES/DRESSINGS) IMPLANT
ELECT REM PT RETURN 9FT ADLT (ELECTROSURGICAL) ×3
ELECTRODE REM PT RTRN 9FT ADLT (ELECTROSURGICAL) ×1 IMPLANT
GLOVE BIO SURGEON STRL SZ7.5 (GLOVE) ×14 IMPLANT
GLOVE BIOGEL PI IND STRL 7.5 (GLOVE) ×1 IMPLANT
GLOVE BIOGEL PI INDICATOR 7.5 (GLOVE) ×2
GOWN STRL REUS W/ TWL LRG LVL3 (GOWN DISPOSABLE) ×2 IMPLANT
GOWN STRL REUS W/TWL LRG LVL3 (GOWN DISPOSABLE) ×6
GUIDEWIRE THREADED 2.8 (WIRE) ×6 IMPLANT
KIT BASIN OR (CUSTOM PROCEDURE TRAY) ×3 IMPLANT
KIT ROOM TURNOVER OR (KITS) ×3 IMPLANT
MANIFOLD NEPTUNE II (INSTRUMENTS) ×1 IMPLANT
NS IRRIG 1000ML POUR BTL (IV SOLUTION) ×3 IMPLANT
PACK TOTAL JOINT (CUSTOM PROCEDURE TRAY) ×3 IMPLANT
PAD ARMBOARD 7.5X6 YLW CONV (MISCELLANEOUS) ×6 IMPLANT
SCREW CANN 16 THRD/90 6.5 (Screw) ×2 IMPLANT
SCREW CORTEX 4.0 38MM (Screw) ×4 IMPLANT
SLEEVE SURGICAL STRL (SLEEVE) ×2 IMPLANT
SPONGE LAP 18X18 X RAY DECT (DISPOSABLE) IMPLANT
STAPLER VISISTAT 35W (STAPLE) ×1 IMPLANT
SUCTION FRAZIER HANDLE 10FR (MISCELLANEOUS)
SUCTION TUBE FRAZIER 10FR DISP (MISCELLANEOUS) ×1 IMPLANT
SUT MNCRL AB 3-0 PS2 18 (SUTURE) ×3 IMPLANT
SUT MON AB 2-0 CT1 36 (SUTURE) ×3 IMPLANT
TRAY FOLEY W/METER SILVER 16FR (SET/KITS/TRAYS/PACK) IMPLANT
WATER STERILE IRR 1000ML POUR (IV SOLUTION) ×1 IMPLANT
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
## 2017-04-19 NOTE — Progress Notes (Signed)
Orthopedic Tech Progress Note Patient Details:  [name] 1924-06-05 [id number]  Patient ID: [name], female   DOB: Jun 07, 1924, 83 y.o.   MRN: [id number] Pt cant have ohf due to age restrictions  Karolee Stamps 04/19/2017, 7:11 PM

## 2017-04-19 NOTE — H&P (Signed)
History and Physical    QUIN MCPHERSON VVO:[id number] DOB: 1923/08/18 DOA: 04/18/2017  PCP: Sandi Mariscal, MD  Patient coming from: Home.  Chief Complaint: FALL.  HPI: [name] is a 83 y.o. female with history of atrial fibrillation and CAD status post PCI, hypertension, asthma had a fall at home while walking. Patient states she suddenly fell denies any loss of consciousness chest pain or shortness of breath. Denies hitting her head.   ED Course: in the ER x-rays reveal left hip fracture. On exam patient remains hemodynamically stable. On call orthopedic surgeon has been consulted and patient admitted for further management of hip fracture.   Review of Systems: As per HPI, rest all negative.   Past Medical History:  Diagnosis Date  . Anxiety   . Arthritis   . CAD (coronary artery disease) 2007   a. 1997 s/p PCI of diagonal.  . Constipation   . COPD/Asthma   . Diverticulosis   . Esophageal reflux   . Family history of adverse reaction to anesthesia    daughter had trouble breathing after surgery, had to be reintubated  . Glaucoma   . HTN (hypertension), benign   . Hyperlipidemia   . Malignant melanoma ([place])    a. 09/2012 s/p resection.  . Neuropathy    lower legs  . PAF (paroxysmal atrial fibrillation) (HCC)    a. intolerant to beta blockers due to asthma and intolerant to CCB due to rash;  b. CHA2DS2VASc = 6-->chronic coumadin;  c. 03/2014 Echo: EF 60-65%, Gr 1 DD, triv AI, mildly dil RV, PASP 1mmhg.  [name] TIA (transient ischemic attack)    a. 02/2011 - chronic coumadin in setting of PAF.  [name] Urinary, incontinence, stress female     Past Surgical History:  Procedure Laterality Date  . ABDOMINAL HYSTERECTOMY    . APPENDECTOMY    . BACK SURGERY     lumbar laminectomy  . CHOLECYSTECTOMY    . COLONOSCOPY    . CORONARY ANGIOPLASTY WITH STENT PLACEMENT    . EYE SURGERY    . NASAL SINUS SURGERY    . VESICOVAGINAL FISTULA CLOSURE W/ TAH    . WEIL OSTEOTOMY  Left 06/12/2016   Procedure: Left [place] and Aiken Left [name], Weil Osteotomy 2nd and 3rd Metatarsal;  Surgeon: Newt Minion, MD;  Location: [facility];  Service: Orthopedics;  Laterality: Left;     reports that she quit smoking about 50 years ago. Her smoking use included Cigarettes. She has a 5.00 pack-year smoking history. She has never used smokeless tobacco. She reports that she does not drink alcohol or use drugs.  Allergies  Allergen Reactions  . Alphagan [Brimonidine] Other (See Comments)    Burning sensation  . Apraclonidine Other (See Comments)    Pain, brow pain, tender, not able to tolerate  . Biaxin [Clarithromycin]     Unknown, per pt   . Brovana [Arformoterol] Other (See Comments)    Makes my heart race  . Budesonide Other (See Comments)    Makes my heart race  . Cefdinir     "FEEL HORRIBLE"  . Cephalexin Itching  . Cephalexin Nausea Only  . Cifenline Itching  . Clonidine Other (See Comments)    unknown  . Clonidine Derivatives Other (See Comments)    REACTION: unknown  . Dorzolamide Other (See Comments)    Redness  . Iohexol Other (See Comments)     Desc: NOTES FROM PRIOR CT STATES PRE MEDICATION PRIOR TO OMNIPAQUE  ENHANCED CT   . Nsaids Other (See Comments)    unknown unknown  . Penicillins Itching, Swelling and Other (See Comments)    Swelling and edema of ears and rash Swelling to ears  Has patient had a PCN reaction causing immediate rash, facial/tongue/throat swelling, SOB or lightheadedness with hypotension: {no Has patient had a PCN reaction causing severe rash involving mucus membranes or skin necrosis: {no Has patient had a PCN reaction that required hospitalization no Has patient had a PCN reaction occurring within the last 10 years: no If all of the above answers are "NO", then may proceed with Cephalosporin use.  . Sulfamethoxazole Other (See Comments)    unknown unknown  . Sulfonamide Derivatives Itching  . Tetracycline Itching and Other  (See Comments)    unknown  . Timolol Other (See Comments)    Eye red and irritated Eye red and irritated  . Vancomycin Other (See Comments)    unknown Turn red  . Zetia [Ezetimibe] Other (See Comments)    unknown unknown  . Cardizem [Diltiazem Hcl] Rash    Family History  Problem Relation Age of Onset  . Heart disease Father   . Hemachromatosis Father   . Heart attack Father   . Colon cancer Mother   . Peripheral vascular disease Mother   . Peripheral vascular disease Sister   . Cancer Sister   . Lymphoma Sister   . Heart attack Brother   . Heart disease Brother     Prior to Admission medications   Medication Sig Start Date End Date Taking? Authorizing Provider  acetaminophen (TYLENOL) 325 MG tablet Take 325 mg by mouth every 6 (six) hours as needed for moderate pain.    Yes [provider]  albuterol (PROAIR HFA) 108 (90 Base) MCG/ACT inhaler Inhale 1-2 puffs into the lungs every 4 (four) hours as needed for wheezing or shortness of breath. 12/11/15  Yes Juanito Doom, MD  ALPRAZolam Duanne Moron) 0.25 MG tablet Take 1 tablet (0.25 mg total) by mouth at bedtime as needed for anxiety. 06/15/16  Yes Newt Minion, MD  amLODipine (NORVASC) 10 MG tablet Take 1 tablet (10 mg total) by mouth daily. 04/01/17 06/30/17 Yes Turner, Eber Hong, MD  cetirizine (ZYRTEC) 10 MG tablet Take 10 mg by mouth daily as needed for allergies.   Yes [provider]  Cholecalciferol (VITAMIN D) 2000 units tablet Take 2,000 Units by mouth daily.   Yes [provider]  fluticasone (FLONASE) 50 MCG/ACT nasal spray Place 1 spray into both nostrils daily as needed for allergies or rhinitis. 04/14/16  Yes McQuaid, Ronie Spies, MD  fluticasone furoate-vilanterol (BREO ELLIPTA) 200-25 MCG/INH AEPB Inhale 1 puff into the lungs daily. 01/27/17  Yes Juanito Doom, MD  furosemide (LASIX) 40 MG tablet Take 40 mg by mouth daily as needed for fluid.    Yes [provider]  irbesartan  (AVAPRO) 300 MG tablet Take 1 tablet (300 mg total) by mouth daily. 04/01/17  Yes Turner, Eber Hong, MD  latanoprost (XALATAN) 0.005 % ophthalmic solution Place 1 drop into both eyes at bedtime.   Yes [provider]  Multiple Vitamins-Minerals (MULTIVITAMIN & MINERAL PO) Take 1 tablet by mouth daily.   Yes [provider]  pilocarpine (PILOCAR) 1 % ophthalmic solution Place 1 drop into both eyes 2 times daily. 08/17/16 08/17/17 Yes [provider]  warfarin (COUMADIN) 2.5 MG tablet Take 2.5-3.75 mg by mouth daily. Take 3.75 mg (1.5 tablets) on Sun, Tues, Weds,  Thurs, Sat & Take 2.5 mg (1 tablet) on Mon & Fri 07/18/15  Yes [provider]    Physical Exam: Vitals:   04/18/17 2140 04/18/17 2145 04/18/17 2300 04/18/17 2345  BP:  (!) 151/69 (!) 144/67 137/63  Pulse:  (!) 110 (!) 111 (!) 111  Resp:   (!) 28 (!) 23  Temp:      TempSrc:      SpO2:  96% 96% 97%  Weight: 57.6 kg (127 lb)     Height: 5\' 6"  (1.676 m)         Constitutional: moderately built and nourished. Vitals:   04/18/17 2140 04/18/17 2145 04/18/17 2300 04/18/17 2345  BP:  (!) 151/69 (!) 144/67 137/63  Pulse:  (!) 110 (!) 111 (!) 111  Resp:   (!) 28 (!) 23  Temp:      TempSrc:      SpO2:  96% 96% 97%  Weight: 57.6 kg (127 lb)     Height: 5\' 6"  (1.676 m)      Eyes: anicteric no pallor. ENMT: no discharge from the ears eyes nose and mouth. Neck: no mass felt. No JVD appreciated. Respiratory: no rhonchi or crepitations. Cardiovascular: S1-S2 heard no murmurs appreciated. Abdomen: soft nontender bowel sounds present. Musculoskeletal: pain on moving left hip. Skin: no rash. Neurologic:alert awake oriented to time place and person. Moves all extremities. Psychiatric: appears normal. Normal affect.   Labs on Admission: I have personally reviewed following labs and imaging studies  CBC:  Recent Labs Lab 04/18/17 2234  WBC 11.8*  NEUTROABS 9.9*  HGB 12.0  HCT 35.2*  MCV 90.3    PLT 562*   Basic Metabolic Panel:  Recent Labs Lab 04/18/17 2234  NA 133*  K 3.7  CL 101  CO2 23  GLUCOSE 128*  BUN 21*  CREATININE 0.85  CALCIUM 8.6*   GFR: Estimated Creatinine Clearance: 39.2 mL/min (by C-G formula based on SCr of 0.85 mg/dL). Liver Function Tests:  Recent Labs Lab 04/18/17 2234  AST 25  ALT 18  ALKPHOS 45  BILITOT 0.8  PROT 6.6  ALBUMIN 3.7   No results for input(s): LIPASE, AMYLASE in the last 168 hours. No results for input(s): AMMONIA in the last 168 hours. Coagulation Profile:  Recent Labs Lab 04/18/17 2234  INR 1.61   Cardiac Enzymes: No results for input(s): CKTOTAL, CKMB, CKMBINDEX, TROPONINI in the last 168 hours. BNP (last 3 results) No results for input(s): PROBNP in the last 8760 hours. HbA1C: No results for input(s): HGBA1C in the last 72 hours. CBG: No results for input(s): GLUCAP in the last 168 hours. Lipid Profile: No results for input(s): CHOL, HDL, LDLCALC, TRIG, CHOLHDL, LDLDIRECT in the last 72 hours. Thyroid Function Tests: No results for input(s): TSH, T4TOTAL, FREET4, T3FREE, THYROIDAB in the last 72 hours. Anemia Panel: No results for input(s): VITAMINB12, FOLATE, FERRITIN, TIBC, IRON, RETICCTPCT in the last 72 hours. Urine analysis:    Component Value Date/Time   COLORURINE YELLOW 03/05/2015 1600   APPEARANCEUR CLEAR 03/05/2015 1600   LABSPEC 1.011 03/05/2015 1600   PHURINE 5.5 03/05/2015 1600   GLUCOSEU NEGATIVE 03/05/2015 1600   HGBUR NEGATIVE 03/05/2015 1600   BILIRUBINUR NEGATIVE 03/05/2015 1600   KETONESUR NEGATIVE 03/05/2015 1600   PROTEINUR NEGATIVE 03/05/2015 1600   UROBILINOGEN 0.2 03/05/2015 1600   NITRITE NEGATIVE 03/05/2015 1600   LEUKOCYTESUR NEGATIVE 03/05/2015 1600   Sepsis Labs: @LABRCNTIP (procalcitonin:4,lacticidven:4) )No results found for this or any previous visit (from the past 240 hour(s)).  Radiological Exams on Admission: Dg Chest Port 1 View  Result Date:  04/18/2017 CLINICAL DATA:  Left hip fracture EXAM: PORTABLE CHEST 1 VIEW COMPARISON:  01/27/2017 FINDINGS: A single AP portable view of the chest demonstrates no focal airspace consolidation or alveolar edema. The lungs are grossly clear. Shallow inspiration. There is no large effusion or pneumothorax. Cardiac and mediastinal contours appear unremarkable. IMPRESSION: No active disease. Electronically Signed   By: Andreas Newport M.D.   On: 04/18/2017 23:04   Dg Knee Complete 4 Views Left  Result Date: 04/18/2017 CLINICAL DATA:  Left knee gave away and patient fell at home tonight. Left hip and left knee pain. EXAM: LEFT KNEE - COMPLETE 4+ VIEW COMPARISON:  Left femur 02/28/2014 FINDINGS: Degenerative changes in the left knee with medial and lateral compartment narrowing as well as tricompartment osteophyte formation. No evidence of acute fracture or dislocation. No focal bone lesion or bone destruction. Old ununited ossicle posterior to the femoral heads may represent a loose body and is increased in size since previous study. No significant effusion. Prominent vascular calcifications. IMPRESSION: Mild to moderate tricompartment degenerative changes in the left knee. Old ununited ossicle posterior to the femoral heads is increased in size since previous study and may represent a chronic loose body. No significant effusion. Electronically Signed   By: Lucienne Capers M.D.   On: 04/18/2017 22:27   Dg Hip Unilat W Or W/o Pelvis 2-3 Views Left  Result Date: 04/18/2017 CLINICAL DATA:  Patient fell at home tonight.  Left hip pain. EXAM: DG HIP (WITH OR WITHOUT PELVIS) 2-3V LEFT COMPARISON:  None. FINDINGS: There is a transverse subcapital fracture of the left femoral neck with valgus angulation of the fracture fragments. Mild impaction and mild medial displacement of the distal fracture fragment. No dislocation at the hip joint. Prominent degenerative changes in the hip joint with hypertrophic changes off  the lateral acetabular rim. The pelvis appears intact. SI joints and symphysis pubis are not displaced. Degenerative changes in the lower lumbar spine and right hip. Vascular calcifications. IMPRESSION: Transverse subcapital fracture of the left femoral neck with valgus angulation. Electronically Signed   By: Lucienne Capers M.D.   On: 04/18/2017 22:28    EKG: Independently reviewed. Sinus tachycardia.  Assessment/Plan Principal Problem:   Closed left hip fracture, initial encounter [facility]) Active Problems:   CAD (coronary artery disease)   Persistent atrial fibrillation (HCC)   Essential hypertension   Adult hypothyroidism   Hip fracture ([place])    1. Left hip fracture - atient is hemodynamically stable and denies any chest pain or shortness of breath. Patient is at moderate risk for intermediate risk procedure. Patient will be kept nothing by mouth in anticipation of surgery. Orthopedic surgery has been consulted. Patient is on pain relief medications. Patient's INR is subtherapeutic and Coumadin is on hold for surgery. 2. CAD status post PCI - denies any chest pain. 3. Paroxysmal atrial fibrillation - holding Coumadin due to surgery. 4. Hypertension - we'll continue home medications.Hold Lasix for now. Restart after surgery. 5. History of asthma not actively wheezing. Continue inhalers.  I have reviewed patient's old charts and labs.   DVT prophylaxis: SCDs. Code Status: full code.  Family Communication: discussed with patient. Discussed with patient.  Disposition Plan: to be determined.  Consults called: orthopedics.  Admission status: inpatient.    Rise Patience MD [facility] Pager [phone].  If 7PM-7AM, please contact night-coverage [email or web address] Password [email or web address]  04/19/2017, 12:25 AM

## 2017-04-19 NOTE — Anesthesia Preprocedure Evaluation (Signed)
Anesthesia Evaluation    Airway Mallampati: II  TM Distance: >3 FB Neck ROM: Full    Dental  (+) Partial Lower, Partial Upper   Pulmonary former smoker,    breath sounds clear to auscultation       Cardiovascular hypertension,  Rhythm:Irregular Rate:Normal     Neuro/Psych    GI/Hepatic   Endo/Other    Renal/GU      Musculoskeletal   Abdominal   Peds  Hematology   Anesthesia Other Findings   Reproductive/Obstetrics                             Anesthesia Physical Anesthesia Plan  ASA: III  Anesthesia Plan: General   Post-op Pain Management:    Induction: Intravenous  PONV Risk Score and Plan: Ondansetron and Dexamethasone  Airway Management Planned: Oral ETT  Additional Equipment:   Intra-op Plan:   Post-operative Plan: Extubation in OR  Informed Consent: I have reviewed the patients History and Physical, chart, labs and discussed the procedure including the risks, benefits and alternatives for the proposed anesthesia with the patient or authorized representative who has indicated his/her understanding and acceptance.   Dental advisory given  Plan Discussed with: CRNA and Anesthesiologist  Anesthesia Plan Comments:         Anesthesia Quick Evaluation

## 2017-04-19 NOTE — Anesthesia Procedure Notes (Signed)
Procedure Name: Intubation Date/Time: 04/19/2017 3:51 PM Performed by: Oletta Lamas Pre-anesthesia Checklist: Patient identified, Emergency Drugs available, Suction available and Patient being monitored Patient Re-evaluated:Patient Re-evaluated prior to induction Oxygen Delivery Method: Circle System Utilized Preoxygenation: Pre-oxygenation with 100% oxygen Induction Type: IV induction Ventilation: Mask ventilation without difficulty Laryngoscope Size: Mac and 3 Grade View: Grade I Tube type: Oral Tube size: 7.0 mm Number of attempts: 1 Airway Equipment and Method: Stylet Placement Confirmation: ETT inserted through vocal cords under direct vision,  positive ETCO2 and breath sounds checked- equal and bilateral Secured at: 22 cm Tube secured with: Tape Dental Injury: Teeth and Oropharynx as per pre-operative assessment

## 2017-04-19 NOTE — Op Note (Signed)
OrthopaedicSurgeryOperativeNote (YBO:[id number]) Date of Surgery: 04/19/2017  Admit Date: 04/18/2017   Diagnoses: Pre-Op Diagnoses: Left valgus impacted femoral neck   Post-Op Diagnosis: Same  Procedures: CPT 27235-Percutaneous fixation of left femoral neck  Surgeons: Primary: Shona Needles, MD   Location:[facility] OR ROOM 07   AnesthesiaGeneral   Antibiotics:Ancef 2g preop   Tourniquettime:None.  EstimatedBloodLoss:Minimal  Complications:None  Specimens:None  Implants: Synthes 6.77mm partially threaded cannulate screws  IndicationsforSurgery: 83 y.o. female that was attempting to take her dog out for a walk when she tripped and fell and sustained a left femoral neck fracture. She had immediate pain and inability to bear weight. X-rays showed a valgus impacted femoral neck fracture and I was consulted. Her femoral neck appears impacted and likely would do well with percutaneous fixation of her femoral neck. Discussed with the patient that if the fracture displaces it will likely need a hemiarthroplasty. Risks and benefits discussed. Risks discussed included bleeding requiring blood transfusion, bleeding causing a hematoma, infection, malunion, nonunion, damage to surrounding nerves and blood vessels, pain, hardware prominence or irritation, hardware failure, stiffness, post-traumatic arthritis, DVT/PE, compartment syndrome, and even death. Risks and benefits were extensively discussed as noted above and the patient and their family agreed to proceed with surgery and consent was obtained.  Operative Findings: Placement of three partially threaded 6.70mm cannulated screws in inverted triangle  Procedure: The patient was identified in the preoperative holding area. Consent was confirmed with the patient and their family and all questions were answered. The operative extremity was marked after confirmation with the patient they were then brought back to the operating room  by our anesthesia colleagues. They were placed under general anesthesia and carefully transferred to a radiolucent flat top table. A bump was placed under the operative hip and fluoroscopy was used to confirm that the fracture had not displaced. The operative extremity was then prepped and draped in usual sterile fashion. A preoperative timeout was performed to verify the patient, the procedure, and the extremity. Preoperative antibiotics were dosed.  Using fluoroscopy as a guide I marked out an incision. I carried this down through skin and the IT band. I used 2.10mm guidepins to direct up the femoral neck in an inverted triangle. I placed anterior superior guidepin, a posterior superior guidepin and a posterior inferior guidepin. I confirmed positioning with fluoroscopy and then proceeded to measure and placed 6.63mm screws across the femoral neck. I placed the inferior one with 87mm partially threaded screw with a washer to compress. The superior screws were placed and obtained excellent fixation.  Final fluoroscopic images were obtained confirming length on all screws. An approach withdrawal technique was used to make sure all screws were extra-articular. The incision was irrigated and closed with 2-0 vicryl, 3-0 monocryl and dermabond. A dressing was placed. The patient was awoken from anesthesia and taken to the PACU in stable condition.   Post Op Plan/Instructions: Plan for weight bearing as tolerated LLE. Recommend ancef postoperatively and Lovenox for DVT prophylaxis.  I was present and performed the entire surgery.  Katha Hamming, MD [facility]

## 2017-04-19 NOTE — Anesthesia Postprocedure Evaluation (Signed)
Anesthesia Post Note  Patient: [name]  Procedure(s) Performed: Percutaneous fixation of femoral neck (Left Hip)     Patient location during evaluation: PACU Anesthesia Type: General Level of consciousness: sedated Pain management: pain level controlled Vital Signs Assessment: post-procedure vital signs reviewed and stable Respiratory status: spontaneous breathing and respiratory function stable Cardiovascular status: stable Postop Assessment: no apparent nausea or vomiting Anesthetic complications: no    Last Vitals:  Vitals:   04/19/17 1817 04/19/17 1845  BP:  139/70  Pulse: 95 95  Resp: (!) 25 20  Temp: 36.4 C 36.5 C  SpO2: 99% 97%    Last Pain:  Vitals:   04/19/17 1845  TempSrc: Oral  PainSc:                  Guneet Delpino DANIEL

## 2017-04-19 NOTE — Transfer of Care (Signed)
Immediate Anesthesia Transfer of Care Note  Patient: [name]  Procedure(s) Performed: Percutaneous fixation of femoral neck (Left Hip)  Patient Location: PACU  Anesthesia Type:General  Level of Consciousness: awake, alert  and patient cooperative  Airway & Oxygen Therapy: Patient Spontanous Breathing  Post-op Assessment: Report given to RN and Post -op Vital signs reviewed and stable  Post vital signs: Reviewed and stable  Last Vitals:  Vitals:   04/19/17 0500 04/19/17 1705  BP: (!) 140/58 130/62  Pulse: 80 83  Resp: 20 11  Temp: 36.9 C   SpO2: 96% 99%    Last Pain:  Vitals:   04/19/17 1300  TempSrc:   PainSc: 5          Complications: No apparent anesthesia complications   Arousable to voice. Resting eyes closed comfortably. Denies any sob, pain, or discomfort at present.

## 2017-04-19 NOTE — Progress Notes (Signed)
Initial Nutrition Assessment  DOCUMENTATION CODES:   Not applicable  INTERVENTION:  Once diet advances, provide vanilla Ensure Enlive po BID, each supplement provides 350 kcal and 20 grams of protein  NUTRITION DIAGNOSIS:   Increased nutrient needs related to  (post op healing) as evidenced by estimated needs.  GOAL:   Patient will meet greater than or equal to 90% of their needs  MONITOR:   Labs, Weight trends, Skin, Diet advancement, I & O's  REASON FOR ASSESSMENT:   Consult Hip fracture protocol  ASSESSMENT:   83 y.o. female with history of atrial fibrillation and CAD status post PCI, hypertension, asthma had a fall at home while walking. X-rays reveal left hip fracture.  Pt currently NPO for surgery today. Pt reports eating fair at home with usual consumption of at least 2-3 meals a day with an Ensure Shake on occasion. Weight has been stable. RD to order nutritional supplements once diet advances to aid in post op healing.   Limited Nutrition-Focused physical exam completed. Findings are severe muscle depletion, and mild edema. RD unable to observe fat mass at time of visit.  Labs and medications reviewed.   Diet Order:  Diet NPO time specified Except for: Sips with Meds  Skin:  Reviewed, no issues  Last BM:  10/14  Height:   Ht Readings from Last 1 Encounters:  04/18/17 5\' 6"  (1.676 m)    Weight:   Wt Readings from Last 1 Encounters:  04/18/17 127 lb (57.6 kg)    Ideal Body Weight:  59 kg  BMI:  Body mass index is 20.5 kg/m.  Estimated Nutritional Needs:   Kcal:  1450-1650  Protein:  60-70 grams  Fluid:  >/= 1.5L/day  EDUCATION NEEDS:   No education needs identified at this time  Corrin Parker, MS, RD, LDN Pager # [phone] After hours/ weekend pager # [phone]

## 2017-04-19 NOTE — Progress Notes (Signed)
Agree with the history and physical and plan as per my partner who admitted this patient 10 15 18  a.m.  83 year old female Atrial fibrillation, [place] score >6, on warfarin CAD with PCI 1997 HTN Asthma--classified as severe persistent, former smoker-FEV1 1.23 DLCO 67% Nasal polyps-declined to have surgery  Recently seen by both cardiology as well as pulmonology in the outpatient setting-given a prednisone burst in July 2018  Sustained a fall, into the emergency room and found to have hip fracture of left hip Orthopedics consulted to assess for feasibility of surgery  INR 1.6 on admission Rest of labs normal  O/e  BP (!) 140/58 (BP Location: Right Arm)   Pulse 80   Temp 98.4 F (36.9 C) (Oral)   Resp 20   Ht 5\' 6"  (1.676 m)   Wt 57.6 kg (127 lb)   SpO2 96%   BMI 20.50 kg/m  No cp No sob Seen at short stay   Looks much younger than stated age s1 s 2no m/r/g cta b abd soft nt nd no rebound no gaurding No le edema--L leg ext rotated--did not passively assess motion as probable pain Neuro intact-pulses felt in LE's  Appreciate Dr. Doreatha Martin thoughtful consideration for surgery--she is mod risk given severe asthma, Afib but agree does need surgery Will update family in am  Verneita Griffes, MD [facility] [phone]

## 2017-04-20 MED ORDER — WARFARIN SODIUM 5 MG PO TABS
5.0000 mg | ORAL_TABLET | Freq: Once | ORAL | Status: AC
  Administered 2017-04-20: 5 mg via ORAL
  Filled 2017-04-20: qty 1

## 2017-04-20 MED ORDER — ALPRAZOLAM 0.25 MG PO TABS
0.2500 mg | ORAL_TABLET | Freq: Two times a day (BID) | ORAL | Status: DC | PRN
  Administered 2017-04-20 – 2017-04-22 (×3): 0.25 mg via ORAL
  Filled 2017-04-20 (×3): qty 1

## 2017-04-20 MED ORDER — OXYCODONE-ACETAMINOPHEN 5-325 MG PO TABS: 1.0000 | ORAL_TABLET | Freq: Four times a day (QID) | ORAL | 0 refills | Status: DC | PRN

## 2017-04-20 MED ORDER — ALPRAZOLAM 0.25 MG PO TABS: 0.2500 mg | ORAL_TABLET | Freq: Every evening | ORAL | 0 refills | Status: DC | PRN

## 2017-04-20 MED ORDER — PILOCARPINE HCL 1 % OP SOLN
1.0000 [drp] | Freq: Three times a day (TID) | OPHTHALMIC | Status: DC
  Administered 2017-04-20 – 2017-04-23 (×8): 1 [drp] via OPHTHALMIC
  Filled 2017-04-20: qty 15

## 2017-04-20 MED ORDER — FLUTICASONE PROPIONATE 50 MCG/ACT NA SUSP
2.0000 | Freq: Every day | NASAL | Status: DC
  Administered 2017-04-22: 2 via NASAL
  Filled 2017-04-20 (×2): qty 16

## 2017-04-20 MED ORDER — WARFARIN - PHARMACIST DOSING INPATIENT
Freq: Every day | Status: DC
  Administered 2017-04-22: 18:00:00

## 2017-04-20 NOTE — Progress Notes (Signed)
PT was id with name birth date and mrn

## 2017-04-20 NOTE — Discharge Instructions (Addendum)
Information on my medicine - Coumadin®   (Warfarin) ° °This medication education was reviewed with me or my healthcare representative as part of my discharge preparation.  The pharmacist that spoke with me during my hospital stay was:  Robertson, Crystal Stillinger, RPH ° °Why was Coumadin prescribed for you? °Coumadin was prescribed for you because you have a blood clot or a medical condition that can cause an increased risk of forming blood clots. Blood clots can cause serious health problems by blocking the flow of blood to the heart, lung, or brain. Coumadin can prevent harmful blood clots from forming. °As a reminder your indication for Coumadin is:   Stroke Prevention Because Of Atrial Fibrillation ° °What test will check on my response to Coumadin? °While on Coumadin (warfarin) you will need to have an INR test regularly to ensure that your dose is keeping you in the desired range. The INR (international normalized ratio) number is calculated from the result of the laboratory test called prothrombin time (PT). ° °If an INR APPOINTMENT HAS NOT ALREADY BEEN MADE FOR YOU please schedule an appointment to have this lab work done by your health care provider within 7 days. °Your INR goal is usually a number between:  2 to 3 or your provider may give you a more narrow range like 2-2.5.  Ask your health care provider during an office visit what your goal INR is. ° °What  do you need to  know  About  COUMADIN? °Take Coumadin (warfarin) exactly as prescribed by your healthcare provider about the same time each day.  DO NOT stop taking without talking to the doctor who prescribed the medication.  Stopping without other blood clot prevention medication to take the place of Coumadin may increase your risk of developing a new clot or stroke.  Get refills before you run out. ° °What do you do if you miss a dose? °If you miss a dose, take it as soon as you remember on the same day then continue your regularly scheduled  regimen the next day.  Do not take two doses of Coumadin at the same time. ° °Important Safety Information °A possible side effect of Coumadin (Warfarin) is an increased risk of bleeding. You should call your healthcare provider right away if you experience any of the following: °  Bleeding from an injury or your nose that does not stop. °  Unusual colored urine (red or dark brown) or unusual colored stools (red or black). °  Unusual bruising for unknown reasons. °  A serious fall or if you hit your head (even if there is no bleeding). ° °Some foods or medicines interact with Coumadin® (warfarin) and might alter your response to warfarin. To help avoid this: °  Eat a balanced diet, maintaining a consistent amount of Vitamin K. °  Notify your provider about major diet changes you plan to make. °  Avoid alcohol or limit your intake to 1 drink for women and 2 drinks for men per day. °(1 drink is 5 oz. wine, 12 oz. beer, or 1.5 oz. liquor.) ° °Make sure that ANY health care provider who prescribes medication for you knows that you are taking Coumadin (warfarin).  Also make sure the healthcare provider who is monitoring your Coumadin knows when you have started a new medication including herbals and non-prescription products. ° °Coumadin® (Warfarin)  Major Drug Interactions  °Increased Warfarin Effect Decreased Warfarin Effect  °Alcohol (large quantities) °Antibiotics (esp. Septra/Bactrim, Flagyl, Cipro) °Amiodarone (Cordarone) °Aspirin (  ASA) °Cimetidine (Tagamet) °Megestrol (Megace) °NSAIDs (ibuprofen, naproxen, etc.) °Piroxicam (Feldene) °Propafenone (Rythmol SR) °Propranolol (Inderal) °Isoniazid (INH) °Posaconazole (Noxafil) Barbiturates (Phenobarbital) °Carbamazepine (Tegretol) °Chlordiazepoxide (Librium) °Cholestyramine (Questran) °Griseofulvin °Oral Contraceptives °Rifampin °Sucralfate (Carafate) °Vitamin K  ° °Coumadin® (Warfarin) Major Herbal Interactions  °Increased Warfarin Effect Decreased Warfarin Effect    °Garlic °Ginseng °Ginkgo biloba Coenzyme Q10 °Green tea °St. John’s wort   ° °Coumadin® (Warfarin) FOOD Interactions  °Eat a consistent number of servings per week of foods HIGH in Vitamin K °(1 serving = ½ cup)  °Collards (cooked, or boiled & drained) °Kale (cooked, or boiled & drained) °Mustard greens (cooked, or boiled & drained) °Parsley *serving size only = ¼ cup °Spinach (cooked, or boiled & drained) °Swiss chard (cooked, or boiled & drained) °Turnip greens (cooked, or boiled & drained)  °Eat a consistent number of servings per week of foods MEDIUM-HIGH in Vitamin K °(1 serving = 1 cup)  °Asparagus (cooked, or boiled & drained) °Broccoli (cooked, boiled & drained, or raw & chopped) °Brussel sprouts (cooked, or boiled & drained) *serving size only = ½ cup °Lettuce, raw (green leaf, endive, romaine) °Spinach, raw °Turnip greens, raw & chopped  ° °These websites have more information on Coumadin (warfarin):  www.coumadin.com; °www.ahrq.gov/consumer/coumadin.htm; ° ° ° °

## 2017-04-20 NOTE — Progress Notes (Signed)
Orthopaedic Trauma Progress Note  S: Doing well ,pain better after surgery  O:  Vitals:   04/20/17 0139 04/20/17 0524  BP: 128/70 (!) 131/52  Pulse: 82 63  Resp: 20 20  Temp: 97.9 F (36.6 C) 98 F (36.7 C)  SpO2: 99% 100%   LLE: Dressing in place, thigh not swollen, ROM of hip sore but not painful. Motor and sensory function  A/P: POD1 CRPP of left femoral neck  -WBAT LLE -PT/OT -Pain control -Okay to restart coumadin -Dispo- patient would like to go home with [facility] therapy  Shona Needles, MD [facility] [phone] (phone)

## 2017-04-20 NOTE — Progress Notes (Signed)
ANTICOAGULATION CONSULT NOTE - Initial Consult  Pharmacy Consult for Coumadin Indication: atrial fibrillation  Allergies  Allergen Reactions  . Alphagan [Brimonidine] Other (See Comments)    Burning sensation  . Apraclonidine Other (See Comments)    Pain, brow pain, tender, not able to tolerate  . Biaxin [Clarithromycin]     Unknown, per pt   . Brovana [Arformoterol] Other (See Comments)    Makes my heart race  . Budesonide Other (See Comments)    Makes my heart race  . Cefdinir     "FEEL HORRIBLE"  . Cephalexin Itching  . Cephalexin Nausea Only  . Cifenline Itching  . Clonidine Other (See Comments)    unknown  . Clonidine Derivatives Other (See Comments)    REACTION: unknown  . Dorzolamide Other (See Comments)    Redness  . Iohexol Other (See Comments)     Desc: NOTES FROM PRIOR CT STATES PRE MEDICATION PRIOR TO OMNIPAQUE ENHANCED CT   . Nsaids Other (See Comments)    unknown unknown  . Penicillins Itching, Swelling and Other (See Comments)    Swelling and edema of ears and rash Swelling to ears  Has patient had a PCN reaction causing immediate rash, facial/tongue/throat swelling, SOB or lightheadedness with hypotension: {no Has patient had a PCN reaction causing severe rash involving mucus membranes or skin necrosis: {no Has patient had a PCN reaction that required hospitalization no Has patient had a PCN reaction occurring within the last 10 years: no If all of the above answers are "NO", then may proceed with Cephalosporin use.  . Sulfamethoxazole Other (See Comments)    unknown unknown  . Sulfonamide Derivatives Itching  . Tetracycline Itching and Other (See Comments)    unknown  . Timolol Other (See Comments)    Eye red and irritated Eye red and irritated  . Vancomycin Other (See Comments)    unknown Turn red  . Zetia [Ezetimibe] Other (See Comments)    unknown unknown  . Cardizem [Diltiazem Hcl] Rash    Patient Measurements: Height: 5\' 6"  (167.6  cm) Weight: 127 lb (57.6 kg) IBW/kg (Calculated) : 59.3  Vital Signs: Temp: 98 F (36.7 C) (10/16 0524) Temp Source: Oral (10/16 0524) BP: 131/52 (10/16 0524) Pulse Rate: 63 (10/16 0524)  Labs:  Recent Labs  04/18/17 2234 04/19/17 0342 04/19/17 0727  HGB 12.0 10.8*  --   HCT 35.2* 32.5*  --   PLT 123* 154  --   LABPROT 19.0*  --  17.3*  INR 1.61  --  1.43  CREATININE 0.85 0.71  --     Estimated Creatinine Clearance: 41.7 mL/min (by C-G formula based on SCr of 0.71 mg/dL).   Medical History: Past Medical History:  Diagnosis Date  . Anxiety   . Arthritis   . CAD (coronary artery disease) 2007   a. 1997 s/p PCI of diagonal.  . Constipation   . COPD/Asthma   . Diverticulosis   . Esophageal reflux   . Family history of adverse reaction to anesthesia    daughter had trouble breathing after surgery, had to be reintubated  . Glaucoma   . HTN (hypertension), benign   . Hyperlipidemia   . Malignant melanoma ([place])    a. 09/2012 s/p resection.  . Neuropathy    lower legs  . PAF (paroxysmal atrial fibrillation) (HCC)    a. intolerant to beta blockers due to asthma and intolerant to CCB due to rash;  b. CHA2DS2VASc = 6-->chronic coumadin;  c. 03/2014 Echo:  EF 60-65%, Gr 1 DD, triv AI, mildly dil RV, PASP 66mmhg.  [name] TIA (transient ischemic attack)    a. 02/2011 - chronic coumadin in setting of PAF.  [name] Urinary, incontinence, stress female    Assessment: CC/HPI: fall at home with L hip fx.   PMH: anxiety, arthritis, constipation, atrial fibrillation and CAD status post PCI, hypertension, asthma/COPD, diverticulosis, GERD, glaucoma, HLD, h/o malignant melanoma, neuropathy, h/o TIA, urinary incontinence  Significant events: 10/15: Percutaneous fixation of left femoral neck  Anticoag: Coumadin PTA for afib. CHA2DS2VASc = 6. Coumadin on hold for hip surgery. INR 1.43. Hgb 10.8 post-op - Dose PTA: 3.75mg  TWThSS and 2.5mg  MF. INR on admit 1.61  Goal of Therapy:  INR  2-3 Monitor platelets by anticoagulation protocol: Yes   Plan:  Coumadin 5mg  po x 1 tonight Daily INR Add LMWH for VTE prophx until INR therapeutic?  Talley Kreiser S. Alford Highland, PharmD, BCPS Clinical Staff Pharmacist Pager [phone]  [name] 04/20/2017,9:36 AM

## 2017-04-20 NOTE — Progress Notes (Signed)
PROGRESS NOTE    [name]  HFW:[id number] DOB: December 12, 1923 DOA: 04/18/2017 PCP: Sandi Mariscal, MD   Specialists:     Brief Narrative:   83 year old female Atrial fibrillation, [place] score >6, on warfarin CAD with PCI 1997 HTN Asthma--classified as severe persistent, former smoker-FEV1 1.23 DLCO 67% Nasal polyps-declined to have surgery   Recently seen by both cardiology as well as pulmonology in the outpatient setting-given a prednisone burst in July 2018   Sustained a fall, into the emergency room and found to have hip fracture of left hip Orthopedics consulted to assess for feasibility of surgery      Assessment & Plan:   Principal Problem:   Closed left hip fracture, initial encounter [facility]) Active Problems:   CAD (coronary artery disease)   Persistent atrial fibrillation ([place])   Essential hypertension   Adult hypothyroidism   Hip fracture ([place])   Hip fracture s/p repair 10/15 1.  per discretion Ortho services-appreciate their input into   Anticoagulation post=coumadin  WBAT LLE  Wound care  Pain management--not appearing to require much Iv mophine---? Tylenol and Ibu post d/c  Needs therapy evals prior to d/c  Afib Chad>6  inr low  Allow to trend up as per pharnmacy and resuming coumadin 10/16  Not requiring rate control  htn  Cont norvasc 10, Avapro 300  Bipolar  Cont Xanax 0.25 hs prn     DVT prophylaxis: coumadin Code Status: full Family Communication: none Disposition Plan: d/c when pt can see   Consultants:   ortho  Procedures:    hip repair 10/15  Antimicrobials:    none    Subjective:  Awake alert and pleasant in nad Not much pain today Eating and drinking No stool yet  Objective: Vitals:   04/20/17 0139 04/20/17 0524 04/20/17 1134 04/20/17 1347  BP: 128/70 (!) 131/52  (!) 134/55  Pulse: 82 63  75  Resp: 20 20  15   Temp: 97.9 F (36.6 C) 98 F (36.7 C)  98.7 F (37.1 C)  TempSrc: Oral Oral  Oral    SpO2: 99% 100% 98% 95%  Weight:      Height:        Intake/Output Summary (Last 24 hours) at 04/20/17 1608 Last data filed at 04/20/17 1348  Gross per 24 hour  Intake          1780.33 ml  Output             1325 ml  Net           455.33 ml   Filed Weights   04/18/17 2140  Weight: 57.6 kg (127 lb)    Examination:  younger than stated age s1 s 2no m/r/g cta b abd soft nt nd no rebound no gaurding No le edema--L leg  Propped up on chair Neuro intact-pulses felt in LE's   Data Reviewed: I have personally reviewed following labs and imaging studies  CBC:  Recent Labs Lab 04/18/17 2234 04/19/17 0342  WBC 11.8* 11.4*  NEUTROABS 9.9*  --   HGB 12.0 10.8*  HCT 35.2* 32.5*  MCV 90.3 91.8  PLT 123* 027   Basic Metabolic Panel:  Recent Labs Lab 04/18/17 2234 04/19/17 0342  NA 133* 137  K 3.7 4.0  CL 101 104  CO2 23 27  GLUCOSE 128* 109*  BUN 21* 18  CREATININE 0.85 0.71  CALCIUM 8.6* 8.5*   GFR: Estimated Creatinine Clearance: 41.7 mL/min (by C-G formula based on SCr of 0.71 mg/dL).  Liver Function Tests:  Recent Labs Lab 04/18/17 2234  AST 25  ALT 18  ALKPHOS 45  BILITOT 0.8  PROT 6.6  ALBUMIN 3.7   No results for input(s): LIPASE, AMYLASE in the last 168 hours. No results for input(s): AMMONIA in the last 168 hours. Coagulation Profile:  Recent Labs Lab 04/18/17 2234 04/19/17 0727  INR 1.61 1.43   Cardiac Enzymes: No results for input(s): CKTOTAL, CKMB, CKMBINDEX, TROPONINI in the last 168 hours. BNP (last 3 results) No results for input(s): PROBNP in the last 8760 hours. HbA1C: No results for input(s): HGBA1C in the last 72 hours. CBG: No results for input(s): GLUCAP in the last 168 hours. Lipid Profile: No results for input(s): CHOL, HDL, LDLCALC, TRIG, CHOLHDL, LDLDIRECT in the last 72 hours. Thyroid Function Tests: No results for input(s): TSH, T4TOTAL, FREET4, T3FREE, THYROIDAB in the last 72 hours. Anemia Panel: No results for  input(s): VITAMINB12, FOLATE, FERRITIN, TIBC, IRON, RETICCTPCT in the last 72 hours. Urine analysis:    Component Value Date/Time   COLORURINE YELLOW 03/05/2015 1600   APPEARANCEUR CLEAR 03/05/2015 1600   LABSPEC 1.011 03/05/2015 1600   PHURINE 5.5 03/05/2015 1600   GLUCOSEU NEGATIVE 03/05/2015 1600   HGBUR NEGATIVE 03/05/2015 1600   BILIRUBINUR NEGATIVE 03/05/2015 1600   KETONESUR NEGATIVE 03/05/2015 1600   PROTEINUR NEGATIVE 03/05/2015 1600   UROBILINOGEN 0.2 03/05/2015 1600   NITRITE NEGATIVE 03/05/2015 1600   LEUKOCYTESUR NEGATIVE 03/05/2015 1600     Radiology Studies: Reviewed images personally in health database    Scheduled Meds: . amLODipine  10 mg Oral Daily  . cholecalciferol  2,000 Units Oral Daily  . feeding supplement (ENSURE ENLIVE)  237 mL Oral BID BM  . fluticasone  2 spray Each Nare Daily  . fluticasone furoate-vilanterol  1 puff Inhalation Daily  . Influenza vac split quadrivalent PF  0.5 mL Intramuscular Tomorrow-1000  . irbesartan  300 mg Oral Daily  . latanoprost  1 drop Both Eyes QHS  . pilocarpine  1 drop Both Eyes Q8H  . warfarin  5 mg Oral ONCE-1800  . Warfarin - Pharmacist Dosing Inpatient   Does not apply q1800   Continuous Infusions: . lactated ringers 10 mL/hr at 04/19/17 1458  . lactated ringers 50 mL/hr at 04/20/17 0518     LOS: 2 days    Time spent: [place], MD [facility]   If 7PM-7AM, please contact night-coverage [email or web address] Password [email or web address] 04/20/2017, 4:08 PM

## 2017-04-21 DIAGNOSIS — Z7901 Long term (current) use of anticoagulants: Secondary | ICD-10-CM

## 2017-04-21 DIAGNOSIS — I10 Essential (primary) hypertension: Secondary | ICD-10-CM

## 2017-04-21 DIAGNOSIS — S72009A Fracture of unspecified part of neck of unspecified femur, initial encounter for closed fracture: Secondary | ICD-10-CM

## 2017-04-21 DIAGNOSIS — I481 Persistent atrial fibrillation: Secondary | ICD-10-CM

## 2017-04-21 LAB — CBC WITH DIFFERENTIAL/PLATELET
BASOS ABS: 0 10*3/uL (ref 0.0–0.1)
BASOS PCT: 0 %
EOS ABS: 0.1 10*3/uL (ref 0.0–0.7)
EOS PCT: 0 %
HCT: 30.5 % — ABNORMAL LOW (ref 36.0–46.0)
Hemoglobin: 10.3 g/dL — ABNORMAL LOW (ref 12.0–15.0)
LYMPHS PCT: 15 %
Lymphs Abs: 2.1 10*3/uL (ref 0.7–4.0)
MCH: 30.6 pg (ref 26.0–34.0)
MCHC: 33.8 g/dL (ref 30.0–36.0)
MCV: 90.5 fL (ref 78.0–100.0)
MONO ABS: 0.9 10*3/uL (ref 0.1–1.0)
Monocytes Relative: 6 %
Neutro Abs: 11 10*3/uL — ABNORMAL HIGH (ref 1.7–7.7)
Neutrophils Relative %: 79 %
PLATELETS: 130 10*3/uL — AB (ref 150–400)
RBC: 3.37 MIL/uL — AB (ref 3.87–5.11)
RDW: 14.8 % (ref 11.5–15.5)
WBC: 14.1 10*3/uL — AB (ref 4.0–10.5)

## 2017-04-21 LAB — COMPREHENSIVE METABOLIC PANEL
ALBUMIN: 3 g/dL — AB (ref 3.5–5.0)
ALT: 14 U/L (ref 14–54)
AST: 25 U/L (ref 15–41)
Alkaline Phosphatase: 40 U/L (ref 38–126)
Anion gap: 7 (ref 5–15)
BUN: 23 mg/dL — AB (ref 6–20)
CHLORIDE: 101 mmol/L (ref 101–111)
CO2: 29 mmol/L (ref 22–32)
CREATININE: 0.8 mg/dL (ref 0.44–1.00)
Calcium: 8.6 mg/dL — ABNORMAL LOW (ref 8.9–10.3)
GFR calc Af Amer: >60 mL/min (ref >60)
GFR calc non Af Amer: >60 mL/min (ref >60)
Glucose, Bld: 101 mg/dL — ABNORMAL HIGH (ref 65–99)
POTASSIUM: 4 mmol/L (ref 3.5–5.1)
SODIUM: 137 mmol/L (ref 135–145)
Total Bilirubin: 0.5 mg/dL (ref 0.3–1.2)
Total Protein: 5.6 g/dL — ABNORMAL LOW (ref 6.5–8.1)

## 2017-04-21 LAB — PROTIME-INR
INR: 1.32
PROTHROMBIN TIME: 16.2 s — AB (ref 11.4–15.2)

## 2017-04-21 MED ORDER — SENNOSIDES-DOCUSATE SODIUM 8.6-50 MG PO TABS
2.0000 | ORAL_TABLET | Freq: Every evening | ORAL | Status: DC | PRN
  Administered 2017-04-22: 2 via ORAL
  Filled 2017-04-21: qty 2

## 2017-04-21 MED ORDER — WARFARIN SODIUM 7.5 MG PO TABS
3.7500 mg | ORAL_TABLET | Freq: Once | ORAL | Status: AC
  Administered 2017-04-21: 3.75 mg via ORAL
  Filled 2017-04-21: qty 0.5

## 2017-04-21 NOTE — Progress Notes (Signed)
Pt refused low bed, states "it's too uncomfortable, the mattress is too hard." Pt transferred to regular unit bed as requested.  [email or web address]

## 2017-04-21 NOTE — Progress Notes (Signed)
ANTICOAGULATION CONSULT NOTE - Initial Consult  Pharmacy Consult for Coumadin Indication: atrial fibrillation  Allergies  Allergen Reactions  . Alphagan [Brimonidine] Other (See Comments)    Burning sensation  . Apraclonidine Other (See Comments)    Pain, brow pain, tender, not able to tolerate  . Biaxin [Clarithromycin]     Unknown, per pt   . Brovana [Arformoterol] Other (See Comments)    Makes my heart race  . Budesonide Other (See Comments)    Makes my heart race  . Cefdinir     "FEEL HORRIBLE"  . Cephalexin Itching  . Cephalexin Nausea Only  . Cifenline Itching  . Clonidine Other (See Comments)    unknown  . Clonidine Derivatives Other (See Comments)    REACTION: unknown  . Dorzolamide Other (See Comments)    Redness  . Iohexol Other (See Comments)     Desc: NOTES FROM PRIOR CT STATES PRE MEDICATION PRIOR TO OMNIPAQUE ENHANCED CT   . Nsaids Other (See Comments)    unknown unknown  . Penicillins Itching, Swelling and Other (See Comments)    Swelling and edema of ears and rash Swelling to ears  Has patient had a PCN reaction causing immediate rash, facial/tongue/throat swelling, SOB or lightheadedness with hypotension: {no Has patient had a PCN reaction causing severe rash involving mucus membranes or skin necrosis: {no Has patient had a PCN reaction that required hospitalization no Has patient had a PCN reaction occurring within the last 10 years: no If all of the above answers are "NO", then may proceed with Cephalosporin use.  . Sulfamethoxazole Other (See Comments)    unknown unknown  . Sulfonamide Derivatives Itching  . Tetracycline Itching and Other (See Comments)    unknown  . Timolol Other (See Comments)    Eye red and irritated Eye red and irritated  . Vancomycin Other (See Comments)    unknown Turn red  . Zetia [Ezetimibe] Other (See Comments)    unknown unknown  . Cardizem [Diltiazem Hcl] Rash    Patient Measurements: Height: 5\' 6"  (167.6  cm) Weight: 127 lb (57.6 kg) IBW/kg (Calculated) : 59.3  Assessment: 83 yo F on Coumadin 3.75mg  daily exc for 2.5mg  on MF PTA for afib. CHA2DS2VASc = 6. Coumadin was held for hip surgery. Now restarted on 10/16. INR 1.32. Hgb 10.3 post-op.  Goal of Therapy:  INR 2-3 Monitor platelets by anticoagulation protocol: Yes   Plan:  Give Coumadin 3.75mg  PO x 1 tonight  Monitor daily INR, CBC, s/s of bleed  Elenor Quinones, PharmD, [facility] Clinical Pharmacist Pager [phone] 04/21/2017 9:32 AM

## 2017-04-21 NOTE — Progress Notes (Addendum)
Orthopaedic Trauma Progress Note  S: No orthopaedic issues, pain in hip okay  O:  Vitals:   04/21/17 1350 04/21/17 2045  BP: (!) 118/49 (!) 129/52  Pulse: 80 70  Resp:  16  Temp: 97.9 F (36.6 C) 98 F (36.7 C)  SpO2: 98% 98%   LLE: Dressing in place, thigh not swollen, ROM of hip sore but not painful. Motor and sensory function  A/P: POD2 CRPP of left femoral neck  -WBAT LLE -PT/OT -Pain control -Dispo- patient would like to go home with [facility] therapy -Follow up with me in clinic in 3-4 weeks  Shona Needles, MD [facility] [phone] (phone)

## 2017-04-21 NOTE — Care Management Note (Signed)
Case Management Note  Patient Details  Name: BRIE EPPARD MRN: [id number] Date of Birth: January 03, 1924  Subjective/Objective:  83 yr old female s/p percutaneous fixation of left femoral neck.                Action/Plan:  Case manager spoke with patient concerning discharge plan and DME. Patient is adamant that she is not going to a rehab facility. Choice was offered for Home Health agency, patient says she has used [facility] in the past and wishes to do so now. Referral was called to Loraine Maple, [facility] Liaison. Patient says she has a rolling walker at home and will have family assistance at discharge.    Expected Discharge Date:    04/22/17              Expected Discharge Plan:  [place]  In-House Referral:  NA  Discharge planning Services  CM Consult  Post Acute Care Choice:  Home Health Choice offered to:  Patient  DME Arranged:  N/A (Has RW ) DME Agency:  NA  HH Arranged:  PT HH Agency:  [facility]  Status of Service:  Completed, signed off  If discussed at [place] of Stay Meetings, dates discussed:    Additional Comments:  Ninfa Meeker, RN 04/21/2017, 10:21 AM

## 2017-04-21 NOTE — Evaluation (Signed)
Physical Therapy Evaluation Patient Details Name: [name] MRN: [id number] DOB: 01/01/1924 Today's Date: 04/21/2017   History of Present Illness  Pt is a 83 y.o. female admitted to ED on 04/18/17 post-fall while walking at home (denies hitting head or LOC); x-ray showed L hip fx. Now s/p percutaneous fixation of left femoral neck on 04/19/17. Pertinent PMH includes HTN, HLD, neuropathy, TIA, CAD, glaucoma, COPD, anxiety, arthritis.     Clinical Impression  Pt presents with pain and an overall decrease in functional mobility secondary to above. PTA, pt lives alone and is mod indep for household amb and ADLs with rollator; family lives nearby and provides assists for errands as pt does not drive. Daughter plans to live with pt at d/c to provide 24/7 assist if needed. Educ on precautions, positioning, seated therex, and importance of mobility. Today, pt able to transfer and amb with RW and min guard for balance; further mobility limited by fatigue. Pt motivated to participate with therapies. Pt would benefit from continued acute PT services to maximize functional mobility and independence prior to d/c home with HHPT and supervision from daughter.      Follow Up Recommendations Home health PT;Supervision for mobility/OOB    Equipment Recommendations  None recommended by PT    Recommendations for Other Services       Precautions / Restrictions Precautions Precautions: Fall Restrictions Weight Bearing Restrictions: Yes LLE Weight Bearing: Weight bearing as tolerated      Mobility  Bed Mobility Overal bed mobility: Needs Assistance Bed Mobility: Supine to Sit     Supine to sit: Min assist     General bed mobility comments: MinA for UE support to assist trunk into sitting; pt indep to navigate BLEs off bed  Transfers Overall transfer level: Needs assistance Equipment used: Rolling walker (2 wheeled) Transfers: Sit to/from Stand Sit to Stand: Min guard          General transfer comment: Cues for hand placement on RW; no physical assist required. Increased time and effort secondary to pain  Ambulation/Gait Ambulation/Gait assistance: Min guard Ambulation Distance (Feet): 50 Feet Assistive device: Rolling walker (2 wheeled) Gait Pattern/deviations: Step-through pattern;Decreased stride length;Decreased weight shift to left;Antalgic;Trunk flexed Gait velocity: Decreased Gait velocity interpretation: <1.8 ft/sec, indicative of risk for recurrent falls General Gait Details: Amb laps in room with RW and min guard for balance; pt with good technique using RW (uses rollator at baseline). Further mobility limited secondary to pain and fatigue.  Stairs            Wheelchair Mobility    Modified Rankin (Stroke Patients Only)       Balance Overall balance assessment: Needs assistance Sitting-balance support: Bilateral upper extremity supported;Feet supported Sitting balance-Leahy Scale: Fair     Standing balance support: Bilateral upper extremity supported;During functional activity Standing balance-Leahy Scale: Poor Standing balance comment: Reliant on UE support                             Pertinent Vitals/Pain Pain Assessment: Faces Faces Pain Scale: Hurts little more Pain Location: L hip Pain Descriptors / Indicators: Aching;Discomfort Pain Intervention(s): Monitored during session;Repositioned    Home Living Family/patient expects to be discharged to:: Private residence Living Arrangements: Alone Available Help at Discharge: Family;Available 24 hours/day (Daughter) Type of Home: House Home Access: Stairs to enter Entrance Stairs-Rails: Right Entrance Stairs-Number of Steps: 3 Home Layout: One level Home Equipment: Walker - 4 wheels;Shower seat;Grab bars -  toilet;Grab bars - tub/shower Additional Comments: PTA, pt lives at home alone. Daughter will be staying with pt to provide assist at d/c    Prior Function  Level of Independence: Independent with assistive device(s)         Comments: Rollator for household mobility. Family members run errands for pt and drives her to appointments. Pt states she does not leave home unless someone is with her     Hand Dominance   Dominant Hand: Right    Extremity/Trunk Assessment   Upper Extremity Assessment Upper Extremity Assessment: Generalized weakness    Lower Extremity Assessment Lower Extremity Assessment: LLE deficits/detail LLE Deficits / Details: L hip flexion 3/5, L knee flex/ext 4/5 LLE Sensation: history of peripheral neuropathy;decreased light touch    Cervical / Trunk Assessment Cervical / Trunk Assessment: Kyphotic  Communication   Communication: No difficulties  Cognition Arousal/Alertness: Awake/alert Behavior During Therapy: WFL for tasks assessed/performed Overall Cognitive Status: Within Functional Limits for tasks assessed                                        General Comments      Exercises General Exercises - Lower Extremity Ankle Circles/Pumps: AROM;Both;15 reps;Seated Long Arc Quad: AROM;Both;20 reps;Seated Heel Slides: AAROM;Left;5 reps;Seated Hip Flexion/Marching: AROM;Both;20 reps;Seated   Assessment/Plan    PT Assessment Patient needs continued PT services  PT Problem List Decreased strength;Decreased range of motion;Decreased activity tolerance;Decreased balance;Decreased mobility;Pain       PT Treatment Interventions DME instruction;Gait training;Stair training;Functional mobility training;Therapeutic activities;Therapeutic exercise;Balance training;Patient/family education    PT Goals (Current goals can be found in the Care Plan section)  Acute Rehab PT Goals Patient Stated Goal: Return home with HHPT PT Goal Formulation: With patient Time For Goal Achievement: 05/05/17 Potential to Achieve Goals: Good    Frequency Min 5X/week   Barriers to discharge        Co-evaluation                AM-PAC PT "6 Clicks" Daily Activity  Outcome Measure Difficulty turning over in bed (including adjusting bedclothes, sheets and blankets)?: A Little Difficulty moving from lying on back to sitting on the side of the bed? : Unable Difficulty sitting down on and standing up from a chair with arms (e.g., wheelchair, bedside commode, etc,.)?: A Little Help needed moving to and from a bed to chair (including a wheelchair)?: A Little Help needed walking in hospital room?: A Little Help needed climbing 3-5 steps with a railing? : A Little 6 Click Score: 16    End of Session Equipment Utilized During Treatment: Gait belt Activity Tolerance: Patient tolerated treatment well;Patient limited by fatigue Patient left: in chair;with call bell/phone within reach Nurse Communication: Mobility status PT Visit Diagnosis: Other abnormalities of gait and mobility (R26.89);Pain Pain - Right/Left: Left Pain - part of body: Hip    Time: 4765-4650 PT Time Calculation (min) (ACUTE ONLY): 27 min   Charges:   PT Evaluation $PT Eval Moderate Complexity: 1 Mod PT Treatments $Gait Training: 8-22 mins   PT G Codes:       Mabeline Caras, PT, DPT Acute Rehab Services  Pager: [place] 04/21/2017, 2:51 PM

## 2017-04-21 NOTE — Progress Notes (Signed)
PROGRESS NOTE  COURTNEY BELLIZZI FAO:[id number] DOB: Feb 15, 1924 DOA: 04/18/2017 PCP: Sandi Mariscal, MD  HPI/Recap of past 71 hours: 83 year old female with a history of A. Fib, CAD status post PCI in 1997, hypertension, asthma, currently being managed for a closed left hip fracture.  Patient is status post repair of left hip fracture on 10/15.  Today patient denies any new complaints, denies any significant pain around her left hip.  Patient denies any chest pain, cough, shortness of breath, abdominal pain, nausea, vomiting, dizziness.  Patient eating and drinking okay, reports no bowel movement since admission.  Of note, patient's WBC went up to 14.1, has been afebrile throughout the admission.  Denies any dysuria, cough.  Assessment/Plan: Principal Problem:   Closed left hip fracture, initial encounter ([facility]) Active Problems:   CAD (coronary artery disease)   Persistent atrial fibrillation (HCC)   Essential hypertension   Adult hypothyroidism   Hip fracture (HCC)   # Closed left hip fracture status post repair 1015 Stable Surgical sites clean and dry Pain management, IV morphine as needed.  Tylenol and ibuprofen upon discharge Anticoagulation, on Coumadin, dosing per pharmacy Wound care on board PT consult placed Weightbearing as tolerated, left lower extremity Orthopedics on board Social worker on board, to discuss disposition options (patient prefers home health due to financial reasons)  # Leukocytosis likely reactive post op WBC 14.1, previous 11.4 Afebrile, denies any cough, urinary symptoms Repeat CBC in a.m. Monitor closely, if persistent, will order chest x-ray, UA  # Persistent  A. fib Heart rate controlled CHAD>6 INR today is 1.32 Daily INR Continue Coumadin as per pharmacy dosing  # Hypertension Controlled Continue Norvasc 10, Avapro 300 daily  # Asthma severe persistent Stable Continue inhalers: Breo, Proventil     Code Status: Full  Family  Communication: None  Disposition Plan: [facility] versus home health (social worker to discuss with patient).   Consultants:  Orthopedics, Dr. Katha Hamming  Procedures:  Left hip repair 10/15  Antimicrobials:  None  DVT prophylaxis: Coumadin   Objective: Vitals:   04/20/17 1134 04/20/17 1347 04/20/17 1957 04/21/17 0557  BP:  (!) 134/55 (!) 135/45 (!) 129/48  Pulse:  75 92 64  Resp:  15 15 15   Temp:  98.7 F (37.1 C) 97.9 F (36.6 C) 98 F (36.7 C)  TempSrc:  Oral Oral Oral  SpO2: 98% 95% 97% 96%  Weight:      Height:        Intake/Output Summary (Last 24 hours) at 04/21/17 0947 Last data filed at 04/21/17 0600  Gross per 24 hour  Intake              540 ml  Output             1000 ml  Net             -460 ml   Filed Weights   04/18/17 2140  Weight: 57.6 kg (127 lb)    Exam:   General: Alert, awake, oriented x3  Cardiovascular: S1-S2 present, no murmurs, rubs, gallops  Respiratory: Mild wheezing noted bilaterally, no other added sounds  Abdomen: Soft nontender nondistended  Musculoskeletal: No bilateral pedal edema seen, left hip wound site clean and dry  Neuro: Intact pulses felt in both lower extremity   Data Reviewed: CBC:  Recent Labs Lab 04/18/17 2234 04/19/17 0342 04/21/17 0504  WBC 11.8* 11.4* 14.1*  NEUTROABS 9.9*  --  11.0*  HGB 12.0 10.8* 10.3*  HCT 35.2* 32.5*  30.5*  MCV 90.3 91.8 90.5  PLT 123* 154 580*   Basic Metabolic Panel:  Recent Labs Lab 04/18/17 2234 04/19/17 0342 04/21/17 0504  NA 133* 137 137  K 3.7 4.0 4.0  CL 101 104 101  CO2 23 27 29   GLUCOSE 128* 109* 101*  BUN 21* 18 23*  CREATININE 0.85 0.71 0.80  CALCIUM 8.6* 8.5* 8.6*   GFR: Estimated Creatinine Clearance: 41.7 mL/min (by C-G formula based on SCr of 0.8 mg/dL). Liver Function Tests:  Recent Labs Lab 04/18/17 2234 04/21/17 0504  AST 25 25  ALT 18 14  ALKPHOS 45 40  BILITOT 0.8 0.5  PROT 6.6 5.6*  ALBUMIN 3.7 3.0*   No results for  input(s): LIPASE, AMYLASE in the last 168 hours. No results for input(s): AMMONIA in the last 168 hours. Coagulation Profile:  Recent Labs Lab 04/18/17 2234 04/19/17 0727 04/21/17 0504  INR 1.61 1.43 1.32   Cardiac Enzymes: No results for input(s): CKTOTAL, CKMB, CKMBINDEX, TROPONINI in the last 168 hours. BNP (last 3 results) No results for input(s): PROBNP in the last 8760 hours. HbA1C: No results for input(s): HGBA1C in the last 72 hours. CBG: No results for input(s): GLUCAP in the last 168 hours. Lipid Profile: No results for input(s): CHOL, HDL, LDLCALC, TRIG, CHOLHDL, LDLDIRECT in the last 72 hours. Thyroid Function Tests: No results for input(s): TSH, T4TOTAL, FREET4, T3FREE, THYROIDAB in the last 72 hours. Anemia Panel: No results for input(s): VITAMINB12, FOLATE, FERRITIN, TIBC, IRON, RETICCTPCT in the last 72 hours. Urine analysis:    Component Value Date/Time   COLORURINE YELLOW 03/05/2015 1600   APPEARANCEUR CLEAR 03/05/2015 1600   LABSPEC 1.011 03/05/2015 1600   PHURINE 5.5 03/05/2015 1600   GLUCOSEU NEGATIVE 03/05/2015 1600   HGBUR NEGATIVE 03/05/2015 1600   BILIRUBINUR NEGATIVE 03/05/2015 1600   KETONESUR NEGATIVE 03/05/2015 1600   PROTEINUR NEGATIVE 03/05/2015 1600   UROBILINOGEN 0.2 03/05/2015 1600   NITRITE NEGATIVE 03/05/2015 1600   LEUKOCYTESUR NEGATIVE 03/05/2015 1600   Sepsis Labs: @LABRCNTIP (procalcitonin:4,lacticidven:4)  ) Recent Results (from the past 240 hour(s))  Surgical pcr screen     Status: None   Collection Time: 04/19/17  2:32 AM  Result Value Ref Range Status   MRSA, PCR NEGATIVE NEGATIVE Final   Staphylococcus aureus NEGATIVE NEGATIVE Final    Comment: (NOTE) The Xpert SA Assay (FDA approved for NASAL specimens in patients 76 years of age and older), is one component of a comprehensive surveillance program. It is not intended to diagnose infection nor to guide or monitor treatment.       Studies: No results  found.  Scheduled Meds: . amLODipine  10 mg Oral Daily  . cholecalciferol  2,000 Units Oral Daily  . feeding supplement (ENSURE ENLIVE)  237 mL Oral BID BM  . fluticasone  2 spray Each Nare Daily  . fluticasone furoate-vilanterol  1 puff Inhalation Daily  . Influenza vac split quadrivalent PF  0.5 mL Intramuscular Tomorrow-1000  . irbesartan  300 mg Oral Daily  . latanoprost  1 drop Both Eyes QHS  . pilocarpine  1 drop Both Eyes Q8H  . warfarin  3.75 mg Oral ONCE-1800  . Warfarin - Pharmacist Dosing Inpatient   Does not apply q1800    Continuous Infusions: . lactated ringers 10 mL/hr at 04/19/17 1458  . lactated ringers 50 mL/hr at 04/20/17 0518     LOS: 3 days     Alma Friendly, MD [facility] Pager [phone]  If 7PM-7AM,  please contact night-coverage [email or web address] Password [email or web address] 04/21/2017, 9:47 AM

## 2017-04-22 DIAGNOSIS — S72002A Fracture of unspecified part of neck of left femur, initial encounter for closed fracture: Secondary | ICD-10-CM

## 2017-04-22 DIAGNOSIS — E039 Hypothyroidism, unspecified: Secondary | ICD-10-CM

## 2017-04-22 LAB — BASIC METABOLIC PANEL
ANION GAP: 8 (ref 5–15)
BUN: 24 mg/dL — ABNORMAL HIGH (ref 6–20)
CALCIUM: 8.6 mg/dL — AB (ref 8.9–10.3)
CHLORIDE: 101 mmol/L (ref 101–111)
CO2: 28 mmol/L (ref 22–32)
Creatinine, Ser: 0.8 mg/dL (ref 0.44–1.00)
GFR calc non Af Amer: >60 mL/min (ref >60)
Glucose, Bld: 85 mg/dL (ref 65–99)
Potassium: 4.1 mmol/L (ref 3.5–5.1)
SODIUM: 137 mmol/L (ref 135–145)

## 2017-04-22 LAB — CBC WITH DIFFERENTIAL/PLATELET
Basophils Absolute: 0 10*3/uL (ref 0.0–0.1)
Basophils Relative: 0 %
EOS ABS: 0.4 10*3/uL (ref 0.0–0.7)
EOS PCT: 5 %
HCT: 33.6 % — ABNORMAL LOW (ref 36.0–46.0)
Hemoglobin: 11.4 g/dL — ABNORMAL LOW (ref 12.0–15.0)
LYMPHS ABS: 2.1 10*3/uL (ref 0.7–4.0)
Lymphocytes Relative: 26 %
MCH: 31.1 pg (ref 26.0–34.0)
MCHC: 33.9 g/dL (ref 30.0–36.0)
MCV: 91.8 fL (ref 78.0–100.0)
Monocytes Absolute: 0.7 10*3/uL (ref 0.1–1.0)
Monocytes Relative: 8 %
NEUTROS PCT: 61 %
Neutro Abs: 5 10*3/uL (ref 1.7–7.7)
PLATELETS: 120 10*3/uL — AB (ref 150–400)
RBC: 3.66 MIL/uL — ABNORMAL LOW (ref 3.87–5.11)
RDW: 15 % (ref 11.5–15.5)
WBC: 8.1 10*3/uL (ref 4.0–10.5)

## 2017-04-22 LAB — PROTIME-INR
INR: 1.28
PROTHROMBIN TIME: 15.9 s — AB (ref 11.4–15.2)

## 2017-04-22 MED ORDER — SENNOSIDES-DOCUSATE SODIUM 8.6-50 MG PO TABS: 2.0000 | ORAL_TABLET | Freq: Every evening | ORAL | 0 refills | Status: DC | PRN

## 2017-04-22 MED ORDER — WARFARIN SODIUM 5 MG PO TABS
5.0000 mg | ORAL_TABLET | Freq: Once | ORAL | Status: AC
  Administered 2017-04-22: 5 mg via ORAL
  Filled 2017-04-22: qty 1

## 2017-04-22 MED ORDER — HYDROCODONE-ACETAMINOPHEN 5-325 MG PO TABS: 1.0000 | ORAL_TABLET | Freq: Four times a day (QID) | ORAL | 0 refills | Status: DC | PRN

## 2017-04-22 NOTE — Progress Notes (Signed)
ANTICOAGULATION CONSULT NOTE - Initial Consult  Pharmacy Consult for Coumadin Indication: atrial fibrillation  Allergies  Allergen Reactions  . Alphagan [Brimonidine] Other (See Comments)    Burning sensation  . Apraclonidine Other (See Comments)    Pain, brow pain, tender, not able to tolerate  . Biaxin [Clarithromycin]     Unknown, per pt   . Brovana [Arformoterol] Other (See Comments)    Makes my heart race  . Budesonide Other (See Comments)    Makes my heart race  . Cefdinir     "FEEL HORRIBLE"  . Cephalexin Itching  . Cephalexin Nausea Only  . Cifenline Itching  . Clonidine Other (See Comments)    unknown  . Clonidine Derivatives Other (See Comments)    REACTION: unknown  . Dorzolamide Other (See Comments)    Redness  . Iohexol Other (See Comments)     Desc: NOTES FROM PRIOR CT STATES PRE MEDICATION PRIOR TO OMNIPAQUE ENHANCED CT   . Nsaids Other (See Comments)    unknown unknown  . Penicillins Itching, Swelling and Other (See Comments)    Swelling and edema of ears and rash Swelling to ears  Has patient had a PCN reaction causing immediate rash, facial/tongue/throat swelling, SOB or lightheadedness with hypotension: {no Has patient had a PCN reaction causing severe rash involving mucus membranes or skin necrosis: {no Has patient had a PCN reaction that required hospitalization no Has patient had a PCN reaction occurring within the last 10 years: no If all of the above answers are "NO", then may proceed with Cephalosporin use.  . Sulfamethoxazole Other (See Comments)    unknown unknown  . Sulfonamide Derivatives Itching  . Tetracycline Itching and Other (See Comments)    unknown  . Timolol Other (See Comments)    Eye red and irritated Eye red and irritated  . Vancomycin Other (See Comments)    unknown Turn red  . Zetia [Ezetimibe] Other (See Comments)    unknown unknown  . Cardizem [Diltiazem Hcl] Rash    Patient Measurements: Height: 5\' 6"  (167.6  cm) Weight: 127 lb (57.6 kg) IBW/kg (Calculated) : 59.3  Assessment: 83 yo F on Coumadin 3.75mg  daily exc for 2.5mg  on MF PTA for afib. CHA2DS2VASc = 6. Coumadin was held for hip surgery. Now restarted on 10/16. INR 1.32 (down). Hgb 10.3 post-op.  Goal of Therapy:  INR 2-3 Monitor platelets by anticoagulation protocol: Yes   Plan:  Give Coumadin 5 mg PO x 1 tonight  Monitor daily INR, CBC, s/s of bleed  Florinda Marker PharmD Candidate 04/22/2017 11:17 AM

## 2017-04-22 NOTE — Progress Notes (Signed)
Physical Therapy Treatment Patient Details Name: [name] MRN: [id number] DOB: 09-22-1923 Today's Date: 04/22/2017    History of Present Illness Pt is a 83 y.o. female admitted to ED on 04/18/17 post-fall while walking at home (denies hitting head or LOC); x-ray showed L hip fx. Now s/p percutaneous fixation of left femoral neck on 04/19/17. Pertinent PMH includes HTN, HLD, neuropathy, TIA, CAD, glaucoma, COPD, anxiety, arthritis.    PT Comments    Pt slowly progressing with mobility. Amb to/from bathroom with RW and min guard for balance; increased time and effort secondary to pain. Pt declining further amb or stair training secondary to fatigue, wanting to return to supine to rest. Mod indep for repositioning in bed with use of bedrails. Daughter present throughout session; both pt and daughter educ on level of assist pt currently requires (min guard), and family's ability to provide this at home. Will plan for stair training tomorrow in order to ensure pt can safely enter home. Still feel HHPT is appropriate since family is able to provide necessary support as pt is at increased risk for falls; pt and daughter in agreement with this. Will continue to follow acutely.    Follow Up Recommendations  Home health PT;Supervision for mobility/OOB     Equipment Recommendations  None recommended by PT    Recommendations for Other Services       Precautions / Restrictions Precautions Precautions: Fall Restrictions Weight Bearing Restrictions: Yes LLE Weight Bearing: Weight bearing as tolerated    Mobility  Bed Mobility Overal bed mobility: Needs Assistance Bed Mobility: Supine to Sit;Sit to Supine     Supine to sit: Min assist Sit to supine: Supervision   General bed mobility comments: MinA for UE support to assist trunk into sitting; no physical assist required for returning to supine. Daughter present to see amount of assist provided. Indep to scoot up in  bed  Transfers Overall transfer level: Needs assistance Equipment used: Rolling walker (2 wheeled) Transfers: Sit to/from Stand Sit to Stand: Min guard         General transfer comment: Stood x2 from bed and again from toilet with RW and min guard; increased time and effort secondary to pain.   Ambulation/Gait Ambulation/Gait assistance: Min guard Ambulation Distance (Feet): 30 Feet Assistive device: Rolling walker (2 wheeled) Gait Pattern/deviations: Step-through pattern;Decreased stride length;Decreased weight shift to left;Antalgic;Trunk flexed Gait velocity: Decreased Gait velocity interpretation: <1.8 ft/sec, indicative of risk for recurrent falls General Gait Details: Amb from bed<>bathroom with RW and min guard; pt spending increased time voiding at toilet, and declining further mobility upon returning to bed for seated rest break secondary to fatigue.    Stairs            Wheelchair Mobility    Modified Rankin (Stroke Patients Only)       Balance Overall balance assessment: Needs assistance Sitting-balance support: Bilateral upper extremity supported;Feet supported Sitting balance-Leahy Scale: Fair     Standing balance support: Bilateral upper extremity supported;During functional activity;Single extremity supported Standing balance-Leahy Scale: Poor Standing balance comment: Able to perform pericare and don underwear standing at toilet with single UE support on RW                            Cognition Arousal/Alertness: Awake/alert Behavior During Therapy: [facility] for tasks assessed/performed Overall Cognitive Status: Within Functional Limits for tasks assessed  Exercises      General Comments General comments (skin integrity, edema, etc.): Daughter present throughout session      Pertinent Vitals/Pain Pain Assessment: Faces Faces Pain Scale: Hurts little more Pain Location: L  hip Pain Descriptors / Indicators: Aching;Discomfort Pain Intervention(s): Monitored during session;Repositioned    Home Living                      Prior Function            PT Goals (current goals can now be found in the care plan section) Acute Rehab PT Goals Patient Stated Goal: Return home with HHPT PT Goal Formulation: With patient Time For Goal Achievement: 05/05/17 Potential to Achieve Goals: Good Progress towards PT goals: Progressing toward goals    Frequency    Min 5X/week      PT Plan Current plan remains appropriate    Co-evaluation              AM-PAC PT "6 Clicks" Daily Activity  Outcome Measure  Difficulty turning over in bed (including adjusting bedclothes, sheets and blankets)?: A Little Difficulty moving from lying on back to sitting on the side of the bed? : Unable Difficulty sitting down on and standing up from a chair with arms (e.g., wheelchair, bedside commode, etc,.)?: A Little Help needed moving to and from a bed to chair (including a wheelchair)?: A Little Help needed walking in hospital room?: A Little Help needed climbing 3-5 steps with a railing? : A Little 6 Click Score: 16    End of Session Equipment Utilized During Treatment: Gait belt Activity Tolerance: Patient tolerated treatment well;Patient limited by fatigue Patient left: in bed;with call bell/phone within reach;with family/visitor present Nurse Communication: Mobility status PT Visit Diagnosis: Other abnormalities of gait and mobility (R26.89);Pain Pain - Right/Left: Left Pain - part of body: Hip     Time: 1316-1400 PT Time Calculation (min) (ACUTE ONLY): 44 min  Charges:  $Gait Training: 8-22 mins $Therapeutic Activity: 8-22 mins $Self Care/Home Management: 8-22                    G Codes:      Mabeline Caras, PT, DPT Acute Rehab Services  Pager: [place] 04/22/2017, 2:13 PM

## 2017-04-22 NOTE — Plan of Care (Signed)
Problem: Pain Management: Goal: Pain level will decrease Patient is able to rate pain on scale of 1-10

## 2017-04-22 NOTE — Plan of Care (Signed)
Problem: Activity: Goal: Risk for activity intolerance will decrease Outcome: Progressing Up with PT 10/17- pt. seem very motivated.

## 2017-04-22 NOTE — Discharge Summary (Addendum)
Discharge Summary  [name] KVQ:[id number] DOB: March 15, 1924  PCP: Sandi Mariscal, MD  Admit date: 04/18/2017 Discharge date: 04/23/2017  Time spent: Greater than 62mins  Recommendations for Outpatient Follow-up:  DISCUSSED WITH PATIENT AND DAUGHTER, PT SHOULD TAKE COUMADIN 7.5MG  (3 TABS), TODAY 10/19, 10/20, 10/21. FROM Monday 10/22 PLEASE GO BACK TO REGULAR DOSING. ADVISED TO GET INR CHECK ON Monday 10/22 AND PCP TO FOLLOW UP ON RESULT. PT ADVISED TO GO TO THE ED IF ANY SIGNS OF BLEEDING.  PCP in 1 week Orthopedics in 3-4 weeks Home PT  Discharge Diagnoses:  Active Hospital Problems   Diagnosis Date Noted  . Closed left hip fracture, initial encounter ([place]) 04/18/2017  . On continuous oral anticoagulation   . Hip fracture ([place]) 04/19/2017  . Adult hypothyroidism 09/21/2016  . Essential hypertension 03/22/2014  . Persistent atrial fibrillation ([place])   . CAD (coronary artery disease) 09/24/2012    Resolved Hospital Problems   Diagnosis Date Noted Date Resolved  No resolved problems to display.    Discharge Condition: Stable  Diet recommendation: Regular   Vitals:   04/22/17 1025 04/22/17 1326  BP: (!) 115/52 121/67  Pulse:  82  Resp:  15  Temp:  98.7 F (37.1 C)  SpO2:  98%    History of present illness:  83 year old female with past medical history of A. fib, CAD status post PCI in 1997, hypertension, asthma, presented to the ED after patient had a mechanical fall while walking her dog on 04/18/17.  Patient sustained a closed left hip fracture status post repair of the left hip fracture on 10/15.  Patient remained stable throughout the admission, pain management was adequate, participated in PT OT and is stable to be discharged back to home upon patient's request.   Patient denies any new complaints, denies pain around the left hip, denies chest pain, cough, shortness of breath, abdominal pain, nausea, vomiting, dizziness, afebrile. Patient eating and  drinking well.   Hospital Course:  Principal Problem:   Closed left hip fracture, initial encounter [facility]) Active Problems:   CAD (coronary artery disease)   Persistent atrial fibrillation ([facility])   Essential hypertension   Adult hypothyroidism   Hip fracture ([facility])   On continuous oral anticoagulation  #Closed left hip fracture status post repair 10/15 Stable Pain management, discharge patient on Norco 5 for severe pain and Tylenol for moderate pain Anticoagulation with Coumadin Home PT ordered, with home health Weightbearing as tolerated Follow-up with orthopedics in 3-4 weeks  #Leukocytosis Resolved, likely reactive secondary to surgery Afebrile, asymptomatic  #Persistent A. fib Heart rate controlled [place] greater than 6 Continue home dose Coumadin  #Hypertension Controlled Continue Norvasc 10, Avapro 300 daily  #Asthma severe persistent Stable Continue inhalers, Breo, Proventil   Procedures:  Left hip repair 10/15  Consultations:  Orthopedics, Dr. Katha Hamming  Discharge Exam: BP 121/67 (BP Location: Right Arm)   Pulse 82   Temp 98.7 F (37.1 C) (Oral)   Resp 15   Ht 5\' 6"  (1.676 m)   Wt 57.6 kg (127 lb)   SpO2 98%   BMI 20.50 kg/m   General: Alert, awake, oriented x3 Cardiovascular: S1-S2 present no murmurs no rubs no gallops Respiratory: Chest clear bilaterally  Discharge Instructions:  DISCUSSED WITH PATIENT AND DAUGHTER, PT SHOULD TAKE COUMADIN 7.5MG  (3 TABS), TODAY 10/19, 10/20, 10/21. FROM Monday 10/22 PLEASE GO BACK TO REGULAR DOSING. ADVISED TO GET INR CHECK ON Monday 10/22 AND PCP TO FOLLOW UP.   You were cared  for by a hospitalist during your hospital stay. If you have any questions about your discharge medications or the care you received while you were in the hospital after you are discharged, you can call the unit and asked to speak with the hospitalist on call if the hospitalist that took care of you is not available. Once you are  discharged, your primary care physician will handle any further medical issues. Please note that NO REFILLS for any discharge medications will be authorized once you are discharged, as it is imperative that you return to your primary care physician (or establish a relationship with a primary care physician if you do not have one) for your aftercare needs so that they can reassess your need for medications and monitor your lab values.  Discharge Instructions    Diet - low sodium heart healthy    Complete by:  As directed    Discharge instructions    Complete by:  As directed    Weight bearing as tolerated   Increase activity slowly    Complete by:  As directed      Allergies as of 04/22/2017      Reactions   Alphagan [brimonidine] Other (See Comments)   Burning sensation   Apraclonidine Other (See Comments)   Pain, brow pain, tender, not able to tolerate   Biaxin [clarithromycin]    Unknown, per pt   Brovana [arformoterol] Other (See Comments)   Makes my heart race   Budesonide Other (See Comments)   Makes my heart race   Cefdinir    "FEEL HORRIBLE"   Cephalexin Itching   Cephalexin Nausea Only   Cifenline Itching   Clonidine Other (See Comments)   unknown   Clonidine Derivatives Other (See Comments)   REACTION: unknown   Dorzolamide Other (See Comments)   Redness   Iohexol Other (See Comments)    Desc: NOTES FROM PRIOR CT STATES PRE MEDICATION PRIOR TO OMNIPAQUE ENHANCED CT   Nsaids Other (See Comments)   unknown unknown   Penicillins Itching, Swelling, Other (See Comments)   Swelling and edema of ears and rash Swelling to ears Has patient had a PCN reaction causing immediate rash, facial/tongue/throat swelling, SOB or lightheadedness with hypotension: {no Has patient had a PCN reaction causing severe rash involving mucus membranes or skin necrosis: {no Has patient had a PCN reaction that required hospitalization no Has patient had a PCN reaction occurring within the  last 10 years: no If all of the above answers are "NO", then may proceed with Cephalosporin use.   Sulfamethoxazole Other (See Comments)   unknown unknown   Sulfonamide Derivatives Itching   Tetracycline Itching, Other (See Comments)   unknown   Timolol Other (See Comments)   Eye red and irritated Eye red and irritated   Vancomycin Other (See Comments)   unknown Turn red   Zetia [ezetimibe] Other (See Comments)   unknown unknown   Cardizem [diltiazem Hcl] Rash      Medication List    TAKE these medications   acetaminophen 325 MG tablet Commonly known as:  TYLENOL Take 325 mg by mouth every 6 (six) hours as needed for moderate pain.   albuterol 108 (90 Base) MCG/ACT inhaler Commonly known as:  PROAIR HFA Inhale 1-2 puffs into the lungs every 4 (four) hours as needed for wheezing or shortness of breath.   ALPRAZolam 0.25 MG tablet Commonly known as:  XANAX Take 1 tablet (0.25 mg total) by mouth at bedtime as needed for anxiety.  What changed:  when to take this   amLODipine 10 MG tablet Commonly known as:  NORVASC Take 1 tablet (10 mg total) by mouth daily.   cetirizine 10 MG tablet Commonly known as:  ZYRTEC Take 10 mg by mouth daily as needed for allergies.   fluticasone 50 MCG/ACT nasal spray Commonly known as:  FLONASE Place 1 spray into both nostrils daily as needed for allergies or rhinitis.   fluticasone furoate-vilanterol 200-25 MCG/INH Aepb Commonly known as:  BREO ELLIPTA Inhale 1 puff into the lungs daily.   furosemide 40 MG tablet Commonly known as:  LASIX Take 40 mg by mouth daily as needed for fluid.   HYDROcodone-acetaminophen 5-325 MG tablet Commonly known as:  NORCO Take 1 tablet by mouth every 6 (six) hours as needed for severe pain.   irbesartan 300 MG tablet Commonly known as:  AVAPRO Take 1 tablet (300 mg total) by mouth daily.   latanoprost 0.005 % ophthalmic solution Commonly known as:  XALATAN Place 1 drop into both eyes at  bedtime.   MULTIVITAMIN & MINERAL PO Take 1 tablet by mouth daily.   pilocarpine 1 % ophthalmic solution Commonly known as:  PILOCAR Place 1 drop into both eyes 3 times daily.   senna-docusate 8.6-50 MG tablet Commonly known as:  Senokot-S Take 2 tablets by mouth at bedtime as needed for mild constipation.   Vitamin D 2000 units tablet Take 2,000 Units by mouth daily.   warfarin 2.5 MG tablet Commonly known as:  COUMADIN Take 2.5-3.75 mg by mouth daily. Take 3.75 mg (1.5 tablets) on Sun, Tues, Weds, Thurs, Sat & Take 2.5 mg (1 tablet) on Mon & Fri      Allergies  Allergen Reactions  . Alphagan [Brimonidine] Other (See Comments)    Burning sensation  . Apraclonidine Other (See Comments)    Pain, brow pain, tender, not able to tolerate  . Biaxin [Clarithromycin]     Unknown, per pt   . Brovana [Arformoterol] Other (See Comments)    Makes my heart race  . Budesonide Other (See Comments)    Makes my heart race  . Cefdinir     "FEEL HORRIBLE"  . Cephalexin Itching  . Cephalexin Nausea Only  . Cifenline Itching  . Clonidine Other (See Comments)    unknown  . Clonidine Derivatives Other (See Comments)    REACTION: unknown  . Dorzolamide Other (See Comments)    Redness  . Iohexol Other (See Comments)     Desc: NOTES FROM PRIOR CT STATES PRE MEDICATION PRIOR TO OMNIPAQUE ENHANCED CT   . Nsaids Other (See Comments)    unknown unknown  . Penicillins Itching, Swelling and Other (See Comments)    Swelling and edema of ears and rash Swelling to ears  Has patient had a PCN reaction causing immediate rash, facial/tongue/throat swelling, SOB or lightheadedness with hypotension: {no Has patient had a PCN reaction causing severe rash involving mucus membranes or skin necrosis: {no Has patient had a PCN reaction that required hospitalization no Has patient had a PCN reaction occurring within the last 10 years: no If all of the above answers are "NO", then may proceed with  Cephalosporin use.  . Sulfamethoxazole Other (See Comments)    unknown unknown  . Sulfonamide Derivatives Itching  . Tetracycline Itching and Other (See Comments)    unknown  . Timolol Other (See Comments)    Eye red and irritated Eye red and irritated  . Vancomycin Other (See Comments)  unknown Turn red  . Zetia [Ezetimibe] Other (See Comments)    unknown unknown  . Cardizem [Diltiazem Hcl] Rash   Follow-up Information    [facility], [facility] Follow up.   Specialty:  [place] Why:  A representative from [facility] will contact you to arrange start date and time for your therapy. Contact information: [place] [address] [phone]        Sandi Mariscal, MD. Schedule an appointment as soon as possible for a visit in 1 week(s).   Specialty:  Internal Medicine Contact information: [place] [address] [phone]        Shona Needles, MD. Schedule an appointment as soon as possible for a visit in 3 week(s).   Specialty:  Orthopedic Surgery Contact information: [address] [place] [address] [phone]            The results of significant diagnostics from this hospitalization (including imaging, microbiology, ancillary and laboratory) are listed below for reference.    Significant Diagnostic Studies: Dg Pelvis Portable  Result Date: 04/19/2017 CLINICAL DATA:  83 year old female status post ORIF for left femur fracture. EXAM: PORTABLE PELVIS 1-2 VIEWS COMPARISON:  04/19/2017. FINDINGS: Single view of the pelvis demonstrates interval placement of 3 cannulated fixation screws traversing the previously noted left femoral neck fracture. Near anatomic alignment has been achieved. Left femoral head projects over the left acetabulum on this single view examination. Bony pelvis appears intact. Degenerative changes of mild to moderate osteoarthritis are noted in the right hip  joint. IMPRESSION: 1. Postoperative changes of ORIF in the left hip, as above. Electronically Signed   By: Vinnie Langton M.D.   On: 04/19/2017 19:43   Dg Chest Port 1 View  Result Date: 04/18/2017 CLINICAL DATA:  Left hip fracture EXAM: PORTABLE CHEST 1 VIEW COMPARISON:  01/27/2017 FINDINGS: A single AP portable view of the chest demonstrates no focal airspace consolidation or alveolar edema. The lungs are grossly clear. Shallow inspiration. There is no large effusion or pneumothorax. Cardiac and mediastinal contours appear unremarkable. IMPRESSION: No active disease. Electronically Signed   By: Andreas Newport M.D.   On: 04/18/2017 23:04   Dg Knee Complete 4 Views Left  Result Date: 04/18/2017 CLINICAL DATA:  Left knee gave away and patient fell at home tonight. Left hip and left knee pain. EXAM: LEFT KNEE - COMPLETE 4+ VIEW COMPARISON:  Left femur 02/28/2014 FINDINGS: Degenerative changes in the left knee with medial and lateral compartment narrowing as well as tricompartment osteophyte formation. No evidence of acute fracture or dislocation. No focal bone lesion or bone destruction. Old ununited ossicle posterior to the femoral heads may represent a loose body and is increased in size since previous study. No significant effusion. Prominent vascular calcifications. IMPRESSION: Mild to moderate tricompartment degenerative changes in the left knee. Old ununited ossicle posterior to the femoral heads is increased in size since previous study and may represent a chronic loose body. No significant effusion. Electronically Signed   By: Lucienne Capers M.D.   On: 04/18/2017 22:27   Dg C-arm 1-60 Min  Result Date: 04/19/2017 CLINICAL DATA:  Percutaneous fixation of left femoral neck fracture. EXAM: DG C-ARM 61-120 MIN; OPERATIVE LEFT HIP WITH PELVIS COMPARISON:  04/18/2017 FINDINGS: A total of 45 seconds of fluoroscopic time was utilized during placement of 3 cannulated screws across a valgus  angulated subcapital left femoral neck fracture. Fine bony detail is limited by  the fluoroscopic technique. No immediate postoperative complications. IMPRESSION: Status post percutaneous fixation of left subcapital femoral neck fracture with 3 cannulated screws. Electronically Signed   By: Ashley Royalty M.D.   On: 04/19/2017 17:32   Dg Hip Operative Unilat W Or W/o Pelvis Left  Result Date: 04/19/2017 CLINICAL DATA:  Percutaneous fixation of left femoral neck fracture. EXAM: DG C-ARM 61-120 MIN; OPERATIVE LEFT HIP WITH PELVIS COMPARISON:  04/18/2017 FINDINGS: A total of 45 seconds of fluoroscopic time was utilized during placement of 3 cannulated screws across a valgus angulated subcapital left femoral neck fracture. Fine bony detail is limited by the fluoroscopic technique. No immediate postoperative complications. IMPRESSION: Status post percutaneous fixation of left subcapital femoral neck fracture with 3 cannulated screws. Electronically Signed   By: Ashley Royalty M.D.   On: 04/19/2017 17:32   Dg Hip Unilat W Or W/o Pelvis 2-3 Views Left  Result Date: 04/18/2017 CLINICAL DATA:  Patient fell at home tonight.  Left hip pain. EXAM: DG HIP (WITH OR WITHOUT PELVIS) 2-3V LEFT COMPARISON:  None. FINDINGS: There is a transverse subcapital fracture of the left femoral neck with valgus angulation of the fracture fragments. Mild impaction and mild medial displacement of the distal fracture fragment. No dislocation at the hip joint. Prominent degenerative changes in the hip joint with hypertrophic changes off the lateral acetabular rim. The pelvis appears intact. SI joints and symphysis pubis are not displaced. Degenerative changes in the lower lumbar spine and right hip. Vascular calcifications. IMPRESSION: Transverse subcapital fracture of the left femoral neck with valgus angulation. Electronically Signed   By: Lucienne Capers M.D.   On: 04/18/2017 22:28   Dg Femur Port Min 2 Views Left  Result Date:  04/19/2017 CLINICAL DATA:  Left femoral fracture. EXAM: LEFT FEMUR PORTABLE 2 VIEWS COMPARISON:  None. FINDINGS: Three cannulated screws traverse a subcapital left femoral neck fracture with slight valgus angulation. Osteoarthritic joint space narrowing of the patellofemoral and femorotibial compartments of the knee are identified without significant joint effusion. There is arteriosclerosis of the common femoral through below-knee popliteal arteries. IMPRESSION: 1. Left subcapital femoral neck fracture fixation with 3 cannulated screws. 2. Tricompartmental osteoarthritis of the left knee. Electronically Signed   By: Ashley Royalty M.D.   On: 04/19/2017 19:52    Microbiology: Recent Results (from the past 240 hour(s))  Surgical pcr screen     Status: None   Collection Time: 04/19/17  2:32 AM  Result Value Ref Range Status   MRSA, PCR NEGATIVE NEGATIVE Final   Staphylococcus aureus NEGATIVE NEGATIVE Final    Comment: (NOTE) The Xpert SA Assay (FDA approved for NASAL specimens in patients 27 years of age and older), is one component of a comprehensive surveillance program. It is not intended to diagnose infection nor to guide or monitor treatment.      Labs: Basic Metabolic Panel:  Recent Labs Lab 04/18/17 2234 04/19/17 0342 04/21/17 0504 04/22/17 0343  NA 133* 137 137 137  K 3.7 4.0 4.0 4.1  CL 101 104 101 101  CO2 23 27 29 28   GLUCOSE 128* 109* 101* 85  BUN 21* 18 23* 24*  CREATININE 0.85 0.71 0.80 0.80  CALCIUM 8.6* 8.5* 8.6* 8.6*   Liver Function Tests:  Recent Labs Lab 04/18/17 2234 04/21/17 0504  AST 25 25  ALT 18 14  ALKPHOS 45 40  BILITOT 0.8 0.5  PROT 6.6 5.6*  ALBUMIN 3.7 3.0*   No results for input(s): LIPASE, AMYLASE in the last 168  hours. No results for input(s): AMMONIA in the last 168 hours. CBC:  Recent Labs Lab 04/18/17 2234 04/19/17 0342 04/21/17 0504 04/22/17 1055  WBC 11.8* 11.4* 14.1* 8.1  NEUTROABS 9.9*  --  11.0* 5.0  HGB 12.0 10.8*  10.3* 11.4*  HCT 35.2* 32.5* 30.5* 33.6*  MCV 90.3 91.8 90.5 91.8  PLT 123* 154 130* 120*   Cardiac Enzymes: No results for input(s): CKTOTAL, CKMB, CKMBINDEX, TROPONINI in the last 168 hours. BNP: BNP (last 3 results) No results for input(s): BNP in the last 8760 hours.  ProBNP (last 3 results) No results for input(s): PROBNP in the last 8760 hours.  CBG: No results for input(s): GLUCAP in the last 168 hours.     Signed:  Alma Friendly, MD [facility] 04/22/2017, 4:45 PM

## 2017-04-22 NOTE — Care Management Important Message (Signed)
Important Message  Patient Details  Name: [name] MRN: [id number] Date of Birth: 1924/02/07   Medicare Important Message Given:  Yes    [name] [address] 04/22/2017, 12:58 PM

## 2017-04-23 ENCOUNTER — Ambulatory Visit: Payer: Medicare HMO | Admitting: Pharmacist

## 2017-04-23 LAB — PROTIME-INR
INR: 1.33
PROTHROMBIN TIME: 16.4 s — AB (ref 11.4–15.2)

## 2017-04-23 MED ORDER — WARFARIN SODIUM 2.5 MG PO TABS: 2.5000 mg | ORAL_TABLET | Freq: Every day | ORAL | 0 refills | Status: DC

## 2017-04-23 MED ORDER — INFLUENZA VAC SPLIT HIGH-DOSE 0.5 ML IM SUSY
0.5000 mL | PREFILLED_SYRINGE | Freq: Once | INTRAMUSCULAR | Status: AC
  Administered 2017-04-23: 0.5 mL via INTRAMUSCULAR
  Filled 2017-04-23: qty 0.5

## 2017-04-23 NOTE — Progress Notes (Deleted)
Patient ID: Janice MONDA                 DOB: 07/17/1923                      MRN: [id number]     HPI: [name] is a 83 y.o. female referred by Dr. Radford Pax to HTN clinic. PMH is significant for ASCVD s/p remote PCI of the diag in 1997, persistent afib, TIA, HTN, and HLD. Pt's BP was controlled at last visit, however pt was affected by the valsartan recall. Her Exforge was stopped and she was started on amlodipine 10mg  daily and irbesartan 300mg  daily. She presents today for follow up.   She has chronic DOE with exertion but it is stable and does not occur on a daily basis.  She has chronic LE edema which is unchanged. She is compliant with her meds and is tolerating meds with no SE.     Currently admitted - fell 10/14 while walking her dog. Hip fractured and repaired on 10/15. BP 123/55 in patient today, BMET stable  Current HTN meds: amlodipine 10mg  daily, irbesartan 300mg  daily Previously tried:  BP goal: <150/4mmHg due to age  Family History: Heart attack in her brother and father; Heart disease in her brother and father; Hemachromatosis in her father; Lymphoma in her sister; Peripheral vascular disease in her mother and sister.   Social History: Former smoker for 25 years, quit in 1968. Denies alcohol and illicit drug use.  Diet:   Exercise:   Home BP readings:   Wt Readings from Last 3 Encounters:  04/18/17 127 lb (57.6 kg)  04/01/17 128 lb 9.6 oz (58.3 kg)  02/17/17 128 lb (58.1 kg)   BP Readings from Last 3 Encounters:  04/23/17 (!) 123/55  04/01/17 126/60  02/17/17 118/60   Pulse Readings from Last 3 Encounters:  04/23/17 67  04/01/17 78  02/17/17 83    Renal function: Estimated Creatinine Clearance: 41.7 mL/min (by C-G formula based on SCr of 0.8 mg/dL).  Past Medical History:  Diagnosis Date  . Anxiety   . Arthritis   . CAD (coronary artery disease) 2007   a. 1997 s/p PCI of diagonal.  . Constipation   . COPD/Asthma   .  Diverticulosis   . Esophageal reflux   . Family history of adverse reaction to anesthesia    daughter had trouble breathing after surgery, had to be reintubated  . Glaucoma   . HTN (hypertension), benign   . Hyperlipidemia   . Malignant melanoma ([place])    a. 09/2012 s/p resection.  . Neuropathy    lower legs  . PAF (paroxysmal atrial fibrillation) (HCC)    a. intolerant to beta blockers due to asthma and intolerant to CCB due to rash;  b. CHA2DS2VASc = 6-->chronic coumadin;  c. 03/2014 Echo: EF 60-65%, Gr 1 DD, triv AI, mildly dil RV, PASP 51mmhg.  [name] TIA (transient ischemic attack)    a. 02/2011 - chronic coumadin in setting of PAF.  [name] Urinary, incontinence, stress female     Current Facility-Administered Medications on File Prior to Visit  Medication Dose Route Frequency Provider Last Rate Last Dose  . albuterol (PROVENTIL) (2.5 MG/3ML) 0.083% nebulizer solution 2.5 mg  2.5 mg Inhalation Q4H PRN Rise Patience, MD      . ALPRAZolam Duanne Moron) tablet 0.25 mg  0.25 mg Oral BID PRN Nita Sells, MD   0.25 mg at 04/22/17 2207  . amLODipine (  NORVASC) tablet 10 mg  10 mg Oral Daily Rise Patience, MD   10 mg at 04/22/17 1025  . cholecalciferol (VITAMIN D) tablet 2,000 Units  2,000 Units Oral Daily Rise Patience, MD   2,000 Units at 04/22/17 1025  . feeding supplement (ENSURE ENLIVE) (ENSURE ENLIVE) liquid 237 mL  237 mL Oral BID BM Nita Sells, MD   237 mL at 04/22/17 1547  . fluticasone (FLONASE) 50 MCG/ACT nasal spray 2 spray  2 spray Each Nare Daily Nita Sells, MD   2 spray at 04/22/17 1545  . fluticasone furoate-vilanterol (BREO ELLIPTA) 200-25 MCG/INH 1 puff  1 puff Inhalation Daily Rise Patience, MD   1 puff at 04/22/17 [phone]  . Influenza vac split quadrivalent PF (FLUZONE HIGH-DOSE) injection 0.5 mL  0.5 mL Intramuscular Tomorrow-1000 Rise Patience, MD      . irbesartan (AVAPRO) tablet 300 mg  300 mg Oral Daily Rise Patience, MD    300 mg at 04/22/17 1025  . lactated ringers infusion   Intravenous Continuous Roberts Gaudy, MD 10 mL/hr at 04/19/17 1458    . lactated ringers infusion   Intravenous Continuous Roberts Gaudy, MD 50 mL/hr at 04/20/17 0518    . latanoprost (XALATAN) 0.005 % ophthalmic solution 1 drop  1 drop Both Eyes QHS Rise Patience, MD   1 drop at 04/22/17 2200  . morphine 4 MG/ML injection 0.52 mg  0.52 mg Intravenous Q2H PRN Rise Patience, MD   0.52 mg at 04/20/17 1915  . pilocarpine (PILOCAR) 1 % ophthalmic solution 1 drop  1 drop Both Eyes Q8H Samtani, Jai-Gurmukh, MD   1 drop at 04/23/17 0600  . senna-docusate (Senokot-S) tablet 2 tablet  2 tablet Oral QHS PRN Alma Friendly, MD   2 tablet at 04/22/17 [phone]  . Warfarin - Pharmacist Dosing Inpatient   Does not apply q1800 Karren Cobble [facility]       Current Outpatient Prescriptions on File Prior to Visit  Medication Sig Dispense Refill  . acetaminophen (TYLENOL) 325 MG tablet Take 325 mg by mouth every 6 (six) hours as needed for moderate pain.     [name] albuterol (PROAIR HFA) 108 (90 Base) MCG/ACT inhaler Inhale 1-2 puffs into the lungs every 4 (four) hours as needed for wheezing or shortness of breath. 1 Inhaler 3  . ALPRAZolam (XANAX) 0.25 MG tablet Take 1 tablet (0.25 mg total) by mouth at bedtime as needed for anxiety. (Patient taking differently: Take 0.25 mg by mouth 2 (two) times daily as needed for anxiety. ) 30 tablet 0  . amLODipine (NORVASC) 10 MG tablet Take 1 tablet (10 mg total) by mouth daily. 30 tablet 11  . cetirizine (ZYRTEC) 10 MG tablet Take 10 mg by mouth daily as needed for allergies.    . Cholecalciferol (VITAMIN D) 2000 units tablet Take 2,000 Units by mouth daily.    . fluticasone (FLONASE) 50 MCG/ACT nasal spray Place 1 spray into both nostrils daily as needed for allergies or rhinitis. 16 g 6  . fluticasone furoate-vilanterol (BREO ELLIPTA) 200-25 MCG/INH AEPB Inhale 1 puff into the lungs daily. 60 each 11  .  furosemide (LASIX) 40 MG tablet Take 40 mg by mouth daily as needed for fluid.     [name] HYDROcodone-acetaminophen (NORCO) 5-325 MG tablet Take 1 tablet by mouth every 6 (six) hours as needed for severe pain. 20 tablet 0  . irbesartan (AVAPRO) 300 MG tablet Take 1 tablet (300 mg total) by  mouth daily. 30 tablet 11  . latanoprost (XALATAN) 0.005 % ophthalmic solution Place 1 drop into both eyes at bedtime.    . Multiple Vitamins-Minerals (MULTIVITAMIN & MINERAL PO) Take 1 tablet by mouth daily.    . pilocarpine (PILOCAR) 1 % ophthalmic solution Place 1 drop into both eyes 3 times daily.    [name] senna-docusate (SENOKOT-S) 8.6-50 MG tablet Take 2 tablets by mouth at bedtime as needed for mild constipation. 30 tablet 0  . warfarin (COUMADIN) 2.5 MG tablet Take 2.5-3.75 mg by mouth daily. Take 3.75 mg (1.5 tablets) on Sun, Tues, Weds, Thurs, Sat & Take 2.5 mg (1 tablet) on Mon & Fri      Allergies  Allergen Reactions  . Alphagan [Brimonidine] Other (See Comments)    Burning sensation  . Apraclonidine Other (See Comments)    Pain, brow pain, tender, not able to tolerate  . Biaxin [Clarithromycin]     Unknown, per pt   . Brovana [Arformoterol] Other (See Comments)    Makes my heart race  . Budesonide Other (See Comments)    Makes my heart race  . Cefdinir     "FEEL HORRIBLE"  . Cephalexin Itching  . Cephalexin Nausea Only  . Cifenline Itching  . Clonidine Other (See Comments)    unknown  . Clonidine Derivatives Other (See Comments)    REACTION: unknown  . Dorzolamide Other (See Comments)    Redness  . Iohexol Other (See Comments)     Desc: NOTES FROM PRIOR CT STATES PRE MEDICATION PRIOR TO OMNIPAQUE ENHANCED CT   . Nsaids Other (See Comments)    unknown unknown  . Penicillins Itching, Swelling and Other (See Comments)    Swelling and edema of ears and rash Swelling to ears  Has patient had a PCN reaction causing immediate rash, facial/tongue/throat swelling, SOB or lightheadedness with  hypotension: {no Has patient had a PCN reaction causing severe rash involving mucus membranes or skin necrosis: {no Has patient had a PCN reaction that required hospitalization no Has patient had a PCN reaction occurring within the last 10 years: no If all of the above answers are "NO", then may proceed with Cephalosporin use.  . Sulfamethoxazole Other (See Comments)    unknown unknown  . Sulfonamide Derivatives Itching  . Tetracycline Itching and Other (See Comments)    unknown  . Timolol Other (See Comments)    Eye red and irritated Eye red and irritated  . Vancomycin Other (See Comments)    unknown Turn red  . Zetia [Ezetimibe] Other (See Comments)    unknown unknown  . Cardizem [Diltiazem Hcl] Rash     Assessment/Plan:  1. Hypertension -

## 2017-04-23 NOTE — Progress Notes (Signed)
[name] was seen and examined today. No new changes about medical condition, pt remained stable and is ready for discharge.  INR remained sub-therapeutic at 1.33. Spoke to pharmacy, rec increasing coumadin to 7.5mg  daily (3 tabs) for today 10/19, 10/20, 10/21, and resuming current schedule on 10/22. Pt advised to check her INR on 10/22 and follow up with PCP and adjust accordingly.

## 2017-04-23 NOTE — Progress Notes (Signed)
Patient has met discharge criteria at this time. Discharge instructions discussed with patient and family.  Patient and family verbalized understanding of instructions.  Paper prescriptions given to patient for following medications: Coumadin, Norco .  Patient instructed to call office to schedule follow up appointment with surgeon. Denies any further questions or concerns.  Patient discharged home with family via wheelchair.  Instructed to call office with any further questions or concerns. Flu shot given prior to discharge.

## 2017-04-23 NOTE — Progress Notes (Signed)
Physical Therapy Treatment Patient Details Name: [name] MRN: [id number] DOB: 1923/08/03 Today's Date: 04/23/2017    History of Present Illness Pt is a 83 y.o. female admitted to ED on 04/18/17 post-fall while walking at home (denies hitting head or LOC); x-ray showed L hip fx. Now s/p percutaneous fixation of left femoral neck on 04/19/17. Pertinent PMH includes HTN, HLD, neuropathy, TIA, CAD, glaucoma, COPD, anxiety, arthritis.    PT Comments    Pt with improved ambulation distance this session with RW and supervision; declining further mobility and stair training secondary to fatigue. Daughter present throughout session and feels prepared to assist pt at home; reviewed therex, fall risk reduction, stair technique, and importance of mobility. Feel pt is safe to return home with supervision from family and HHPT. Will continue to follow acutely to maximize functional mobility and independence.    Follow Up Recommendations  Home health PT;Supervision for mobility/OOB     Equipment Recommendations  None recommended by PT    Recommendations for Other Services       Precautions / Restrictions Precautions Precautions: Fall Restrictions Weight Bearing Restrictions: Yes LLE Weight Bearing: Weight bearing as tolerated    Mobility  Bed Mobility Overal bed mobility: Independent Bed Mobility: Rolling;Supine to Sit;Sit to Supine              Transfers Overall transfer level: Modified independent Equipment used: Rolling walker (2 wheeled) Transfers: Sit to/from Stand              Ambulation/Gait Ambulation/Gait assistance: Supervision Ambulation Distance (Feet): 80 Feet Assistive device: Rolling walker (2 wheeled) Gait Pattern/deviations: Step-through pattern;Decreased stride length;Decreased weight shift to left;Antalgic;Trunk flexed Gait velocity: Decreased Gait velocity interpretation: <1.8 ft/sec, indicative of risk for recurrent falls General Gait  Details: Improved amb distance this session with RW and supervision; intermittent cues for bilat knee ext and upright posture. Pt declining further amb and stair training secondary to pain   Stairs            Wheelchair Mobility    Modified Rankin (Stroke Patients Only)       Balance Overall balance assessment: Needs assistance Sitting-balance support: Bilateral upper extremity supported;Feet supported Sitting balance-Leahy Scale: Fair     Standing balance support: Bilateral upper extremity supported;During functional activity;Single extremity supported Standing balance-Leahy Scale: Poor                              Cognition Arousal/Alertness: Awake/alert Behavior During Therapy: WFL for tasks assessed/performed Overall Cognitive Status: Within Functional Limits for tasks assessed                                        Exercises Total Joint Exercises Long Arc Quad: AROM;Both;20 reps;Seated Marching in Standing: AROM;Both;10 reps;Seated    General Comments General comments (skin integrity, edema, etc.): Daughter present throughout session      Pertinent Vitals/Pain Pain Assessment: Faces Faces Pain Scale: Hurts a little bit Pain Location: L hip Pain Descriptors / Indicators: Aching;Discomfort Pain Intervention(s): Monitored during session    Home Living                      Prior Function            PT Goals (current goals can now be found in the care plan section) Acute Rehab PT  Goals Patient Stated Goal: Return home with HHPT PT Goal Formulation: With patient Time For Goal Achievement: 05/05/17 Potential to Achieve Goals: Good Progress towards PT goals: Progressing toward goals    Frequency    Min 5X/week      PT Plan Current plan remains appropriate    Co-evaluation              AM-PAC PT "6 Clicks" Daily Activity  Outcome Measure  Difficulty turning over in bed (including adjusting  bedclothes, sheets and blankets)?: None Difficulty moving from lying on back to sitting on the side of the bed? : None Difficulty sitting down on and standing up from a chair with arms (e.g., wheelchair, bedside commode, etc,.)?: None Help needed moving to and from a bed to chair (including a wheelchair)?: A Little Help needed walking in hospital room?: A Little Help needed climbing 3-5 steps with a railing? : A Little 6 Click Score: 21    End of Session Equipment Utilized During Treatment: Gait belt Activity Tolerance: Patient tolerated treatment well Patient left: in bed;with call bell/phone within reach;with family/visitor present;with nursing/sitter in room Nurse Communication: Mobility status PT Visit Diagnosis: Other abnormalities of gait and mobility (R26.89);Pain Pain - Right/Left: Left Pain - part of body: Hip     Time: 0086-7619 PT Time Calculation (min) (ACUTE ONLY): 14 min  Charges:  $Gait Training: 8-22 mins                    G Codes:      Mabeline Caras, PT, DPT Acute Rehab Services  Pager: [place] 04/23/2017, 10:12 AM

## 2017-04-23 NOTE — Progress Notes (Signed)
Spoke with Dr. Horris Latino regarding home Coumadin dosing. INR not increasing and was sub therapeutic on admission. Recommend 7.5mg  po daily x 3 days with repeat INR on Monday.  Dalyce Renne S. Alford Highland, PharmD, [place] Clinical Staff Pharmacist Pager [phone]

## 2017-06-18 ENCOUNTER — Other Ambulatory Visit: Payer: Self-pay | Admitting: Pulmonary Disease

## 2017-06-28 ENCOUNTER — Other Ambulatory Visit: Payer: Self-pay | Admitting: Student

## 2017-06-28 DIAGNOSIS — S83249A Other tear of medial meniscus, current injury, unspecified knee, initial encounter: Secondary | ICD-10-CM

## 2017-07-01 ENCOUNTER — Telehealth: Payer: Self-pay | Admitting: *Deleted

## 2017-07-01 NOTE — Telephone Encounter (Addendum)
I called Mrs. Vieau to find out why she wasn't taking her xolair. [facility] was charging her $160 a month. "I can't afford that." She told [employer] to stop sending Lexicographer. I didn't know about it until I talked to her today. Had I known we would have called [employer] for assistance. I put in [facility] note pt. Stopped Lexicographer. Pt.'s last shot was in 02/2017, she has not had an asthma attacks and is doing well as far as her breathing is concerned. Nothing further needed.

## 2017-08-27 ENCOUNTER — Telehealth: Payer: Self-pay | Admitting: Cardiology

## 2017-08-27 NOTE — Telephone Encounter (Signed)
Spoke with patient. Informed patient to reach out to her pharmacy regarding recall on medication. Patient stated she will call [facility] and call office back with any questions or concerns. Patient thankful for the call

## 2017-08-27 NOTE — Telephone Encounter (Signed)
Pt c/o medication issue:  1. Name of Medication:  irbesartan (AVAPRO) 300 MG tablet  2. How are you currently taking this medication (dosage and times per day)? Take 1 tablet (300 mg total) by mouth daily. 3. Are you having a reaction (difficulty breathing--STAT)? no  4. What is your medication issue? Pt insurance [employer] told her there is a recall on the medication, she tool the medication last 08/26/2017

## 2017-09-10 ENCOUNTER — Telehealth: Payer: Self-pay | Admitting: *Deleted

## 2017-09-10 MED ORDER — OLMESARTAN MEDOXOMIL 40 MG PO TABS: 40.0000 mg | ORAL_TABLET | Freq: Every day | ORAL | 2 refills | Status: DC

## 2017-09-10 NOTE — Telephone Encounter (Signed)
Change to Benicar 40mg  daily and have her check BP daily for a week and  Call with results

## 2017-09-10 NOTE — Telephone Encounter (Signed)
Patient calling in reference to her Irbesartan. She was told by her pharmacy to call Dr. Radford Pax and see what she is to take since there is a recall on her medication.   Patient states pharmacy has tried to call us numerous times about the medication switch. Please Advise. Routing message to Nurse, Provider, and Pharm D

## 2017-09-10 NOTE — Telephone Encounter (Signed)
I spoke with pt and gave her instructions from Dr. Radford Pax.  Will send prescription to [facility] on [address].  Pt is home bound and does not have a way to check her blood pressure.  She is seeing primary care in 2 weeks and Dr. Radford Pax in April.

## 2017-09-15 MED ORDER — LISINOPRIL 40 MG PO TABS: 40.0000 mg | ORAL_TABLET | Freq: Every day | ORAL | 3 refills | Status: DC

## 2017-09-15 NOTE — Addendum Note (Signed)
Addended by: Teressa Senter on: 09/15/2017 03:18 PM   Modules accepted: Orders

## 2017-09-15 NOTE — Telephone Encounter (Signed)
Received message from pharmacy that olmesartan on backorder and unable to obtain. I do not see that patient has been prescribed ACEi, with recalls and backorders would recommend she try ACEi unless intolerant. Recommend change to lisinopril 40mg  daily. Send to Dr. Radford Pax for ok to change to lisinopril 40mg  daily.

## 2017-09-15 NOTE — Telephone Encounter (Signed)
I am fine with changing to Lisinopril

## 2017-09-15 NOTE — Telephone Encounter (Signed)
I spoke with patient and instructed patient to START lisinopril 40 mg (1 tablet) once a day. I encouraged patient to monitor blood pressure daily, she states she does not have a blood pressure cuff at home but she has an appt with her primary MD on Monday. Patient states she will have blood pressure checked at appt. Patient in agreement with plan and thankful for the call.

## 2017-09-27 ENCOUNTER — Telehealth: Payer: Self-pay | Admitting: Cardiology

## 2017-09-27 NOTE — Telephone Encounter (Signed)
Pt c/o medication issue:  1. Name of Medication: Lisinopril   2. How are you currently taking this medication (dosage and times per day)?40 mg once a day   3. Are you having a reaction (difficulty breathing--STAT)? Yes   4. What is your medication issue?Making her Dizzy

## 2017-09-27 NOTE — Telephone Encounter (Signed)
Returned call to patient. No answer and no VM available.

## 2017-09-28 NOTE — Telephone Encounter (Signed)
I spoke with patient. Patient is c/o dizziness since she started taking lisinopril. Patient was switched from olmesartan to lisinopril on 09/15/17 due to the recall. Patient states BP on Sunday was 126/60s, she was not sure of the exact number. Pt did not take lisinopril this morning, pt did not experience any dizziness today. I advised patient to monitor BP daily and I will forward to Dr. Radford Pax for further recommendations. Patient verbalized understanding and thankful for the call.

## 2017-09-28 NOTE — Telephone Encounter (Signed)
Continue lisinopril and have her check her BP when she gets a dizzy spell and call to let me know what BP is

## 2017-09-28 NOTE — Telephone Encounter (Signed)
Follow up: ° ° °Patient returning call  ° ° ° °

## 2017-09-30 NOTE — Telephone Encounter (Signed)
I returned call to patient. I instructed patient to continue taking Lisinopril and record blood pressure when she feels dizzy. I informed her to call office with blood pressure readings. She verbalized understanding and thankful for the call.

## 2017-10-22 ENCOUNTER — Ambulatory Visit: Payer: Medicare HMO | Admitting: Cardiology

## 2017-10-26 IMAGING — DX DG CHEST 2V
2 series · 2 of 2 positions shown · non-contrast
Comparison: Radiographs September 17, 2015.

CLINICAL DATA: Shortness of breath.

EXAM:
CHEST  2 VIEW

[chest pa]
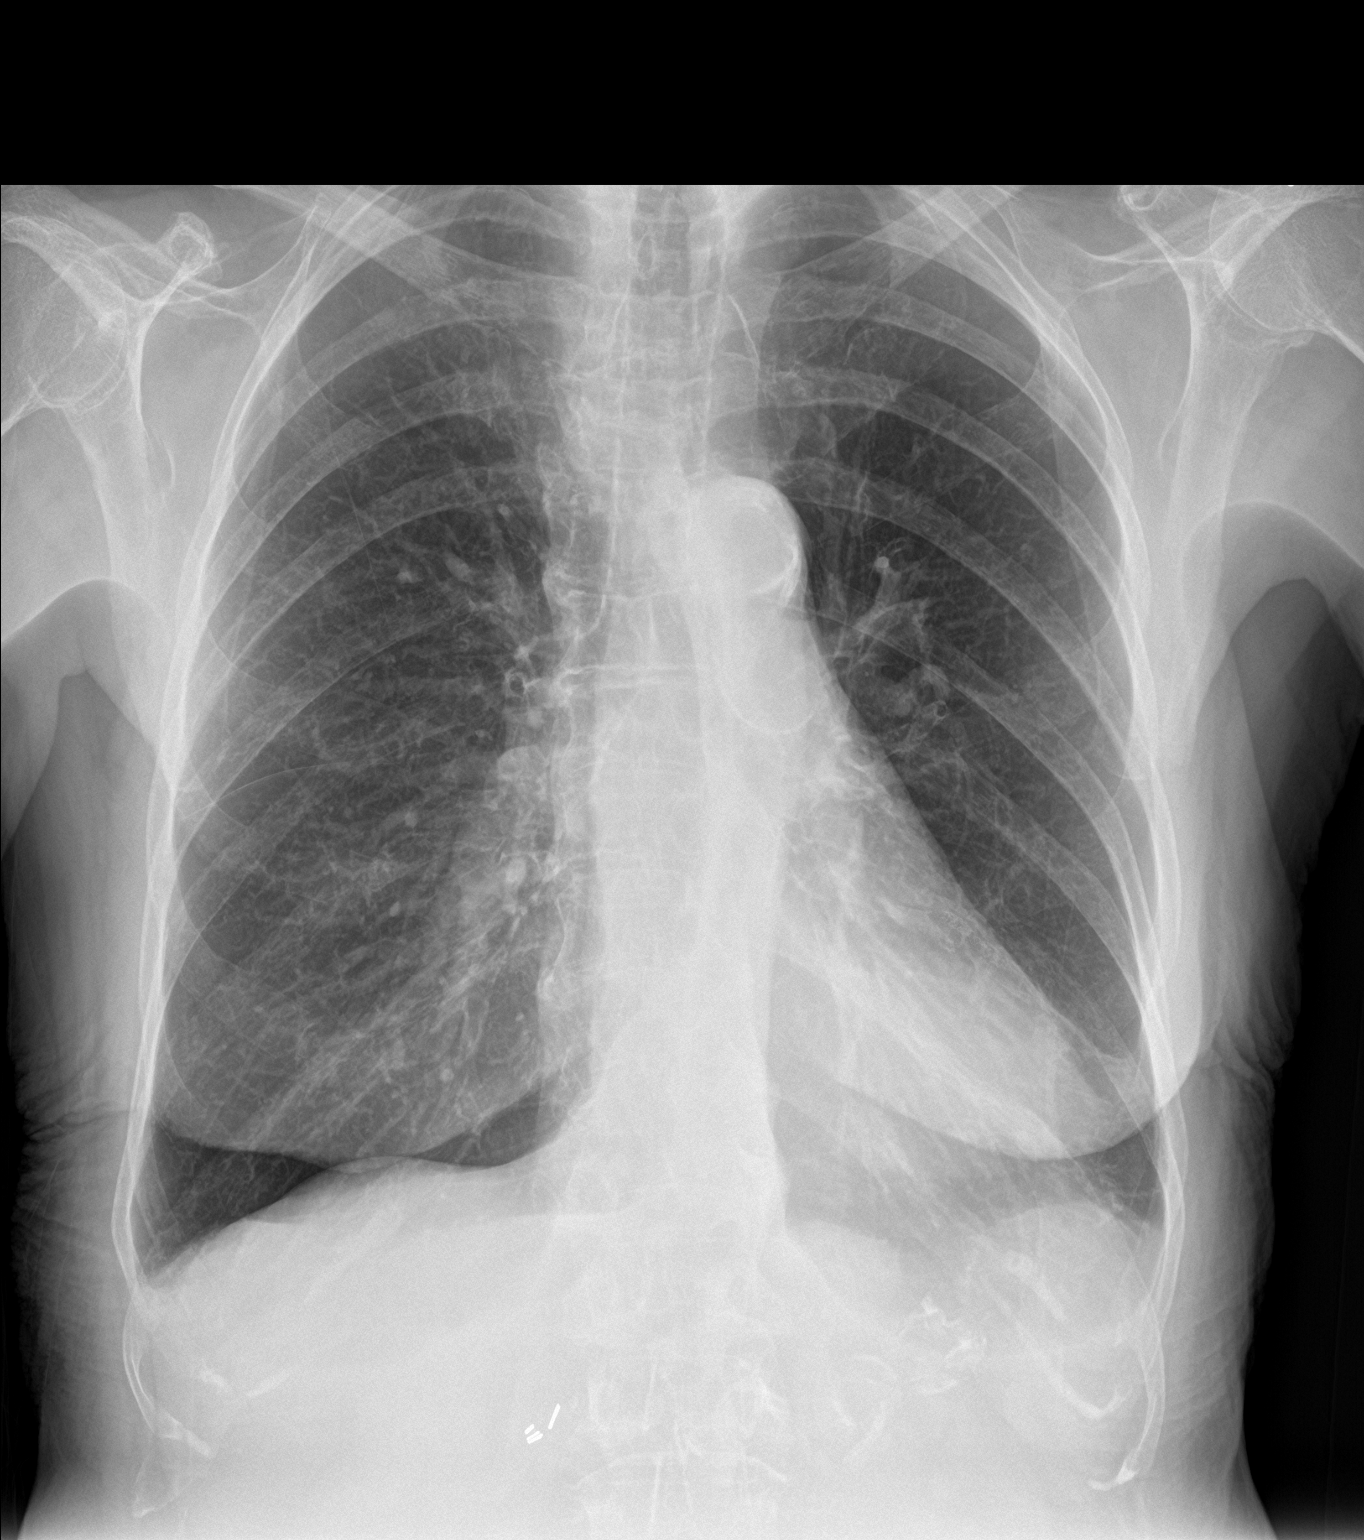

[chest lat]
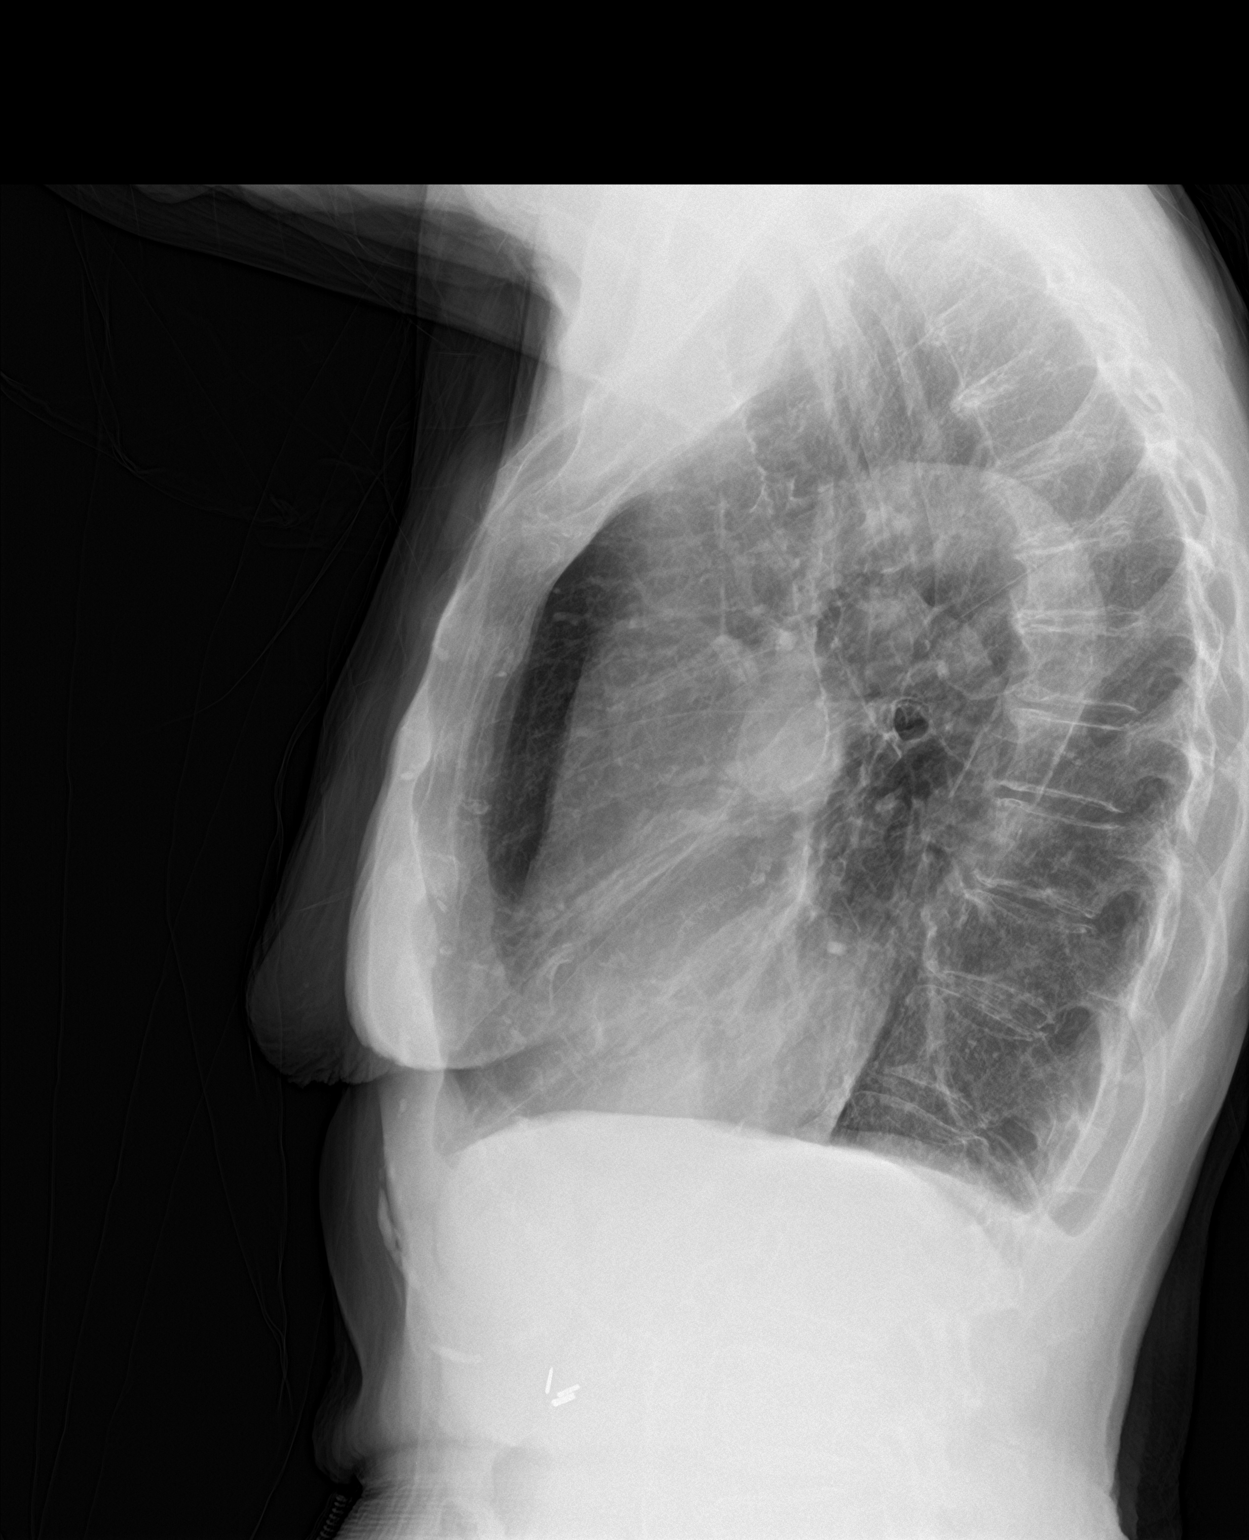

[2 of 2 positions shown; findings below may reference images not displayed]

FINDINGS: The heart size and mediastinal contours are within normal limits.
Atherosclerosis of thoracic aorta is noted. No pneumothorax or
pleural effusion is noted. Both lungs are clear. The visualized
skeletal structures are unremarkable.
IMPRESSION: No active cardiopulmonary disease.  Aortic atherosclerosis.

## 2017-12-23 ENCOUNTER — Ambulatory Visit: Payer: Medicare HMO | Admitting: Cardiology

## 2018-02-06 ENCOUNTER — Other Ambulatory Visit: Payer: Self-pay | Admitting: Cardiology

## 2018-03-09 ENCOUNTER — Other Ambulatory Visit: Payer: Self-pay | Admitting: Cardiology

## 2018-03-31 ENCOUNTER — Other Ambulatory Visit: Payer: Self-pay

## 2018-03-31 ENCOUNTER — Emergency Department (HOSPITAL_COMMUNITY)
Admission: EM | Admit: 2018-03-31 | Discharge: 2018-03-31 | Disposition: A | Discharge status: Home / Self Care | Payer: Medicare HMO | Attending: Emergency Medicine | Admitting: Emergency Medicine

## 2018-03-31 ENCOUNTER — Emergency Department (HOSPITAL_COMMUNITY): Payer: Medicare HMO

## 2018-03-31 ENCOUNTER — Encounter (HOSPITAL_COMMUNITY): Payer: Self-pay

## 2018-03-31 DIAGNOSIS — Z7901 Long term (current) use of anticoagulants: Secondary | ICD-10-CM | POA: Insufficient documentation

## 2018-03-31 DIAGNOSIS — Z87891 Personal history of nicotine dependence: Secondary | ICD-10-CM | POA: Diagnosis not present

## 2018-03-31 DIAGNOSIS — R531 Weakness: Secondary | ICD-10-CM

## 2018-03-31 DIAGNOSIS — J449 Chronic obstructive pulmonary disease, unspecified: Secondary | ICD-10-CM | POA: Insufficient documentation

## 2018-03-31 DIAGNOSIS — Z955 Presence of coronary angioplasty implant and graft: Secondary | ICD-10-CM | POA: Insufficient documentation

## 2018-03-31 DIAGNOSIS — I2510 Atherosclerotic heart disease of native coronary artery without angina pectoris: Secondary | ICD-10-CM | POA: Insufficient documentation

## 2018-03-31 DIAGNOSIS — I10 Essential (primary) hypertension: Secondary | ICD-10-CM | POA: Insufficient documentation

## 2018-03-31 DIAGNOSIS — R627 Adult failure to thrive: Secondary | ICD-10-CM | POA: Insufficient documentation

## 2018-03-31 DIAGNOSIS — R6251 Failure to thrive (child): Secondary | ICD-10-CM

## 2018-03-31 DIAGNOSIS — Z79899 Other long term (current) drug therapy: Secondary | ICD-10-CM | POA: Insufficient documentation

## 2018-03-31 DIAGNOSIS — E039 Hypothyroidism, unspecified: Secondary | ICD-10-CM | POA: Insufficient documentation

## 2018-03-31 LAB — BASIC METABOLIC PANEL
ANION GAP: 10 (ref 5–15)
BUN: 13 mg/dL (ref 8–23)
CALCIUM: 9.2 mg/dL (ref 8.9–10.3)
CO2: 25 mmol/L (ref 22–32)
CREATININE: 0.85 mg/dL (ref 0.44–1.00)
Chloride: 100 mmol/L (ref 98–111)
GFR, EST NON AFRICAN AMERICAN: 58 mL/min — AB (ref >60)
Glucose, Bld: 127 mg/dL — ABNORMAL HIGH (ref 70–99)
Potassium: 4.3 mmol/L (ref 3.5–5.1)
SODIUM: 135 mmol/L (ref 135–145)

## 2018-03-31 LAB — CBC
HCT: 36.8 % (ref 36.0–46.0)
HEMOGLOBIN: 12.3 g/dL (ref 12.0–15.0)
MCH: 31.6 pg (ref 26.0–34.0)
MCHC: 33.4 g/dL (ref 30.0–36.0)
MCV: 94.6 fL (ref 78.0–100.0)
PLATELETS: 137 10*3/uL — AB (ref 150–400)
RBC: 3.89 MIL/uL (ref 3.87–5.11)
RDW: 13.9 % (ref 11.5–15.5)
WBC: 6.5 10*3/uL (ref 4.0–10.5)

## 2018-03-31 LAB — I-STAT TROPONIN, ED: Troponin i, poc: 0 ng/mL (ref 0.00–0.08)

## 2018-03-31 NOTE — Discharge Instructions (Addendum)
Drink plenty of fluids and eat frequent small meals.  Follow up with primary care doctor in 2 days.  Return to the ED for any worsening or other concerns.

## 2018-03-31 NOTE — ED Provider Notes (Signed)
[facility] EMERGENCY DEPARTMENT Provider Note   CSN: [id number] Arrival date & time: 03/31/18  1157     History   Chief Complaint Chief Complaint  Patient presents with  . Weakness    HPI [name] is a 84 y.o. female.  HPI   Patient is a 84 year old female with PMHx of Paroxysmal Afib (on Coumadin), COPD, CAD s/p stent, and HTN who presents with gradually worsening generalized weakness over the couple weeks. She has no appetite.  No pain.  No falls.  No fevers, URI sxs, N/V/D, abdominal pain, or numbness.  Lives alone with frequent visits with family.  Former Therapist, sports.   Past Medical History:  Diagnosis Date  . Anxiety   . Arthritis   . CAD (coronary artery disease) 2007   a. 1997 s/p PCI of diagonal.  . Constipation   . COPD/Asthma   . Diverticulosis   . Esophageal reflux   . Family history of adverse reaction to anesthesia    daughter had trouble breathing after surgery, had to be reintubated  . Glaucoma   . HTN (hypertension), benign   . Hyperlipidemia   . Malignant melanoma ([place])    a. 09/2012 s/p resection.  . Neuropathy    lower legs  . PAF (paroxysmal atrial fibrillation) (HCC)    a. intolerant to beta blockers due to asthma and intolerant to CCB due to rash;  b. CHA2DS2VASc = 6-->chronic coumadin;  c. 03/2014 Echo: EF 60-65%, Gr 1 DD, triv AI, mildly dil RV, PASP 74mmhg.  [name] TIA (transient ischemic attack)    a. 02/2011 - chronic coumadin in setting of PAF.  [name] Urinary, incontinence, stress female     Patient Active Problem List   Diagnosis Date Noted  . On continuous oral anticoagulation   . Hip fracture ([place]) 04/19/2017  . Closed left hip fracture, initial encounter ([place]) 04/18/2017  . Ulcer of toe of left foot, with necrosis of bone ([place]) 10/08/2016  . Non-pressure chronic ulcer of other part of left foot limited to breakdown of skin ([place]) 09/24/2016  . Acute asthma exacerbation 09/21/2016  . Allergic rhinitis 09/21/2016  .  Anxiety 09/21/2016  . Arthritis 09/21/2016  . Breath shortness 09/21/2016  . Glaucoma 09/21/2016  . Adult hypothyroidism 09/21/2016  . Unilateral primary osteoarthritis, right knee 08/27/2016  . Bunion of great toe of right foot 06/12/2016  . Acquired hammer toe of left foot   . Irregular bowel habits 10/25/2014  . Cerumen impaction 09/26/2014  . Essential hypertension 03/22/2014  . Wrist laceration 03/01/2014  . Primary open angle glaucoma of left eye, moderate stage 11/16/2013  . Primary open angle glaucoma of right eye, mild stage 11/16/2013  . Persistent atrial fibrillation ([place])   . Hyperlipidemia LDL goal <70   . Long term current use of anticoagulant therapy 09/24/2012  . CAD (coronary artery disease) 09/24/2012  . Transient cerebral ischemia 03/05/2011  . WEIGHT LOSS 08/23/2009  . CHANGE IN BOWELS 08/23/2009  . COLONIC POLYPS, ADENOMATOUS, HX OF 08/23/2009  . Anxiety state 07/02/2009  . Allergic rhinitis due to pollen 11/13/2008  . Major depressive disorder, single episode, moderate ([place]) 05/14/2008  . Sinus infection 07/13/2007  . Airway hyperreactivity 06/15/2007    Past Surgical History:  Procedure Laterality Date  . ABDOMINAL HYSTERECTOMY    . APPENDECTOMY    . BACK SURGERY     lumbar laminectomy  . CHOLECYSTECTOMY    . COLONOSCOPY    . CORONARY ANGIOPLASTY WITH STENT PLACEMENT    .  EYE SURGERY    . NASAL SINUS SURGERY    . VESICOVAGINAL FISTULA CLOSURE W/ TAH    . WEIL OSTEOTOMY Left 06/12/2016   Procedure: Left [place] and [place] Left [name], Weil Osteotomy 2nd and 3rd Metatarsal;  Surgeon: Newt Minion, MD;  Location: [facility];  Service: Orthopedics;  Laterality: Left;     OB History   None      Home Medications    Prior to Admission medications   Medication Sig Start Date End Date Taking? Authorizing Provider  acetaminophen (TYLENOL) 325 MG tablet Take 325 mg by mouth every 6 (six) hours as needed for moderate pain.    Yes [provider]   albuterol (PROAIR HFA) 108 (90 Base) MCG/ACT inhaler Inhale 1-2 puffs into the lungs every 4 (four) hours as needed for wheezing or shortness of breath. 12/11/15  Yes Juanito Doom, MD  ALPRAZolam Duanne Moron) 0.25 MG tablet Take 1 tablet (0.25 mg total) by mouth at bedtime as needed for anxiety. Patient taking differently: Take 0.25 mg by mouth 2 (two) times daily as needed for anxiety.  06/15/16  Yes Newt Minion, MD  amLODipine (NORVASC) 10 MG tablet TAKE 1 TABLET EVERY DAY 03/10/18  Yes Turner, Eber Hong, MD  cetirizine (ZYRTEC) 10 MG tablet Take 10 mg by mouth daily as needed for allergies.   Yes [provider]  Cholecalciferol (VITAMIN D) 2000 units tablet Take 2,000 Units by mouth daily.   Yes [provider]  fluticasone (FLONASE) 50 MCG/ACT nasal spray USE 1 SPRAY INTO BOTH NOSTRILS DAILY AS NEEDED FOR ALLERGIES OR RHINITIS Patient taking differently: Place 1 spray into both nostrils daily.  06/18/17  Yes McQuaid, Ronie Spies, MD  fluticasone furoate-vilanterol (BREO ELLIPTA) 200-25 MCG/INH AEPB Inhale 1 puff into the lungs daily. 01/27/17  Yes Juanito Doom, MD  furosemide (LASIX) 40 MG tablet Take 40 mg by mouth daily as needed for fluid.    Yes [provider]  latanoprost (XALATAN) 0.005 % ophthalmic solution Place 1 drop into both eyes at bedtime.   Yes [provider]  lisinopril (PRINIVIL,ZESTRIL) 40 MG tablet Take 40 mg by mouth daily. 03/16/18  Yes [provider]  Multiple Vitamins-Minerals (MULTIVITAMIN & MINERAL PO) Take 1 tablet by mouth daily.   Yes [provider]  pilocarpine (PILOCAR) 1 % ophthalmic solution Place 1 drop into both eyes 3 (three) times daily. 12/30/17  Yes [provider]  senna-docusate (SENOKOT-S) 8.6-50 MG tablet Take 2 tablets by mouth at bedtime as needed for mild constipation. 04/22/17  Yes Alma Friendly, MD  traZODone (DESYREL) 100 MG tablet Take 100 mg by mouth at bedtime. 03/18/18   Yes [provider]  warfarin (COUMADIN) 2.5 MG tablet Take 1-1.5 tablets (2.5-3.75 mg total) by mouth daily. Take 7.5mg  (3 tablets) for the next 3 days (today, Saturday and Sunday). On Monday check INR and resume your normal dosing as noted below.  Take 3.75 mg (1.5 tablets) on Sun, Tues, Weds, Thurs, Sat & Take 2.5 mg (1 tablet) on Mon & Fri Patient taking differently: Take 5 mg by mouth daily at 6 PM. Checked monthly,  Dose may change accordingly to I & R 04/23/17  Yes Alma Friendly, MD  HYDROcodone-acetaminophen (NORCO) 5-325 MG tablet Take 1 tablet by mouth every 6 (six) hours as needed for severe pain. Patient not taking: Reported on 03/31/2018 04/22/17   Alma Friendly, MD  lisinopril (PRINIVIL,ZESTRIL) 40 MG tablet Take 1 tablet (  40 mg total) by mouth daily. 09/15/17 12/14/17  Sueanne Margarita, MD    Family History Family History  Problem Relation Age of Onset  . Heart disease Father   . Hemachromatosis Father   . Heart attack Father   . Colon cancer Mother   . Peripheral vascular disease Mother   . Peripheral vascular disease Sister   . Cancer Sister   . Lymphoma Sister   . Heart attack Brother   . Heart disease Brother     Social History Social History   Tobacco Use  . Smoking status: Former Smoker    Packs/day: 0.20    Years: 25.00    Pack years: 5.00    Types: Cigarettes    Last attempt to quit: 07/06/1966    Years since quitting: 51.7  . Smokeless tobacco: Never Used  Substance Use Topics  . Alcohol use: No    Alcohol/week: 0.0 standard drinks  . Drug use: No     Allergies   Alphagan [brimonidine]; Apraclonidine; Biaxin [clarithromycin]; Brovana [arformoterol]; Budesonide; Cefdinir; Cephalexin; Cephalexin; Cifenline; Clonidine; Clonidine derivatives; Dorzolamide; Iohexol; Nsaids; Penicillins; Sulfamethoxazole; Sulfonamide derivatives; Tetracycline; Timolol; Vancomycin; Zetia [ezetimibe]; and Cardizem [diltiazem hcl]   Review of  Systems Review of Systems  Constitutional: Positive for activity change, appetite change and fatigue. Negative for chills and fever.  HENT: Negative for congestion and sore throat.   Eyes: Negative for pain and visual disturbance.  Respiratory: Negative for cough and shortness of breath.   Cardiovascular: Negative for chest pain and palpitations.  Gastrointestinal: Negative for abdominal pain, diarrhea and vomiting.  Genitourinary: Negative for dysuria and hematuria.  Musculoskeletal: Negative for arthralgias and back pain.  Skin: Negative for color change and rash.  Neurological: Positive for weakness (generalized). Negative for seizures and syncope.  All other systems reviewed and are negative.    Physical Exam Updated Vital Signs BP 118/67   Pulse 74   Temp 97.7 F (36.5 C) (Oral)   Resp (!) 22   Ht 5\' 6"  (1.676 m)   Wt 60.3 kg   SpO2 99%   BMI 21.47 kg/m   Physical Exam  Constitutional: She is oriented to person, place, and time. No distress.  Frail appearing elderly female.  HENT:  Head: Normocephalic and atraumatic.  Mouth/Throat: Oropharynx is clear and moist.  Eyes: Pupils are equal, round, and reactive to light. Conjunctivae and EOM are normal.  Neck: Normal range of motion. Neck supple.  Cardiovascular: Normal rate, regular rhythm and intact distal pulses.  Murmur (systolic) heard. Pulmonary/Chest: Effort normal and breath sounds normal. No respiratory distress. She has no wheezes. She has no rales.  Abdominal: Soft. She exhibits no distension. There is no tenderness. There is no guarding.  Musculoskeletal: Normal range of motion. She exhibits no edema.  Neurological: She is alert and oriented to person, place, and time. She has normal strength. No cranial nerve deficit or sensory deficit. Coordination and gait (with assistance) normal. GCS eye subscore is 4. GCS verbal subscore is 5. GCS motor subscore is 6.  Skin: Skin is warm and dry. Capillary refill takes  less than 2 seconds. No rash noted.  Psychiatric: She has a normal mood and affect.  Nursing note and vitals reviewed.    ED Treatments / Results  Labs (all labs ordered are listed, but only abnormal results are displayed) Labs Reviewed  BASIC METABOLIC PANEL - Abnormal; Notable for the following components:      Result Value   Glucose, Bld 127 (*)  GFR calc non Af Amer 58 (*)    All other components within normal limits  CBC - Abnormal; Notable for the following components:   Platelets 137 (*)    All other components within normal limits  I-STAT TROPONIN, ED    EKG EKG Interpretation  Date/Time:  Thursday March 31 2018 12:10:14 EDT Ventricular Rate:  73 PR Interval:  192 QRS Duration: 86 QT Interval:  384 QTC Calculation: 423 R Axis:   71 Text Interpretation:  Sinus rhythm with Premature supraventricular complexes Otherwise normal ECG rate slower, otherwise similar to previous Confirmed by Theotis Burrow [phone]) on 03/31/2018 2:40:24 PM   Radiology Dg Chest 2 View  Result Date: 03/31/2018 CLINICAL DATA:  Weakness. EXAM: CHEST - 2 VIEW COMPARISON:  Radiograph of April 18, 2017. FINDINGS: The heart size and mediastinal contours are within normal limits. Both lungs are clear. There is no evidence of bowel obstruction or ileus. The visualized skeletal structures are unremarkable. IMPRESSION: No active cardiopulmonary disease. Electronically Signed   By: Marijo Conception, M.D.   On: 03/31/2018 12:52    Procedures Procedures (including critical care time)  Medications Ordered in ED Medications - No data to display   Initial Impression / Assessment and Plan / ED Course  I have reviewed the triage vital signs and the nursing notes.  Pertinent labs & imaging results that were available during my care of the patient were reviewed by me and considered in my medical decision making (see chart for details).    Patient is a 84 year old female with PMHx of Paroxysmal Afib  (on Coumadin), COPD, CAD s/p stent, and HTN who presents with gradually worsening generalized weakness over the couple weeks. On arrival HDS.  Afebrile. Exam as above grossly unremarkable.   Work-up unrevealing for acute infectious or metabolic process. Family unwilling to wait to provide urine sample.  Doubt acute intracranial process as patient is ambulatory without focal neurologic deficits.  Doubt acute abdominal process as abdomen soft and benign.  Etiology likely 2/2 failure to thrive.  Instructed patient to eat small frequent meals and supplemental with protein drinks (ie Ensure).  HDS at discharge with granddaughter.   Old records reviewed.  Imaging and labs reviewed and interpreted by myself and attending and used in the MDM.  Addressed patient question and concerns.  Reviewed discharged instructions with strict precautions given.  Advised patient to schedule follow-up with primary care provider.  Patient verbalized understanding and agrees with plan.  Patient stable at discharge.  The plan for this patient was discussed with Dr. Vanita Panda who voiced agreement and who oversaw evaluation and treatment of this patient.   Final Clinical Impressions(s) / ED Diagnoses   Final diagnoses:  Generalized weakness  Failure to thrive (0-17)    ED Discharge Orders    None       Fabian November, MD 04/01/18 1010    Carmin Muskrat, MD 04/01/18 2337

## 2018-03-31 NOTE — ED Notes (Signed)
Patient states she just does not have appetite, Patient states, I don't eat because  I am not hungry. States ate breakfast this morning with no problem. States she she feel more tired than weak

## 2018-03-31 NOTE — ED Notes (Signed)
Discharge instructions not discussed with Pt because pt's daughter refused to wait.  Pt stable and left via wc.

## 2018-03-31 NOTE — ED Triage Notes (Signed)
Pt arrived from home with family with concern of increasing weakness over the past couple of weeks with decreased appetite.

## 2018-04-03 ENCOUNTER — Other Ambulatory Visit: Payer: Self-pay | Admitting: Cardiology

## 2018-04-05 ENCOUNTER — Emergency Department (HOSPITAL_COMMUNITY): Payer: Medicare HMO

## 2018-04-05 ENCOUNTER — Emergency Department (HOSPITAL_COMMUNITY)
Admission: EM | Admit: 2018-04-05 | Discharge: 2018-04-05 | Disposition: A | Discharge status: Home / Self Care | Payer: Medicare HMO | Attending: Emergency Medicine | Admitting: Emergency Medicine

## 2018-04-05 DIAGNOSIS — F419 Anxiety disorder, unspecified: Secondary | ICD-10-CM | POA: Diagnosis not present

## 2018-04-05 DIAGNOSIS — Z8673 Personal history of transient ischemic attack (TIA), and cerebral infarction without residual deficits: Secondary | ICD-10-CM | POA: Insufficient documentation

## 2018-04-05 DIAGNOSIS — M25511 Pain in right shoulder: Secondary | ICD-10-CM | POA: Diagnosis not present

## 2018-04-05 DIAGNOSIS — I10 Essential (primary) hypertension: Secondary | ICD-10-CM | POA: Diagnosis not present

## 2018-04-05 DIAGNOSIS — N1339 Other hydronephrosis: Secondary | ICD-10-CM

## 2018-04-05 DIAGNOSIS — I2510 Atherosclerotic heart disease of native coronary artery without angina pectoris: Secondary | ICD-10-CM | POA: Insufficient documentation

## 2018-04-05 DIAGNOSIS — R109 Unspecified abdominal pain: Secondary | ICD-10-CM | POA: Insufficient documentation

## 2018-04-05 DIAGNOSIS — R630 Anorexia: Secondary | ICD-10-CM | POA: Insufficient documentation

## 2018-04-05 DIAGNOSIS — J449 Chronic obstructive pulmonary disease, unspecified: Secondary | ICD-10-CM | POA: Insufficient documentation

## 2018-04-05 DIAGNOSIS — Z87891 Personal history of nicotine dependence: Secondary | ICD-10-CM | POA: Insufficient documentation

## 2018-04-05 DIAGNOSIS — Z7901 Long term (current) use of anticoagulants: Secondary | ICD-10-CM | POA: Insufficient documentation

## 2018-04-05 DIAGNOSIS — R079 Chest pain, unspecified: Secondary | ICD-10-CM | POA: Diagnosis present

## 2018-04-05 LAB — PROTIME-INR
INR: 1.68
Prothrombin Time: 19.7 seconds — ABNORMAL HIGH (ref 11.4–15.2)

## 2018-04-05 LAB — URINALYSIS, ROUTINE W REFLEX MICROSCOPIC
Bilirubin Urine: NEGATIVE
GLUCOSE, UA: NEGATIVE mg/dL
HGB URINE DIPSTICK: NEGATIVE
KETONES UR: NEGATIVE mg/dL
Leukocytes, UA: NEGATIVE
Nitrite: NEGATIVE
PH: 6 (ref 5.0–8.0)
PROTEIN: NEGATIVE mg/dL
Specific Gravity, Urine: 1.009 (ref 1.005–1.030)

## 2018-04-05 LAB — COMPREHENSIVE METABOLIC PANEL
ALBUMIN: 3.9 g/dL (ref 3.5–5.0)
ALK PHOS: 38 U/L (ref 38–126)
ALT: 14 U/L (ref 0–44)
AST: 22 U/L (ref 15–41)
Anion gap: 11 (ref 5–15)
BUN: 22 mg/dL (ref 8–23)
CALCIUM: 9.4 mg/dL (ref 8.9–10.3)
CO2: 28 mmol/L (ref 22–32)
CREATININE: 0.77 mg/dL (ref 0.44–1.00)
Chloride: 101 mmol/L (ref 98–111)
GFR calc Af Amer: >60 mL/min (ref >60)
GFR calc non Af Amer: >60 mL/min (ref >60)
GLUCOSE: 107 mg/dL — AB (ref 70–99)
Potassium: 4.6 mmol/L (ref 3.5–5.1)
Sodium: 140 mmol/L (ref 135–145)
Total Bilirubin: 0.5 mg/dL (ref 0.3–1.2)
Total Protein: 6.9 g/dL (ref 6.5–8.1)

## 2018-04-05 LAB — CBC WITH DIFFERENTIAL/PLATELET
BASOS ABS: 0 10*3/uL (ref 0.0–0.1)
BASOS PCT: 0 %
Eosinophils Absolute: 0.3 10*3/uL (ref 0.0–0.7)
Eosinophils Relative: 5 %
HEMATOCRIT: 36.6 % (ref 36.0–46.0)
HEMOGLOBIN: 12.4 g/dL (ref 12.0–15.0)
Lymphocytes Relative: 26 %
Lymphs Abs: 1.6 10*3/uL (ref 0.7–4.0)
MCH: 31.2 pg (ref 26.0–34.0)
MCHC: 33.9 g/dL (ref 30.0–36.0)
MCV: 92.2 fL (ref 78.0–100.0)
Monocytes Absolute: 0.5 10*3/uL (ref 0.1–1.0)
Monocytes Relative: 7 %
NEUTROS ABS: 3.9 10*3/uL (ref 1.7–7.7)
NEUTROS PCT: 62 %
Platelets: 146 10*3/uL — ABNORMAL LOW (ref 150–400)
RBC: 3.97 MIL/uL (ref 3.87–5.11)
RDW: 14.4 % (ref 11.5–15.5)
WBC: 6.3 10*3/uL (ref 4.0–10.5)

## 2018-04-05 LAB — I-STAT TROPONIN, ED: TROPONIN I, POC: 0 ng/mL (ref 0.00–0.08)

## 2018-04-05 LAB — LIPASE, BLOOD: LIPASE: 64 U/L — AB (ref 11–51)

## 2018-04-05 LAB — RAPID URINE DRUG SCREEN, HOSP PERFORMED
AMPHETAMINES: NOT DETECTED
BENZODIAZEPINES: POSITIVE — AB
Barbiturates: NOT DETECTED
Cocaine: NOT DETECTED
Opiates: NOT DETECTED
TETRAHYDROCANNABINOL: NOT DETECTED

## 2018-04-05 LAB — ETHANOL: Alcohol, Ethyl (B): <10 mg/dL (ref <10)

## 2018-04-05 MED ORDER — SODIUM CHLORIDE 0.9 % IV BOLUS
500.0000 mL | Freq: Once | INTRAVENOUS | Status: AC
  Administered 2018-04-05: 500 mL via INTRAVENOUS

## 2018-04-05 NOTE — ED Provider Notes (Signed)
[facility] DEPT Provider Note   CSN: [id number] Arrival date & time: 04/05/18  1245     History   Chief Complaint Chief Complaint  Patient presents with  . Shoulder Pain    rt  . Depression    HPI [name] is a 84 y.o. female hx of CAD, COPD, HTN, afib on coumadin, presenting with weight loss, poor appetite, failure to thrive, depression.  Patient lives at home by herself.  She states that she had poor appetite for the last several weeks.  She has some epigastric pain as well.  She came to the ED about a week ago for similar symptoms and labs and chest x-ray were unremarkable.  She states that she has been home for the last week and continues to have poor appetite.  Denies any vomiting or diarrhea.  She states that she lost about 5 pounds over the last week and was concerned that she may have a malignancy going on.  She denies any fevers or chills or cough.  She complains of some right-sided chest pain as well as right shoulder pain but denies any fall or injuries.  Patient apparently told her daughter that " I am going to die" and daughter called EMS and she was brought here for evaluation.  Patient did take some Xanax prior to arrival.  Patient states that she wants to just die at home and she does not want to be placed anywhere.  She denies depression and denies any suicidal plan to kill herself.   I called the daughter, [name].  She states that his mother has been living by herself for years and she has alienated all family members.  She is adamant that she wants to be by herself and wants to die at home.   The history is provided by the patient and a relative.    Past Medical History:  Diagnosis Date  . Anxiety   . Arthritis   . CAD (coronary artery disease) 2007   a. 1997 s/p PCI of diagonal.  . Constipation   . COPD/Asthma   . Diverticulosis   . Esophageal reflux   . Family history of adverse reaction to anesthesia    daughter had  trouble breathing after surgery, had to be reintubated  . Glaucoma   . HTN (hypertension), benign   . Hyperlipidemia   . Malignant melanoma ([place])    a. 09/2012 s/p resection.  . Neuropathy    lower legs  . PAF (paroxysmal atrial fibrillation) (HCC)    a. intolerant to beta blockers due to asthma and intolerant to CCB due to rash;  b. CHA2DS2VASc = 6-->chronic coumadin;  c. 03/2014 Echo: EF 60-65%, Gr 1 DD, triv AI, mildly dil RV, PASP 63mmhg.  [name] TIA (transient ischemic attack)    a. 02/2011 - chronic coumadin in setting of PAF.  [name] Urinary, incontinence, stress female     Patient Active Problem List   Diagnosis Date Noted  . On continuous oral anticoagulation   . Hip fracture ([place]) 04/19/2017  . Closed left hip fracture, initial encounter ([place]) 04/18/2017  . Ulcer of toe of left foot, with necrosis of bone ([place]) 10/08/2016  . Non-pressure chronic ulcer of other part of left foot limited to breakdown of skin ([place]) 09/24/2016  . Acute asthma exacerbation 09/21/2016  . Allergic rhinitis 09/21/2016  . Anxiety 09/21/2016  . Arthritis 09/21/2016  . Breath shortness 09/21/2016  . Glaucoma 09/21/2016  . Adult hypothyroidism 09/21/2016  .  Unilateral primary osteoarthritis, right knee 08/27/2016  . Bunion of great toe of right foot 06/12/2016  . Acquired hammer toe of left foot   . Irregular bowel habits 10/25/2014  . Cerumen impaction 09/26/2014  . Essential hypertension 03/22/2014  . Wrist laceration 03/01/2014  . Primary open angle glaucoma of left eye, moderate stage 11/16/2013  . Primary open angle glaucoma of right eye, mild stage 11/16/2013  . Persistent atrial fibrillation ([place])   . Hyperlipidemia LDL goal <70   . Long term current use of anticoagulant therapy 09/24/2012  . CAD (coronary artery disease) 09/24/2012  . Transient cerebral ischemia 03/05/2011  . WEIGHT LOSS 08/23/2009  . CHANGE IN BOWELS 08/23/2009  . COLONIC POLYPS, ADENOMATOUS, HX OF 08/23/2009  . Anxiety state  07/02/2009  . Allergic rhinitis due to pollen 11/13/2008  . Major depressive disorder, single episode, moderate ([place]) 05/14/2008  . Sinus infection 07/13/2007  . Airway hyperreactivity 06/15/2007    Past Surgical History:  Procedure Laterality Date  . ABDOMINAL HYSTERECTOMY    . APPENDECTOMY    . BACK SURGERY     lumbar laminectomy  . CHOLECYSTECTOMY    . COLONOSCOPY    . CORONARY ANGIOPLASTY WITH STENT PLACEMENT    . EYE SURGERY    . NASAL SINUS SURGERY    . VESICOVAGINAL FISTULA CLOSURE W/ TAH    . WEIL OSTEOTOMY Left 06/12/2016   Procedure: Left Chevron and Aiken Left [name], Weil Osteotomy 2nd and 3rd Metatarsal;  Surgeon: Newt Minion, MD;  Location: [facility];  Service: Orthopedics;  Laterality: Left;     OB History   None      Home Medications    Prior to Admission medications   Medication Sig Start Date End Date Taking? Authorizing Provider  acetaminophen (TYLENOL) 325 MG tablet Take 325 mg by mouth every 6 (six) hours as needed for moderate pain.     [provider]  albuterol (PROAIR HFA) 108 (90 Base) MCG/ACT inhaler Inhale 1-2 puffs into the lungs every 4 (four) hours as needed for wheezing or shortness of breath. 12/11/15   Juanito Doom, MD  ALPRAZolam Duanne Moron) 0.25 MG tablet Take 1 tablet (0.25 mg total) by mouth at bedtime as needed for anxiety. Patient taking differently: Take 0.25 mg by mouth 2 (two) times daily as needed for anxiety.  06/15/16   Newt Minion, MD  amLODipine (NORVASC) 10 MG tablet Take 1 tablet (10 mg total) by mouth daily. Patient overdue for an appointment.Please call and schedule for further refills3rd/final attempt 04/04/18   Sueanne Margarita, MD  cetirizine (ZYRTEC) 10 MG tablet Take 10 mg by mouth daily as needed for allergies.    [provider]  Cholecalciferol (VITAMIN D) 2000 units tablet Take 2,000 Units by mouth daily.    [provider]  fluticasone (FLONASE) 50 MCG/ACT nasal spray USE 1 SPRAY INTO  BOTH NOSTRILS DAILY AS NEEDED FOR ALLERGIES OR RHINITIS Patient taking differently: Place 1 spray into both nostrils daily.  06/18/17   Juanito Doom, MD  fluticasone furoate-vilanterol (BREO ELLIPTA) 200-25 MCG/INH AEPB Inhale 1 puff into the lungs daily. 01/27/17   Juanito Doom, MD  furosemide (LASIX) 40 MG tablet Take 40 mg by mouth daily as needed for fluid.     [provider]  HYDROcodone-acetaminophen (NORCO) 5-325 MG tablet Take 1 tablet by mouth every 6 (six) hours as needed for severe pain. Patient not taking: Reported on 03/31/2018 04/22/17   Alma Friendly,  MD  latanoprost (XALATAN) 0.005 % ophthalmic solution Place 1 drop into both eyes at bedtime.    [provider]  lisinopril (PRINIVIL,ZESTRIL) 40 MG tablet Take 1 tablet (40 mg total) by mouth daily. 09/15/17 12/14/17  Sueanne Margarita, MD  lisinopril (PRINIVIL,ZESTRIL) 40 MG tablet Take 40 mg by mouth daily. 03/16/18   [provider]  Multiple Vitamins-Minerals (MULTIVITAMIN & MINERAL PO) Take 1 tablet by mouth daily.    [provider]  pilocarpine (PILOCAR) 1 % ophthalmic solution Place 1 drop into both eyes 3 (three) times daily. 12/30/17   [provider]  senna-docusate (SENOKOT-S) 8.6-50 MG tablet Take 2 tablets by mouth at bedtime as needed for mild constipation. 04/22/17   Alma Friendly, MD  traZODone (DESYREL) 100 MG tablet Take 100 mg by mouth at bedtime. 03/18/18   [provider]  warfarin (COUMADIN) 2.5 MG tablet Take 1-1.5 tablets (2.5-3.75 mg total) by mouth daily. Take 7.5mg  (3 tablets) for the next 3 days (today, Saturday and Sunday). On Monday check INR and resume your normal dosing as noted below.  Take 3.75 mg (1.5 tablets) on Sun, Tues, Weds, Thurs, Sat & Take 2.5 mg (1 tablet) on Mon & Fri Patient taking differently: Take 5 mg by mouth daily at 6 PM. Checked monthly,  Dose may change accordingly to I & R 04/23/17   Alma Friendly, MD     Family History Family History  Problem Relation Age of Onset  . Heart disease Father   . Hemachromatosis Father   . Heart attack Father   . Colon cancer Mother   . Peripheral vascular disease Mother   . Peripheral vascular disease Sister   . Cancer Sister   . Lymphoma Sister   . Heart attack Brother   . Heart disease Brother     Social History Social History   Tobacco Use  . Smoking status: Former Smoker    Packs/day: 0.20    Years: 25.00    Pack years: 5.00    Types: Cigarettes    Last attempt to quit: 07/06/1966    Years since quitting: 51.7  . Smokeless tobacco: Never Used  Substance Use Topics  . Alcohol use: No    Alcohol/week: 0.0 standard drinks  . Drug use: No     Allergies   Alphagan [brimonidine]; Apraclonidine; Biaxin [clarithromycin]; Brovana [arformoterol]; Budesonide; Cefdinir; Cephalexin; Cephalexin; Cifenline; Clonidine; Clonidine derivatives; Dorzolamide; Iohexol; Nsaids; Penicillins; Sulfamethoxazole; Sulfonamide derivatives; Tetracycline; Timolol; Vancomycin; Zetia [ezetimibe]; and Cardizem [diltiazem hcl]   Review of Systems Review of Systems  Gastrointestinal: Positive for abdominal pain and nausea.  Psychiatric/Behavioral: Positive for depression and dysphoric mood.  All other systems reviewed and are negative.    Physical Exam Updated Vital Signs BP (!) 121/49 (BP Location: Right Arm)   Pulse 60   Temp 97.8 F (36.6 C) (Oral)   Resp 16   SpO2 100%   Physical Exam  Constitutional: She is oriented to person, place, and time.  Chronically ill, slightly dehydrated   HENT:  Head: Normocephalic.  MM slightly dry   Eyes: Pupils are equal, round, and reactive to light. Conjunctivae and EOM are normal.  Neck: Normal range of motion. Neck supple.  Cardiovascular: Normal rate, regular rhythm and normal heart sounds.  Pulmonary/Chest: Effort normal and breath sounds normal. No stridor. No respiratory distress. She has no wheezes.   Abdominal: Soft. Bowel sounds are normal.  Mild epigastric tenderness, mild RUQ tenderness, neg murphy's. Mild R CVAT  Musculoskeletal: Normal range of motion.  Neurological: She is alert and oriented to person, place, and time. No cranial nerve deficit. Coordination normal.  CN 2- 12 intact, nl strength and sensation throughout   Skin: Skin is warm.  Psychiatric:  Slightly depressed, not suicidal   Nursing note and vitals reviewed.    ED Treatments / Results  Labs (all labs ordered are listed, but only abnormal results are displayed) Labs Reviewed  URINE CULTURE  CBC WITH DIFFERENTIAL/PLATELET  COMPREHENSIVE METABOLIC PANEL  URINALYSIS, ROUTINE W REFLEX MICROSCOPIC  PROTIME-INR  ETHANOL  RAPID URINE DRUG SCREEN, HOSP PERFORMED  LIPASE, BLOOD  I-STAT TROPONIN, ED    EKG None  Radiology No results found.  Procedures Procedures (including critical care time)  Medications Ordered in ED Medications  sodium chloride 0.9 % bolus 500 mL (has no administration in time range)     Initial Impression / Assessment and Plan / ED Course  I have reviewed the triage vital signs and the nursing notes.  Pertinent labs & imaging results that were available during my care of the patient were reviewed by me and considered in my medical decision making (see chart for details).     [name] is a 84 y.o. female here with poor appetite, depression, weight loss. I had extensive discussion with daughter, [name], who is the proxy, and patient. Patient doesn't want to come to the ED.   3:07 PM CT showed mild R hydro but no stone. UA showed no UTI so I don't think she has pyelo. She has some nonspecific enlarged lymph nodes in her abdomen with no mass. UDS + benzos but she is on xanax. Stable for discharge. I updated daughter regarding plan of care and she will pick patient up.    Final Clinical Impressions(s) / ED Diagnoses   Final diagnoses:  None    ED Discharge  Orders    None       Drenda Freeze, MD 04/05/18 [phone]

## 2018-04-05 NOTE — ED Notes (Signed)
Patients Daughter: Manus Gunning [phone] request call from MD after mother is seen, per EMS

## 2018-04-05 NOTE — ED Triage Notes (Signed)
Patient BIB [facility] from home for rt shoulder and back pain as well as depression. Pt seen 3 wks ago dx with failure to thrive. Patient answered the door and stated " I feel like I am going to die". Pt has had poor appetite and is frustrated she can no longer care for herself. Pt doesn't trust her PCP, doesn't feel they take her seriously. Pt took xanax for anxiety at 1100.

## 2018-04-05 NOTE — ED Notes (Signed)
Bed: WA17 Expected date:  Expected time:  Means of arrival:  Comments: EMS-FTT

## 2018-04-05 NOTE — Discharge Instructions (Addendum)
Take tylenol for pain. Stay hydrated. Continue drinking ensure.   See your doctor in a week.   Your right kidney is slightly swollen. Follow up with urologist   Return to ER if you have severe pain, vomiting, fever, plan to harm yourself or others. Consider following up with a counselor if you have worse depression.

## 2018-04-05 NOTE — ED Notes (Signed)
Patient transported to X-ray 

## 2018-04-05 NOTE — ED Notes (Signed)
ED Provider at bedside. 

## 2018-04-05 NOTE — ED Notes (Signed)
Writer spoke with pts daughter. She is going to get keys to pts house then on here way to pick pt up.

## 2018-04-06 LAB — URINE CULTURE: Culture: NO GROWTH

## 2018-05-04 ENCOUNTER — Other Ambulatory Visit: Payer: Self-pay | Admitting: Pulmonary Disease

## 2018-05-05 ENCOUNTER — Other Ambulatory Visit: Payer: Self-pay | Admitting: Cardiology

## 2018-05-06 ENCOUNTER — Ambulatory Visit: Payer: Medicare HMO | Admitting: Cardiology

## 2018-05-13 ENCOUNTER — Ambulatory Visit: Payer: Medicare HMO | Admitting: Cardiology

## 2018-05-13 ENCOUNTER — Encounter: Payer: Self-pay | Admitting: Cardiology

## 2018-05-13 VITALS — BP 122/50 | HR 80 | Ht 66.0 in | Wt 120.8 lb

## 2018-05-13 DIAGNOSIS — I4819 Other persistent atrial fibrillation: Secondary | ICD-10-CM

## 2018-05-13 DIAGNOSIS — I10 Essential (primary) hypertension: Secondary | ICD-10-CM

## 2018-05-13 DIAGNOSIS — E785 Hyperlipidemia, unspecified: Secondary | ICD-10-CM

## 2018-05-13 DIAGNOSIS — I2510 Atherosclerotic heart disease of native coronary artery without angina pectoris: Secondary | ICD-10-CM

## 2018-05-13 MED ORDER — FUROSEMIDE 40 MG PO TABS: 40.0000 mg | ORAL_TABLET | Freq: Every day | ORAL | 3 refills | Status: DC | PRN

## 2018-05-13 MED ORDER — AMLODIPINE BESYLATE 10 MG PO TABS: 10.0000 mg | ORAL_TABLET | Freq: Every day | ORAL | 3 refills | Status: DC

## 2018-05-13 MED ORDER — LISINOPRIL 40 MG PO TABS: 40.0000 mg | ORAL_TABLET | Freq: Every day | ORAL | 3 refills | Status: DC

## 2018-05-13 NOTE — Progress Notes (Signed)
Cardiology Office Note:    Date:  05/13/2018   ID:  [name], DOB 1924/01/04, MRN [id number]  PCP:  Sandi Mariscal, MD  Cardiologist:  No primary care provider on file.    Referring MD: Sandi Mariscal, MD   Chief Complaint  Patient presents with  . Coronary Artery Disease  . Hypertension  . Hyperlipidemia    History of Present Illness:    [name] is a 84 y.o. female with a hx of ASCAD s/p remote PCI of the diag in 1997, persistent atrial fibrillation with CHADS2VASC score of 6 on chronic anticoagulation with Coumadin, HTN, hyperlipidemia.  She is here today for followup and is doing well.  She denies any chest pain or pressure, SOB, DOE, PND, orthopnea, LE edema, dizziness, palpitations or syncope. She is compliant with her meds and is tolerating meds with no SE.    Past Medical History:  Diagnosis Date  . Anxiety   . Arthritis   . CAD (coronary artery disease) 2007   a. 1997 s/p PCI of diagonal.  . Constipation   . COPD/Asthma   . Diverticulosis   . Esophageal reflux   . Family history of adverse reaction to anesthesia    daughter had trouble breathing after surgery, had to be reintubated  . Glaucoma   . HTN (hypertension), benign   . Hyperlipidemia   . Malignant melanoma ([place])    a. 09/2012 s/p resection.  . Neuropathy    lower legs  . PAF (paroxysmal atrial fibrillation) (HCC)    a. intolerant to beta blockers due to asthma and intolerant to CCB due to rash;  b. CHA2DS2VASc = 6-->chronic coumadin;  c. 03/2014 Echo: EF 60-65%, Gr 1 DD, triv AI, mildly dil RV, PASP 91mmhg.  [name] TIA (transient ischemic attack)    a. 02/2011 - chronic coumadin in setting of PAF.  [name] Urinary, incontinence, stress female     Past Surgical History:  Procedure Laterality Date  . ABDOMINAL HYSTERECTOMY    . APPENDECTOMY    . BACK SURGERY     lumbar laminectomy  . CHOLECYSTECTOMY    . COLONOSCOPY    . CORONARY ANGIOPLASTY WITH STENT PLACEMENT    . EYE SURGERY    . NASAL  SINUS SURGERY    . VESICOVAGINAL FISTULA CLOSURE W/ TAH    . WEIL OSTEOTOMY Left 06/12/2016   Procedure: Left Chevron and Aiken Left [name], Weil Osteotomy 2nd and 3rd Metatarsal;  Surgeon: Newt Minion, MD;  Location: [facility];  Service: Orthopedics;  Laterality: Left;    Current Medications: Current Meds  Medication Sig  . acetaminophen (TYLENOL) 325 MG tablet Take 325 mg by mouth every 6 (six) hours as needed for moderate pain.   [name] albuterol (PROAIR HFA) 108 (90 Base) MCG/ACT inhaler Inhale 1-2 puffs into the lungs every 4 (four) hours as needed for wheezing or shortness of breath.  . ALPRAZolam (XANAX) 0.25 MG tablet Take 1 tablet (0.25 mg total) by mouth at bedtime as needed for anxiety. (Patient taking differently: Take 0.25 mg by mouth 2 (two) times daily as needed for anxiety. )  . amLODipine (NORVASC) 10 MG tablet Take 1 tablet (10 mg total) by mouth daily. Patient needs to keep upcoming appointment for further refills  . cetirizine (ZYRTEC) 10 MG tablet Take 10 mg by mouth daily as needed for allergies.  . Cholecalciferol (VITAMIN D) 2000 units tablet Take 2,000 Units by mouth daily.  . fluticasone (FLONASE) 50 MCG/ACT nasal spray  USE 1 SPRAY INTO BOTH NOSTRILS DAILY AS NEEDED FOR ALLERGIES OR RHINITIS (Patient taking differently: Place 1 spray into both nostrils daily. )  . fluticasone furoate-vilanterol (BREO ELLIPTA) 200-25 MCG/INH AEPB Inhale 1 puff into the lungs daily.  . furosemide (LASIX) 40 MG tablet Take 40 mg by mouth daily as needed for fluid.   [name] HYDROcodone-acetaminophen (NORCO) 5-325 MG tablet Take 1 tablet by mouth every 6 (six) hours as needed for severe pain.  [name] latanoprost (XALATAN) 0.005 % ophthalmic solution Place 1 drop into both eyes at bedtime.  . Multiple Vitamins-Minerals (MULTIVITAMIN & MINERAL PO) Take 1 tablet by mouth daily.  . pilocarpine (PILOCAR) 1 % ophthalmic solution Place 1 drop into both eyes 3 (three) times daily.  [name] senna-docusate (SENOKOT-S)  8.6-50 MG tablet Take 2 tablets by mouth at bedtime as needed for mild constipation.  . traZODone (DESYREL) 100 MG tablet Take 100 mg by mouth at bedtime as needed for sleep.   [name] warfarin (COUMADIN) 2.5 MG tablet Take 1-1.5 tablets (2.5-3.75 mg total) by mouth daily. Take 7.5mg  (3 tablets) for the next 3 days (today, Saturday and Sunday). On Monday check INR and resume your normal dosing as noted below.  Take 3.75 mg (1.5 tablets) on Sun, Tues, Weds, Thurs, Sat & Take 2.5 mg (1 tablet) on Mon & Fri (Patient taking differently: Take 5 mg by mouth daily at 6 PM. Checked monthly,  Dose may change accordingly to INR)   Current Facility-Administered Medications for the 05/13/18 encounter (Office Visit) with Sueanne Margarita, MD  Medication  . omalizumab [name]) injection 375 mg     Allergies:   Alphagan [brimonidine]; Apraclonidine; Biaxin [clarithromycin]; Brovana [arformoterol]; Budesonide; Cefdinir; Cephalexin; Cephalexin; Cifenline; Clonidine; Clonidine derivatives; Dorzolamide; Iohexol; Nsaids; Penicillins; Sulfamethoxazole; Sulfonamide derivatives; Tetracycline; Timolol; Vancomycin; Zetia [ezetimibe]; and Cardizem [diltiazem hcl]   Social History   Socioeconomic History  . Marital status: Divorced    Spouse name: Not on file  . Number of children: Not on file  . Years of education: Not on file  . Highest education level: Not on file  Occupational History  . 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   Social Needs  . Financial resource strain: Not on file  . Food insecurity:    Worry: Not on file    Inability: Not on file  . Transportation needs:    Medical: Not on file    Non-medical: Not on file  Tobacco Use  . Smoking status: Former Smoker    Packs/day: 0.20    Years: 25.00    Pack years: 5.00    Types: Cigarettes    Last attempt to quit: 07/06/1966    Years since quitting: 51.8  . Smokeless tobacco: Never Used  Substance and Sexual Activity    . Alcohol use: No    Alcohol/week: 0.0 standard drinks  . Drug use: No  . Sexual activity: Not on file  Lifestyle  . Physical activity:    Days per week: Not on file    Minutes per session: Not on file  . Stress: Not on file  Relationships  . Social connections:    Talks on phone: Not on file    Gets together: Not on file    Attends religious service: Not on file    Active member of club or organization: Not on file    Attends meetings of clubs or organizations: Not on file    Relationship status: Not on  file  Other Topics Concern  . Not on file  Social History Narrative   Lives in [place] with her dog.  She is very independent.     Family History: The patient's family history includes Cancer in her sister; Colon cancer in her mother; Heart attack in her brother and father; Heart disease in her brother and father; Hemachromatosis in her father; Lymphoma in her sister; Peripheral vascular disease in her mother and sister.  ROS:   Please see the history of present illness.    ROS  All other systems reviewed and negative.   EKGs/Labs/Other Studies Reviewed:    The following studies were reviewed today: none  EKG:  EKG is not  ordered today.    Recent Labs: 04/05/2018: ALT 14; BUN 22; Creatinine, Ser 0.77; Hemoglobin 12.4; Platelets 146; Potassium 4.6; Sodium 140   Recent Lipid Panel    Component Value Date/Time   CHOL 134 11/20/2015 1144   TRIG 64 11/20/2015 1144   HDL 76 11/20/2015 1144   CHOLHDL 1.8 11/20/2015 1144   VLDL 13 11/20/2015 1144   LDLCALC 45 11/20/2015 1144    Physical Exam:    VS:  BP (!) 122/50   Pulse 80   Ht 5\' 6"  (1.676 m)   Wt 120 lb 12.8 oz (54.8 kg)   SpO2 97%   BMI 19.50 kg/m     Wt Readings from Last 3 Encounters:  05/13/18 120 lb 12.8 oz (54.8 kg)  03/31/18 133 lb (60.3 kg)  04/18/17 127 lb (57.6 kg)     GEN:  Well nourished, well developed in no acute distress HEENT: Normal NECK: No JVD; No carotid bruits LYMPHATICS: No  lymphadenopathy CARDIAC: RRR, no murmurs, rubs, gallops RESPIRATORY:  Clear to auscultation without rales, wheezing or rhonchi  ABDOMEN: Soft, non-tender, non-distended MUSCULOSKELETAL:  No edema; No deformity  SKIN: Warm and dry NEUROLOGIC:  Alert and oriented x 3 PSYCHIATRIC:  Normal affect   ASSESSMENT:    1. Coronary artery disease involving native coronary artery of native heart without angina pectoris   2. Persistent atrial fibrillation ([place])   3. Essential hypertension   4. Hyperlipidemia LDL goal <70    PLAN:    In order of problems listed above:  1.  ASCAD -status post remote PCI of the diagonal in 1997.  She has not had any anginal symptoms.  She is not on aspirin due to warfarin.  She is intolerant to statin therapy.  2.  Persistent atrial fibrillation -she is maintaining normal sinus rhythm on exam today.  She will continue on warfarin.  She has not had any problems with excessive bleeding.  3.  HTN -BP is well controlled on exam today.  She will continue on amlodipine 10 mg daily and lisinopril 40 mg daily.  Her creatinine was normal at 0.77 and potassium 4.6 on 04/05/2018.  4.  Hyperlipidemia -her LDL goal is less than 70.  She is statin intolerant.  Given her advanced age would not proceed with PCSK9 inhibitors.   Medication Adjustments/Labs and Tests Ordered: Current medicines are reviewed at length with the patient today.  Concerns regarding medicines are outlined above.  No orders of the defined types were placed in this encounter.  No orders of the defined types were placed in this encounter.   Signed, Fransico Him, MD  05/13/2018 9:27 AM    [place]

## 2018-05-13 NOTE — Patient Instructions (Addendum)
Medication Instructions:  Your physician recommends that you continue on your current medications as directed. Please refer to the Current Medication list given to you today.  If you need a refill on your cardiac medications before your next appointment, please call your pharmacy.   Lab work: None ordered  If you have labs (blood work) drawn today and your tests are completely normal, you will receive your results only by: [name] MyChart Message (if you have MyChart) OR . A paper copy in the mail If you have any lab test that is abnormal or we need to change your treatment, we will call you to review the results.  Testing/Procedures: None ordered  Follow-Up: At [facility], you and your health needs are our priority.  As part of our continuing mission to provide you with exceptional heart care, we have created designated Provider Care Teams.  These Care Teams include your primary Cardiologist (physician) and Advanced Practice Providers (APPs -  Physician Assistants and Nurse Practitioners) who all work together to provide you with the care you need, when you need it. You will need a follow up appointment in 12 months.  Please call our office 2 months in advance to schedule this appointment.  You may see Dr. Radford Pax or one of the following Advanced Practice Providers on your designated Care Team:   [place], PA-C Melina Copa, PA-C . Ermalinda Barrios, PA-C  Any Other Special Instructions Will Be Listed Below (If Applicable).

## 2018-06-20 ENCOUNTER — Telehealth: Payer: Self-pay | Admitting: Pulmonary Disease

## 2018-06-20 NOTE — Telephone Encounter (Signed)
Called and spoke with patients granddaughter, she placed her grandmother (patient on the phone) she stated that she would like to talk to someone about starting oxygen. She would like to make an appointment for this. Appointment made. Nothing further needed.

## 2018-06-20 NOTE — Telephone Encounter (Signed)
[name] great [place] daughter of patient called about the need for oxygen for Patient. She can be reached at [phone].

## 2018-06-20 NOTE — Telephone Encounter (Signed)
Called patients grand daughter, unable to reach. Left message to return our call. I did not see where a DPR has been filled out to talk to her.

## 2018-06-21 ENCOUNTER — Encounter: Payer: Self-pay | Admitting: Primary Care

## 2018-06-21 ENCOUNTER — Ambulatory Visit: Payer: Medicare HMO | Admitting: Primary Care

## 2018-06-21 VITALS — BP 124/64 | HR 80 | Temp 97.7°F | Ht 66.0 in | Wt 116.0 lb

## 2018-06-21 DIAGNOSIS — J45909 Unspecified asthma, uncomplicated: Secondary | ICD-10-CM

## 2018-06-21 DIAGNOSIS — J4520 Mild intermittent asthma, uncomplicated: Secondary | ICD-10-CM

## 2018-06-21 LAB — CBC WITH DIFFERENTIAL/PLATELET
BASOS ABS: 0.1 10*3/uL (ref 0.0–0.1)
BASOS PCT: 1.6 % (ref 0.0–3.0)
EOS ABS: 0.7 10*3/uL (ref 0.0–0.7)
Eosinophils Relative: 9.5 % — ABNORMAL HIGH (ref 0.0–5.0)
HEMATOCRIT: 36.3 % (ref 36.0–46.0)
Hemoglobin: 12.1 g/dL (ref 12.0–15.0)
LYMPHS PCT: 25.4 % (ref 12.0–46.0)
Lymphs Abs: 1.9 10*3/uL (ref 0.7–4.0)
MCHC: 33.3 g/dL (ref 30.0–36.0)
MCV: 95.1 fl (ref 78.0–100.0)
MONO ABS: 0.5 10*3/uL (ref 0.1–1.0)
Monocytes Relative: 7.1 % (ref 3.0–12.0)
NEUTROS ABS: 4.2 10*3/uL (ref 1.4–7.7)
Neutrophils Relative %: 56.4 % (ref 43.0–77.0)
RBC: 3.82 Mil/uL — ABNORMAL LOW (ref 3.87–5.11)
RDW: 15 % (ref 11.5–15.5)
WBC: 7.4 10*3/uL (ref 4.0–10.5)

## 2018-06-21 LAB — BRAIN NATRIURETIC PEPTIDE: PRO B NATRI PEPTIDE: 28 pg/mL (ref 0.0–100.0)

## 2018-06-21 LAB — NITRIC OXIDE: FeNO level (ppb): 15

## 2018-06-21 MED ORDER — PREDNISONE 10 MG PO TABS: ORAL_TABLET | ORAL | 0 refills | Status: DC

## 2018-06-21 MED ORDER — IPRATROPIUM-ALBUTEROL 0.5-2.5 (3) MG/3ML IN SOLN: 3.0000 mL | Freq: Four times a day (QID) | RESPIRATORY_TRACT | 0 refills | Status: DC | PRN

## 2018-06-21 MED ORDER — IPRATROPIUM-ALBUTEROL 0.5-2.5 (3) MG/3ML IN SOLN: 3.0000 mL | Freq: Four times a day (QID) | RESPIRATORY_TRACT | 2 refills | Status: DC | PRN

## 2018-06-21 NOTE — Assessment & Plan Note (Signed)
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 Lake Bells

## 2018-06-21 NOTE — Progress Notes (Signed)
@Patient  ID: [name], female    DOB: 08/29/1923, 84 y.o.   MRN: [id number]  Chief Complaint  Patient presents with  . Follow-up    possible oxygen need;SOB with exertion    Referring provider: Sandi Mariscal, MD  HPI: 84 year old female, former smoker (quit age 57). PMH significant for severe persistent asthma, nasal polyps and COPD. Patient of Dr. Lake Bells, last seen by pulmonary NP on 02/17/17. IgE 855, previously on Xolair. Maintained on Breo, Flonase and zyrtec. Prn albuterol.   06/21/2018 Patient here to qualify for home oxygen. Accompanied by her granddaughter. She has not been seen in clinic for close to 1.5 years. Complains of increased sob on exertion. Associated cough with mostly clear mucus, occasionally gray. Hx asthma/COPD, she stopped xolair over a year ago d/t risk of malignancy associated with drug use. Called EMS to home yesterday, she was not taken to hospital and received neb treatment with improvement.  O2 94% RA at rest today.  PFT 2009 - FEV1 1.23 (64%), ratio 57, DLCO 67%   Allergies  Allergen Reactions  . Alphagan [Brimonidine] Other (See Comments)    Burning sensation  . Apraclonidine Other (See Comments)    Pain, brow pain, tender, not able to tolerate  . Biaxin [Clarithromycin]     Unknown, per pt   . Brovana [Arformoterol] Other (See Comments)    Makes my heart race  . Budesonide Other (See Comments)    Makes my heart race  . Cefdinir     "FEEL HORRIBLE"  . Cephalexin Itching  . Cephalexin Nausea Only  . Cifenline Itching  . Clonidine Other (See Comments)    unknown  . Clonidine Derivatives Other (See Comments)    REACTION: unknown  . Dorzolamide Other (See Comments)    Redness  . Iohexol Other (See Comments)     Desc: NOTES FROM PRIOR CT STATES PRE MEDICATION PRIOR TO OMNIPAQUE ENHANCED CT   . Nsaids Other (See Comments)    Unknown  . Penicillins Itching, Swelling and Other (See Comments)    Swelling and edema of ears and  rash Swelling to ears  Has patient had a PCN reaction causing immediate rash, facial/tongue/throat swelling, SOB or lightheadedness with hypotension: {no Has patient had a PCN reaction causing severe rash involving mucus membranes or skin necrosis: {no Has patient had a PCN reaction that required hospitalization no Has patient had a PCN reaction occurring within the last 10 years: no If all of the above answers are "NO", then may proceed with Cephalosporin use.  . Sulfamethoxazole Other (See Comments)    Unknown  . Sulfonamide Derivatives Itching  . Tetracycline Itching and Other (See Comments)    unknown  . Timolol Other (See Comments)    Eye red and irritated   . Vancomycin Other (See Comments)    Turn red  . Zetia [Ezetimibe] Other (See Comments)    Unknown  . Cardizem [Diltiazem Hcl] Rash    Immunization History  Administered Date(s) Administered  . Influenza Split 04/08/2011, 04/05/2012  . Influenza Whole 05/05/2006, 04/18/2009, 04/23/2010, 04/06/2011  . Influenza, High Dose Seasonal PF 04/25/2013, 03/27/2016, 04/23/2017  . Influenza,inj,Quad PF,6+ Mos 06/05/2014, 06/07/2015  . Influenza,trivalent, recombinat, inj, PF 07/06/2009, 04/05/2013  . Pneumococcal Conjugate-13 07/22/2010  . Pneumococcal Polysaccharide-23 07/06/2004  . Tdap 07/06/2009    Past Medical History:  Diagnosis Date  . Anxiety   . Arthritis   . CAD (coronary artery disease) 2007   a. 1997 s/p PCI of diagonal.  .  Constipation   . COPD/Asthma   . Diverticulosis   . Esophageal reflux   . Family history of adverse reaction to anesthesia    daughter had trouble breathing after surgery, had to be reintubated  . Glaucoma   . HTN (hypertension), benign   . Hyperlipidemia   . Malignant melanoma ([place])    a. 09/2012 s/p resection.  . Neuropathy    lower legs  . PAF (paroxysmal atrial fibrillation) (HCC)    a. intolerant to beta blockers due to asthma and intolerant to CCB due to rash;  b. CHA2DS2VASc =  6-->chronic coumadin;  c. 03/2014 Echo: EF 60-65%, Gr 1 DD, triv AI, mildly dil RV, PASP 66mmhg.  [name] TIA (transient ischemic attack)    a. 02/2011 - chronic coumadin in setting of PAF.  [name] Urinary, incontinence, stress female     Tobacco History: Social History   Tobacco Use  Smoking Status Former Smoker  . Packs/day: 0.20  . Years: 25.00  . Pack years: 5.00  . Types: Cigarettes  . Last attempt to quit: 07/06/1966  . Years since quitting: 51.9  Smokeless Tobacco Never Used   Counseling given: Not Answered   Outpatient Medications Prior to Visit  Medication Sig Dispense Refill  . acetaminophen (TYLENOL) 325 MG tablet Take 325 mg by mouth every 6 (six) hours as needed for moderate pain.     [name] albuterol (PROAIR HFA) 108 (90 Base) MCG/ACT inhaler Inhale 1-2 puffs into the lungs every 4 (four) hours as needed for wheezing or shortness of breath. 1 Inhaler 3  . ALPRAZolam (XANAX) 0.25 MG tablet Take 1 tablet (0.25 mg total) by mouth at bedtime as needed for anxiety. (Patient taking differently: Take 0.25 mg by mouth 2 (two) times daily as needed for anxiety. ) 30 tablet 0  . amLODipine (NORVASC) 10 MG tablet Take 1 tablet (10 mg total) by mouth daily. 90 tablet 3  . cetirizine (ZYRTEC) 10 MG tablet Take 10 mg by mouth daily as needed for allergies.    . Cholecalciferol (VITAMIN D) 2000 units tablet Take 2,000 Units by mouth daily.    . fluticasone (FLONASE) 50 MCG/ACT nasal spray USE 1 SPRAY INTO BOTH NOSTRILS DAILY AS NEEDED FOR ALLERGIES OR RHINITIS (Patient taking differently: Place 1 spray into both nostrils daily. ) 16 g 5  . fluticasone furoate-vilanterol (BREO ELLIPTA) 200-25 MCG/INH AEPB Inhale 1 puff into the lungs daily. 60 each 11  . furosemide (LASIX) 40 MG tablet Take 1 tablet (40 mg total) by mouth daily as needed for fluid. 90 tablet 3  . HYDROcodone-acetaminophen (NORCO) 5-325 MG tablet Take 1 tablet by mouth every 6 (six) hours as needed for severe pain. 20 tablet 0  .  latanoprost (XALATAN) 0.005 % ophthalmic solution Place 1 drop into both eyes at bedtime.    [name] lisinopril (PRINIVIL,ZESTRIL) 40 MG tablet Take 1 tablet (40 mg total) by mouth daily. 90 tablet 3  . Multiple Vitamins-Minerals (MULTIVITAMIN & MINERAL PO) Take 1 tablet by mouth daily.    . pilocarpine (PILOCAR) 1 % ophthalmic solution Place 1 drop into both eyes 3 (three) times daily.  9  . senna-docusate (SENOKOT-S) 8.6-50 MG tablet Take 2 tablets by mouth at bedtime as needed for mild constipation. 30 tablet 0  . traZODone (DESYREL) 100 MG tablet Take 100 mg by mouth at bedtime as needed for sleep.   2  . warfarin (COUMADIN) 2.5 MG tablet Take 1-1.5 tablets (2.5-3.75 mg total) by mouth daily. Take 7.5mg  (3  tablets) for the next 3 days (today, Saturday and Sunday). On Monday check INR and resume your normal dosing as noted below.  Take 3.75 mg (1.5 tablets) on Sun, Tues, Weds, Thurs, Sat & Take 2.5 mg (1 tablet) on Mon & Fri (Patient taking differently: Take 5 mg by mouth daily at 6 PM. Checked monthly,  Dose may change accordingly to INR) 30 tablet 0   Facility-Administered Medications Prior to Visit  Medication Dose Route Frequency Provider Last Rate Last Dose  . omalizumab [name]) injection 375 mg  375 mg Subcutaneous Q14 Days Juanito Doom, MD   375 mg at 01/11/17 7858    Review of Systems  Review of Systems  Constitutional: Negative.   HENT: Positive for postnasal drip.   Respiratory: Positive for cough, shortness of breath and wheezing.   Cardiovascular: Negative.     Physical Exam  BP 124/64 (BP Location: Left Arm, Cuff Size: Normal)   Pulse 80   Temp 97.7 F (36.5 C)   Ht 5\' 6"  (1.676 m)   Wt 116 lb (52.6 kg)   SpO2 94%   BMI 18.72 kg/m  Physical Exam Constitutional:      Appearance: Normal appearance.     Comments: Thin elderly woman in no acute distress  HENT:     Head: Normocephalic and atraumatic.     Nose: Nose normal.     Mouth/Throat:     Mouth: Mucous  membranes are moist.     Pharynx: Oropharynx is clear.  Eyes:     Extraocular Movements: Extraocular movements intact.     Pupils: Pupils are equal, round, and reactive to light.  Neck:     Musculoskeletal: Normal range of motion and neck supple.  Cardiovascular:     Rate and Rhythm: Normal rate and regular rhythm.  Pulmonary:     Effort: Pulmonary effort is normal. No respiratory distress.     Breath sounds: Wheezing present.     Comments: Faint insp/exp wheezes t/o. +congested cough Skin:    General: Skin is warm and dry.  Neurological:     General: No focal deficit present.     Mental Status: She is alert and oriented to person, place, and time. Mental status is at baseline.  Psychiatric:        Mood and Affect: Mood normal.        Behavior: Behavior normal.        Thought Content: Thought content normal.        Judgment: Judgment normal.     Comments: Anxious      Lab Results:  CBC    Component Value Date/Time   WBC 6.3 04/05/2018 1343   RBC 3.97 04/05/2018 1343   HGB 12.4 04/05/2018 1343   HCT 36.6 04/05/2018 1343   PLT 146 (L) 04/05/2018 1343   MCV 92.2 04/05/2018 1343   MCH 31.2 04/05/2018 1343   MCHC 33.9 04/05/2018 1343   RDW 14.4 04/05/2018 1343   LYMPHSABS 1.6 04/05/2018 1343   MONOABS 0.5 04/05/2018 1343   EOSABS 0.3 04/05/2018 1343   BASOSABS 0.0 04/05/2018 1343    BMET    Component Value Date/Time   NA 140 04/05/2018 1343   K 4.6 04/05/2018 1343   CL 101 04/05/2018 1343   CO2 28 04/05/2018 1343   GLUCOSE 107 (H) 04/05/2018 1343   BUN 22 04/05/2018 1343   CREATININE 0.77 04/05/2018 1343   CREATININE 0.93 (H) 09/24/2015 1150   CALCIUM 9.4 04/05/2018 1343   GFRNONAA >  60 04/05/2018 1343   GFRAA >60 04/05/2018 1343    BNP No results found for: BNP  ProBNP No results found for: PROBNP  Imaging: No results found.   Assessment & Plan:  84 year old female, hx severe persistent asthma, copd, nasal polyps. She was previously on Xolair but  stopped over a year ago d/t risk of malignancy associated with drug use. Continues Breo 1 puff daily. Called EMX yesterday for sob, received nebulizer treatment with improvement and was not transported to the ED. Called office to see if she would qualify for home oxygen. Patient was ambulated today and did well, O2 low 91% on room air. FENO was 15 today. On exam she had faint insp/exp wheezes throughout lung fields. Needs prednisone course, recommended slow taper to 5mg  daily until next office visit. Checking CBC with dif, IgE and BNP. May need to consider another biologic for asthma symptom control. Will add singulair daily at bedtime and duonebs every 6 hours d/t obstruction on PFTS back in 2009. Needs follow up with Dr. Lake Bells, ordering full PFTs to be completed prior.   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 Tomasa Blase, NP 06/21/2018

## 2018-06-21 NOTE — Patient Instructions (Addendum)
Testing today: FENO- 15 (normal) Ambulatory O2- low 91% room air (normal)  RX: Start Singulair 1 tab by mouth at bedtime  Prednisone 20mg  x 5 days; 10mg  x 5 days; 5 mg daily until next follow-up Duonebs every 6 hours as needed for sob/wheezing (if rescue inhaler not effective)  Orders: Labs today  Ambulatory referral for nebulizer machine and supplies  Recommendations: Continue Breo- 1 puff daily PRN albuterol  Continue Zyrtec and flonase daily  Albuterol 2 puffs every 4-6 hours for shortness or breath/wheezing  Follow-up: Next available with Dr. Lake Bells  PFT prior to apt

## 2018-06-21 NOTE — Progress Notes (Signed)
Reviewed, agree 

## 2018-06-22 LAB — IGE: IGE (IMMUNOGLOBULIN E), SERUM: 595 kU/L — AB (ref <=114)

## 2018-06-22 MED ORDER — MONTELUKAST SODIUM 10 MG PO TABS: 10.0000 mg | ORAL_TABLET | Freq: Every day | ORAL | 11 refills | Status: DC

## 2018-06-22 NOTE — Addendum Note (Signed)
Addended by: Karmen Stabs on: 06/22/2018 04:07 PM   Modules accepted: Orders

## 2018-06-24 MED ORDER — LEVALBUTEROL HCL 0.63 MG/3ML IN NEBU
0.6300 mg | INHALATION_SOLUTION | RESPIRATORY_TRACT | Status: AC
  Administered 2018-06-21: 0.63 mg via RESPIRATORY_TRACT

## 2018-06-24 NOTE — Addendum Note (Signed)
Addended by: Karmen Stabs on: 06/24/2018 10:58 AM   Modules accepted: Orders

## 2018-07-27 ENCOUNTER — Other Ambulatory Visit: Payer: Self-pay | Admitting: Pulmonary Disease

## 2018-07-27 DIAGNOSIS — R0602 Shortness of breath: Secondary | ICD-10-CM

## 2018-07-29 ENCOUNTER — Encounter: Payer: Self-pay | Admitting: Pulmonary Disease

## 2018-07-29 ENCOUNTER — Ambulatory Visit (INDEPENDENT_AMBULATORY_CARE_PROVIDER_SITE_OTHER): Payer: Medicare HMO | Admitting: Pulmonary Disease

## 2018-07-29 ENCOUNTER — Ambulatory Visit: Payer: Medicare HMO | Admitting: Pulmonary Disease

## 2018-07-29 VITALS — BP 112/68 | HR 63 | Ht 66.0 in | Wt 117.4 lb

## 2018-07-29 DIAGNOSIS — R0602 Shortness of breath: Secondary | ICD-10-CM | POA: Diagnosis not present

## 2018-07-29 DIAGNOSIS — J4550 Severe persistent asthma, uncomplicated: Secondary | ICD-10-CM | POA: Diagnosis not present

## 2018-07-29 DIAGNOSIS — J449 Chronic obstructive pulmonary disease, unspecified: Secondary | ICD-10-CM | POA: Diagnosis not present

## 2018-07-29 LAB — PULMONARY FUNCTION TEST
DL/VA % pred: 85 %
DL/VA: 4.31 ml/min/mmHg/L
DLCO cor % pred: 51 %
DLCO cor: 13.83 ml/min/mmHg
DLCO unc % pred: 49 %
DLCO unc: 13.24 ml/min/mmHg
FEF 25-75 Post: 0.56 L/sec
FEF 25-75 Pre: 0.4 L/sec
FEF2575-%Change-Post: 40 %
FEF2575-%Pred-Post: 63 %
FEF2575-%Pred-Pre: 45 %
FEV1-%Change-Post: 10 %
FEV1-%Pred-Post: 57 %
FEV1-%Pred-Pre: 52 %
FEV1-POST: 0.98 L
FEV1-Pre: 0.88 L
FEV1FVC-%Change-Post: 2 %
FEV1FVC-%Pred-Pre: 83 %
FEV6-%Change-Post: 8 %
FEV6-%Pred-Post: 74 %
FEV6-%Pred-Pre: 69 %
FEV6-Post: 1.6 L
FEV6-Pre: 1.48 L
FEV6FVC-%Pred-Post: 107 %
FEV6FVC-%Pred-Pre: 107 %
FVC-%Change-Post: 8 %
FVC-%Pred-Post: 69 %
FVC-%Pred-Pre: 64 %
FVC-Post: 1.6 L
FVC-Pre: 1.48 L
PRE FEV6/FVC RATIO: 100 %
Post FEV1/FVC ratio: 61 %
Post FEV6/FVC ratio: 100 %
Pre FEV1/FVC ratio: 60 %
RV % pred: 119 %
RV: 3.25 L
TLC % PRED: 87 %
TLC: 4.69 L

## 2018-07-29 MED ORDER — ALBUTEROL SULFATE (2.5 MG/3ML) 0.083% IN NEBU: 2.5000 mg | INHALATION_SOLUTION | Freq: Four times a day (QID) | RESPIRATORY_TRACT | 11 refills | Status: DC | PRN

## 2018-07-29 NOTE — Patient Instructions (Signed)
COPD-asthma overlap syndrome with recurrent exacerbations and elevated serum IgE: I would like to get you back on Xolair again, we will inquire about financial assistance for this medicine Continue Breo 201 puff daily no matter how you feel Use albuterol nebulized as needed for chest tightness wheezing or shortness of breath Do not take Brovana Do not take Pulmicort  We will see you back in 6 to 8 weeks to see how you are doing on your current medical regimen and reconsider Xolair, nurse practitioner visit  Greater than 50% of this 30-minute visit spent face-to-face

## 2018-07-29 NOTE — Progress Notes (Signed)
PFT completed today.  

## 2018-07-29 NOTE — Progress Notes (Signed)
Subjective:    Patient ID: [name], female    DOB: 05/16/1924, 85 y.o.   MRN: [id number]  Synopsis: Former patient of Dr. Gwenette Dennis with asthma and COPD.  She started smoking as a child and smoked until age 7, but she smoked only about 1 cigarette per week.  She started having lung problems in 2000. No change with singulair or higher dose advair. On Xolair with good clinical response >> d/ced 08/2012 due to expense.  +nasal polyps per ENT evaluation >> [name].  Recommended surgery but pt declined. On topical nasal steroids.   Was treated with Xolair again in 2017 through 2018 and did quite well without exacerbations but stopped once again because of cost concerns.   HPI Chief Complaint  Patient presents with  . Asthma    Breathing is unchanged since last OV. PFT was completed today.   [name] returns to see me for the first time in about a year and a half.  When I last saw her it was August 2018 and she was still taking Xolair for the second course of her life.  At that time she was free of exacerbations and felt quite well.  However, shortly after that she stopped taking the medicine because she did not like to pay the $160 co-pay.  Since then she has had several episodes of increased shortness of breath and her great granddaughter who is with her today tells me that she has had to go to the emergency room several times because of increasing shortness of breath.  She was here last month for an asthma exacerbation and prednisone was prescribed once again.  She says that a nebulizer machine was prescribed but she has not received it yet.  She had a lung function test today.   Past Medical History:  Diagnosis Date  . Anxiety   . Arthritis   . CAD (coronary artery disease) 2007   a. 1997 s/p PCI of diagonal.  . Constipation   . COPD/Asthma   . Diverticulosis   . Esophageal reflux   . Family history of adverse reaction to anesthesia    daughter had trouble breathing after  surgery, had to be reintubated  . Glaucoma   . HTN (hypertension), benign   . Hyperlipidemia   . Malignant melanoma ([place])    a. 09/2012 s/p resection.  . Neuropathy    lower legs  . PAF (paroxysmal atrial fibrillation) (HCC)    a. intolerant to beta blockers due to asthma and intolerant to CCB due to rash;  b. CHA2DS2VASc = 6-->chronic coumadin;  c. 03/2014 Echo: EF 60-65%, Gr 1 DD, triv AI, mildly dil RV, PASP 29mmhg.  [name] TIA (transient ischemic attack)    a. 02/2011 - chronic coumadin in setting of PAF.  [name] Urinary, incontinence, stress female       Review of Systems  Constitutional: Negative for chills, fatigue and fever.  HENT: Positive for postnasal drip, rhinorrhea and sinus pressure.   Respiratory: Negative for cough, shortness of breath and wheezing.   Cardiovascular: Negative for chest pain, palpitations and leg swelling.       Objective:   Physical Exam Vitals:   07/29/18 1458  BP: 112/68  Pulse: 63  SpO2: 95%  Weight: 117 lb 6.4 oz (53.3 kg)  Height: 5\' 6"  (1.676 m)   RA  Gen: chronically ill appearing HENT: OP clear, TM's clear, neck supple PULM: Poor air movement B, normal percussion CV: RRR, no mgr, trace edema GI: BS+,  soft, nontender Derm: no cyanosis or rash Psyche: normal mood and affect   PFT PFT's 2009:  FEV1 1.23 (64%), ratio 57, DLCO 67%. January 2020 pulmonary function testing ratio 60%, FEV1 0.98 L 57% predicted, total lung capacity 4.7 L 87% predicted, DLCO 13.24 mL 49% predicted  Labs: [facility] 2006:  IgE 855, multiple significant allergens. 06/2015 IgE 872 kU/L 06/2018 IgE 595 kU/L Elevated Eos on CBC 08/2014        Assessment & Plan:   Severe persistent asthma without complication  COPD with asthma ([place])  Discussion: [name] is having exacerbations of her severe persistent asthma again.  In years past she has benefited on 2 separate occasions from [facility] but has stopped it because of cost concerns.  Today we talked about this at  length.  She had some incorrect assumptions about Xolair that it was able to cause cancer so we talked about that quite a bit.  She is willing to go back on the medicine now but she wants to try to apply for financial assistance if possible.  Plan: COPD-asthma overlap syndrome with recurrent exacerbations and elevated serum IgE: I would like to get you back on Xolair again, we will inquire about financial assistance for this medicine Continue Breo 201 puff daily no matter how you feel Use albuterol nebulized as needed for chest tightness wheezing or shortness of breath Do not take Brovana Do not take Pulmicort  We will see you back in 6 to 8 weeks to see how you are doing on your current medical regimen and reconsider Xolair, nurse practitioner visit  Greater than 50% of this 30-minute visit spent face-to-face    Current Outpatient Medications:  .  acetaminophen (TYLENOL) 325 MG tablet, Take 325 mg by mouth every 6 (six) hours as needed for moderate pain. , Disp: , Rfl:  .  albuterol (PROAIR HFA) 108 (90 Base) MCG/ACT inhaler, Inhale 1-2 puffs into the lungs every 4 (four) hours as needed for wheezing or shortness of breath., Disp: 1 Inhaler, Rfl: 3 .  ALPRAZolam (XANAX) 0.25 MG tablet, Take 1 tablet (0.25 mg total) by mouth at bedtime as needed for anxiety. (Patient taking differently: Take 0.25 mg by mouth 2 (two) times daily as needed for anxiety. ), Disp: 30 tablet, Rfl: 0 .  amLODipine (NORVASC) 10 MG tablet, Take 1 tablet (10 mg total) by mouth daily., Disp: 90 tablet, Rfl: 3 .  cetirizine (ZYRTEC) 10 MG tablet, Take 10 mg by mouth daily as needed for allergies., Disp: , Rfl:  .  Cholecalciferol (VITAMIN D) 2000 units tablet, Take 2,000 Units by mouth daily., Disp: , Rfl:  .  fluticasone (FLONASE) 50 MCG/ACT nasal spray, USE 1 SPRAY INTO BOTH NOSTRILS DAILY AS NEEDED FOR ALLERGIES OR RHINITIS (Patient taking differently: Place 1 spray into both nostrils daily. ), Disp: 16 g, Rfl: 5 .   fluticasone furoate-vilanterol (BREO ELLIPTA) 200-25 MCG/INH AEPB, Inhale 1 puff into the lungs daily., Disp: 60 each, Rfl: 11 .  furosemide (LASIX) 40 MG tablet, Take 1 tablet (40 mg total) by mouth daily as needed for fluid., Disp: 90 tablet, Rfl: 3 .  HYDROcodone-acetaminophen (NORCO) 5-325 MG tablet, Take 1 tablet by mouth every 6 (six) hours as needed for severe pain., Disp: 20 tablet, Rfl: 0 .  ipratropium-albuterol (DUONEB) 0.5-2.5 (3) MG/3ML SOLN, Take 3 mLs by nebulization every 6 (six) hours as needed (shortness of breath/wheezing)., Disp: 360 mL, Rfl: 2 .  latanoprost (XALATAN) 0.005 % ophthalmic solution, Place 1 drop into  both eyes at bedtime., Disp: , Rfl:  .  lisinopril (PRINIVIL,ZESTRIL) 40 MG tablet, Take 1 tablet (40 mg total) by mouth daily., Disp: 90 tablet, Rfl: 3 .  montelukast (SINGULAIR) 10 MG tablet, Take 1 tablet (10 mg total) by mouth at bedtime., Disp: 30 tablet, Rfl: 11 .  Multiple Vitamins-Minerals (MULTIVITAMIN & MINERAL PO), Take 1 tablet by mouth daily., Disp: , Rfl:  .  pilocarpine (PILOCAR) 1 % ophthalmic solution, Place 1 drop into both eyes 3 (three) times daily., Disp: , Rfl: 9 .  senna-docusate (SENOKOT-S) 8.6-50 MG tablet, Take 2 tablets by mouth at bedtime as needed for mild constipation., Disp: 30 tablet, Rfl: 0 .  traZODone (DESYREL) 100 MG tablet, Take 100 mg by mouth at bedtime as needed for sleep. , Disp: , Rfl: 2 .  warfarin (COUMADIN) 2.5 MG tablet, Take 1-1.5 tablets (2.5-3.75 mg total) by mouth daily. Take 7.5mg  (3 tablets) for the next 3 days (today, Saturday and Sunday). On Monday check INR and resume your normal dosing as noted below.  Take 3.75 mg (1.5 tablets) on Sun, Tues, Weds, Thurs, Sat & Take 2.5 mg (1 tablet) on Mon & Fri (Patient taking differently: Take 5 mg by mouth daily at 6 PM. Checked monthly,  Dose may change accordingly to INR), Disp: 30 tablet, Rfl: 0  Current Facility-Administered Medications:  .  omalizumab (XOLAIR) injection 375  mg, 375 mg, Subcutaneous, Q14 Days, Simonne Maffucci B, MD, 375 mg at 01/11/17 [phone]

## 2018-08-16 ENCOUNTER — Telehealth: Payer: Self-pay | Admitting: Pulmonary Disease

## 2018-08-16 NOTE — Telephone Encounter (Signed)
Prefilled Syringe: #150mg  4  #75mg  2 Ordered Date: 08/16/2018 Shipping Date: 08/17/2018

## 2018-08-18 NOTE — Telephone Encounter (Signed)
Prefilled Syringes: # 150mg  4  #75mg  2 Arrival Date: 08/18/2018 Lot #: [id number]  [id number]      75mg  [id number] Exp Date: 150mg  12/2018   75mg  01/2019

## 2018-08-29 DIAGNOSIS — S52502A Unspecified fracture of the lower end of left radius, initial encounter for closed fracture: Secondary | ICD-10-CM | POA: Insufficient documentation

## 2018-09-05 ENCOUNTER — Telehealth: Payer: Self-pay | Admitting: Pulmonary Disease

## 2018-09-05 MED ORDER — EPINEPHRINE 0.3 MG/0.3ML IJ SOAJ: 0.3000 mg | Freq: Once | INTRAMUSCULAR | 11 refills | Status: AC

## 2018-09-05 NOTE — Telephone Encounter (Signed)
An order was sent for a nebulizer machine to [facility]. I called [facility] but there was a busty signal x 3. Will try to call again  later.   TS please advise on Xolair patient assistance.

## 2018-09-05 NOTE — Telephone Encounter (Signed)
Rx sent 

## 2018-09-05 NOTE — Telephone Encounter (Signed)
Called pt's granddaughter, We received a letter from [facility] pt will be getting her Xolair free, for as long as it's available. Made pt appt for Fri at 2:15. Pt. Will wait for 2 hrs.. Pt has an appt. At 3:00 with EW for a f/u.

## 2018-09-07 NOTE — Telephone Encounter (Signed)
Tammy can this be closed?

## 2018-09-09 ENCOUNTER — Ambulatory Visit: Payer: Medicare HMO

## 2018-09-09 ENCOUNTER — Ambulatory Visit: Payer: Medicare HMO | Admitting: Primary Care

## 2018-09-13 ENCOUNTER — Ambulatory Visit: Payer: Medicare HMO | Admitting: Primary Care

## 2018-09-13 ENCOUNTER — Ambulatory Visit: Payer: Medicare HMO

## 2018-09-15 ENCOUNTER — Ambulatory Visit (INDEPENDENT_AMBULATORY_CARE_PROVIDER_SITE_OTHER): Payer: Medicare HMO

## 2018-09-15 ENCOUNTER — Ambulatory Visit: Payer: Medicare HMO

## 2018-09-15 ENCOUNTER — Other Ambulatory Visit: Payer: Self-pay

## 2018-09-15 DIAGNOSIS — J4550 Severe persistent asthma, uncomplicated: Secondary | ICD-10-CM

## 2018-09-15 MED ORDER — OMALIZUMAB 150 MG ~~LOC~~ SOLR
375.0000 mg | Freq: Once | SUBCUTANEOUS | Status: AC
  Administered 2018-09-15: 375 mg via SUBCUTANEOUS

## 2018-09-15 NOTE — Telephone Encounter (Signed)
Gave pt her 1st Xolair inj. Claiborne Rigg a few yrs. (restart) Monitored pt q 15 to 20 mins.. After about 25 mins. More pt's arm was red raised ,swollen, itching, and warm to touch. Went and asked App of the afternoon Tonya, " Cold compress and Benadryl. Tell pt if she starts wheezing or is SOB on to Urgent Care or ED." Gave pt APP's recs, pt understood and said she would comply. Arm was still itching like a mesquite bite, when pt left at 4:50. Nothing further needed.

## 2018-09-29 ENCOUNTER — Ambulatory Visit: Payer: Medicare HMO

## 2018-11-07 ENCOUNTER — Other Ambulatory Visit: Payer: Self-pay

## 2018-11-07 ENCOUNTER — Ambulatory Visit (INDEPENDENT_AMBULATORY_CARE_PROVIDER_SITE_OTHER): Payer: Medicare HMO

## 2018-11-07 DIAGNOSIS — J4550 Severe persistent asthma, uncomplicated: Secondary | ICD-10-CM

## 2018-11-07 MED ORDER — OMALIZUMAB 150 MG/ML ~~LOC~~ SOSY
300.0000 mg | PREFILLED_SYRINGE | Freq: Once | SUBCUTANEOUS | Status: AC
  Administered 2018-11-07: 300 mg via SUBCUTANEOUS

## 2018-11-07 MED ORDER — OMALIZUMAB 75 MG/0.5ML ~~LOC~~ SOSY
75.0000 mg | PREFILLED_SYRINGE | Freq: Once | SUBCUTANEOUS | Status: AC
  Administered 2018-11-07: 75 mg via SUBCUTANEOUS

## 2018-11-07 NOTE — Progress Notes (Signed)
Have you been hospitalized within the last 10 days?  No Do you have a fever?  No Do you have a cough?  Yes dry Do you have a headache or sore throat? No

## 2018-11-14 ENCOUNTER — Telehealth: Payer: Self-pay | Admitting: Pulmonary Disease

## 2018-11-14 NOTE — Telephone Encounter (Addendum)
Xolair Prefilled Syringe Order: 150mg  Prefilled Syringe:  #4 75mg  Prefilled Syringe: #2 Ordered Date: 11/14/2018 Expected date of arrival: 11/18/2018 Ordered by: Laurette Schimke  Order#: [id number] Speciality Pharmacy: [facility]

## 2018-11-17 ENCOUNTER — Other Ambulatory Visit: Payer: Self-pay | Admitting: Pulmonary Disease

## 2018-11-18 NOTE — Telephone Encounter (Signed)
Xolair Prefilled Syringe Received:  150mg  Prefilled Syringe >> quantity 4, lot # [id number], exp date 08/2019 75mg  Prefilled Syringe >> quantity 2, lot # [id number], exp date 09/2019 Medication arrival date: 11/18/2018 Received by: Desmond Dike, [place]

## 2018-11-21 ENCOUNTER — Ambulatory Visit: Payer: Medicare HMO

## 2018-11-24 DIAGNOSIS — M25561 Pain in right knee: Secondary | ICD-10-CM | POA: Insufficient documentation

## 2018-11-24 DIAGNOSIS — M25562 Pain in left knee: Secondary | ICD-10-CM | POA: Insufficient documentation

## 2018-11-25 ENCOUNTER — Other Ambulatory Visit: Payer: Self-pay

## 2018-11-25 ENCOUNTER — Ambulatory Visit (INDEPENDENT_AMBULATORY_CARE_PROVIDER_SITE_OTHER): Payer: Medicare HMO

## 2018-11-25 DIAGNOSIS — J4550 Severe persistent asthma, uncomplicated: Secondary | ICD-10-CM

## 2018-11-25 MED ORDER — OMALIZUMAB 75 MG/0.5ML ~~LOC~~ SOSY
75.0000 mg | PREFILLED_SYRINGE | Freq: Once | SUBCUTANEOUS | Status: AC
  Administered 2018-11-25: 10:00:00 75 mg via SUBCUTANEOUS

## 2018-11-25 MED ORDER — OMALIZUMAB 150 MG/ML ~~LOC~~ SOSY
300.0000 mg | PREFILLED_SYRINGE | Freq: Once | SUBCUTANEOUS | Status: AC
  Administered 2018-11-25: 300 mg via SUBCUTANEOUS

## 2018-11-25 NOTE — Progress Notes (Signed)
Have you been hospitalized within the last 10 days?  No Do you have a fever?  No Do you have a cough?  No Do you have a headache or sore throat? No  

## 2018-12-09 ENCOUNTER — Ambulatory Visit: Payer: Medicare HMO

## 2018-12-14 ENCOUNTER — Ambulatory Visit: Payer: Medicare HMO

## 2018-12-20 ENCOUNTER — Other Ambulatory Visit: Payer: Self-pay

## 2018-12-20 ENCOUNTER — Ambulatory Visit (INDEPENDENT_AMBULATORY_CARE_PROVIDER_SITE_OTHER): Payer: Medicare HMO

## 2018-12-20 DIAGNOSIS — J4550 Severe persistent asthma, uncomplicated: Secondary | ICD-10-CM

## 2018-12-20 MED ORDER — OMALIZUMAB 75 MG/0.5ML ~~LOC~~ SOSY
75.0000 mg | PREFILLED_SYRINGE | Freq: Once | SUBCUTANEOUS | Status: AC
  Administered 2018-12-20: 75 mg via SUBCUTANEOUS

## 2018-12-20 MED ORDER — OMALIZUMAB 150 MG/ML ~~LOC~~ SOSY
300.0000 mg | PREFILLED_SYRINGE | Freq: Once | SUBCUTANEOUS | Status: AC
  Administered 2018-12-20: 300 mg via SUBCUTANEOUS

## 2018-12-20 NOTE — Progress Notes (Signed)
Have you been hospitalized within the last 10 days?  No Do you have a fever?  No Do you have a cough?  Yes dry cough Do you have a headache or sore throat? No

## 2018-12-26 ENCOUNTER — Telehealth: Payer: Self-pay | Admitting: Pulmonary Disease

## 2018-12-26 NOTE — Telephone Encounter (Signed)
Xolair Prefilled Syringe Order: 150mg  Prefilled Syringe:  #4 75mg  Prefilled Syringe: #2 Ordered Date: 12/26/2018 Expected date of arrival: 12/30/2018 Ordered by: Desmond Dike, [place]: [facility]   Order #: [id number]

## 2018-12-30 NOTE — Telephone Encounter (Signed)
Xolair Prefilled Syringe Received:  150mg  Prefilled Syringe >> quantity 4, lot # [id number], exp date 09/2019 75mg  Prefilled Syringe >> quantity 2, lot # [id number], exp date 09/2019 Medication arrival date: 12/30/2018 Received by: TBS

## 2019-01-03 ENCOUNTER — Ambulatory Visit: Payer: Medicare HMO

## 2019-01-28 ENCOUNTER — Other Ambulatory Visit: Payer: Self-pay

## 2019-01-28 ENCOUNTER — Emergency Department (HOSPITAL_COMMUNITY)
Admission: EM | Admit: 2019-01-28 | Discharge: 2019-01-28 | Disposition: A | Discharge status: Home / Self Care | Payer: Medicare HMO | Attending: Emergency Medicine | Admitting: Emergency Medicine

## 2019-01-28 ENCOUNTER — Encounter (HOSPITAL_COMMUNITY): Payer: Self-pay | Admitting: Emergency Medicine

## 2019-01-28 DIAGNOSIS — J449 Chronic obstructive pulmonary disease, unspecified: Secondary | ICD-10-CM | POA: Diagnosis not present

## 2019-01-28 DIAGNOSIS — Z85828 Personal history of other malignant neoplasm of skin: Secondary | ICD-10-CM | POA: Diagnosis not present

## 2019-01-28 DIAGNOSIS — I10 Essential (primary) hypertension: Secondary | ICD-10-CM | POA: Insufficient documentation

## 2019-01-28 DIAGNOSIS — Z79899 Other long term (current) drug therapy: Secondary | ICD-10-CM | POA: Diagnosis not present

## 2019-01-28 DIAGNOSIS — I2510 Atherosclerotic heart disease of native coronary artery without angina pectoris: Secondary | ICD-10-CM | POA: Diagnosis not present

## 2019-01-28 DIAGNOSIS — R600 Localized edema: Secondary | ICD-10-CM | POA: Diagnosis not present

## 2019-01-28 DIAGNOSIS — R002 Palpitations: Secondary | ICD-10-CM

## 2019-01-28 DIAGNOSIS — Z87891 Personal history of nicotine dependence: Secondary | ICD-10-CM | POA: Diagnosis not present

## 2019-01-28 DIAGNOSIS — I4891 Unspecified atrial fibrillation: Secondary | ICD-10-CM

## 2019-01-28 DIAGNOSIS — Z7901 Long term (current) use of anticoagulants: Secondary | ICD-10-CM | POA: Insufficient documentation

## 2019-01-28 LAB — BASIC METABOLIC PANEL
Anion gap: 7 (ref 5–15)
BUN: 18 mg/dL (ref 8–23)
CO2: 26 mmol/L (ref 22–32)
Calcium: 8.9 mg/dL (ref 8.9–10.3)
Chloride: 105 mmol/L (ref 98–111)
Creatinine, Ser: 0.84 mg/dL (ref 0.44–1.00)
GFR calc Af Amer: >60 mL/min (ref >60)
GFR calc non Af Amer: 60 mL/min — ABNORMAL LOW (ref >60)
Glucose, Bld: 113 mg/dL — ABNORMAL HIGH (ref 70–99)
Potassium: 4.6 mmol/L (ref 3.5–5.1)
Sodium: 138 mmol/L (ref 135–145)

## 2019-01-28 LAB — CBC
HCT: 31.7 % — ABNORMAL LOW (ref 36.0–46.0)
Hemoglobin: 10.6 g/dL — ABNORMAL LOW (ref 12.0–15.0)
MCH: 32.8 pg (ref 26.0–34.0)
MCHC: 33.4 g/dL (ref 30.0–36.0)
MCV: 98.1 fL (ref 80.0–100.0)
Platelets: 118 10*3/uL — ABNORMAL LOW (ref 150–400)
RBC: 3.23 MIL/uL — ABNORMAL LOW (ref 3.87–5.11)
RDW: 16.5 % — ABNORMAL HIGH (ref 11.5–15.5)
WBC: 6 10*3/uL (ref 4.0–10.5)
nRBC: 0 % (ref 0.0–0.2)

## 2019-01-28 LAB — PROTIME-INR
INR: 1.6 — ABNORMAL HIGH (ref 0.8–1.2)
Prothrombin Time: 18.6 seconds — ABNORMAL HIGH (ref 11.4–15.2)

## 2019-01-28 LAB — MAGNESIUM: Magnesium: 2.2 mg/dL (ref 1.7–2.4)

## 2019-01-28 LAB — POC OCCULT BLOOD, ED: Fecal Occult Bld: NEGATIVE

## 2019-01-28 MED ORDER — METOPROLOL TARTRATE 5 MG/5ML IV SOLN
5.0000 mg | Freq: Once | INTRAVENOUS | Status: AC
  Administered 2019-01-28: 5 mg via INTRAVENOUS
  Filled 2019-01-28: qty 5

## 2019-01-28 MED ORDER — METOPROLOL TARTRATE 5 MG/5ML IV SOLN
5.0000 mg | Freq: Once | INTRAVENOUS | Status: AC
  Administered 2019-01-28: 09:00:00 5 mg via INTRAVENOUS
  Filled 2019-01-28: qty 5

## 2019-01-28 MED ORDER — DILTIAZEM HCL ER COATED BEADS 120 MG PO CP24: 120.0000 mg | ORAL_CAPSULE | Freq: Once | ORAL | Status: DC

## 2019-01-28 MED ORDER — LORAZEPAM 1 MG PO TABS
0.5000 mg | ORAL_TABLET | Freq: Once | ORAL | Status: AC
  Administered 2019-01-28: 09:00:00 0.5 mg via ORAL
  Filled 2019-01-28: qty 1

## 2019-01-28 MED ORDER — METOPROLOL TARTRATE 25 MG PO TABS: 25.0000 mg | ORAL_TABLET | Freq: Two times a day (BID) | ORAL | Status: DC

## 2019-01-28 MED ORDER — METOPROLOL TARTRATE 37.5 MG PO TABS: 37.5000 mg | ORAL_TABLET | Freq: Two times a day (BID) | ORAL | 0 refills | Status: DC

## 2019-01-28 MED ORDER — DILTIAZEM HCL 30 MG PO TABS
30.0000 mg | ORAL_TABLET | Freq: Once | ORAL | Status: DC
  Filled 2019-01-28: qty 1

## 2019-01-28 MED ORDER — METOPROLOL TARTRATE 25 MG PO TABS
50.0000 mg | ORAL_TABLET | Freq: Two times a day (BID) | ORAL | Status: DC
  Administered 2019-01-28: 50 mg via ORAL
  Filled 2019-01-28: qty 2

## 2019-01-28 NOTE — Discharge Instructions (Addendum)
Please stop taking your Lisinopril and Amlodipine Start Metoprolol 37.5 mg twice a day Please follow up with your doctor Return if worsening

## 2019-01-28 NOTE — ED Triage Notes (Signed)
Pt arrives to ED from home with complaints of waking up this morning with palpitations. EMS reports patient was in Afib with RVR. Patient denies chest pain and shortness of breath. Patient was swabbed negative for COVID last week.

## 2019-01-28 NOTE — Consult Note (Signed)
Patient Demographics  [name], is a 85 y.o. female   MRN: [id number]   DOB - 1923/11/23  Admit Date - 01/28/2019    Outpatient Primary MD for the patient is Julian Hy, PA-C  Consult requested in the Hospital by Pattricia Boss, MD, On 01/28/2019    Reason for consult  Palpitations/A. fib with RVR, to evaluate for need for hospital admission   With History of -  Past Medical History:  Diagnosis Date  . Anxiety   . Arthritis   . CAD (coronary artery disease) 2007   a. 1997 s/p PCI of diagonal.  . Constipation   . COPD/Asthma   . Diverticulosis   . Esophageal reflux   . Family history of adverse reaction to anesthesia    daughter had trouble breathing after surgery, had to be reintubated  . Glaucoma   . HTN (hypertension), benign   . Hyperlipidemia   . Malignant melanoma ([place])    a. 09/2012 s/p resection.  . Neuropathy    lower legs  . PAF (paroxysmal atrial fibrillation) (HCC)    a. intolerant to beta blockers due to asthma and intolerant to CCB due to rash;  b. CHA2DS2VASc = 6-->chronic coumadin;  c. 03/2014 Echo: EF 60-65%, Gr 1 DD, triv AI, mildly dil RV, PASP 53mmhg.  [name] TIA (transient ischemic attack)    a. 02/2011 - chronic coumadin in setting of PAF.  [name] Urinary, incontinence, stress female       Past Surgical History:  Procedure Laterality Date  . ABDOMINAL HYSTERECTOMY    . APPENDECTOMY    . BACK SURGERY     lumbar laminectomy  . CHOLECYSTECTOMY    . COLONOSCOPY    . CORONARY ANGIOPLASTY WITH STENT PLACEMENT    . EYE SURGERY    . NASAL SINUS SURGERY    . VESICOVAGINAL FISTULA CLOSURE W/ TAH    . WEIL OSTEOTOMY Left 06/12/2016   Procedure: Left [place] and [place] Left [name], Weil Osteotomy 2nd and 3rd Metatarsal;  Surgeon: Newt Minion, MD;  Location: [facility];  Service: Orthopedics;  Laterality: Left;    in for   Chief Complaint  Patient presents  with  . Palpitations     HPI  [name]  is a 85 y.o. female, with known history of atrial fibrillation, on warfarin, CAD, hypertension, COPD/asthma, history of TIA, patient presents secondary to complaints of palpitation, reports no history of palpitation in the past, but it was more intense today which prompted her to come to ED, patient was noticed to be in A. fib with RVR, heart rate around 127, patient denies any increased intake of any caffeinated drinks, patient is not on any AV blocking agents, I was called to evaluate for admission, discussed with the patient, if we can control her heart rate on oral regimen, she rather be discharged home, patient was given metoprolol IV 5 mg push in ED, and given 50 mg oral of metoprolol, with good heart rate control, she remains in  A. fib heart rate controlled in the 80s/90s, currently she denies any dyspnea, she is not hypoxic, blood pressure is controlled.    Review of Systems    In addition to the HPI above,  No Fever-chills, No Headache, No changes with Vision or hearing, No problems swallowing food or Liquids, No Chest pain, Cough, she does report some palpitation, mild dyspnea currently resolved No Abdominal pain, No Nausea or Vommitting, Bowel movements are regular, No Blood in stool or Urine, No dysuria, No new skin rashes or bruises, No new joints pains-aches,  No new weakness, tingling, numbness in any extremity, No recent weight gain or loss, No polyuria, polydypsia or polyphagia, No significant Mental Stressors.  A full 10 point Review of Systems was done, except as stated above, all other Review of Systems were negative.   Social History Social History   Tobacco Use  . Smoking status: Former Smoker    Packs/day: 0.20    Years: 25.00    Pack years: 5.00    Types: Cigarettes    Quit date: 07/06/1966    Years since quitting: 52.6  . Smokeless tobacco: Never Used  Substance Use Topics  . Alcohol use: No     Alcohol/week: 0.0 standard drinks     Family History Family History  Problem Relation Age of Onset  . Heart disease Father   . Hemachromatosis Father   . Heart attack Father   . Colon cancer Mother   . Peripheral vascular disease Mother   . Peripheral vascular disease Sister   . Cancer Sister   . Lymphoma Sister   . Heart attack Brother   . Heart disease Brother      Prior to Admission medications   Medication Sig Start Date End Date Taking? Authorizing Provider  acetaminophen (TYLENOL) 325 MG tablet Take 325 mg by mouth every 6 (six) hours as needed for moderate pain.     [provider]  albuterol (PROAIR HFA) 108 (90 Base) MCG/ACT inhaler Inhale 1-2 puffs into the lungs every 4 (four) hours as needed for wheezing or shortness of breath. 12/11/15   Juanito Doom, MD  albuterol (PROVENTIL) (2.5 MG/3ML) 0.083% nebulizer solution Take 3 mLs (2.5 mg total) by nebulization every 6 (six) hours as needed for wheezing or shortness of breath. 07/29/18   Juanito Doom, MD  ALPRAZolam Duanne Moron) 0.25 MG tablet Take 1 tablet (0.25 mg total) by mouth at bedtime as needed for anxiety. Patient taking differently: Take 0.25 mg by mouth 2 (two) times daily as needed for anxiety.  06/15/16   Newt Minion, MD  amLODipine (NORVASC) 10 MG tablet Take 1 tablet (10 mg total) by mouth daily. 05/13/18   Sueanne Margarita, MD  cetirizine (ZYRTEC) 10 MG tablet Take 10 mg by mouth daily as needed for allergies.    [provider]  Cholecalciferol (VITAMIN D) 2000 units tablet Take 2,000 Units by mouth daily.    [provider]  fluticasone (FLONASE) 50 MCG/ACT nasal spray USE 1 SPRAY IN EACH NOSTRIL ONCE A DAY AS NEEDED FOR ALLERGIES OR RHINITIS Patient taking differently: Place 1 spray into both nostrils daily as needed for allergies or rhinitis.  11/17/18   Juanito Doom, MD  fluticasone furoate-vilanterol (BREO ELLIPTA) 200-25 MCG/INH AEPB Inhale 1 puff into the lungs  daily. 01/27/17   Juanito Doom, MD  furosemide (LASIX) 40 MG tablet Take 1 tablet (40 mg total) by mouth daily as needed for fluid. 05/13/18   Turner,  Eber Hong, MD  HYDROcodone-acetaminophen (NORCO) 5-325 MG tablet Take 1 tablet by mouth every 6 (six) hours as needed for severe pain. 04/22/17   Alma Friendly, MD  ipratropium-albuterol (DUONEB) 0.5-2.5 (3) MG/3ML SOLN Take 3 mLs by nebulization every 6 (six) hours as needed (shortness of breath/wheezing). 06/21/18   Martyn Ehrich, NP  latanoprost (XALATAN) 0.005 % ophthalmic solution Place 1 drop into both eyes at bedtime.    [provider]  lisinopril (PRINIVIL,ZESTRIL) 40 MG tablet Take 1 tablet (40 mg total) by mouth daily. 05/13/18 08/11/18  Sueanne Margarita, MD  montelukast (SINGULAIR) 10 MG tablet Take 1 tablet (10 mg total) by mouth at bedtime. 06/22/18   Martyn Ehrich, NP  Multiple Vitamins-Minerals (MULTIVITAMIN & MINERAL PO) Take 1 tablet by mouth daily.    [provider]  pilocarpine (PILOCAR) 1 % ophthalmic solution Place 1 drop into both eyes 3 (three) times daily. 12/30/17   [provider]  senna-docusate (SENOKOT-S) 8.6-50 MG tablet Take 2 tablets by mouth at bedtime as needed for mild constipation. 04/22/17   Alma Friendly, MD  traZODone (DESYREL) 100 MG tablet Take 100 mg by mouth at bedtime as needed for sleep.  03/18/18   [provider]  warfarin (COUMADIN) 2.5 MG tablet Take 1-1.5 tablets (2.5-3.75 mg total) by mouth daily. Take 7.5mg  (3 tablets) for the next 3 days (today, Saturday and Sunday). On Monday check INR and resume your normal dosing as noted below.  Take 3.75 mg (1.5 tablets) on Sun, Tues, Weds, Thurs, Sat & Take 2.5 mg (1 tablet) on Mon & Fri Patient taking differently: Take 5 mg by mouth daily at 6 PM. Checked monthly,  Dose may change accordingly to INR 04/23/17   Alma Friendly, MD    Anti-infectives (From admission, onward)   None       Scheduled Meds: . metoprolol tartrate  50 mg Oral BID  . omalizumab  375 mg Subcutaneous Q14 Days   Continuous Infusions: PRN Meds:.  Allergies  Allergen Reactions  . Alphagan [Brimonidine] Other (See Comments)    Burning sensation  . Apraclonidine Other (See Comments)    Pain, brow pain, tender, not able to tolerate  . Biaxin [Clarithromycin]     Unknown, per pt   . Brovana [Arformoterol] Other (See Comments)    Makes my heart race  . Budesonide Other (See Comments)    Makes my heart race  . Cefdinir     "FEEL HORRIBLE"  . Cephalexin Itching  . Cephalexin Nausea Only  . Cifenline Itching  . Clonidine Other (See Comments)    unknown  . Clonidine Derivatives Other (See Comments)    REACTION: unknown  . Dorzolamide Other (See Comments)    Redness  . Iohexol Other (See Comments)     Desc: NOTES FROM PRIOR CT STATES PRE MEDICATION PRIOR TO OMNIPAQUE ENHANCED CT   . Nsaids Other (See Comments)    Unknown  . Penicillins Itching, Swelling and Other (See Comments)    Swelling and edema of ears and rash Swelling to ears  Has patient had a PCN reaction causing immediate rash, facial/tongue/throat swelling, SOB or lightheadedness with hypotension: {no Has patient had a PCN reaction causing severe rash involving mucus membranes or skin necrosis: {no Has patient had a PCN reaction that required hospitalization no Has patient had a PCN reaction occurring within the last 10 years: no If all of the above answers are "NO", then may proceed with Cephalosporin use.  [name]  Sulfamethoxazole Other (See Comments)    Unknown  . Sulfonamide Derivatives Itching  . Tetracycline Itching and Other (See Comments)    unknown  . Timolol Other (See Comments)    Eye red and irritated   . Vancomycin Other (See Comments)    Turn red  . Zetia [Ezetimibe] Other (See Comments)    Unknown  . Cardizem [Diltiazem Hcl] Rash    Physical Exam  Vitals  Blood pressure (!) 114/57, pulse 85, temperature  98.1 F (36.7 C), temperature source Oral, resp. rate (!) 21, height 5\' 6"  (1.676 m), weight 56.7 kg, SpO2 99 %.   1. General well-developed, frail elderly female, laying in bed in no apparent distress  2. Normal affect and insight, Not Suicidal or Homicidal, Awake Alert, Oriented X 3.  3. No F.N deficits, ALL C.Nerves Intact, Strength 5/5 all 4 extremities, Sensation intact all 4 extremities, Plantars down going.  4. Ears and Eyes appear Normal, Conjunctivae clear, PERRLA. Moist Oral Mucosa.  5. Supple Neck, No JVD, No cervical lymphadenopathy appriciated, No Carotid Bruits.  6. Symmetrical Chest wall movement, Good air movement bilaterally, CTAB.  7.  Irregular irregular, No Gallops, Rubs or Murmurs, No Parasternal Heave.  8. Positive Bowel Sounds, Abdomen Soft, No tenderness, No organomegaly appriciated,No rebound -guarding or rigidity.  9.  No Cyanosis, Normal Skin Turgor, No Skin Rash or Bruise.  10. Good muscle tone,  joints appear normal , no effusions, Normal ROM.  11. No Palpable Lymph Nodes in Neck or Axillae   Data Review  CBC Recent Labs  Lab 01/28/19 0920  WBC 6.0  HGB 10.6*  HCT 31.7*  PLT 118*  MCV 98.1  MCH 32.8  MCHC 33.4  RDW 16.5*   ------------------------------------------------------------------------------------------------------------------  Chemistries  Recent Labs  Lab 01/28/19 0920  NA 138  K 4.6  CL 105  CO2 26  GLUCOSE 113*  BUN 18  CREATININE 0.84  CALCIUM 8.9  MG 2.2   ------------------------------------------------------------------------------------------------------------------ estimated creatinine clearance is 37.5 mL/min (by C-G formula based on SCr of 0.84 mg/dL). ------------------------------------------------------------------------------------------------------------------ No results for input(s): TSH, T4TOTAL, T3FREE, THYROIDAB in the last 72 hours.  Invalid input(s): FREET3   Coagulation profile Recent Labs   Lab 01/28/19 0920  INR 1.6*   ------------------------------------------------------------------------------------------------------------------- No results for input(s): DDIMER in the last 72 hours. -------------------------------------------------------------------------------------------------------------------  Cardiac Enzymes No results for input(s): CKMB, TROPONINI, MYOGLOBIN in the last 168 hours.  Invalid input(s): CK ------------------------------------------------------------------------------------------------------------------ Invalid input(s): POCBNP   ---------------------------------------------------------------------------------------------------------------  Urinalysis    Component Value Date/Time   COLORURINE YELLOW 04/05/2018 1344   APPEARANCEUR CLEAR 04/05/2018 1344   LABSPEC 1.009 04/05/2018 1344   PHURINE 6.0 04/05/2018 1344   GLUCOSEU NEGATIVE 04/05/2018 1344   HGBUR NEGATIVE 04/05/2018 1344   BILIRUBINUR NEGATIVE 04/05/2018 1344   KETONESUR NEGATIVE 04/05/2018 1344   PROTEINUR NEGATIVE 04/05/2018 1344   UROBILINOGEN 0.2 03/05/2015 1600   NITRITE NEGATIVE 04/05/2018 1344   LEUKOCYTESUR NEGATIVE 04/05/2018 1344     Imaging results:   No results found.  My personal review of EKG: Rhythm NSR, Rate  108/min, QTc 444 , no Acute ST changes    Assessment & Plan  Active Problems:   Palpitation   Atrial fibrillation with RVR (HCC)   Palpitation/A. fib with RVR -Patient presents this morning with palpitation, she was noted to have A. fib with RVR, heart rate late 120s-130s, she is not on any AV nodal blocking agents, reports some dyspnea, denies any chest pain, denies any increase caffeinated drinks recently, potassium  4.6, magnesium 2.2, she is with diltiazem allergy, so she was given 5 mg of IV metoprolol push, started metoprolol 50 mg oral once, heart rate significantly controlled, it is currently in the 80s/90s, remains in A. Fib, patient  reports she is feeling well, was offered overnight admission versus discharge, she would rather go home which I think is very appropriate. Recommendation: -Patient can be discharged home on metoprolol 37.5 mg p.o. twice daily(metoprolol is beta-1 selective, rather than Coreg which is nonselective in the setting of her known asthma), and I recommend to stop both lisinopril and amlodipine on discharge from ED and given her control blood pressure, as I do think metoprolol will have good control on her hypertension.  -Continue anticoagulation with warfarin     Thank you for the consult,   Phillips Climes M.D on 01/28/2019 at 2:55 PM  Between 7am to 7pm - Pager - [phone]  After 7pm go to [email or web address] - password [email or web address]   Thank you for the consult, we will follow the patient with you in the [facility]   Office  [phone]

## 2019-01-28 NOTE — ED Provider Notes (Addendum)
[age]-year-old female presents today with palpitations and some dyspnea.  She has a known history of atrial fibrillation.  She reports being followed by Dr. Radford Pax.  Review of notes reveal last note from Dr. Radford Pax in system on 05/13/2018 reports that she is on chronic anticoagulation with Coumadin.  Hgb with 2 gram drop and hr increased although bp remains stable  Pattricia Boss, MD 01/28/19 1536    Pattricia Boss, MD 01/28/19 1539

## 2019-01-28 NOTE — ED Provider Notes (Signed)
[facility] EMERGENCY DEPARTMENT Provider Note   CSN: [id number] Arrival date & time: 01/28/19  0827     History   Chief Complaint Chief Complaint  Patient presents with  . Palpitations    HPI [name] is a 85 y.o. female who presents with palpitations. She arrives via EMS. Past medical history significant for A. fib on warfarin, coronary artery disease, hypertension, COPD/asthma, history of TIA.  The patient states that she has been having palpitations on and off for some time however they are usually self-limiting.  Last night she took a Xanax before she went to bed to help her sleep and for "nerves".  She got up in the middle of the night and felt like her heart was racing.  She knew she was in A. Fib because she has felt similar symptoms in the past.  States she is not in A. fib all the time and she does not take any rate control medicines because she cannot tolerate them.  Otherwise she reports being in her usual state of health.  She denies any recent fever, chills, dizziness, loss of consciousness, headache, chest pain, shortness of breath, abdominal pain.  She has some chronic ankle swelling bilaterally which is not worse than normal.  She has been eating and drinking well.  She reports normal urination and normal bowels.  She has been taking all of her medicine as prescribed. She lives alone with her dog.     HPI  Past Medical History:  Diagnosis Date  . Anxiety   . Arthritis   . CAD (coronary artery disease) 2007   a. 1997 s/p PCI of diagonal.  . Constipation   . COPD/Asthma   . Diverticulosis   . Esophageal reflux   . Family history of adverse reaction to anesthesia    daughter had trouble breathing after surgery, had to be reintubated  . Glaucoma   . HTN (hypertension), benign   . Hyperlipidemia   . Malignant melanoma ([place])    a. 09/2012 s/p resection.  . Neuropathy    lower legs  . PAF (paroxysmal atrial fibrillation) (HCC)    a.  intolerant to beta blockers due to asthma and intolerant to CCB due to rash;  b. CHA2DS2VASc = 6-->chronic coumadin;  c. 03/2014 Echo: EF 60-65%, Gr 1 DD, triv AI, mildly dil RV, PASP 92mmhg.  [name] TIA (transient ischemic attack)    a. 02/2011 - chronic coumadin in setting of PAF.  [name] Urinary, incontinence, stress female     Patient Active Problem List   Diagnosis Date Noted  . On continuous oral anticoagulation   . Hip fracture ([place]) 04/19/2017  . Closed left hip fracture, initial encounter ([place]) 04/18/2017  . Ulcer of toe of left foot, with necrosis of bone ([place]) 10/08/2016  . Non-pressure chronic ulcer of other part of left foot limited to breakdown of skin ([place]) 09/24/2016  . Acute asthma exacerbation 09/21/2016  . Allergic rhinitis 09/21/2016  . Anxiety 09/21/2016  . Arthritis 09/21/2016  . Breath shortness 09/21/2016  . Glaucoma 09/21/2016  . Adult hypothyroidism 09/21/2016  . Unilateral primary osteoarthritis, right knee 08/27/2016  . Bunion of great toe of right foot 06/12/2016  . Acquired hammer toe of left foot   . Irregular bowel habits 10/25/2014  . Cerumen impaction 09/26/2014  . Essential hypertension 03/22/2014  . Wrist laceration 03/01/2014  . Primary open angle glaucoma of left eye, moderate stage 11/16/2013  . Primary open angle glaucoma of right eye,  mild stage 11/16/2013  . Persistent atrial fibrillation ([place])   . Hyperlipidemia LDL goal <70   . Long term current use of anticoagulant therapy 09/24/2012  . CAD (coronary artery disease) 09/24/2012  . Transient cerebral ischemia 03/05/2011  . WEIGHT LOSS 08/23/2009  . CHANGE IN BOWELS 08/23/2009  . COLONIC POLYPS, ADENOMATOUS, HX OF 08/23/2009  . Anxiety state 07/02/2009  . Allergic rhinitis due to pollen 11/13/2008  . Major depressive disorder, single episode, moderate ([place]) 05/14/2008  . Sinus infection 07/13/2007  . Airway hyperreactivity 06/15/2007    Past Surgical History:  Procedure Laterality Date  .  ABDOMINAL HYSTERECTOMY    . APPENDECTOMY    . BACK SURGERY     lumbar laminectomy  . CHOLECYSTECTOMY    . COLONOSCOPY    . CORONARY ANGIOPLASTY WITH STENT PLACEMENT    . EYE SURGERY    . NASAL SINUS SURGERY    . VESICOVAGINAL FISTULA CLOSURE W/ TAH    . WEIL OSTEOTOMY Left 06/12/2016   Procedure: Left [place] and [place] Left [name], Weil Osteotomy 2nd and 3rd Metatarsal;  Surgeon: Newt Minion, MD;  Location: [facility];  Service: Orthopedics;  Laterality: Left;     OB History   No obstetric history on file.      Home Medications    Prior to Admission medications   Medication Sig Start Date End Date Taking? Authorizing Provider  acetaminophen (TYLENOL) 325 MG tablet Take 325 mg by mouth every 6 (six) hours as needed for moderate pain.     [provider]  albuterol (PROAIR HFA) 108 (90 Base) MCG/ACT inhaler Inhale 1-2 puffs into the lungs every 4 (four) hours as needed for wheezing or shortness of breath. 12/11/15   Juanito Doom, MD  albuterol (PROVENTIL) (2.5 MG/3ML) 0.083% nebulizer solution Take 3 mLs (2.5 mg total) by nebulization every 6 (six) hours as needed for wheezing or shortness of breath. 07/29/18   Juanito Doom, MD  ALPRAZolam Duanne Moron) 0.25 MG tablet Take 1 tablet (0.25 mg total) by mouth at bedtime as needed for anxiety. Patient taking differently: Take 0.25 mg by mouth 2 (two) times daily as needed for anxiety.  06/15/16   Newt Minion, MD  amLODipine (NORVASC) 10 MG tablet Take 1 tablet (10 mg total) by mouth daily. 05/13/18   Sueanne Margarita, MD  cetirizine (ZYRTEC) 10 MG tablet Take 10 mg by mouth daily as needed for allergies.    [provider]  Cholecalciferol (VITAMIN D) 2000 units tablet Take 2,000 Units by mouth daily.    [provider]  fluticasone (FLONASE) 50 MCG/ACT nasal spray USE 1 SPRAY IN EACH NOSTRIL ONCE A DAY AS NEEDED FOR ALLERGIES OR RHINITIS 11/17/18   Juanito Doom, MD  fluticasone furoate-vilanterol (BREO  ELLIPTA) 200-25 MCG/INH AEPB Inhale 1 puff into the lungs daily. 01/27/17   Juanito Doom, MD  furosemide (LASIX) 40 MG tablet Take 1 tablet (40 mg total) by mouth daily as needed for fluid. 05/13/18   Sueanne Margarita, MD  HYDROcodone-acetaminophen (NORCO) 5-325 MG tablet Take 1 tablet by mouth every 6 (six) hours as needed for severe pain. 04/22/17   Alma Friendly, MD  ipratropium-albuterol (DUONEB) 0.5-2.5 (3) MG/3ML SOLN Take 3 mLs by nebulization every 6 (six) hours as needed (shortness of breath/wheezing). 06/21/18   Martyn Ehrich, NP  latanoprost (XALATAN) 0.005 % ophthalmic solution Place 1 drop into both eyes at bedtime.    [provider]  lisinopril (  PRINIVIL,ZESTRIL) 40 MG tablet Take 1 tablet (40 mg total) by mouth daily. 05/13/18 08/11/18  Sueanne Margarita, MD  montelukast (SINGULAIR) 10 MG tablet Take 1 tablet (10 mg total) by mouth at bedtime. 06/22/18   Martyn Ehrich, NP  Multiple Vitamins-Minerals (MULTIVITAMIN & MINERAL PO) Take 1 tablet by mouth daily.    [provider]  pilocarpine (PILOCAR) 1 % ophthalmic solution Place 1 drop into both eyes 3 (three) times daily. 12/30/17   [provider]  senna-docusate (SENOKOT-S) 8.6-50 MG tablet Take 2 tablets by mouth at bedtime as needed for mild constipation. 04/22/17   Alma Friendly, MD  traZODone (DESYREL) 100 MG tablet Take 100 mg by mouth at bedtime as needed for sleep.  03/18/18   [provider]  warfarin (COUMADIN) 2.5 MG tablet Take 1-1.5 tablets (2.5-3.75 mg total) by mouth daily. Take 7.5mg  (3 tablets) for the next 3 days (today, Saturday and Sunday). On Monday check INR and resume your normal dosing as noted below.  Take 3.75 mg (1.5 tablets) on Sun, Tues, Weds, Thurs, Sat & Take 2.5 mg (1 tablet) on Mon & Fri Patient taking differently: Take 5 mg by mouth daily at 6 PM. Checked monthly,  Dose may change accordingly to INR 04/23/17   Alma Friendly, MD    Family  History Family History  Problem Relation Age of Onset  . Heart disease Father   . Hemachromatosis Father   . Heart attack Father   . Colon cancer Mother   . Peripheral vascular disease Mother   . Peripheral vascular disease Sister   . Cancer Sister   . Lymphoma Sister   . Heart attack Brother   . Heart disease Brother     Social History Social History   Tobacco Use  . Smoking status: Former Smoker    Packs/day: 0.20    Years: 25.00    Pack years: 5.00    Types: Cigarettes    Quit date: 07/06/1966    Years since quitting: 52.6  . Smokeless tobacco: Never Used  Substance Use Topics  . Alcohol use: No    Alcohol/week: 0.0 standard drinks  . Drug use: No     Allergies   Alphagan [brimonidine], Apraclonidine, Biaxin [clarithromycin], Brovana [arformoterol], Budesonide, Cefdinir, Cephalexin, Cephalexin, Cifenline, Clonidine, Clonidine derivatives, Dorzolamide, Iohexol, Nsaids, Penicillins, Sulfamethoxazole, Sulfonamide derivatives, Tetracycline, Timolol, Vancomycin, Zetia [ezetimibe], and Cardizem [diltiazem hcl]   Review of Systems Review of Systems  Constitutional: Negative for chills and fever.  Respiratory: Negative for cough and shortness of breath.   Cardiovascular: Positive for palpitations and leg swelling. Negative for chest pain.  Gastrointestinal: Negative for abdominal pain and constipation.  Genitourinary: Negative for dysuria.  Neurological: Negative for dizziness.  Psychiatric/Behavioral: The patient is nervous/anxious.   All other systems reviewed and are negative.    Physical Exam Updated Vital Signs BP 127/80   Pulse (!) 110   Temp 98.1 F (36.7 C) (Oral)   Resp 20   Ht 5\' 6"  (1.676 m)   Wt 56.7 kg   SpO2 97%   BMI 20.18 kg/m   Physical Exam Vitals signs and nursing note reviewed.  Constitutional:      General: She is not in acute distress.    Appearance: Normal appearance. She is well-developed. She is not ill-appearing.     Comments:  Elderly female in NAD  HENT:     Head: Normocephalic and atraumatic.  Eyes:     General: No scleral icterus.  Right eye: No discharge.        Left eye: No discharge.     Conjunctiva/sclera: Conjunctivae normal.     Pupils: Pupils are equal, round, and reactive to light.  Neck:     Musculoskeletal: Normal range of motion.  Cardiovascular:     Rate and Rhythm: Tachycardia present. Rhythm irregularly irregular.  Pulmonary:     Effort: Pulmonary effort is normal. No respiratory distress.     Breath sounds: Normal breath sounds.  Abdominal:     General: There is no distension.     Palpations: Abdomen is soft.     Tenderness: There is no abdominal tenderness.  Genitourinary:    Comments: Rectal: No gross blood, hemorrhoids, fissures, redness, area of fluctuance, lesions, or tenderness. Chaperone present during exam.  Musculoskeletal:     Right lower leg: Edema present.     Left lower leg: Edema present.     Comments: Bilateral ankle edema  Skin:    General: Skin is warm and dry.  Neurological:     Mental Status: She is alert and oriented to person, place, and time.  Psychiatric:        Behavior: Behavior normal.      ED Treatments / Results  Labs (all labs ordered are listed, but only abnormal results are displayed) Labs Reviewed  BASIC METABOLIC PANEL - Abnormal; Notable for the following components:      Result Value   Glucose, Bld 113 (*)    GFR calc non Af Amer 60 (*)    All other components within normal limits  CBC - Abnormal; Notable for the following components:   RBC 3.23 (*)    Hemoglobin 10.6 (*)    HCT 31.7 (*)    RDW 16.5 (*)    Platelets 118 (*)    All other components within normal limits  PROTIME-INR - Abnormal; Notable for the following components:   Prothrombin Time 18.6 (*)    INR 1.6 (*)    All other components within normal limits  SARS CORONAVIRUS 2 (HOSPITAL ORDER, [facility])  MAGNESIUM  POC OCCULT BLOOD, ED     EKG EKG Interpretation  Date/Time:  Saturday January 28 2019 08:35:05 EDT Ventricular Rate:  127 PR Interval:    QRS Duration: 97 QT Interval:  294 QTC Calculation: 428 R Axis:   82 Text Interpretation:  Atrial flutter Ventricular premature complex Borderline right axis deviation Low voltage, extremity leads ST depression, probably rate related Confirmed by Pattricia Boss [phone]) on 01/28/2019 9:21:37 AM   Radiology No results found.  Procedures Procedures (including critical care time)  Medications Ordered in ED Medications  metoprolol tartrate (LOPRESSOR) tablet 50 mg (50 mg Oral Given 01/28/19 1353)  metoprolol tartrate (LOPRESSOR) injection 5 mg (5 mg Intravenous Given 01/28/19 0929)  LORazepam (ATIVAN) tablet 0.5 mg (0.5 mg Oral Given 01/28/19 0928)  metoprolol tartrate (LOPRESSOR) injection 5 mg (5 mg Intravenous Given 01/28/19 1354)     Initial Impression / Assessment and Plan / ED Course  I have reviewed the triage vital signs and the nursing notes.  Pertinent labs & imaging results that were available during my care of the patient were reviewed by me and considered in my medical decision making (see chart for details).  85 year old female presents with palpitations. She is in A.fib with RVR. She is otherwise asymptomatic - just complaining of anxiety which is an ongoing issue for her. She doesn't have any pain. HR is fast  and irregularly irregular. Lungs are CTA and abdomen is soft, non-tender. EKG shows rate is 127. Will order labs, INR. Will give Metoprolol IV  CBC is remarkable for drop in hgb from 12.1 in Dec 2019 to 10.6. BMP is normal. Mag is normal. INR is subtherapeutic. Hemoccult is negative.   HR has improved but is still 115. BP is soft. Shared visit with Dr. Jeanell Sparrow. Discussed with Dr. Chales Salmon with [facility] who will come to see pt.  Dr. Chales Salmon has seen pt. He does not think she needs to be admitted. He gave her another IV dose of Metoprolol and a PO dose. Her  HR is controlled now and she feels fine. He wants to stop her Lisinopril and Amlodipine and start her on Metoprolol 37.5 mg BID. This was discussed with her granddaughter [name] who verbalized understanding.  Final Clinical Impressions(s) / ED Diagnoses   Final diagnoses:  Atrial fibrillation with RVR [facility])  Palpitations    ED Discharge Orders    None       Recardo Evangelist, PA-C 01/28/19 1557    Pattricia Boss, MD 01/29/19 1539

## 2019-01-28 NOTE — ED Notes (Signed)
Updated patients granddaughter on patients care. Gave patient a sandwich.

## 2019-01-28 NOTE — ED Notes (Signed)
Patient and patients granddaughter, [name],  verbalized understanding of discharge instructions. Opportunity for questioning and answers were provided to both. Armband removed by staff, pt discharged from ED with granddaughter.

## 2019-01-30 ENCOUNTER — Telehealth (HOSPITAL_COMMUNITY): Payer: Self-pay | Admitting: *Deleted

## 2019-01-30 NOTE — Telephone Encounter (Signed)
I cld to follow up with pt regarding recent Ed visit.  Per pt grand-daughter she is doing well and would prefer to follow up with Dr. Radford Pax, her primary Cards.  She will also follow up with her PCP for some recent diarrhea.

## 2019-02-20 ENCOUNTER — Other Ambulatory Visit: Payer: Self-pay

## 2019-02-20 ENCOUNTER — Encounter (HOSPITAL_COMMUNITY): Payer: Self-pay

## 2019-02-20 ENCOUNTER — Emergency Department (HOSPITAL_COMMUNITY): Payer: Medicare HMO

## 2019-02-20 ENCOUNTER — Emergency Department (HOSPITAL_COMMUNITY)
Admission: EM | Admit: 2019-02-20 | Discharge: 2019-02-20 | Disposition: A | Discharge status: Home / Self Care | Payer: Medicare HMO | Attending: Emergency Medicine | Admitting: Emergency Medicine

## 2019-02-20 DIAGNOSIS — J449 Chronic obstructive pulmonary disease, unspecified: Secondary | ICD-10-CM | POA: Diagnosis not present

## 2019-02-20 DIAGNOSIS — Z79899 Other long term (current) drug therapy: Secondary | ICD-10-CM | POA: Insufficient documentation

## 2019-02-20 DIAGNOSIS — I4891 Unspecified atrial fibrillation: Secondary | ICD-10-CM | POA: Diagnosis not present

## 2019-02-20 DIAGNOSIS — Z7901 Long term (current) use of anticoagulants: Secondary | ICD-10-CM | POA: Diagnosis not present

## 2019-02-20 DIAGNOSIS — J45909 Unspecified asthma, uncomplicated: Secondary | ICD-10-CM | POA: Insufficient documentation

## 2019-02-20 DIAGNOSIS — Z8673 Personal history of transient ischemic attack (TIA), and cerebral infarction without residual deficits: Secondary | ICD-10-CM | POA: Insufficient documentation

## 2019-02-20 DIAGNOSIS — Z87891 Personal history of nicotine dependence: Secondary | ICD-10-CM | POA: Diagnosis not present

## 2019-02-20 DIAGNOSIS — E039 Hypothyroidism, unspecified: Secondary | ICD-10-CM | POA: Insufficient documentation

## 2019-02-20 DIAGNOSIS — I2510 Atherosclerotic heart disease of native coronary artery without angina pectoris: Secondary | ICD-10-CM | POA: Insufficient documentation

## 2019-02-20 DIAGNOSIS — R0602 Shortness of breath: Secondary | ICD-10-CM | POA: Diagnosis present

## 2019-02-20 DIAGNOSIS — I10 Essential (primary) hypertension: Secondary | ICD-10-CM | POA: Insufficient documentation

## 2019-02-20 LAB — CBC
HCT: 32.5 % — ABNORMAL LOW (ref 36.0–46.0)
Hemoglobin: 10.4 g/dL — ABNORMAL LOW (ref 12.0–15.0)
MCH: 32.7 pg (ref 26.0–34.0)
MCHC: 32 g/dL (ref 30.0–36.0)
MCV: 102.2 fL — ABNORMAL HIGH (ref 80.0–100.0)
Platelets: 135 10*3/uL — ABNORMAL LOW (ref 150–400)
RBC: 3.18 MIL/uL — ABNORMAL LOW (ref 3.87–5.11)
RDW: 16.9 % — ABNORMAL HIGH (ref 11.5–15.5)
WBC: 6.1 10*3/uL (ref 4.0–10.5)
nRBC: 0 % (ref 0.0–0.2)

## 2019-02-20 LAB — BASIC METABOLIC PANEL
Anion gap: 8 (ref 5–15)
BUN: 16 mg/dL (ref 8–23)
CO2: 28 mmol/L (ref 22–32)
Calcium: 8.7 mg/dL — ABNORMAL LOW (ref 8.9–10.3)
Chloride: 102 mmol/L (ref 98–111)
Creatinine, Ser: 0.74 mg/dL (ref 0.44–1.00)
GFR calc Af Amer: >60 mL/min (ref >60)
GFR calc non Af Amer: >60 mL/min (ref >60)
Glucose, Bld: 115 mg/dL — ABNORMAL HIGH (ref 70–99)
Potassium: 4.5 mmol/L (ref 3.5–5.1)
Sodium: 138 mmol/L (ref 135–145)

## 2019-02-20 LAB — BRAIN NATRIURETIC PEPTIDE: B Natriuretic Peptide: 195.4 pg/mL — ABNORMAL HIGH (ref 0.0–100.0)

## 2019-02-20 LAB — TROPONIN I (HIGH SENSITIVITY): Troponin I (High Sensitivity): 4 ng/L (ref <18)

## 2019-02-20 MED ORDER — METOPROLOL TARTRATE 25 MG PO TABS: 25.0000 mg | ORAL_TABLET | Freq: Two times a day (BID) | ORAL | 0 refills | Status: DC

## 2019-02-20 MED ORDER — SODIUM CHLORIDE 0.9% FLUSH: 3.0000 mL | Freq: Once | INTRAVENOUS | Status: DC

## 2019-02-20 NOTE — ED Triage Notes (Signed)
Pt sent from [facility]. They are concerned for uncontrolled. a fib, as well as bruising. Pt states that she is feeling her normal.

## 2019-02-20 NOTE — ED Provider Notes (Signed)
[facility] DEPT Provider Note   CSN: [id number] Arrival date & time: 02/20/19  1433     History   Chief Complaint Chief Complaint  Patient presents with  . Atrial Fibrillation    HPI [name] is a 85 y.o. female.     Patient is a 85 year old female with past medical history of coronary artery disease with stent, chronic atrial fibrillation, TIA, COPD.  Patient is on Coumadin.  She presents today for evaluation of elevated heart rate.  Patient went today to her primary doctor's office for an INR check.  While she was there, she was found to have the elevated heart rate and was sent here by her primary care provider for further evaluation.  Her provider feels as though she needs inpatient treatment.  Patient denies to me she is experiencing any chest pain or difficulty breathing.  She denies any palpitations.  She does state that she becomes somewhat short of breath with exertion, but she believes that this is related to her advanced age.  The history is provided by the patient.  Atrial Fibrillation This is a recurrent problem. The problem occurs constantly. The problem has not changed since onset.Pertinent negatives include no chest pain and no shortness of breath. Nothing aggravates the symptoms. Nothing relieves the symptoms. She has tried nothing for the symptoms.    Past Medical History:  Diagnosis Date  . Anxiety   . Arthritis   . CAD (coronary artery disease) 2007   a. 1997 s/p PCI of diagonal.  . Constipation   . COPD/Asthma   . Diverticulosis   . Esophageal reflux   . Family history of adverse reaction to anesthesia    daughter had trouble breathing after surgery, had to be reintubated  . Glaucoma   . HTN (hypertension), benign   . Hyperlipidemia   . Malignant melanoma ([place])    a. 09/2012 s/p resection.  . Neuropathy    lower legs  . PAF (paroxysmal atrial fibrillation) (HCC)    a. intolerant to beta blockers due to  asthma and intolerant to CCB due to rash;  b. CHA2DS2VASc = 6-->chronic coumadin;  c. 03/2014 Echo: EF 60-65%, Gr 1 DD, triv AI, mildly dil RV, PASP 40mmhg.  [name] TIA (transient ischemic attack)    a. 02/2011 - chronic coumadin in setting of PAF.  [name] Urinary, incontinence, stress female     Patient Active Problem List   Diagnosis Date Noted  . Palpitation 01/28/2019  . Atrial fibrillation with RVR ([place]) 01/28/2019  . On continuous oral anticoagulation   . Hip fracture ([place]) 04/19/2017  . Closed left hip fracture, initial encounter ([place]) 04/18/2017  . Ulcer of toe of left foot, with necrosis of bone ([place]) 10/08/2016  . Non-pressure chronic ulcer of other part of left foot limited to breakdown of skin ([place]) 09/24/2016  . Acute asthma exacerbation 09/21/2016  . Allergic rhinitis 09/21/2016  . Anxiety 09/21/2016  . Arthritis 09/21/2016  . Breath shortness 09/21/2016  . Glaucoma 09/21/2016  . Adult hypothyroidism 09/21/2016  . Unilateral primary osteoarthritis, right knee 08/27/2016  . Bunion of great toe of right foot 06/12/2016  . Acquired hammer toe of left foot   . Irregular bowel habits 10/25/2014  . Cerumen impaction 09/26/2014  . Essential hypertension 03/22/2014  . Wrist laceration 03/01/2014  . Primary open angle glaucoma of left eye, moderate stage 11/16/2013  . Primary open angle glaucoma of right eye, mild stage 11/16/2013  . Persistent atrial fibrillation ([place])   .  Hyperlipidemia LDL goal <70   . Long term current use of anticoagulant therapy 09/24/2012  . CAD (coronary artery disease) 09/24/2012  . Transient cerebral ischemia 03/05/2011  . WEIGHT LOSS 08/23/2009  . CHANGE IN BOWELS 08/23/2009  . COLONIC POLYPS, ADENOMATOUS, HX OF 08/23/2009  . Anxiety state 07/02/2009  . Allergic rhinitis due to pollen 11/13/2008  . Major depressive disorder, single episode, moderate ([place]) 05/14/2008  . Sinus infection 07/13/2007  . Airway hyperreactivity 06/15/2007    Past Surgical  History:  Procedure Laterality Date  . ABDOMINAL HYSTERECTOMY    . APPENDECTOMY    . BACK SURGERY     lumbar laminectomy  . CHOLECYSTECTOMY    . COLONOSCOPY    . CORONARY ANGIOPLASTY WITH STENT PLACEMENT    . EYE SURGERY    . NASAL SINUS SURGERY    . VESICOVAGINAL FISTULA CLOSURE W/ TAH    . WEIL OSTEOTOMY Left 06/12/2016   Procedure: Left Chevron and Aiken Left [name], Weil Osteotomy 2nd and 3rd Metatarsal;  Surgeon: Newt Minion, MD;  Location: [facility];  Service: Orthopedics;  Laterality: Left;     OB History   No obstetric history on file.      Home Medications    Prior to Admission medications   Medication Sig Start Date End Date Taking? Authorizing Provider  acetaminophen (TYLENOL) 325 MG tablet Take 325 mg by mouth every 6 (six) hours as needed for moderate pain.     [provider]  albuterol (PROAIR HFA) 108 (90 Base) MCG/ACT inhaler Inhale 1-2 puffs into the lungs every 4 (four) hours as needed for wheezing or shortness of breath. 12/11/15   Juanito Doom, MD  albuterol (PROVENTIL) (2.5 MG/3ML) 0.083% nebulizer solution Take 3 mLs (2.5 mg total) by nebulization every 6 (six) hours as needed for wheezing or shortness of breath. 07/29/18   Juanito Doom, MD  ALPRAZolam Duanne Moron) 0.25 MG tablet Take 1 tablet (0.25 mg total) by mouth at bedtime as needed for anxiety. Patient taking differently: Take 0.25 mg by mouth 2 (two) times daily as needed for anxiety.  06/15/16   Newt Minion, MD  amLODipine (NORVASC) 10 MG tablet Take 1 tablet (10 mg total) by mouth daily. 05/13/18   Sueanne Margarita, MD  cetirizine (ZYRTEC) 10 MG tablet Take 10 mg by mouth daily as needed for allergies.    [provider]  Cholecalciferol (VITAMIN D) 2000 units tablet Take 2,000 Units by mouth daily.    [provider]  fluticasone (FLONASE) 50 MCG/ACT nasal spray USE 1 SPRAY IN EACH NOSTRIL ONCE A DAY AS NEEDED FOR ALLERGIES OR RHINITIS Patient taking differently:  Place 1 spray into both nostrils daily as needed for allergies or rhinitis.  11/17/18   Juanito Doom, MD  fluticasone furoate-vilanterol (BREO ELLIPTA) 200-25 MCG/INH AEPB Inhale 1 puff into the lungs daily. 01/27/17   Juanito Doom, MD  furosemide (LASIX) 40 MG tablet Take 1 tablet (40 mg total) by mouth daily as needed for fluid. 05/13/18   Sueanne Margarita, MD  HYDROcodone-acetaminophen (NORCO) 5-325 MG tablet Take 1 tablet by mouth every 6 (six) hours as needed for severe pain. 04/22/17   Alma Friendly, MD  ipratropium-albuterol (DUONEB) 0.5-2.5 (3) MG/3ML SOLN Take 3 mLs by nebulization every 6 (six) hours as needed (shortness of breath/wheezing). 06/21/18   Martyn Ehrich, NP  latanoprost (XALATAN) 0.005 % ophthalmic solution Place 1 drop into both eyes at bedtime.  [provider]  lisinopril (PRINIVIL,ZESTRIL) 40 MG tablet Take 1 tablet (40 mg total) by mouth daily. 05/13/18 08/11/18  Sueanne Margarita, MD  metoprolol tartrate 37.5 MG TABS Take 37.5 mg by mouth 2 (two) times daily. 01/28/19   Recardo Evangelist, PA-C  montelukast (SINGULAIR) 10 MG tablet Take 1 tablet (10 mg total) by mouth at bedtime. 06/22/18   Martyn Ehrich, NP  Multiple Vitamins-Minerals (MULTIVITAMIN & MINERAL PO) Take 1 tablet by mouth daily.    [provider]  pilocarpine (PILOCAR) 1 % ophthalmic solution Place 1 drop into both eyes 3 (three) times daily. 12/30/17   [provider]  senna-docusate (SENOKOT-S) 8.6-50 MG tablet Take 2 tablets by mouth at bedtime as needed for mild constipation. 04/22/17   Alma Friendly, MD  traZODone (DESYREL) 100 MG tablet Take 100 mg by mouth at bedtime as needed for sleep.  03/18/18   [provider]  warfarin (COUMADIN) 2.5 MG tablet Take 1-1.5 tablets (2.5-3.75 mg total) by mouth daily. Take 7.5mg  (3 tablets) for the next 3 days (today, Saturday and Sunday). On Monday check INR and resume your normal dosing as noted below.   Take 3.75 mg (1.5 tablets) on Sun, Tues, Weds, Thurs, Sat & Take 2.5 mg (1 tablet) on Mon & Fri Patient taking differently: Take 5 mg by mouth daily at 6 PM. Checked monthly,  Dose may change accordingly to INR 04/23/17   Alma Friendly, MD    Family History Family History  Problem Relation Age of Onset  . Heart disease Father   . Hemachromatosis Father   . Heart attack Father   . Colon cancer Mother   . Peripheral vascular disease Mother   . Peripheral vascular disease Sister   . Cancer Sister   . Lymphoma Sister   . Heart attack Brother   . Heart disease Brother     Social History Social History   Tobacco Use  . Smoking status: Former Smoker    Packs/day: 0.20    Years: 25.00    Pack years: 5.00    Types: Cigarettes    Quit date: 07/06/1966    Years since quitting: 52.6  . Smokeless tobacco: Never Used  Substance Use Topics  . Alcohol use: No    Alcohol/week: 0.0 standard drinks  . Drug use: No     Allergies   Alphagan [brimonidine], Apraclonidine, Biaxin [clarithromycin], Brovana [arformoterol], Budesonide, Cefdinir, Cephalexin, Cephalexin, Cifenline, Clonidine, Clonidine derivatives, Dorzolamide, Iohexol, Nsaids, Penicillins, Sulfamethoxazole, Sulfonamide derivatives, Tetracycline, Timolol, Vancomycin, Zetia [ezetimibe], and Cardizem [diltiazem hcl]   Review of Systems Review of Systems  Respiratory: Negative for shortness of breath.   Cardiovascular: Negative for chest pain.  All other systems reviewed and are negative.    Physical Exam Updated Vital Signs BP 121/86   Pulse (!) 119   Temp 97.9 F (36.6 C) (Oral)   Resp 20   Ht 5\' 6"  (1.676 m)   Wt 56 kg   SpO2 97%   BMI 19.93 kg/m   Physical Exam Vitals signs and nursing note reviewed.  Constitutional:      General: She is not in acute distress.    Appearance: She is well-developed. She is not diaphoretic.  HENT:     Head: Normocephalic and atraumatic.  Neck:     Musculoskeletal: Normal  range of motion and neck supple.  Cardiovascular:     Rate and Rhythm: Normal rate. Rhythm irregular.     Heart sounds: No murmur. No  friction rub. No gallop.   Pulmonary:     Effort: Pulmonary effort is normal. No respiratory distress.     Breath sounds: Normal breath sounds. No wheezing.  Abdominal:     General: Bowel sounds are normal. There is no distension.     Palpations: Abdomen is soft.     Tenderness: There is no abdominal tenderness.  Musculoskeletal: Normal range of motion.        General: No swelling or tenderness.     Right lower leg: No edema.     Left lower leg: No edema.  Skin:    General: Skin is warm and dry.  Neurological:     Mental Status: She is alert and oriented to person, place, and time.      ED Treatments / Results  Labs (all labs ordered are listed, but only abnormal results are displayed) Labs Reviewed  BASIC METABOLIC PANEL  CBC  BRAIN NATRIURETIC PEPTIDE  TROPONIN I (HIGH SENSITIVITY)    EKG EKG Interpretation  Date/Time:  Monday February 20 2019 14:42:43 EDT Ventricular Rate:  114 PR Interval:    QRS Duration: 101 QT Interval:  333 QTC Calculation: 428 R Axis:   82 Text Interpretation:  Atrial fibrillation Low voltage, extremity leads Baseline wander in lead(s) II Confirmed by Veryl Speak [phone]) on 02/20/2019 3:05:17 PM   Radiology No results found.  Procedures Procedures (including critical care time)  Medications Ordered in ED Medications  sodium chloride flush (NS) 0.9 % injection 3 mL (has no administration in time range)     Initial Impression / Assessment and Plan / ED Course  I have reviewed the triage vital signs and the nursing notes.  Pertinent labs & imaging results that were available during my care of the patient were reviewed by me and considered in my medical decision making (see chart for details).  Patient presenting with complaints of rapid heart rate.  She was seen at the primary office, then sent here  for evaluation.  Patient in atrial fibrillation with ventricular rate between 90s and 100s while at rest.  She is asymptomatic with this.  Her laboratory studies and work-up are essentially unremarkable.  Her EKG is unchanged and troponin is negative.  I had a discussion with the patient who adamantly does not want to be in the hospital, as well as the patient's great granddaughter who also says she does not do well in the hospital.  I have spoken with the cardiologist on call, Dr. Rob Hickman who is recommending trying a lower dose of metoprolol.  Patient will be prescribed 25 mg of this and is to follow-up later this week with Dr. Radford Pax in the cardiology clinic.  Patient is to return if she worsens.  Final Clinical Impressions(s) / ED Diagnoses   Final diagnoses:  None    ED Discharge Orders    None       Veryl Speak, MD 02/20/19 1722

## 2019-02-20 NOTE — Discharge Instructions (Addendum)
Begin taking metoprolol 25 mg twice daily.  Follow-up with Dr. Radford Pax in the cardiology clinic later this week, and return to the ER if your symptoms significantly worsen or change.

## 2019-02-20 NOTE — ED Notes (Signed)
EDP at bedside  

## 2019-02-21 ENCOUNTER — Ambulatory Visit (HOSPITAL_COMMUNITY): Payer: Medicare HMO | Admitting: Nurse Practitioner

## 2019-02-21 ENCOUNTER — Telehealth: Payer: Self-pay | Admitting: *Deleted

## 2019-02-21 NOTE — Telephone Encounter (Signed)
-----   Message from Sueanne Margarita, MD sent at 02/20/2019  5:57 PM EDT ----- Regarding: RE: ED Follow-up Please get into afib clinic tomorrow.    Fransico Him ----- Message ----- From: Donato Heinz, MD Sent: 02/20/2019   5:15 PM EDT To: Sueanne Margarita, MD, [facility] Scheduling Subject: ED Follow-up                                   Hi Traci, I received a call from the [facility] ED that Ms Devita [id number] had presented with AF.  She was asymptomatic but had been told her HR was elevated at an INR check so was told to go to ED.  HR was 90-100s; she had stopped taking her metoprolol 37.5 mg as said she did not feel well while taking it. Given she was asymptomatic and did not want admission, ED was going to try a lower dose of metoprolol (25mg ) and discharge her.  They wanted to see if they could get her in to see you soon? Thanks, Gerald Stabs

## 2019-02-21 NOTE — Telephone Encounter (Signed)
I spoke with afib clinic and they can see pt at 3:30 today.  I spoke with pt and explained to her that Dr Radford Pax would like her to be seen in afib clinic. Pt agreeable with this but has no transportation today.  She will try and arrange transportation for tomorrow.  I told pt afib clinic would contact her regarding appointment.

## 2019-02-22 ENCOUNTER — Encounter (HOSPITAL_COMMUNITY): Payer: Self-pay | Admitting: Nurse Practitioner

## 2019-02-22 ENCOUNTER — Ambulatory Visit (HOSPITAL_COMMUNITY)
Admission: RE | Admit: 2019-02-22 | Discharge: 2019-02-22 | Disposition: A | Discharge status: Home / Self Care | Payer: Medicare HMO | Source: Ambulatory Visit | Attending: Nurse Practitioner | Admitting: Nurse Practitioner

## 2019-02-22 ENCOUNTER — Other Ambulatory Visit: Payer: Self-pay

## 2019-02-22 VITALS — BP 110/78 | HR 91 | Ht 66.0 in | Wt 131.8 lb

## 2019-02-22 DIAGNOSIS — Z9049 Acquired absence of other specified parts of digestive tract: Secondary | ICD-10-CM | POA: Insufficient documentation

## 2019-02-22 DIAGNOSIS — H409 Unspecified glaucoma: Secondary | ICD-10-CM | POA: Diagnosis not present

## 2019-02-22 DIAGNOSIS — Z8249 Family history of ischemic heart disease and other diseases of the circulatory system: Secondary | ICD-10-CM | POA: Insufficient documentation

## 2019-02-22 DIAGNOSIS — Z882 Allergy status to sulfonamides status: Secondary | ICD-10-CM | POA: Diagnosis not present

## 2019-02-22 DIAGNOSIS — F419 Anxiety disorder, unspecified: Secondary | ICD-10-CM | POA: Insufficient documentation

## 2019-02-22 DIAGNOSIS — R9431 Abnormal electrocardiogram [ECG] [EKG]: Secondary | ICD-10-CM | POA: Diagnosis not present

## 2019-02-22 DIAGNOSIS — Z881 Allergy status to other antibiotic agents status: Secondary | ICD-10-CM | POA: Insufficient documentation

## 2019-02-22 DIAGNOSIS — Z888 Allergy status to other drugs, medicaments and biological substances status: Secondary | ICD-10-CM | POA: Insufficient documentation

## 2019-02-22 DIAGNOSIS — I4819 Other persistent atrial fibrillation: Secondary | ICD-10-CM | POA: Insufficient documentation

## 2019-02-22 DIAGNOSIS — Z8673 Personal history of transient ischemic attack (TIA), and cerebral infarction without residual deficits: Secondary | ICD-10-CM | POA: Insufficient documentation

## 2019-02-22 DIAGNOSIS — Z91041 Radiographic dye allergy status: Secondary | ICD-10-CM | POA: Diagnosis not present

## 2019-02-22 DIAGNOSIS — Z9861 Coronary angioplasty status: Secondary | ICD-10-CM | POA: Insufficient documentation

## 2019-02-22 DIAGNOSIS — Z7901 Long term (current) use of anticoagulants: Secondary | ICD-10-CM | POA: Diagnosis not present

## 2019-02-22 DIAGNOSIS — Z87891 Personal history of nicotine dependence: Secondary | ICD-10-CM | POA: Insufficient documentation

## 2019-02-22 DIAGNOSIS — Z880 Allergy status to penicillin: Secondary | ICD-10-CM | POA: Diagnosis not present

## 2019-02-22 DIAGNOSIS — Z807 Family history of other malignant neoplasms of lymphoid, hematopoietic and related tissues: Secondary | ICD-10-CM | POA: Insufficient documentation

## 2019-02-22 DIAGNOSIS — M1990 Unspecified osteoarthritis, unspecified site: Secondary | ICD-10-CM | POA: Diagnosis not present

## 2019-02-22 DIAGNOSIS — Z85820 Personal history of malignant melanoma of skin: Secondary | ICD-10-CM | POA: Diagnosis not present

## 2019-02-22 DIAGNOSIS — I10 Essential (primary) hypertension: Secondary | ICD-10-CM | POA: Diagnosis not present

## 2019-02-22 DIAGNOSIS — Z809 Family history of malignant neoplasm, unspecified: Secondary | ICD-10-CM | POA: Insufficient documentation

## 2019-02-22 DIAGNOSIS — I2510 Atherosclerotic heart disease of native coronary artery without angina pectoris: Secondary | ICD-10-CM | POA: Diagnosis not present

## 2019-02-22 DIAGNOSIS — Z800 Family history of malignant neoplasm of digestive organs: Secondary | ICD-10-CM | POA: Insufficient documentation

## 2019-02-22 DIAGNOSIS — J449 Chronic obstructive pulmonary disease, unspecified: Secondary | ICD-10-CM | POA: Insufficient documentation

## 2019-02-22 DIAGNOSIS — Z886 Allergy status to analgesic agent status: Secondary | ICD-10-CM | POA: Insufficient documentation

## 2019-02-22 NOTE — Progress Notes (Signed)
Primary Care Physician: Julian Hy, PA-C Referring Physician: Dr. Radford Pax, [facility] ER f/u Cardiologist: Dr. Orest Dikes is a 85 y.o. female with a h/o paroxysmal afib that is in the afib clinic for f/u of ER visit 02/20/19. She presented for a coumadin visit with her PCP and was found to have an elevated HR. Apparently she was also seen in the ER 7/25 with c/o of palpitations, + afib with RVR and was placed on metoprolol 37.5 mg for rate control. Her other antihypertensives were stopped. She felt this was making her too sleepy and stopped all meds. Hence, the elevation of HR when she presented for her INR. She was stable otherwise, but was still sent to the ER. The BB was reduced to 25 mg bid in the ER/discharged home and she is now back on this drug plus her other antihypertensives and is feeling better. She feels she can tolerate this dose of BB and she has good rate control. She is no longer feeling afib. She does appear to have gone into persistent afib in the last several weeks, EKG in October 2019 showed EKG. . She does not want to pursue cardioversion or AAD's.  Today, she denies symptoms of palpitations, chest pain, shortness of breath, orthopnea, PND, lower extremity edema, dizziness, presyncope, syncope, or neurologic sequela. The patient is tolerating medications without difficulties and is otherwise without complaint today.   Past Medical History:  Diagnosis Date  . Anxiety   . Arthritis   . CAD (coronary artery disease) 2007   a. 1997 s/p PCI of diagonal.  . Constipation   . COPD/Asthma   . Diverticulosis   . Esophageal reflux   . Family history of adverse reaction to anesthesia    daughter had trouble breathing after surgery, had to be reintubated  . Glaucoma   . HTN (hypertension), benign   . Hyperlipidemia   . Malignant melanoma ([place])    a. 09/2012 s/p resection.  . Neuropathy    lower legs  . PAF (paroxysmal atrial fibrillation) (HCC)    a.  intolerant to beta blockers due to asthma and intolerant to CCB due to rash;  b. CHA2DS2VASc = 6-->chronic coumadin;  c. 03/2014 Echo: EF 60-65%, Gr 1 DD, triv AI, mildly dil RV, PASP 34mmhg.  [name] TIA (transient ischemic attack)    a. 02/2011 - chronic coumadin in setting of PAF.  [name] Urinary, incontinence, stress female    Past Surgical History:  Procedure Laterality Date  . ABDOMINAL HYSTERECTOMY    . APPENDECTOMY    . BACK SURGERY     lumbar laminectomy  . CHOLECYSTECTOMY    . COLONOSCOPY    . CORONARY ANGIOPLASTY WITH STENT PLACEMENT    . EYE SURGERY    . NASAL SINUS SURGERY    . VESICOVAGINAL FISTULA CLOSURE W/ TAH    . WEIL OSTEOTOMY Left 06/12/2016   Procedure: Left Chevron and Aiken Left [name], Weil Osteotomy 2nd and 3rd Metatarsal;  Surgeon: Newt Minion, MD;  Location: [facility];  Service: Orthopedics;  Laterality: Left;    Current Outpatient Medications  Medication Sig Dispense Refill  . acetaminophen (TYLENOL) 325 MG tablet Take 325 mg by mouth every 6 (six) hours as needed for moderate pain.     [name] albuterol (PROAIR HFA) 108 (90 Base) MCG/ACT inhaler Inhale 1-2 puffs into the lungs every 4 (four) hours as needed for wheezing or shortness of breath. 1 Inhaler 3  . albuterol (PROVENTIL) (2.5 MG/3ML)  0.083% nebulizer solution Take 3 mLs (2.5 mg total) by nebulization every 6 (six) hours as needed for wheezing or shortness of breath. 360 mL 11  . ALPRAZolam (XANAX) 0.25 MG tablet Take 1 tablet (0.25 mg total) by mouth at bedtime as needed for anxiety. (Patient taking differently: Take 0.25 mg by mouth 2 (two) times daily as needed for anxiety. ) 30 tablet 0  . amLODipine (NORVASC) 10 MG tablet Take 1 tablet (10 mg total) by mouth daily. 90 tablet 3  . cetirizine (ZYRTEC) 10 MG tablet Take 10 mg by mouth daily as needed for allergies.    . Cholecalciferol (VITAMIN D) 2000 units tablet Take 2,000 Units by mouth daily.    . fluticasone (FLONASE) 50 MCG/ACT nasal spray USE 1 SPRAY IN EACH  NOSTRIL ONCE A DAY AS NEEDED FOR ALLERGIES OR RHINITIS (Patient taking differently: Place 1 spray into both nostrils daily as needed for allergies or rhinitis. ) 48 g 1  . fluticasone furoate-vilanterol (BREO ELLIPTA) 200-25 MCG/INH AEPB Inhale 1 puff into the lungs daily. 60 each 11  . fluticasone furoate-vilanterol (BREO ELLIPTA) 200-25 MCG/INH AEPB Breo Ellipta 200 mcg-25 mcg/dose powder for inhalation    . furosemide (LASIX) 40 MG tablet Take 1 tablet (40 mg total) by mouth daily as needed for fluid. 90 tablet 3  . HYDROcodone-acetaminophen (NORCO) 5-325 MG tablet Take 1 tablet by mouth every 6 (six) hours as needed for severe pain. 20 tablet 0  . ipratropium-albuterol (DUONEB) 0.5-2.5 (3) MG/3ML SOLN Take 3 mLs by nebulization every 6 (six) hours as needed (shortness of breath/wheezing). 360 mL 2  . latanoprost (XALATAN) 0.005 % ophthalmic solution Place 1 drop into both eyes at bedtime.    . Latanoprost 0.005 % EMUL latanoprost 0.005 % eye drops    . metoprolol tartrate (LOPRESSOR) 25 MG tablet Take 1 tablet (25 mg total) by mouth 2 (two) times daily. 40 tablet 0  . montelukast (SINGULAIR) 10 MG tablet Take 1 tablet (10 mg total) by mouth at bedtime. 30 tablet 11  . Multiple Vitamins-Minerals (MULTIVITAMIN & MINERAL PO) Take 1 tablet by mouth daily.    . mupirocin ointment (BACTROBAN) 2 % mupirocin 2 % topical ointment  APPLY TO AFFECTED AREA ONCE DAILY    . pilocarpine (PILOCAR) 1 % ophthalmic solution Place 1 drop into both eyes 3 (three) times daily.  9  . senna-docusate (SENOKOT-S) 8.6-50 MG tablet Take 2 tablets by mouth at bedtime as needed for mild constipation. 30 tablet 0  . traZODone (DESYREL) 100 MG tablet Take 100 mg by mouth at bedtime as needed for sleep.   2  . warfarin (COUMADIN) 2.5 MG tablet Take 1-1.5 tablets (2.5-3.75 mg total) by mouth daily. Take 7.5mg  (3 tablets) for the next 3 days (today, Saturday and Sunday). On Monday check INR and resume your normal dosing as noted  below.  Take 3.75 mg (1.5 tablets) on Sun, Tues, Weds, Thurs, Sat & Take 2.5 mg (1 tablet) on Mon & Fri (Patient taking differently: Take 5 mg by mouth daily at 6 PM. Checked monthly,  Dose may change accordingly to INR) 30 tablet 0  . lisinopril (PRINIVIL,ZESTRIL) 40 MG tablet Take 1 tablet (40 mg total) by mouth daily. 90 tablet 3   Current Facility-Administered Medications  Medication Dose Route Frequency Provider Last Rate Last Dose  . omalizumab [name]) injection 375 mg  375 mg Subcutaneous Q14 Days Juanito Doom, MD   375 mg at 01/11/17 7939    Allergies  Allergen Reactions  . Alphagan [Brimonidine] Other (See Comments)    Burning sensation  . Apraclonidine Other (See Comments)    Pain, brow pain, tender, not able to tolerate  . Biaxin [Clarithromycin]     Unknown, per pt   . Brovana [Arformoterol] Other (See Comments)    Makes my heart race  . Budesonide Other (See Comments)    Makes my heart race  . Cefdinir     "FEEL HORRIBLE"  . Cephalexin Itching  . Cephalexin Nausea Only  . Cifenline Itching  . Clonidine Other (See Comments)    unknown  . Clonidine Derivatives Other (See Comments)    REACTION: unknown  . Dorzolamide Other (See Comments)    Redness  . Iodinated Diagnostic Agents   . Iohexol Other (See Comments)     Desc: NOTES FROM PRIOR CT STATES PRE MEDICATION PRIOR TO OMNIPAQUE ENHANCED CT   . Nsaids Other (See Comments)    Unknown  . Penicillins Itching, Swelling and Other (See Comments)    Swelling and edema of ears and rash Swelling to ears  Has patient had a PCN reaction causing immediate rash, facial/tongue/throat swelling, SOB or lightheadedness with hypotension: {no Has patient had a PCN reaction causing severe rash involving mucus membranes or skin necrosis: {no Has patient had a PCN reaction that required hospitalization no Has patient had a PCN reaction occurring within the last 10 years: no If all of the above answers are "NO", then may  proceed with Cephalosporin use.  . Sulfamethoxazole Other (See Comments)    Unknown  . Sulfonamide Derivatives Itching  . Tetracycline Itching and Other (See Comments)    unknown  . Timolol Other (See Comments)    Eye red and irritated   . Vancomycin Other (See Comments)    Turn red  . Zetia [Ezetimibe] Other (See Comments)    Unknown  . Cardizem [Diltiazem Hcl] Rash    Social History   Socioeconomic History  . Marital status: Divorced    Spouse name: Not on file  . Number of children: Not on file  . Years of education: Not on file  . Highest education level: Not on file  Occupational History  . 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   Social Needs  . Financial resource strain: Not on file  . Food insecurity    Worry: Not on file    Inability: Not on file  . Transportation needs    Medical: Not on file    Non-medical: Not on file  Tobacco Use  . Smoking status: Former Smoker    Packs/day: 0.20    Years: 25.00    Pack years: 5.00    Types: Cigarettes    Quit date: 07/06/1966    Years since quitting: 52.6  . Smokeless tobacco: Never Used  Substance and Sexual Activity  . Alcohol use: No    Alcohol/week: 0.0 standard drinks  . Drug use: No  . Sexual activity: Not on file  Lifestyle  . Physical activity    Days per week: Not on file    Minutes per session: Not on file  . Stress: Not on file  Relationships  . Social Herbalist on phone: Not on file    Gets together: Not on file    Attends religious service: Not on file    Active member of club or organization: Not on file    Attends meetings of clubs or  organizations: Not on file    Relationship status: Not on file  . Intimate partner violence    Fear of current or ex partner: Not on file    Emotionally abused: Not on file    Physically abused: Not on file    Forced sexual activity: Not on file  Other Topics Concern  . Not on file  Social History Narrative    Lives in [place] with her dog.  She is very independent.    Family History  Problem Relation Age of Onset  . Heart disease Father   . Hemachromatosis Father   . Heart attack Father   . Colon cancer Mother   . Peripheral vascular disease Mother   . Peripheral vascular disease Sister   . Cancer Sister   . Lymphoma Sister   . Heart attack Brother   . Heart disease Brother     ROS- All systems are reviewed and negative except as per the HPI above  Physical Exam: Vitals:   02/22/19 1155  BP: 110/78  Pulse: 91  Weight: 59.8 kg  Height: 5\' 6"  (1.676 m)   Wt Readings from Last 3 Encounters:  02/22/19 59.8 kg  02/20/19 56 kg  01/28/19 56.7 kg    Labs: Lab Results  Component Value Date   NA 138 02/20/2019   K 4.5 02/20/2019   CL 102 02/20/2019   CO2 28 02/20/2019   GLUCOSE 115 (H) 02/20/2019   BUN 16 02/20/2019   CREATININE 0.74 02/20/2019   CALCIUM 8.7 (L) 02/20/2019   MG 2.2 01/28/2019   Lab Results  Component Value Date   INR 1.6 (H) 01/28/2019   Lab Results  Component Value Date   CHOL 134 11/20/2015   HDL 76 11/20/2015   LDLCALC 45 11/20/2015   TRIG 64 11/20/2015     GEN- The patient is well appearing, alert and oriented x 3 today.   Head- normocephalic, atraumatic Eyes-  Sclera clear, conjunctiva pink Ears- hearing intact Oropharynx- clear Neck- supple, no JVP Lymph- no cervical lymphadenopathy Lungs- Clear to ausculation bilaterally, normal work of breathing Heart- irregular rate and rhythm, no murmurs, rubs or gallops, PMI not laterally displaced GI- soft, NT, ND, + BS Extremities- no clubbing, cyanosis, or edema MS- no significant deformity or atrophy Skin- no rash or lesion Psych- euthymic mood, full affect Neuro- strength and sensation are intact  EKG-afib at 91 bpm, qrs int 92 ms, qt 447 ms, qtc 442 ms Echo-2016-Study Conclusions  - Left ventricle: The cavity size was normal. Wall thickness was   normal. Systolic function was normal. The  estimated ejection   fraction was in the range of 60% to 65%. Wall motion was normal;   there were no regional wall motion abnormalities.    Assessment and Plan: 1. Persistent afib She appears to have gone from paroxysmal to persistent afib in the last month or so .  She felt 37.5 mg metoprolol tartrate bid was too much for her so she stopped it  and had RVR 2/2 to this She is now on 25 mg bid and is tolerating with rate control At this point she says she is asymptomatic and does not want to try measures to restore SR, clear she wants a conservative approach Her granddaughter, who is a Therapist, sports, is with her and is in agreement She will continue with coumadin with a CHA2DS2VASc score of at least 6. INR on Monday was 2.2 at PCP We discussed if she is bothered with palpitations she  could take an extra 1/2 tab of BB  Butch Penny C. Shakya Sebring, [facility] [address] [place], [place] [id number] [phone]

## 2019-03-07 ENCOUNTER — Telehealth: Payer: Self-pay

## 2019-03-07 NOTE — Progress Notes (Signed)
Virtual Visit via Video Note   This visit type was conducted due to national recommendations for restrictions regarding the COVID-19 Pandemic (e.g. social distancing) in an effort to limit this patient's exposure and mitigate transmission in our community.  Due to her co-morbid illnesses, this patient is at least at moderate risk for complications without adequate follow up.  This format is felt to be most appropriate for this patient at this time.  All issues noted in this document were discussed and addressed.  A limited physical exam was performed with this format.  Please refer to the patient's chart for her consent to telehealth for [facility].   Evaluation Performed:  Follow-up visit  This visit type was conducted due to national recommendations for restrictions regarding the COVID-19 Pandemic (e.g. social distancing).  This format is felt to be most appropriate for this patient at this time.  All issues noted in this document were discussed and addressed.  No physical exam was performed (except for noted visual exam findings with Video Visits).  Please refer to the patient's chart (MyChart message for video visits and phone note for telephone visits) for the patient's consent to telehealth for [facility].  Date:  03/08/2019   ID:  [name], DOB [birth year], MRN [id number]  Patient Location:  Home  Provider location:   [place]  PCP:  Julian Hy, PA-C  Cardiologist:  Fransico Him, MD  Electrophysiologist:  None   Chief Complaint:  CAD, HTN, HLD  History of Present Illness:    [name] is a 85 y.o. female who presents via audio/video conferencing for a telehealth visit today.    [name] is a 85 y.o. female with a hx of ASCAD s/p remote PCI of the diagin 1997, persistent atrial fibrillation followed in afib clinicwith CHADS2VASC score of 6 on chronic anticoagulation with Coumadin, HTN, hyperlipidemia. This summer she has  had problems with recurrent afib and was seen in afib clinic 02/22/2019 and was in afib with good rate control on Lopressor 25mg  BID.  Since she was asymptomatic she did not want to pursue AADT and the decision was made to pursue rate control.    She is here today for followup and is doing well.  She denies any chest pain or pressure,  PND, orthopnea, LE edema, dizziness, palpitations or syncope. She has chronic DOE but says that recently it has gotten worse which her daughter thinks is due to deconditioning as she has not been active at all and has been sitting due to [place]. Her daughter is very concerned because she has noticed that her mom's face seems to droop some.  She is able to smile and the smile is symmetric and stick out her tongue which is symmetric.  She has no slurred speech and the patient feels fine.  She is compliant with her meds and is tolerating meds with no SE.    The patient does not have symptoms concerning for COVID-19 infection (fever, chills, cough, or new shortness of breath).   Prior CV studies:   The following studies were reviewed today:  none  Past Medical History:  Diagnosis Date  . Anxiety   . Arthritis   . CAD (coronary artery disease) 2007   a. 1997 s/p PCI of diagonal.  . Constipation   . COPD/Asthma   . Diverticulosis   . Esophageal reflux   . Family history of adverse reaction to anesthesia    daughter had trouble breathing after surgery, had  to be reintubated  . Glaucoma   . HTN (hypertension), benign   . Hyperlipidemia   . Malignant melanoma ([place])    a. 09/2012 s/p resection.  . Neuropathy    lower legs  . PAF (paroxysmal atrial fibrillation) (HCC)    a. intolerant to beta blockers due to asthma and intolerant to CCB due to rash;  b. CHA2DS2VASc = 6-->chronic coumadin;  c. 03/2014 Echo: EF 60-65%, Gr 1 DD, triv AI, mildly dil RV, PASP 41mmhg.  [name] TIA (transient ischemic attack)    a. 02/2011 - chronic coumadin in setting of PAF.  [name] Urinary,  incontinence, stress female    Past Surgical History:  Procedure Laterality Date  . ABDOMINAL HYSTERECTOMY    . APPENDECTOMY    . BACK SURGERY     lumbar laminectomy  . CHOLECYSTECTOMY    . COLONOSCOPY    . CORONARY ANGIOPLASTY WITH STENT PLACEMENT    . EYE SURGERY    . NASAL SINUS SURGERY    . VESICOVAGINAL FISTULA CLOSURE W/ TAH    . WEIL OSTEOTOMY Left 06/12/2016   Procedure: Left [place] and [place] Left [name], Weil Osteotomy 2nd and 3rd Metatarsal;  Surgeon: Newt Minion, MD;  Location: [place];  Service: Orthopedics;  Laterality: Left;     Current Meds  Medication Sig  . acetaminophen (TYLENOL) 325 MG tablet Take 325 mg by mouth every 6 (six) hours as needed for moderate pain.   [name] albuterol (PROAIR HFA) 108 (90 Base) MCG/ACT inhaler Inhale 1-2 puffs into the lungs every 4 (four) hours as needed for wheezing or shortness of breath.  [name] albuterol (PROVENTIL) (2.5 MG/3ML) 0.083% nebulizer solution Take 3 mLs (2.5 mg total) by nebulization every 6 (six) hours as needed for wheezing or shortness of breath.  . ALPRAZolam (XANAX) 0.5 MG tablet Take 0.5 mg by mouth 2 (two) times daily as needed for anxiety.  [name] amLODipine (NORVASC) 10 MG tablet Take 1 tablet (10 mg total) by mouth daily.  . cetirizine (ZYRTEC) 10 MG tablet Take 10 mg by mouth daily as needed for allergies.  . Cholecalciferol (VITAMIN D) 2000 units tablet Take 2,000 Units by mouth daily.  . citalopram (CELEXA) 10 MG tablet Take 10 mg by mouth daily.  . fluticasone (FLONASE) 50 MCG/ACT nasal spray USE 1 SPRAY IN EACH NOSTRIL ONCE A DAY AS NEEDED FOR ALLERGIES OR RHINITIS (Patient taking differently: Place 1 spray into both nostrils daily as needed for allergies or rhinitis. )  . fluticasone furoate-vilanterol (BREO ELLIPTA) 200-25 MCG/INH AEPB Inhale 1 puff into the lungs daily.  . furosemide (LASIX) 40 MG tablet Take 1 tablet (40 mg total) by mouth daily as needed for fluid.  [name] latanoprost (XALATAN) 0.005 % ophthalmic  solution Place 1 drop into both eyes at bedtime.  . metoprolol tartrate (LOPRESSOR) 25 MG tablet Take 1 tablet (25 mg total) by mouth 2 (two) times daily.  . montelukast (SINGULAIR) 10 MG tablet Take 1 tablet (10 mg total) by mouth at bedtime.  . Multiple Vitamins-Minerals (MULTIVITAMIN & MINERAL PO) Take 1 tablet by mouth daily.  . mupirocin ointment (BACTROBAN) 2 % mupirocin 2 % topical ointment  APPLY TO AFFECTED AREA ONCE DAILY  . pilocarpine (PILOCAR) 1 % ophthalmic solution Place 1 drop into both eyes 3 (three) times daily.  . traZODone (DESYREL) 100 MG tablet Take 100 mg by mouth at bedtime as needed for sleep.   [name] warfarin (COUMADIN) 2.5 MG tablet Take 1-1.5 tablets (2.5-3.75 mg total)  by mouth daily. Take 7.5mg  (3 tablets) for the next 3 days (today, Saturday and Sunday). On Monday check INR and resume your normal dosing as noted below.  Take 3.75 mg (1.5 tablets) on Sun, Tues, Weds, Thurs, Sat & Take 2.5 mg (1 tablet) on Mon & Fri (Patient taking differently: Take 5 mg by mouth daily at 6 PM. Checked monthly,  Dose may change accordingly to INR)   Current Facility-Administered Medications for the 03/08/19 encounter (Telemedicine) with Sueanne Margarita, MD  Medication  . omalizumab [name]) injection 375 mg     Allergies:   Alphagan [brimonidine], Apraclonidine, Biaxin [clarithromycin], Brovana [arformoterol], Budesonide, Cefdinir, Cephalexin, Cephalexin, Cifenline, Clonidine, Clonidine derivatives, Dorzolamide, Iodinated diagnostic agents, Iohexol, Nsaids, Penicillins, Sulfamethoxazole, Sulfonamide derivatives, Tetracycline, Timolol, Vancomycin, Zetia [ezetimibe], and Cardizem [diltiazem hcl]   Social History   Tobacco Use  . Smoking status: Former Smoker    Packs/day: 0.20    Years: 25.00    Pack years: 5.00    Types: Cigarettes    Quit date: 07/06/1966    Years since quitting: 52.7  . Smokeless tobacco: Never Used  Substance Use Topics  . Alcohol use: No    Alcohol/week: 0.0  standard drinks  . Drug use: No     Family Hx: The patient's family history includes Cancer in her sister; Colon cancer in her mother; Heart attack in her brother and father; Heart disease in her brother and father; Hemachromatosis in her father; Lymphoma in her sister; Peripheral vascular disease in her mother and sister.  ROS:   Please see the history of present illness.     All other systems reviewed and are negative.   Labs/Other Tests and Data Reviewed:    Recent Labs: 04/05/2018: ALT 14 06/21/2018: Pro B Natriuretic peptide (BNP) 28.0 01/28/2019: Magnesium 2.2 02/20/2019: B Natriuretic Peptide 195.4; BUN 16; Creatinine, Ser 0.74; Hemoglobin 10.4; Platelets 135; Potassium 4.5; Sodium 138   Recent Lipid Panel Lab Results  Component Value Date/Time   CHOL 134 11/20/2015 11:44 AM   TRIG 64 11/20/2015 11:44 AM   HDL 76 11/20/2015 11:44 AM   CHOLHDL 1.8 11/20/2015 11:44 AM   LDLCALC 45 11/20/2015 11:44 AM    Wt Readings from Last 3 Encounters:  03/08/19 134 lb (60.8 kg)  02/22/19 131 lb 12.8 oz (59.8 kg)  02/20/19 123 lb 7.3 oz (56 kg)     Objective:    Vital Signs:  Pulse 86   Ht 5\' 4"  (1.626 m)   Wt 134 lb (60.8 kg)   BMI 23.00 kg/m    CONSTITUTIONAL:  Well nourished, well developed female in no acute distress.  EYES: anicteric MOUTH: oral mucosa is pink RESPIRATORY: Normal respiratory effort, symmetric expansion CARDIOVASCULAR: No peripheral edema SKIN: No rash, lesions or ulcers MUSCULOSKELETAL: no digital cyanosis NEURO: Cranial Nerves II-XII grossly intact, moves all extremities PSYCH: Intact judgement and insight.  A&O x 3, Mood/affect appropriate   ASSESSMENT & PLAN:    1.  ASCAD - s/p remote PCI of the diagin 1997.  She has not had any anginal sx.  Continue BB.  No ASA due to advanced age and warfarin.  2.  HTN - BP controlled.  Continue Lisinopril 40mg  daily, Lopressor 25mg  BID and amlodipine 10mg  daily.  3.  Permanent atrial fibrillation - HR is  controlled on current BB dose.  Continue warfarin. Her INR is followed by her PCP.    4.  Hyperlipidemia - LDL goal < 70.  I will get a copy of last  FLP from PCP.  She is statin intolerant and intolerant to Zetia.  Given advanced age we have not pursued PCSK9i.   5.  Facial droop - her daughter is very concerned that she looks like she has a facial droop.  On exam she does not have any tongue deviation and when she smiles her smile is equal on both sides. She tends to sit with her head towards the left and I think her head then is tilted and causes the skin to sag on the left side of her face.  I do not think she has had a CVA but recommended she call her PCP this am.  6.  SOB - her daughter thinks this is related to sedentary state but is concerned that she gets SOB with minimal exertion.  I recommended a 2D echo to reassess LVF.  Her daughter wants to wait due to [place].  Her daughter stated that her lungs are clear.    COVID-19 Education: The signs and symptoms of COVID-19 were discussed with the patient and how to seek care for testing (follow up with PCP or arrange E-visit).  The importance of social distancing was discussed today.  Patient Risk:   After full review of this patient's clinical status, I feel that they are at least moderate risk at this time.  Time:   Today, I have spent 20 minutes directly with the patient on telemedicine discussing medical problems including CAD, afib, HTN, HLD.  We also reviewed the symptoms of COVID 19 and the ways to protect against contracting the virus with telehealth technology.  I spent an additional 5 minutes reviewing patient's chart including labs.  Medication Adjustments/Labs and Tests Ordered: Current medicines are reviewed at length with the patient today.  Concerns regarding medicines are outlined above.  Tests Ordered: No orders of the defined types were placed in this encounter.  Medication Changes: No orders of the defined types were  placed in this encounter.   Disposition:  Follow up in 1 year(s)  Signed, Fransico Him, MD  03/08/2019 9:02 AM    [place]

## 2019-03-07 NOTE — Telephone Encounter (Signed)
Call pt and spoke with granddaughter and reviewed meds

## 2019-03-08 ENCOUNTER — Telehealth: Payer: Self-pay | Admitting: *Deleted

## 2019-03-08 ENCOUNTER — Other Ambulatory Visit: Payer: Self-pay

## 2019-03-08 ENCOUNTER — Telehealth (INDEPENDENT_AMBULATORY_CARE_PROVIDER_SITE_OTHER): Payer: Medicare HMO | Admitting: Cardiology

## 2019-03-08 VITALS — HR 86 | Ht 64.0 in | Wt 134.0 lb

## 2019-03-08 DIAGNOSIS — I10 Essential (primary) hypertension: Secondary | ICD-10-CM | POA: Diagnosis not present

## 2019-03-08 DIAGNOSIS — E785 Hyperlipidemia, unspecified: Secondary | ICD-10-CM | POA: Diagnosis not present

## 2019-03-08 DIAGNOSIS — I2510 Atherosclerotic heart disease of native coronary artery without angina pectoris: Secondary | ICD-10-CM

## 2019-03-08 DIAGNOSIS — I4821 Permanent atrial fibrillation: Secondary | ICD-10-CM

## 2019-03-08 NOTE — Telephone Encounter (Signed)
Virtual Visit Pre-Appointment Phone Call  "(Name), I am calling you today to discuss your upcoming appointment. We are currently trying to limit exposure to the virus that causes COVID-19 by seeing patients at home rather than in the office."  1. "What is the BEST phone number to call the day of the visit?" - include this in appointment notes -[phone]  2. "Do you have or have access to (through a family member/friend) a smartphone with video capability that we can use for your visit?" yes a. If yes - list this number in appt notes as "cell" (if different from BEST phone #) and list the appointment type as a VIDEO visit in appointment notes b. If no - list the appointment type as a PHONE visit in appointment notes  3. Confirm consent - "In the setting of the current Covid19 crisis, you are scheduled for a video visit with your provider on September 2,2020 at 8:40  Just as we do with many in-office visits, in order for you to participate in this visit, we must obtain consent.  If you'd like, I can send this to your mychart (if signed up) or email for you to review.  Otherwise, I can obtain your verbal consent now.  All virtual visits are billed to your insurance company just like a normal visit would be.  By agreeing to a virtual visit, we'd like you to understand that the technology does not allow for your provider to perform an examination, and thus may limit your provider's ability to fully assess your condition. If your provider identifies any concerns that need to be evaluated in person, we will make arrangements to do so.  Finally, though the technology is pretty good, we cannot assure that it will always work on either your or our end, and in the setting of a video visit, we may have to convert it to a phone-only visit.  In either situation, we cannot ensure that we have a secure connection.  Are you willing to proceed?" STAFF: Did the patient verbally acknowledge consent to telehealth  visit? Document YES/NO here: pt's great grand daughter gave consent  4. Advise patient to be prepared - "Two hours prior to your appointment, go ahead and check your blood pressure, pulse, oxygen saturation, and your weight (if you have the equipment to check those) and write them all down. When your visit starts, your provider will ask you for this information. If you have an Apple Watch or Kardia device, please plan to have heart rate information ready on the day of your appointment. Please have a pen and paper handy nearby the day of the visit as well."  5. Give patient instructions for MyChart download to smartphone OR Doximity/Doxy.me as below if video visit (depending on what platform provider is using)  6. Inform patient they will receive a phone call 15 minutes prior to their appointment time (may be from unknown caller ID) so they should be prepared to answer    TELEPHONE CALL NOTE  [name] has been deemed a candidate for a follow-up tele-health visit to limit community exposure during the Covid-19 pandemic. I spoke with the patient via phone to ensure availability of phone/video source, confirm preferred email & phone number, and discuss instructions and expectations.  I reminded [name] to be prepared with any vital sign and/or heart rhythm information that could potentially be obtained via home monitoring, at the time of her visit. I reminded [name] to  expect a phone call prior to her visit.  Leodis Liverpool, RN 03/08/2019 8:20 AM   INSTRUCTIONS FOR DOWNLOADING THE MYCHART APP TO SMARTPHONE  - The patient must first make sure to have activated MyChart and know their login information - If Apple, go to [employer] and type in MyChart in the search bar and download the app. If Android, ask patient to go to [employer] and type in [place] in the search bar and download the app. The app is free but as with any other app downloads, their  phone may require them to verify saved payment information or Apple/Android password.  - The patient will need to then log into the app with their MyChart username and password, and select [facility] as their healthcare provider to link the account. When it is time for your visit, go to the MyChart app, find appointments, and click Begin Video Visit. Be sure to Select Allow for your device to access the Microphone and Camera for your visit. You will then be connected, and your provider will be with you shortly.  **If they have any issues connecting, or need assistance please contact MyChart service desk [phone])**  **If using a computer, in order to ensure the best quality for their visit they will need to use either of the following Internet Browsers: [employer], or Google Chrome**  IF USING DOXIMITY or DOXY.ME - The patient will receive a link just prior to their visit by text.     FULL LENGTH CONSENT FOR TELE-HEALTH VISIT   I hereby voluntarily request, consent and authorize [facility] and its employed or contracted physicians, physician assistants, nurse practitioners or other licensed health care professionals (the Practitioner), to provide me with telemedicine health care services (the "Services") as deemed necessary by the treating Practitioner. I acknowledge and consent to receive the Services by the Practitioner via telemedicine. I understand that the telemedicine visit will involve communicating with the Practitioner through live audiovisual communication technology and the disclosure of certain medical information by electronic transmission. I acknowledge that I have been given the opportunity to request an in-person assessment or other available alternative prior to the telemedicine visit and am voluntarily participating in the telemedicine visit.  I understand that I have the right to withhold or withdraw my consent to the use of telemedicine in the course of  my care at any time, without affecting my right to future care or treatment, and that the Practitioner or I may terminate the telemedicine visit at any time. I understand that I have the right to inspect all information obtained and/or recorded in the course of the telemedicine visit and may receive copies of available information for a reasonable fee.  I understand that some of the potential risks of receiving the Services via telemedicine include:  [name] Delay or interruption in medical evaluation due to technological equipment failure or disruption; . Information transmitted may not be sufficient (e.g. poor resolution of images) to allow for appropriate medical decision making by the Practitioner; and/or  . In rare instances, security protocols could fail, causing a breach of personal health information.  Furthermore, I acknowledge that it is my responsibility to provide information about my medical history, conditions and care that is complete and accurate to the best of my ability. I acknowledge that Practitioner's advice, recommendations, and/or decision may be based on factors not within their control, such as incomplete or inaccurate data provided by me or distortions of diagnostic images or specimens that  may result from electronic transmissions. I understand that the practice of medicine is not an exact science and that Practitioner makes no warranties or guarantees regarding treatment outcomes. I acknowledge that I will receive a copy of this consent concurrently upon execution via email to the email address I last provided but may also request a printed copy by calling the office of [facility].    I understand that my insurance will be billed for this visit.   I have read or had this consent read to me. . I understand the contents of this consent, which adequately explains the benefits and risks of the Services being provided via telemedicine.  . I have been provided ample opportunity to ask  questions regarding this consent and the Services and have had my questions answered to my satisfaction. . I give my informed consent for the services to be provided through the use of telemedicine in my medical care  By participating in this telemedicine visit I agree to the above.

## 2019-03-08 NOTE — Patient Instructions (Signed)
Medication Instructions:  Your physician recommends that you continue on your current medications as directed. Please refer to the Current Medication list given to you today.  If you need a refill on your cardiac medications before your next appointment, please call your pharmacy.   Lab work: none If you have labs (blood work) drawn today and your tests are completely normal, you will receive your results only by: [name] MyChart Message (if you have MyChart) OR . A paper copy in the mail If you have any lab test that is abnormal or we need to change your treatment, we will call you to review the results.  Testing/Procedures: none  Follow-Up: At [facility], you and your health needs are our priority.  As part of our continuing mission to provide you with exceptional heart care, we have created designated Provider Care Teams.  These Care Teams include your primary Cardiologist (physician) and Advanced Practice Providers (APPs -  Physician Assistants and Nurse Practitioners) who all work together to provide you with the care you need, when you need it. You will need a follow up appointment in 6 months with PA or NP (virtual visit) and one year with Dr. Radford Pax. .  Please call our office 2 months in advance to schedule this appointment.  You may see  one of the following Advanced Practice Providers on your designated Care Team:   Lyda Jester, PA-C Melina Copa, PA-C . Ermalinda Barrios, PA-C  Any Other Special Instructions Will Be Listed Below (If Applicable).

## 2019-03-14 MED ORDER — NEBIVOLOL HCL 2.5 MG PO TABS: 2.5000 mg | ORAL_TABLET | Freq: Every day | ORAL | 11 refills | Status: DC

## 2019-04-25 ENCOUNTER — Other Ambulatory Visit: Payer: Self-pay | Admitting: Pulmonary Disease

## 2019-04-28 ENCOUNTER — Other Ambulatory Visit: Payer: Self-pay

## 2019-04-28 ENCOUNTER — Ambulatory Visit: Payer: Medicare HMO | Admitting: Podiatry

## 2019-04-28 ENCOUNTER — Ambulatory Visit (INDEPENDENT_AMBULATORY_CARE_PROVIDER_SITE_OTHER): Payer: Medicare HMO

## 2019-04-28 ENCOUNTER — Encounter: Payer: Self-pay | Admitting: Podiatry

## 2019-04-28 ENCOUNTER — Other Ambulatory Visit: Payer: Self-pay | Admitting: Podiatry

## 2019-04-28 DIAGNOSIS — L97522 Non-pressure chronic ulcer of other part of left foot with fat layer exposed: Secondary | ICD-10-CM

## 2019-04-28 DIAGNOSIS — E11621 Type 2 diabetes mellitus with foot ulcer: Secondary | ICD-10-CM

## 2019-04-28 DIAGNOSIS — L97422 Non-pressure chronic ulcer of left heel and midfoot with fat layer exposed: Secondary | ICD-10-CM

## 2019-04-28 DIAGNOSIS — M79672 Pain in left foot: Secondary | ICD-10-CM | POA: Diagnosis not present

## 2019-04-28 NOTE — Progress Notes (Signed)
Subjective:  Patient ID: [name], female    DOB: 13-Mar-1924,  MRN: [id number]  Chief Complaint  Patient presents with  . Diabetic Ulcer    Left plantar forefoot ulcer check. Pt denies fever/chills/nausea/vomiting. Denies drainage, except occasional blood. Pt states 3 year duration, has had previous surgery and treatment on this foot.    85 y.o. female presents for wound care.  SHe states that this wound has been present for a couple of months now.  States his left submet 3 wound is very chronic in nature.  She had previous surgeries done by Dr. Sharol Given.  She stated that this has not helped heal at all.  She has been applying triple antibiotic and a Band-Aid to the wound.  She is here to get an evaluation of the wound and what can we do to make sure that this wound gets healed up.  She denies any nausea fever chills vomiting.  She ambulates with regular sneakers and a cane.   Review of Systems: Negative except as noted in the HPI. Denies N/V/F/Ch.  Past Medical History:  Diagnosis Date  . Anxiety   . Arthritis   . CAD (coronary artery disease) 2007   a. 1997 s/p PCI of diagonal.  . Constipation   . COPD/Asthma   . Diverticulosis   . Esophageal reflux   . Family history of adverse reaction to anesthesia    daughter had trouble breathing after surgery, had to be reintubated  . Glaucoma   . HTN (hypertension), benign   . Hyperlipidemia   . Malignant melanoma ([place])    a. 09/2012 s/p resection.  . Neuropathy    lower legs  . PAF (paroxysmal atrial fibrillation) (HCC)    a. intolerant to beta blockers due to asthma and intolerant to CCB due to rash;  b. CHA2DS2VASc = 6-->chronic coumadin;  c. 03/2014 Echo: EF 60-65%, Gr 1 DD, triv AI, mildly dil RV, PASP 68mmhg.  [name] TIA (transient ischemic attack)    a. 02/2011 - chronic coumadin in setting of PAF.  [name] Urinary, incontinence, stress female     Current Outpatient Medications:  .  acetaminophen (TYLENOL) 325 MG tablet, Take 325  mg by mouth every 6 (six) hours as needed for moderate pain. , Disp: , Rfl:  .  albuterol (PROAIR HFA) 108 (90 Base) MCG/ACT inhaler, Inhale 1-2 puffs into the lungs every 4 (four) hours as needed for wheezing or shortness of breath., Disp: 1 Inhaler, Rfl: 3 .  albuterol (PROVENTIL) (2.5 MG/3ML) 0.083% nebulizer solution, Take 3 mLs (2.5 mg total) by nebulization every 6 (six) hours as needed for wheezing or shortness of breath., Disp: 360 mL, Rfl: 11 .  ALPRAZolam (XANAX) 0.5 MG tablet, Take 0.5 mg by mouth 2 (two) times daily as needed for anxiety., Disp: , Rfl:  .  amLODipine (NORVASC) 10 MG tablet, Take 1 tablet (10 mg total) by mouth daily., Disp: 90 tablet, Rfl: 3 .  cetirizine (ZYRTEC) 10 MG tablet, Take 10 mg by mouth daily as needed for allergies., Disp: , Rfl:  .  citalopram (CELEXA) 10 MG tablet, Take 10 mg by mouth daily., Disp: , Rfl:  .  diltiazem (TIADYLT ER) 120 MG 24 hr capsule, Tiadylt ER 120 mg capsule,extended release, Disp: , Rfl:  .  fluticasone (FLONASE) 50 MCG/ACT nasal spray, USE 1 SPRAY IN EACH NOSTRIL ONCE A DAY AS NEEDED FOR ALLERGIES OR RHINITIS, Disp: 48 mL, Rfl: 0 .  fluticasone furoate-vilanterol (BREO ELLIPTA) 200-25 MCG/INH AEPB, Inhale  1 puff into the lungs daily., Disp: 60 each, Rfl: 11 .  furosemide (LASIX) 40 MG tablet, Take 1 tablet (40 mg total) by mouth daily as needed for fluid., Disp: 90 tablet, Rfl: 3 .  latanoprost (XALATAN) 0.005 % ophthalmic solution, Place 1 drop into both eyes at bedtime., Disp: , Rfl:  .  metoprolol tartrate (LOPRESSOR) 25 MG tablet, metoprolol tartrate 25 mg tablet, Disp: , Rfl:  .  montelukast (SINGULAIR) 10 MG tablet, Take 1 tablet (10 mg total) by mouth at bedtime., Disp: 30 tablet, Rfl: 11 .  Multiple Vitamins-Minerals (MULTIVITAMIN & MINERAL PO), Take 1 tablet by mouth daily., Disp: , Rfl:  .  mupirocin ointment (BACTROBAN) 2 %, mupirocin 2 % topical ointment  APPLY TO AFFECTED AREA ONCE DAILY, Disp: , Rfl:  .  nebivolol  (BYSTOLIC) 2.5 MG tablet, Take 1 tablet (2.5 mg total) by mouth daily., Disp: 30 tablet, Rfl: 11 .  pilocarpine (PILOCAR) 1 % ophthalmic solution, Place 1 drop into both eyes 3 (three) times daily., Disp: , Rfl: 9 .  traZODone (DESYREL) 100 MG tablet, Take 100 mg by mouth at bedtime as needed for sleep. , Disp: , Rfl: 2 .  warfarin (COUMADIN) 2.5 MG tablet, Take 1-1.5 tablets (2.5-3.75 mg total) by mouth daily. Take 7.5mg  (3 tablets) for the next 3 days (today, Saturday and Sunday). On Monday check INR and resume your normal dosing as noted below.  Take 3.75 mg (1.5 tablets) on Sun, Tues, Weds, Thurs, Sat & Take 2.5 mg (1 tablet) on Mon & Fri (Patient taking differently: Take 5 mg by mouth daily at 6 PM. Checked monthly,  Dose may change accordingly to INR), Disp: 30 tablet, Rfl: 0 .  Cholecalciferol (VITAMIN D) 2000 units tablet, Take 2,000 Units by mouth daily., Disp: , Rfl:  .  lisinopril (PRINIVIL,ZESTRIL) 40 MG tablet, Take 1 tablet (40 mg total) by mouth daily., Disp: 90 tablet, Rfl: 3  Current Facility-Administered Medications:  .  omalizumab [name]) injection 375 mg, 375 mg, Subcutaneous, Q14 Days, Simonne Maffucci B, MD, 375 mg at 01/11/17 [id number]  Social History   Tobacco Use  Smoking Status Former Smoker  . Packs/day: 0.20  . Years: 25.00  . Pack years: 5.00  . Types: Cigarettes  . Quit date: 07/06/1966  . Years since quitting: 52.8  Smokeless Tobacco Never Used    Allergies  Allergen Reactions  . Alphagan [Brimonidine] Other (See Comments)    Burning sensation  . Apraclonidine Other (See Comments)    Pain, brow pain, tender, not able to tolerate  . Biaxin [Clarithromycin]     Unknown, per pt   . Brovana [Arformoterol] Other (See Comments)    Makes my heart race  . Budesonide Other (See Comments)    Makes my heart race  . Cefdinir     "FEEL HORRIBLE"  . Cephalexin Itching  . Cephalexin Nausea Only  . Cifenline Itching  . Clonidine Other (See Comments)    unknown  .  Clonidine Derivatives Other (See Comments)    REACTION: unknown  . Dorzolamide Other (See Comments)    Redness  . Iodinated Diagnostic Agents   . Iohexol Other (See Comments)     Desc: NOTES FROM PRIOR CT STATES PRE MEDICATION PRIOR TO OMNIPAQUE ENHANCED CT   . Nsaids Other (See Comments)    Unknown  . Penicillins Itching, Swelling and Other (See Comments)    Swelling and edema of ears and rash Swelling to ears  Has patient had a PCN  reaction causing immediate rash, facial/tongue/throat swelling, SOB or lightheadedness with hypotension: {no Has patient had a PCN reaction causing severe rash involving mucus membranes or skin necrosis: {no Has patient had a PCN reaction that required hospitalization no Has patient had a PCN reaction occurring within the last 10 years: no If all of the above answers are "NO", then may proceed with Cephalosporin use.  . Sulfamethoxazole Other (See Comments)    Unknown  . Sulfonamide Derivatives Itching  . Tetracycline Itching and Other (See Comments)    unknown  . Timolol Other (See Comments)    Eye red and irritated   . Vancomycin Other (See Comments)    Turn red  . Zetia [Ezetimibe] Other (See Comments)    Unknown  . Cardizem [Diltiazem Hcl] Rash   Objective:  There were no vitals filed for this visit. There is no height or weight on file to calculate BMI. Constitutional Well developed. Well nourished.  Vascular Dorsalis pedis pulses palpable bilaterally. Posterior tibial pulses palpable bilaterally. Capillary refill normal to all digits.  No cyanosis or clubbing noted. Pedal hair growth normal.  Neurologic Normal speech. Oriented to person, place, and time. Protective sensation absent  Dermatologic  left submet 3 wound fibrogranular base no hyperkeratotic periwound no clinical signs of surrounding infection including frank pus.  No erythema.  Orthopedic: No pain to palpation either foot.   Radiographs: Hardware is loose with second  metatarsal screw in the interspace.  No hardware is exposed to outside of the skin.  Severe osteopenia noted to all of her metatarsal especially the heads.  Multiple joints with arthritis noted as well. Assessment:   1. Pain in left foot   2. Ulcer of left foot with fat layer exposed ([place])    Plan:  Patient was evaluated and treated and all questions answered.  Ulcer submet 3 left foot -Debridement as below. -Dressed with triple antibiotic and a Band-Aid, DSD. -Continue off-loading with surgical shoe. -I explained to the patient that aggressive offloading is the key to having this wound heal.  She stated that she understood that.  I recommended that she offload the ulcer for now with flat surgical shoe.  She would be a good candidate for orthotics to continue offloading all the other pressure points including left submet 3. I will have the patient follow-up with [facility] for custom-made orthotics for the left foot and right foot with offloading of the left submet 3 wound. I explained to the patient the etiology and all the treatment options available to to healing this wound.  Given her age she is not a good surgical candidate to remove the screw that has displaced.  Return in about 2 weeks (around 05/12/2019).

## 2019-05-10 ENCOUNTER — Encounter (HOSPITAL_BASED_OUTPATIENT_CLINIC_OR_DEPARTMENT_OTHER): Payer: Medicare HMO | Admitting: Physician Assistant

## 2019-05-15 ENCOUNTER — Ambulatory Visit (INDEPENDENT_AMBULATORY_CARE_PROVIDER_SITE_OTHER): Payer: Medicare HMO | Admitting: Orthotics

## 2019-05-15 ENCOUNTER — Ambulatory Visit: Payer: Medicare HMO | Admitting: Podiatry

## 2019-05-15 ENCOUNTER — Other Ambulatory Visit: Payer: Self-pay

## 2019-05-15 DIAGNOSIS — L97522 Non-pressure chronic ulcer of other part of left foot with fat layer exposed: Secondary | ICD-10-CM | POA: Diagnosis not present

## 2019-05-15 NOTE — Progress Notes (Signed)
Patient was seen today for offloading painful plantar fibromas/keratomas/sub met ulcer.  Area of concerned was marked and patient was scanned/cast to offload the keratoma/fibroma.  A LW accomodative device will be fabricated for the patient with appropriate offloads.

## 2019-05-19 ENCOUNTER — Ambulatory Visit: Payer: Medicare HMO | Admitting: Podiatry

## 2019-05-19 ENCOUNTER — Other Ambulatory Visit: Payer: Medicare HMO | Admitting: Orthotics

## 2019-05-29 ENCOUNTER — Other Ambulatory Visit: Payer: Self-pay | Admitting: Cardiology

## 2019-05-29 ENCOUNTER — Other Ambulatory Visit: Payer: Self-pay | Admitting: Primary Care

## 2019-06-12 ENCOUNTER — Ambulatory Visit: Payer: Medicare HMO | Admitting: Orthotics

## 2019-06-12 ENCOUNTER — Other Ambulatory Visit: Payer: Self-pay

## 2019-06-12 DIAGNOSIS — L97522 Non-pressure chronic ulcer of other part of left foot with fat layer exposed: Secondary | ICD-10-CM

## 2019-06-12 DIAGNOSIS — M79672 Pain in left foot: Secondary | ICD-10-CM

## 2019-06-12 NOTE — Progress Notes (Signed)
patietnb came in to p/u f/o however did not wear appropriate shoes; her daughter was taking her to [employer]

## 2019-07-13 ENCOUNTER — Other Ambulatory Visit: Payer: Self-pay | Admitting: Cardiology

## 2019-07-13 ENCOUNTER — Other Ambulatory Visit: Payer: Self-pay | Admitting: Primary Care

## 2019-07-19 ENCOUNTER — Other Ambulatory Visit: Payer: Self-pay | Admitting: Pulmonary Disease

## 2019-07-26 NOTE — Progress Notes (Signed)
Virtual Visit via Telephone Note  I connected with [name] on 07/27/19 at  9:00 AM EST by telephone and verified that I am speaking with the correct person using two identifiers.  Location: Patient: Home Provider: [facility] - [id number], [place], [place], [place] [address]   I discussed the limitations, risks, security and privacy concerns of performing an evaluation and management service by telephone and the availability of in person appointments. I also discussed with the patient that there may be a patient responsible charge related to this service. The patient expressed understanding and agreed to proceed.  Patient consented to consult via telephone: Yes People present and their role in pt care: Pt     History of Present Illness:  86 year old female former smoker followed in our office for asthma COPD overlap syndrome  Past medical history: Nasal polyps, allergic rhinitis, CAD, A. fib, hyperlipidemia, hypertension, anxiety, glaucoma, depression, Smoking history: Former smoker.  Quit 1968.  5-pack-year smoking history Maintenance:  Patient of Dr. Lake Bells  Synopsis: Former patient of Dr. Gwenette Greet with asthma and COPD.  She started smoking as a child and smoked until age 60, but she smoked only about 1 cigarette per week.  She started having lung problems in 2000. No change with singulair or higher dose advair. On Xolair with good clinical response >> d/ced 08/2012 due to expense.  +nasal polyps per ENT evaluation >> [place].  Recommended surgery but pt declined. On topical nasal steroids.   Was treated with Xolair again in 2017 through 2018 and did quite well without exacerbations but stopped once again because of cost concerns.  Chief complaint: Medication question  86 year old female former smoker followed in our office for likely asthma COPD overlap syndrome.  Patient was last evaluated in January/2020 by Dr. Lake Bells.  Patient has a long  history of Xolair injections.  Patient routinely has had to stop Xolair injections due to cost.  Patient was thankfully able to receive Xolair injections directly from the pharmaceutical company in 2020.  Unfortunately then her Xolair injections were stopped in May/2020 due to the ongoing Covid pandemic.  Her granddaughter requested a televisit today to further discuss this and hopefully restart Xolair injections.  Patient is also having increased fatigue and shortness of breath and occasional wheezing.  They are requesting a prednisone taper.  Her last prednisone taper was in February/2019.  Patient continues to be maintained on Breo Ellipta 200, Singulair.  Patient has received her flu vaccine.  Patient is scheduled to receive the Covid vaccine.  Patient is a former patient of Dr. Anastasia Pall will need to be established with a new pulmonologist in our practice.   Observations/Objective:  PFT PFT's 2009:  FEV1 1.23 (64%), ratio 57, DLCO 67%. January 2020 pulmonary function testing ratio 60%, FEV1 0.98 L 57% predicted, total lung capacity 4.7 L 87% predicted, DLCO 13.24 mL 49% predicted  Labs: [facility] 2006:  IgE 855, multiple significant allergens. 06/2015 IgE 872 kU/L 06/2018 IgE 595 kU/L Elevated Eos on CBC 08/2014  Social History   Tobacco Use  Smoking Status Former Smoker  . Packs/day: 0.20  . Years: 25.00  . Pack years: 5.00  . Types: Cigarettes  . Quit date: 07/06/1966  . Years since quitting: 53.0  Smokeless Tobacco Never Used   Immunization History  Administered Date(s) Administered  . Influenza Split 04/08/2011, 04/05/2012  . Influenza Whole 05/05/2006, 04/18/2009, 04/23/2010, 04/06/2011  . Influenza, High Dose Seasonal PF 04/25/2013, 03/27/2016, 04/23/2017, 05/13/2018, 06/06/2019  .  Influenza,inj,Quad PF,6+ Mos 06/05/2014, 06/07/2015  . Influenza,trivalent, recombinat, inj, PF 07/06/2009, 04/05/2013  . Pneumococcal Conjugate-13 07/22/2010  . Pneumococcal Polysaccharide-23  07/06/2004  . Tdap 07/06/2009      Assessment and Plan:  Asthma-COPD overlap syndrome ([place]) Plan: We will work with our Biologics team to get Xolair resumed Continue Breo Ellipta 200 Prednisone taper today We will schedule follow-up visit to establish care with Dr. Shearon Stalls  Healthcare maintenance Patient already received the flu vaccine  Patient planning on receiving the Covid vaccine she is scheduled to receive in February/2021   Follow Up Instructions:  Return in about 6 weeks (around 09/07/2019), or if symptoms worsen or fail to improve, for Follow up with Dr. Shearon Stalls - 49min - former BQ pt .   I discussed the assessment and treatment plan with the patient. The patient was provided an opportunity to ask questions and all were answered. The patient agreed with the plan and demonstrated an understanding of the instructions.   The patient was advised to call back or seek an in-person evaluation if the symptoms worsen or if the condition fails to improve as anticipated.  I provided 24 minutes of non-face-to-face time during this encounter.   Janice Rinne, NP

## 2019-07-27 ENCOUNTER — Other Ambulatory Visit: Payer: Self-pay

## 2019-07-27 ENCOUNTER — Ambulatory Visit (INDEPENDENT_AMBULATORY_CARE_PROVIDER_SITE_OTHER): Payer: Medicare HMO | Admitting: Pulmonary Disease

## 2019-07-27 ENCOUNTER — Telehealth: Payer: Self-pay | Admitting: Pulmonary Disease

## 2019-07-27 ENCOUNTER — Encounter: Payer: Self-pay | Admitting: Pulmonary Disease

## 2019-07-27 DIAGNOSIS — Z0000 Encounter for general adult medical examination without abnormal findings: Secondary | ICD-10-CM | POA: Insufficient documentation

## 2019-07-27 DIAGNOSIS — R768 Other specified abnormal immunological findings in serum: Secondary | ICD-10-CM | POA: Diagnosis not present

## 2019-07-27 DIAGNOSIS — J449 Chronic obstructive pulmonary disease, unspecified: Secondary | ICD-10-CM

## 2019-07-27 DIAGNOSIS — J309 Allergic rhinitis, unspecified: Secondary | ICD-10-CM | POA: Diagnosis not present

## 2019-07-27 DIAGNOSIS — R7689 Other specified abnormal immunological findings in serum: Secondary | ICD-10-CM | POA: Insufficient documentation

## 2019-07-27 MED ORDER — PREDNISONE 10 MG PO TABS: ORAL_TABLET | ORAL | 0 refills | Status: DC

## 2019-07-27 NOTE — Telephone Encounter (Signed)
07/27/2019 [id number]  Spoke with Lattie Haw regarding this patient.  Had a televisit with the patient and spoke with her granddaughter.  Patient would like to resume Xolair injections.  Her last Xolair injection I believe was in May/2020.  This was placed on hold due to the ongoing Covid pandemic.  Patient is also unsteady on her feet even with walking with a walker so granddaughter would like to accompany patient to ejection appointments.  I reviewed our Covid policies as well as the fact that she would need to wear a mask and be afebrile.  Granddaughter complies to this. Granddaughter also fine with simply walking the patient to the exam room or injection pool and then waiting in the lobby.    They would like to get the patient restarted on Xolair.  I agree.  Routing to the injection pool for follow-up so that way we can get the paperwork completed or get the patient scheduled to receive Xolair again.  Patient is a former Dr. Lake Bells patient.  We will get the patient established with Dr. Shearon Stalls for future management.  Wyn Quaker, FNP

## 2019-07-27 NOTE — Assessment & Plan Note (Signed)
Patient already received the flu vaccine  Patient planning on receiving the Covid vaccine she is scheduled to receive in February/2021

## 2019-07-27 NOTE — Telephone Encounter (Signed)
New Xolair enrollment started.  Will contact Patient and Patient Granddaughter for signature. Patient does still have Xolair in our refrigerator that has not expired. Will reach out to get Patient restarted.

## 2019-07-27 NOTE — Assessment & Plan Note (Signed)
Plan: We will work with our Biologics team to get Xolair resumed Continue Breo Ellipta 200 Prednisone taper today We will schedule follow-up visit to establish care with Dr. Shearon Stalls

## 2019-07-27 NOTE — Patient Instructions (Signed)
You were seen today by [name], NP  for:   1. Asthma-COPD overlap syndrome ([place]) 2. Allergic rhinitis, unspecified seasonality, unspecified trigger 3. Elevated IgE level  Prednisone 10mg  tablet  >>>4 tabs for 2 days, then 3 tabs for 2 days, 2 tabs for 2 days, then 1 tab for 2 days, then stop >>>take with food  >>>take in the morning   Breo Ellipta 200 >>> Take 1 puff daily in the morning right when you wake up >>>Rinse your mouth out after use >>>This is a daily maintenance inhaler, NOT a rescue inhaler >>>Contact our office if you are having difficulties affording or obtaining this medication >>>It is important for you to be able to take this daily and not miss any doses   Only use your albuterol as a rescue medication to be used if you can't catch your breath by resting or doing a relaxed purse lip breathing pattern.  - The less you use it, the better it will work when you need it. - Ok to use up to 2 puffs  every 4 hours if you must but call for immediate appointment if use goes up over your usual need - Don't leave home without it !!  (think of it like the spare tire for your car)   We will work with our office to get you restarted on Xolair    4. Healthcare maintenance  Great job already receiving your flu vaccine  Complete your Covid vaccine injection as scheduled   We recommend today:   Meds ordered this encounter  Medications  . DISCONTD: predniSONE (DELTASONE) 10 MG tablet    Sig: 4 tabs for 2 days, then 3 tabs for 2 days, 2 tabs for 2 days, then 1 tab for 2 days, then stop    Dispense:  20 tablet    Refill:  0  . predniSONE (DELTASONE) 10 MG tablet    Sig: 4 tabs for 2 days, then 3 tabs for 2 days, 2 tabs for 2 days, then 1 tab for 2 days, then stop    Dispense:  20 tablet    Refill:  0    Follow Up:    No follow-ups on file.   Please do your part to reduce the spread of COVID-19:      Reduce your risk of any infection  and COVID19 by using  the similar precautions used for avoiding the common cold or flu:  [name] Wash your hands often with soap and warm water for at least 20 seconds.  If soap and water are not readily available, use an alcohol-based hand sanitizer with at least 60% alcohol.  . If coughing or sneezing, cover your mouth and nose by coughing or sneezing into the elbow areas of your shirt or coat, into a tissue or into your sleeve (not your hands). Langley Gauss A MASK when in public  . Avoid shaking hands with others and consider head nods or verbal greetings only. . Avoid touching your eyes, nose, or mouth with unwashed hands.  . Avoid close contact with people who are sick. . Avoid places or events with large numbers of people in one location, like concerts or sporting events. . If you have some symptoms but not all symptoms, continue to monitor at home and seek medical attention if your symptoms worsen. . If you are having a medical emergency, call 911.   ADDITIONAL HEALTHCARE OPTIONS FOR PATIENTS  [facility] / e-Visit: [email or web address]  [facility]: [facility]: [phone]                   [facility]: [phone]     It is flu season:   >>> Best ways to protect herself from the flu: Receive the yearly flu vaccine, practice good hand hygiene washing with soap and also using hand sanitizer when available, eat a nutritious meals, get adequate rest, hydrate appropriately   Please contact the office if your symptoms worsen or you have concerns that you are not improving.   Thank you for choosing [facility] for your healthcare, and for allowing [place] to partner with you on your healthcare journey. I am thankful to be able to provide care to you today.   Wyn Quaker FNP-C

## 2019-07-28 NOTE — Telephone Encounter (Signed)
Called and spoke with Colletta Maryland, Patient's Granddaughter.  Colletta Maryland stated to only call [phone] for anything concerning Patient.  Colletta Maryland thought they had to wait until after consult with Shearon Stalls. Explained Brian,NP would like her started back on Xolair. Patient does have Xoalir in office refrigerator, that has not expired. Explained Brian,NP would like Patient to be observed for 2 hours after first injection and Patient will need to bring Epipen. Colletta Maryland stated they had several appointments next week and she would call back to schedule Xolair injections.

## 2019-08-03 ENCOUNTER — Ambulatory Visit: Payer: Medicare HMO

## 2019-08-08 ENCOUNTER — Encounter (HOSPITAL_COMMUNITY): Payer: Self-pay | Admitting: Emergency Medicine

## 2019-08-08 ENCOUNTER — Emergency Department (HOSPITAL_BASED_OUTPATIENT_CLINIC_OR_DEPARTMENT_OTHER): Payer: Medicare HMO

## 2019-08-08 ENCOUNTER — Emergency Department (HOSPITAL_COMMUNITY): Payer: Medicare HMO

## 2019-08-08 ENCOUNTER — Other Ambulatory Visit: Payer: Self-pay

## 2019-08-08 ENCOUNTER — Observation Stay (HOSPITAL_COMMUNITY)
Admission: EM | Admit: 2019-08-08 | Discharge: 2019-08-09 | Disposition: A | Discharge status: Home / Self Care | Payer: Medicare HMO | Attending: Internal Medicine | Admitting: Internal Medicine

## 2019-08-08 DIAGNOSIS — Z8673 Personal history of transient ischemic attack (TIA), and cerebral infarction without residual deficits: Secondary | ICD-10-CM | POA: Insufficient documentation

## 2019-08-08 DIAGNOSIS — I4819 Other persistent atrial fibrillation: Secondary | ICD-10-CM | POA: Insufficient documentation

## 2019-08-08 DIAGNOSIS — Z20822 Contact with and (suspected) exposure to covid-19: Secondary | ICD-10-CM | POA: Diagnosis not present

## 2019-08-08 DIAGNOSIS — Z7951 Long term (current) use of inhaled steroids: Secondary | ICD-10-CM | POA: Insufficient documentation

## 2019-08-08 DIAGNOSIS — R791 Abnormal coagulation profile: Secondary | ICD-10-CM | POA: Insufficient documentation

## 2019-08-08 DIAGNOSIS — F419 Anxiety disorder, unspecified: Secondary | ICD-10-CM | POA: Diagnosis not present

## 2019-08-08 DIAGNOSIS — M79609 Pain in unspecified limb: Secondary | ICD-10-CM

## 2019-08-08 DIAGNOSIS — H409 Unspecified glaucoma: Secondary | ICD-10-CM | POA: Insufficient documentation

## 2019-08-08 DIAGNOSIS — Z882 Allergy status to sulfonamides status: Secondary | ICD-10-CM | POA: Insufficient documentation

## 2019-08-08 DIAGNOSIS — I2510 Atherosclerotic heart disease of native coronary artery without angina pectoris: Secondary | ICD-10-CM | POA: Diagnosis not present

## 2019-08-08 DIAGNOSIS — Z85820 Personal history of malignant melanoma of skin: Secondary | ICD-10-CM | POA: Insufficient documentation

## 2019-08-08 DIAGNOSIS — M1990 Unspecified osteoarthritis, unspecified site: Secondary | ICD-10-CM | POA: Diagnosis not present

## 2019-08-08 DIAGNOSIS — Z7901 Long term (current) use of anticoagulants: Secondary | ICD-10-CM | POA: Diagnosis not present

## 2019-08-08 DIAGNOSIS — Z79899 Other long term (current) drug therapy: Secondary | ICD-10-CM | POA: Insufficient documentation

## 2019-08-08 DIAGNOSIS — L03116 Cellulitis of left lower limb: Secondary | ICD-10-CM | POA: Diagnosis not present

## 2019-08-08 DIAGNOSIS — Z87891 Personal history of nicotine dependence: Secondary | ICD-10-CM | POA: Insufficient documentation

## 2019-08-08 DIAGNOSIS — Z66 Do not resuscitate: Secondary | ICD-10-CM | POA: Diagnosis not present

## 2019-08-08 DIAGNOSIS — R233 Spontaneous ecchymoses: Secondary | ICD-10-CM | POA: Insufficient documentation

## 2019-08-08 DIAGNOSIS — J449 Chronic obstructive pulmonary disease, unspecified: Secondary | ICD-10-CM | POA: Insufficient documentation

## 2019-08-08 DIAGNOSIS — I10 Essential (primary) hypertension: Secondary | ICD-10-CM | POA: Diagnosis not present

## 2019-08-08 DIAGNOSIS — Z955 Presence of coronary angioplasty implant and graft: Secondary | ICD-10-CM | POA: Insufficient documentation

## 2019-08-08 DIAGNOSIS — I4891 Unspecified atrial fibrillation: Secondary | ICD-10-CM

## 2019-08-08 DIAGNOSIS — Z880 Allergy status to penicillin: Secondary | ICD-10-CM | POA: Diagnosis not present

## 2019-08-08 DIAGNOSIS — Z7952 Long term (current) use of systemic steroids: Secondary | ICD-10-CM | POA: Diagnosis not present

## 2019-08-08 DIAGNOSIS — D72829 Elevated white blood cell count, unspecified: Secondary | ICD-10-CM | POA: Diagnosis not present

## 2019-08-08 DIAGNOSIS — Z888 Allergy status to other drugs, medicaments and biological substances status: Secondary | ICD-10-CM | POA: Insufficient documentation

## 2019-08-08 DIAGNOSIS — R509 Fever, unspecified: Secondary | ICD-10-CM | POA: Insufficient documentation

## 2019-08-08 DIAGNOSIS — M79605 Pain in left leg: Secondary | ICD-10-CM | POA: Diagnosis not present

## 2019-08-08 DIAGNOSIS — Z881 Allergy status to other antibiotic agents status: Secondary | ICD-10-CM | POA: Insufficient documentation

## 2019-08-08 DIAGNOSIS — L8990 Pressure ulcer of unspecified site, unspecified stage: Secondary | ICD-10-CM | POA: Insufficient documentation

## 2019-08-08 DIAGNOSIS — Z886 Allergy status to analgesic agent status: Secondary | ICD-10-CM | POA: Insufficient documentation

## 2019-08-08 DIAGNOSIS — L0390 Cellulitis, unspecified: Secondary | ICD-10-CM | POA: Diagnosis present

## 2019-08-08 DIAGNOSIS — Z8249 Family history of ischemic heart disease and other diseases of the circulatory system: Secondary | ICD-10-CM | POA: Insufficient documentation

## 2019-08-08 LAB — COMPREHENSIVE METABOLIC PANEL
ALT: 29 U/L (ref 0–44)
AST: 28 U/L (ref 15–41)
Albumin: 3.4 g/dL — ABNORMAL LOW (ref 3.5–5.0)
Alkaline Phosphatase: 52 U/L (ref 38–126)
Anion gap: 11 (ref 5–15)
BUN: 25 mg/dL — ABNORMAL HIGH (ref 8–23)
CO2: 23 mmol/L (ref 22–32)
Calcium: 8.5 mg/dL — ABNORMAL LOW (ref 8.9–10.3)
Chloride: 101 mmol/L (ref 98–111)
Creatinine, Ser: 0.94 mg/dL (ref 0.44–1.00)
GFR calc Af Amer: >60 mL/min (ref >60)
GFR calc non Af Amer: 52 mL/min — ABNORMAL LOW (ref >60)
Glucose, Bld: 110 mg/dL — ABNORMAL HIGH (ref 70–99)
Potassium: 4.5 mmol/L (ref 3.5–5.1)
Sodium: 135 mmol/L (ref 135–145)
Total Bilirubin: 1.5 mg/dL — ABNORMAL HIGH (ref 0.3–1.2)
Total Protein: 6.4 g/dL — ABNORMAL LOW (ref 6.5–8.1)

## 2019-08-08 LAB — CBC
HCT: 34 % — ABNORMAL LOW (ref 36.0–46.0)
Hemoglobin: 11.3 g/dL — ABNORMAL LOW (ref 12.0–15.0)
MCH: 31 pg (ref 26.0–34.0)
MCHC: 33.2 g/dL (ref 30.0–36.0)
MCV: 93.2 fL (ref 80.0–100.0)
Platelets: UNDETERMINED 10*3/uL (ref 150–400)
RBC: 3.65 MIL/uL — ABNORMAL LOW (ref 3.87–5.11)
RDW: 15.1 % (ref 11.5–15.5)
WBC: 20.1 10*3/uL — ABNORMAL HIGH (ref 4.0–10.5)
nRBC: 0 % (ref 0.0–0.2)

## 2019-08-08 LAB — PROTIME-INR
INR: >10 (ref 0.8–1.2)
Prothrombin Time: 82.7 seconds — ABNORMAL HIGH (ref 11.4–15.2)

## 2019-08-08 LAB — RESPIRATORY PANEL BY RT PCR (FLU A&B, COVID)
Influenza A by PCR: NEGATIVE
Influenza B by PCR: NEGATIVE
SARS Coronavirus 2 by RT PCR: NEGATIVE

## 2019-08-08 LAB — LACTIC ACID, PLASMA: Lactic Acid, Venous: 1 mmol/L (ref 0.5–1.9)

## 2019-08-08 LAB — D-DIMER, QUANTITATIVE: D-Dimer, Quant: 0.43 ug/mL-FEU (ref 0.00–0.50)

## 2019-08-08 MED ORDER — ACETAMINOPHEN 325 MG PO TABS
650.0000 mg | ORAL_TABLET | Freq: Four times a day (QID) | ORAL | Status: DC | PRN
  Administered 2019-08-08: 650 mg via ORAL
  Filled 2019-08-08: qty 2

## 2019-08-08 MED ORDER — LORATADINE 10 MG PO TABS
10.0000 mg | ORAL_TABLET | Freq: Every day | ORAL | Status: DC
  Administered 2019-08-08 – 2019-08-09 (×2): 10 mg via ORAL
  Filled 2019-08-08 (×2): qty 1

## 2019-08-08 MED ORDER — VANCOMYCIN HCL 1250 MG/250ML IV SOLN
1250.0000 mg | Freq: Once | INTRAVENOUS | Status: DC
  Filled 2019-08-08: qty 250

## 2019-08-08 MED ORDER — AMLODIPINE BESYLATE 10 MG PO TABS
10.0000 mg | ORAL_TABLET | Freq: Every day | ORAL | Status: DC
  Administered 2019-08-08 – 2019-08-09 (×2): 10 mg via ORAL
  Filled 2019-08-08 (×2): qty 1

## 2019-08-08 MED ORDER — ALPRAZOLAM 0.5 MG PO TABS: 0.5000 mg | ORAL_TABLET | Freq: Two times a day (BID) | ORAL | Status: DC | PRN

## 2019-08-08 MED ORDER — SODIUM CHLORIDE 0.9 % IV SOLN
1.0000 g | INTRAVENOUS | Status: DC
  Administered 2019-08-08: 1 g via INTRAVENOUS
  Filled 2019-08-08: qty 10

## 2019-08-08 MED ORDER — WARFARIN - PHARMACIST DOSING INPATIENT: Freq: Every day | Status: DC

## 2019-08-08 MED ORDER — METOPROLOL TARTRATE 25 MG PO TABS: 25.0000 mg | ORAL_TABLET | Freq: Two times a day (BID) | ORAL | Status: DC

## 2019-08-08 MED ORDER — SODIUM CHLORIDE 0.9 % IV BOLUS
1000.0000 mL | Freq: Once | INTRAVENOUS | Status: AC
  Administered 2019-08-08: 1000 mL via INTRAVENOUS

## 2019-08-08 MED ORDER — METOPROLOL TARTRATE 25 MG PO TABS
25.0000 mg | ORAL_TABLET | Freq: Once | ORAL | Status: AC
  Administered 2019-08-08: 25 mg via ORAL
  Filled 2019-08-08: qty 1

## 2019-08-08 MED ORDER — CITALOPRAM HYDROBROMIDE 20 MG PO TABS
10.0000 mg | ORAL_TABLET | Freq: Every day | ORAL | Status: DC
  Administered 2019-08-08 – 2019-08-09 (×2): 10 mg via ORAL
  Filled 2019-08-08 (×2): qty 1

## 2019-08-08 MED ORDER — MONTELUKAST SODIUM 10 MG PO TABS
10.0000 mg | ORAL_TABLET | Freq: Every day | ORAL | Status: DC
  Administered 2019-08-08: 10 mg via ORAL
  Filled 2019-08-08: qty 1

## 2019-08-08 MED ORDER — FLUTICASONE FUROATE-VILANTEROL 200-25 MCG/INH IN AEPB
1.0000 | INHALATION_SPRAY | Freq: Every day | RESPIRATORY_TRACT | Status: DC
  Administered 2019-08-09: 1 via RESPIRATORY_TRACT
  Filled 2019-08-08: qty 28

## 2019-08-08 MED ORDER — FLUTICASONE PROPIONATE 50 MCG/ACT NA SUSP
1.0000 | Freq: Every day | NASAL | Status: DC | PRN
  Filled 2019-08-08: qty 16

## 2019-08-08 MED ORDER — ALPRAZOLAM 0.25 MG PO TABS
0.5000 mg | ORAL_TABLET | Freq: Once | ORAL | Status: AC
  Administered 2019-08-08: 0.5 mg via ORAL
  Filled 2019-08-08: qty 2

## 2019-08-08 MED ORDER — PHYTONADIONE 5 MG PO TABS
2.5000 mg | ORAL_TABLET | Freq: Once | ORAL | Status: AC
  Administered 2019-08-08: 2.5 mg via ORAL
  Filled 2019-08-08: qty 1

## 2019-08-08 MED ORDER — VANCOMYCIN HCL 1250 MG/250ML IV SOLN: 1250.0000 mg | INTRAVENOUS | Status: DC

## 2019-08-08 MED ORDER — METOPROLOL TARTRATE 25 MG PO TABS
12.5000 mg | ORAL_TABLET | Freq: Two times a day (BID) | ORAL | Status: DC
  Administered 2019-08-09: 12.5 mg via ORAL
  Filled 2019-08-08: qty 1

## 2019-08-08 NOTE — Plan of Care (Signed)

## 2019-08-08 NOTE — ED Provider Notes (Signed)
[facility] Emergency Department Provider Note MRN:  [id number]  Arrival date & time: 08/08/19     Chief Complaint   Leg Pain and Atrial Fibrillation   History of Present Illness   [name] is a 86 y.o. year-old female with a history of CAD, COPD, TIA presenting to the ED with chief complaint of leg pain and A. fib.  Patient endorsing left leg pain, localized mostly to the left calf since last night.  Reports fever last night.  Denies chest pain or shortness of breath, no cough, no sick contacts, no abdominal pain.  EMS noted A. fib up to 150.  Symptoms constant, mild to moderate in severity, worse with palpation.  Review of Systems  A complete 10 system review of systems was obtained and all systems are negative except as noted in the HPI and PMH.   Patient's Health History    Past Medical History:  Diagnosis Date  . Anxiety   . Arthritis   . CAD (coronary artery disease) 2007   a. 1997 s/p PCI of diagonal.  . Constipation   . COPD/Asthma   . Diverticulosis   . Esophageal reflux   . Family history of adverse reaction to anesthesia    daughter had trouble breathing after surgery, had to be reintubated  . Glaucoma   . HTN (hypertension), benign   . Hyperlipidemia   . Malignant melanoma ([place])    a. 09/2012 s/p resection.  . Neuropathy    lower legs  . PAF (paroxysmal atrial fibrillation) (HCC)    a. intolerant to beta blockers due to asthma and intolerant to CCB due to rash;  b. CHA2DS2VASc = 6-->chronic coumadin;  c. 03/2014 Echo: EF 60-65%, Gr 1 DD, triv AI, mildly dil RV, PASP 70mmhg.  [name] TIA (transient ischemic attack)    a. 02/2011 - chronic coumadin in setting of PAF.  [name] Urinary, incontinence, stress female     Past Surgical History:  Procedure Laterality Date  . ABDOMINAL HYSTERECTOMY    . APPENDECTOMY    . BACK SURGERY     lumbar laminectomy  . CHOLECYSTECTOMY    . COLONOSCOPY    . CORONARY ANGIOPLASTY WITH STENT PLACEMENT     . EYE SURGERY    . NASAL SINUS SURGERY    . VESICOVAGINAL FISTULA CLOSURE W/ TAH    . WEIL OSTEOTOMY Left 06/12/2016   Procedure: Left Chevron and Aiken Left [name], Weil Osteotomy 2nd and 3rd Metatarsal;  Surgeon: Newt Minion, MD;  Location: [facility];  Service: Orthopedics;  Laterality: Left;    Family History  Problem Relation Age of Onset  . Heart disease Father   . Hemachromatosis Father   . Heart attack Father   . Colon cancer Mother   . Peripheral vascular disease Mother   . Peripheral vascular disease Sister   . Cancer Sister   . Lymphoma Sister   . Heart attack Brother   . Heart disease Brother     Social History   Socioeconomic History  . Marital status: Divorced    Spouse name: Not on file  . Number of children: Not on file  . Years of education: Not on file  . Highest education level: Not on file  Occupational History  . 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   Tobacco Use  . Smoking status: Former Smoker    Packs/day: 0.20    Years: 25.00  Pack years: 5.00    Types: Cigarettes    Quit date: 07/06/1966    Years since quitting: 53.1  . Smokeless tobacco: Never Used  Substance and Sexual Activity  . Alcohol use: No    Alcohol/week: 0.0 standard drinks  . Drug use: No  . Sexual activity: Not on file  Other Topics Concern  . Not on file  Social History Narrative   Lives in [place] with her dog.  She is very independent.   Social Determinants of Health   Financial Resource Strain:   . Difficulty of Paying Living Expenses: Not on file  Food Insecurity:   . Worried About Charity fundraiser in the Last Year: Not on file  . Ran Out of Food in the Last Year: Not on file  Transportation Needs:   . Lack of Transportation (Medical): Not on file  . Lack of Transportation (Non-Medical): Not on file  Physical Activity:   . Days of Exercise per Week: Not on file  . Minutes of Exercise per Session: Not on file  Stress:   .  Feeling of Stress : Not on file  Social Connections:   . Frequency of Communication with Friends and Family: Not on file  . Frequency of Social Gatherings with Friends and Family: Not on file  . Attends Religious Services: Not on file  . Active Member of Clubs or Organizations: Not on file  . Attends Archivist Meetings: Not on file  . Marital Status: Not on file  Intimate Partner Violence:   . Fear of Current or Ex-Partner: Not on file  . Emotionally Abused: Not on file  . Physically Abused: Not on file  . Sexually Abused: Not on file     Physical Exam   Vitals:   08/08/19 1030 08/08/19 1045  BP: 124/68 114/69  Pulse:  (!) 129  Resp: (!) 24 (!) 25  Temp:    SpO2: 98% 98%    CONSTITUTIONAL: Well-appearing, NAD NEURO:  Alert and oriented x 3, no focal deficits EYES:  eyes equal and reactive ENT/NECK:  no LAD, no JVD CARDIO: Tachycardic rate, well-perfused, normal S1 and S2 PULM:  CTAB no wheezing or rhonchi GI/GU:  normal bowel sounds, non-distended, non-tender MSK/SPINE:  No gross deformities, no edema SKIN: Mild erythema and tenderness palpation to the left calf PSYCH:  Appropriate speech and behavior  *Additional and/or pertinent findings included in MDM below  Diagnostic and Interventional Summary    EKG Interpretation  Date/Time:  Tuesday August 08 2019 09:00:26 EST Ventricular Rate:  130 PR Interval:    QRS Duration: 110 QT Interval:  297 QTC Calculation: 432 R Axis:   169 Text Interpretation: Atrial fibrillation Right axis deviation Low voltage, extremity leads Probable anterolateral infarct, old ST depression, probably rate related Artifact in lead(s) I II aVR aVL V1 V2 Confirmed by Gerlene Fee [phone]) on 08/08/2019 9:56:53 AM      Cardiac Monitoring Interpretation: Cardiac monitoring was ordered to monitor the patient for dysrhythmia.  I personally interpreted the patient's cardiac monitor while at the bedside. 08/08/2019 11:21 AM atrial  fibrillation with rapid ventricular response  Labs Reviewed  CBC - Abnormal; Notable for the following components:      Result Value   WBC 20.1 (*)    RBC 3.65 (*)    Hemoglobin 11.3 (*)    HCT 34.0 (*)    All other components within normal limits  COMPREHENSIVE METABOLIC PANEL - Abnormal; Notable for the following components:  Glucose, Bld 110 (*)    BUN 25 (*)    Calcium 8.5 (*)    Total Protein 6.4 (*)    Albumin 3.4 (*)    Total Bilirubin 1.5 (*)    GFR calc non Af Amer 52 (*)    All other components within normal limits  PROTIME-INR - Abnormal; Notable for the following components:   Prothrombin Time 82.7 (*)    INR >10.0 (*)    All other components within normal limits  CULTURE, BLOOD (SINGLE)  LACTIC ACID, PLASMA  D-DIMER, QUANTITATIVE (NOT AT [facility])  URINALYSIS, ROUTINE W REFLEX MICROSCOPIC    VAS [place] LOWER EXTREMITY VENOUS (DVT) (ONLY [facility] & [facility])      DG Chest Port 1 View  Final Result      Medications  ALPRAZolam (XANAX) tablet 0.5 mg (has no administration in time range)  sodium chloride 0.9 % bolus 1,000 mL (1,000 mLs Intravenous New Bag/Given 08/08/19 [id number])     Procedures  /  Critical Care .Critical Care Performed by: Maudie Flakes, MD Authorized by: Maudie Flakes, MD   Critical care provider statement:    Critical care time (minutes):  35   Critical care was necessary to treat or prevent imminent or life-threatening deterioration of the following conditions: Atrial fibrillation with rapid ventricular response.   Critical care was time spent personally by me on the following activities:  Discussions with consultants, evaluation of patient's response to treatment, examination of patient, ordering and performing treatments and interventions, ordering and review of laboratory studies, ordering and review of radiographic studies, pulse oximetry, re-evaluation of patient's condition, obtaining history from patient or surrogate and review of old charts    ED  Course and Medical Decision Making  I have reviewed the triage vital signs, the nursing notes, and pertinent available records from the EMR.  Pertinent labs & imaging results that were available during my care of the patient were reviewed by me and considered in my medical decision making (see below for details).     A. fib with RVR in the setting of subjective or reported fever, left leg erythema and tenderness, considering cellulitis versus VTE.  Will provide IV fluids, ultrasound, chest x-ray, labs pending.  Ultrasound is negative for DVT.  D-dimer is negative.  Patient's INR is significantly elevated at greater than 10, however patient shows no signs of bleeding.  Given this constellation of findings, VTE is largely excluded.  Patient has a temperature of 100 degrees, continues to demonstrate A. fib with RVR.  Will provide fluids and antibiotics and admit to hospitalist service.  Barth Kirks. Sedonia Small, [place] [email or web address]  Final Clinical Impressions(s) / ED Diagnoses     ICD-10-CM   1. Atrial fibrillation with RVR (HCC)  I48.91   2. Cellulitis, unspecified cellulitis site  L03.90   3. Elevated INR  R79.1     ED Discharge Orders    None       Discharge Instructions Discussed with and Provided to Patient:   Discharge Instructions   None       Maudie Flakes, MD 08/08/19 1123

## 2019-08-08 NOTE — ED Notes (Signed)
Pt endorses only leg pain at this time

## 2019-08-08 NOTE — ED Notes (Signed)
Got patient undress on the monitor did ekg shown to the er doctor patient is resting with call bell in reach 

## 2019-08-08 NOTE — Progress Notes (Addendum)
ANTICOAGULATION CONSULT NOTE - Initial Consult  Pharmacy Consult for warfarin Indication: atrial fibrillation  Allergies  Allergen Reactions  . Alphagan [Brimonidine] Other (See Comments)    Burning sensation  . Apraclonidine Other (See Comments)    Pain, brow pain, tender, not able to tolerate  . Biaxin [Clarithromycin] Other (See Comments)    Unknown, per pt   . Brovana [Arformoterol] Other (See Comments)    Makes my heart race  . Budesonide Other (See Comments)    Makes my heart race  . Cefdinir Other (See Comments)    "FEEL HORRIBLE"  . Cephalexin Itching  . Cifenline Itching  . Clonidine Other (See Comments)    unknown  . Clonidine Derivatives Other (See Comments)    REACTION: unknown  . Dorzolamide Other (See Comments)    Redness  . Iodinated Diagnostic Agents Other (See Comments)    unknown  . Iohexol Other (See Comments)     Desc: NOTES FROM PRIOR CT STATES PRE MEDICATION PRIOR TO OMNIPAQUE ENHANCED CT   . Nsaids Other (See Comments)    Unknown  . Penicillins Itching, Swelling and Other (See Comments)    Swelling and edema of ears and rash Swelling to ears  Has patient had a PCN reaction causing immediate rash, facial/tongue/throat swelling, SOB or lightheadedness with hypotension: {no Has patient had a PCN reaction causing severe rash involving mucus membranes or skin necrosis: {no Has patient had a PCN reaction that required hospitalization no Has patient had a PCN reaction occurring within the last 10 years: no If all of the above answers are "NO", then may proceed with Cephalosporin use.  . Sulfamethoxazole Other (See Comments)    Unknown  . Sulfonamide Derivatives Itching  . Tetracycline Itching and Other (See Comments)    unknown  . Timolol Other (See Comments)    Eye red and irritated   . Vancomycin Other (See Comments)    Turn red  . Zetia [Ezetimibe] Other (See Comments)    Unknown  . Cardizem [Diltiazem Hcl] Rash    Patient Measurements:     Vital Signs: Temp: 100 F (37.8 C) (02/02 1021) Temp Source: Rectal (02/02 1021) BP: 110/67 (02/02 1330) Pulse Rate: 113 (02/02 1330)  Labs: Recent Labs    08/08/19 0920  HGB 11.3*  HCT 34.0*  PLT PLATELET CLUMPS NOTED ON SMEAR, UNABLE TO ESTIMATE  LABPROT 82.7*  INR >10.0*  CREATININE 0.94    CrCl cannot be calculated (Unknown ideal weight.).   Medical History: Past Medical History:  Diagnosis Date  . Anxiety   . Arthritis   . CAD (coronary artery disease) 2007   a. 1997 s/p PCI of diagonal.  . Constipation   . COPD/Asthma   . Diverticulosis   . Esophageal reflux   . Family history of adverse reaction to anesthesia    daughter had trouble breathing after surgery, had to be reintubated  . Glaucoma   . HTN (hypertension), benign   . Hyperlipidemia   . Malignant melanoma ([place])    a. 09/2012 s/p resection.  . Neuropathy    lower legs  . PAF (paroxysmal atrial fibrillation) (HCC)    a. intolerant to beta blockers due to asthma and intolerant to CCB due to rash;  b. CHA2DS2VASc = 6-->chronic coumadin;  c. 03/2014 Echo: EF 60-65%, Gr 1 DD, triv AI, mildly dil RV, PASP 21mmhg.  [name] TIA (transient ischemic attack)    a. 02/2011 - chronic coumadin in setting of PAF.  [name] Urinary, incontinence, stress  female     Medications:  Scheduled:  . amLODipine  10 mg Oral Daily  . citalopram  10 mg Oral Daily  . fluticasone furoate-vilanterol  1 puff Inhalation Daily  . loratadine  10 mg Oral Daily  . metoprolol tartrate  25 mg Oral Once   Followed by  . metoprolol tartrate  12.5 mg Oral BID  . montelukast  10 mg Oral QHS  . omalizumab  375 mg Subcutaneous Q14 Days   Infusions:  . cefTRIAXone (ROCEPHIN)  IV      Assessment: 86 yo F on warfarin 5 mg daily for afib. INR on admission is above goal >10. Hgb 11.3 and pltc unknown (clumps noted on smear). Order for vitamin K 2.5 mg PO x 1 per MD.  Goal of Therapy:  INR 2-3 Monitor platelets by anticoagulation protocol: Yes    Plan:  Hold warfarin tonight Daily INR Monitor for bleeding  Vertis Kelch, PharmD, [facility] PGY2 Cardiology Pharmacy Resident Phone [phone] 08/08/2019       2:09 PM  Please check [email or web address] for unit-specific pharmacist phone numbers

## 2019-08-08 NOTE — Plan of Care (Signed)

## 2019-08-08 NOTE — H&P (Signed)
Date: 08/08/2019               Patient Name:  [name] MRN: [id number]  DOB: 23-Aug-1923 Age / Sex: 86 y.o., female   PCP: Julian Hy, PA-C         Medical Service: Internal Medicine Teaching Service         Attending Physician: Dr. Aldine Contes, MD    First Contact: Dr. Marva Panda Pager: [phone]  Second Contact: Dr. Sherry Ruffing Pager: [phone]       After Hours (After 5p/  First Contact Pager: [phone]  weekends / holidays): Second Contact Pager: [phone]   Chief Complaint: LLE pain  History of Present Illness: Vandi Guarente is a 86 y.o female with a history of CAD, COPD, TIA, and persistent atrial fibrillation who presented to the emergency department with acute onset left lower extremity leg pain. History was obtained via the patient and through chart review.  Patient struggles with chronic left lower extremity pain; however, last night when going to lay down for bed she noticed an acute worsening. The pain kept her awake most of the night. This morning when she got up from bed the pain was persistent so she asked her granddaughter to bring her to the emergency department. She otherwise has been feeling well without any systemic symptoms including fevers/chills, headaches, visual changes, shortness of breath, chest pain, palpitations, abdominal pain, diarrhea/constipation, dysuria, hematuria, myalgias, arthralgias.  Meds:  Current Facility-Administered Medications for the 08/08/19 encounter [facility] Encounter)  Medication  . omalizumab [name]) injection 375 mg   Current Meds  Medication Sig  . acetaminophen (TYLENOL) 325 MG tablet Take 325 mg by mouth every 6 (six) hours as needed for moderate pain.   [name] albuterol (PROAIR HFA) 108 (90 Base) MCG/ACT inhaler Inhale 1-2 puffs into the lungs every 4 (four) hours as needed for wheezing or shortness of breath.  [name] albuterol (PROVENTIL) (2.5 MG/3ML) 0.083% nebulizer solution Take 3 mLs (2.5 mg total) by  nebulization every 6 (six) hours as needed for wheezing or shortness of breath.  . ALPRAZolam (XANAX) 0.5 MG tablet Take 0.5 mg by mouth 2 (two) times daily as needed for anxiety.  [name] amLODipine (NORVASC) 10 MG tablet TAKE 1 TABLET BY MOUTH EVERY DAY (Patient taking differently: Take 10 mg by mouth daily. )  . cetirizine (ZYRTEC) 10 MG tablet Take 10 mg by mouth daily as needed for allergies.  . citalopram (CELEXA) 10 MG tablet Take 10 mg by mouth daily.  . fluticasone (FLONASE) 50 MCG/ACT nasal spray USE 1 SPRAY IN EACH NOSTRIL ONCE A DAY AS NEEDED FOR ALLERGIES OR RHINITIS (Patient taking differently: Place 1 spray into both nostrils daily as needed for allergies or rhinitis. )  . fluticasone furoate-vilanterol (BREO ELLIPTA) 200-25 MCG/INH AEPB Inhale 1 puff into the lungs daily.  . furosemide (LASIX) 40 MG tablet Take 1 tablet (40 mg total) by mouth daily as needed for fluid.  [name] latanoprost (XALATAN) 0.005 % ophthalmic solution Place 1 drop into both eyes at bedtime.  [name] lisinopril (ZESTRIL) 40 MG tablet TAKE 1 TABLET BY MOUTH EVERY DAY (Patient taking differently: Take 40 mg by mouth daily. )  . montelukast (SINGULAIR) 10 MG tablet TAKE 1 TABLET BY MOUTH EVERYDAY AT BEDTIME (Patient taking differently: Take 10 mg by mouth at bedtime. )  . Multiple Vitamins-Minerals (MULTIVITAMIN & MINERAL PO) Take 1 tablet by mouth daily.  . nebivolol (BYSTOLIC) 2.5 MG tablet Take 1 tablet (2.5 mg total) by mouth  daily. (Patient taking differently: Take 2.5 mg by mouth at bedtime. )  . pilocarpine (PILOCAR) 1 % ophthalmic solution Place 1 drop into both eyes 2 (two) times daily.   . predniSONE (DELTASONE) 10 MG tablet 4 tabs for 2 days, then 3 tabs for 2 days, 2 tabs for 2 days, then 1 tab for 2 days, then stop (Patient taking differently: Take 5 mg by mouth daily. For three days)  . traZODone (DESYREL) 100 MG tablet Take 100 mg by mouth at bedtime as needed for sleep.   [name] warfarin (COUMADIN) 2.5 MG tablet Take  1-1.5 tablets (2.5-3.75 mg total) by mouth daily. Take 7.5mg  (3 tablets) for the next 3 days (today, Saturday and Sunday). On Monday check INR and resume your normal dosing as noted below.  Take 3.75 mg (1.5 tablets) on Sun, Tues, Weds, Thurs, Sat & Take 2.5 mg (1 tablet) on Mon & Fri (Patient taking differently: Take 5 mg by mouth See admin instructions. Once daily at 1600.)   Allergies: Allergies as of 08/08/2019 - Review Complete 08/08/2019  Allergen Reaction Noted  . Alphagan [brimonidine] Other (See Comments) 03/16/2014  . Apraclonidine Other (See Comments) 07/03/2015  . Biaxin [clarithromycin] Other (See Comments) 03/16/2014  . Brovana [arformoterol] Other (See Comments) 09/24/2012  . Budesonide Other (See Comments) 09/24/2012  . Cefdinir Other (See Comments) 03/22/2014  . Cephalexin Itching 05/11/2011  . Cifenline Itching 03/28/2014  . Clonidine Other (See Comments)   . Clonidine derivatives Other (See Comments) 05/24/2013  . Dorzolamide Other (See Comments) 04/08/2015  . Iodinated diagnostic agents Other (See Comments) 02/22/2019  . Iohexol Other (See Comments) 10/11/2009  . Nsaids Other (See Comments) 03/16/2014  . Penicillins Itching, Swelling, and Other (See Comments)   . Sulfamethoxazole Other (See Comments) 03/16/2014  . Sulfonamide derivatives Itching   . Tetracycline Itching and Other (See Comments)   . Timolol Other (See Comments) 06/11/2014  . Vancomycin Other (See Comments)   . Zetia [ezetimibe] Other (See Comments) 03/16/2014  . Cardizem [diltiazem hcl] Rash 05/24/2013   Past Medical History:  Diagnosis Date  . Anxiety   . Arthritis   . CAD (coronary artery disease) 2007   a. 1997 s/p PCI of diagonal.  . Constipation   . COPD/Asthma   . Diverticulosis   . Esophageal reflux   . Family history of adverse reaction to anesthesia    daughter had trouble breathing after surgery, had to be reintubated  . Glaucoma   . HTN (hypertension), benign   .  Hyperlipidemia   . Malignant melanoma ([place])    a. 09/2012 s/p resection.  . Neuropathy    lower legs  . PAF (paroxysmal atrial fibrillation) (HCC)    a. intolerant to beta blockers due to asthma and intolerant to CCB due to rash;  b. CHA2DS2VASc = 6-->chronic coumadin;  c. 03/2014 Echo: EF 60-65%, Gr 1 DD, triv AI, mildly dil RV, PASP 75mmhg.  [name] TIA (transient ischemic attack)    a. 02/2011 - chronic coumadin in setting of PAF.  [name] Urinary, incontinence, stress female    Family History  Problem Relation Age of Onset  . Heart disease Father   . Hemachromatosis Father   . Heart attack Father   . Colon cancer Mother   . Peripheral vascular disease Mother   . Peripheral vascular disease Sister   . Cancer Sister   . Lymphoma Sister   . Heart attack Brother   . Heart disease Brother    Social History  Socioeconomic History  . Marital status: Divorced    Spouse name: Not on file  . Number of children: Not on file  . Years of education: Not on file  . Highest education level: Not on file  Occupational History  . 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   Tobacco Use  . Smoking status: Former Smoker    Packs/day: 0.20    Years: 25.00    Pack years: 5.00    Types: Cigarettes    Quit date: 07/06/1966    Years since quitting: 53.1  . Smokeless tobacco: Never Used  Substance and Sexual Activity  . Alcohol use: No    Alcohol/week: 0.0 standard drinks  . Drug use: No  . Sexual activity: Not on file  Other Topics Concern  . Not on file  Social History Narrative   Lives in [place] with her dog.  She is very independent.   Social Determinants of Health   Financial Resource Strain:   . Difficulty of Paying Living Expenses: Not on file  Food Insecurity:   . Worried About Charity fundraiser in the Last Year: Not on file  . Ran Out of Food in the Last Year: Not on file  Transportation Needs:   . Lack of Transportation (Medical): Not on file  . Lack of  Transportation (Non-Medical): Not on file  Physical Activity:   . Days of Exercise per Week: Not on file  . Minutes of Exercise per Session: Not on file  Stress:   . Feeling of Stress : Not on file  Social Connections:   . Frequency of Communication with Friends and Family: Not on file  . Frequency of Social Gatherings with Friends and Family: Not on file  . Attends Religious Services: Not on file  . Active Member of Clubs or Organizations: Not on file  . Attends Archivist Meetings: Not on file  . Marital Status: Not on file  Intimate Partner Violence:   . Fear of Current or Ex-Partner: Not on file  . Emotionally Abused: Not on file  . Physically Abused: Not on file  . Sexually Abused: Not on file   Review of Systems: A complete ROS was negative except as per HPI.   Physical Exam: Blood pressure 115/66, pulse (!) 124, temperature 100 F (37.8 C), temperature source Rectal, resp. rate (!) 30, SpO2 97 %.  General: Elderly female in no acute distress HENT: Normocephalic, atraumatic, moist mucus membranes Pulm: Good air movement with no wheezing or crackles  CV: Tachycardic, irregularly irregular rhythm, no murmurs, no rubs  Abdomen: Active bowel sounds, soft, non-distended, no tenderness to palpation  Extremities: Pulses palpable in all extremities, no LE edema  Skin: Warm and dry, erythema and tenderness to palpation of the left lower leg. No increase in swelling. Neuro: Alert and oriented x 3  EKG: personally reviewed my interpretation is a fib with RVR  Assessment & Plan by Problem: Active Problems:   Cellulitis  [name] is a 86 y.o female with a history of CAD, COPD, TIA, and persistent atrial fibrillation who presented to the emergency department with acute onset left lower extremity leg pain. Physical exam is consistent with non-purulent cellulitis. Due to a lack of great social support she was subsequently admitted for further  evaluation/management.  Non-purulent cellulitis - Hemodynamically stable but with significant leukocytosis. - Lower extremity Doppler is negative for VTE - Start ceftriaxone 1 g Q 24 hours. Reviewed  allergy list. She does have illicit allergy to penicillin but has never had significant swelling or anaphylaxis. - Likely will be able to transition to PO medications tomorrow.  Persistent Atrial Fibrillation  Supratherapeutic INR - Currently in atrial fibrillation. Follows with Dr. Radford Pax and I have reviewed her last office note. - Has opted for less aggressive control of her persistent atrial fibrillation. - Continue metoprolol tartrate 25 mg BID - On warfarin for anticoagulation. INR greater than 10. No significant signs of bleeding. Will give 2.5 mg vitamin K orally and hold warfarin  COPD - Continue Breo 1 puff BID  HTN - Continue Amlodipine 10 mg QD  Diet: Regular  VTE ppx: Warfarin CODE STATUS: Full Code  Dispo: Admit patient to Observation with expected length of stay less than 2 midnights.  SignedIna Homes, MD 08/08/2019, 12:13 PM  Pager: [phone]

## 2019-08-08 NOTE — Progress Notes (Signed)
Left lower extremity venous duplex has been completed. Preliminary results can be found in CV Proc through chart review.  Results were given to Dr. Sedonia Small.  08/08/19 9:59 AM Carlos Levering RVT

## 2019-08-08 NOTE — Progress Notes (Signed)
Spoke with the patient's granddaughter. The patient has not been taking her metoprolol because she feels it makes her sick. Instead she has been taking 1.25mg  of bystolic. Patient does not like to take medications.   After further discuss with the granddaughter we will give the patient Metoprolol 25 mg once then decrease to 12.5 mg BID to see if we can control her HR. Overall the patient does not want aggressive treatments/interventions.

## 2019-08-08 NOTE — ED Triage Notes (Signed)
Pt called EMS this morning for left leg pain- from the knee up. No fall/injury. EMS noticed pt's HR elevated, obtained EKG- Afib rate 120-150. Pt has hx of Afib. EMS gave 530mL bolus fluid, due to pt saying she has had some diarrhea. No change in rate. Pt received covid vaccine on Friday. Pt is not SOB/ no CP. Denies nausea. BP 136/78, resp 18, CBG 124, 100% on room air. Pt lives at home alone- family checks in on her.

## 2019-08-09 DIAGNOSIS — Z91041 Radiographic dye allergy status: Secondary | ICD-10-CM

## 2019-08-09 DIAGNOSIS — Z881 Allergy status to other antibiotic agents status: Secondary | ICD-10-CM

## 2019-08-09 DIAGNOSIS — Z888 Allergy status to other drugs, medicaments and biological substances status: Secondary | ICD-10-CM

## 2019-08-09 DIAGNOSIS — Z7901 Long term (current) use of anticoagulants: Secondary | ICD-10-CM

## 2019-08-09 DIAGNOSIS — L8990 Pressure ulcer of unspecified site, unspecified stage: Secondary | ICD-10-CM | POA: Insufficient documentation

## 2019-08-09 DIAGNOSIS — I2510 Atherosclerotic heart disease of native coronary artery without angina pectoris: Secondary | ICD-10-CM | POA: Diagnosis not present

## 2019-08-09 DIAGNOSIS — Z79899 Other long term (current) drug therapy: Secondary | ICD-10-CM

## 2019-08-09 DIAGNOSIS — Z8673 Personal history of transient ischemic attack (TIA), and cerebral infarction without residual deficits: Secondary | ICD-10-CM

## 2019-08-09 DIAGNOSIS — I4819 Other persistent atrial fibrillation: Secondary | ICD-10-CM | POA: Diagnosis not present

## 2019-08-09 DIAGNOSIS — L03116 Cellulitis of left lower limb: Secondary | ICD-10-CM | POA: Diagnosis not present

## 2019-08-09 DIAGNOSIS — Z882 Allergy status to sulfonamides status: Secondary | ICD-10-CM

## 2019-08-09 DIAGNOSIS — Z66 Do not resuscitate: Secondary | ICD-10-CM

## 2019-08-09 DIAGNOSIS — Z880 Allergy status to penicillin: Secondary | ICD-10-CM

## 2019-08-09 DIAGNOSIS — J449 Chronic obstructive pulmonary disease, unspecified: Secondary | ICD-10-CM

## 2019-08-09 DIAGNOSIS — Z886 Allergy status to analgesic agent status: Secondary | ICD-10-CM

## 2019-08-09 DIAGNOSIS — Z7951 Long term (current) use of inhaled steroids: Secondary | ICD-10-CM

## 2019-08-09 DIAGNOSIS — R791 Abnormal coagulation profile: Secondary | ICD-10-CM | POA: Diagnosis not present

## 2019-08-09 LAB — CBC
HCT: 28 % — ABNORMAL LOW (ref 36.0–46.0)
Hemoglobin: 9.4 g/dL — ABNORMAL LOW (ref 12.0–15.0)
MCH: 31 pg (ref 26.0–34.0)
MCHC: 33.6 g/dL (ref 30.0–36.0)
MCV: 92.4 fL (ref 80.0–100.0)
Platelets: UNDETERMINED 10*3/uL (ref 150–400)
RBC: 3.03 MIL/uL — ABNORMAL LOW (ref 3.87–5.11)
RDW: 15 % (ref 11.5–15.5)
WBC: 11.7 10*3/uL — ABNORMAL HIGH (ref 4.0–10.5)
nRBC: 0 % (ref 0.0–0.2)

## 2019-08-09 LAB — PROTIME-INR
INR: 5 (ref 0.8–1.2)
Prothrombin Time: 46.7 seconds — ABNORMAL HIGH (ref 11.4–15.2)

## 2019-08-09 MED ORDER — METOPROLOL TARTRATE 25 MG PO TABS: 12.5000 mg | ORAL_TABLET | Freq: Two times a day (BID) | ORAL | 0 refills | Status: DC

## 2019-08-09 MED ORDER — CEPHALEXIN 500 MG PO CAPS: 500.0000 mg | ORAL_CAPSULE | Freq: Four times a day (QID) | ORAL | 0 refills | Status: AC

## 2019-08-09 NOTE — Evaluation (Signed)
Physical Therapy Evaluation Patient Details Name: [name] MRN: [id number] DOB: 1924/01/30 Today's Date: 08/09/2019   History of Present Illness  Pt is a 86 y/o female admitted secondary to L LE pain and found to have cellulitis. PMH including but not limited to CAD, HTN, COPD, and PAF.    Clinical Impression  Pt presented supine in bed with HOB elevated, awake and willing to participate in therapy session. Prior to admission, pt reported that she ambulated with use of a rollator and was independent with ADLs. Pt lives alone but stated that she has family that could be with her 24/7 if needed. At the time of evaluation, pt limited secondary to L thigh pain and weakness. Pt overall needing min A for bed mobility and transfers. She was able to ambulate a very short distance within her room with RW and min guard. Pt would continue to benefit from skilled physical therapy services at this time while admitted and after d/c to address the below listed limitations in order to improve overall safety and independence with functional mobility.     Follow Up Recommendations Home health PT;Supervision/Assistance - 24 hour    Equipment Recommendations  None recommended by PT    Recommendations for Other Services       Precautions / Restrictions Precautions Precautions: Fall Restrictions Weight Bearing Restrictions: No      Mobility  Bed Mobility Overal bed mobility: Needs Assistance Bed Mobility: Supine to Sit     Supine to sit: Min assist     General bed mobility comments: increased time and effort, HOB elevated, pt using bilateral UEs to assist with L LE movement off of bed, PT assisting further with L LE movement  Transfers Overall transfer level: Needs assistance Equipment used: Rolling walker (2 wheeled) Transfers: Sit to/from [facility] Sit to Stand: Min assist Stand pivot transfers: Min assist       General transfer comment: bed slightly  elevated, min A to power into standing and for stability with transitional movement  Ambulation/Gait Ambulation/Gait assistance: Min guard Gait Distance (Feet): 10 Feet Assistive device: Rolling walker (2 wheeled) Gait Pattern/deviations: Step-to pattern;Decreased step length - left;Decreased step length - right;Decreased stride length;Antalgic Gait velocity: decreased   General Gait Details: pt with flexed posture and maintaining flexion in L knee throughout; pt limited secondary to pain  Stairs            Wheelchair Mobility    Modified Rankin (Stroke Patients Only)       Balance Overall balance assessment: Needs assistance Sitting-balance support: Feet supported Sitting balance-Leahy Scale: Fair     Standing balance support: Bilateral upper extremity supported;Single extremity supported Standing balance-Leahy Scale: Poor                               Pertinent Vitals/Pain Pain Assessment: Faces Faces Pain Scale: Hurts little more Pain Location: L thigh Pain Descriptors / Indicators: Grimacing;Guarding Pain Intervention(s): Monitored during session;Repositioned    Home Living Family/patient expects to be discharged to:: Private residence Living Arrangements: Alone Available Help at Discharge: Family;Friend(s);Available 24 hours/day Type of Home: House Home Access: Stairs to enter   [employer] of Steps: 1 Home Layout: One level Home Equipment: Walker - 2 wheels;Walker - 4 wheels;Shower seat      Prior Function Level of Independence: Independent with assistive device(s)         Comments: ambulates with use of a rollator, is  independent with ADLs, family performs IADLs     Hand Dominance        Extremity/Trunk Assessment   Upper Extremity Assessment Upper Extremity Assessment: Generalized weakness    Lower Extremity Assessment Lower Extremity Assessment: Generalized weakness    Cervical / Trunk Assessment Cervical /  Trunk Assessment: Kyphotic  Communication   Communication: No difficulties  Cognition Arousal/Alertness: Awake/alert Behavior During Therapy: WFL for tasks assessed/performed Overall Cognitive Status: Within Functional Limits for tasks assessed                                        General Comments      Exercises     Assessment/Plan    PT Assessment Patient needs continued PT services  PT Problem List Decreased strength;Decreased range of motion;Decreased activity tolerance;Decreased balance;Decreased mobility;Decreased coordination;Decreased knowledge of use of DME;Decreased safety awareness;Decreased knowledge of precautions;Pain       PT Treatment Interventions DME instruction;Stair training;Gait training;Functional mobility training;Therapeutic activities;Balance training;Therapeutic exercise;Neuromuscular re-education;Patient/family education    PT Goals (Current goals can be found in the Care Plan section)  Acute Rehab PT Goals Patient Stated Goal: decrease pain; return home PT Goal Formulation: With patient Time For Goal Achievement: 08/23/19 Potential to Achieve Goals: Good    Frequency Min 3X/week   Barriers to discharge        Co-evaluation               AM-PAC PT "6 Clicks" Mobility  Outcome Measure Help needed turning from your back to your side while in a flat bed without using bedrails?: A Little Help needed moving from lying on your back to sitting on the side of a flat bed without using bedrails?: A Little Help needed moving to and from a bed to a chair (including a wheelchair)?: A Little Help needed standing up from a chair using your arms (e.g., wheelchair or bedside chair)?: A Little Help needed to walk in hospital room?: A Little Help needed climbing 3-5 steps with a railing? : A Lot 6 Click Score: 17    End of Session Equipment Utilized During Treatment: Gait belt Activity Tolerance: Patient limited by pain Patient left:  in chair;with call bell/phone within reach;with chair alarm set Nurse Communication: Mobility status PT Visit Diagnosis: Other abnormalities of gait and mobility (R26.89);Pain Pain - Right/Left: Left Pain - part of body: Leg    Time: [id number] PT Time Calculation (min) (ACUTE ONLY): 20 min   Charges:   PT Evaluation $PT Eval Moderate Complexity: 1 Mod          Eduard Clos, PT, DPT  Acute Rehabilitation Services Pager [phone] Office [place] 08/09/2019, 12:17 PM

## 2019-08-09 NOTE — Evaluation (Signed)
Occupational Therapy Evaluation Patient Details Name: [name] MRN: [id number] DOB: [birth year] Today's Date: 08/09/2019    History of Present Illness Pt is a 86 y/o female admitted secondary to L LE pain and found to have cellulitis. PMH including but not limited to CAD, HTN, COPD, and PAF.   Clinical Impression   Pt with decline in function and safety with ADLs and ADL mobility with impaired strength, balance and endurance. Pt lives at home with family nearby, she reports that she was independent with ADLs/selfcare and uses a Marketing executive for mobility; family assists with home mgt, IADLs. Pt currently requires mod A with LB ADLs, min guard A with UB ADLs, mod A with toileting, Poor standing balance and min A with mobility using RW. Pt states that her family can provide 24/7 assist and wants to go home. Pt would benefit from acute OT services to address impairments to maximize level of function and safety    Follow Up Recommendations  Home health OT;Supervision/Assistance - 24 hour    Equipment Recommendations  Other (comment)(reacher)    Recommendations for Other Services       Precautions / Restrictions Precautions Precautions: Fall Restrictions Weight Bearing Restrictions: No      Mobility Bed Mobility Overal bed mobility: Needs Assistance Bed Mobility: Supine to Sit     Supine to sit: Min assist     General bed mobility comments: pt in recliner upon arrival  Transfers Overall transfer level: Needs assistance Equipment used: Rolling walker (2 wheeled) Transfers: Sit to/from [facility] Sit to Stand: Min assist Stand pivot transfers: Min assist       General transfer comment: from recliner and BSC    Balance Overall balance assessment: Needs assistance Sitting-balance support: Feet supported Sitting balance-Leahy Scale: Fair     Standing balance support: Bilateral upper extremity supported;Single extremity supported Standing  balance-Leahy Scale: Poor                             ADL either performed or assessed with clinical judgement   ADL Overall ADL's : Needs assistance/impaired Eating/Feeding: Set up;Independent;Sitting   Grooming: Wash/dry hands;Wash/dry face;Minimal assistance;Standing   Upper Body Bathing: Min guard;Sitting   Lower Body Bathing: Moderate assistance   Upper Body Dressing : Min guard;Sitting   Lower Body Dressing: Moderate assistance   Toilet Transfer: Minimal assistance;Ambulation;RW;Stand-pivot;Cueing for safety   Toileting- Clothing Manipulation and Hygiene: Moderate assistance       Functional mobility during ADLs: Minimal assistance;Cueing for safety;Rolling walker       Vision Patient Visual Report: No change from baseline       Perception     Praxis      Pertinent Vitals/Pain Pain Assessment: Faces Faces Pain Scale: Hurts little more Pain Location: L thigh Pain Descriptors / Indicators: Grimacing;Guarding Pain Intervention(s): Limited activity within patient's tolerance;Monitored during session;Repositioned     Hand Dominance Right   Extremity/Trunk Assessment Upper Extremity Assessment Upper Extremity Assessment: Generalized weakness   Lower Extremity Assessment Lower Extremity Assessment: Defer to PT evaluation   Cervical / Trunk Assessment Cervical / Trunk Assessment: Kyphotic   Communication Communication Communication: No difficulties   Cognition Arousal/Alertness: Awake/alert Behavior During Therapy: WFL for tasks assessed/performed Overall Cognitive Status: Within Functional Limits for tasks assessed  General Comments       Exercises     Shoulder Instructions      Home Living Family/patient expects to be discharged to:: Private residence Living Arrangements: Alone Available Help at Discharge: Family;Friend(s);Available 24 hours/day Type of Home: House Home Access:  Stairs to enter [employer] of Steps: 1   Home Layout: One level     Bathroom Shower/Tub: Occupational psychologist: Handicapped height     Home Equipment: Environmental consultant - 2 wheels;Walker - 4 wheels;Shower seat          Prior Functioning/Environment Level of Independence: Independent with assistive device(s)        Comments: ambulates with use of a rollator, is independent with ADLs, family performs IADLs        OT Problem List: Decreased strength;Impaired balance (sitting and/or standing);Decreased knowledge of precautions;Decreased activity tolerance;Decreased knowledge of use of DME or AE      OT Treatment/Interventions: Self-care/ADL training;DME and/or AE instruction;Therapeutic activities;Balance training;Therapeutic exercise;Patient/family education    OT Goals(Current goals can be found in the care plan section) Acute Rehab OT Goals Patient Stated Goal: decrease pain; return home OT Goal Formulation: With patient Time For Goal Achievement: 08/23/19 Potential to Achieve Goals: Good ADL Goals Pt Will Perform Grooming: with min guard assist;standing;with caregiver independent in assisting Pt Will Perform Upper Body Bathing: with supervision;with set-up;sitting;with caregiver independent in assisting Pt Will Perform Lower Body Bathing: with min guard assist;sitting/lateral leans;sit to/from stand;with caregiver independent in assisting Pt Will Perform Upper Body Dressing: with supervision;with set-up;sitting;with caregiver independent in assisting Pt Will Perform Lower Body Dressing: with min assist;sitting/lateral leans;sit to/from stand;with caregiver independent in assisting Pt Will Transfer to Toilet: with min guard assist;with supervision;ambulating Pt Will Perform Toileting - Clothing Manipulation and hygiene: with min assist;sit to/from stand;with caregiver independent in assisting  OT Frequency: Min 2X/week   Barriers to D/C:             Co-evaluation              AM-PAC OT "6 Clicks" Daily Activity     Outcome Measure Help from another person eating meals?: None Help from another person taking care of personal grooming?: A Little Help from another person toileting, which includes using toliet, bedpan, or urinal?: A Lot Help from another person bathing (including washing, rinsing, drying)?: A Lot Help from another person to put on and taking off regular upper body clothing?: A Little Help from another person to put on and taking off regular lower body clothing?: A Lot 6 Click Score: 16   End of Session Equipment Utilized During Treatment: Gait belt;Rolling walker;Other (comment)(BSC)  Activity Tolerance: Patient tolerated treatment well Patient left: in chair;with call bell/phone within reach;with chair alarm set  OT Visit Diagnosis: Unsteadiness on feet (R26.81);Other abnormalities of gait and mobility (R26.89);Muscle weakness (generalized) (M62.81)                Time: [id number] OT Time Calculation (min): 27 min Charges:  OT General Charges $OT Visit: 1 Visit OT Evaluation $OT Eval Moderate Complexity: 1 Mod OT Treatments $Self Care/Home Management : 8-22 mins    Britt Bottom 08/09/2019, 1:34 PM

## 2019-08-09 NOTE — TOC Transition Note (Signed)
Transition of Care [facility]) - CM/SW Discharge Note   Patient Details  Name: [name] MRN: [id number] Date of Birth: 07/25/1923  [facility]) CM/SW Contact:  Pollie Friar, RN Phone Number: 08/09/2019, 2:01 PM   Clinical Narrative:    Pt discharging home with [facility] services through [place]. Tommi Rumps with [facility] accepted the referral.  Pt's Granddaughter to provide supervision at home. She will also provide transport home.   Final next level of care: Home w Home Health Services Barriers to Discharge: No Barriers Identified   Patient Goals and CMS Choice   CMS Medicare.gov Compare Post Acute Care list provided to:: Patient Choice offered to / list presented to : Patient(Granddaughter)  Discharge Placement                       Discharge Plan and Services                          HH Arranged: PT, OT [facility] Agency: [place] Date [place]: 08/09/19   Representative spoke with at [place]: [place] ([place]) Interventions     Readmission Risk Interventions No flowsheet data found.

## 2019-08-09 NOTE — Discharge Instructions (Signed)

## 2019-08-09 NOTE — Plan of Care (Signed)
  Problem: Education: Goal: Knowledge of General Education information will improve Description: Including pain rating scale, medication(s)/side effects and non-pharmacologic comfort measures Outcome: Completed/Met   Problem: Health Behavior/Discharge Planning: Goal: Ability to manage health-related needs will improve Outcome: Completed/Met   Problem: Clinical Measurements: Goal: Ability to maintain clinical measurements within normal limits will improve Outcome: Completed/Met Goal: Will remain free from infection Outcome: Completed/Met Goal: Diagnostic test results will improve Outcome: Completed/Met Goal: Respiratory complications will improve Outcome: Completed/Met Goal: Cardiovascular complication will be avoided Outcome: Completed/Met   Problem: Activity: Goal: Risk for activity intolerance will decrease Outcome: Completed/Met   Problem: Nutrition: Goal: Adequate nutrition will be maintained Outcome: Completed/Met   Problem: Coping: Goal: Level of anxiety will decrease Outcome: Completed/Met   Problem: Elimination: Goal: Will not experience complications related to bowel motility Outcome: Completed/Met Goal: Will not experience complications related to urinary retention Outcome: Completed/Met   Problem: Pain Managment: Goal: General experience of comfort will improve Outcome: Completed/Met   Problem: Safety: Goal: Ability to remain free from injury will improve Outcome: Completed/Met   Problem: Skin Integrity: Goal: Risk for impaired skin integrity will decrease Outcome: Completed/Met  Discharge instructions reviewed with patient.  These included, but were not limited to, the following:  Discharge medications, when to call the MD, follow-up medications, follow-up appointments, wound care, Home Health services contacts, 9-1-1 services, etc.

## 2019-08-09 NOTE — Consult Note (Signed)
WOC Nurse Consult Note: Patient receiving care in [facility] 5N13. Reason for Consult: chronic left foot wound Wound type: non-healing full thickness for which the patient has seen Dr. Sharol Given in the distant past. Pressure Injury POA: Yes/No/NA Measurement: 1 cm x 0.5 cm x 0.5 cm Wound bed: pink Drainage (amount, consistency, odor) white drainage Periwound: heavily callused Dressing procedure/placement/frequency:  Place a small piece of Aquacel Kellie Simmering [id number]) into the left foot, plantar surface, wound. Cover with a small foam dressing. Change daily. Monitor the wound area(s) for worsening of condition such as: Signs/symptoms of infection,  Increase in size,  Development of or worsening of odor, Development of pain, or increased pain at the affected locations.  Notify the medical team if any of these develop.  Thank you for the consult.  Discussed plan of care with the patient.  [place] nurse will not follow at this time.  Please re-consult the [place] team if needed.  Val Riles, RN, MSN, CWOCN, CNS-BC, pager [phone]

## 2019-08-09 NOTE — Progress Notes (Signed)
Subjective: HD#1. Overnight, no acute events reported. Patient reports that she is feeling a little better today, she reports improvement in her pain. She states that she does have some bruising, we discussed that she needs to hold her coumadin for a few days and follow up with her PCP. Patient expresses understanding and is agreeable with the plan.   Objective: CBC Latest Ref Rng & Units 08/09/2019 08/08/2019 02/20/2019  WBC 4.0 - 10.5 K/uL 11.7(H) 20.1(H) 6.1  Hemoglobin 12.0 - 15.0 g/dL 9.4(L) 11.3(L) 10.4(L)  Hematocrit 36.0 - 46.0 % 28.0(L) 34.0(L) 32.5(L)  Platelets 150 - 400 K/uL PLATELET CLUMPS NOTED ON SMEAR, UNABLE TO ESTIMATE PLATELET CLUMPS NOTED ON SMEAR, UNABLE TO ESTIMATE 135(L)   BMP Latest Ref Rng & Units 08/08/2019 02/20/2019 01/28/2019  Glucose 70 - 99 mg/dL 110(H) 115(H) 113(H)  BUN 8 - 23 mg/dL 25(H) 16 18  Creatinine 0.44 - 1.00 mg/dL 0.94 0.74 0.84  Sodium 135 - 145 mmol/L 135 138 138  Potassium 3.5 - 5.1 mmol/L 4.5 4.5 4.6  Chloride 98 - 111 mmol/L 101 102 105  CO2 22 - 32 mmol/L 23 28 26   Calcium 8.9 - 10.3 mg/dL 8.5(L) 8.7(L) 8.9   INR/Prothrombin Time >10.0/82.7 >> 5.0/46.7  D-dimer 0.43  Vital signs in last 24 hours: Vitals:   08/08/19 1330 08/08/19 1446 08/08/19 2039 08/09/19 0325  BP: 110/67 119/71 (!) 94/56 108/68  Pulse: (!) 113 97 89 94  Resp: (!) 24 17 20 14   Temp:  99 F (37.2 C) 97.8 F (36.6 C) (!) 97.2 F (36.2 C)  TempSrc:  Oral    SpO2: 98% 97% 97% 97%   Physical Exam Constitutional:      General: She is not in acute distress.    Appearance: Normal appearance. She is not ill-appearing.  HENT:     Head: Normocephalic and atraumatic.  Cardiovascular:     Rate and Rhythm: Normal rate. Rhythm irregular.     Pulses: Normal pulses.     Heart sounds: Normal heart sounds. No murmur. No friction rub. No gallop.   Pulmonary:     Effort: Pulmonary effort is normal. No respiratory distress.     Breath sounds: Normal breath sounds. No wheezing  or rales.  Abdominal:     General: Abdomen is flat. Bowel sounds are normal.     Palpations: Abdomen is soft.  Musculoskeletal:        General: No swelling or tenderness. Normal range of motion.  Skin:    General: Skin is warm and dry.     Capillary Refill: Capillary refill takes less than 2 seconds.     Findings: Bruising and erythema present.     Comments: BUE: extensive bruising noted in bilateral arms  LLE: erythema noted to mid tibia  without edema or significant tenderness to touch  Neurological:     General: No focal deficit present.     Mental Status: She is alert and oriented to person, place, and time.    Assessment/Plan: [name] is a 86yo community dwelling female with history of CAD, COPD, TIA and persistent A.fib presenting with acute onset left lower extremity pain secondary to non-purulent cellulitis and A.fib with RVR.   Non-purulent cellulitis:  Patient presented with acute on chronic left lower extremity pain with erythema and tenderness of palpation. Lower extremity doppler negative for DVT. Noted to have significant leukocytosis. She was started on ceftriaxone with significant improvement in pain and leukocytosis.  Plan:  - Transition to PO  keflex for 4 days to complete 5 day course of antibiotics - PT/OT eval  Persistent Atrial Fibrillation Supratherapeutic INR  Patient presented with A.fib with RVR. Per discussion with patient's granddaughter, patient had not been taking her metoprolol. Patient restarted on metoprolol and currently rate controlled. Patient has also been on warfarin chronically for CHADSVASC2 score of 6. Noted to have supratherapeutic INR for which warfarin held and given vitamin K. Her INR is improved this morning but still supratherapeutic. No active signs of bleeding.  Plan:  - Continue metoprolol 12.5mg  bid - Continue to hold warfarin, goal INR 2-3 - Patient to follow up with PCP on discharge for INR monitoring  Hx of CAD,  HTN:  - Continue amlodipine 10mg  qd   Hx of COPD:  - Continue montelukast 10mg  and Breo 1 puff qd  FEN/GI:  Diet: Regular  VTE Prophylaxis: SCDs, holding warfarin in setting of supratherapeutic INR  Code Status: DNR   Prior to Admission Living Arrangement: Home Anticipated Discharge Location: Home Barriers to Discharge: None Dispo: Anticipated discharge today.   Harvie Heck, MD Internal Medicine, PGY-1 Pager # [phone] 08/09/19   9:10 AM

## 2019-08-09 NOTE — Care Management Obs Status (Signed)
[place] NOTIFICATION   Patient Details  Name: [name] MRN: [id number] Date of Birth: 12/13/1923   Medicare Observation Status Notification Given:  Yes    Pollie Friar, RN 08/09/2019, 1:48 PM

## 2019-08-09 NOTE — Progress Notes (Signed)
Date: 08/09/2019  Patient name: [name]  Medical record number: [id number]  Date of birth: [birth year]   I have seen and evaluated [name] and discussed their care with the Residency Team.  In brief, patient is a 86 year old female with a past medical history of CAD, COPD, TIA and persistent A. fib who presented to the ED with worsening left lower extremity pain x1 day  Patient states that on the night prior to admission she noted acute worsening of her chronic left lower extremity pain.  The pain kept her up most of the night. She discussed it with her granddaughter who brought her to the emergency department for further evaluation.  Patient denies any fevers or chills, no headache, no blurry vision, no focal weakness, chest pain, no shortness of breath, no nausea or vomiting, no diarrhea, no abdominal pain.  Today patient states that her left lower extremity pain is much improved and she denies any other complaints currently.  PMHx, Fam Hx, and/or Soc Hx : As per resident admit note  Vitals:   08/09/19 0803 08/09/19 0810  BP: 104/69   Pulse: 91 (!) 101  Resp: 17 16  Temp: 97.7 F (36.5 C)   SpO2: 99% 98%   General: Awake, alert, oriented x3, NAD CVS: Irregularly irregular, normal rate Lungs: CTA bilaterally Abdomen: Soft, nontender, nondistended, normoactive bowel sounds Extremities: Erythema noted over left lower extremity extending from today ankle to the mid tibia but no tenderness to palpation and no increased local warmth noted HEENT: Normocephalic, atraumatic Psych: Normal mood and affect Skin: Extensive bruising noted in bilateral upper extremities as well as over right foot  Assessment and Plan: I have seen and evaluated the patient as outlined above. I agree with the formulated Assessment and Plan as detailed in the residents' note, with the following changes:   1.  Left lower extremity cellulitis: -Patient presented to the ED with acutely  worsening pain in her left lower extremity and was found to have left lower extremity cellulitis on exam. -There was initial concern for possible DVT but no extremity Doppler showed no evidence of DVT and patient's INR was supratherapeutic at greater than 10 on admission which would make this less likely. -Patient's pain and exam have improved markedly with ceftriaxone.  Will transition patient to oral Keflex today to complete a 5-day course. -We will follow PT/OT evaluation -Patient leukocytosis is improved from 20 to 11 today and she has remained afebrile. -No further work-up at this time  2.  Persistent A. fib with RVR: -Patient noted to have A. fib with RVR on initial presentation to the ED.  EKG showed A. fib with RVR with rates in the 130s. -Patient was restarted on metoprolol 12.5 mg twice daily and her heart rates are now well controlled -Patient is on anticoagulation chronically with warfarin and her INR was noted to be supratherapeutic (greater than 10).  Patient is status post oral vitamin K with improvement of her INR to 5 today. -Patient with no signs of active bleeding but has ecchymosis over bilateral upper extremities as well as right foot.  Patient's hemoglobin has decreased to 9.4 today from 11.3 on admission.  Patient's baseline hemoglobin appears to be 10-11.  We will continue to monitor closely. -We will continue to hold Coumadin for now -Patient to follow-up with PCP on discharge for INR monitoring and repeat CBC -No further work-up at this time.  Patient stable for DC home today.  Aldine Contes, MD 2/3/202111:11 AM

## 2019-08-09 NOTE — Progress Notes (Signed)
ANTICOAGULATION CONSULT NOTE - Initial Consult  Pharmacy Consult for warfarin Indication: atrial fibrillation  Allergies  Allergen Reactions  . Alphagan [Brimonidine] Other (See Comments)    Burning sensation  . Apraclonidine Other (See Comments)    Pain, brow pain, tender, not able to tolerate  . Biaxin [Clarithromycin] Other (See Comments)    Unknown, per pt   . Brovana [Arformoterol] Other (See Comments)    Makes my heart race  . Budesonide Other (See Comments)    Makes my heart race  . Cefdinir Other (See Comments)    "FEEL HORRIBLE"  . Cephalexin Itching  . Cifenline Itching  . Clonidine Other (See Comments)    unknown  . Clonidine Derivatives Other (See Comments)    REACTION: unknown  . Dorzolamide Other (See Comments)    Redness  . Iodinated Diagnostic Agents Other (See Comments)    unknown  . Iohexol Other (See Comments)     Desc: NOTES FROM PRIOR CT STATES PRE MEDICATION PRIOR TO OMNIPAQUE ENHANCED CT   . Nsaids Other (See Comments)    Unknown  . Penicillins Itching, Swelling and Other (See Comments)    Swelling and edema of ears and rash Swelling to ears  Has patient had a PCN reaction causing immediate rash, facial/tongue/throat swelling, SOB or lightheadedness with hypotension: {no Has patient had a PCN reaction causing severe rash involving mucus membranes or skin necrosis: {no Has patient had a PCN reaction that required hospitalization no Has patient had a PCN reaction occurring within the last 10 years: no If all of the above answers are "NO", then may proceed with Cephalosporin use.  . Sulfamethoxazole Other (See Comments)    Unknown  . Sulfonamide Derivatives Itching  . Tetracycline Itching and Other (See Comments)    unknown  . Timolol Other (See Comments)    Eye red and irritated   . Vancomycin Other (See Comments)    Turn red  . Zetia [Ezetimibe] Other (See Comments)    Unknown  . Cardizem [Diltiazem Hcl] Rash    Patient Measurements:     Vital Signs: Temp: 97.7 F (36.5 C) (02/03 0803) Temp Source: Oral (02/03 0803) BP: 104/69 (02/03 0803) Pulse Rate: 91 (02/03 0803)  Labs: Recent Labs    08/08/19 0920 08/09/19 0442  HGB 11.3* 9.4*  HCT 34.0* 28.0*  PLT PLATELET CLUMPS NOTED ON SMEAR, UNABLE TO ESTIMATE PLATELET CLUMPS NOTED ON SMEAR, UNABLE TO ESTIMATE  LABPROT 82.7* 46.7*  INR >10.0* 5.0*  CREATININE 0.94  --     CrCl cannot be calculated (Unknown ideal weight.).   Medical History: Past Medical History:  Diagnosis Date  . Anxiety   . Arthritis   . CAD (coronary artery disease) 2007   a. 1997 s/p PCI of diagonal.  . Constipation   . COPD/Asthma   . Diverticulosis   . Esophageal reflux   . Family history of adverse reaction to anesthesia    daughter had trouble breathing after surgery, had to be reintubated  . Glaucoma   . HTN (hypertension), benign   . Hyperlipidemia   . Malignant melanoma ([place])    a. 09/2012 s/p resection.  . Neuropathy    lower legs  . PAF (paroxysmal atrial fibrillation) (HCC)    a. intolerant to beta blockers due to asthma and intolerant to CCB due to rash;  b. CHA2DS2VASc = 6-->chronic coumadin;  c. 03/2014 Echo: EF 60-65%, Gr 1 DD, triv AI, mildly dil RV, PASP 48mmhg.  [name] TIA (transient ischemic attack)  a. 02/2011 - chronic coumadin in setting of PAF.  [name] Urinary, incontinence, stress female     Medications:  Scheduled:  . amLODipine  10 mg Oral Daily  . citalopram  10 mg Oral Daily  . fluticasone furoate-vilanterol  1 puff Inhalation Daily  . loratadine  10 mg Oral Daily  . metoprolol tartrate  12.5 mg Oral BID  . montelukast  10 mg Oral QHS   Infusions:    Assessment: 86 yo F on warfarin 5 mg daily for afib. INR on admission is above goal >10. INR today 5. Hgb 11.3>>9.4 and pltc unknown (clumps noted on smear). S/P vitamin K 2.5 mg PO x 1 per MD.  Goal of Therapy:  INR 2-3 Monitor platelets by anticoagulation protocol: Yes   Plan:  Hold warfarin  tonight Daily INR Monitor for bleeding  Yuvan Medinger A. Levada Dy, PharmD, BCPS, FNKF Clinical Pharmacist [place] Please utilize Amion for appropriate phone number to reach the unit pharmacist ([place])

## 2019-08-10 NOTE — Discharge Summary (Signed)
Name: [name] MRN: [id number] DOB: 1924-01-15 86 y.o. PCP: Julian Hy, PA-C  Date of Admission: 08/08/2019  8:53 AM Date of Discharge: 08/09/2019 Attending Physician: Dr. Aldine Contes Discharge Diagnosis: 1.Left lower extremity cellulitis 2. Persistent A.fib with RVR 3. Supratherapeutic INR  Discharge Medications: Allergies as of 08/09/2019      Reactions   Alphagan [brimonidine] Other (See Comments)   Burning sensation   Apraclonidine Other (See Comments)   Pain, brow pain, tender, not able to tolerate   Biaxin [clarithromycin] Other (See Comments)   Unknown, per pt   Brovana [arformoterol] Other (See Comments)   Makes my heart race   Budesonide Other (See Comments)   Makes my heart race   Cefdinir Other (See Comments)   "FEEL HORRIBLE"   Cephalexin Itching   Cifenline Itching   Clonidine Other (See Comments)   unknown   Clonidine Derivatives Other (See Comments)   REACTION: unknown   Dorzolamide Other (See Comments)   Redness   Iodinated Diagnostic Agents Other (See Comments)   unknown   Iohexol Other (See Comments)    Desc: NOTES FROM PRIOR CT STATES PRE MEDICATION PRIOR TO OMNIPAQUE ENHANCED CT   Nsaids Other (See Comments)   Unknown   Penicillins Itching, Swelling, Other (See Comments)   Swelling and edema of ears and rash Swelling to ears Has patient had a PCN reaction causing immediate rash, facial/tongue/throat swelling, SOB or lightheadedness with hypotension: {no Has patient had a PCN reaction causing severe rash involving mucus membranes or skin necrosis: {no Has patient had a PCN reaction that required hospitalization no Has patient had a PCN reaction occurring within the last 10 years: no If all of the above answers are "NO", then may proceed with Cephalosporin use.   Sulfamethoxazole Other (See Comments)   Unknown   Sulfonamide Derivatives Itching   Tetracycline Itching, Other (See Comments)   unknown   Timolol Other (See  Comments)   Eye red and irritated   Vancomycin Other (See Comments)   Turn red   Zetia [ezetimibe] Other (See Comments)   Unknown   Cardizem [diltiazem Hcl] Rash      Medication List    STOP taking these medications   nebivolol 2.5 MG tablet Commonly known as: Bystolic     TAKE these medications   acetaminophen 325 MG tablet Commonly known as: TYLENOL Take 325 mg by mouth every 6 (six) hours as needed for moderate pain.   albuterol 108 (90 Base) MCG/ACT inhaler Commonly known as: ProAir HFA Inhale 1-2 puffs into the lungs every 4 (four) hours as needed for wheezing or shortness of breath.   albuterol (2.5 MG/3ML) 0.083% nebulizer solution Commonly known as: PROVENTIL Take 3 mLs (2.5 mg total) by nebulization every 6 (six) hours as needed for wheezing or shortness of breath.   ALPRAZolam 0.5 MG tablet Commonly known as: XANAX Take 0.5 mg by mouth 2 (two) times daily as needed for anxiety.   amLODipine 10 MG tablet Commonly known as: NORVASC TAKE 1 TABLET BY MOUTH EVERY DAY   cephALEXin 500 MG capsule Commonly known as: Keflex Take 1 capsule (500 mg total) by mouth 4 (four) times daily for 4 days.   cetirizine 10 MG tablet Commonly known as: ZYRTEC Take 10 mg by mouth daily as needed for allergies. Notes to patient: 08/10/19   citalopram 10 MG tablet Commonly known as: CELEXA Take 10 mg by mouth daily.   fluticasone 50 MCG/ACT nasal spray Commonly known as: FLONASE USE  1 SPRAY IN EACH NOSTRIL ONCE A DAY AS NEEDED FOR ALLERGIES OR RHINITIS What changed: See the new instructions.   fluticasone furoate-vilanterol 200-25 MCG/INH Aepb Commonly known as: Breo Ellipta Inhale 1 puff into the lungs daily.   furosemide 40 MG tablet Commonly known as: LASIX Take 1 tablet (40 mg total) by mouth daily as needed for fluid.   latanoprost 0.005 % ophthalmic solution Commonly known as: XALATAN Place 1 drop into both eyes at bedtime.   lisinopril 40 MG tablet Commonly  known as: ZESTRIL TAKE 1 TABLET BY MOUTH EVERY DAY   metoprolol tartrate 25 MG tablet Commonly known as: LOPRESSOR Take 0.5 tablets (12.5 mg total) by mouth 2 (two) times daily.   montelukast 10 MG tablet Commonly known as: SINGULAIR TAKE 1 TABLET BY MOUTH EVERYDAY AT BEDTIME What changed: See the new instructions.   MULTIVITAMIN & MINERAL PO Take 1 tablet by mouth daily.   pilocarpine 1 % ophthalmic solution Commonly known as: PILOCAR Place 1 drop into both eyes 2 (two) times daily.   predniSONE 10 MG tablet Commonly known as: DELTASONE 4 tabs for 2 days, then 3 tabs for 2 days, 2 tabs for 2 days, then 1 tab for 2 days, then stop What changed:   how much to take  how to take this  when to take this  additional instructions   traZODone 100 MG tablet Commonly known as: DESYREL Take 100 mg by mouth at bedtime as needed for sleep.   warfarin 2.5 MG tablet Commonly known as: COUMADIN Take 1-1.5 tablets (2.5-3.75 mg total) by mouth daily. Take 7.5mg  (3 tablets) for the next 3 days (today, Saturday and Sunday). On Monday check INR and resume your normal dosing as noted below.  Take 3.75 mg (1.5 tablets) on Sun, Tues, Weds, Thurs, Sat & Take 2.5 mg (1 tablet) on Mon & Fri What changed:   how much to take  when to take this  additional instructions       Disposition and follow-up:   Ms.Jimi B Stegman was discharged from [facility] in Stable condition.  At the hospital follow up visit please address:  1.  Left lower extremity cellulitis: Patient presented with acute on chronic left lower extremity pain w/erythema and tenderness to palpation. Doppler US neg for DVT. Treated with ceftriaxone w/improvement in pain and leukocytosis. Discharged on Keflex for 4 days.   2. Persistent A.fib w/RVR: Patient noted to have A.fib w/RVR to 130s on presentation in setting of medication noncompliance at home that improved with metoprolol. Patient discharged  to continue metoprolol 12.5mg  bid at home.   3. Supratherapeutic INR: Patient w Hx of A.fib w/CHADSVASC2 score of 6 on chronic warfarin therapy. INR >10 on presentation with extensive bruising noted in bilateral upper extremities w/o active bleeding. Warfarin held and given vitamin K with improvement of INR to 5. Patient instructed to hold next 2 doses of warfarin and follow up with PCP and coumadin clinic for warfarin dosing adjustments.   2.  Labs / imaging needed at time of follow-up: PT/INR, CBC  3.  Pending labs/ test needing follow-up: none  Follow-up Appointments: Follow-up Information    Julian Hy, PA-C. Schedule an appointment as soon as possible for a visit in 1 week(s).   Specialty: Physician Assistant Contact information: [place]  [place] [address] [place], [facility] Follow up.   Specialty: Home Health Services Why: The home health agency  will contact you for the first home visit. Contact information: [address] [place] [address] [phone]           Hospital Course by problem list: Left lower extremity cellulitis:  Patient presented to ED with acutely worsening chronic left lower extremity pain and found to have cellulitis on examination. Initial concern for DVT but given that she is on chronic warfarin therapy and supratherapeutic INR with negative Doppler US, this would make DVT less likely. Patient started on ceftriaxone with significant improvement in pain and erythema and tenderness and no side effects noted (listed allergy to penicillins and cephalosporins of itching). Patient transitioned to oral keflex to complete 5 day course of antibiotics. Patient evaluated by PT/OT and recommended for home health PT.   Persistent A.fib with RVR:  Supratherapeutic INR:  Patient noted to have A.fib with RVR on presentation with HR up to 130s. Per discussion with daughter, patient has been  noncompliant with her metoprolol as it "doesn't make her feel good"; however, unable to clarify this. Patient was restarted on metoprolol 12.5mg  bid and tolerated it well with improvement in her heart rate. Patient discharged home to continue metoprolol as prescribed and to follow up with her cardiologist for any adjustments.  She was also noted to have a supratherapeutic INR of >10 with extensive bruising noted on bilateral upper extremities; however, no active bleeding noted on exam. Her INR improved with holding warfarin and administering vitamin K. Repeat INR 5.0. Patient instructed to hold warfarin for 2 days and follow up with PCP for INR monitoring and repeat CBC.   Discharge Vitals:   BP 104/64 (BP Location: Right Arm)   Pulse 86   Temp 97.9 F (36.6 C) (Oral)   Resp 18   SpO2 99%   Pertinent Labs, Studies, and Procedures:  BP 104/64 (BP Location: Right Arm)   Pulse 86   Temp 97.9 F (36.6 C) (Oral)   Resp 18   SpO2 99%   CBC Latest Ref Rng & Units 08/09/2019 08/08/2019 02/20/2019  WBC 4.0 - 10.5 K/uL 11.7(H) 20.1(H) 6.1  Hemoglobin 12.0 - 15.0 g/dL 9.4(L) 11.3(L) 10.4(L)  Hematocrit 36.0 - 46.0 % 28.0(L) 34.0(L) 32.5(L)  Platelets 150 - 400 K/uL PLATELET CLUMPS NOTED ON SMEAR, UNABLE TO ESTIMATE PLATELET CLUMPS NOTED ON SMEAR, UNABLE TO ESTIMATE 135(L)   BMP Latest Ref Rng & Units 08/08/2019 02/20/2019 01/28/2019  Glucose 70 - 99 mg/dL 110(H) 115(H) 113(H)  BUN 8 - 23 mg/dL 25(H) 16 18  Creatinine 0.44 - 1.00 mg/dL 0.94 0.74 0.84  Sodium 135 - 145 mmol/L 135 138 138  Potassium 3.5 - 5.1 mmol/L 4.5 4.5 4.6  Chloride 98 - 111 mmol/L 101 102 105  CO2 22 - 32 mmol/L 23 28 26   Calcium 8.9 - 10.3 mg/dL 8.5(L) 8.7(L) 8.9   INR/Prothrombin Time 82.7/ >10 >> 46.7/5.0  CXR 08/08/2019: IMPRESSION: 1. Chronic left basilar consolidation and likely small effusion  Discharge Instructions: Discharge Instructions    Call MD for:  difficulty breathing, headache or visual disturbances    Complete by: As directed    Call MD for:  extreme fatigue   Complete by: As directed    Call MD for:  hives   Complete by: As directed    Call MD for:  persistant dizziness or light-headedness   Complete by: As directed    Call MD for:  redness, tenderness, or signs of infection (pain, swelling, redness, odor or green/yellow discharge around incision site)  Complete by: As directed    Call MD for:  severe uncontrolled pain   Complete by: As directed    Call MD for:  temperature >100.4   Complete by: As directed    Diet - low sodium heart healthy   Complete by: As directed    Discharge instructions   Complete by: As directed    Ms. Postlewaite,   You were admitted to the hospital for left leg pain due to cellulitis. Your pain and cellulitis improved with antibiotics. On discharge, please continue to take Keflex (cephalexin) four times a day for total of 4 days. If your pain or the redness in your left leg worsens, please seek medical care. If you have develop worsening rash, hives or trouble breathing while on the medication, please seek emergent medical care.  Please continue to take metoprolol as prescribed (12.5mg  twice daily) for your atrial fibrillation. Please schedule an appointment to follow up with your cardiologist at your earliest convenience for any medication adjustments.  For your warfarin/coumadin, you were noted to have significantly elevated warfarin levels on admission. At this time, please hold your warfarin for the next two days. Please schedule an appointment for Friday 2/5 with your PCP or coumadin clinic to check your coumadin levels and for adjustments to your warfarin.   Thank you!   Face-to-face encounter (required for Medicare/Medicaid patients)   Complete by: As directed    I Loralyn Freshwater Semaja Lymon certify that this patient is under my care and that I, or a nurse practitioner or physician's assistant working with me, had a face-to-face encounter that meets the physician  face-to-face encounter requirements with this patient on 08/09/2019. The encounter with the patient was in whole, or in part for the following medical condition(s) which is the primary reason for home health care (List medical condition): deconditioning, need strength/endurance exercise training   The encounter with the patient was in whole, or in part, for the following medical condition, which is the primary reason for home health care: deconditioning, strength/endurance exercises   I certify that, based on my findings, the following services are medically necessary home health services: Physical therapy   Reason for Medically Necessary Home Health Services: Therapy- Therapeutic Exercises to Increase Strength and Endurance   My clinical findings support the need for the above services: Pain interferes with ambulation/mobility   Further, I certify that my clinical findings support that this patient is homebound due to: Pain interferes with ambulation/mobility   Home Health   Complete by: As directed    To provide the following care/treatments: PT   Increase activity slowly   Complete by: As directed       Signed: Harvie Heck, MD  Internal Medicine, PGY-1 Pager: [phone]  08/10/2019, 4:18 PM

## 2019-08-11 ENCOUNTER — Ambulatory Visit: Payer: Medicare HMO

## 2019-08-13 LAB — CULTURE, BLOOD (SINGLE)
Culture: NO GROWTH
Special Requests: ADEQUATE

## 2019-08-23 ENCOUNTER — Institutional Professional Consult (permissible substitution): Payer: Medicare HMO | Admitting: Internal Medicine

## 2019-08-31 ENCOUNTER — Other Ambulatory Visit: Payer: Self-pay | Admitting: Internal Medicine

## 2019-09-15 ENCOUNTER — Other Ambulatory Visit: Payer: Self-pay | Admitting: Internal Medicine

## 2019-09-20 ENCOUNTER — Other Ambulatory Visit: Payer: Self-pay | Admitting: Internal Medicine

## 2019-09-20 ENCOUNTER — Encounter: Payer: Self-pay | Admitting: Internal Medicine

## 2019-09-20 ENCOUNTER — Ambulatory Visit: Payer: Medicare HMO | Admitting: Internal Medicine

## 2019-09-20 ENCOUNTER — Other Ambulatory Visit: Payer: Self-pay

## 2019-09-20 VITALS — BP 110/66 | HR 86 | Temp 97.1°F | Ht 63.0 in | Wt 132.4 lb

## 2019-09-20 DIAGNOSIS — R0600 Dyspnea, unspecified: Secondary | ICD-10-CM | POA: Diagnosis not present

## 2019-09-20 DIAGNOSIS — J449 Chronic obstructive pulmonary disease, unspecified: Secondary | ICD-10-CM

## 2019-09-20 DIAGNOSIS — R600 Localized edema: Secondary | ICD-10-CM

## 2019-09-20 DIAGNOSIS — I4821 Permanent atrial fibrillation: Secondary | ICD-10-CM

## 2019-09-20 DIAGNOSIS — R0609 Other forms of dyspnea: Secondary | ICD-10-CM

## 2019-09-20 NOTE — Patient Instructions (Addendum)
The patient should have follow up scheduled with myself in 3 months.   Prior to next visit patient should have: Resume Xolair injections Get ultrasound of your heart - echocardiogram

## 2019-09-20 NOTE — Progress Notes (Signed)
[name]    [id number]    1924-03-01  Primary Care Physician:Bujanowski, Ramond Craver Date of Appointment: 09/20/2019 Established Patient Visit  Chief complaint:   Chief Complaint  Patient presents with  . Pulmonary Consult    Former Dr. Lake Bells patient with Asthma. Patient reports that shes doing ok at this time and plans to re-start her Xolair injections.      HPI: Former patient of Dr. Lake Bells with asthma and COPD overlap syndrome. Granddaughter here with her today to help provide history. Has been on Xolair in the past with good response, but stopped due to cost. Was receiving through patient assistance but these were discussed due to covid pandemic in May 2020.  Has had recurrent prednisone tapers- last in Jan 2021.  Doesn't feel that albuterol helps much Recent admission for cellulitis  Interval Updates: Accompanied by her granddaughter today.  Current Regimen: Breo Ellipta 200, Singulair, prn albuterol (not using) Asthma Triggers: allergies Exacerbations in the last year: yes 2, most recently jan 2021 History of hospitalization or intubation: Hives:denies Allergy Testing: yes. [place]ast 2006:  IgE 855, multiple significant allergens. 06/2015 IgE 872 kU/L, 06/2018 IgE 595 kU/L GERD: denies Allergic Rhinitis: denies ACT:  Asthma Control Test ACT Total Score  09/20/2019 17   FeNO: 15 ppb in 2019   I have reviewed the patient's family social and past medical history and updated as appropriate.   Past Medical History:  Diagnosis Date  . Anxiety   . Arthritis   . CAD (coronary artery disease) 2007   a. 1997 s/p PCI of diagonal.  . Constipation   . COPD/Asthma   . Diverticulosis   . Esophageal reflux   . Family history of adverse reaction to anesthesia    daughter had trouble breathing after surgery, had to be reintubated  . Glaucoma   . HTN (hypertension), benign   . Hyperlipidemia   . Malignant melanoma ([facility])    a. 09/2012 s/p resection.   . Neuropathy    lower legs  . PAF (paroxysmal atrial fibrillation) (HCC)    a. intolerant to beta blockers due to asthma and intolerant to CCB due to rash;  b. CHA2DS2VASc = 6-->chronic coumadin;  c. 03/2014 Echo: EF 60-65%, Gr 1 DD, triv AI, mildly dil RV, PASP 59mmhg.  [name] TIA (transient ischemic attack)    a. 02/2011 - chronic coumadin in setting of PAF.  [name] Urinary, incontinence, stress female     Past Surgical History:  Procedure Laterality Date  . ABDOMINAL HYSTERECTOMY    . APPENDECTOMY    . BACK SURGERY     lumbar laminectomy  . CHOLECYSTECTOMY    . COLONOSCOPY    . CORONARY ANGIOPLASTY WITH STENT PLACEMENT    . EYE SURGERY    . NASAL SINUS SURGERY    . VESICOVAGINAL FISTULA CLOSURE W/ TAH    . WEIL OSTEOTOMY Left 06/12/2016   Procedure: Left Chevron and Aiken Left [name], Weil Osteotomy 2nd and 3rd Metatarsal;  Surgeon: Newt Minion, MD;  Location: [facility];  Service: Orthopedics;  Laterality: Left;    Family History  Problem Relation Age of Onset  . Heart disease Father   . Hemachromatosis Father   . Heart attack Father   . Colon cancer Mother   . Peripheral vascular disease Mother   . Peripheral vascular disease Sister   . Cancer Sister   . Lymphoma Sister   . Heart attack Brother   .  Heart disease Brother     Social History   Occupational History  . 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   Tobacco Use  . Smoking status: Former Smoker    Packs/day: 0.20    Years: 25.00    Pack years: 5.00    Types: Cigarettes    Quit date: 07/06/1966    Years since quitting: 53.2  . Smokeless tobacco: Never Used  Substance and Sexual Activity  . Alcohol use: No    Alcohol/week: 0.0 standard drinks  . Drug use: No  . Sexual activity: Not on file    Review of systems: Constitutional: No fevers, chills, night sweats, or weight loss. CV: No chest pain, or palpitations. Resp: No hemoptysis.  Physical Exam: Blood pressure 110/66,  pulse 86, temperature (!) 97.1 F (36.2 C), temperature source Temporal, height 5\' 3"  (1.6 m), weight 132 lb 6.4 oz (60.1 kg), SpO2 95 %.  Gen:     Elderly woman, No acute distress ENT:  no nasal polyps, mucus membranes moist Lungs:    Kyphosis, No increased respiratory effort, symmetric chest wall excursion, clear to auscultation bilaterally, no wheezes or crackles CV:         Regular rate and rhythm; no murmurs, rubs, or gallops.  Bilateral 2+ pitting edema   Data Reviewed: Imaging: I have personally reviewed the chest xray from Feb 2021. Chronic hyperinflation with left sided atelectasis  PFTs:  PFT Results Latest Ref Rng & Units 07/29/2018  FVC-Pre L 1.48  FVC-Predicted Pre % 64  FVC-Post L 1.60  FVC-Predicted Post % 69  Pre FEV1/FVC % % 60  Post FEV1/FCV % % 61  FEV1-Pre L 0.88  FEV1-Predicted Pre % 52  FEV1-Post L 0.98  DLCO UNC% % 49  DLCO COR %Predicted % 85  TLC L 4.69  TLC % Predicted % 87  RV % Predicted % 119   I have personally reviewed the patient's PFTs and they show moderately severe airflow limitation FEV1 57% of predicted with a borderline significant BD response. Diffusion capacity is reduced.   Discussed with Wyn Quaker NP who recently saw the patient  Labs: Elevated Eos on CBC 08/2014 Immunization status: Immunization History  Administered Date(s) Administered  . Influenza Split 04/08/2011, 04/05/2012  . Influenza Whole 05/05/2006, 04/18/2009, 04/23/2010, 04/06/2011  . Influenza, High Dose Seasonal PF 04/25/2013, 03/27/2016, 04/23/2017, 05/13/2018, 06/06/2019  . Influenza,inj,Quad PF,6+ Mos 06/05/2014, 06/07/2015  . Influenza,trivalent, recombinat, inj, PF 07/06/2009, 04/05/2013  . PFIZER SARS-COV-2 Vaccination 08/04/2019, 08/25/2019  . Pneumococcal Conjugate-13 07/22/2010  . Pneumococcal Polysaccharide-23 07/06/2004  . Tdap 07/06/2009    Assessment:  Asthma COPD Overlap Syndrome FEV1 57% of predicted, moderately severe. Poorly controlled.  Steroid dependent.  Peripheral Eosinophilia Elevated IgE Lower Extremity Edema - concern for decompensated heart failure Atrial Fibrillation - rate controlled on AC.   Plan/Recommendations: Continue Breo, prn albuterol, singulair Will resume Xolair injections. I have forwarded to Biologics coordinator as we do still have some of her injections in office and will get those started. She will make nursing appt.  Will re-evaluate her cardiac funciton with echo Elevate legs Compression hose.   Will call granddaughter Colletta Maryland with results  I spent 42 minutes on 09/20/2019 in care of this patient including face to face time and non-face to face time spent charting, review of outside records, and coordination of care.  Return to Care: Return in about 3 months (around 12/07/2019).   Lenice Llamas, MD Pulmonary and Critical  [place]

## 2019-10-01 ENCOUNTER — Other Ambulatory Visit: Payer: Self-pay | Admitting: Internal Medicine

## 2019-10-02 ENCOUNTER — Other Ambulatory Visit: Payer: Self-pay | Admitting: Internal Medicine

## 2019-10-10 NOTE — Telephone Encounter (Signed)
TP could you advise for Dr. Shearon Stalls?

## 2019-10-10 NOTE — Telephone Encounter (Signed)
Im sorry to hear this. She will need to be seen by her PCP for evaluation for cellulitis and determine if abx are indicated. Can we help her get an appointment with them    Please contact office for sooner follow up if symptoms do not improve or worsen or seek emergency care

## 2019-10-11 NOTE — Telephone Encounter (Signed)
I have no idea if she needs antibiotics or not - She needs to be examined and evaluated in person either in an PCP or urgent care visit, or the ED. She was seen by the resident teaching service and was supposed to follow up with Julian Hy Pa-C after discharge from hospital.

## 2019-10-17 ENCOUNTER — Other Ambulatory Visit: Payer: Self-pay | Admitting: Internal Medicine

## 2019-10-21 ENCOUNTER — Emergency Department (HOSPITAL_COMMUNITY): Payer: Medicare HMO

## 2019-10-21 ENCOUNTER — Inpatient Hospital Stay (HOSPITAL_COMMUNITY): Payer: Medicare HMO

## 2019-10-21 ENCOUNTER — Inpatient Hospital Stay (HOSPITAL_COMMUNITY)
Admission: EM | Admit: 2019-10-21 | Discharge: 2019-10-31 | DRG: 474 | Disposition: A | Discharge status: Skilled Nursing Facility | Payer: Medicare HMO | Attending: Internal Medicine | Admitting: Internal Medicine

## 2019-10-21 DIAGNOSIS — H409 Unspecified glaucoma: Secondary | ICD-10-CM | POA: Diagnosis present

## 2019-10-21 DIAGNOSIS — Z955 Presence of coronary angioplasty implant and graft: Secondary | ICD-10-CM

## 2019-10-21 DIAGNOSIS — H401122 Primary open-angle glaucoma, left eye, moderate stage: Secondary | ICD-10-CM | POA: Diagnosis present

## 2019-10-21 DIAGNOSIS — R791 Abnormal coagulation profile: Secondary | ICD-10-CM | POA: Diagnosis not present

## 2019-10-21 DIAGNOSIS — M869 Osteomyelitis, unspecified: Principal | ICD-10-CM

## 2019-10-21 DIAGNOSIS — I959 Hypotension, unspecified: Secondary | ICD-10-CM | POA: Diagnosis not present

## 2019-10-21 DIAGNOSIS — Z800 Family history of malignant neoplasm of digestive organs: Secondary | ICD-10-CM

## 2019-10-21 DIAGNOSIS — M7989 Other specified soft tissue disorders: Secondary | ICD-10-CM | POA: Diagnosis not present

## 2019-10-21 DIAGNOSIS — I110 Hypertensive heart disease with heart failure: Secondary | ICD-10-CM | POA: Diagnosis present

## 2019-10-21 DIAGNOSIS — E876 Hypokalemia: Secondary | ICD-10-CM | POA: Diagnosis not present

## 2019-10-21 DIAGNOSIS — R1310 Dysphagia, unspecified: Secondary | ICD-10-CM

## 2019-10-21 DIAGNOSIS — Z888 Allergy status to other drugs, medicaments and biological substances status: Secondary | ICD-10-CM

## 2019-10-21 DIAGNOSIS — G629 Polyneuropathy, unspecified: Secondary | ICD-10-CM | POA: Diagnosis present

## 2019-10-21 DIAGNOSIS — I2510 Atherosclerotic heart disease of native coronary artery without angina pectoris: Secondary | ICD-10-CM | POA: Diagnosis present

## 2019-10-21 DIAGNOSIS — J9601 Acute respiratory failure with hypoxia: Secondary | ICD-10-CM | POA: Diagnosis present

## 2019-10-21 DIAGNOSIS — L03115 Cellulitis of right lower limb: Secondary | ICD-10-CM | POA: Diagnosis present

## 2019-10-21 DIAGNOSIS — Z66 Do not resuscitate: Secondary | ICD-10-CM | POA: Diagnosis present

## 2019-10-21 DIAGNOSIS — Z20822 Contact with and (suspected) exposure to covid-19: Secondary | ICD-10-CM | POA: Diagnosis present

## 2019-10-21 DIAGNOSIS — I714 Abdominal aortic aneurysm, without rupture, unspecified: Secondary | ICD-10-CM | POA: Diagnosis present

## 2019-10-21 DIAGNOSIS — D539 Nutritional anemia, unspecified: Secondary | ICD-10-CM | POA: Diagnosis not present

## 2019-10-21 DIAGNOSIS — J449 Chronic obstructive pulmonary disease, unspecified: Secondary | ICD-10-CM | POA: Diagnosis present

## 2019-10-21 DIAGNOSIS — I4891 Unspecified atrial fibrillation: Secondary | ICD-10-CM | POA: Diagnosis not present

## 2019-10-21 DIAGNOSIS — B966 Bacteroides fragilis [B. fragilis] as the cause of diseases classified elsewhere: Secondary | ICD-10-CM | POA: Diagnosis present

## 2019-10-21 DIAGNOSIS — L0390 Cellulitis, unspecified: Secondary | ICD-10-CM

## 2019-10-21 DIAGNOSIS — L97429 Non-pressure chronic ulcer of left heel and midfoot with unspecified severity: Secondary | ICD-10-CM | POA: Diagnosis present

## 2019-10-21 DIAGNOSIS — L97509 Non-pressure chronic ulcer of other part of unspecified foot with unspecified severity: Secondary | ICD-10-CM

## 2019-10-21 DIAGNOSIS — Z7901 Long term (current) use of anticoagulants: Secondary | ICD-10-CM

## 2019-10-21 DIAGNOSIS — K219 Gastro-esophageal reflux disease without esophagitis: Secondary | ICD-10-CM | POA: Diagnosis present

## 2019-10-21 DIAGNOSIS — I5033 Acute on chronic diastolic (congestive) heart failure: Secondary | ICD-10-CM | POA: Diagnosis present

## 2019-10-21 DIAGNOSIS — R6510 Systemic inflammatory response syndrome (SIRS) of non-infectious origin without acute organ dysfunction: Secondary | ICD-10-CM | POA: Diagnosis present

## 2019-10-21 DIAGNOSIS — Z8673 Personal history of transient ischemic attack (TIA), and cerebral infarction without residual deficits: Secondary | ICD-10-CM | POA: Diagnosis not present

## 2019-10-21 DIAGNOSIS — Z807 Family history of other malignant neoplasms of lymphoid, hematopoietic and related tissues: Secondary | ICD-10-CM

## 2019-10-21 DIAGNOSIS — N179 Acute kidney failure, unspecified: Secondary | ICD-10-CM | POA: Diagnosis present

## 2019-10-21 DIAGNOSIS — E8770 Fluid overload, unspecified: Secondary | ICD-10-CM

## 2019-10-21 DIAGNOSIS — R7881 Bacteremia: Secondary | ICD-10-CM | POA: Diagnosis present

## 2019-10-21 DIAGNOSIS — M86171 Other acute osteomyelitis, right ankle and foot: Principal | ICD-10-CM | POA: Diagnosis present

## 2019-10-21 DIAGNOSIS — I96 Gangrene, not elsewhere classified: Secondary | ICD-10-CM | POA: Diagnosis present

## 2019-10-21 DIAGNOSIS — E785 Hyperlipidemia, unspecified: Secondary | ICD-10-CM | POA: Diagnosis present

## 2019-10-21 DIAGNOSIS — M84474A Pathological fracture, right foot, initial encounter for fracture: Secondary | ICD-10-CM | POA: Diagnosis present

## 2019-10-21 DIAGNOSIS — Z9114 Patient's other noncompliance with medication regimen: Secondary | ICD-10-CM

## 2019-10-21 DIAGNOSIS — Z91041 Radiographic dye allergy status: Secondary | ICD-10-CM

## 2019-10-21 DIAGNOSIS — Z8249 Family history of ischemic heart disease and other diseases of the circulatory system: Secondary | ICD-10-CM

## 2019-10-21 DIAGNOSIS — Z881 Allergy status to other antibiotic agents status: Secondary | ICD-10-CM

## 2019-10-21 DIAGNOSIS — L89610 Pressure ulcer of right heel, unstageable: Secondary | ICD-10-CM | POA: Diagnosis present

## 2019-10-21 DIAGNOSIS — N393 Stress incontinence (female) (male): Secondary | ICD-10-CM | POA: Diagnosis present

## 2019-10-21 DIAGNOSIS — F419 Anxiety disorder, unspecified: Secondary | ICD-10-CM | POA: Diagnosis not present

## 2019-10-21 DIAGNOSIS — Z880 Allergy status to penicillin: Secondary | ICD-10-CM

## 2019-10-21 DIAGNOSIS — K5790 Diverticulosis of intestine, part unspecified, without perforation or abscess without bleeding: Secondary | ICD-10-CM | POA: Diagnosis present

## 2019-10-21 DIAGNOSIS — T45515A Adverse effect of anticoagulants, initial encounter: Secondary | ICD-10-CM | POA: Diagnosis present

## 2019-10-21 DIAGNOSIS — R0602 Shortness of breath: Secondary | ICD-10-CM | POA: Diagnosis present

## 2019-10-21 DIAGNOSIS — H401111 Primary open-angle glaucoma, right eye, mild stage: Secondary | ICD-10-CM | POA: Diagnosis present

## 2019-10-21 DIAGNOSIS — E039 Hypothyroidism, unspecified: Secondary | ICD-10-CM | POA: Diagnosis present

## 2019-10-21 DIAGNOSIS — Z882 Allergy status to sulfonamides status: Secondary | ICD-10-CM

## 2019-10-21 DIAGNOSIS — Z85820 Personal history of malignant melanoma of skin: Secondary | ICD-10-CM

## 2019-10-21 DIAGNOSIS — I4821 Permanent atrial fibrillation: Secondary | ICD-10-CM | POA: Diagnosis present

## 2019-10-21 DIAGNOSIS — Z01810 Encounter for preprocedural cardiovascular examination: Secondary | ICD-10-CM | POA: Diagnosis not present

## 2019-10-21 DIAGNOSIS — Z87891 Personal history of nicotine dependence: Secondary | ICD-10-CM

## 2019-10-21 DIAGNOSIS — I5031 Acute diastolic (congestive) heart failure: Secondary | ICD-10-CM | POA: Diagnosis not present

## 2019-10-21 DIAGNOSIS — Z886 Allergy status to analgesic agent status: Secondary | ICD-10-CM

## 2019-10-21 DIAGNOSIS — I712 Thoracic aortic aneurysm, without rupture: Secondary | ICD-10-CM | POA: Diagnosis present

## 2019-10-21 DIAGNOSIS — I272 Pulmonary hypertension, unspecified: Secondary | ICD-10-CM | POA: Diagnosis present

## 2019-10-21 LAB — CBC WITH DIFFERENTIAL/PLATELET
Abs Immature Granulocytes: 0.03 10*3/uL (ref 0.00–0.07)
Basophils Absolute: 0 10*3/uL (ref 0.0–0.1)
Basophils Relative: 0 %
Eosinophils Absolute: 0 10*3/uL (ref 0.0–0.5)
Eosinophils Relative: 0 %
HCT: 26.1 % — ABNORMAL LOW (ref 36.0–46.0)
Hemoglobin: 8.3 g/dL — ABNORMAL LOW (ref 12.0–15.0)
Immature Granulocytes: 0 %
Lymphocytes Relative: 2 %
Lymphs Abs: 0.2 10*3/uL — ABNORMAL LOW (ref 0.7–4.0)
MCH: 31.9 pg (ref 26.0–34.0)
MCHC: 31.8 g/dL (ref 30.0–36.0)
MCV: 100.4 fL — ABNORMAL HIGH (ref 80.0–100.0)
Monocytes Absolute: 0.1 10*3/uL (ref 0.1–1.0)
Monocytes Relative: 1 %
Neutro Abs: 8 10*3/uL — ABNORMAL HIGH (ref 1.7–7.7)
Neutrophils Relative %: 97 %
Platelets: 121 10*3/uL — ABNORMAL LOW (ref 150–400)
RBC: 2.6 MIL/uL — ABNORMAL LOW (ref 3.87–5.11)
RDW: 19.4 % — ABNORMAL HIGH (ref 11.5–15.5)
WBC: 8.4 10*3/uL (ref 4.0–10.5)
nRBC: 0 % (ref 0.0–0.2)

## 2019-10-21 LAB — COMPREHENSIVE METABOLIC PANEL
ALT: 16 U/L (ref 0–44)
AST: 24 U/L (ref 15–41)
Albumin: 3.2 g/dL — ABNORMAL LOW (ref 3.5–5.0)
Alkaline Phosphatase: 62 U/L (ref 38–126)
Anion gap: 12 (ref 5–15)
BUN: 26 mg/dL — ABNORMAL HIGH (ref 8–23)
CO2: 24 mmol/L (ref 22–32)
Calcium: 8.5 mg/dL — ABNORMAL LOW (ref 8.9–10.3)
Chloride: 98 mmol/L (ref 98–111)
Creatinine, Ser: 1.24 mg/dL — ABNORMAL HIGH (ref 0.44–1.00)
GFR calc Af Amer: 43 mL/min — ABNORMAL LOW (ref >60)
GFR calc non Af Amer: 37 mL/min — ABNORMAL LOW (ref >60)
Glucose, Bld: 143 mg/dL — ABNORMAL HIGH (ref 70–99)
Potassium: 3.6 mmol/L (ref 3.5–5.1)
Sodium: 134 mmol/L — ABNORMAL LOW (ref 135–145)
Total Bilirubin: 1 mg/dL (ref 0.3–1.2)
Total Protein: 6.7 g/dL (ref 6.5–8.1)

## 2019-10-21 LAB — LACTIC ACID, PLASMA
Lactic Acid, Venous: 0.9 mmol/L (ref 0.5–1.9)
Lactic Acid, Venous: 0.9 mmol/L (ref 0.5–1.9)

## 2019-10-21 LAB — POC OCCULT BLOOD, ED: Fecal Occult Bld: NEGATIVE

## 2019-10-21 LAB — TROPONIN I (HIGH SENSITIVITY)
Troponin I (High Sensitivity): 4 ng/L (ref <18)
Troponin I (High Sensitivity): 6 ng/L (ref <18)

## 2019-10-21 LAB — PROTIME-INR
INR: 9.3 (ref 0.8–1.2)
Prothrombin Time: 76 seconds — ABNORMAL HIGH (ref 11.4–15.2)

## 2019-10-21 LAB — C-REACTIVE PROTEIN: CRP: 9.2 mg/dL — ABNORMAL HIGH (ref <1.0)

## 2019-10-21 LAB — SEDIMENTATION RATE: Sed Rate: 85 mm/hr — ABNORMAL HIGH (ref 0–22)

## 2019-10-21 LAB — SARS CORONAVIRUS 2 (TAT 6-24 HRS): SARS Coronavirus 2: NEGATIVE

## 2019-10-21 LAB — BRAIN NATRIURETIC PEPTIDE: B Natriuretic Peptide: 275.2 pg/mL — ABNORMAL HIGH (ref 0.0–100.0)

## 2019-10-21 MED ORDER — LATANOPROST 0.005 % OP SOLN
1.0000 [drp] | Freq: Every day | OPHTHALMIC | Status: DC
  Administered 2019-10-21 – 2019-10-30 (×10): 1 [drp] via OPHTHALMIC
  Filled 2019-10-21: qty 2.5

## 2019-10-21 MED ORDER — POTASSIUM CHLORIDE CRYS ER 20 MEQ PO TBCR
40.0000 meq | EXTENDED_RELEASE_TABLET | ORAL | Status: AC
  Administered 2019-10-21: 12:00:00 40 meq via ORAL
  Filled 2019-10-21: qty 2

## 2019-10-21 MED ORDER — MONTELUKAST SODIUM 10 MG PO TABS
10.0000 mg | ORAL_TABLET | Freq: Every day | ORAL | Status: DC
  Administered 2019-10-21 – 2019-10-30 (×10): 10 mg via ORAL
  Filled 2019-10-21 (×10): qty 1

## 2019-10-21 MED ORDER — VANCOMYCIN HCL IN DEXTROSE 1-5 GM/200ML-% IV SOLN
1000.0000 mg | INTRAVENOUS | Status: DC
  Administered 2019-10-23: 10:00:00 1000 mg via INTRAVENOUS
  Filled 2019-10-21: qty 200

## 2019-10-21 MED ORDER — METOPROLOL TARTRATE 25 MG PO TABS: 12.5000 mg | ORAL_TABLET | Freq: Two times a day (BID) | ORAL | Status: DC

## 2019-10-21 MED ORDER — SODIUM CHLORIDE 0.9 % IV BOLUS
250.0000 mL | Freq: Once | INTRAVENOUS | Status: AC
  Administered 2019-10-21: 18:00:00 250 mL via INTRAVENOUS

## 2019-10-21 MED ORDER — DIPHENHYDRAMINE HCL 25 MG PO CAPS
25.0000 mg | ORAL_CAPSULE | Freq: Every day | ORAL | Status: DC | PRN
  Administered 2019-10-24: 25 mg via ORAL
  Filled 2019-10-21 (×2): qty 1

## 2019-10-21 MED ORDER — ONDANSETRON HCL 4 MG/2ML IJ SOLN
4.0000 mg | Freq: Four times a day (QID) | INTRAMUSCULAR | Status: DC | PRN
  Administered 2019-10-27 – 2019-10-30 (×3): 4 mg via INTRAVENOUS
  Filled 2019-10-21 (×3): qty 2

## 2019-10-21 MED ORDER — FUROSEMIDE 10 MG/ML IJ SOLN
20.0000 mg | Freq: Two times a day (BID) | INTRAMUSCULAR | Status: DC
  Administered 2019-10-21: 12:00:00 20 mg via INTRAVENOUS
  Filled 2019-10-21: qty 2

## 2019-10-21 MED ORDER — SODIUM CHLORIDE 0.9 % IV SOLN: Freq: Once | INTRAVENOUS | Status: AC

## 2019-10-21 MED ORDER — ALPRAZOLAM 0.25 MG PO TABS
0.5000 mg | ORAL_TABLET | Freq: Once | ORAL | Status: AC
  Administered 2019-10-21: 03:00:00 0.5 mg via ORAL
  Filled 2019-10-21: qty 2

## 2019-10-21 MED ORDER — ALBUTEROL SULFATE HFA 108 (90 BASE) MCG/ACT IN AERS
2.0000 | INHALATION_SPRAY | Freq: Once | RESPIRATORY_TRACT | Status: AC
  Administered 2019-10-21: 2 via RESPIRATORY_TRACT
  Filled 2019-10-21: qty 6.7

## 2019-10-21 MED ORDER — SODIUM CHLORIDE 0.9 % IV BOLUS
250.0000 mL | Freq: Once | INTRAVENOUS | Status: AC
  Administered 2019-10-21: 15:00:00 250 mL via INTRAVENOUS

## 2019-10-21 MED ORDER — SODIUM CHLORIDE 0.9% FLUSH
3.0000 mL | Freq: Two times a day (BID) | INTRAVENOUS | Status: DC
  Administered 2019-10-22 – 2019-10-28 (×13): 3 mL via INTRAVENOUS

## 2019-10-21 MED ORDER — ACETAMINOPHEN 650 MG RE SUPP: 650.0000 mg | Freq: Four times a day (QID) | RECTAL | Status: DC | PRN

## 2019-10-21 MED ORDER — ACETAMINOPHEN 325 MG PO TABS
650.0000 mg | ORAL_TABLET | Freq: Four times a day (QID) | ORAL | Status: DC | PRN
  Administered 2019-10-23 – 2019-10-30 (×7): 650 mg via ORAL
  Filled 2019-10-21 (×7): qty 2

## 2019-10-21 MED ORDER — SODIUM CHLORIDE 0.9 % IV BOLUS
250.0000 mL | Freq: Once | INTRAVENOUS | Status: AC
  Administered 2019-10-21: 16:00:00 250 mL via INTRAVENOUS

## 2019-10-21 MED ORDER — ONDANSETRON HCL 4 MG PO TABS
4.0000 mg | ORAL_TABLET | Freq: Four times a day (QID) | ORAL | Status: DC | PRN
  Administered 2019-10-29: 4 mg via ORAL
  Filled 2019-10-21: qty 1

## 2019-10-21 MED ORDER — METOPROLOL TARTRATE 12.5 MG HALF TABLET
12.5000 mg | ORAL_TABLET | Freq: Two times a day (BID) | ORAL | Status: DC
  Administered 2019-10-22 – 2019-10-25 (×6): 12.5 mg via ORAL
  Filled 2019-10-21 (×8): qty 1

## 2019-10-21 MED ORDER — SODIUM CHLORIDE 0.9 % IV SOLN
1.0000 g | Freq: Once | INTRAVENOUS | Status: AC
  Administered 2019-10-21: 12:00:00 1 g via INTRAVENOUS
  Filled 2019-10-21: qty 10

## 2019-10-21 MED ORDER — METOPROLOL TARTRATE 5 MG/5ML IV SOLN
5.0000 mg | Freq: Once | INTRAVENOUS | Status: DC
  Filled 2019-10-21: qty 5

## 2019-10-21 MED ORDER — FLUTICASONE PROPIONATE 50 MCG/ACT NA SUSP
1.0000 | Freq: Every day | NASAL | Status: DC | PRN
  Administered 2019-10-23 – 2019-10-25 (×2): 1 via NASAL
  Filled 2019-10-21: qty 16

## 2019-10-21 MED ORDER — VANCOMYCIN HCL 1250 MG/250ML IV SOLN
1250.0000 mg | Freq: Once | INTRAVENOUS | Status: AC
  Administered 2019-10-21: 08:00:00 1250 mg via INTRAVENOUS
  Filled 2019-10-21: qty 250

## 2019-10-21 MED ORDER — ALPRAZOLAM 0.25 MG PO TABS
0.2500 mg | ORAL_TABLET | Freq: Two times a day (BID) | ORAL | Status: DC | PRN
  Administered 2019-10-22 – 2019-10-27 (×5): 0.25 mg via ORAL
  Filled 2019-10-21 (×5): qty 1

## 2019-10-21 MED ORDER — ALBUTEROL SULFATE (2.5 MG/3ML) 0.083% IN NEBU: 2.5000 mg | INHALATION_SOLUTION | Freq: Four times a day (QID) | RESPIRATORY_TRACT | Status: DC | PRN

## 2019-10-21 MED ORDER — PILOCARPINE HCL 1 % OP SOLN
1.0000 [drp] | Freq: Two times a day (BID) | OPHTHALMIC | Status: DC
  Administered 2019-10-21 – 2019-10-31 (×21): 1 [drp] via OPHTHALMIC
  Filled 2019-10-21 (×2): qty 15

## 2019-10-21 MED ORDER — SODIUM CHLORIDE 0.9 % IV SOLN
2.0000 g | INTRAVENOUS | Status: DC
  Administered 2019-10-22 – 2019-10-24 (×3): 2 g via INTRAVENOUS
  Filled 2019-10-21 (×3): qty 20

## 2019-10-21 MED ORDER — FLUTICASONE FUROATE-VILANTEROL 200-25 MCG/INH IN AEPB
1.0000 | INHALATION_SPRAY | Freq: Every day | RESPIRATORY_TRACT | Status: DC
  Administered 2019-10-23 – 2019-10-31 (×8): 1 via RESPIRATORY_TRACT
  Filled 2019-10-21 (×2): qty 28

## 2019-10-21 MED ORDER — PHYTONADIONE 5 MG PO TABS
2.5000 mg | ORAL_TABLET | Freq: Once | ORAL | Status: AC
  Administered 2019-10-21: 2.5 mg via ORAL
  Filled 2019-10-21: qty 1

## 2019-10-21 MED ORDER — CITALOPRAM HYDROBROMIDE 20 MG PO TABS
10.0000 mg | ORAL_TABLET | Freq: Every day | ORAL | Status: DC
  Administered 2019-10-21 – 2019-10-31 (×11): 10 mg via ORAL
  Filled 2019-10-21 (×11): qty 1

## 2019-10-21 MED ORDER — SODIUM CHLORIDE 0.9 % IV SOLN
1.0000 g | Freq: Once | INTRAVENOUS | Status: AC
  Administered 2019-10-21: 06:00:00 1 g via INTRAVENOUS
  Filled 2019-10-21: qty 10

## 2019-10-21 MED ORDER — ALPRAZOLAM 0.25 MG PO TABS: 0.5000 mg | ORAL_TABLET | Freq: Two times a day (BID) | ORAL | Status: DC | PRN

## 2019-10-21 MED ORDER — METOPROLOL TARTRATE 5 MG/5ML IV SOLN: 5.0000 mg | Freq: Four times a day (QID) | INTRAVENOUS | Status: DC

## 2019-10-21 MED ORDER — FUROSEMIDE 10 MG/ML IJ SOLN
40.0000 mg | Freq: Once | INTRAMUSCULAR | Status: DC
  Filled 2019-10-21: qty 4

## 2019-10-21 MED ORDER — RESOURCE THICKENUP CLEAR PO POWD
ORAL | Status: DC | PRN
  Filled 2019-10-21: qty 125

## 2019-10-21 NOTE — Progress Notes (Signed)
ANTICOAGULATION CONSULT NOTE - Initial Consult  Pharmacy Consult for warfarin Indication: atrial fibrillation  Allergies  Allergen Reactions  . Alphagan [Brimonidine] Other (See Comments)    Burning sensation  . Apraclonidine Other (See Comments)    Pain, brow pain, tender, not able to tolerate  . Biaxin [Clarithromycin] Other (See Comments)    Unknown, per pt   . Brovana [Arformoterol] Other (See Comments)    Makes my heart race  . Budesonide Other (See Comments)    Makes my heart race  . Cefdinir Other (See Comments)    "FEEL HORRIBLE"  . Cephalexin Itching    Pt tolerated Rocephin 08/2019  . Cifenline Itching  . Clonidine Other (See Comments)    unknown  . Clonidine Derivatives Other (See Comments)    REACTION: unknown  . Dorzolamide Other (See Comments)    Redness  . Iodinated Diagnostic Agents Other (See Comments)    unknown  . Iohexol Other (See Comments)     Desc: NOTES FROM PRIOR CT STATES PRE MEDICATION PRIOR TO OMNIPAQUE ENHANCED CT   . Nsaids Other (See Comments)    Unknown  . Penicillins Itching, Swelling and Other (See Comments)    Swelling and edema of ears and rash Swelling to ears  Has patient had a PCN reaction causing immediate rash, facial/tongue/throat swelling, SOB or lightheadedness with hypotension: {no Has patient had a PCN reaction causing severe rash involving mucus membranes or skin necrosis: {no Has patient had a PCN reaction that required hospitalization no Has patient had a PCN reaction occurring within the last 10 years: no If all of the above answers are "NO", then may proceed with Cephalosporin use.  . Sulfamethoxazole Other (See Comments)    Unknown  . Sulfonamide Derivatives Itching  . Tetracycline Itching and Other (See Comments)    unknown  . Timolol Other (See Comments)    Eye red and irritated   . Vancomycin Other (See Comments)    Turn red  . Zetia [Ezetimibe] Other (See Comments)    Unknown  . Cardizem [Diltiazem Hcl]  Rash    Patient Measurements: Height: 5\' 5"  (165.1 cm) Weight: 59.4 kg (131 lb) IBW/kg (Calculated) : 57  Vital Signs: Temp: 99.1 F (37.3 C) (04/17 0242) Temp Source: Oral (04/17 0242) BP: 96/58 (04/17 1015) Pulse Rate: 102 (04/17 1015)  Labs: Recent Labs    10/21/19 0319 10/21/19 0613  HGB 8.3*  --   HCT 26.1*  --   PLT 121*  --   LABPROT 76.0*  --   INR 9.3*  --   CREATININE 1.24*  --   TROPONINIHS 4 6    Estimated Creatinine Clearance: 25.5 mL/min (A) (by C-G formula based on SCr of 1.24 mg/dL (H)).   Medical History: Past Medical History:  Diagnosis Date  . Anxiety   . Arthritis   . CAD (coronary artery disease) 2007   a. 1997 s/p PCI of diagonal.  . Constipation   . COPD/Asthma   . Diverticulosis   . Esophageal reflux   . Family history of adverse reaction to anesthesia    daughter had trouble breathing after surgery, had to be reintubated  . Glaucoma   . HTN (hypertension), benign   . Hyperlipidemia   . Malignant melanoma ([facility])    a. 09/2012 s/p resection.  . Neuropathy    lower legs  . PAF (paroxysmal atrial fibrillation) (HCC)    a. intolerant to beta blockers due to asthma and intolerant to CCB due to rash;  b. CHA2DS2VASc = 6-->chronic coumadin;  c. 03/2014 Echo: EF 60-65%, Gr 1 DD, triv AI, mildly dil RV, PASP 15mmhg.  [name] TIA (transient ischemic attack)    a. 02/2011 - chronic coumadin in setting of PAF.  [name] Urinary, incontinence, stress female    Assessment: 44 YOF presenting with SOB, on warfarin for Afib.  INR on admission supratherapeutic at 9.3, received vitamin K 2.5mg  PO 4/17.  PTA dosing: 4mg  daily  Goal of Therapy:  INR 2-3 Monitor platelets by anticoagulation protocol: Yes   Plan:  Hold warfarin tonight Daily INR, s/s bleeding  Bertis Ruddy, PharmD Clinical Pharmacist ED Pharmacist Phone # [phone] 10/21/2019 12:25 PM

## 2019-10-21 NOTE — ED Notes (Signed)
Pt lies with her eyes closed

## 2019-10-21 NOTE — ED Notes (Signed)
The pt has a lt foot swelling and a lesion on her lt heel for a ?? Length of time

## 2019-10-21 NOTE — ED Notes (Signed)
Patient placed on pure wick °

## 2019-10-21 NOTE — Progress Notes (Addendum)
Patient did not appear to tolerate attempts at diuresis with Lasix 20 mg.  Blood pressures seem to drop into the 80s.  Heart rates in low 100s.  Will give bolus of 250 mL of IV fluids as needed to maintain maps greater than 65%.   IV furosemide discontinued.  Will need to reassess need of diuresis when clinically appropriate.  Orthopedics consulted to see the patient.

## 2019-10-21 NOTE — Progress Notes (Signed)
VASCULAR LAB PRELIMINARY  PRELIMINARY  PRELIMINARY  PRELIMINARY  Right lower extremity venous duplex completed.    Preliminary report:  See CV proc for preliminary results.   Salomon Ganser, RVT 10/21/2019, 10:57 AM

## 2019-10-21 NOTE — ED Notes (Signed)
While X-RAY was setting up patients O2 sat dropped to 91% RA. Per Charge RN was told to apply 2L. Patient O2 back up to 97%.

## 2019-10-21 NOTE — H&P (Signed)
History and Physical    [name] LKG:[id number] DOB: September 09, 1923 DOA: 10/21/2019  Referring MD/NP/PA: Inda Merlin, MD PCP: Julian Hy, PA-C  Patient coming from: home   Chief Complaint: Shortness of breath  I have personally briefly reviewed patient's old medical records in [place]   HPI: [name] is a 86 y.o. female with medical history significant of HTN, HLD, persistent atrial fibrillation on Coumadin, CAD s/p stent, COPD/asthma, and anxiety presents with complaints of shortness of breath which started around 1:30 AM this morning.  Patient is resting and somewhat lethargic so much of history is obtained from her daughter present at bedside.  At baseline the patient's medications are arranged in a pillbox for her to take daily, but patient reportedly picks and chooses what she wants to take each day.  Family had plan to take the patient to be evaluated for worsening wound on her right heel.  Apparently she has been ordered an orthotic device to wear on her right foot, but it made her shoes and slippers too tight.  Patient had previously been being followed by wound care up until about a month ago.  EMS was called due to new onset of shortness of breath.  Shortness of breath symptoms were not improved after receiving nebulized breathing treatment.  EMS noted O2 saturations 89% on room air and she was placed on a nonrebreather.   ED Course: Upon admission into the emergency department patient was seen to be afebrile, pulse 10 1-1 42 in atrial fibrillation with RVR, respirations 22-31, blood pressures as low as 88/56-123/70, and O2 saturations 89% to 98% after being placed on 1.5 L of nasal cannula oxygen.  Labs significant for WBC 8.4, hemoglobin 8.3, potassium 3.6, platelets 127, BUN 26, creatinine 1.24, BNP 275.2, and INR 9.3.  X-rays of the right foot revealed signs concerning for osteomyelitis.  Patient has been given vancomycin, Rocephin, 0.5 mg  of Ativan, and metoprolol 5 mg IV.  In the ED patient was also noted to have some issues with swallowing pills.  Review of Systems  Unable to perform ROS: Mental status change  Respiratory: Positive for shortness of breath.   Cardiovascular: Positive for leg swelling.    Past Medical History:  Diagnosis Date  . Anxiety   . Arthritis   . CAD (coronary artery disease) 2007   a. 1997 s/p PCI of diagonal.  . Constipation   . COPD/Asthma   . Diverticulosis   . Esophageal reflux   . Family history of adverse reaction to anesthesia    daughter had trouble breathing after surgery, had to be reintubated  . Glaucoma   . HTN (hypertension), benign   . Hyperlipidemia   . Malignant melanoma ([place])    a. 09/2012 s/p resection.  . Neuropathy    lower legs  . PAF (paroxysmal atrial fibrillation) (HCC)    a. intolerant to beta blockers due to asthma and intolerant to CCB due to rash;  b. CHA2DS2VASc = 6-->chronic coumadin;  c. 03/2014 Echo: EF 60-65%, Gr 1 DD, triv AI, mildly dil RV, PASP 24mhg.  .Marland KitchenTIA (transient ischemic attack)    a. 02/2011 - chronic coumadin in setting of PAF.  .Marland KitchenUrinary, incontinence, stress female     Past Surgical History:  Procedure Laterality Date  . ABDOMINAL HYSTERECTOMY    . APPENDECTOMY    . BACK SURGERY     lumbar laminectomy  . CHOLECYSTECTOMY    . COLONOSCOPY    . CORONARY  ANGIOPLASTY WITH STENT PLACEMENT    . EYE SURGERY    . NASAL SINUS SURGERY    . VESICOVAGINAL FISTULA CLOSURE W/ TAH    . WEIL OSTEOTOMY Left 06/12/2016   Procedure: Left [place]ron and Aiken Left [name], Weil Osteotomy 2nd and 3rd Metatarsal;  Surgeon: Newt Minion, MD;  Location: [facility];  Service: Orthopedics;  Laterality: Left;     reports that she quit smoking about 53 years ago. Her smoking use included cigarettes. She has a 5.00 pack-year smoking history. She has never used smokeless tobacco. She reports that she does not drink alcohol or use drugs.  Allergies  Allergen  Reactions  . Alphagan [Brimonidine] Other (See Comments)    Burning sensation  . Apraclonidine Other (See Comments)    Pain, brow pain, tender, not able to tolerate  . Biaxin [Clarithromycin] Other (See Comments)    Unknown, per pt   . Brovana [Arformoterol] Other (See Comments)    Makes my heart race  . Budesonide Other (See Comments)    Makes my heart race  . Cefdinir Other (See Comments)    "FEEL HORRIBLE"  . Cephalexin Itching    Pt tolerated Rocephin 08/2019  . Cifenline Itching  . Clonidine Other (See Comments)    unknown  . Clonidine Derivatives Other (See Comments)    REACTION: unknown  . Dorzolamide Other (See Comments)    Redness  . Iodinated Diagnostic Agents Other (See Comments)    unknown  . Iohexol Other (See Comments)     Desc: NOTES FROM PRIOR CT STATES PRE MEDICATION PRIOR TO OMNIPAQUE ENHANCED CT   . Nsaids Other (See Comments)    Unknown  . Penicillins Itching, Swelling and Other (See Comments)    Swelling and edema of ears and rash Swelling to ears  Has patient had a PCN reaction causing immediate rash, facial/tongue/throat swelling, SOB or lightheadedness with hypotension: {no Has patient had a PCN reaction causing severe rash involving mucus membranes or skin necrosis: {no Has patient had a PCN reaction that required hospitalization no Has patient had a PCN reaction occurring within the last 10 years: no If all of the above answers are "NO", then may proceed with Cephalosporin use.  . Sulfamethoxazole Other (See Comments)    Unknown  . Sulfonamide Derivatives Itching  . Tetracycline Itching and Other (See Comments)    unknown  . Timolol Other (See Comments)    Eye red and irritated   . Vancomycin Other (See Comments)    Turn red  . Zetia [Ezetimibe] Other (See Comments)    Unknown  . Cardizem [Diltiazem Hcl] Rash    Family History  Problem Relation Age of Onset  . Heart disease Father   . Hemachromatosis Father   . Heart attack Father     . Colon cancer Mother   . Peripheral vascular disease Mother   . Peripheral vascular disease Sister   . Cancer Sister   . Lymphoma Sister   . Heart attack Brother   . Heart disease Brother     Prior to Admission medications   Medication Sig Start Date End Date Taking? Authorizing Provider  acetaminophen (TYLENOL) 650 MG CR tablet Take 1,300 mg by mouth every 8 (eight) hours as needed for pain.   Yes [provider]  albuterol (PROAIR HFA) 108 (90 Base) MCG/ACT inhaler Inhale 1-2 puffs into the lungs every 4 (four) hours as needed for wheezing or shortness of breath. 12/11/15  Yes Juanito Doom, MD  albuterol (PROVENTIL) (2.5 MG/3ML) 0.083% nebulizer solution Take 3 mLs (2.5 mg total) by nebulization every 6 (six) hours as needed for wheezing or shortness of breath. 07/29/18  Yes Juanito Doom, MD  ALPRAZolam Duanne Moron) 0.5 MG tablet Take 0.5 mg by mouth 2 (two) times daily as needed for anxiety.   Yes [provider]  amLODipine (NORVASC) 10 MG tablet TAKE 1 TABLET BY MOUTH EVERY DAY Patient taking differently: Take 10 mg by mouth daily.  07/13/19  Yes Turner, Eber Hong, MD  cephALEXin (KEFLEX) 500 MG capsule Take 500 mg by mouth 4 (four) times daily. For 4 days   Yes [provider]  cetirizine (ZYRTEC) 10 MG tablet Take 10 mg by mouth daily as needed for allergies.   Yes [provider]  citalopram (CELEXA) 10 MG tablet Take 10 mg by mouth daily.   Yes [provider]  clotrimazole-betamethasone (LOTRISONE) cream Apply 1 application topically 2 (two) times daily as needed for skin breakdown. 07/13/19  Yes [provider]  fluticasone (FLONASE) 50 MCG/ACT nasal spray USE 1 SPRAY IN EACH NOSTRIL ONCE A DAY AS NEEDED FOR ALLERGIES OR RHINITIS Patient taking differently: Place 1 spray into both nostrils daily as needed for allergies or rhinitis.  04/25/19  Yes Juanito Doom, MD  fluticasone furoate-vilanterol (BREO ELLIPTA) 200-25  MCG/INH AEPB Inhale 1 puff into the lungs daily. 01/27/17  Yes Juanito Doom, MD  furosemide (LASIX) 40 MG tablet Take 1 tablet (40 mg total) by mouth daily as needed for fluid. 05/13/18  Yes Turner, Eber Hong, MD  latanoprost (XALATAN) 0.005 % ophthalmic solution Place 1 drop into both eyes at bedtime.   Yes [provider]  lisinopril (ZESTRIL) 40 MG tablet TAKE 1 TABLET BY MOUTH EVERY DAY Patient taking differently: Take 40 mg by mouth daily.  05/30/19  Yes Turner, Eber Hong, MD  loperamide (IMODIUM A-D) 2 MG tablet Take 2 mg by mouth daily as needed for diarrhea or loose stools.   Yes [provider]  metoprolol tartrate (LOPRESSOR) 25 MG tablet Take 0.5 tablets (12.5 mg total) by mouth 2 (two) times daily. 08/09/19 10/21/19 Yes Aslam, Sadia, MD  montelukast (SINGULAIR) 10 MG tablet TAKE 1 TABLET BY MOUTH EVERYDAY AT BEDTIME Patient taking differently: Take 10 mg by mouth at bedtime.  05/29/19  Yes Martyn Ehrich, NP  Multiple Vitamins-Minerals (MULTIVITAMIN & MINERAL PO) Take 1 tablet by mouth daily.   Yes [provider]  pilocarpine (PILOCAR) 1 % ophthalmic solution Place 1 drop into both eyes 2 (two) times daily.  12/30/17  Yes [provider]  warfarin (COUMADIN) 2 MG tablet Take 4 mg by mouth daily at 6 PM.   Yes [provider]  warfarin (COUMADIN) 2.5 MG tablet Take 1-1.5 tablets (2.5-3.75 mg total) by mouth daily. Take 7.36m (3 tablets) for the next 3 days (today, Saturday and Sunday). On Monday check INR and resume your normal dosing as noted below.  Take 3.75 mg (1.5 tablets) on Sun, Tues, Weds, Thurs, Sat & Take 2.5 mg (1 tablet) on Mon & Fri Patient not taking: Reported on 10/21/2019 04/23/17   EAlma Friendly MD    Physical Exam:  Constitutional: Elderly female who appears to be resting comfortably Vitals:   10/21/19 0545 10/21/19 0724 10/21/19 0801 10/21/19 0848  BP: (!) 94/56 (!) 90/52 (!) 88/56 (!) 100/59  Pulse:  (!) 101  (!) 106 (!) 116  Resp: (!) 30 (!) 27 (!) 27 (!) 22  Temp:      TempSrc:      SpO2:  96% 96% 96%  Weight:      Height:       Eyes: PERRL, lids and conjunctivae normal ENMT: Mucous membranes are moist. Posterior pharynx clear of any exudate or lesions.  Neck: normal, supple, no masses, no thyromegaly.  JVD appreciated Respiratory: Positive crackles heard and both lung fields.  Patient currently on 1.5 L of oxygen with O2 saturations maintained. Cardiovascular:  Irregular irregular.  +1 pitting edema of the lower extremities. Abdomen: no tenderness, no masses palpated. No hepatosplenomegaly. Bowel sounds positive.  Musculoskeletal: no clubbing / cyanosis. No joint deformity upper and lower extremities. Good ROM, no contractures. Normal muscle tone.  Skin: Ulceration of the right heel with wound seen below   Neurologic: CN 2-12 grossly intact.  Able to move all extremities.   Psychiatric: Lethargic but easily arousable.    Labs on Admission: I have personally reviewed following labs and imaging studies  CBC: Recent Labs  Lab 10/21/19 0319  WBC 8.4  NEUTROABS 8.0*  HGB 8.3*  HCT 26.1*  MCV 100.4*  PLT 226*   Basic Metabolic Panel: Recent Labs  Lab 10/21/19 0319  NA 134*  K 3.6  CL 98  CO2 24  GLUCOSE 143*  BUN 26*  CREATININE 1.24*  CALCIUM 8.5*   GFR: Estimated Creatinine Clearance: 25.5 mL/min (A) (by C-G formula based on SCr of 1.24 mg/dL (H)). Liver Function Tests: Recent Labs  Lab 10/21/19 0319  AST 24  ALT 16  ALKPHOS 62  BILITOT 1.0  PROT 6.7  ALBUMIN 3.2*   No results for input(s): LIPASE, AMYLASE in the last 168 hours. No results for input(s): AMMONIA in the last 168 hours. Coagulation Profile: Recent Labs  Lab 10/21/19 0319  INR 9.3*   Cardiac Enzymes: No results for input(s): CKTOTAL, CKMB, CKMBINDEX, TROPONINI in the last 168 hours. BNP (last 3 results) No results for input(s): PROBNP in the last 8760 hours. HbA1C: No results for  input(s): HGBA1C in the last 72 hours. CBG: No results for input(s): GLUCAP in the last 168 hours. Lipid Profile: No results for input(s): CHOL, HDL, LDLCALC, TRIG, CHOLHDL, LDLDIRECT in the last 72 hours. Thyroid Function Tests: No results for input(s): TSH, T4TOTAL, FREET4, T3FREE, THYROIDAB in the last 72 hours. Anemia Panel: No results for input(s): VITAMINB12, FOLATE, FERRITIN, TIBC, IRON, RETICCTPCT in the last 72 hours. Urine analysis:    Component Value Date/Time   COLORURINE YELLOW 04/05/2018 1344   APPEARANCEUR CLEAR 04/05/2018 1344   LABSPEC 1.009 04/05/2018 1344   PHURINE 6.0 04/05/2018 1344   GLUCOSEU NEGATIVE 04/05/2018 1344   HGBUR NEGATIVE 04/05/2018 1344   BILIRUBINUR NEGATIVE 04/05/2018 1344   KETONESUR NEGATIVE 04/05/2018 1344   PROTEINUR NEGATIVE 04/05/2018 1344   UROBILINOGEN 0.2 03/05/2015 1600   NITRITE NEGATIVE 04/05/2018 1344   LEUKOCYTESUR NEGATIVE 04/05/2018 1344   Sepsis Labs: No results found for this or any previous visit (from the past 240 hour(s)).   Radiological Exams on Admission: CT Chest Wo Contrast  Addendum Date: 10/21/2019   ADDENDUM REPORT: 10/21/2019 06:14 ADDENDUM: Ascending thoracic aortic aneurysm. Recommend semi-annual imaging followup by CTA or MRA and referral to cardiothoracic surgery if not already obtained. This recommendation follows 2010 ACCF/AHA/AATS/ACR/ASA/SCA/SCAI/SIR/STS/SVM Guidelines for the Diagnosis and Management of Patients With Thoracic Aortic Disease. Circulation. 2010; 121: J335-K562. Aortic aneurysm NOS (ICD10-I71.9) Electronically Signed   By: Ulyses Jarred M.D.   On: 10/21/2019 06:14   Result Date: 10/21/2019  CLINICAL DATA:  Pleural effusion EXAM: CT CHEST WITHOUT CONTRAST TECHNIQUE: Multidetector CT imaging of the chest was performed following the standard protocol without IV contrast. COMPARISON:  Chest radiograph 10/21/2019 FINDINGS: Cardiovascular: Calcific aortic atherosclerosis. Ascending thoracic aorta  measures 4.5 cm. There is atherosclerotic calcification of the coronary arteries. Moderate cardiomegaly. Mediastinum/Nodes: No enlarged mediastinal or axillary lymph nodes. Thyroid gland, trachea, and esophagus demonstrate no significant findings. Lungs/Pleura: Intermediate sized pleural effusions. Left basilar atelectasis. Airways patent. Upper Abdomen: Status post cholecystectomy. Musculoskeletal: IMPRESSION: 1. Intermediate sized bilateral pleural effusions with left basilar atelectasis. 2. Coronary artery and. Ascending thoracic aortic aneurysm, measuring up to 4.5 cm. 3. Aortic Atherosclerosis (ICD10-I70.0). Electronically Signed: By: Ulyses Jarred M.D. On: 10/21/2019 04:36   DG Chest Portable 1 View  Result Date: 10/21/2019 CLINICAL DATA:  Shortness of breath EXAM: PORTABLE CHEST 1 VIEW COMPARISON:  08/08/2019 FINDINGS: Small left pleural effusion, slightly increased in size. Left basilar atelectasis. Cardiomediastinal contours are normal. There is calcific aortic atherosclerosis. Right lung is clear. IMPRESSION: Slight increase in size of small left pleural effusion with associated atelectasis. Electronically Signed   By: Ulyses Jarred M.D.   On: 10/21/2019 03:04   DG Foot Complete Right  Result Date: 10/21/2019 CLINICAL DATA:  Evaluate for osteomyelitis EXAM: RIGHT FOOT COMPLETE - 3+ VIEW COMPARISON:  None. FINDINGS: Osteopenia. Hallux valgus deformity with secondary osteoarthritis of the first metatarsophalangeal joint. Suboptimal patient positioning. Diffuse soft tissue swelling. Soft tissue irregularity about the heel with suspicion of cortical irregularity and osteolysis within the subjacent calcaneus. Small Achilles and calcaneal spurs. IMPRESSION: 1. Soft tissue irregularity about the heel with underlying cortical irregularity and possible osteolysis. Findings are suspicious for osteomyelitis. 2. Hallux valgus deformity with secondary osteoarthritis. Electronically Signed   By: Abigail Miyamoto  M.D.   On: 10/21/2019 06:00    EKG: Independently reviewed.  A. fib with RVR rate 138 bpm  Assessment/Plan Acute osteomyelitis of right foot: Patient presents with complaints of a wound of the right heel foot.  X-ray  concerning for possible osteomyelitis.  Doppler ultrasound negative for DVT.-Admit to a progressive bed -Follow-up blood cultures  -Check ESR 85 , CRP 9.7, lactic acid -Continue empiric antibiotics Rocephin and vancomycin -Check MRI of the right foot -Will need to formally consult orthopedics if MRI positive for signs of osteomyelitis.  Bilateral pleural effusions, diastolic congestive heart failure: Acute.   Last EF noted to be 60 to 65% in 2016. Likely secondary to rapid A. fib and/or aspect of heart failure with BNP elevated 275.2. -Strict intake and output -Daily weight -Elevate lower extremities -Check echocardiogram -Lasix 20 g IV twice daily as tolerated -Reassess need of continued diuresis in a.m.  -Consider discussing with cardiology if needed  Persistent A. fib with RVR: Acute on chronic.  Patient presented with heart rates elevated into the 130s.  She is on Coumadin.  Followed by Dr. Golden Hurter cardiology in outpatient setting.  Patient had been given 5 mg of metoprolol IV with improvement in heart rate.  Question if patient has been intermittently not taking her metoprolol as the reason for her elevated heart rates at this time. -Goal potassium 4 and magnesium 2 -Restart home metoprolol 12.5 mg twice daily   Supratherapeutic INR: Acute on chronic.  INR noted to be 9.3.  Patient had been given 2.5 mg of vitamin K. -Continue to monitor INR daily -Hold Coumadin -Pharmacy to adjust Coumadin dosing  Macrocytic anemia: Acute.  Hemoglobin down to 8.3 g/dL, but previous baseline appears  to be from 10-11 based off labs from February of this year.  Patient noted to have supratherapeutic INR. -Check stool guaiac -Check vitamin B12 and folate in a.m.  Acute kidney  injury: Creatinine elevated up to 1.24 with BUN 26.  Baseline creatinine previously noted to be 0.94.  Would suspect symptoms likely secondary to hypoperfusion from rapid A. fib. -Monitor kidney function with diuresis -Limiting nephrotoxic agents  Transient hypotension: Patient's initial systolic blood pressures in 80s with heart rates were elevated into the 140s..  Home blood pressure medications include amlodipine 10 mg daily, lisinopril 40 mg daily, furosemide 40 mg daily as needed, and metoprolol 12.5 mg twice daily.  Suspect blood pressure improvement with better rate control. -Hold amlodipine and lisinopril due to low blood pressures and AKI -Continue metoprolol  SIRS: Acute.  Patient was noted to be tachycardic and tachypneic on admission.  Suspect this could be reactive in nature or be associated with underlying infection. -Follow-up lactic acid   Dysphagia: Patient reportedly has some issues with swallowing pills in the ED.  Family note no prior history. -Speech therapy consulted to eval  AAA: CT of the chest noted a 4.5 cm ascending thoracic aneurysm.   -Semiannual CTA or MRI recommended  COPD: Patient without signs of wheezing noted on physical exam. -Albuterol nebs as needed  Anxiety: Family notes patient takes 0.25 mg Ativan as needed and rarely uses this medication.  However on admission patient had been given 0.5 mg in the ED.  Patient lethargic but arousable. -Ativan decreased to 0.25 mg as needed for anxiety -Continue Celexa  DNR present on admission  DVT prophylaxis: Supratherapeutic INR Code Status: DNR Family Communication: Daughter updated at bedside Disposition Plan: Possible discharge home in 2 to 3 days Consults called: None Admission status: Inpatient  Norval Morton MD [facility] Pager [phone]   If 7PM-7AM, please contact night-coverage [email or web address] Password [facility]  10/21/2019, 9:04 AM

## 2019-10-21 NOTE — Consult Note (Signed)
Reason for Consult: Right posterior heel ulceration with underlying calcaneal fracture and osteomyelitis Referring Physician: Fuller Plan, MD  [name] is an 86 y.o. female.  HPI: Patient was brought into the emergency department by her family today for shortness of breath and they also noted a posterior heel ulceration that is been present for weeks.  She had a smaller ulceration there previously and was seen by podiatry.  Wound care was coming out of the house and it healed up.  However she had orthotics made recently which rubbed in the back of her heel and the ulcer returned.  They note a foul odor.  They have been placing dressings.  X-rays and MRI scan in the emergency department demonstrated a fracture of the calcaneal tuberosity with some bony destruction and concern for osteomyelitis.  Foul odor present as well.  Blood cultures were drawn in the emergency department and ESR and CRP were elevated.  She is being admitted to the hospitalist team for work-up for her shortness of breath and heart failure and orthopedics was consulted.  On my consultation family member state they would be amenable to surgery if needed.  She has been placed on antibiotics.  Past Medical History:  Diagnosis Date  . Anxiety   . Arthritis   . CAD (coronary artery disease) 2007   a. 1997 s/p PCI of diagonal.  . Constipation   . COPD/Asthma   . Diverticulosis   . Esophageal reflux   . Family history of adverse reaction to anesthesia    daughter had trouble breathing after surgery, had to be reintubated  . Glaucoma   . HTN (hypertension), benign   . Hyperlipidemia   . Malignant melanoma ([facility])    a. 09/2012 s/p resection.  . Neuropathy    lower legs  . PAF (paroxysmal atrial fibrillation) (HCC)    a. intolerant to beta blockers due to asthma and intolerant to CCB due to rash;  b. CHA2DS2VASc = 6-->chronic coumadin;  c. 03/2014 Echo: EF 60-65%, Gr 1 DD, triv AI, mildly dil RV, PASP 19mhg.  .Marland KitchenTIA  (transient ischemic attack)    a. 02/2011 - chronic coumadin in setting of PAF.  .Marland KitchenUrinary, incontinence, stress female     Past Surgical History:  Procedure Laterality Date  . ABDOMINAL HYSTERECTOMY    . APPENDECTOMY    . BACK SURGERY     lumbar laminectomy  . CHOLECYSTECTOMY    . COLONOSCOPY    . CORONARY ANGIOPLASTY WITH STENT PLACEMENT    . EYE SURGERY    . NASAL SINUS SURGERY    . VESICOVAGINAL FISTULA CLOSURE W/ TAH    . WEIL OSTEOTOMY Left 06/12/2016   Procedure: Left Chevron and Aiken Left GShauna Hugh Weil Osteotomy 2nd and 3rd Metatarsal;  Surgeon: MNewt Minion MD;  Location: [facility]  Service: Orthopedics;  Laterality: Left;    Family History  Problem Relation Age of Onset  . Heart disease Father   . Hemachromatosis Father   . Heart attack Father   . Colon cancer Mother   . Peripheral vascular disease Mother   . Peripheral vascular disease Sister   . Cancer Sister   . Lymphoma Sister   . Heart attack Brother   . Heart disease Brother     Social History:  reports that she quit smoking about 53 years ago. Her smoking use included cigarettes. She has a 5.00 pack-year smoking history. She has never used smokeless tobacco. She reports that she does not  drink alcohol or use drugs.  Allergies:  Allergies  Allergen Reactions  . Alphagan [Brimonidine] Other (See Comments)    Burning sensation  . Apraclonidine Other (See Comments)    Pain, brow pain, tender, not able to tolerate  . Biaxin [Clarithromycin] Other (See Comments)    Unknown, per pt   . Brovana [Arformoterol] Other (See Comments)    Makes my heart race  . Budesonide Other (See Comments)    Makes my heart race  . Cefdinir Other (See Comments)    "FEEL HORRIBLE"  . Cephalexin Itching    Pt tolerated Rocephin 08/2019  . Cifenline Itching  . Clonidine Other (See Comments)    unknown  . Clonidine Derivatives Other (See Comments)    REACTION: unknown  . Dorzolamide Other (See Comments)    Redness  .  Iodinated Diagnostic Agents Other (See Comments)    unknown  . Iohexol Other (See Comments)     Desc: NOTES FROM PRIOR CT STATES PRE MEDICATION PRIOR TO OMNIPAQUE ENHANCED CT   . Nsaids Other (See Comments)    Unknown  . Penicillins Itching, Swelling and Other (See Comments)    Swelling and edema of ears and rash Swelling to ears  Has patient had a PCN reaction causing immediate rash, facial/tongue/throat swelling, SOB or lightheadedness with hypotension: {no Has patient had a PCN reaction causing severe rash involving mucus membranes or skin necrosis: {no Has patient had a PCN reaction that required hospitalization no Has patient had a PCN reaction occurring within the last 10 years: no If all of the above answers are "NO", then may proceed with Cephalosporin use.  . Sulfamethoxazole Other (See Comments)    Unknown  . Sulfonamide Derivatives Itching  . Tetracycline Itching and Other (See Comments)    unknown  . Timolol Other (See Comments)    Eye red and irritated   . Vancomycin Other (See Comments)    Turn red  . Zetia [Ezetimibe] Other (See Comments)    Unknown  . Cardizem [Diltiazem Hcl] Rash    Medications: I have reviewed the patient's current medications.  Results for orders placed or performed during the hospital encounter of 10/21/19 (from the past 48 hour(s))  CBC with Differential     Status: Abnormal   Collection Time: 10/21/19  3:19 AM  Result Value Ref Range   WBC 8.4 4.0 - 10.5 K/uL   RBC 2.60 (L) 3.87 - 5.11 MIL/uL   Hemoglobin 8.3 (L) 12.0 - 15.0 g/dL   HCT 26.1 (L) 36.0 - 46.0 %   MCV 100.4 (H) 80.0 - 100.0 fL   MCH 31.9 26.0 - 34.0 pg   MCHC 31.8 30.0 - 36.0 g/dL   RDW 19.4 (H) 11.5 - 15.5 %   Platelets 121 (L) 150 - 400 K/uL   nRBC 0.0 0.0 - 0.2 %   Neutrophils Relative % 97 %   Neutro Abs 8.0 (H) 1.7 - 7.7 K/uL   Lymphocytes Relative 2 %   Lymphs Abs 0.2 (L) 0.7 - 4.0 K/uL   Monocytes Relative 1 %   Monocytes Absolute 0.1 0.1 - 1.0 K/uL    Eosinophils Relative 0 %   Eosinophils Absolute 0.0 0.0 - 0.5 K/uL   Basophils Relative 0 %   Basophils Absolute 0.0 0.0 - 0.1 K/uL   Immature Granulocytes 0 %   Abs Immature Granulocytes 0.03 0.00 - 0.07 K/uL    Comment: Performed at [facility], [address], [place], [place] [address]  Comprehensive metabolic panel     Status: Abnormal   Collection Time: 10/21/19  3:19 AM  Result Value Ref Range   Sodium 134 (L) 135 - 145 mmol/L   Potassium 3.6 3.5 - 5.1 mmol/L   Chloride 98 98 - 111 mmol/L   CO2 24 22 - 32 mmol/L   Glucose, Bld 143 (H) 70 - 99 mg/dL    Comment: Glucose reference range applies only to samples taken after fasting for at least 8 hours.   BUN 26 (H) 8 - 23 mg/dL   Creatinine, Ser 1.24 (H) 0.44 - 1.00 mg/dL   Calcium 8.5 (L) 8.9 - 10.3 mg/dL   Total Protein 6.7 6.5 - 8.1 g/dL   Albumin 3.2 (L) 3.5 - 5.0 g/dL   AST 24 15 - 41 U/L   ALT 16 0 - 44 U/L   Alkaline Phosphatase 62 38 - 126 U/L   Total Bilirubin 1.0 0.3 - 1.2 mg/dL   GFR calc non Af Amer 37 (L) >60 mL/min   GFR calc Af Amer 43 (L) >60 mL/min   Anion gap 12 5 - 15    Comment: Performed at [facility] [address], [place], [place] [address]  Protime-INR     Status: Abnormal   Collection Time: 10/21/19  3:19 AM  Result Value Ref Range   Prothrombin Time 76.0 (H) 11.4 - 15.2 seconds    Comment: CRITICAL RESULT CALLED TO, READ BACK BY AND VERIFIED WITH: REPEATED TO VERIFY CRIS CHRISCO RN _0  10/21/2019 GOLDSTEIN,M    INR 9.3 (HH) 0.8 - 1.2    Comment: REPEATED TO VERIFY CRITICAL RESULT CALLED TO, READ BACK BY AND VERIFIED WITH: chris c RN _1  10/21/2019 GOLDSTEIN,M (NOTE) INR goal varies based on device and disease states. Performed at [facility], [place] [address] [place], [place] [address]   Brain natriuretic peptide     Status: Abnormal   Collection Time: 10/21/19  3:19 AM  Result Value Ref Range   B Natriuretic Peptide 275.2 (H) 0.0 - 100.0 pg/mL    Comment:  Performed at [facility] [address], [place], [place] [address]  Troponin I (High Sensitivity)     Status: None   Collection Time: 10/21/19  3:19 AM  Result Value Ref Range   Troponin I (High Sensitivity) 4 <18 ng/L    Comment: (NOTE) Elevated high sensitivity troponin I (hsTnI) values and significant  changes across serial measurements may suggest ACS but many other  chronic and acute conditions are known to elevate hsTnI results.  Refer to the "Links" section for chest pain algorithms and additional  guidance. Performed at [facility], [place] [address], [place], [place] [address]   Troponin I (High Sensitivity)     Status: None   Collection Time: 10/21/19  6:13 AM  Result Value Ref Range   Troponin I (High Sensitivity) 6 <18 ng/L    Comment: (NOTE) Elevated high sensitivity troponin I (hsTnI) values and significant  changes across serial measurements may suggest ACS but many other  chronic and acute conditions are known to elevate hsTnI results.  Refer to the "Links" section for chest pain algorithms and additional  guidance. Performed at [facility], [place] [address] [place],  [address]   Blood culture (routine x 2)     Status: None (Preliminary result)   Collection Time: 10/21/19  6:13 AM   Specimen: BLOOD  Result Value Ref Range   Specimen  Description BLOOD RIGHT ANTECUBITAL    Special Requests      BOTTLES DRAWN AEROBIC AND ANAEROBIC Blood Culture results may not be optimal due to an excessive volume of blood received in culture bottles   Culture      NO GROWTH < 12 HOURS Performed at [facility] [address], [place], [place] [address]    Report Status PENDING   Blood culture (routine x 2)     Status: None (Preliminary result)   Collection Time: 10/21/19  6:13 AM   Specimen: BLOOD RIGHT WRIST  Result Value Ref Range   Specimen Description BLOOD RIGHT WRIST    Special Requests      BOTTLES DRAWN AEROBIC AND ANAEROBIC Blood  Culture adequate volume   Culture      NO GROWTH < 12 HOURS Performed at [facility], [place] [address], [place], [place] [address]    Report Status PENDING   SARS CORONAVIRUS 2 (TAT 6-24 HRS) Nasopharyngeal Nasopharyngeal Swab     Status: None   Collection Time: 10/21/19  7:30 AM   Specimen: Nasopharyngeal Swab  Result Value Ref Range   SARS Coronavirus 2 NEGATIVE NEGATIVE    Comment: (NOTE) SARS-CoV-2 target nucleic acids are NOT DETECTED. The SARS-CoV-2 RNA is generally detectable in upper and lower respiratory specimens during the acute phase of infection. Negative results do not preclude SARS-CoV-2 infection, do not rule out co-infections with other pathogens, and should not be used as the sole basis for treatment or other patient management decisions. Negative results must be combined with clinical observations, patient history, and epidemiological information. The expected result is Negative. Fact Sheet for Patients: SugarRoll.be Fact Sheet for Healthcare Providers: https://www.woods-mathews.com/ This test is not yet approved or cleared by the [place] FDA and  has been authorized for detection and/or diagnosis of SARS-CoV-2 by FDA under an Emergency Use Authorization (EUA). This EUA will remain  in effect (meaning this test can be used) for the duration of the COVID-19 declaration under Section 56 4(b)(1) of the Act, 21 U.S.C. section 360bbb-3(b)(1), unless the authorization is terminated or revoked sooner. Performed at [facility], [place] [address] [place], [place] [address]   Sedimentation rate     Status: Abnormal   Collection Time: 10/21/19  9:46 AM  Result Value Ref Range   Sed Rate 85 (H) 0 - 22 mm/hr    Comment: Performed at [facility] [address], [place], [place] [address]  C-reactive protein     Status: Abnormal   Collection Time: 10/21/19  9:46 AM  Result Value Ref Range   CRP 9.2 (H) <1.0  mg/dL    Comment: Performed at [facility] [address], [place], [place] [address]  Lactic acid, plasma     Status: None   Collection Time: 10/21/19  9:47 AM  Result Value Ref Range   Lactic Acid, Venous 0.9 0.5 - 1.9 mmol/L    Comment: Performed at [facility] [address] [place], [place] [address]    CT Chest Wo Contrast  Addendum Date: 10/21/2019   ADDENDUM REPORT: 10/21/2019 06:14 ADDENDUM: Ascending thoracic aortic aneurysm. Recommend semi-annual imaging followup by CTA or MRA and referral to cardiothoracic surgery if not already obtained. This recommendation follows 2010 ACCF/AHA/AATS/ACR/ASA/SCA/SCAI/SIR/STS/SVM Guidelines for the Diagnosis and Management of Patients With Thoracic Aortic Disease. Circulation. 2010; 121: U023-X435. Aortic aneurysm NOS (ICD10-I71.9) Electronically Signed   By: Cletus Gash.D.  On: 10/21/2019 06:14   Result Date: 10/21/2019 CLINICAL DATA:  Pleural effusion EXAM: CT CHEST WITHOUT CONTRAST TECHNIQUE: Multidetector CT imaging of the chest was performed following the standard protocol without IV contrast. COMPARISON:  Chest radiograph 10/21/2019 FINDINGS: Cardiovascular: Calcific aortic atherosclerosis. Ascending thoracic aorta measures 4.5 cm. There is atherosclerotic calcification of the coronary arteries. Moderate cardiomegaly. Mediastinum/Nodes: No enlarged mediastinal or axillary lymph nodes. Thyroid gland, trachea, and esophagus demonstrate no significant findings. Lungs/Pleura: Intermediate sized pleural effusions. Left basilar atelectasis. Airways patent. Upper Abdomen: Status post cholecystectomy. Musculoskeletal: IMPRESSION: 1. Intermediate sized bilateral pleural effusions with left basilar atelectasis. 2. Coronary artery and. Ascending thoracic aortic aneurysm, measuring up to 4.5 cm. 3. Aortic Atherosclerosis (ICD10-I70.0). Electronically Signed: By: Ulyses Jarred M.D. On: 10/21/2019 04:36   MR HEEL RIGHT WO CONTRAST  Result  Date: 10/21/2019 CLINICAL DATA:  Possible calcaneal osteomyelitis. EXAM: MR OF THE RIGHT HEEL WITHOUT CONTRAST TECHNIQUE: Multiplanar, multisequence MR imaging of the ankle was performed. No intravenous contrast was administered. COMPARISON:  None. FINDINGS: Despite efforts by the technologist and patient, motion artifact is present on today's exam and could not be eliminated. This reduces exam sensitivity and specificity. TENDONS Peroneal: Mild common peroneus tenosynovitis. Posteromedial: Unremarkable Anterior: Unremarkable Achilles: Unremarkable Plantar Fascia: Mild thickening of the medial band of the plantar fascia with adjacent edema signal along the superficial margin of the plantar fascia. This may well reflect plantar fasciitis. LIGAMENTS Lateral: Unremarkable Medial: Unremarkable CARTILAGE Ankle Joint: Small posterior joint effusion. Subtalar Joints/Sinus Tarsi: Unremarkable Bones: There is a fracture of the calcaneus extending from the posterior calcaneal body to the posteroinferior margin of the heel as shown on axial images 14-24 of series 6. Along the lower margin of this fracture, there is widening of the fracture plane with accentuated edema, as well as a track of speckled gas densities extending from the fracture plane itself in the subcutaneous tissues towards the posteromedial cutaneous surface of the ankle is shown on images 21 through 25 of series 6. The appearance is highly suspicious for osteomyelitis complicating the posterior ankle fracture, and it is possible that osteomyelitis predisposed to fracture due to cortical weakening. I do not observe a drainable abscess. Other: Diffuse subcutaneous edema along the the visualized ankle and foot. Cellulitis not excluded. This is more confluent along the posteromedial heel and associated with probable skin ulceration in this vicinity. IMPRESSION: 1. Fracture of the calcaneus extending from the posterior-superior calcaneal body to the posteroinferior  margin of the heel. There is widening of the fracture plane with accentuated edema signal along the lower margin of this fracture, and a track of speckled gas densities extending from the fracture plane itself towards the posteromedial cutaneous surface of the ankle. The appearance is highly suspicious for osteomyelitis, and it is possible that osteomyelitis predisposed to the fracture due to cortical weakening. 2. Diffuse subcutaneous edema along the visualized ankle and foot. Cellulitis not excluded. 3. Mild common peroneus tenosynovitis. 4. Mild thickening of the medial band of the plantar fascia with adjacent edema signal, may reflect plantar fasciitis. 5. Small posterior joint effusion at the tibiotalar joint. 6. Despite efforts by the technologist and patient, motion artifact is present on today's exam and could not be eliminated. This reduces exam sensitivity and specificity. Electronically Signed   By: Van Clines M.D.   On: 10/21/2019 13:48   DG Chest Portable 1 View  Result Date: 10/21/2019 CLINICAL DATA:  Shortness of breath EXAM: PORTABLE CHEST 1 VIEW COMPARISON:  08/08/2019 FINDINGS: Small left  pleural effusion, slightly increased in size. Left basilar atelectasis. Cardiomediastinal contours are normal. There is calcific aortic atherosclerosis. Right lung is clear. IMPRESSION: Slight increase in size of small left pleural effusion with associated atelectasis. Electronically Signed   By: Ulyses Jarred M.D.   On: 10/21/2019 03:04   DG Foot Complete Right  Result Date: 10/21/2019 CLINICAL DATA:  Evaluate for osteomyelitis EXAM: RIGHT FOOT COMPLETE - 3+ VIEW COMPARISON:  None. FINDINGS: Osteopenia. Hallux valgus deformity with secondary osteoarthritis of the first metatarsophalangeal joint. Suboptimal patient positioning. Diffuse soft tissue swelling. Soft tissue irregularity about the heel with suspicion of cortical irregularity and osteolysis within the subjacent calcaneus. Small Achilles  and calcaneal spurs. IMPRESSION: 1. Soft tissue irregularity about the heel with underlying cortical irregularity and possible osteolysis. Findings are suspicious for osteomyelitis. 2. Hallux valgus deformity with secondary osteoarthritis. Electronically Signed   By: Abigail Miyamoto M.D.   On: 10/21/2019 06:00   VAS [place] LOWER EXTREMITY VENOUS (DVT) (ONLY [facility] & [facility])  Result Date: 10/21/2019  Lower Venous DVTStudy Indications: Swelling, and ulceration.  Limitations: Clothing, confusion, and movement. Comparison Study: Prior study from 09/28/14 is available for comparison Performing Technologist: Sharion Dove RVS  Examination Guidelines: A complete evaluation includes B-mode imaging, spectral Doppler, color Doppler, and power Doppler as needed of all accessible portions of each vessel. Bilateral testing is considered an integral part of a complete examination. Limited examinations for reoccurring indications may be performed as noted. The reflux portion of the exam is performed with the patient in reverse Trendelenburg.  +---------+---------------+---------+-----------+----------+--------------+ RIGHT    CompressibilityPhasicitySpontaneityPropertiesThrombus Aging +---------+---------------+---------+-----------+----------+--------------+ CFV      Full           Yes      Yes                                 +---------+---------------+---------+-----------+----------+--------------+ SFJ      Full                                                        +---------+---------------+---------+-----------+----------+--------------+ FV Prox  Full                                                        +---------+---------------+---------+-----------+----------+--------------+ FV Mid   Full                                                        +---------+---------------+---------+-----------+----------+--------------+ FV DistalFull                                                         +---------+---------------+---------+-----------+----------+--------------+ PFV      Full                                                        +---------+---------------+---------+-----------+----------+--------------+  POP      Full           Yes      Yes                                 +---------+---------------+---------+-----------+----------+--------------+ PTV      Full                                                        +---------+---------------+---------+-----------+----------+--------------+ PERO     Full                                                        +---------+---------------+---------+-----------+----------+--------------+   Left Technical Findings: Left leg not evaluated.   Summary: RIGHT: - Findings appear essentially unchanged compared to previous examination. - There is no evidence of deep vein thrombosis in the lower extremity.   *See table(s) above for measurements and observations. Electronically signed by Monica Martinez MD on 10/21/2019 at 3:11:05 PM.    Final     Review of Systems  Constitutional: Positive for fatigue.  HENT: Negative.   Eyes: Negative.   Respiratory: Positive for shortness of breath.   Cardiovascular: Positive for leg swelling.  Gastrointestinal: Negative.   Endocrine: Negative.   Genitourinary: Negative.   Musculoskeletal:       Right heel chronic ulcer with foul odor and swelling.  Skin:       Right heel ulceration  Neurological: Positive for weakness.  Psychiatric/Behavioral: Negative.    Blood pressure (!) 88/46, pulse (!) 105, temperature 99.1 F (37.3 C), temperature source Oral, resp. rate (!) 24, height _0  (1.651 m), weight 59.4 kg, SpO2 99 %. Physical Exam  Constitutional: She appears well-developed. She appears lethargic.  HENT:  Head: Normocephalic.  Eyes: Conjunctivae are normal.  Cardiovascular:  Tachycardia, low blood pressure  Respiratory: Effort normal.  GI: Soft.  Musculoskeletal:      Cervical back: Neck supple.     Comments: Right lower extremity with some swelling.  She has a 2 x 2 centimeter area of necrotic skin on the posterior aspect of her calcaneus.  There is foul odor drainage.  She has swelling about the heel and foot.  There are some erythema about the skin.  Neurological: She appears lethargic.  Skin: Lesion noted.  Psychiatric: Her affect is blunt.    Assessment/Plan: Patient has a chronic ulceration on the posterior aspect of her right heel with underlying osteomyelitis and calcaneal fracture.  Fracture likely due to underlying bony destruction from osteomyelitis.  Blood cultures have been drawn and are pending.  At this point we will await response to antibiotics prior to any surgical intervention.  If she does not fully respond to surgery or becomes more acutely ill with concern for sepsis patient would need at a minimum a partial calcanectomy versus below the knee amputation for her infection.  I am hopeful that she will respond to the antibiotics and not need surgery.  This was discussed with the family at the bedside who was amenable to surgery if needed.  This is despite her DNR order.  Orthopedics will follow laboratory values and cultures and monitor the wound.  Would recommend wound care evaluation.  It is also imperative that the patient floats her right heel as resting her heel down on the bed will impede healing.  Erle Crocker 10/21/2019, 6:19 PM

## 2019-10-21 NOTE — ED Provider Notes (Signed)
Emergency Department Provider Note   I have reviewed the triage vital signs and the nursing notes.   HISTORY  Chief Complaint Shortness of Breath   HPI [name] is a 86 y.o. female who presents the emergency department today secondary to shortness of breath.  History is obtained from patient and paramedics.  Apparently patient started have shortness of breath 1 hour prior to arrival.  On fire department arrival patient reportedly had a pulse ox of 89% so put on a nonrebreather.  No other interventions prior to the evaluation here.  Clear lung sounds with EMS.  Sinus tachycardia.  Has a history of anxiety as well.  Patient without cough or pain.  No other associated symptoms.  Patient not able to offer any other history.   No other associated or modifying symptoms.    Past Medical History:  Diagnosis Date  . Anxiety   . Arthritis   . CAD (coronary artery disease) 2007   a. 1997 s/p PCI of diagonal.  . Constipation   . COPD/Asthma   . Diverticulosis   . Esophageal reflux   . Family history of adverse reaction to anesthesia    daughter had trouble breathing after surgery, had to be reintubated  . Glaucoma   . HTN (hypertension), benign   . Hyperlipidemia   . Malignant melanoma ([place])    a. 09/2012 s/p resection.  . Neuropathy    lower legs  . PAF (paroxysmal atrial fibrillation) (HCC)    a. intolerant to beta blockers due to asthma and intolerant to CCB due to rash;  b. CHA2DS2VASc = 6-->chronic coumadin;  c. 03/2014 Echo: EF 60-65%, Gr 1 DD, triv AI, mildly dil RV, PASP 26mmhg.  [name] TIA (transient ischemic attack)    a. 02/2011 - chronic coumadin in setting of PAF.  [name] Urinary, incontinence, stress female     Patient Active Problem List   Diagnosis Date Noted  . Acute osteomyelitis of right foot ([place]) 10/21/2019  . Pressure injury of skin 08/09/2019  . Cellulitis 08/08/2019  . Elevated IgE level 07/27/2019  . Healthcare maintenance 07/27/2019  . Palpitation  01/28/2019  . Atrial fibrillation with RVR ([place]) 01/28/2019  . Pain in left knee 11/24/2018  . Pain in right knee 11/24/2018  . Closed fracture of distal end of left radius 08/29/2018  . On continuous oral anticoagulation   . Hip fracture ([place]) 04/19/2017  . Closed left hip fracture, initial encounter ([place]) 04/18/2017  . Ulcer of toe of left foot, with necrosis of bone ([place]) 10/08/2016  . Non-pressure chronic ulcer of other part of left foot limited to breakdown of skin ([place]) 09/24/2016  . Acute asthma exacerbation 09/21/2016  . Allergic rhinitis 09/21/2016  . Anxiety 09/21/2016  . Arthritis 09/21/2016  . Breath shortness 09/21/2016  . Glaucoma 09/21/2016  . Adult hypothyroidism 09/21/2016  . Unilateral primary osteoarthritis, right knee 08/27/2016  . Bunion of great toe of right foot 06/12/2016  . Acquired hammer toe of left foot   . Irregular bowel habits 10/25/2014  . Cerumen impaction 09/26/2014  . Essential hypertension 03/22/2014  . Wrist laceration 03/01/2014  . Primary open angle glaucoma of left eye, moderate stage 11/16/2013  . Primary open angle glaucoma of right eye, mild stage 11/16/2013  . Persistent atrial fibrillation ([place])   . Hyperlipidemia LDL goal <70   . Long term current use of anticoagulant therapy 09/24/2012  . CAD (coronary artery disease) 09/24/2012  . Transient cerebral ischemia 03/05/2011  . WEIGHT  LOSS 08/23/2009  . CHANGE IN BOWELS 08/23/2009  . COLONIC POLYPS, ADENOMATOUS, HX OF 08/23/2009  . Anxiety state 07/02/2009  . Allergic rhinitis due to pollen 11/13/2008  . Major depressive disorder, single episode, moderate ([place]) 05/14/2008  . Sinus infection 07/13/2007  . Asthma-COPD overlap syndrome ([place]) 06/15/2007    Past Surgical History:  Procedure Laterality Date  . ABDOMINAL HYSTERECTOMY    . APPENDECTOMY    . BACK SURGERY     lumbar laminectomy  . CHOLECYSTECTOMY    . COLONOSCOPY    . CORONARY ANGIOPLASTY WITH STENT PLACEMENT    . EYE  SURGERY    . NASAL SINUS SURGERY    . VESICOVAGINAL FISTULA CLOSURE W/ TAH    . WEIL OSTEOTOMY Left 06/12/2016   Procedure: Left [place] and [place] Left [name], Weil Osteotomy 2nd and 3rd Metatarsal;  Surgeon: Newt Minion, MD;  Location: [facility];  Service: Orthopedics;  Laterality: Left;    Current Outpatient Rx  . Order #: [id number] Class: Historical Med  . Order #: [id number] Class: Normal  . Order #: [id number] Class: Normal  . Order #: [id number] Class: Historical Med  . Order #: [id number] Class: Normal  . Order #: [id number] Class: Historical Med  . Order #: [id number] Class: Historical Med  . Order #: [id number] Class: Historical Med  . Order #: [id number] Class: Historical Med  . Order #: [id number] Class: Normal  . Order #: [id number] Class: Normal  . Order #: [id number] Class: Normal  . Order #: [id number] Class: Historical Med  . Order #: [id number] Class: Normal  . Order #: [id number] Class: Historical Med  . Order #: [id number] Class: Normal  . Order #: [id number] Class: Normal  . Order #: [id number] Class: Historical Med  . Order #: [id number] Class: Historical Med  . Order #: [id number] Class: Historical Med  . Order #: [id number] Class: Print    Allergies Alphagan [brimonidine], Apraclonidine, Biaxin [clarithromycin], Brovana [arformoterol], Budesonide, Cefdinir, Cephalexin, Cifenline, Clonidine, Clonidine derivatives, Dorzolamide, Iodinated diagnostic agents, Iohexol, Nsaids, Penicillins, Sulfamethoxazole, Sulfonamide derivatives, Tetracycline, Timolol, Vancomycin, Zetia [ezetimibe], and Cardizem [diltiazem hcl]  Family History  Problem Relation Age of Onset  . Heart disease Father   . Hemachromatosis Father   . Heart attack Father   . Colon cancer Mother   . Peripheral vascular disease Mother   . Peripheral vascular disease Sister   . Cancer Sister   . Lymphoma Sister   . Heart attack Brother   . Heart disease Brother     Social History Social History   Tobacco Use  . Smoking status:  Former Smoker    Packs/day: 0.20    Years: 25.00    Pack years: 5.00    Types: Cigarettes    Quit date: 07/06/1966    Years since quitting: 53.3  . Smokeless tobacco: Never Used  Substance Use Topics  . Alcohol use: No    Alcohol/week: 0.0 standard drinks  . Drug use: No    Review of Systems  All other systems negative except as documented in the HPI. All pertinent positives and negatives as reviewed in the HPI. ____________________________________________   PHYSICAL EXAM:  VITAL SIGNS: Vitals:   10/21/19 0242 10/21/19 0243 10/21/19 0245 10/21/19 0300  BP: 123/70  117/62 112/64  Pulse: (!) 126  (!) 124 (!) 129  Resp: (!) 31  (!) 25 (!) 27  Temp: 99.1 F (37.3 C)     TempSrc: Oral     SpO2: 93% (!) 89% 93% 97%  Weight: 59.4 kg     Height: 5\' 5"  (1.651 m)  Constitutional: Alert and oriented. Well appearing and in no acute distress. Eyes: Conjunctivae are normal. PERRL. EOMI. Head: Atraumatic. Nose: No congestion/rhinnorhea. Mouth/Throat: Mucous membranes are moist.  Oropharynx non-erythematous. Neck: No stridor.  No meningeal signs.   Cardiovascular: Normal rate, regular rhythm. Good peripheral circulation. Grossly normal heart sounds.   Respiratory: Normal respiratory effort.  No retractions. Lungs CTAB. Gastrointestinal: Soft and nontender. No distention.  Musculoskeletal: No lower extremity tenderness nor edema. No gross deformities of extremities. Neurologic:  Normal speech and language. No gross focal neurologic deficits are appreciated. Mild hand tremor. Skin:  Skin is warm, dry and intact. No rash noted.  ____________________________________________   LABS (all labs ordered are listed, but only abnormal results are displayed)  Labs Reviewed  CBC WITH DIFFERENTIAL/PLATELET - Abnormal; Notable for the following components:      Result Value   RBC 2.60 (*)    Hemoglobin 8.3 (*)    HCT 26.1 (*)    MCV 100.4 (*)    RDW 19.4 (*)    Platelets 121 (*)     Neutro Abs 8.0 (*)    Lymphs Abs 0.2 (*)    All other components within normal limits  COMPREHENSIVE METABOLIC PANEL - Abnormal; Notable for the following components:   Sodium 134 (*)    Glucose, Bld 143 (*)    BUN 26 (*)    Creatinine, Ser 1.24 (*)    Calcium 8.5 (*)    Albumin 3.2 (*)    GFR calc non Af Amer 37 (*)    GFR calc Af Amer 43 (*)    All other components within normal limits  PROTIME-INR - Abnormal; Notable for the following components:   Prothrombin Time 76.0 (*)    INR 9.3 (*)    All other components within normal limits  BRAIN NATRIURETIC PEPTIDE - Abnormal; Notable for the following components:   B Natriuretic Peptide 275.2 (*)    All other components within normal limits  CULTURE, BLOOD (ROUTINE X 2)  CULTURE, BLOOD (ROUTINE X 2)  SARS CORONAVIRUS 2 (TAT 6-24 HRS)  TROPONIN I (HIGH SENSITIVITY)  TROPONIN I (HIGH SENSITIVITY)   ____________________________________________  EKG   EKG Interpretation  Date/Time:  Saturday October 21 2019 02:40:52 EDT Ventricular Rate:  138 PR Interval:    QRS Duration: 97 QT Interval:  318 QTC Calculation: 482 R Axis:   81 Text Interpretation: Atrial fibrillation with rapid V-rate Paired ventricular premature complexes Borderline right axis deviation Low voltage, extremity leads Repolarization abnormality, prob rate related Artifact in lead(s) I II III aVR aVL aVF V1 V2 V3 V4 V5 V6 not able to fully interpret 2/2 artifact Confirmed by Merrily Pew [phone]) on 10/21/2019 2:44:49 AM       ____________________________________________  RADIOLOGY  CT Chest Wo Contrast  Addendum Date: 10/21/2019   ADDENDUM REPORT: 10/21/2019 06:14 ADDENDUM: Ascending thoracic aortic aneurysm. Recommend semi-annual imaging followup by CTA or MRA and referral to cardiothoracic surgery if not already obtained. This recommendation follows 2010 ACCF/AHA/AATS/ACR/ASA/SCA/SCAI/SIR/STS/SVM Guidelines for the Diagnosis and Management of Patients With  Thoracic Aortic Disease. Circulation. 2010; [id number]. Aortic aneurysm NOS (ICD10-I71.9) Electronically Signed   By: Ulyses Jarred M.D.   On: 10/21/2019 06:14   Result Date: 10/21/2019 CLINICAL DATA:  Pleural effusion EXAM: CT CHEST WITHOUT CONTRAST TECHNIQUE: Multidetector CT imaging of the chest was performed following the standard protocol without IV contrast. COMPARISON:  Chest radiograph 10/21/2019 FINDINGS: Cardiovascular: Calcific aortic atherosclerosis. Ascending thoracic aorta measures 4.5 cm. There is atherosclerotic calcification  of the coronary arteries. Moderate cardiomegaly. Mediastinum/Nodes: No enlarged mediastinal or axillary lymph nodes. Thyroid gland, trachea, and esophagus demonstrate no significant findings. Lungs/Pleura: Intermediate sized pleural effusions. Left basilar atelectasis. Airways patent. Upper Abdomen: Status post cholecystectomy. Musculoskeletal: IMPRESSION: 1. Intermediate sized bilateral pleural effusions with left basilar atelectasis. 2. Coronary artery and. Ascending thoracic aortic aneurysm, measuring up to 4.5 cm. 3. Aortic Atherosclerosis (ICD10-I70.0). Electronically Signed: By: Ulyses Jarred M.D. On: 10/21/2019 04:36   DG Chest Portable 1 View  Result Date: 10/21/2019 CLINICAL DATA:  Shortness of breath EXAM: PORTABLE CHEST 1 VIEW COMPARISON:  08/08/2019 FINDINGS: Small left pleural effusion, slightly increased in size. Left basilar atelectasis. Cardiomediastinal contours are normal. There is calcific aortic atherosclerosis. Right lung is clear. IMPRESSION: Slight increase in size of small left pleural effusion with associated atelectasis. Electronically Signed   By: Ulyses Jarred M.D.   On: 10/21/2019 03:04   DG Foot Complete Right  Result Date: 10/21/2019 CLINICAL DATA:  Evaluate for osteomyelitis EXAM: RIGHT FOOT COMPLETE - 3+ VIEW COMPARISON:  None. FINDINGS: Osteopenia. Hallux valgus deformity with secondary osteoarthritis of the first  metatarsophalangeal joint. Suboptimal patient positioning. Diffuse soft tissue swelling. Soft tissue irregularity about the heel with suspicion of cortical irregularity and osteolysis within the subjacent calcaneus. Small Achilles and calcaneal spurs. IMPRESSION: 1. Soft tissue irregularity about the heel with underlying cortical irregularity and possible osteolysis. Findings are suspicious for osteomyelitis. 2. Hallux valgus deformity with secondary osteoarthritis. Electronically Signed   By: Abigail Miyamoto M.D.   On: 10/21/2019 06:00    ____________________________________________   PROCEDURES  Procedure(s) performed:   .Critical Care Performed by: Merrily Pew, MD Authorized by: Merrily Pew, MD   Critical care provider statement:    Critical care time (minutes):  45   Critical care was necessary to treat or prevent imminent or life-threatening deterioration of the following conditions:  Shock and circulatory failure   Critical care was time spent personally by me on the following activities:  Discussions with consultants, evaluation of patient's response to treatment, examination of patient, ordering and performing treatments and interventions, ordering and review of laboratory studies, ordering and review of radiographic studies, pulse oximetry, re-evaluation of patient's condition, obtaining history from patient or surrogate and review of old charts     ____________________________________________   INITIAL IMPRESSION / [place] / ED COURSE  Further history obtained from great granddaughter who helps take care of her.  It sounds like the patient had a few days of progressively worsening lower extremity swelling.  Also her right leg has a wound on the heel from try to wear his shoes that are too small.  The patient's granddaughter has started her back on Keflex after an ED visit.  Has also been doing local wound care as well.  Patient subsequently started having shortness  of breath.    With this new information I suspect what is going on and she has cellulitis of her right leg and possible osteomyelitis is caused her to be in A. fib RVR.  I think this is probably was causing pleural effusion be worsening her shortness of breath to be worse.  She also has a supratherapeutic INR without active bleeding so we will hold on any kind of reversal there.  Her labs are consistent with mild fluid overload but secondary to the possible sepsis/infection also consider that this is just reactive and physiologic rather than primary A. fib.  We will hold on cardioversion.   Discussed with hospitalist for  admission. Requests adding vancomycin and U/S.  Pertinent labs & imaging results that were available during my care of the patient were reviewed by me and considered in my medical decision making (see chart for details).  ____________________________________________  FINAL CLINICAL IMPRESSION(S) / ED DIAGNOSES  Final diagnoses:  Osteomyelitis of right foot, unspecified type (HCC)  Cellulitis of right lower extremity  Atrial fibrillation with rapid ventricular response (HCC)  Hypervolemia, unspecified hypervolemia type  Supratherapeutic INR     MEDICATIONS GIVEN DURING THIS VISIT:  Medications  cefTRIAXone (ROCEPHIN) 1 g in sodium chloride 0.9 % 100 mL IVPB (1 g Intravenous New Bag/Given 10/21/19 0621)  vancomycin (VANCOCIN) 1,188 mg in sodium chloride 0.9 % 500 mL IVPB (has no administration in time range)  metoprolol tartrate (LOPRESSOR) injection 5 mg (has no administration in time range)  albuterol (VENTOLIN HFA) 108 (90 Base) MCG/ACT inhaler 2 puff (2 puffs Inhalation Given 10/21/19 0308)  ALPRAZolam Duanne Moron) tablet 0.5 mg (0.5 mg Oral Given 10/21/19 0308)     NEW OUTPATIENT MEDICATIONS STARTED DURING THIS VISIT:  New Prescriptions   No medications on file    Note:  This note was prepared with assistance of Dragon voice recognition software. Occasional  wrong-word or sound-a-like substitutions may have occurred due to the inherent limitations of voice recognition software.   Anabia Weatherwax, Corene Cornea, MD 10/21/19 [phone]

## 2019-10-21 NOTE — ED Triage Notes (Signed)
Pt from home, started having sob around 0130, unrelieved with neb tx. Room air sats with Fire 89%, increased to 100% in NRB. Denied any pain. 20g IV to L AC. EKG showing afib 120-130

## 2019-10-21 NOTE — ED Notes (Signed)
Pt moved to hospital bed.

## 2019-10-21 NOTE — ED Notes (Signed)
Pt pressures down into the low 80's, provider notified and fluid bolus given.

## 2019-10-21 NOTE — Progress Notes (Signed)
Pharmacy Antibiotic Note  [name] is a 86 y.o. female admitted on 10/21/2019 with SOB.  Patient reports left foot swelling and heel lesion.  Pharmacy has been consulted for vancomycin dosing for possible osteomyelitis on imaging.  She is also on Rocephin.  SCr 1.24, CrCL 26 ml/min, afebrile, WBC WNL.  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    Allergies  Allergen Reactions  . Alphagan [Brimonidine] Other (See Comments)    Burning sensation  . Apraclonidine Other (See Comments)    Pain, brow pain, tender, not able to tolerate  . Biaxin [Clarithromycin] Other (See Comments)    Unknown, per pt   . Brovana [Arformoterol] Other (See Comments)    Makes my heart race  . Budesonide Other (See Comments)    Makes my heart race  . Cefdinir Other (See Comments)    "FEEL HORRIBLE"  . Cephalexin Itching    Pt tolerated Rocephin 08/2019  . Cifenline Itching  . Clonidine Other (See Comments)    unknown  . Clonidine Derivatives Other (See Comments)    REACTION: unknown  . Dorzolamide Other (See Comments)    Redness  . Iodinated Diagnostic Agents Other (See Comments)    unknown  . Iohexol Other (See Comments)     Desc: NOTES FROM PRIOR CT STATES PRE MEDICATION PRIOR TO OMNIPAQUE ENHANCED CT   . Nsaids Other (See Comments)    Unknown  . Penicillins Itching, Swelling and Other (See Comments)    Swelling and edema of ears and rash Swelling to ears  Has patient had a PCN reaction causing  immediate rash, facial/tongue/throat swelling, SOB or lightheadedness with hypotension: {no Has patient had a PCN reaction causing severe rash involving mucus membranes or skin necrosis: {no Has patient had a PCN reaction that required hospitalization no Has patient had a PCN reaction occurring within the last 10 years: no If all of the above answers are "NO", then may proceed with Cephalosporin use.  . Sulfamethoxazole Other (See Comments)    Unknown  . Sulfonamide Derivatives Itching  . Tetracycline Itching and Other (See Comments)    unknown  . Timolol Other (See Comments)    Eye red and irritated   . Vancomycin Other (See Comments)    Turn red  . Zetia [Ezetimibe] Other (See Comments)    Unknown  . Cardizem [Diltiazem Hcl] Rash    Vanc 4/17 >> CTX 4/17 >>   4/17 BCx -  4/17 covid -   Deserae Jennings D. Mina Marble, PharmD, BCPS, [place] 10/21/2019, 6:51 AM

## 2019-10-21 NOTE — ED Notes (Signed)
Pt went to MRI.

## 2019-10-21 NOTE — ED Notes (Signed)
Pt to ct 

## 2019-10-22 ENCOUNTER — Inpatient Hospital Stay (HOSPITAL_COMMUNITY): Payer: Medicare HMO

## 2019-10-22 DIAGNOSIS — R791 Abnormal coagulation profile: Secondary | ICD-10-CM

## 2019-10-22 DIAGNOSIS — I714 Abdominal aortic aneurysm, without rupture: Secondary | ICD-10-CM | POA: Diagnosis not present

## 2019-10-22 DIAGNOSIS — I4891 Unspecified atrial fibrillation: Secondary | ICD-10-CM | POA: Diagnosis not present

## 2019-10-22 DIAGNOSIS — M86171 Other acute osteomyelitis, right ankle and foot: Secondary | ICD-10-CM | POA: Diagnosis not present

## 2019-10-22 DIAGNOSIS — I5031 Acute diastolic (congestive) heart failure: Secondary | ICD-10-CM

## 2019-10-22 DIAGNOSIS — N179 Acute kidney failure, unspecified: Secondary | ICD-10-CM | POA: Diagnosis not present

## 2019-10-22 LAB — CBC
HCT: 23.9 % — ABNORMAL LOW (ref 36.0–46.0)
Hemoglobin: 7.7 g/dL — ABNORMAL LOW (ref 12.0–15.0)
MCH: 32.4 pg (ref 26.0–34.0)
MCHC: 32.2 g/dL (ref 30.0–36.0)
MCV: 100.4 fL — ABNORMAL HIGH (ref 80.0–100.0)
Platelets: UNDETERMINED 10*3/uL (ref 150–400)
RBC: 2.38 MIL/uL — ABNORMAL LOW (ref 3.87–5.11)
RDW: 19.4 % — ABNORMAL HIGH (ref 11.5–15.5)
WBC: 10 10*3/uL (ref 4.0–10.5)
nRBC: 0 % (ref 0.0–0.2)

## 2019-10-22 LAB — BASIC METABOLIC PANEL
Anion gap: 8 (ref 5–15)
BUN: 22 mg/dL (ref 8–23)
CO2: 26 mmol/L (ref 22–32)
Calcium: 8.2 mg/dL — ABNORMAL LOW (ref 8.9–10.3)
Chloride: 103 mmol/L (ref 98–111)
Creatinine, Ser: 1.02 mg/dL — ABNORMAL HIGH (ref 0.44–1.00)
GFR calc Af Amer: 55 mL/min — ABNORMAL LOW (ref >60)
GFR calc non Af Amer: 47 mL/min — ABNORMAL LOW (ref >60)
Glucose, Bld: 69 mg/dL — ABNORMAL LOW (ref 70–99)
Potassium: 3.7 mmol/L (ref 3.5–5.1)
Sodium: 137 mmol/L (ref 135–145)

## 2019-10-22 LAB — MAGNESIUM: Magnesium: 1.8 mg/dL (ref 1.7–2.4)

## 2019-10-22 LAB — VITAMIN B12: Vitamin B-12: 1330 pg/mL — ABNORMAL HIGH (ref 180–914)

## 2019-10-22 LAB — PROTIME-INR
INR: 8.2 (ref 0.8–1.2)
Prothrombin Time: 68.5 seconds — ABNORMAL HIGH (ref 11.4–15.2)

## 2019-10-22 LAB — ECHOCARDIOGRAM COMPLETE
Height: 65 in
Weight: 2208.13 oz

## 2019-10-22 MED ORDER — MAGNESIUM SULFATE 2 GM/50ML IV SOLN
2.0000 g | Freq: Once | INTRAVENOUS | Status: AC
  Administered 2019-10-22: 09:00:00 2 g via INTRAVENOUS
  Filled 2019-10-22: qty 50

## 2019-10-22 MED ORDER — METOPROLOL TARTRATE 12.5 MG HALF TABLET: 12.5000 mg | ORAL_TABLET | Freq: Two times a day (BID) | ORAL | Status: DC

## 2019-10-22 MED ORDER — BACITRACIN ZINC 500 UNIT/GM EX OINT
TOPICAL_OINTMENT | Freq: Every day | CUTANEOUS | Status: DC
  Filled 2019-10-22: qty 28.4

## 2019-10-22 MED ORDER — WARFARIN - PHARMACIST DOSING INPATIENT: Freq: Every day | Status: DC

## 2019-10-22 MED ORDER — FUROSEMIDE 20 MG PO TABS
20.0000 mg | ORAL_TABLET | Freq: Every day | ORAL | Status: DC
  Administered 2019-10-22 – 2019-10-25 (×4): 20 mg via ORAL
  Filled 2019-10-22 (×5): qty 1

## 2019-10-22 NOTE — Plan of Care (Signed)
  Problem: Clinical Measurements: Goal: Ability to maintain clinical measurements within normal limits will improve 10/22/2019 0816 by Irish Lack, RN Outcome: Progressing 10/22/2019 0815 by Irish Lack, RN Outcome: Progressing   Problem: Coping: Goal: Level of anxiety will decrease Outcome: Progressing   Problem: Pain Managment: Goal: General experience of comfort will improve 10/22/2019 0816 by Irish Lack, RN Outcome: Progressing 10/22/2019 0815 by Irish Lack, RN Outcome: Progressing

## 2019-10-22 NOTE — Progress Notes (Signed)
PROGRESS NOTE        PATIENT DETAILS Name: [name] Age: 86 y.o. Sex: female Date of Birth: 1923/11/25 Admit Date: 10/21/2019 Admitting Physician Norval Morton, MD OO:[id number], Lenna Sciara, PA-C  Brief Narrative: Patient is a 86 y.o. female with history of chronic atrial fibrillation-on Coumadin, chronic diastolic heart failure, CAD history of remote PCI, COPD/asthma overlap presented with shortness of breath-thought to be secondary to decompensated diastolic heart failure, A. fib with RVR-and also found to have osteomyelitis of the right calcaneus.  See below for further details.  Significant events: 4/17>> admit to [facility]-osteomyelitis of right calcaneus, A. fib with RVR, decompensated heart failure and coagulopathy  Antimicrobial therapy: Vancomycin 4/17>> Rocephin 4/17>>  Microbiology data: 4/7>> blood culture: Negative  Procedures : None  Consults: Orthopedics  DVT Prophylaxis : Supratherapeutic INR-Coumadin on hold  Subjective: Lying comfortably in bed-no major events overnight.  Shortness of breath is improved.  On only 2 L of oxygen this morning  Assessment/Plan: SIRS secondary to right lower extremity soft tissue infection with osteomyelitis and resultant pathological fracture of the right calcaneus: SIRS physiology has resolved.  Continue IV vancomycin/Rocephin-right lower extremity still appears to be swollen, and inflamed/erythematous.  Spoke with patient's great granddaughter-Per her patient and family are in discussion as to whether to try antimicrobial therapy/conservative care versus proceeding with amputation.  Family aware that antimicrobial therapy may not cure this infection-and patient could potentially end up with a amputation in any event, also aware of the risks of metastatic infection and side effects from prolonged antimicrobial therapy use.  Orthopedics following.  Chronic atrial fibrillation with RVR: Heart rate  better controlled-developed transient hypotension yesterday-continue metoprolol-if rate becomes an issue due to hypotension-then we can consider digoxin or amiodarone.  Acute hypoxic respiratory failure secondary to decompensated diastolic heart failure: Volume status much better-only on 2 L of oxygen today-resume oral Lasix and follow.  Supratherapeutic INR: Secondary to Coumadin use-no evidence of bleeding-Coumadin on hold-we will allow it to trend down-unless orthopedics planning surgical intervention sooner-then will give vitamin K/FFP accordingly.  AKI: Improved-likely hemodynamically mediated  Transient hypotension: Likely secondary to medication use-monitor for now.  BP now stable.  COPD/asthma overlap: No wheezing-stable-continue bronchodilators  Anxiety: Appears stable-continue as needed Ativan  Dysphagia: Chronic issue-await SLP eval.  4.5 cm ascending aortic aneurysm seen on CT chest: Stable for outpatient follow-up  Diet: Diet Order            Diet Heart Room service appropriate? Yes; Fluid consistency: Thin  Diet effective now               Code Status:  DNR  Family Communication: Great granddaughter-Stephanie over the phone  Disposition Plan:  Home health vs SNF when ready for discharge  Barriers to Discharge: Right leg soft tissue infection with osteomyelitis requiring IV antibiotics along with decompensated heart failure.    Antimicrobial agents: Anti-infectives (From admission, onward)   Start     Dose/Rate Route Frequency Ordered Stop   10/23/19 0800  vancomycin (VANCOCIN) IVPB 1000 mg/200 mL premix     1,000 mg 100 mL/hr over 120 Minutes Intravenous Every 48 hours 10/21/19 0652     10/22/19 0800  cefTRIAXone (ROCEPHIN) 2 g in sodium chloride 0.9 % 100 mL IVPB     2 g 200 mL/hr over 30 Minutes Intravenous Every 24 hours 10/21/19 0647  10/21/19 0700  vancomycin (VANCOREADY) IVPB 1250 mg/250 mL     1,250 mg 83.3 mL/hr over 180 Minutes  Intravenous  Once 10/21/19 0630 10/21/19 1140   10/21/19 0700  cefTRIAXone (ROCEPHIN) 1 g in sodium chloride 0.9 % 100 mL IVPB     1 g 200 mL/hr over 30 Minutes Intravenous  Once 10/21/19 0647 10/21/19 1225   10/21/19 0515  cefTRIAXone (ROCEPHIN) 1 g in sodium chloride 0.9 % 100 mL IVPB     1 g 200 mL/hr over 30 Minutes Intravenous  Once 10/21/19 0501 10/21/19 0646       Time spent: 35 minutes-Greater than 50% of this time was spent in counseling, explanation of diagnosis, planning of further management, and coordination of care.  MEDICATIONS: Scheduled Meds: . citalopram  10 mg Oral Daily  . fluticasone furoate-vilanterol  1 puff Inhalation Daily  . latanoprost  1 drop Both Eyes QHS  . metoprolol tartrate  12.5 mg Oral BID  . montelukast  10 mg Oral QHS  . pilocarpine  1 drop Both Eyes BID  . sodium chloride flush  3 mL Intravenous Q12H  . Warfarin - Pharmacist Dosing Inpatient   Does not apply q1600   Continuous Infusions: . cefTRIAXone (ROCEPHIN)  IV 2 g (10/22/19 0832)  . [START ON 10/23/2019] vancomycin     PRN Meds:.acetaminophen **OR** acetaminophen, albuterol, ALPRAZolam, diphenhydrAMINE, fluticasone, ondansetron **OR** ondansetron (ZOFRAN) IV, Resource ThickenUp Clear   PHYSICAL EXAM: Vital signs: Vitals:   10/22/19 0547 10/22/19 0800 10/22/19 0824 10/22/19 1000  BP: 102/62 102/67    Pulse: 99  (!) 103 99  Resp: (!) 24  (!) 21   Temp: 97.8 F (36.6 C) 97.6 F (36.4 C)    TempSrc: Oral Oral    SpO2:   96%   Weight: 62.6 kg     Height:       Filed Weights   10/21/19 0242 10/21/19 2055 10/22/19 0547  Weight: 59.4 kg 62.7 kg 62.6 kg   Body mass index is 22.97 kg/m.   Gen Exam:Alert awake-not in any distress HEENT:atraumatic, normocephalic Chest: B/L clear to auscultation anteriorly-some scattered rhonchi CVS:S1S2 irregular Abdomen:soft non tender, non distended Extremities: Right leg erythematous-swollen-dressing in place. Neurology: Non focal Skin: no  rash  I have personally reviewed following labs and imaging studies  LABORATORY DATA: CBC: Recent Labs  Lab 10/21/19 0319 10/22/19 0526  WBC 8.4 10.0  NEUTROABS 8.0*  --   HGB 8.3* 7.7*  HCT 26.1* 23.9*  MCV 100.4* 100.4*  PLT 121* PLATELET CLUMPS NOTED ON SMEAR, UNABLE TO ESTIMATE    Basic Metabolic Panel: Recent Labs  Lab 10/21/19 0319 10/22/19 0526  NA 134* 137  K 3.6 3.7  CL 98 103  CO2 24 26  GLUCOSE 143* 69*  BUN 26* 22  CREATININE 1.24* 1.02*  CALCIUM 8.5* 8.2*  MG  --  1.8    GFR: Estimated Creatinine Clearance: 31 mL/min (A) (by C-G formula based on SCr of 1.02 mg/dL (H)).  Liver Function Tests: Recent Labs  Lab 10/21/19 0319  AST 24  ALT 16  ALKPHOS 62  BILITOT 1.0  PROT 6.7  ALBUMIN 3.2*   No results for input(s): LIPASE, AMYLASE in the last 168 hours. No results for input(s): AMMONIA in the last 168 hours.  Coagulation Profile: Recent Labs  Lab 10/21/19 0319 10/22/19 0526  INR 9.3* 8.2*    Cardiac Enzymes: No results for input(s): CKTOTAL, CKMB, CKMBINDEX, TROPONINI in the last 168 hours.  BNP (last  3 results) No results for input(s): PROBNP in the last 8760 hours.  Lipid Profile: No results for input(s): CHOL, HDL, LDLCALC, TRIG, CHOLHDL, LDLDIRECT in the last 72 hours.  Thyroid Function Tests: No results for input(s): TSH, T4TOTAL, FREET4, T3FREE, THYROIDAB in the last 72 hours.  Anemia Panel: Recent Labs    10/22/19 0526  VITAMINB12 1,330*    Urine analysis:    Component Value Date/Time   COLORURINE YELLOW 04/05/2018 1344   APPEARANCEUR CLEAR 04/05/2018 1344   LABSPEC 1.009 04/05/2018 1344   PHURINE 6.0 04/05/2018 1344   GLUCOSEU NEGATIVE 04/05/2018 1344   HGBUR NEGATIVE 04/05/2018 1344   BILIRUBINUR NEGATIVE 04/05/2018 1344   KETONESUR NEGATIVE 04/05/2018 1344   PROTEINUR NEGATIVE 04/05/2018 1344   UROBILINOGEN 0.2 03/05/2015 1600   NITRITE NEGATIVE 04/05/2018 1344   LEUKOCYTESUR NEGATIVE 04/05/2018 1344     Sepsis Labs: Lactic Acid, Venous    Component Value Date/Time   LATICACIDVEN 0.9 10/21/2019 2111    MICROBIOLOGY: Recent Results (from the past 240 hour(s))  Blood culture (routine x 2)     Status: None (Preliminary result)   Collection Time: 10/21/19  6:13 AM   Specimen: BLOOD  Result Value Ref Range Status   Specimen Description BLOOD RIGHT ANTECUBITAL  Final   Special Requests   Final    BOTTLES DRAWN AEROBIC AND ANAEROBIC Blood Culture results may not be optimal due to an excessive volume of blood received in culture bottles   Culture   Final    NO GROWTH 1 DAY Performed at [facility], [place] [address], [place], [place] [address]    Report Status PENDING  Incomplete  Blood culture (routine x 2)     Status: None (Preliminary result)   Collection Time: 10/21/19  6:13 AM   Specimen: BLOOD RIGHT WRIST  Result Value Ref Range Status   Specimen Description BLOOD RIGHT WRIST  Final   Special Requests   Final    BOTTLES DRAWN AEROBIC AND ANAEROBIC Blood Culture adequate volume   Culture   Final    NO GROWTH 1 DAY Performed at [facility], [place] [address], [place], [place] [address]    Report Status PENDING  Incomplete  SARS CORONAVIRUS 2 (TAT 6-24 HRS) Nasopharyngeal Nasopharyngeal Swab     Status: None   Collection Time: 10/21/19  7:30 AM   Specimen: Nasopharyngeal Swab  Result Value Ref Range Status   SARS Coronavirus 2 NEGATIVE NEGATIVE Final    Comment: (NOTE) SARS-CoV-2 target nucleic acids are NOT DETECTED. The SARS-CoV-2 RNA is generally detectable in upper and lower respiratory specimens during the acute phase of infection. Negative results do not preclude SARS-CoV-2 infection, do not rule out co-infections with other pathogens, and should not be used as the sole basis for treatment or other patient management decisions. Negative results must be combined with clinical observations, patient history, and epidemiological information. The  expected result is Negative. Fact Sheet for Patients: SugarRoll.be Fact Sheet for Healthcare Providers: https://www.woods-mathews.com/ This test is not yet approved or cleared by the [place] FDA and  has been authorized for detection and/or diagnosis of SARS-CoV-2 by FDA under an Emergency Use Authorization (EUA). This EUA will remain  in effect (meaning this test can be used) for the duration of the COVID-19 declaration under Section 56 4(b)(1) of the Act, 21 U.S.C. section 360bbb-3(b)(1), unless the authorization is terminated or revoked sooner. Performed at [facility], [place] [address], [place], [place] [address]  RADIOLOGY STUDIES/RESULTS: CT Chest Wo Contrast  Addendum Date: 10/21/2019   ADDENDUM REPORT: 10/21/2019 06:14 ADDENDUM: Ascending thoracic aortic aneurysm. Recommend semi-annual imaging followup by CTA or MRA and referral to cardiothoracic surgery if not already obtained. This recommendation follows 2010 ACCF/AHA/AATS/ACR/ASA/SCA/SCAI/SIR/STS/SVM Guidelines for the Diagnosis and Management of Patients With Thoracic Aortic Disease. Circulation. 2010; [id number]. Aortic aneurysm NOS (ICD10-I71.9) Electronically Signed   By: Ulyses Jarred M.D.   On: 10/21/2019 06:14   Result Date: 10/21/2019 CLINICAL DATA:  Pleural effusion EXAM: CT CHEST WITHOUT CONTRAST TECHNIQUE: Multidetector CT imaging of the chest was performed following the standard protocol without IV contrast. COMPARISON:  Chest radiograph 10/21/2019 FINDINGS: Cardiovascular: Calcific aortic atherosclerosis. Ascending thoracic aorta measures 4.5 cm. There is atherosclerotic calcification of the coronary arteries. Moderate cardiomegaly. Mediastinum/Nodes: No enlarged mediastinal or axillary lymph nodes. Thyroid gland, trachea, and esophagus demonstrate no significant findings. Lungs/Pleura: Intermediate sized pleural effusions. Left basilar atelectasis. Airways  patent. Upper Abdomen: Status post cholecystectomy. Musculoskeletal: IMPRESSION: 1. Intermediate sized bilateral pleural effusions with left basilar atelectasis. 2. Coronary artery and. Ascending thoracic aortic aneurysm, measuring up to 4.5 cm. 3. Aortic Atherosclerosis (ICD10-I70.0). Electronically Signed: By: Ulyses Jarred M.D. On: 10/21/2019 04:36   MR HEEL RIGHT WO CONTRAST  Result Date: 10/21/2019 CLINICAL DATA:  Possible calcaneal osteomyelitis. EXAM: MR OF THE RIGHT HEEL WITHOUT CONTRAST TECHNIQUE: Multiplanar, multisequence MR imaging of the ankle was performed. No intravenous contrast was administered. COMPARISON:  None. FINDINGS: Despite efforts by the technologist and patient, motion artifact is present on today's exam and could not be eliminated. This reduces exam sensitivity and specificity. TENDONS Peroneal: Mild common peroneus tenosynovitis. Posteromedial: Unremarkable Anterior: Unremarkable Achilles: Unremarkable Plantar Fascia: Mild thickening of the medial band of the plantar fascia with adjacent edema signal along the superficial margin of the plantar fascia. This may well reflect plantar fasciitis. LIGAMENTS Lateral: Unremarkable Medial: Unremarkable CARTILAGE Ankle Joint: Small posterior joint effusion. Subtalar Joints/Sinus Tarsi: Unremarkable Bones: There is a fracture of the calcaneus extending from the posterior calcaneal body to the posteroinferior margin of the heel as shown on axial images 14-24 of series 6. Along the lower margin of this fracture, there is widening of the fracture plane with accentuated edema, as well as a track of speckled gas densities extending from the fracture plane itself in the subcutaneous tissues towards the posteromedial cutaneous surface of the ankle is shown on images 21 through 25 of series 6. The appearance is highly suspicious for osteomyelitis complicating the posterior ankle fracture, and it is possible that osteomyelitis predisposed to fracture  due to cortical weakening. I do not observe a drainable abscess. Other: Diffuse subcutaneous edema along the the visualized ankle and foot. Cellulitis not excluded. This is more confluent along the posteromedial heel and associated with probable skin ulceration in this vicinity. IMPRESSION: 1. Fracture of the calcaneus extending from the posterior-superior calcaneal body to the posteroinferior margin of the heel. There is widening of the fracture plane with accentuated edema signal along the lower margin of this fracture, and a track of speckled gas densities extending from the fracture plane itself towards the posteromedial cutaneous surface of the ankle. The appearance is highly suspicious for osteomyelitis, and it is possible that osteomyelitis predisposed to the fracture due to cortical weakening. 2. Diffuse subcutaneous edema along the visualized ankle and foot. Cellulitis not excluded. 3. Mild common peroneus tenosynovitis. 4. Mild thickening of the medial band of the plantar fascia with adjacent edema signal, may reflect plantar fasciitis. 5. Small posterior  joint effusion at the tibiotalar joint. 6. Despite efforts by the technologist and patient, motion artifact is present on today's exam and could not be eliminated. This reduces exam sensitivity and specificity. Electronically Signed   By: Van Clines M.D.   On: 10/21/2019 13:48   DG Chest Portable 1 View  Result Date: 10/21/2019 CLINICAL DATA:  Shortness of breath EXAM: PORTABLE CHEST 1 VIEW COMPARISON:  08/08/2019 FINDINGS: Small left pleural effusion, slightly increased in size. Left basilar atelectasis. Cardiomediastinal contours are normal. There is calcific aortic atherosclerosis. Right lung is clear. IMPRESSION: Slight increase in size of small left pleural effusion with associated atelectasis. Electronically Signed   By: Ulyses Jarred M.D.   On: 10/21/2019 03:04   DG Foot Complete Right  Result Date: 10/21/2019 CLINICAL DATA:   Evaluate for osteomyelitis EXAM: RIGHT FOOT COMPLETE - 3+ VIEW COMPARISON:  None. FINDINGS: Osteopenia. Hallux valgus deformity with secondary osteoarthritis of the first metatarsophalangeal joint. Suboptimal patient positioning. Diffuse soft tissue swelling. Soft tissue irregularity about the heel with suspicion of cortical irregularity and osteolysis within the subjacent calcaneus. Small Achilles and calcaneal spurs. IMPRESSION: 1. Soft tissue irregularity about the heel with underlying cortical irregularity and possible osteolysis. Findings are suspicious for osteomyelitis. 2. Hallux valgus deformity with secondary osteoarthritis. Electronically Signed   By: Abigail Miyamoto M.D.   On: 10/21/2019 06:00   VAS [place] LOWER EXTREMITY VENOUS (DVT) (ONLY [facility] & [facility])  Result Date: 10/21/2019  Lower Venous DVTStudy Indications: Swelling, and ulceration.  Limitations: Clothing, confusion, and movement. Comparison Study: Prior study from 09/28/14 is available for comparison Performing Technologist: Sharion Dove RVS  Examination Guidelines: A complete evaluation includes B-mode imaging, spectral Doppler, color Doppler, and power Doppler as needed of all accessible portions of each vessel. Bilateral testing is considered an integral part of a complete examination. Limited examinations for reoccurring indications may be performed as noted. The reflux portion of the exam is performed with the patient in reverse Trendelenburg.  +---------+---------------+---------+-----------+----------+--------------+ RIGHT    CompressibilityPhasicitySpontaneityPropertiesThrombus Aging +---------+---------------+---------+-----------+----------+--------------+ CFV      Full           Yes      Yes                                 +---------+---------------+---------+-----------+----------+--------------+ SFJ      Full                                                         +---------+---------------+---------+-----------+----------+--------------+ FV Prox  Full                                                        +---------+---------------+---------+-----------+----------+--------------+ FV Mid   Full                                                        +---------+---------------+---------+-----------+----------+--------------+ FV DistalFull                                                        +---------+---------------+---------+-----------+----------+--------------+  PFV      Full                                                        +---------+---------------+---------+-----------+----------+--------------+ POP      Full           Yes      Yes                                 +---------+---------------+---------+-----------+----------+--------------+ PTV      Full                                                        +---------+---------------+---------+-----------+----------+--------------+ PERO     Full                                                        +---------+---------------+---------+-----------+----------+--------------+   Left Technical Findings: Left leg not evaluated.   Summary: RIGHT: - Findings appear essentially unchanged compared to previous examination. - There is no evidence of deep vein thrombosis in the lower extremity.   *See table(s) above for measurements and observations. Electronically signed by Monica Martinez MD on 10/21/2019 at 3:11:05 PM.    Final      LOS: 1 day   Oren Binet, MD  [facility]    To contact the attending provider between 7A-7P or the covering provider during after hours 7P-7A, please log into the web site [email or web address] and access using universal [place] password for that web site. If you do not have the password, please call the hospital operator.  10/22/2019, 11:03 AM

## 2019-10-22 NOTE — Consult Note (Addendum)
WOC Nurse Consult Note: Reason for Consult: Chronic nonhealing pressure wound to right heel with osteomyelitis, Unstageable due to the presence of necrotic tissue. Seen by orthopedics yesterday (Dr. Lucia Gaskins).  Left foot with chronic nonhealing wound with hardware protruding (screw) from long ago surgical intervention to correct a hammer toe. Family (daughter and granddaughter) present for my assessment today. Wound type: Pressure (right), surgical complication (left) Pressure Injury POA: Yes (right) Measurement: Right heel:  4cm x 3.5cm area of black eschar with <25% red tissue evident.  Unstageable.  Strong odor consistent with necrotic tissue present. Serosanguinous drainage in a moderate amount on old dressing.  Small amount of drainage at this time. Left foot, plantar aspect at 2nd metatarsal head:  1cm round with unknown depth due to hardware in wound bed. Small amount of light yellow wound drainage on old adhesive strip bandage. Patient cares for this wound at home previous to this admission. Wound bed:As described above. Drainage (amount, consistency, odor) As described above. Periwound: intact, dry Dressing procedure/placement/frequency: I will provide Nursing with guidance for the care of these two wounds while she is being worked up and assessed by [employer]. The left chronic wound will be dressed daily with bacitracin ointment and a dry dressing.  The right heel with bony destruction and osteomyelitis will be dressed twice daily with xeroform gauze and secured with Kerlix roll gauze and paper tape.   Both heels will be floated with Prevalon pressure redistribution heel boots. A silicone foam dressing will be placed to the sacrum to prevent pressure injury in that area. A pressure redistribution chair pad is provided for her use when OOB to the chair and she may take this with her upon discharge.  [facility] nursing team will not follow, but will remain available to this patient, the nursing and  medical teams.  Please re-consult if needed. Thanks, Maudie Flakes, MSN, RN, [place], Arther Abbott  Pager# [phone]

## 2019-10-22 NOTE — Progress Notes (Signed)
ANTICOAGULATION CONSULT NOTE - Initial Consult  Pharmacy Consult for warfarin Indication: atrial fibrillation  Allergies  Allergen Reactions  . Alphagan [Brimonidine] Other (See Comments)    Burning sensation  . Apraclonidine Other (See Comments)    Pain, brow pain, tender, not able to tolerate  . Biaxin [Clarithromycin] Other (See Comments)    Unknown, per pt   . Brovana [Arformoterol] Other (See Comments)    Makes my heart race  . Budesonide Other (See Comments)    Makes my heart race  . Cefdinir Other (See Comments)    "FEEL HORRIBLE"  . Cephalexin Itching    Pt tolerated Rocephin 08/2019  . Cifenline Itching  . Clonidine Other (See Comments)    unknown  . Clonidine Derivatives Other (See Comments)    REACTION: unknown  . Dorzolamide Other (See Comments)    Redness  . Iodinated Diagnostic Agents Other (See Comments)    unknown  . Iohexol Other (See Comments)     Desc: NOTES FROM PRIOR CT STATES PRE MEDICATION PRIOR TO OMNIPAQUE ENHANCED CT   . Nsaids Other (See Comments)    Unknown  . Penicillins Itching, Swelling and Other (See Comments)    Swelling and edema of ears and rash Swelling to ears  Has patient had a PCN reaction causing immediate rash, facial/tongue/throat swelling, SOB or lightheadedness with hypotension: {no Has patient had a PCN reaction causing severe rash involving mucus membranes or skin necrosis: {no Has patient had a PCN reaction that required hospitalization no Has patient had a PCN reaction occurring within the last 10 years: no If all of the above answers are "NO", then may proceed with Cephalosporin use.  . Sulfamethoxazole Other (See Comments)    Unknown  . Sulfonamide Derivatives Itching  . Tetracycline Itching and Other (See Comments)    unknown  . Timolol Other (See Comments)    Eye red and irritated   . Vancomycin Other (See Comments)    Turn red  . Zetia [Ezetimibe] Other (See Comments)    Unknown  . Cardizem [Diltiazem Hcl]  Rash    Patient Measurements: Height: 5\' 5"  (165.1 cm) Weight: 62.6 kg (138 lb 0.1 oz) IBW/kg (Calculated) : 57  Vital Signs: Temp: 97.8 F (36.6 C) (04/18 0547) Temp Source: Oral (04/18 0547) BP: 102/62 (04/18 0547) Pulse Rate: 99 (04/18 0547)  Labs: Recent Labs    10/21/19 0319 10/21/19 0613 10/22/19 0526  HGB 8.3*  --   --   HCT 26.1*  --   --   PLT 121*  --   --   LABPROT 76.0*  --  68.5*  INR 9.3*  --  8.2*  CREATININE 1.24*  --  1.02*  TROPONINIHS 4 6  --     Estimated Creatinine Clearance: 31 mL/min (A) (by C-G formula based on SCr of 1.02 mg/dL (H)).   Medical History: Past Medical History:  Diagnosis Date  . Anxiety   . Arthritis   . CAD (coronary artery disease) 2007   a. 1997 s/p PCI of diagonal.  . Constipation   . COPD/Asthma   . Diverticulosis   . Esophageal reflux   . Family history of adverse reaction to anesthesia    daughter had trouble breathing after surgery, had to be reintubated  . Glaucoma   . HTN (hypertension), benign   . Hyperlipidemia   . Malignant melanoma ([place])    a. 09/2012 s/p resection.  . Neuropathy    lower legs  . PAF (paroxysmal atrial fibrillation) (  [place])    a. intolerant to beta blockers due to asthma and intolerant to CCB due to rash;  b. CHA2DS2VASc = 6-->chronic coumadin;  c. 03/2014 Echo: EF 60-65%, Gr 1 DD, triv AI, mildly dil RV, PASP 50mmhg.  [name] TIA (transient ischemic attack)    a. 02/2011 - chronic coumadin in setting of PAF.  [name] Urinary, incontinence, stress female    Assessment: 8 YOF presenting with SOB, on warfarin for Afib.  INR on admission supratherapeutic at 9.3, received vitamin K 2.5mg  PO 4/17.  INR this am still supratherapeutic at 8.2.  PTA warfarin dosing: 4mg  daily  Goal of Therapy:  INR 2-3 Monitor platelets by anticoagulation protocol: Yes   Plan:  Will continue to Hold warfarin tonight Daily INR, s/s bleeding  Alanda Slim, PharmD, [facility] Clinical Pharmacist Please see AMION for all  Pharmacists' Contact Phone Numbers 10/22/2019, 8:07 AM

## 2019-10-22 NOTE — Plan of Care (Signed)
  Problem: Clinical Measurements: Goal: Ability to maintain clinical measurements within normal limits will improve Outcome: Progressing   Problem: Coping: Goal: Level of anxiety will decrease Outcome: Progressing   Problem: Pain Managment: Goal: General experience of comfort will improve Outcome: Progressing   

## 2019-10-22 NOTE — Evaluation (Signed)
Clinical/Bedside Swallow Evaluation Patient Details  Name: [name] MRN: [id number] Date of Birth: Aug 29, 1923  Today's Date: 10/22/2019 Time: SLP Start Time (ACUTE ONLY): 1445 SLP Stop Time (ACUTE ONLY): 1502 SLP Time Calculation (min) (ACUTE ONLY): 17 min  Past Medical History:  Past Medical History:  Diagnosis Date  . Anxiety   . Arthritis   . CAD (coronary artery disease) 2007   a. 1997 s/p PCI of diagonal.  . Constipation   . COPD/Asthma   . Diverticulosis   . Esophageal reflux   . Family history of adverse reaction to anesthesia    daughter had trouble breathing after surgery, had to be reintubated  . Glaucoma   . HTN (hypertension), benign   . Hyperlipidemia   . Malignant melanoma ([place])    a. 09/2012 s/p resection.  . Neuropathy    lower legs  . PAF (paroxysmal atrial fibrillation) ([facility])    a. intolerant to beta blockers due to asthma and intolerant to CCB due to rash;  b. CHA2DS2VASc = 6-->chronic coumadin;  c. 03/2014 Echo: EF 60-65%, Gr 1 DD, triv AI, mildly dil RV, PASP 87mmhg.  [name] TIA (transient ischemic attack)    a. 02/2011 - chronic coumadin in setting of PAF.  [name] Urinary, incontinence, stress female    Past Surgical History:  Past Surgical History:  Procedure Laterality Date  . ABDOMINAL HYSTERECTOMY    . APPENDECTOMY    . BACK SURGERY     lumbar laminectomy  . CHOLECYSTECTOMY    . COLONOSCOPY    . CORONARY ANGIOPLASTY WITH STENT PLACEMENT    . EYE SURGERY    . NASAL SINUS SURGERY    . VESICOVAGINAL FISTULA CLOSURE W/ TAH    . WEIL OSTEOTOMY Left 06/12/2016   Procedure: Left [place] and [place] Left [name], Weil Osteotomy 2nd and 3rd Metatarsal;  Surgeon: Janice Minion, MD;  Location: [facility];  Service: Orthopedics;  Laterality: Left;   HPI:  Pt is a 86 y.o. female with medical history significant of HTN, HLD, persistent atrial fibrillation on Coumadin, CAD s/p stent, COPD/asthma, and anxiety who presented with complaints of shortness of breath.  CT chest: Intermediate sized bilateral pleural effusions with left basilar atelectasis. Pt was noted to have "issues" swallowing pills in ED.   Assessment / Plan / Recommendation Clinical Impression  Pt was seen for bedside swallow evaluation with her daughter and granddaughter present. Pt's granddaughter reported that the pt had difficulty swallowing a pill in the ED but stated that she was doing various "odd" things yesterday which have since resolved. Pt presented with an intermittently wet vocal quality at baseline which was improved with coughing. She tolerated solids without overt s/sx of aspiration. However, she inconsistently exhibited throat clearing and coughing with thin liquids, suggesting possible aspiration. Pt's family reported that she has always done this and that they both have this tendency to cough/throat clear when they drink thin liquids. A modified barium swallow study is recommended to further assess the functional integrity of the swallow mechanism. Pt and family verbalized agreement with recommendation stating, "I've been concerned about her swallowing so I think we should do it". SLP Visit Diagnosis: Dysphagia, pharyngeal phase (R13.13)    Aspiration Risk  Mild aspiration risk    Diet Recommendation Regular;Thin liquid   Liquid Administration via: Cup;Straw Medication Administration: Whole meds with puree Supervision: Patient able to self feed Compensations: Small sips/bites;Slow rate Postural Changes: Seated upright at 90 degrees    Other  Recommendations Oral  Care Recommendations: Oral care BID   Follow up Recommendations Other (comment)(TBD)      Frequency and Duration min 2x/week  2 weeks       Prognosis Prognosis for Safe Diet Advancement: Good      Swallow Study   General Date of Onset: 10/21/19 HPI: Pt is a 86 y.o. female with medical history significant of HTN, HLD, persistent atrial fibrillation on Coumadin, CAD s/p stent, COPD/asthma, and anxiety  who presented with complaints of shortness of breath. CT chest: Intermediate sized bilateral pleural effusions with left basilar atelectasis. Pt was noted to have "issues" swallowing pills in ED. Type of Study: Bedside Swallow Evaluation Previous Swallow Assessment: None Diet Prior to this Study: Regular;Thin liquids Temperature Spikes Noted: No Respiratory Status: Nasal cannula History of Recent Intubation: No Behavior/Cognition: Alert;Cooperative;Pleasant mood Oral Cavity Assessment: Within Functional Limits Oral Care Completed by SLP: No Oral Cavity - Dentition: Adequate natural dentition;Dentures, top;Dentures, bottom Vision: Functional for self-feeding Self-Feeding Abilities: Able to feed self Patient Positioning: Upright in bed;Postural control adequate for testing Baseline Vocal Quality: Wet(Intermittently) Volitional Cough: Weak;Wet Volitional Swallow: Able to elicit    Oral/Motor/Sensory Function Overall Oral Motor/Sensory Function: Within functional limits   Ice Chips Ice chips: Impaired Presentation: Spoon Pharyngeal Phase Impairments: Throat Clearing - Immediate   Thin Liquid Thin Liquid: Impaired Presentation: Cup;Straw Pharyngeal  Phase Impairments: Throat Clearing - Immediate;Cough - Delayed;Cough - Immediate(inconsistent)    Nectar Thick Nectar Thick Liquid: Not tested   Honey Thick Honey Thick Liquid: Not tested   Puree Puree: Within functional limits Presentation: Spoon   Solid     Solid: Within functional limits Presentation: [place] [address], [place], [place] Office number [phone] Pager [phone]  [name] 10/22/2019,4:57 PM

## 2019-10-22 NOTE — Plan of Care (Signed)
  Problem: Education: Goal: Knowledge of General Education information will improve Description Including pain rating scale, medication(s)/side effects and non-pharmacologic comfort measures Outcome: Progressing   Problem: Health Behavior/Discharge Planning: Goal: Ability to manage health-related needs will improve Outcome: Progressing   

## 2019-10-22 NOTE — Progress Notes (Addendum)
Pt arrived from to 5w30 via bed by RN Phil. Pt is A&OX3, breathing equal and unlabored in NAD. Denied pain. MAE well. VS were taken, pt placed of monitors, POC reviewed with pt. Pt has stage one on sacrum, foam applied. Pt has unstageable , black necrotic wound on right heal, dressed witjh A dry drssign.

## 2019-10-22 NOTE — Progress Notes (Signed)
Critical Lab Result: INR 8.2 collected at [phone].  Dr. Oren Binet notified.

## 2019-10-23 ENCOUNTER — Inpatient Hospital Stay (HOSPITAL_COMMUNITY): Payer: Medicare HMO

## 2019-10-23 ENCOUNTER — Other Ambulatory Visit (HOSPITAL_COMMUNITY): Payer: Medicare HMO

## 2019-10-23 DIAGNOSIS — Z01810 Encounter for preprocedural cardiovascular examination: Secondary | ICD-10-CM

## 2019-10-23 DIAGNOSIS — R1310 Dysphagia, unspecified: Secondary | ICD-10-CM | POA: Diagnosis not present

## 2019-10-23 DIAGNOSIS — M86171 Other acute osteomyelitis, right ankle and foot: Principal | ICD-10-CM

## 2019-10-23 DIAGNOSIS — I4891 Unspecified atrial fibrillation: Secondary | ICD-10-CM | POA: Diagnosis not present

## 2019-10-23 DIAGNOSIS — N179 Acute kidney failure, unspecified: Secondary | ICD-10-CM | POA: Diagnosis not present

## 2019-10-23 LAB — COMPREHENSIVE METABOLIC PANEL
ALT: 17 U/L (ref 0–44)
AST: 24 U/L (ref 15–41)
Albumin: 2.3 g/dL — ABNORMAL LOW (ref 3.5–5.0)
Alkaline Phosphatase: 68 U/L (ref 38–126)
Anion gap: 10 (ref 5–15)
BUN: 24 mg/dL — ABNORMAL HIGH (ref 8–23)
CO2: 26 mmol/L (ref 22–32)
Calcium: 8.5 mg/dL — ABNORMAL LOW (ref 8.9–10.3)
Chloride: 102 mmol/L (ref 98–111)
Creatinine, Ser: 1.01 mg/dL — ABNORMAL HIGH (ref 0.44–1.00)
GFR calc Af Amer: 56 mL/min — ABNORMAL LOW (ref >60)
GFR calc non Af Amer: 48 mL/min — ABNORMAL LOW (ref >60)
Glucose, Bld: 91 mg/dL (ref 70–99)
Potassium: 3.7 mmol/L (ref 3.5–5.1)
Sodium: 138 mmol/L (ref 135–145)
Total Bilirubin: 0.6 mg/dL (ref 0.3–1.2)
Total Protein: 5.9 g/dL — ABNORMAL LOW (ref 6.5–8.1)

## 2019-10-23 LAB — MAGNESIUM: Magnesium: 2.2 mg/dL (ref 1.7–2.4)

## 2019-10-23 LAB — PROTIME-INR
INR: 6.6 (ref 0.8–1.2)
Prothrombin Time: 58.1 seconds — ABNORMAL HIGH (ref 11.4–15.2)

## 2019-10-23 LAB — CBC
HCT: 24.3 % — ABNORMAL LOW (ref 36.0–46.0)
Hemoglobin: 7.9 g/dL — ABNORMAL LOW (ref 12.0–15.0)
MCH: 32.5 pg (ref 26.0–34.0)
MCHC: 32.5 g/dL (ref 30.0–36.0)
MCV: 100 fL (ref 80.0–100.0)
Platelets: UNDETERMINED 10*3/uL (ref 150–400)
RBC: 2.43 MIL/uL — ABNORMAL LOW (ref 3.87–5.11)
RDW: 19.6 % — ABNORMAL HIGH (ref 11.5–15.5)
WBC: 10.2 10*3/uL (ref 4.0–10.5)
nRBC: 0 % (ref 0.0–0.2)

## 2019-10-23 NOTE — Progress Notes (Signed)
     [name] is a 86 y.o. female   Orthopaedic diagnosis: Right calcaneal fracture in the setting of osteomyelitis with posterior heel ulceration  Subjective: Patient does not respond to questioning.  I called and spoke with the family.  They are unsure of how they would like to proceed whether with conservative treatment and antibiotic therapy versus surgical intervention.  Objectyive: Vitals:   10/23/19 1200 10/23/19 1540  BP: (!) 94/56 106/60  Pulse: 84 (!) 106  Resp: (!) 22 (!) 32  Temp: (!) 97.1 F (36.2 C) (!) 97.5 F (36.4 C)  SpO2: 98% 96%     Exam: Awake Respirations even and unlabored No acute distress  Right foot with some improvement in erythema and swelling.  Necrotic ulceration tissue present.  Unable to assess motor or sensation due to patient inability to cooperate.  Left foot with small plantar ulceration under the second metatarsal head.  No sign of infection.  No visible hardware.  Assessment: Right calcaneus fracture in the setting of osteomyelitis with posterior heel ulceration Left plantar ulceration under the second metatarsal head   Plan: I called and spoke with the family at length.  They had multiple questions about the possibility of antibiotic suppression versus amputation.  They are worried about the surgical risk and therefore cardiology has been consulted for risk assessment.  I was certain to discuss with him the possibility of complications due to the infection as well as complications due to the surgery.  They voiced understanding at the end of our conversation.  They will continue to discuss among the family and make their decision.  For now she will continue antibiotics.  I did order x-rays of the left foot as the wound care note described visible hardware which I was unable to appreciate.  Orthopedics will continue to follow and await decision by family regarding surgical treatment.   Radene Journey, MD

## 2019-10-23 NOTE — Consult Note (Addendum)
Cardiology Consultation:   Patient ID: RANDEL NETTLE MRN: [id number]; DOB: 1923-12-05  Admit date: 10/21/2019 Date of Consult: 10/23/2019  Primary Care Provider: Julian Hy, PA-C Primary Cardiologist: Fransico Him, MD  Primary Electrophysiologist:  None    Patient Profile:   ASTON WALES is a 86 y.o. female with a hx of CAD s/p PCI to [place], permanent atrial fibrillation on Coumadin, HTN, HLD, COPD and AAA who is being seen today for the evaluation of preoperative cardiac risk in the setting of right calcaneus osteomyelitis at the request of [place].  History of Present Illness:   Ms. Bunten is a 86 year old female with past medical history of CAD s/p PCI to [place], permanent atrial fibrillation on Coumadin, HTN, HLD, COPD and AAA.  Due to multiple recurrence of atrial fibrillation and the asymptomatic nature of her A. fib, it was decided to keep her in permanent atrial fibrillation with rate control strategy.  Patient was last seen virtually by Dr. Radford Pax on 03/08/2019 at which time, she was rate controlled on Lopressor 25 mg twice daily.  She is also on lisinopril and amlodipine for blood pressure management.  Coumadin is being followed by her PCP.   Since the last office visit, she has been admitted in February 2021 with left lower extremity cellulitis, atrial fibrillation with RVR and supratherapeutic INR greater than 10.  For some reason by that time, she was no longer on 25 mg twice daily, instead she is on 12.5 mg twice a day of metoprolol.  There was also some question that she was noncompliant with her rate control strategy, once she was placed back on the metoprolol 12.5 mg her heart rate significantly improved.  Therefore no further rate control adjustment was made.  Since discharge, patient's granddaughter has taken her to her PCPs office and her Coumadin level has improved.  She returned back to the [facility] on 10/21/2019 with new onset  of shortness of breath and right heel osteomyelitis.  Echocardiogram obtained on 10/22/2019 showed EF 50 to 55%, moderately enlarged RV with RV systolic pressure [id number], severely dilated left and the right atriam.  On arrival, her INR level was supratherapeutic again with INR greater than 10.  She was also treated for acute on chronic diastolic heart failure with IV Lasix in the ED.  Heart rate was again elevated, she was placed back on metoprolol.  During the hospitalization, she had transient hypotension requiring discontinuation of lisinopril and amlodipine.  She was admitted for SIRS secondary to right lower extremity soft tissue infection with osteomyelitis and fracture of the right calcaneus.  Orthopedic surgery discussed with family at length regarding surgical risk of amputation versus antibiotic suppression.  Cardiology was consulted for preop clearance.  Talking with the family, prior to February, patient has been able to walk around and cares for herself without any issue.  She lives by herself and her granddaughter only assist her with taking a shower.  She denies any recent exertional chest discomfort.   Past Medical History:  Diagnosis Date  . Anxiety   . Arthritis   . CAD (coronary artery disease) 2007   a. 1997 s/p PCI of diagonal.  . Constipation   . COPD/Asthma   . Diverticulosis   . Esophageal reflux   . Family history of adverse reaction to anesthesia    daughter had trouble breathing after surgery, had to be reintubated  . Glaucoma   . HTN (hypertension), benign   . Hyperlipidemia   .  Malignant melanoma ([place])    a. 09/2012 s/p resection.  . Neuropathy    lower legs  . PAF (paroxysmal atrial fibrillation) (HCC)    a. intolerant to beta blockers due to asthma and intolerant to CCB due to rash;  b. CHA2DS2VASc = 6-->chronic coumadin;  c. 03/2014 Echo: EF 60-65%, Gr 1 DD, triv AI, mildly dil RV, PASP 49mmhg.  [name] TIA (transient ischemic attack)    a. 02/2011 - chronic coumadin  in setting of PAF.  [name] Urinary, incontinence, stress female     Past Surgical History:  Procedure Laterality Date  . ABDOMINAL HYSTERECTOMY    . APPENDECTOMY    . BACK SURGERY     lumbar laminectomy  . CHOLECYSTECTOMY    . COLONOSCOPY    . CORONARY ANGIOPLASTY WITH STENT PLACEMENT    . EYE SURGERY    . NASAL SINUS SURGERY    . VESICOVAGINAL FISTULA CLOSURE W/ TAH    . WEIL OSTEOTOMY Left 06/12/2016   Procedure: Left Chevron and Aiken Left [name], Weil Osteotomy 2nd and 3rd Metatarsal;  Surgeon: Newt Minion, MD;  Location: [place];  Service: Orthopedics;  Laterality: Left;     Home Medications:  Prior to Admission medications   Medication Sig Start Date End Date Taking? Authorizing Provider  acetaminophen (TYLENOL) 650 MG CR tablet Take 1,300 mg by mouth every 8 (eight) hours as needed for pain.   Yes [provider]  albuterol (PROAIR HFA) 108 (90 Base) MCG/ACT inhaler Inhale 1-2 puffs into the lungs every 4 (four) hours as needed for wheezing or shortness of breath. 12/11/15  Yes Juanito Doom, MD  albuterol (PROVENTIL) (2.5 MG/3ML) 0.083% nebulizer solution Take 3 mLs (2.5 mg total) by nebulization every 6 (six) hours as needed for wheezing or shortness of breath. 07/29/18  Yes Juanito Doom, MD  ALPRAZolam Duanne Moron) 0.5 MG tablet Take 0.5 mg by mouth 2 (two) times daily as needed for anxiety.   Yes [provider]  amLODipine (NORVASC) 10 MG tablet TAKE 1 TABLET BY MOUTH EVERY DAY Patient taking differently: Take 10 mg by mouth daily.  07/13/19  Yes Turner, Eber Hong, MD  cephALEXin (KEFLEX) 500 MG capsule Take 500 mg by mouth 4 (four) times daily. For 4 days   Yes [provider]  cetirizine (ZYRTEC) 10 MG tablet Take 10 mg by mouth daily as needed for allergies.   Yes [provider]  citalopram (CELEXA) 10 MG tablet Take 10 mg by mouth daily.   Yes [provider]  clotrimazole-betamethasone (LOTRISONE) cream Apply 1 application  topically 2 (two) times daily as needed for skin breakdown. 07/13/19  Yes [provider]  fluticasone (FLONASE) 50 MCG/ACT nasal spray USE 1 SPRAY IN EACH NOSTRIL ONCE A DAY AS NEEDED FOR ALLERGIES OR RHINITIS Patient taking differently: Place 1 spray into both nostrils daily as needed for allergies or rhinitis.  04/25/19  Yes Juanito Doom, MD  fluticasone furoate-vilanterol (BREO ELLIPTA) 200-25 MCG/INH AEPB Inhale 1 puff into the lungs daily. 01/27/17  Yes Juanito Doom, MD  furosemide (LASIX) 40 MG tablet Take 1 tablet (40 mg total) by mouth daily as needed for fluid. 05/13/18  Yes Turner, Eber Hong, MD  latanoprost (XALATAN) 0.005 % ophthalmic solution Place 1 drop into both eyes at bedtime.   Yes [provider]  lisinopril (ZESTRIL) 40 MG tablet TAKE 1 TABLET BY MOUTH EVERY DAY Patient taking differently: Take 40 mg by mouth daily.  05/30/19  Yes Sueanne Margarita, MD  loperamide (IMODIUM A-D) 2 MG tablet Take 2 mg by mouth daily as needed for diarrhea or loose stools.   Yes [provider]  metoprolol tartrate (LOPRESSOR) 25 MG tablet Take 0.5 tablets (12.5 mg total) by mouth 2 (two) times daily. 08/09/19 10/21/19 Yes Aslam, Sadia, MD  montelukast (SINGULAIR) 10 MG tablet TAKE 1 TABLET BY MOUTH EVERYDAY AT BEDTIME Patient taking differently: Take 10 mg by mouth at bedtime.  05/29/19  Yes Martyn Ehrich, NP  Multiple Vitamins-Minerals (MULTIVITAMIN & MINERAL PO) Take 1 tablet by mouth daily.   Yes [provider]  pilocarpine (PILOCAR) 1 % ophthalmic solution Place 1 drop into both eyes 2 (two) times daily.  12/30/17  Yes [provider]  warfarin (COUMADIN) 2 MG tablet Take 4 mg by mouth daily at 6 PM.   Yes [provider]  warfarin (COUMADIN) 2.5 MG tablet Take 1-1.5 tablets (2.5-3.75 mg total) by mouth daily. Take 7.5mg  (3 tablets) for the next 3 days (today, Saturday and Sunday). On Monday check INR and resume your normal dosing as  noted below.  Take 3.75 mg (1.5 tablets) on Sun, Tues, Weds, Thurs, Sat & Take 2.5 mg (1 tablet) on Mon & Fri Patient not taking: Reported on 10/21/2019 04/23/17   Alma Friendly, MD    Inpatient Medications: Scheduled Meds: . bacitracin   Topical Daily  . citalopram  10 mg Oral Daily  . fluticasone furoate-vilanterol  1 puff Inhalation Daily  . furosemide  20 mg Oral Daily  . latanoprost  1 drop Both Eyes QHS  . metoprolol tartrate  12.5 mg Oral BID  . montelukast  10 mg Oral QHS  . pilocarpine  1 drop Both Eyes BID  . sodium chloride flush  3 mL Intravenous Q12H  . Warfarin - Pharmacist Dosing Inpatient   Does not apply q1600   Continuous Infusions: . cefTRIAXone (ROCEPHIN)  IV 2 g (10/23/19 0821)  . vancomycin 1,000 mg (10/23/19 1001)   PRN Meds: acetaminophen **OR** acetaminophen, albuterol, ALPRAZolam, diphenhydrAMINE, fluticasone, ondansetron **OR** ondansetron (ZOFRAN) IV, Resource ThickenUp Clear  Allergies:    Allergies  Allergen Reactions  . Alphagan [Brimonidine] Other (See Comments)    Burning sensation  . Apraclonidine Other (See Comments)    Pain, brow pain, tender, not able to tolerate  . Biaxin [Clarithromycin] Other (See Comments)    Unknown, per pt   . Brovana [Arformoterol] Other (See Comments)    Makes my heart race  . Budesonide Other (See Comments)    Makes my heart race  . Cefdinir Other (See Comments)    "FEEL HORRIBLE"  . Cephalexin Itching    Pt tolerated Rocephin 08/2019  . Cifenline Itching  . Clonidine Other (See Comments)    unknown  . Clonidine Derivatives Other (See Comments)    REACTION: unknown  . Dorzolamide Other (See Comments)    Redness  . Iodinated Diagnostic Agents Other (See Comments)    unknown  . Iohexol Other (See Comments)     Desc: NOTES FROM PRIOR CT STATES PRE MEDICATION PRIOR TO OMNIPAQUE ENHANCED CT   . Nsaids Other (See Comments)    Unknown  . Penicillins Itching, Swelling and Other (See Comments)     Swelling and edema of ears and rash Swelling to ears  Has patient had a PCN reaction causing immediate rash, facial/tongue/throat swelling, SOB or lightheadedness with hypotension: {no Has patient had a PCN reaction causing severe rash involving mucus membranes or  skin necrosis: {no Has patient had a PCN reaction that required hospitalization no Has patient had a PCN reaction occurring within the last 10 years: no If all of the above answers are "NO", then may proceed with Cephalosporin use.  . Sulfamethoxazole Other (See Comments)    Unknown  . Sulfonamide Derivatives Itching  . Tetracycline Itching and Other (See Comments)    unknown  . Timolol Other (See Comments)    Eye red and irritated   . Vancomycin Other (See Comments)    Turn red  . Zetia [Ezetimibe] Other (See Comments)    Unknown  . Cardizem [Diltiazem Hcl] Rash    Social History:   Social History   Socioeconomic History  . Marital status: Divorced    Spouse name: Not on file  . Number of children: Not on file  . Years of education: Not on file  . Highest education level: Not on file  Occupational History  . 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   Tobacco Use  . Smoking status: Former Smoker    Packs/day: 0.20    Years: 25.00    Pack years: 5.00    Types: Cigarettes    Quit date: 07/06/1966    Years since quitting: 53.3  . Smokeless tobacco: Never Used  Substance and Sexual Activity  . Alcohol use: No    Alcohol/week: 0.0 standard drinks  . Drug use: No  . Sexual activity: Not on file  Other Topics Concern  . Not on file  Social History Narrative   Lives in [place] with her dog.  She is very independent.   Social Determinants of Health   Financial Resource Strain:   . Difficulty of Paying Living Expenses:   Food Insecurity:   . Worried About Charity fundraiser in the Last Year:   . Arboriculturist in the Last Year:   Transportation Needs:   . Lexicographer (Medical):   [name] Lack of Transportation (Non-Medical):   Physical Activity:   . Days of Exercise per Week:   . Minutes of Exercise per Session:   Stress:   . Feeling of Stress :   Social Connections:   . Frequency of Communication with Friends and Family:   . Frequency of Social Gatherings with Friends and Family:   . Attends Religious Services:   . Active Member of Clubs or Organizations:   . Attends Archivist Meetings:   [name] Marital Status:   Intimate Partner Violence:   . Fear of Current or Ex-Partner:   . Emotionally Abused:   [name] Physically Abused:   . Sexually Abused:     Family History:    Family History  Problem Relation Age of Onset  . Heart disease Father   . Hemachromatosis Father   . Heart attack Father   . Colon cancer Mother   . Peripheral vascular disease Mother   . Peripheral vascular disease Sister   . Cancer Sister   . Lymphoma Sister   . Heart attack Brother   . Heart disease Brother      ROS:  Please see the history of present illness.   All other ROS reviewed and negative.     Physical Exam/Data:   Vitals:   10/23/19 1200 10/23/19 1540 10/23/19 1700 10/23/19 1715  BP: (!) 94/56 106/60    Pulse: 84 (!) 106 (!) 101 95  Resp: (!) 22 (!) 32 (!) 23  Temp: (!) 97.1 F (36.2 C) (!) 97.5 F (36.4 C)    TempSrc: Axillary Oral    SpO2: 98% 96%    Weight:      Height:        Intake/Output Summary (Last 24 hours) at 10/23/2019 1754 Last data filed at 10/23/2019 0957 Gross per 24 hour  Intake 3 ml  Output --  Net 3 ml   Last 3 Weights 10/23/2019 10/22/2019 10/21/2019  Weight (lbs) 141 lb 5 oz 138 lb 0.1 oz 138 lb 3.7 oz  Weight (kg) 64.1 kg 62.6 kg 62.7 kg     Body mass index is 23.52 kg/m.  General:  Well nourished, well developed, in no acute distress HEENT: normal Lymph: no adenopathy Neck: no JVD Endocrine:  No thryomegaly Vascular: No carotid bruits; FA pulses 2+ bilaterally without bruits  Cardiac: Irregularly  irregular; no murmur  Lungs:  clear to auscultation bilaterally, no wheezing, rhonchi or rales  Abd: soft, nontender, no hepatomegaly  Ext: no edema Musculoskeletal:  No deformities, BUE and BLE strength normal and equal Skin: warm and dry  Neuro:  CNs 2-12 intact, no focal abnormalities noted Psych:  Normal affect   EKG:  The EKG was personally reviewed and demonstrates: Atrial fibrillation with RVR Telemetry:  Telemetry was personally reviewed and demonstrates: Atrial fibrillation, average heart rate around 100  Relevant CV Studies:  Echo 10/22/2019 1. Left ventricular ejection fraction, by estimation, is 50 to 55%. The  left ventricle has low normal function. The left ventricle has no regional  wall motion abnormalities. There is mild left ventricular hypertrophy.  Left ventricular diastolic  parameters are indeterminate.   2. Right ventricular systolic function is mildly reduced. The right  ventricular size is moderately enlarged. There is moderately elevated  pulmonary artery systolic pressure. The estimated right ventricular  systolic pressure is [id number] mmHg.   3. Left atrial size was severely dilated.   4. Right atrial size was severely dilated.   5. Moderate pleural effusion in the left lateral region.   6. The mitral valve is normal in structure. Trivial mitral valve  regurgitation.   7. The tricuspid valve is abnormal. Tricuspid valve regurgitation is  severe.   8. The aortic valve is tricuspid. Aortic valve regurgitation is not  visualized. Mild to moderate aortic valve sclerosis/calcification is  present, without any evidence of aortic stenosis.   9. Aortic dilatation noted. There is dilatation of the ascending aorta  measuring 42 mm.  10. The inferior vena cava is dilated in size with <50% respiratory  variability, suggesting right atrial pressure of 15 mmHg.   Laboratory Data:  High Sensitivity Troponin:   Recent Labs  Lab 10/21/19 0319 10/21/19 0613    TROPONINIHS 4 6     Chemistry Recent Labs  Lab 10/21/19 0319 10/22/19 0526 10/23/19 0421  NA 134* 137 138  K 3.6 3.7 3.7  CL 98 103 102  CO2 24 26 26   GLUCOSE 143* 69* 91  BUN 26* 22 24*  CREATININE 1.24* 1.02* 1.01*  CALCIUM 8.5* 8.2* 8.5*  GFRNONAA 37* 47* 48*  GFRAA 43* 55* 56*  ANIONGAP 12 8 10     Recent Labs  Lab 10/21/19 0319 10/23/19 0421  PROT 6.7 5.9*  ALBUMIN 3.2* 2.3*  AST 24 24  ALT 16 17  ALKPHOS 62 68  BILITOT 1.0 0.6   Hematology Recent Labs  Lab 10/21/19 0319 10/22/19 0526 10/23/19 0421  WBC 8.4 10.0 10.2  RBC 2.60* 2.38* 2.43*  HGB  8.3* 7.7* 7.9*  HCT 26.1* 23.9* 24.3*  MCV 100.4* 100.4* 100.0  MCH 31.9 32.4 32.5  MCHC 31.8 32.2 32.5  RDW 19.4* 19.4* 19.6*  PLT 121* PLATELET CLUMPS NOTED ON SMEAR, UNABLE TO ESTIMATE PLATELET CLUMPS NOTED ON SMEAR, UNABLE TO ESTIMATE   BNP Recent Labs  Lab 10/21/19 0319  BNP 275.2*    DDimer No results for input(s): DDIMER in the last 168 hours.   Radiology/Studies:  CT Chest Wo Contrast  Addendum Date: 10/21/2019   ADDENDUM REPORT: 10/21/2019 06:14 ADDENDUM: Ascending thoracic aortic aneurysm. Recommend semi-annual imaging followup by CTA or MRA and referral to cardiothoracic surgery if not already obtained. This recommendation follows 2010 ACCF/AHA/AATS/ACR/ASA/SCA/SCAI/SIR/STS/SVM Guidelines for the Diagnosis and Management of Patients With Thoracic Aortic Disease. Circulation. 2010; [id number]. Aortic aneurysm NOS (ICD10-I71.9) Electronically Signed   By: Ulyses Jarred M.D.   On: 10/21/2019 06:14   Result Date: 10/21/2019 CLINICAL DATA:  Pleural effusion EXAM: CT CHEST WITHOUT CONTRAST TECHNIQUE: Multidetector CT imaging of the chest was performed following the standard protocol without IV contrast. COMPARISON:  Chest radiograph 10/21/2019 FINDINGS: Cardiovascular: Calcific aortic atherosclerosis. Ascending thoracic aorta measures 4.5 cm. There is atherosclerotic calcification of the coronary  arteries. Moderate cardiomegaly. Mediastinum/Nodes: No enlarged mediastinal or axillary lymph nodes. Thyroid gland, trachea, and esophagus demonstrate no significant findings. Lungs/Pleura: Intermediate sized pleural effusions. Left basilar atelectasis. Airways patent. Upper Abdomen: Status post cholecystectomy. Musculoskeletal: IMPRESSION: 1. Intermediate sized bilateral pleural effusions with left basilar atelectasis. 2. Coronary artery and. Ascending thoracic aortic aneurysm, measuring up to 4.5 cm. 3. Aortic Atherosclerosis (ICD10-I70.0). Electronically Signed: By: Ulyses Jarred M.D. On: 10/21/2019 04:36   MR HEEL RIGHT WO CONTRAST  Result Date: 10/21/2019 CLINICAL DATA:  Possible calcaneal osteomyelitis. EXAM: MR OF THE RIGHT HEEL WITHOUT CONTRAST TECHNIQUE: Multiplanar, multisequence MR imaging of the ankle was performed. No intravenous contrast was administered. COMPARISON:  None. FINDINGS: Despite efforts by the technologist and patient, motion artifact is present on today's exam and could not be eliminated. This reduces exam sensitivity and specificity. TENDONS Peroneal: Mild common peroneus tenosynovitis. Posteromedial: Unremarkable Anterior: Unremarkable Achilles: Unremarkable Plantar Fascia: Mild thickening of the medial band of the plantar fascia with adjacent edema signal along the superficial margin of the plantar fascia. This may well reflect plantar fasciitis. LIGAMENTS Lateral: Unremarkable Medial: Unremarkable CARTILAGE Ankle Joint: Small posterior joint effusion. Subtalar Joints/Sinus Tarsi: Unremarkable Bones: There is a fracture of the calcaneus extending from the posterior calcaneal body to the posteroinferior margin of the heel as shown on axial images 14-24 of series 6. Along the lower margin of this fracture, there is widening of the fracture plane with accentuated edema, as well as a track of speckled gas densities extending from the fracture plane itself in the subcutaneous tissues  towards the posteromedial cutaneous surface of the ankle is shown on images 21 through 25 of series 6. The appearance is highly suspicious for osteomyelitis complicating the posterior ankle fracture, and it is possible that osteomyelitis predisposed to fracture due to cortical weakening. I do not observe a drainable abscess. Other: Diffuse subcutaneous edema along the the visualized ankle and foot. Cellulitis not excluded. This is more confluent along the posteromedial heel and associated with probable skin ulceration in this vicinity. IMPRESSION: 1. Fracture of the calcaneus extending from the posterior-superior calcaneal body to the posteroinferior margin of the heel. There is widening of the fracture plane with accentuated edema signal along the lower margin of this fracture, and a track of speckled gas densities  extending from the fracture plane itself towards the posteromedial cutaneous surface of the ankle. The appearance is highly suspicious for osteomyelitis, and it is possible that osteomyelitis predisposed to the fracture due to cortical weakening. 2. Diffuse subcutaneous edema along the visualized ankle and foot. Cellulitis not excluded. 3. Mild common peroneus tenosynovitis. 4. Mild thickening of the medial band of the plantar fascia with adjacent edema signal, may reflect plantar fasciitis. 5. Small posterior joint effusion at the tibiotalar joint. 6. Despite efforts by the technologist and patient, motion artifact is present on today's exam and could not be eliminated. This reduces exam sensitivity and specificity. Electronically Signed   By: Van Clines M.D.   On: 10/21/2019 13:48   DG Chest Portable 1 View  Result Date: 10/21/2019 CLINICAL DATA:  Shortness of breath EXAM: PORTABLE CHEST 1 VIEW COMPARISON:  08/08/2019 FINDINGS: Small left pleural effusion, slightly increased in size. Left basilar atelectasis. Cardiomediastinal contours are normal. There is calcific aortic atherosclerosis.  Right lung is clear. IMPRESSION: Slight increase in size of small left pleural effusion with associated atelectasis. Electronically Signed   By: Ulyses Jarred M.D.   On: 10/21/2019 03:04   DG Foot Complete Left  Result Date: 10/23/2019 CLINICAL DATA:  86 year old female with ulceration of the left foot. EXAM: LEFT FOOT - COMPLETE 3+ VIEW COMPARISON:  None. FINDINGS: There is no acute fracture or dislocation. Prior pinning of the second and third metatarsal heads with chronic fracture and nonunion of the second metatarsal head adjacent to the surgical pain. The surgical pain of the second metatarsal extends outside of the bone and does not fix the fracture. There is moderate degenerative changes of the first MTP joint with mild hallux valgus. The bones are osteopenic. Mild diffuse subcutaneous edema. No radiopaque foreign object or soft tissue gas. IMPRESSION: 1. No acute fracture or dislocation. 2. Prior pinning of the second and third metatarsal heads with chronic fracture and nonunion of the second metatarsal head. Electronically Signed   By: Anner Crete M.D.   On: 10/23/2019 15:26   DG Foot Complete Right  Result Date: 10/21/2019 CLINICAL DATA:  Evaluate for osteomyelitis EXAM: RIGHT FOOT COMPLETE - 3+ VIEW COMPARISON:  None. FINDINGS: Osteopenia. Hallux valgus deformity with secondary osteoarthritis of the first metatarsophalangeal joint. Suboptimal patient positioning. Diffuse soft tissue swelling. Soft tissue irregularity about the heel with suspicion of cortical irregularity and osteolysis within the subjacent calcaneus. Small Achilles and calcaneal spurs. IMPRESSION: 1. Soft tissue irregularity about the heel with underlying cortical irregularity and possible osteolysis. Findings are suspicious for osteomyelitis. 2. Hallux valgus deformity with secondary osteoarthritis. Electronically Signed   By: Abigail Miyamoto M.D.   On: 10/21/2019 06:00   DG Swallowing Func-Speech Pathology  Result Date:  10/23/2019 Objective Swallowing Evaluation: Type of Study: MBS-Modified Barium Swallow Study  Patient Details Name: THOMASENIA DELASHMUTT MRN: [id number] Date of Birth: [birth year] Today's Date: 10/23/2019 Time: SLP Start Time (ACUTE ONLY): 0850 -SLP Stop Time (ACUTE ONLY): 0920 SLP Time Calculation (min) (ACUTE ONLY): 30 min Past Medical History: Past Medical History: Diagnosis Date . Anxiety  . Arthritis  . CAD (coronary artery disease) 2007  a. 1997 s/p PCI of diagonal. . Constipation  . COPD/Asthma  . Diverticulosis  . Esophageal reflux  . Family history of adverse reaction to anesthesia   daughter had trouble breathing after surgery, had to be reintubated . Glaucoma  . HTN (hypertension), benign  . Hyperlipidemia  . Malignant melanoma ([place])   a. 09/2012 s/p resection. [name]  Neuropathy   lower legs . PAF (paroxysmal atrial fibrillation) (HCC)   a. intolerant to beta blockers due to asthma and intolerant to CCB due to rash;  b. CHA2DS2VASc = 6-->chronic coumadin;  c. 03/2014 Echo: EF 60-65%, Gr 1 DD, triv AI, mildly dil RV, PASP 46mmhg. [name] TIA (transient ischemic attack)   a. 02/2011 - chronic coumadin in setting of PAF. [name] Urinary, incontinence, stress female  Past Surgical History: Past Surgical History: Procedure Laterality Date . ABDOMINAL HYSTERECTOMY   . APPENDECTOMY   . BACK SURGERY    lumbar laminectomy . CHOLECYSTECTOMY   . COLONOSCOPY   . CORONARY ANGIOPLASTY WITH STENT PLACEMENT   . EYE SURGERY   . NASAL SINUS SURGERY   . VESICOVAGINAL FISTULA CLOSURE W/ TAH   . WEIL OSTEOTOMY Left 06/12/2016  Procedure: Left Chevron and Aiken Left [name], Weil Osteotomy 2nd and 3rd Metatarsal;  Surgeon: Newt Minion, MD;  Location: [facility];  Service: Orthopedics;  Laterality: Left; HPI: Pt is a 86 y.o. female with medical history significant of HTN, HLD, persistent atrial fibrillation on Coumadin, CAD s/p stent, COPD/asthma, and anxiety who presented with complaints of shortness of breath. CT chest: Intermediate sized bilateral  pleural effusions with left basilar atelectasis. Pt was noted to have "issues" swallowing pills in ED.  No data recorded Assessment / Plan / Recommendation CHL IP CLINICAL IMPRESSIONS 10/23/2019 Clinical Impression Pt demonstrates moderate pharyngeal dysphagia with silent aspriation of thin and nectar thick bolus during the swallow due to slightly late/incompletel aryngeal closure. A chin tuck was not beneficial in preventing aspiration, but controlling bolus size with spoon or small cups sips instead of straws did not result in aspiration with nectar thick liquids. Teaching a supra glottic swallow or supersupraglottic swallow are potential areas to investigate in therapy if warranted, though strategy was too comlpex for initial attempts in [facility] suite. Recommend pt initaite a dys 3 mech soft diet with nectar thick liquids, no straw. Will need further discussion with pt and family regarding wishes and long term plan. There is only slightly decreased risk of aspiration with nectar and pt may wish to consume thin liquids accepting risk.  SLP Visit Diagnosis Dysphagia, pharyngeal phase (R13.13) Attention and concentration deficit following -- Frontal lobe and executive function deficit following -- Impact on safety and function Moderate aspiration risk   CHL IP TREATMENT RECOMMENDATION 10/23/2019 Treatment Recommendations F/U MBS in --- days (Comment)   Prognosis 10/23/2019 Prognosis for Safe Diet Advancement Good Barriers to Reach Goals -- Barriers/Prognosis Comment -- CHL IP DIET RECOMMENDATION 10/23/2019 SLP Diet Recommendations Dysphagia 3 (Mech soft) solids;Nectar thick liquid Liquid Administration via No straw;Cup;Spoon Medication Administration Whole meds with puree Compensations Small sips/bites;Slow rate Postural Changes Seated upright at 90 degrees   CHL IP OTHER RECOMMENDATIONS 10/23/2019 Recommended Consults -- Oral Care Recommendations Oral care BID Other Recommendations Order thickener from pharmacy   CHL IP  FOLLOW UP RECOMMENDATIONS 10/23/2019 Follow up Recommendations Other (comment)   CHL IP FREQUENCY AND DURATION 10/23/2019 Speech Therapy Frequency (ACUTE ONLY) min 2x/week Treatment Duration 2 weeks      CHL IP ORAL PHASE 10/23/2019 Oral Phase WFL Oral - Pudding Teaspoon -- Oral - Pudding Cup -- Oral - Honey Teaspoon -- Oral - Honey Cup -- Oral - Nectar Teaspoon -- Oral - Nectar Cup -- Oral - Nectar Straw -- Oral - Thin Teaspoon -- Oral - Thin Cup -- Oral - Thin Straw -- Oral - Puree -- Oral - Mech Soft -- Oral -  Regular -- Oral - Multi-Consistency -- Oral - Pill -- Oral Phase - Comment --  CHL IP PHARYNGEAL PHASE 10/23/2019 Pharyngeal Phase Impaired Pharyngeal- Pudding Teaspoon -- Pharyngeal -- Pharyngeal- Pudding Cup -- Pharyngeal -- Pharyngeal- Honey Teaspoon -- Pharyngeal -- Pharyngeal- Honey Cup -- Pharyngeal -- Pharyngeal- Nectar Teaspoon Pharyngeal residue - pyriform Pharyngeal -- Pharyngeal- Nectar Cup Pharyngeal residue - pyriform;WFL Pharyngeal -- Pharyngeal- Nectar Straw Penetration/Aspiration during swallow;Trace aspiration;Reduced airway/laryngeal closure Pharyngeal Material enters airway, passes BELOW cords without attempt by patient to eject out (silent aspiration) Pharyngeal- Thin Teaspoon Penetration/Aspiration during swallow;Penetration/Aspiration before swallow;Reduced airway/laryngeal closure;Trace aspiration Pharyngeal Material enters airway, passes BELOW cords without attempt by patient to eject out (silent aspiration) Pharyngeal- Thin Cup Penetration/Aspiration during swallow;Reduced airway/laryngeal closure;Trace aspiration Pharyngeal Material enters airway, passes BELOW cords without attempt by patient to eject out (silent aspiration) Pharyngeal- Thin Straw -- Pharyngeal -- Pharyngeal- Puree WFL Pharyngeal -- Pharyngeal- Mechanical Soft WFL Pharyngeal -- Pharyngeal- Regular -- Pharyngeal -- Pharyngeal- Multi-consistency -- Pharyngeal -- Pharyngeal- Pill -- Pharyngeal -- Pharyngeal Comment --  No  flowsheet data found. DeBlois, Katherene Ponto 10/23/2019, 2:58 PM              ECHOCARDIOGRAM COMPLETE  Result Date: 10/22/2019    ECHOCARDIOGRAM REPORT   Patient Name:   CATHLIN WACHTLER Date of Exam: 10/22/2019 Medical Rec #:  [id number]              Height:       65.0 in Accession #:    [id number]             Weight:       138.0 lb Date of Birth:  05-04-1924              BSA:          1.690 m Patient Age:    28 years               BP:           102/67 mmHg Patient Gender: F                      HR:           88 bpm. Exam Location:  Inpatient Procedure: 2D Echo, Cardiac Doppler and Color Doppler Indications:    [id number] Acute diastolic (congestive) heart failure  History:        Patient has prior history of Echocardiogram examinations, most                 recent 03/06/2015. Osteomyelitis.  Sonographer:    Merrie Roof RDCS Referring Phys: [id number] [place]  1. Left ventricular ejection fraction, by estimation, is 50 to 55%. The left ventricle has low normal function. The left ventricle has no regional wall motion abnormalities. There is mild left ventricular hypertrophy. Left ventricular diastolic parameters are indeterminate.  2. Right ventricular systolic function is mildly reduced. The right ventricular size is moderately enlarged. There is moderately elevated pulmonary artery systolic pressure. The estimated right ventricular systolic pressure is [id number] mmHg.  3. Left atrial size was severely dilated.  4. Right atrial size was severely dilated.  5. Moderate pleural effusion in the left lateral region.  6. The mitral valve is normal in structure. Trivial mitral valve regurgitation.  7. The tricuspid valve is abnormal. Tricuspid valve regurgitation is severe.  8. The aortic valve is tricuspid. Aortic valve regurgitation is not visualized. Mild to moderate aortic valve sclerosis/calcification is present,  without any evidence of aortic stenosis.  9. Aortic dilatation noted. There is dilatation  of the ascending aorta measuring 42 mm. 10. The inferior vena cava is dilated in size with <50% respiratory variability, suggesting right atrial pressure of 15 mmHg. FINDINGS  Left Ventricle: Left ventricular ejection fraction, by estimation, is 50 to 55%. The left ventricle has low normal function. The left ventricle has no regional wall motion abnormalities. The left ventricular internal cavity size was normal in size. There is mild left ventricular hypertrophy. Left ventricular diastolic parameters are indeterminate. Right Ventricle: The right ventricular size is moderately enlarged. Right vetricular wall thickness was not assessed. Right ventricular systolic function is mildly reduced. There is moderately elevated pulmonary artery systolic pressure. The tricuspid regurgitant velocity is 3.17 m/s, and with an assumed right atrial pressure of 15 mmHg, the estimated right ventricular systolic pressure is [id number] mmHg. Left Atrium: Left atrial size was severely dilated. Right Atrium: Right atrial size was severely dilated. Pericardium: Trivial pericardial effusion is present. Mitral Valve: The mitral valve is normal in structure. Trivial mitral valve regurgitation. Tricuspid Valve: The tricuspid valve is abnormal. Tricuspid valve regurgitation is severe. Aortic Valve: The aortic valve is tricuspid. Aortic valve regurgitation is not visualized. Mild to moderate aortic valve sclerosis/calcification is present, without any evidence of aortic stenosis. Pulmonic Valve: The pulmonic valve was not well visualized. Pulmonic valve regurgitation is not visualized. Aorta: Aortic dilatation noted. There is dilatation of the ascending aorta measuring 42 mm. Venous: The inferior vena cava is dilated in size with less than 50% respiratory variability, suggesting right atrial pressure of 15 mmHg. IAS/Shunts: No atrial level shunt detected by color flow Doppler. Additional Comments: There is a moderate pleural effusion in the left  lateral region.  LEFT VENTRICLE PLAX 2D LVIDd:         4.10 cm LVIDs:         3.20 cm LV PW:         1.10 cm LV IVS:        1.10 cm LVOT diam:     1.80 cm LV SV:         45 LV SV Index:   27 LVOT Area:     2.54 cm  LV Volumes (MOD) LV vol d, MOD A4C: 43.2 ml LV vol s, MOD A4C: 16.6 ml LV SV MOD A4C:     43.2 ml RIGHT VENTRICLE             IVC RV S prime:     10.60 cm/s  IVC diam: 2.70 cm TAPSE (M-mode): 1.7 cm LEFT ATRIUM             Index       RIGHT ATRIUM           Index LA diam:        4.10 cm 2.43 cm/m  RA Area:     28.20 cm LA Vol (A2C):   94.6 ml 55.99 ml/m RA Volume:   95.20 ml  56.35 ml/m LA Vol (A4C):   71.9 ml 42.56 ml/m LA Biplane Vol: 91.3 ml 54.04 ml/m  AORTIC VALVE LVOT Vmax:   94.20 cm/s LVOT Vmean:  63.100 cm/s LVOT VTI:    0.178 m  AORTA Ao Root diam: 3.50 cm Ao Asc diam:  4.20 cm TRICUSPID VALVE TR Peak grad:   40.2 mmHg TR Vmax:        317.00 cm/s  SHUNTS Systemic VTI:  0.18 m Systemic Diam: 1.80 cm Harrell Gave  Gardiner Rhyme MD Electronically signed by Oswaldo Milian MD Signature Date/Time: 10/22/2019/7:27:27 PM    Final    VAS [place] LOWER EXTREMITY VENOUS (DVT) (ONLY [facility] & [facility])  Result Date: 10/21/2019  Lower Venous DVTStudy Indications: Swelling, and ulceration.  Limitations: Clothing, confusion, and movement. Comparison Study: Prior study from 09/28/14 is available for comparison Performing Technologist: Sharion Dove RVS  Examination Guidelines: A complete evaluation includes B-mode imaging, spectral Doppler, color Doppler, and power Doppler as needed of all accessible portions of each vessel. Bilateral testing is considered an integral part of a complete examination. Limited examinations for reoccurring indications may be performed as noted. The reflux portion of the exam is performed with the patient in reverse Trendelenburg.  +---------+---------------+---------+-----------+----------+--------------+ RIGHT    CompressibilityPhasicitySpontaneityPropertiesThrombus Aging  +---------+---------------+---------+-----------+----------+--------------+ CFV      Full           Yes      Yes                                 +---------+---------------+---------+-----------+----------+--------------+ SFJ      Full                                                        +---------+---------------+---------+-----------+----------+--------------+ FV Prox  Full                                                        +---------+---------------+---------+-----------+----------+--------------+ FV Mid   Full                                                        +---------+---------------+---------+-----------+----------+--------------+ FV DistalFull                                                        +---------+---------------+---------+-----------+----------+--------------+ PFV      Full                                                        +---------+---------------+---------+-----------+----------+--------------+ POP      Full           Yes      Yes                                 +---------+---------------+---------+-----------+----------+--------------+ PTV      Full                                                        +---------+---------------+---------+-----------+----------+--------------+  PERO     Full                                                        +---------+---------------+---------+-----------+----------+--------------+   Left Technical Findings: Left leg not evaluated.   Summary: RIGHT: - Findings appear essentially unchanged compared to previous examination. - There is no evidence of deep vein thrombosis in the lower extremity.   *See table(s) above for measurements and observations. Electronically signed by Monica Martinez MD on 10/21/2019 at 3:11:05 PM.    Final    {  Assessment and Plan:   1. Preoperative clearance:   -Consulted in the setting of right heel osteomyelitis and a soft tissue cellulitis.   Currently debating between antibiotic suppression therapy versus amputation therapy  -Patient is overall cardiac risk include advanced age, CAD, and uncontrolled atrial fibrillation.  Fortunately, her ejection fraction seems to be stable with EF of 50 to 55% on echocardiogram during this admission  -Prior to February hospitalization, she has been able to walk around at home and take care of herself without any issue.  She lives by herself as well.  -Given her overall cardiac history and her advanced age, she likely will be at least moderate if not high risk if amputation is required.  Will discuss with MD.   2. Right calcaneus fracture with osteomyelitis and lower extremity soft tissue cellulitis: She was admitted for cellulitis of the left lower extremity in February and was treated with a course of antibiotic.  Now she is presenting with right lower extremity soft tissue cellulitis and right heel osteomyelitis.  Orthopedic surgery has been consulted and is discussing with the family between conservative antibiotic suppression therapy versus more aggressive amputation therapy.  3. Acute diastolic heart failure: In the setting of uncontrolled heart rate during atrial fibrillation  -He received IV diuretic in the ED, currently appears to be euvolemic on exam.   4. Permanent atrial fibrillation on Coumadin:   -During the recent hospitalization, her heart rate on arrival usually was between 100-140s range, this is likely related to medication noncompliance.  Family member says they often find medications in the floor at home.  -Consider consolidate metoprolol tartrate to metoprolol succinate for better compliance.  -The other problem with her atrial fibrillation is recurrent supratherapeutic INR.  During the recent 2 hospitalization, her INR was greater than 10 on arrival each time.  Interestingly enough, family mentions they have taken her to her PCP between the 2 hospitalization and her INR was good.   Question if is related to dietary intake or possibly related to antibiotic received during the hospitalization.  However daughter may only mentions cephalexin for 2 days in between the 2 hospitalization.  At some point, if her INR remains labile, risk associated with Coumadin may outweigh the benefit and will need to consider discontinuing the Coumadin and to tolerate higher chance of stroke.  5. Hypertension: Blood pressure borderline at this time secondary to SIRS and infection.  Home lisinopril 40 mg and amlodipine 10 mg have been discontinued.  On low-dose metoprolol 12.5 mg twice daily for rate control.  Note, when she was seen by Dr. Radford Pax last year, she was actually on 25 mg twice daily of metoprolol.  At this time, her blood pressure is too soft for me to uptitrate beta-blocker dosage.  May use digoxin as needed on the side to help control her heart rate during this admission.  I would not recommend digoxin going home especially given her questionable compliance at home and a recurrent episode of supratherapeutic INR.  She would be at high risk for digoxin toxicity if she does take a scheduled medication such as digoxin.  6. Hyperlipidemia  7. COPD  8. AAA: CT of the chest obtained on 4/17 showed ascending thoracic aortic aneurysm measuring up to 4.5 cm.  Hilbert Corrigan, [place]  10/23/2019 5:54 PM  Pt seen and examined   I agree with findings as noted by Janan Ridge above   Briefly, pt is a 86 yo with known CAD (remote intervention in 1997; cant remember symptoms prior), permanent afib (rate control/anticoagulation), HL, HTN.  Prior to Feb 2021 was ambulating, caring for self.   She is now admitted for Rx of LE cellulitis  Being evaluated for possible amputation.  Currently she denies CP   Breathing OK ON exam she is a frail 86 yo in NAD NEck:   JVP is not elevated Lungs ar relatively clear    Cardiac exam:  Irreg irreg  No S3   No signif murmurs Abd is benign    Ext are without edema  Mild  erythema     Tele:  Afib   Rates 100s   EKG   Extensive motion artifact   Afib with RVR   130s Echo:   LVEF 50 to 55%  1  Preop risk stratification.    Pt with known CAD, diastolic CHF and chronic atrial fibrillation.   Given her medical Hx and her age, if antibiotic therapy is not adequate and amputation is recommended,  she is moderate, possibly high, risk for major cardiac complication.  But risk  is not prohibitive.. I would not recommend further cardiac testing as part of preop work up as I would not recommend proceeding with invasive Rx.   Would follow BP and HR closely  Watch I/O closely.   Watch for blood loss.  Other recommendations as noted above   Will continue to follow  Dorris Carnes MD

## 2019-10-23 NOTE — Progress Notes (Signed)
Patient returned back from swallow study

## 2019-10-23 NOTE — Progress Notes (Signed)
Patient is off of the floor for swallow study

## 2019-10-23 NOTE — Progress Notes (Addendum)
PROGRESS NOTE        PATIENT DETAILS Name: [name] Age: 86 y.o. Sex: female Date of Birth: 12-05-1923 Admit Date: 10/21/2019 Admitting Physician Norval Morton, MD [id number], Lenna Sciara, PA-C  Brief Narrative: Patient is a 86 y.o. female with history of chronic atrial fibrillation-on Coumadin, chronic diastolic heart failure, CAD history of remote PCI, COPD/asthma overlap presented with shortness of breath-thought to be secondary to decompensated diastolic heart failure, A. fib with RVR-and also found to have osteomyelitis of the right calcaneus.  See below for further details.  Significant events: 4/17>> admit to [facility]-osteomyelitis of right calcaneus, A. fib with RVR, decompensated heart failure and coagulopathy 4/18>> TTE-EF [id number], RV systolic function mildly reduced-RVSP 55.2  Antimicrobial therapy: Vancomycin 4/17>> Rocephin 4/17>>  Microbiology data: 4/7>> blood culture: Negative  Procedures : None  Consults: Orthopedics  DVT Prophylaxis : Supratherapeutic INR-Coumadin on hold  Subjective: Right leg less erythematous and swollen today.  Patient is leaning towards a BKA-great granddaughter at bedside.  Assessment/Plan: SIRS secondary to right lower extremity soft tissue infection with osteomyelitis and resultant pathological fracture of the right calcaneus: SIRS physiology has resolved.  Right leg less swollen and erythematous today.  Patient and family discussion in progress-they are leaning towards a BKA.  Family/patient aware of the risk of smoldering infection, metastatic infection, adverse effects of prolonged antimicrobial therapy was-if they pursue medical therapy.  Although she will be at some risk from the surgical procedure-I do not see anything prohibitive.  At the request of family-I have asked cardiology to evaluate for perioperative risk.    Chronic atrial fibrillation with RVR: Heart rate better controlled-developed  transient hypotension post admission-tolerating metoprolol well.  If BP becomes an issue-we can consider adding digoxin or amiodarone.   Acute hypoxic respiratory failure secondary to decompensated diastolic heart failure: Volume status proved-BP soft-continue Lasix 20 mg today.  Will reassess tomorrow.   Supratherapeutic INR: Secondary to Coumadin use-no evidence of bleeding-Coumadin on hold-we will allow it to trend down-unless orthopedics planning surgical intervention sooner-then will give vitamin K/FFP accordingly.  AKI: Improved-likely hemodynamically mediated  Transient hypotension: Likely secondary to medication use-monitor for now.  BP now stable.  Pulmonary hypertension: Likely secondary to COPD.  Doubt further work-up/invasive therapy will change management or outcome in this frail elderly patient.   COPD/asthma overlap: No wheezing-stable-continue bronchodilators  Anemia: Suspect has chronic anemia due to osteomyelitis/chronic inflammation-worsened by acute illness.  No indication for transfusion-follow for now.  Anxiety: Appears stable-continue as needed Ativan  Dysphagia: Chronic issue-await SLP eval.  4.5 cm ascending aortic aneurysm seen on CT chest: Stable for outpatient follow-up  Diet: Diet Order            DIET DYS 3 Room service appropriate? No; Fluid consistency: Nectar Thick  Diet effective now               Code Status:  DNR  Family Communication: Great granddaughter at bedside  Disposition Plan:  Home health vs SNF when ready for discharge  Barriers to Discharge: Right leg soft tissue infection with osteomyelitis requiring IV antibiotics along with decompensated heart failure.    Antimicrobial agents: Anti-infectives (From admission, onward)   Start     Dose/Rate Route Frequency Ordered Stop   10/23/19 0800  vancomycin (VANCOCIN) IVPB 1000 mg/200 mL premix     1,000 mg 100 mL/hr over 120  Minutes Intravenous Every 48 hours 10/21/19 0652      10/22/19 0800  cefTRIAXone (ROCEPHIN) 2 g in sodium chloride 0.9 % 100 mL IVPB     2 g 200 mL/hr over 30 Minutes Intravenous Every 24 hours 10/21/19 0647     10/21/19 0700  vancomycin (VANCOREADY) IVPB 1250 mg/250 mL     1,250 mg 83.3 mL/hr over 180 Minutes Intravenous  Once 10/21/19 0630 10/21/19 1140   10/21/19 0700  cefTRIAXone (ROCEPHIN) 1 g in sodium chloride 0.9 % 100 mL IVPB     1 g 200 mL/hr over 30 Minutes Intravenous  Once 10/21/19 0647 10/21/19 1225   10/21/19 0515  cefTRIAXone (ROCEPHIN) 1 g in sodium chloride 0.9 % 100 mL IVPB     1 g 200 mL/hr over 30 Minutes Intravenous  Once 10/21/19 0501 10/21/19 0646       Time spent: 25 minutes-Greater than 50% of this time was spent in counseling, explanation of diagnosis, planning of further management, and coordination of care.  MEDICATIONS: Scheduled Meds: . bacitracin   Topical Daily  . citalopram  10 mg Oral Daily  . fluticasone furoate-vilanterol  1 puff Inhalation Daily  . furosemide  20 mg Oral Daily  . latanoprost  1 drop Both Eyes QHS  . metoprolol tartrate  12.5 mg Oral BID  . montelukast  10 mg Oral QHS  . pilocarpine  1 drop Both Eyes BID  . sodium chloride flush  3 mL Intravenous Q12H  . Warfarin - Pharmacist Dosing Inpatient   Does not apply q1600   Continuous Infusions: . cefTRIAXone (ROCEPHIN)  IV 2 g (10/23/19 0821)  . vancomycin 1,000 mg (10/23/19 1001)   PRN Meds:.acetaminophen **OR** acetaminophen, albuterol, ALPRAZolam, diphenhydrAMINE, fluticasone, ondansetron **OR** ondansetron (ZOFRAN) IV, Resource ThickenUp Clear   PHYSICAL EXAM: Vital signs: Vitals:   10/23/19 0652 10/23/19 0734 10/23/19 0800 10/23/19 1200  BP:  (!) 92/50 96/64 (!) 94/56  Pulse: 90 82 89 84  Resp: 15 20 (!) 25 (!) 22  Temp:  98.8 F (37.1 C)  (!) 97.1 F (36.2 C)  TempSrc:  Axillary  Axillary  SpO2: 98% 98% 98% 98%  Weight:      Height:       Filed Weights   10/21/19 2055 10/22/19 0547 10/23/19 0340  Weight: 62.7  kg 62.6 kg 64.1 kg   Body mass index is 23.52 kg/m.   Gen Exam:Alert awake-not in any distress HEENT:atraumatic, normocephalic Chest: B/L clear to auscultation anteriorly CVS:S1S2 irregular Abdomen:soft non tender, non distended Extremities: Less swelling/erythema of the right lower extremity Neurology: Non focal Skin: no rash  I have personally reviewed following labs and imaging studies  LABORATORY DATA: CBC: Recent Labs  Lab 10/21/19 0319 10/22/19 0526 10/23/19 0421  WBC 8.4 10.0 10.2  NEUTROABS 8.0*  --   --   HGB 8.3* 7.7* 7.9*  HCT 26.1* 23.9* 24.3*  MCV 100.4* 100.4* 100.0  PLT 121* PLATELET CLUMPS NOTED ON SMEAR, UNABLE TO ESTIMATE PLATELET CLUMPS NOTED ON SMEAR, UNABLE TO ESTIMATE    Basic Metabolic Panel: Recent Labs  Lab 10/21/19 0319 10/22/19 0526 10/23/19 0421  NA 134* 137 138  K 3.6 3.7 3.7  CL 98 103 102  CO2 24 26 26   GLUCOSE 143* 69* 91  BUN 26* 22 24*  CREATININE 1.24* 1.02* 1.01*  CALCIUM 8.5* 8.2* 8.5*  MG  --  1.8 2.2    GFR: Estimated Creatinine Clearance: 31.3 mL/min (A) (by C-G formula based on SCr of  1.01 mg/dL (H)).  Liver Function Tests: Recent Labs  Lab 10/21/19 0319 10/23/19 0421  AST 24 24  ALT 16 17  ALKPHOS 62 68  BILITOT 1.0 0.6  PROT 6.7 5.9*  ALBUMIN 3.2* 2.3*   No results for input(s): LIPASE, AMYLASE in the last 168 hours. No results for input(s): AMMONIA in the last 168 hours.  Coagulation Profile: Recent Labs  Lab 10/21/19 0319 10/22/19 0526 10/23/19 0421  INR 9.3* 8.2* 6.6*    Cardiac Enzymes: No results for input(s): CKTOTAL, CKMB, CKMBINDEX, TROPONINI in the last 168 hours.  BNP (last 3 results) No results for input(s): PROBNP in the last 8760 hours.  Lipid Profile: No results for input(s): CHOL, HDL, LDLCALC, TRIG, CHOLHDL, LDLDIRECT in the last 72 hours.  Thyroid Function Tests: No results for input(s): TSH, T4TOTAL, FREET4, T3FREE, THYROIDAB in the last 72 hours.  Anemia Panel: Recent  Labs    10/22/19 0526  VITAMINB12 1,330*    Urine analysis:    Component Value Date/Time   COLORURINE YELLOW 04/05/2018 1344   APPEARANCEUR CLEAR 04/05/2018 1344   LABSPEC 1.009 04/05/2018 1344   PHURINE 6.0 04/05/2018 1344   GLUCOSEU NEGATIVE 04/05/2018 1344   HGBUR NEGATIVE 04/05/2018 1344   BILIRUBINUR NEGATIVE 04/05/2018 1344   KETONESUR NEGATIVE 04/05/2018 1344   PROTEINUR NEGATIVE 04/05/2018 1344   UROBILINOGEN 0.2 03/05/2015 1600   NITRITE NEGATIVE 04/05/2018 1344   LEUKOCYTESUR NEGATIVE 04/05/2018 1344    Sepsis Labs: Lactic Acid, Venous    Component Value Date/Time   LATICACIDVEN 0.9 10/21/2019 2111    MICROBIOLOGY: Recent Results (from the past 240 hour(s))  Blood culture (routine x 2)     Status: None (Preliminary result)   Collection Time: 10/21/19  6:13 AM   Specimen: BLOOD  Result Value Ref Range Status   Specimen Description BLOOD RIGHT ANTECUBITAL  Final   Special Requests   Final    BOTTLES DRAWN AEROBIC AND ANAEROBIC Blood Culture results may not be optimal due to an excessive volume of blood received in culture bottles   Culture   Final    NO GROWTH 2 DAYS Performed at [facility], [place] [address], [place], [place] [address]    Report Status PENDING  Incomplete  Blood culture (routine x 2)     Status: None (Preliminary result)   Collection Time: 10/21/19  6:13 AM   Specimen: BLOOD RIGHT WRIST  Result Value Ref Range Status   Specimen Description BLOOD RIGHT WRIST  Final   Special Requests   Final    BOTTLES DRAWN AEROBIC AND ANAEROBIC Blood Culture adequate volume   Culture   Final    NO GROWTH 2 DAYS Performed at [facility], [place] [address], [place], [place] [address]    Report Status PENDING  Incomplete  SARS CORONAVIRUS 2 (TAT 6-24 HRS) Nasopharyngeal Nasopharyngeal Swab     Status: None   Collection Time: 10/21/19  7:30 AM   Specimen: Nasopharyngeal Swab  Result Value Ref Range Status   SARS Coronavirus 2 NEGATIVE  NEGATIVE Final    Comment: (NOTE) SARS-CoV-2 target nucleic acids are NOT DETECTED. The SARS-CoV-2 RNA is generally detectable in upper and lower respiratory specimens during the acute phase of infection. Negative results do not preclude SARS-CoV-2 infection, do not rule out co-infections with other pathogens, and should not be used as the sole basis for treatment or other patient management decisions. Negative results must be combined with clinical observations, patient history, and epidemiological information. The  expected result is Negative. Fact Sheet for Patients: SugarRoll.be Fact Sheet for Healthcare Providers: https://www.woods-mathews.com/ This test is not yet approved or cleared by the [place] FDA and  has been authorized for detection and/or diagnosis of SARS-CoV-2 by FDA under an Emergency Use Authorization (EUA). This EUA will remain  in effect (meaning this test can be used) for the duration of the COVID-19 declaration under Section 56 4(b)(1) of the Act, 21 U.S.C. section 360bbb-3(b)(1), unless the authorization is terminated or revoked sooner. Performed at [facility], [place] [address], [place], [place] [address]     RADIOLOGY STUDIES/RESULTS: DG Swallowing Func-Speech Pathology  Result Date: 10/23/2019 Objective Swallowing Evaluation: Type of Study: MBS-Modified Barium Swallow Study  Patient Details Name: [name] MRN: [id number] Date of Birth: Jan 03, 1924 Today's Date: 10/23/2019 Time: SLP Start Time (ACUTE ONLY): 0850 -SLP Stop Time (ACUTE ONLY): 0920 SLP Time Calculation (min) (ACUTE ONLY): 30 min Past Medical History: Past Medical History: Diagnosis Date . Anxiety  . Arthritis  . CAD (coronary artery disease) 2007  a. 1997 s/p PCI of diagonal. . Constipation  . COPD/Asthma  . Diverticulosis  . Esophageal reflux  . Family history of adverse reaction to anesthesia   daughter had trouble breathing after  surgery, had to be reintubated . Glaucoma  . HTN (hypertension), benign  . Hyperlipidemia  . Malignant melanoma ([place])   a. 09/2012 s/p resection. . Neuropathy   lower legs . PAF (paroxysmal atrial fibrillation) ([facility])   a. intolerant to beta blockers due to asthma and intolerant to CCB due to rash;  b. CHA2DS2VASc = 6-->chronic coumadin;  c. 03/2014 Echo: EF 60-65%, Gr 1 DD, triv AI, mildly dil RV, PASP 18mmhg. [name] TIA (transient ischemic attack)   a. 02/2011 - chronic coumadin in setting of PAF. [name] Urinary, incontinence, stress female  Past Surgical History: Past Surgical History: Procedure Laterality Date . ABDOMINAL HYSTERECTOMY   . APPENDECTOMY   . BACK SURGERY    lumbar laminectomy . CHOLECYSTECTOMY   . COLONOSCOPY   . CORONARY ANGIOPLASTY WITH STENT PLACEMENT   . EYE SURGERY   . NASAL SINUS SURGERY   . VESICOVAGINAL FISTULA CLOSURE W/ TAH   . WEIL OSTEOTOMY Left 06/12/2016  Procedure: Left Chevron and Aiken Left [name], Weil Osteotomy 2nd and 3rd Metatarsal;  Surgeon: Newt Minion, MD;  Location: [place];  Service: Orthopedics;  Laterality: Left; HPI: Pt is a 86 y.o. female with medical history significant of HTN, HLD, persistent atrial fibrillation on Coumadin, CAD s/p stent, COPD/asthma, and anxiety who presented with complaints of shortness of breath. CT chest: Intermediate sized bilateral pleural effusions with left basilar atelectasis. Pt was noted to have "issues" swallowing pills in ED.  No data recorded Assessment / Plan / Recommendation CHL IP CLINICAL IMPRESSIONS 10/23/2019 Clinical Impression Pt demonstrates moderate pharyngeal dysphagia with silent aspriation of thin and nectar thick bolus during the swallow due to slightly late/incompletel aryngeal closure. A chin tuck was not beneficial in preventing aspiration, but controlling bolus size with spoon or small cups sips instead of straws did not result in aspiration with nectar thick liquids. Teaching a supra glottic swallow or supersupraglottic swallow  are potential areas to investigate in therapy if warranted, though strategy was too comlpex for initial attempts in [facility] suite. Recommend pt initaite a dys 3 mech soft diet with nectar thick liquids, no straw. Will need further discussion with pt and family regarding wishes and long term plan. There is only slightly decreased risk  of aspiration with nectar and pt may wish to consume thin liquids accepting risk.  SLP Visit Diagnosis Dysphagia, pharyngeal phase (R13.13) Attention and concentration deficit following -- Frontal lobe and executive function deficit following -- Impact on safety and function Moderate aspiration risk   CHL IP TREATMENT RECOMMENDATION 10/23/2019 Treatment Recommendations F/U MBS in --- days (Comment)   Prognosis 10/23/2019 Prognosis for Safe Diet Advancement Good Barriers to Reach Goals -- Barriers/Prognosis Comment -- CHL IP DIET RECOMMENDATION 10/23/2019 SLP Diet Recommendations Dysphagia 3 (Mech soft) solids;Nectar thick liquid Liquid Administration via No straw;Cup;Spoon Medication Administration Whole meds with puree Compensations Small sips/bites;Slow rate Postural Changes Seated upright at 90 degrees   CHL IP OTHER RECOMMENDATIONS 10/23/2019 Recommended Consults -- Oral Care Recommendations Oral care BID Other Recommendations Order thickener from pharmacy   CHL IP FOLLOW UP RECOMMENDATIONS 10/23/2019 Follow up Recommendations Other (comment)   CHL IP FREQUENCY AND DURATION 10/23/2019 Speech Therapy Frequency (ACUTE ONLY) min 2x/week Treatment Duration 2 weeks      CHL IP ORAL PHASE 10/23/2019 Oral Phase WFL Oral - Pudding Teaspoon -- Oral - Pudding Cup -- Oral - Honey Teaspoon -- Oral - Honey Cup -- Oral - Nectar Teaspoon -- Oral - Nectar Cup -- Oral - Nectar Straw -- Oral - Thin Teaspoon -- Oral - Thin Cup -- Oral - Thin Straw -- Oral - Puree -- Oral - Mech Soft -- Oral - Regular -- Oral - Multi-Consistency -- Oral - Pill -- Oral Phase - Comment --  CHL IP PHARYNGEAL PHASE 10/23/2019  Pharyngeal Phase Impaired Pharyngeal- Pudding Teaspoon -- Pharyngeal -- Pharyngeal- Pudding Cup -- Pharyngeal -- Pharyngeal- Honey Teaspoon -- Pharyngeal -- Pharyngeal- Honey Cup -- Pharyngeal -- Pharyngeal- Nectar Teaspoon Pharyngeal residue - pyriform Pharyngeal -- Pharyngeal- Nectar Cup Pharyngeal residue - pyriform;WFL Pharyngeal -- Pharyngeal- Nectar Straw Penetration/Aspiration during swallow;Trace aspiration;Reduced airway/laryngeal closure Pharyngeal Material enters airway, passes BELOW cords without attempt by patient to eject out (silent aspiration) Pharyngeal- Thin Teaspoon Penetration/Aspiration during swallow;Penetration/Aspiration before swallow;Reduced airway/laryngeal closure;Trace aspiration Pharyngeal Material enters airway, passes BELOW cords without attempt by patient to eject out (silent aspiration) Pharyngeal- Thin Cup Penetration/Aspiration during swallow;Reduced airway/laryngeal closure;Trace aspiration Pharyngeal Material enters airway, passes BELOW cords without attempt by patient to eject out (silent aspiration) Pharyngeal- Thin Straw -- Pharyngeal -- Pharyngeal- Puree WFL Pharyngeal -- Pharyngeal- Mechanical Soft WFL Pharyngeal -- Pharyngeal- Regular -- Pharyngeal -- Pharyngeal- Multi-consistency -- Pharyngeal -- Pharyngeal- Pill -- Pharyngeal -- Pharyngeal Comment --  No flowsheet data found. DeBlois, Katherene Ponto 10/23/2019, 2:58 PM              ECHOCARDIOGRAM COMPLETE  Result Date: 10/22/2019    ECHOCARDIOGRAM REPORT   Patient Name:   [name] Date of Exam: 10/22/2019 Medical Rec #:  [id number]              Height:       65.0 in Accession #:    [id number]             Weight:       138.0 lb Date of Birth:  March 22, 1924              BSA:          1.690 m Patient Age:    89 years               BP:           102/67 mmHg Patient Gender: F  HR:           88 bpm. Exam Location:  Inpatient Procedure: 2D Echo, Cardiac Doppler and Color Doppler Indications:     [id number] Acute diastolic (congestive) heart failure  History:        Patient has prior history of Echocardiogram examinations, most                 recent 03/06/2015. Osteomyelitis.  Sonographer:    Merrie Roof RDCS Referring Phys: [id number] Nora  1. Left ventricular ejection fraction, by estimation, is 50 to 55%. The left ventricle has low normal function. The left ventricle has no regional wall motion abnormalities. There is mild left ventricular hypertrophy. Left ventricular diastolic parameters are indeterminate.  2. Right ventricular systolic function is mildly reduced. The right ventricular size is moderately enlarged. There is moderately elevated pulmonary artery systolic pressure. The estimated right ventricular systolic pressure is [id number] mmHg.  3. Left atrial size was severely dilated.  4. Right atrial size was severely dilated.  5. Moderate pleural effusion in the left lateral region.  6. The mitral valve is normal in structure. Trivial mitral valve regurgitation.  7. The tricuspid valve is abnormal. Tricuspid valve regurgitation is severe.  8. The aortic valve is tricuspid. Aortic valve regurgitation is not visualized. Mild to moderate aortic valve sclerosis/calcification is present, without any evidence of aortic stenosis.  9. Aortic dilatation noted. There is dilatation of the ascending aorta measuring 42 mm. 10. The inferior vena cava is dilated in size with <50% respiratory variability, suggesting right atrial pressure of 15 mmHg. FINDINGS  Left Ventricle: Left ventricular ejection fraction, by estimation, is 50 to 55%. The left ventricle has low normal function. The left ventricle has no regional wall motion abnormalities. The left ventricular internal cavity size was normal in size. There is mild left ventricular hypertrophy. Left ventricular diastolic parameters are indeterminate. Right Ventricle: The right ventricular size is moderately enlarged. Right vetricular wall thickness  was not assessed. Right ventricular systolic function is mildly reduced. There is moderately elevated pulmonary artery systolic pressure. The tricuspid regurgitant velocity is 3.17 m/s, and with an assumed right atrial pressure of 15 mmHg, the estimated right ventricular systolic pressure is [id number] mmHg. Left Atrium: Left atrial size was severely dilated. Right Atrium: Right atrial size was severely dilated. Pericardium: Trivial pericardial effusion is present. Mitral Valve: The mitral valve is normal in structure. Trivial mitral valve regurgitation. Tricuspid Valve: The tricuspid valve is abnormal. Tricuspid valve regurgitation is severe. Aortic Valve: The aortic valve is tricuspid. Aortic valve regurgitation is not visualized. Mild to moderate aortic valve sclerosis/calcification is present, without any evidence of aortic stenosis. Pulmonic Valve: The pulmonic valve was not well visualized. Pulmonic valve regurgitation is not visualized. Aorta: Aortic dilatation noted. There is dilatation of the ascending aorta measuring 42 mm. Venous: The inferior vena cava is dilated in size with less than 50% respiratory variability, suggesting right atrial pressure of 15 mmHg. IAS/Shunts: No atrial level shunt detected by color flow Doppler. Additional Comments: There is a moderate pleural effusion in the left lateral region.  LEFT VENTRICLE PLAX 2D LVIDd:         4.10 cm LVIDs:         3.20 cm LV PW:         1.10 cm LV IVS:        1.10 cm LVOT diam:     1.80 cm LV SV:         45  LV SV Index:   27 LVOT Area:     2.54 cm  LV Volumes (MOD) LV vol d, MOD A4C: 43.2 ml LV vol s, MOD A4C: 16.6 ml LV SV MOD A4C:     43.2 ml RIGHT VENTRICLE             IVC RV S prime:     10.60 cm/s  IVC diam: 2.70 cm TAPSE (M-mode): 1.7 cm LEFT ATRIUM             Index       RIGHT ATRIUM           Index LA diam:        4.10 cm 2.43 cm/m  RA Area:     28.20 cm LA Vol (A2C):   94.6 ml 55.99 ml/m RA Volume:   95.20 ml  56.35 ml/m LA Vol (A4C):   71.9  ml 42.56 ml/m LA Biplane Vol: 91.3 ml 54.04 ml/m  AORTIC VALVE LVOT Vmax:   94.20 cm/s LVOT Vmean:  63.100 cm/s LVOT VTI:    0.178 m  AORTA Ao Root diam: 3.50 cm Ao Asc diam:  4.20 cm TRICUSPID VALVE TR Peak grad:   40.2 mmHg TR Vmax:        317.00 cm/s  SHUNTS Systemic VTI:  0.18 m Systemic Diam: 1.80 cm Oswaldo Milian MD Electronically signed by Oswaldo Milian MD Signature Date/Time: 10/22/2019/7:27:27 PM    Final      LOS: 2 days   Oren Binet, MD  [facility]    To contact the attending provider between 7A-7P or the covering provider during after hours 7P-7A, please log into the web site [email or web address] and access using universal [place] password for that web site. If you do not have the password, please call the hospital operator.  10/23/2019, 3:01 PM

## 2019-10-23 NOTE — Progress Notes (Signed)
ANTICOAGULATION CONSULT NOTE - Follow Up Consult  Pharmacy Consult for Warfarin Indication: atrial fibrillation  Allergies  Allergen Reactions  . Alphagan [Brimonidine] Other (See Comments)    Burning sensation  . Apraclonidine Other (See Comments)    Pain, brow pain, tender, not able to tolerate  . Biaxin [Clarithromycin] Other (See Comments)    Unknown, per pt   . Brovana [Arformoterol] Other (See Comments)    Makes my heart race  . Budesonide Other (See Comments)    Makes my heart race  . Cefdinir Other (See Comments)    "FEEL HORRIBLE"  . Cephalexin Itching    Pt tolerated Rocephin 08/2019  . Cifenline Itching  . Clonidine Other (See Comments)    unknown  . Clonidine Derivatives Other (See Comments)    REACTION: unknown  . Dorzolamide Other (See Comments)    Redness  . Iodinated Diagnostic Agents Other (See Comments)    unknown  . Iohexol Other (See Comments)     Desc: NOTES FROM PRIOR CT STATES PRE MEDICATION PRIOR TO OMNIPAQUE ENHANCED CT   . Nsaids Other (See Comments)    Unknown  . Penicillins Itching, Swelling and Other (See Comments)    Swelling and edema of ears and rash Swelling to ears  Has patient had a PCN reaction causing immediate rash, facial/tongue/throat swelling, SOB or lightheadedness with hypotension: {no Has patient had a PCN reaction causing severe rash involving mucus membranes or skin necrosis: {no Has patient had a PCN reaction that required hospitalization no Has patient had a PCN reaction occurring within the last 10 years: no If all of the above answers are "NO", then may proceed with Cephalosporin use.  . Sulfamethoxazole Other (See Comments)    Unknown  . Sulfonamide Derivatives Itching  . Tetracycline Itching and Other (See Comments)    unknown  . Timolol Other (See Comments)    Eye red and irritated   . Vancomycin Other (See Comments)    Turn red  . Zetia [Ezetimibe] Other (See Comments)    Unknown  . Cardizem [Diltiazem Hcl]  Rash    Patient Measurements: Height: 5\' 5"  (165.1 cm) Weight: 64.1 kg (141 lb 5 oz) IBW/kg (Calculated) : 57  Vital Signs: Temp: 98.8 F (37.1 C) (04/19 0734) Temp Source: Axillary (04/19 0734) BP: 96/64 (04/19 0800) Pulse Rate: 89 (04/19 0800)  Labs: Recent Labs    10/21/19 0319 10/21/19 0319 10/21/19 [id number] 10/22/19 0526 10/23/19 0421  HGB 8.3*   < >  --  7.7* 7.9*  HCT 26.1*  --   --  23.9* 24.3*  PLT 121*  --   --  PLATELET CLUMPS NOTED ON SMEAR, UNABLE TO ESTIMATE PLATELET CLUMPS NOTED ON SMEAR, UNABLE TO ESTIMATE  LABPROT 76.0*  --   --  68.5* 58.1*  INR 9.3*  --   --  8.2* 6.6*  CREATININE 1.24*  --   --  1.02* 1.01*  TROPONINIHS 4  --  6  --   --    < > = values in this interval not displayed.    Estimated Creatinine Clearance: 31.3 mL/min (A) (by C-G formula based on SCr of 1.01 mg/dL (H)).  Assessment:   66 YOF presenting with SOB, on warfarin for Afib.  INR on admission supratherapeutic at 9.3, received vitamin K 2.5 mg PO on 4/17.     INR remains supratherapeutic (6.6).  No bleeding reported.     PTA warfarin regimen: 5 mg daily.  Last dose 4/16 prior to admit.  Goal of Therapy:  INR 2-3 Monitor platelets by anticoagulation protocol: Yes   Plan:   No warfarin again today.  Daily PT/INR.  Monitor for s/sx bleeding.  Arty Baumgartner, [place] Phone: [phone] 10/23/2019,11:15 AM

## 2019-10-24 DIAGNOSIS — N179 Acute kidney failure, unspecified: Secondary | ICD-10-CM | POA: Diagnosis not present

## 2019-10-24 DIAGNOSIS — R1310 Dysphagia, unspecified: Secondary | ICD-10-CM | POA: Diagnosis not present

## 2019-10-24 DIAGNOSIS — M86171 Other acute osteomyelitis, right ankle and foot: Secondary | ICD-10-CM | POA: Diagnosis not present

## 2019-10-24 DIAGNOSIS — I4891 Unspecified atrial fibrillation: Secondary | ICD-10-CM | POA: Diagnosis not present

## 2019-10-24 LAB — BLOOD CULTURE ID PANEL (REFLEXED)

## 2019-10-24 LAB — CBC
HCT: 25.9 % — ABNORMAL LOW (ref 36.0–46.0)
Hemoglobin: 8.4 g/dL — ABNORMAL LOW (ref 12.0–15.0)
MCH: 32.3 pg (ref 26.0–34.0)
MCHC: 32.4 g/dL (ref 30.0–36.0)
MCV: 99.6 fL (ref 80.0–100.0)
Platelets: 128 10*3/uL — ABNORMAL LOW (ref 150–400)
RBC: 2.6 MIL/uL — ABNORMAL LOW (ref 3.87–5.11)
RDW: 19.3 % — ABNORMAL HIGH (ref 11.5–15.5)
WBC: 8.8 10*3/uL (ref 4.0–10.5)
nRBC: 0 % (ref 0.0–0.2)

## 2019-10-24 LAB — FOLATE RBC
Folate, Hemolysate: 615 ng/mL
Folate, RBC: 2709 ng/mL (ref >498)
Hematocrit: 22.7 % — ABNORMAL LOW (ref 34.0–46.6)

## 2019-10-24 LAB — BASIC METABOLIC PANEL
Anion gap: 9 (ref 5–15)
BUN: 21 mg/dL (ref 8–23)
CO2: 28 mmol/L (ref 22–32)
Calcium: 8.4 mg/dL — ABNORMAL LOW (ref 8.9–10.3)
Chloride: 102 mmol/L (ref 98–111)
Creatinine, Ser: 0.89 mg/dL (ref 0.44–1.00)
GFR calc Af Amer: >60 mL/min (ref >60)
GFR calc non Af Amer: 56 mL/min — ABNORMAL LOW (ref >60)
Glucose, Bld: 96 mg/dL (ref 70–99)
Potassium: 3.6 mmol/L (ref 3.5–5.1)
Sodium: 139 mmol/L (ref 135–145)

## 2019-10-24 LAB — PROTIME-INR
INR: 6 (ref 0.8–1.2)
Prothrombin Time: 53.4 seconds — ABNORMAL HIGH (ref 11.4–15.2)

## 2019-10-24 MED ORDER — SODIUM CHLORIDE 0.9 % IV SOLN
2.0000 g | Freq: Three times a day (TID) | INTRAVENOUS | Status: DC
  Administered 2019-10-24 – 2019-10-25 (×2): 2 g via INTRAVENOUS
  Filled 2019-10-24 (×2): qty 2

## 2019-10-24 MED ORDER — VANCOMYCIN HCL IN DEXTROSE 1-5 GM/200ML-% IV SOLN
1000.0000 mg | INTRAVENOUS | Status: DC
  Administered 2019-10-24: 1000 mg via INTRAVENOUS
  Filled 2019-10-24: qty 200

## 2019-10-24 NOTE — Progress Notes (Signed)
ANTICOAGULATION + ANTIBIOTIC CONSULT NOTE - Follow Up Consult  Pharmacy Consult for Warfarin and Vancomycin Indication: atrial fibrillation, osteomyelitis  Allergies  Allergen Reactions  . Alphagan [Brimonidine] Other (See Comments)    Burning sensation  . Apraclonidine Other (See Comments)    Pain, brow pain, tender, not able to tolerate  . Biaxin [Clarithromycin] Other (See Comments)    Unknown, per pt   . Brovana [Arformoterol] Other (See Comments)    Makes my heart race  . Budesonide Other (See Comments)    Makes my heart race  . Cefdinir Other (See Comments)    "FEEL HORRIBLE"  . Cephalexin Itching    Pt tolerated Rocephin 08/2019  . Cifenline Itching  . Clonidine Other (See Comments)    unknown  . Clonidine Derivatives Other (See Comments)    REACTION: unknown  . Dorzolamide Other (See Comments)    Redness  . Iodinated Diagnostic Agents Other (See Comments)    unknown  . Iohexol Other (See Comments)     Desc: NOTES FROM PRIOR CT STATES PRE MEDICATION PRIOR TO OMNIPAQUE ENHANCED CT   . Nsaids Other (See Comments)    Unknown  . Penicillins Itching, Swelling and Other (See Comments)    Swelling and edema of ears and rash Swelling to ears  Has patient had a PCN reaction causing immediate rash, facial/tongue/throat swelling, [place] or lightheadedness with hypotension: {no Has patient had a PCN reaction causing severe rash involving mucus membranes or skin necrosis: {no Has patient had a PCN reaction that required hospitalization no Has patient had a PCN reaction occurring within the last 10 years: no If all of the above answers are "NO", then may proceed with Cephalosporin use.  . Sulfamethoxazole Other (See Comments)    Unknown  . Sulfonamide Derivatives Itching  . Tetracycline Itching and Other (See Comments)    unknown  . Timolol Other (See Comments)    Eye red and irritated   . Vancomycin Other (See Comments)    Turn red  . Zetia [Ezetimibe] Other (See  Comments)    Unknown  . Cardizem [Diltiazem Hcl] Rash    Patient Measurements: Height: 5\' 5"  (165.1 cm) Weight: 63.7 kg (140 lb 6.9 oz) IBW/kg (Calculated) : 57  Vital Signs: Temp: 97.9 F (Janice.6 C) (04/20 0735) Temp Source: Oral (04/20 0735) BP: 117/64 (04/20 0735) Pulse Rate: 92 (04/20 0735)  Labs: Recent Labs    10/22/19 0526 10/22/19 0526 10/23/19 0421 10/24/19 0357  HGB 7.7*   < > 7.9* 8.4*  HCT 23.9*  --  24.3* 25.9*  PLT PLATELET CLUMPS NOTED Janice SMEAR, UNABLE TO ESTIMATE  --  PLATELET CLUMPS NOTED Janice SMEAR, UNABLE TO ESTIMATE 128*  LABPROT 68.5*  --  58.1* 53.4*  INR 8.2*  --  6.6* 6.0*  CREATININE 1.02*  --  1.01* 0.89   < > = values in this interval not displayed.    Estimated Creatinine Clearance: 35.5 mL/min (by C-G formula based Janice SCr of 0.89 mg/dL).  Assessment:   [name], Janice warfarin for Afib.  INR Janice admission supratherapeutic at 9.3, received vitamin K 2.5 mg PO Janice 4/17.     INR remains supratherapeutic (6.0).  No bleeding reported.     PTA warfarin regimen: 5 mg daily.  Last dose 4/16 prior to admit.   Day # 4 Vancomycin and Ceftriaxone for osteomyelitis of right calcaneus.  Creatinine has trended down from 1.24 >> 0.89.  Currently Janice Vanc 1gm IV q48h after  1250 mg IV loading dose.  Recalculated AUC Janice current regimen is 310, below goal 400-550.  Goal of Therapy:  INR 2-3 Monitor platelets by anticoagulation protocol: Yes  Vancomycin AUC: 400-550   Plan:   No warfarin again today.  Daily PT/INR.  Monitor for s/sx bleeding.  Adjust Vancomycin 1gm IV from q48h to q36hrs. Next due at [place].  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  Expected AUC: 410  SCr used: 0.89  Also Janice Ceftriaxone 2 gm IV q24hrs.  Follow renal function, clinical progress and plans.   Arty Baumgartner, [place] Phone: [phone] 10/24/2019,10:13 AM

## 2019-10-24 NOTE — Progress Notes (Signed)
PROGRESS NOTE        PATIENT DETAILS Name: [name] Age: 86 y.o. Sex: female Date of Birth: August 20, 1923 Admit Date: 10/21/2019 Admitting Physician Norval Morton, MD [id number], Lenna Sciara, PA-C  Brief Narrative: Patient is a 86 y.o. female with history of chronic atrial fibrillation-on Coumadin, chronic diastolic heart failure, CAD history of remote PCI, COPD/asthma overlap presented with shortness of breath-thought to be secondary to decompensated diastolic heart failure, A. fib with RVR-and also found to have osteomyelitis of the right calcaneus.  See below for further details.  Significant events: 4/17>> admit to [facility]-osteomyelitis of right calcaneus, A. fib with RVR, decompensated heart failure and coagulopathy 4/18>> TTE-EF [id number], RV systolic function mildly reduced-RVSP 55.2  Antimicrobial therapy: Vancomycin 4/17>> Rocephin 4/17>>  Microbiology data: 4/7>> blood culture: Negative  Procedures : None  Consults: Orthopedics  DVT Prophylaxis : Supratherapeutic INR-Coumadin on hold  Subjective: Right leg less erythematous and swollen today.  Patient is leaning towards a BKA-great granddaughter at bedside.  Assessment/Plan: SIRS secondary to right lower extremity soft tissue infection with osteomyelitis and resultant pathological fracture of the right calcaneus: SIRS physiology has resolved.  Right leg less swollen and erythematous today.  Patient and family discussion in progress-they are leaning towards a BKA.  Family/patient aware of the risk of smoldering infection, metastatic infection, adverse effects of prolonged antimicrobial therapy was-if they pursue medical therapy.  Although she will be at moderate to high risk for the surgical procedure-I do not see anything prohibitive.  I have explained these issues multiple times over the past 3 days to the patient's family members-risk/benefits analysis done again-family members are  tentatively agreeable to proceed with surgery this coming Thursday.  Appreciate cardiology evaluation for perioperative risk assessment.  Chronic atrial fibrillation with RVR: Heart rate controlled-she seems to be tolerating metoprolol well.  She did have transient hypotension on admission but has not reoccurred since then.  .   Acute hypoxic respiratory failure secondary to decompensated diastolic heart failure: Volume status proved-BP soft-continue Lasix 20 mg today.  Will reassess tomorrow.   Supratherapeutic INR: Secondary to Coumadin use-no evidence of bleeding-Coumadin on hold-repeat INR tomorrow morning-if INR still unacceptable-we will give vitamin K in anticipation of surgery Thursday.  AKI: Improved-likely hemodynamically mediated  Transient hypotension: Likely secondary to medication use-monitor for now.  BP now stable.  Pulmonary hypertension: Likely secondary to COPD.  Doubt further work-up/invasive therapy will change management or outcome in this frail elderly patient.   COPD/asthma overlap: No wheezing-stable-continue bronchodilators  Anemia: Suspect has chronic anemia due to osteomyelitis/chronic inflammation-worsened by acute illness.  No indication for transfusion-follow for now.  Anxiety: Appears stable-continue as needed Ativan  Dysphagia: Chronic issue-appreciate SLP evaluation  4.5 cm ascending aortic aneurysm seen on CT chest: Stable for outpatient follow-up  Diet: Diet Order            DIET DYS 3 Room service appropriate? No; Fluid consistency: Nectar Thick  Diet effective now               Code Status:  DNR  Family Communication: Daughter over the phone on 4/20  Disposition Plan:  Home health vs SNF when ready for discharge  Barriers to Discharge: Right leg soft tissue infection with osteomyelitis requiring IV antibiotics along with decompensated heart failure.    Antimicrobial agents: Anti-infectives (From admission, onward)   Start  Dose/Rate Route Frequency Ordered Stop   10/24/19 2200  vancomycin (VANCOCIN) IVPB 1000 mg/200 mL premix     1,000 mg 100 mL/hr over 120 Minutes Intravenous Every 36 hours 10/24/19 1013     10/23/19 0800  vancomycin (VANCOCIN) IVPB 1000 mg/200 mL premix  Status:  Discontinued     1,000 mg 100 mL/hr over 120 Minutes Intravenous Every 48 hours 10/21/19 0652 10/24/19 1013   10/22/19 0800  cefTRIAXone (ROCEPHIN) 2 g in sodium chloride 0.9 % 100 mL IVPB     2 g 200 mL/hr over 30 Minutes Intravenous Every 24 hours 10/21/19 0647     10/21/19 0700  vancomycin (VANCOREADY) IVPB 1250 mg/250 mL     1,250 mg 83.3 mL/hr over 180 Minutes Intravenous  Once 10/21/19 0630 10/21/19 1140   10/21/19 0700  cefTRIAXone (ROCEPHIN) 1 g in sodium chloride 0.9 % 100 mL IVPB     1 g 200 mL/hr over 30 Minutes Intravenous  Once 10/21/19 0647 10/21/19 1225   10/21/19 0515  cefTRIAXone (ROCEPHIN) 1 g in sodium chloride 0.9 % 100 mL IVPB     1 g 200 mL/hr over 30 Minutes Intravenous  Once 10/21/19 0501 10/21/19 0646       Time spent: 25 minutes-Greater than 50% of this time was spent in counseling, explanation of diagnosis, planning of further management, and coordination of care.  MEDICATIONS: Scheduled Meds: . bacitracin   Topical Daily  . citalopram  10 mg Oral Daily  . fluticasone furoate-vilanterol  1 puff Inhalation Daily  . furosemide  20 mg Oral Daily  . latanoprost  1 drop Both Eyes QHS  . metoprolol tartrate  12.5 mg Oral BID  . montelukast  10 mg Oral QHS  . pilocarpine  1 drop Both Eyes BID  . sodium chloride flush  3 mL Intravenous Q12H  . Warfarin - Pharmacist Dosing Inpatient   Does not apply q1600   Continuous Infusions: . cefTRIAXone (ROCEPHIN)  IV 2 g (10/24/19 1009)  . vancomycin     PRN Meds:.acetaminophen **OR** acetaminophen, albuterol, ALPRAZolam, diphenhydrAMINE, fluticasone, ondansetron **OR** ondansetron (ZOFRAN) IV, Resource ThickenUp Clear   PHYSICAL EXAM: Vital  signs: Vitals:   10/24/19 0422 10/24/19 0636 10/24/19 0735 10/24/19 1419  BP:   117/64 120/64  Pulse: (!) 103 97 92 99  Resp: (!) 22 19 (!) 21 19  Temp:   97.9 F (36.6 C)   TempSrc:   Oral   SpO2: 97% 98% 95% 97%  Weight:      Height:       Filed Weights   10/22/19 0547 10/23/19 0340 10/24/19 0350  Weight: 62.6 kg 64.1 kg 63.7 kg   Body mass index is 23.37 kg/m.   Gen Exam:Alert awake-not in any distress HEENT:atraumatic, normocephalic Chest: B/L clear to auscultation anteriorly CVS:S1S2 irregular Abdomen:soft non tender, non distended Extremities: Less swelling/erythema and the right lower extremity Neurology: Non focal Skin: no rash  I have personally reviewed following labs and imaging studies  LABORATORY DATA: CBC: Recent Labs  Lab 10/21/19 0319 10/22/19 0526 10/23/19 0421 10/24/19 0357  WBC 8.4 10.0 10.2 8.8  NEUTROABS 8.0*  --   --   --   HGB 8.3* 7.7* 7.9* 8.4*  HCT 26.1* 23.9* 24.3* 25.9*  MCV 100.4* 100.4* 100.0 99.6  PLT 121* PLATELET CLUMPS NOTED ON SMEAR, UNABLE TO ESTIMATE PLATELET CLUMPS NOTED ON SMEAR, UNABLE TO ESTIMATE 128*    Basic Metabolic Panel: Recent Labs  Lab 10/21/19 0319 10/22/19 0526 10/23/19 0421  10/24/19 0357  NA 134* 137 138 139  K 3.6 3.7 3.7 3.6  CL 98 103 102 102  CO2 24 26 26 28   GLUCOSE 143* 69* 91 96  BUN 26* 22 24* 21  CREATININE 1.24* 1.02* 1.01* 0.89  CALCIUM 8.5* 8.2* 8.5* 8.4*  MG  --  1.8 2.2  --     GFR: Estimated Creatinine Clearance: 35.5 mL/min (by C-G formula based on SCr of 0.89 mg/dL).  Liver Function Tests: Recent Labs  Lab 10/21/19 0319 10/23/19 0421  AST 24 24  ALT 16 17  ALKPHOS 62 68  BILITOT 1.0 0.6  PROT 6.7 5.9*  ALBUMIN 3.2* 2.3*   No results for input(s): LIPASE, AMYLASE in the last 168 hours. No results for input(s): AMMONIA in the last 168 hours.  Coagulation Profile: Recent Labs  Lab 10/21/19 0319 10/22/19 0526 10/23/19 0421 10/24/19 0357  INR 9.3* 8.2* 6.6* 6.0*     Cardiac Enzymes: No results for input(s): CKTOTAL, CKMB, CKMBINDEX, TROPONINI in the last 168 hours.  BNP (last 3 results) No results for input(s): PROBNP in the last 8760 hours.  Lipid Profile: No results for input(s): CHOL, HDL, LDLCALC, TRIG, CHOLHDL, LDLDIRECT in the last 72 hours.  Thyroid Function Tests: No results for input(s): TSH, T4TOTAL, FREET4, T3FREE, THYROIDAB in the last 72 hours.  Anemia Panel: Recent Labs    10/22/19 0526  VITAMINB12 1,330*    Urine analysis:    Component Value Date/Time   COLORURINE YELLOW 04/05/2018 1344   APPEARANCEUR CLEAR 04/05/2018 1344   LABSPEC 1.009 04/05/2018 1344   PHURINE 6.0 04/05/2018 1344   GLUCOSEU NEGATIVE 04/05/2018 1344   HGBUR NEGATIVE 04/05/2018 1344   BILIRUBINUR NEGATIVE 04/05/2018 1344   KETONESUR NEGATIVE 04/05/2018 1344   PROTEINUR NEGATIVE 04/05/2018 1344   UROBILINOGEN 0.2 03/05/2015 1600   NITRITE NEGATIVE 04/05/2018 1344   LEUKOCYTESUR NEGATIVE 04/05/2018 1344    Sepsis Labs: Lactic Acid, Venous    Component Value Date/Time   LATICACIDVEN 0.9 10/21/2019 2111    MICROBIOLOGY: Recent Results (from the past 240 hour(s))  Blood culture (routine x 2)     Status: None (Preliminary result)   Collection Time: 10/21/19  6:13 AM   Specimen: BLOOD  Result Value Ref Range Status   Specimen Description BLOOD RIGHT ANTECUBITAL  Final   Special Requests   Final    BOTTLES DRAWN AEROBIC AND ANAEROBIC Blood Culture results may not be optimal due to an excessive volume of blood received in culture bottles   Culture  Setup Time   Final    GRAM NEGATIVE RODS ANAEROBIC BOTTLE ONLY Organism ID to follow Performed at [facility], [place] [address], [place], [place] [address]    Culture GRAM NEGATIVE RODS  Final   Report Status PENDING  Incomplete  Blood culture (routine x 2)     Status: None (Preliminary result)   Collection Time: 10/21/19  6:13 AM   Specimen: BLOOD RIGHT WRIST  Result Value Ref Range  Status   Specimen Description BLOOD RIGHT WRIST  Final   Special Requests   Final    BOTTLES DRAWN AEROBIC AND ANAEROBIC Blood Culture adequate volume   Culture   Final    NO GROWTH 3 DAYS Performed at [facility], [place] [address] [place], [place] [address]    Report Status PENDING  Incomplete  SARS CORONAVIRUS 2 (TAT 6-24 HRS) Nasopharyngeal Nasopharyngeal Swab     Status: None   Collection Time: 10/21/19  7:30  AM   Specimen: Nasopharyngeal Swab  Result Value Ref Range Status   SARS Coronavirus 2 NEGATIVE NEGATIVE Final    Comment: (NOTE) SARS-CoV-2 target nucleic acids are NOT DETECTED. The SARS-CoV-2 RNA is generally detectable in upper and lower respiratory specimens during the acute phase of infection. Negative results do not preclude SARS-CoV-2 infection, do not rule out co-infections with other pathogens, and should not be used as the sole basis for treatment or other patient management decisions. Negative results must be combined with clinical observations, patient history, and epidemiological information. The expected result is Negative. Fact Sheet for Patients: SugarRoll.be Fact Sheet for Healthcare Providers: https://www.woods-mathews.com/ This test is not yet approved or cleared by the [place] FDA and  has been authorized for detection and/or diagnosis of SARS-CoV-2 by FDA under an Emergency Use Authorization (EUA). This EUA will remain  in effect (meaning this test can be used) for the duration of the COVID-19 declaration under Section 56 4(b)(1) of the Act, 21 U.S.C. section 360bbb-3(b)(1), unless the authorization is terminated or revoked sooner. Performed at [facility], [place] [address] [place], [place] [address]     RADIOLOGY STUDIES/RESULTS: DG Foot Complete Left  Result Date: 10/23/2019 CLINICAL DATA:  86 year old female with ulceration of the left foot. EXAM: LEFT FOOT - COMPLETE 3+ VIEW  COMPARISON:  None. FINDINGS: There is no acute fracture or dislocation. Prior pinning of the second and third metatarsal heads with chronic fracture and nonunion of the second metatarsal head adjacent to the surgical pain. The surgical pain of the second metatarsal extends outside of the bone and does not fix the fracture. There is moderate degenerative changes of the first MTP joint with mild hallux valgus. The bones are osteopenic. Mild diffuse subcutaneous edema. No radiopaque foreign object or soft tissue gas. IMPRESSION: 1. No acute fracture or dislocation. 2. Prior pinning of the second and third metatarsal heads with chronic fracture and nonunion of the second metatarsal head. Electronically Signed   By: Anner Crete M.D.   On: 10/23/2019 15:26   DG Swallowing Func-Speech Pathology  Result Date: 10/23/2019 Objective Swallowing Evaluation: Type of Study: MBS-Modified Barium Swallow Study  Patient Details Name: [name] MRN: [id number] Date of Birth: 1924-01-30 Today's Date: 10/23/2019 Time: SLP Start Time (ACUTE ONLY): 0850 -SLP Stop Time (ACUTE ONLY): 0920 SLP Time Calculation (min) (ACUTE ONLY): 30 min Past Medical History: Past Medical History: Diagnosis Date . Anxiety  . Arthritis  . CAD (coronary artery disease) 2007  a. 1997 s/p PCI of diagonal. . Constipation  . COPD/Asthma  . Diverticulosis  . Esophageal reflux  . Family history of adverse reaction to anesthesia   daughter had trouble breathing after surgery, had to be reintubated . Glaucoma  . HTN (hypertension), benign  . Hyperlipidemia  . Malignant melanoma ([place])   a. 09/2012 s/p resection. . Neuropathy   lower legs . PAF (paroxysmal atrial fibrillation) (HCC)   a. intolerant to beta blockers due to asthma and intolerant to CCB due to rash;  b. CHA2DS2VASc = 6-->chronic coumadin;  c. 03/2014 Echo: EF 60-65%, Gr 1 DD, triv AI, mildly dil RV, PASP 72mmhg. [name] TIA (transient ischemic attack)   a. 02/2011 - chronic coumadin in setting of  PAF. [name] Urinary, incontinence, stress female  Past Surgical History: Past Surgical History: Procedure Laterality Date . ABDOMINAL HYSTERECTOMY   . APPENDECTOMY   . BACK SURGERY    lumbar laminectomy . CHOLECYSTECTOMY   . COLONOSCOPY   . CORONARY ANGIOPLASTY  WITH STENT PLACEMENT   . EYE SURGERY   . NASAL SINUS SURGERY   . VESICOVAGINAL FISTULA CLOSURE W/ TAH   . WEIL OSTEOTOMY Left 06/12/2016  Procedure: Left Chevron and Aiken Left [name], Weil Osteotomy 2nd and 3rd Metatarsal;  Surgeon: Newt Minion, MD;  Location: [facility];  Service: Orthopedics;  Laterality: Left; HPI: Pt is a 86 y.o. female with medical history significant of HTN, HLD, persistent atrial fibrillation on Coumadin, CAD s/p stent, COPD/asthma, and anxiety who presented with complaints of shortness of breath. CT chest: Intermediate sized bilateral pleural effusions with left basilar atelectasis. Pt was noted to have "issues" swallowing pills in ED.  No data recorded Assessment / Plan / Recommendation CHL IP CLINICAL IMPRESSIONS 10/23/2019 Clinical Impression Pt demonstrates moderate pharyngeal dysphagia with silent aspriation of thin and nectar thick bolus during the swallow due to slightly late/incompletel aryngeal closure. A chin tuck was not beneficial in preventing aspiration, but controlling bolus size with spoon or small cups sips instead of straws did not result in aspiration with nectar thick liquids. Teaching a supra glottic swallow or supersupraglottic swallow are potential areas to investigate in therapy if warranted, though strategy was too comlpex for initial attempts in [facility] suite. Recommend pt initaite a dys 3 mech soft diet with nectar thick liquids, no straw. Will need further discussion with pt and family regarding wishes and long term plan. There is only slightly decreased risk of aspiration with nectar and pt may wish to consume thin liquids accepting risk.  SLP Visit Diagnosis Dysphagia, pharyngeal phase (R13.13) Attention and  concentration deficit following -- Frontal lobe and executive function deficit following -- Impact on safety and function Moderate aspiration risk   CHL IP TREATMENT RECOMMENDATION 10/23/2019 Treatment Recommendations F/U MBS in --- days (Comment)   Prognosis 10/23/2019 Prognosis for Safe Diet Advancement Good Barriers to Reach Goals -- Barriers/Prognosis Comment -- CHL IP DIET RECOMMENDATION 10/23/2019 SLP Diet Recommendations Dysphagia 3 (Mech soft) solids;Nectar thick liquid Liquid Administration via No straw;Cup;Spoon Medication Administration Whole meds with puree Compensations Small sips/bites;Slow rate Postural Changes Seated upright at 90 degrees   CHL IP OTHER RECOMMENDATIONS 10/23/2019 Recommended Consults -- Oral Care Recommendations Oral care BID Other Recommendations Order thickener from pharmacy   CHL IP FOLLOW UP RECOMMENDATIONS 10/23/2019 Follow up Recommendations Other (comment)   CHL IP FREQUENCY AND DURATION 10/23/2019 Speech Therapy Frequency (ACUTE ONLY) min 2x/week Treatment Duration 2 weeks      CHL IP ORAL PHASE 10/23/2019 Oral Phase WFL Oral - Pudding Teaspoon -- Oral - Pudding Cup -- Oral - Honey Teaspoon -- Oral - Honey Cup -- Oral - Nectar Teaspoon -- Oral - Nectar Cup -- Oral - Nectar Straw -- Oral - Thin Teaspoon -- Oral - Thin Cup -- Oral - Thin Straw -- Oral - Puree -- Oral - Mech Soft -- Oral - Regular -- Oral - Multi-Consistency -- Oral - Pill -- Oral Phase - Comment --  CHL IP PHARYNGEAL PHASE 10/23/2019 Pharyngeal Phase Impaired Pharyngeal- Pudding Teaspoon -- Pharyngeal -- Pharyngeal- Pudding Cup -- Pharyngeal -- Pharyngeal- Honey Teaspoon -- Pharyngeal -- Pharyngeal- Honey Cup -- Pharyngeal -- Pharyngeal- Nectar Teaspoon Pharyngeal residue - pyriform Pharyngeal -- Pharyngeal- Nectar Cup Pharyngeal residue - pyriform;WFL Pharyngeal -- Pharyngeal- Nectar Straw Penetration/Aspiration during swallow;Trace aspiration;Reduced airway/laryngeal closure Pharyngeal Material enters airway, passes  BELOW cords without attempt by patient to eject out (silent aspiration) Pharyngeal- Thin Teaspoon Penetration/Aspiration during swallow;Penetration/Aspiration before swallow;Reduced airway/laryngeal closure;Trace aspiration Pharyngeal Material enters airway, passes BELOW cords  without attempt by patient to eject out (silent aspiration) Pharyngeal- Thin Cup Penetration/Aspiration during swallow;Reduced airway/laryngeal closure;Trace aspiration Pharyngeal Material enters airway, passes BELOW cords without attempt by patient to eject out (silent aspiration) Pharyngeal- Thin Straw -- Pharyngeal -- Pharyngeal- Puree WFL Pharyngeal -- Pharyngeal- Mechanical Soft WFL Pharyngeal -- Pharyngeal- Regular -- Pharyngeal -- Pharyngeal- Multi-consistency -- Pharyngeal -- Pharyngeal- Pill -- Pharyngeal -- Pharyngeal Comment --  No flowsheet data found. DeBlois, Katherene Ponto 10/23/2019, 2:58 PM              ECHOCARDIOGRAM COMPLETE  Result Date: 10/22/2019    ECHOCARDIOGRAM REPORT   Patient Name:   [name] Date of Exam: 10/22/2019 Medical Rec #:  [id number]              Height:       65.0 in Accession #:    [id number]             Weight:       138.0 lb Date of Birth:  07-Mar-1924              BSA:          1.690 m Patient Age:    78 years               BP:           102/67 mmHg Patient Gender: F                      HR:           88 bpm. Exam Location:  Inpatient Procedure: 2D Echo, Cardiac Doppler and Color Doppler Indications:    [id number] Acute diastolic (congestive) heart failure  History:        Patient has prior history of Echocardiogram examinations, most                 recent 03/06/2015. Osteomyelitis.  Sonographer:    Merrie Roof RDCS Referring Phys: [id number] [place]  1. Left ventricular ejection fraction, by estimation, is 50 to 55%. The left ventricle has low normal function. The left ventricle has no regional wall motion abnormalities. There is mild left ventricular hypertrophy. Left  ventricular diastolic parameters are indeterminate.  2. Right ventricular systolic function is mildly reduced. The right ventricular size is moderately enlarged. There is moderately elevated pulmonary artery systolic pressure. The estimated right ventricular systolic pressure is [id number] mmHg.  3. Left atrial size was severely dilated.  4. Right atrial size was severely dilated.  5. Moderate pleural effusion in the left lateral region.  6. The mitral valve is normal in structure. Trivial mitral valve regurgitation.  7. The tricuspid valve is abnormal. Tricuspid valve regurgitation is severe.  8. The aortic valve is tricuspid. Aortic valve regurgitation is not visualized. Mild to moderate aortic valve sclerosis/calcification is present, without any evidence of aortic stenosis.  9. Aortic dilatation noted. There is dilatation of the ascending aorta measuring 42 mm. 10. The inferior vena cava is dilated in size with <50% respiratory variability, suggesting right atrial pressure of 15 mmHg. FINDINGS  Left Ventricle: Left ventricular ejection fraction, by estimation, is 50 to 55%. The left ventricle has low normal function. The left ventricle has no regional wall motion abnormalities. The left ventricular internal cavity size was normal in size. There is mild left ventricular hypertrophy. Left ventricular diastolic parameters are indeterminate. Right Ventricle: The right ventricular size is moderately enlarged. Right vetricular wall thickness was not assessed.  Right ventricular systolic function is mildly reduced. There is moderately elevated pulmonary artery systolic pressure. The tricuspid regurgitant velocity is 3.17 m/s, and with an assumed right atrial pressure of 15 mmHg, the estimated right ventricular systolic pressure is [id number] mmHg. Left Atrium: Left atrial size was severely dilated. Right Atrium: Right atrial size was severely dilated. Pericardium: Trivial pericardial effusion is present. Mitral Valve: The mitral  valve is normal in structure. Trivial mitral valve regurgitation. Tricuspid Valve: The tricuspid valve is abnormal. Tricuspid valve regurgitation is severe. Aortic Valve: The aortic valve is tricuspid. Aortic valve regurgitation is not visualized. Mild to moderate aortic valve sclerosis/calcification is present, without any evidence of aortic stenosis. Pulmonic Valve: The pulmonic valve was not well visualized. Pulmonic valve regurgitation is not visualized. Aorta: Aortic dilatation noted. There is dilatation of the ascending aorta measuring 42 mm. Venous: The inferior vena cava is dilated in size with less than 50% respiratory variability, suggesting right atrial pressure of 15 mmHg. IAS/Shunts: No atrial level shunt detected by color flow Doppler. Additional Comments: There is a moderate pleural effusion in the left lateral region.  LEFT VENTRICLE PLAX 2D LVIDd:         4.10 cm LVIDs:         3.20 cm LV PW:         1.10 cm LV IVS:        1.10 cm LVOT diam:     1.80 cm LV SV:         45 LV SV Index:   27 LVOT Area:     2.54 cm  LV Volumes (MOD) LV vol d, MOD A4C: 43.2 ml LV vol s, MOD A4C: 16.6 ml LV SV MOD A4C:     43.2 ml RIGHT VENTRICLE             IVC RV S prime:     10.60 cm/s  IVC diam: 2.70 cm TAPSE (M-mode): 1.7 cm LEFT ATRIUM             Index       RIGHT ATRIUM           Index LA diam:        4.10 cm 2.43 cm/m  RA Area:     28.20 cm LA Vol (A2C):   94.6 ml 55.99 ml/m RA Volume:   95.20 ml  56.35 ml/m LA Vol (A4C):   71.9 ml 42.56 ml/m LA Biplane Vol: 91.3 ml 54.04 ml/m  AORTIC VALVE LVOT Vmax:   94.20 cm/s LVOT Vmean:  63.100 cm/s LVOT VTI:    0.178 m  AORTA Ao Root diam: 3.50 cm Ao Asc diam:  4.20 cm TRICUSPID VALVE TR Peak grad:   40.2 mmHg TR Vmax:        317.00 cm/s  SHUNTS Systemic VTI:  0.18 m Systemic Diam: 1.80 cm Oswaldo Milian MD Electronically signed by Oswaldo Milian MD Signature Date/Time: 10/22/2019/7:27:27 PM    Final      LOS: 3 days   Oren Binet, MD  [facility]    To contact the attending provider between 7A-7P or the covering provider during after hours 7P-7A, please log into the web site [email or web address] and access using universal [place] password for that web site. If you do not have the password, please call the hospital operator.  10/24/2019, 2:50 PM

## 2019-10-24 NOTE — Progress Notes (Signed)
  Speech Language Pathology Treatment: Dysphagia  Patient Details Name: [name] MRN: [id number] DOB: [birth year] Today's Date: 10/24/2019 Time: 1040-1050 SLP Time Calculation (min) (ACUTE ONLY): 10 min  Assessment / Plan / Recommendation Clinical Impression  Pt verbalizes understanding of rationale for thickened liquids; reports she had been worried she was choking. She wants to clear her chest congestion. We discussed how short term use of nectar thick liquids could help her chest congestion while she is recovering, but may not be needed into the long term. Pt demonstrated consistent small sips with nectar thick shake after one verbal cue and accepted repositioning. Also able to masticate a banana. Recommend pt continue current diet. Will f/u while admitted, but would not anticipate diet upgrade prior to d/c.   HPI HPI: Pt is a 86 y.o. female with medical history significant of HTN, HLD, persistent atrial fibrillation on Coumadin, CAD s/p stent, COPD/asthma, and anxiety who presented with complaints of shortness of breath. CT chest: Intermediate sized bilateral pleural effusions with left basilar atelectasis. Pt was noted to have "issues" swallowing pills in ED.      SLP Plan  Continue with current plan of care       Recommendations  Diet recommendations: Dysphagia 3 (mechanical soft);Nectar-thick liquid Liquids provided via: Cup;Teaspoon;No straw Medication Administration: Whole meds with puree Supervision: Patient able to self feed Compensations: Small sips/bites;Slow rate                Follow up Recommendations: Other (comment) SLP Visit Diagnosis: Dysphagia, pharyngeal phase (R13.13) Plan: Continue with current plan of care       GO                Nikolos Billig, Katherene Ponto 10/24/2019, 11:19 AM

## 2019-10-24 NOTE — Progress Notes (Signed)
10/23/19 1400  SLP Visit Information  SLP Received On 10/23/19  General Information  Date of Onset 10/21/19  HPI Pt is a 86 y.o. female with medical history significant of HTN, HLD, persistent atrial fibrillation on Coumadin, CAD s/p stent, COPD/asthma, and anxiety who presented with complaints of shortness of breath. CT chest: Intermediate sized bilateral pleural effusions with left basilar atelectasis. Pt was noted to have "issues" swallowing pills in ED.  Type of Study MBS-Modified Barium Swallow Study  Previous Swallow Assessment None  Diet Prior to this Study Regular;Thin liquids  Temperature Spikes Noted No  Respiratory Status Nasal cannula  History of Recent Intubation No  Behavior/Cognition Alert;Cooperative;Pleasant mood  Oral Cavity Assessment WFL  Oral Cavity - Dentition Adequate natural dentition;Dentures, top;Dentures, bottom  Vision Functional for self feeding  Self-Feeding Abilities Able to feed self  Patient Positioning Upright in chair  Baseline Vocal Quality Low vocal intensity  Volitional Cough Weak;Wet  Volitional Swallow Able to elicit  Anatomy WFL  Oral Motor/Sensory Function  Overall Oral Motor/Sensory Function WFL  Oral Preparation/Oral Phase  Oral Phase WFL  Pharyngeal Phase  Pharyngeal Phase Impaired  Pharyngeal - Nectar  Pharyngeal- Nectar Teaspoon Pharyngeal residue - pyriform  Pharyngeal- Nectar Cup Pharyngeal residue - pyriform;WFL  Pharyngeal- Nectar Straw Penetration/Aspiration during swallow;Trace aspiration;Reduced airway/laryngeal closure  Pharyngeal Material enters airway, passes BELOW cords without attempt by patient to eject out (silent aspiration)  Pharyngeal - Thin  Pharyngeal- Thin Cup Penetration/Aspiration during swallow;Reduced airway/laryngeal closure;Trace aspiration  Pharyngeal- Thin Teaspoon Penetration/Aspiration during swallow;Penetration/Aspiration before swallow;Reduced airway/laryngeal closure;Trace aspiration  Pharyngeal  Material enters airway, passes BELOW cords without attempt by patient to eject out (silent aspiration)  Pharyngeal Material enters airway, passes BELOW cords without attempt by patient to eject out (silent aspiration)  Pharyngeal - Solids  Pharyngeal- Puree WFL  Pharyngeal- Mechanical Soft WFL  Clinical Impression  Clinical Impression Pt demonstrates moderate pharyngeal dysphagia with silent aspriation of thin and nectar thick bolus during the swallow due to slightly late/incompletel aryngeal closure. A chin tuck was not beneficial in preventing aspiration, but controlling bolus size with spoon or small cups sips instead of straws did not result in aspiration with nectar thick liquids. Teaching a supra glottic swallow or supersupraglottic swallow are potential areas to investigate in therapy if warranted, though strategy was too comlpex for initial attempts in [facility] suite. Recommend pt initaite a dys 3 mech soft diet with nectar thick liquids, no straw. Will need further discussion with pt and family regarding wishes and long term plan. There is only slightly decreased risk of aspiration with nectar and pt may wish to consume thin liquids accepting risk.   SLP Visit Diagnosis Dysphagia, pharyngeal phase (R13.13)  Impact on safety and function Moderate aspiration risk  Swallow Evaluation Recommendations  SLP Diet Recommendations Dysphagia 3 (Mech soft) solids;Nectar thick liquid  Liquid Administration via No straw;Cup;Spoon  Medication Administration Whole meds with puree  Supervision Patient able to self feed  Compensations Small sips/bites;Slow rate  Postural Changes Seated upright at 90 degrees  Treatment Plan  Oral Care Recommendations Oral care BID  Other Recommendations Order thickener from pharmacy  Treatment Recommendations F/U MBS in ___ days (Comment)  Follow up Recommendations Other (comment)  Speech Therapy Frequency (ACUTE ONLY) min 2x/week  Treatment Duration 2 weeks  Interventions  Diet toleration management by SLP  Prognosis  Prognosis for Safe Diet Advancement Good  Individuals Consulted  Consulted and Agree with Results and Recommendations Patient  SLP Time Calculation  SLP Start  Time (ACUTE ONLY) 0850  SLP Stop Time (ACUTE ONLY) 0920  SLP Time Calculation (min) (ACUTE ONLY) 30 min  SLP Evaluations  $ SLP Speech Visit 1 Visit  SLP Evaluations  $MBS Swallow 1 Procedure  $Swallowing Treatment 1 Procedure

## 2019-10-24 NOTE — Progress Notes (Signed)
PHARMACY - PHYSICIAN COMMUNICATION CRITICAL VALUE ALERT - BLOOD CULTURE IDENTIFICATION (BCID)  [name] is an 86 y.o. female who presented to [facility] on 10/21/2019 with a chief complaint of osteomyelitis of the right calcaneus  Assessment:  GNR in anaerobic blood culture, possibly from osteomyelitis  Name of physician (or Provider) Contacted: Dr. Sloan Leiter  Current antibiotics: Rocephin  Changes to prescribed antibiotics recommended:  Change Rocephin to Cefepime until GNR is identified. Recommendations accepted by provider  Results for orders placed or performed during the hospital encounter of 10/21/19  Blood Culture ID Panel (Reflexed) (Collected: 10/21/2019  6:13 AM)  Result Value Ref Range   Enterococcus species NOT DETECTED NOT DETECTED   Listeria monocytogenes NOT DETECTED NOT DETECTED   Staphylococcus species NOT DETECTED NOT DETECTED   Staphylococcus aureus (BCID) NOT DETECTED NOT DETECTED   Streptococcus species NOT DETECTED NOT DETECTED   Streptococcus agalactiae NOT DETECTED NOT DETECTED   Streptococcus pneumoniae NOT DETECTED NOT DETECTED   Streptococcus pyogenes NOT DETECTED NOT DETECTED   Acinetobacter baumannii NOT DETECTED NOT DETECTED   Enterobacteriaceae species NOT DETECTED NOT DETECTED   Enterobacter cloacae complex NOT DETECTED NOT DETECTED   Escherichia coli NOT DETECTED NOT DETECTED   Klebsiella oxytoca NOT DETECTED NOT DETECTED   Klebsiella pneumoniae NOT DETECTED NOT DETECTED   Proteus species NOT DETECTED NOT DETECTED   Serratia marcescens NOT DETECTED NOT DETECTED   Haemophilus influenzae NOT DETECTED NOT DETECTED   Neisseria meningitidis NOT DETECTED NOT DETECTED   Pseudomonas aeruginosa NOT DETECTED NOT DETECTED   Candida albicans NOT DETECTED NOT DETECTED   Candida glabrata NOT DETECTED NOT DETECTED   Candida krusei NOT DETECTED NOT DETECTED   Candida parapsilosis NOT DETECTED NOT DETECTED   Candida tropicalis NOT DETECTED NOT  DETECTED    Corinda Gubler 10/24/2019  4:39 PM

## 2019-10-24 NOTE — Plan of Care (Signed)
  Problem: Education: Goal: Knowledge of General Education information will improve Description Including pain rating scale, medication(s)/side effects and non-pharmacologic comfort measures Outcome: Progressing   Problem: Health Behavior/Discharge Planning: Goal: Ability to manage health-related needs will improve Outcome: Progressing   

## 2019-10-25 DIAGNOSIS — N179 Acute kidney failure, unspecified: Secondary | ICD-10-CM | POA: Diagnosis not present

## 2019-10-25 DIAGNOSIS — R1310 Dysphagia, unspecified: Secondary | ICD-10-CM | POA: Diagnosis not present

## 2019-10-25 DIAGNOSIS — I4891 Unspecified atrial fibrillation: Secondary | ICD-10-CM | POA: Diagnosis not present

## 2019-10-25 LAB — CBC
HCT: 25.7 % — ABNORMAL LOW (ref 36.0–46.0)
Hemoglobin: 8.3 g/dL — ABNORMAL LOW (ref 12.0–15.0)
MCH: 32.3 pg (ref 26.0–34.0)
MCHC: 32.3 g/dL (ref 30.0–36.0)
MCV: 100 fL (ref 80.0–100.0)
Platelets: 128 10*3/uL — ABNORMAL LOW (ref 150–400)
RBC: 2.57 MIL/uL — ABNORMAL LOW (ref 3.87–5.11)
RDW: 19.3 % — ABNORMAL HIGH (ref 11.5–15.5)
WBC: 5.8 10*3/uL (ref 4.0–10.5)
nRBC: 0 % (ref 0.0–0.2)

## 2019-10-25 LAB — PROTIME-INR
INR: 5.3 (ref 0.8–1.2)
INR: 3.7 — ABNORMAL HIGH (ref 0.8–1.2)
Prothrombin Time: 48.3 seconds — ABNORMAL HIGH (ref 11.4–15.2)
Prothrombin Time: 37 seconds — ABNORMAL HIGH (ref 11.4–15.2)

## 2019-10-25 LAB — BASIC METABOLIC PANEL
Anion gap: 7 (ref 5–15)
BUN: 15 mg/dL (ref 8–23)
CO2: 29 mmol/L (ref 22–32)
Calcium: 8.2 mg/dL — ABNORMAL LOW (ref 8.9–10.3)
Chloride: 106 mmol/L (ref 98–111)
Creatinine, Ser: 0.75 mg/dL (ref 0.44–1.00)
GFR calc Af Amer: >60 mL/min (ref >60)
GFR calc non Af Amer: >60 mL/min (ref >60)
Glucose, Bld: 92 mg/dL (ref 70–99)
Potassium: 3.4 mmol/L — ABNORMAL LOW (ref 3.5–5.1)
Sodium: 142 mmol/L (ref 135–145)

## 2019-10-25 MED ORDER — SODIUM CHLORIDE 0.9 % IV SOLN
2.0000 g | Freq: Two times a day (BID) | INTRAVENOUS | Status: DC
  Administered 2019-10-25 – 2019-10-26 (×2): 2 g via INTRAVENOUS
  Filled 2019-10-25 (×2): qty 2

## 2019-10-25 MED ORDER — POTASSIUM CHLORIDE CRYS ER 20 MEQ PO TBCR
40.0000 meq | EXTENDED_RELEASE_TABLET | Freq: Once | ORAL | Status: AC
  Administered 2019-10-25: 10:00:00 40 meq via ORAL
  Filled 2019-10-25: qty 2

## 2019-10-25 MED ORDER — FUROSEMIDE 10 MG/ML IJ SOLN
80.0000 mg | Freq: Once | INTRAMUSCULAR | Status: AC
  Administered 2019-10-25: 80 mg via INTRAVENOUS
  Filled 2019-10-25: qty 8

## 2019-10-25 MED ORDER — LEVALBUTEROL HCL 0.63 MG/3ML IN NEBU
0.6300 mg | INHALATION_SOLUTION | Freq: Three times a day (TID) | RESPIRATORY_TRACT | Status: DC
  Administered 2019-10-25 – 2019-10-26 (×5): 0.63 mg via RESPIRATORY_TRACT
  Filled 2019-10-25 (×5): qty 3

## 2019-10-25 MED ORDER — PHYTONADIONE 5 MG PO TABS
5.0000 mg | ORAL_TABLET | Freq: Once | ORAL | Status: AC
  Administered 2019-10-25: 5 mg via ORAL
  Filled 2019-10-25: qty 1

## 2019-10-25 MED ORDER — METRONIDAZOLE 500 MG PO TABS
500.0000 mg | ORAL_TABLET | Freq: Three times a day (TID) | ORAL | Status: DC
  Administered 2019-10-25 – 2019-10-31 (×19): 500 mg via ORAL
  Filled 2019-10-25 (×19): qty 1

## 2019-10-25 MED ORDER — PHYTONADIONE 5 MG PO TABS
5.0000 mg | ORAL_TABLET | Freq: Once | ORAL | Status: AC
  Administered 2019-10-25: 5 mg via ORAL
  Filled 2019-10-25 (×2): qty 1

## 2019-10-25 MED ORDER — METOPROLOL TARTRATE 25 MG PO TABS
25.0000 mg | ORAL_TABLET | Freq: Three times a day (TID) | ORAL | Status: DC
  Administered 2019-10-25 – 2019-10-27 (×5): 25 mg via ORAL
  Filled 2019-10-25 (×6): qty 1

## 2019-10-25 NOTE — Progress Notes (Signed)
ANTICOAGULATION + ANTIBIOTIC CONSULT NOTE - Follow Up Consult  Pharmacy Consult for Warfarin and Vancomycin Indication: atrial fibrillation, osteomyelitis  Allergies  Allergen Reactions  . Alphagan [Brimonidine] Other (See Comments)    Burning sensation  . Apraclonidine Other (See Comments)    Pain, brow pain, tender, not able to tolerate  . Biaxin [Clarithromycin] Other (See Comments)    Unknown, per pt   . Brovana [Arformoterol] Other (See Comments)    Makes my heart race  . Budesonide Other (See Comments)    Makes my heart race  . Cefdinir Other (See Comments)    "FEEL HORRIBLE"  . Cephalexin Itching    Pt tolerated Rocephin 08/2019  . Cifenline Itching  . Clonidine Other (See Comments)    unknown  . Clonidine Derivatives Other (See Comments)    REACTION: unknown  . Dorzolamide Other (See Comments)    Redness  . Iodinated Diagnostic Agents Other (See Comments)    unknown  . Iohexol Other (See Comments)     Desc: NOTES FROM PRIOR CT STATES PRE MEDICATION PRIOR TO OMNIPAQUE ENHANCED CT   . Nsaids Other (See Comments)    Unknown  . Penicillins Itching, Swelling and Other (See Comments)    Swelling and edema of ears and rash Swelling to ears  Has patient had a PCN reaction causing immediate rash, facial/tongue/throat swelling, SOB or lightheadedness with hypotension: {no Has patient had a PCN reaction causing severe rash involving mucus membranes or skin necrosis: {no Has patient had a PCN reaction that required hospitalization no Has patient had a PCN reaction occurring within the last 10 years: no If all of the above answers are "NO", then may proceed with Cephalosporin use.  . Sulfamethoxazole Other (See Comments)    Unknown  . Sulfonamide Derivatives Itching  . Tetracycline Itching and Other (See Comments)    unknown  . Timolol Other (See Comments)    Eye red and irritated   . Vancomycin Other (See Comments)    Turn red  . Zetia [Ezetimibe] Other (See  Comments)    Unknown  . Cardizem [Diltiazem Hcl] Rash    Patient Measurements: Height: 5\' 5"  (165.1 cm) Weight: 63.1 kg (139 lb 1.8 oz) IBW/kg (Calculated) : 57  Vital Signs: Temp: 98.1 F (36.7 C) (04/21 0845) Temp Source: Oral (04/21 0845) BP: 108/61 (04/21 0845) Pulse Rate: 93 (04/21 0845)  Labs: Recent Labs    10/23/19 0421 10/23/19 0421 10/24/19 0357 10/25/19 0408  HGB 7.9*   < > 8.4* 8.3*  HCT 24.3*  --  25.9* 25.7*  PLT PLATELET CLUMPS NOTED ON SMEAR, UNABLE TO ESTIMATE  --  128* 128*  LABPROT 58.1*  --  53.4* 48.3*  INR 6.6*  --  6.0* 5.3*  CREATININE 1.01*  --  0.89 0.75   < > = values in this interval not displayed.    Estimated Creatinine Clearance: 39.5 mL/min (by C-G formula based on SCr of 0.75 mg/dL).  Assessment:   36 YOF presenting with SOB, on warfarin for Afib.  INR on admission supratherapeutic at 9.3, received vitamin K 2.5 mg PO on 4/17.     INR remains supratherapeutic (5.3).  No bleeding reported.  Now planning R BKA on 4/22.   Vitamin K 5 mg PO given this am.     PTA warfarin regimen: 5 mg daily.  Last dose 4/16 prior to admit.   Day # 5 antibiotics for osteomyelitis of right calcaneus.. Ceftriaxone changed to Cefepime 4/20 pm and Flagyl added  this morning for anaerobic coverage.  Vancomycin stopped.  Goal of Therapy:  INR 2-3 Monitor platelets by anticoagulation protocol: Yes    Plan:   Vitamin K 5 mg PO given ~10am  Will recheck PT/INR at 6pm today to assess response and possible additional reversal for OR 4/22, time of surgery TBD.  No warfarin today.  Continue daily PT/INR.  Flagyl added today, which may slow clearance of warfarin.  Cefepime 2gm IV q8h adjusted to q12h for renal function.  Follow renal function, clinical progress, final culture data and antibiotic plans.  Arty Baumgartner, [place] Phone: [phone] 10/25/2019,10:31 AM

## 2019-10-25 NOTE — Progress Notes (Signed)
Occupational Therapy Evaluation Patient Details Name: [name] MRN: [id number] DOB: 10-02-1923 Today's Date: 10/25/2019    History of Present Illness Patient is a 86 y.o. female with history of chronic atrial fibrillation-on Coumadin, chronic diastolic heart failure, CAD history of remote PCI, COPD/asthma overlap presented with shortness of breath-thought to be secondary to decompensated diastolic heart failure, A. fib with RVR-and also found to have osteomyelitis of the right calcaneus. Planned surgical intervention of R BKA planned.    Clinical Impression   PTA, pt lives alone and was Modified Independent with ADLs and mobility in the home using Rollator. Family assisted with IADLs. Pt received in bed and agreeable to participate with therapy. Provided education to pt on current recommendations to avoid WB through R heel currently (planned BKA tomorrow). Pt demonstrated ability to assist in following WB precautions, often kicking R LE out in front during transitions. Pt Mod A + 2 for bed mobility to sit EOB, Max A + 2 for squat pivot to drop arm recliner chair with cues for safe sequencing. Pt Max A to don socks sitting EOB - pt attempted to use figure four position for L LE, but unable to complete due to fatigue and weakness. Pt O2 stats > 90% on 5 L O2 (did not wear O2 at baseline). Based on current functional abilities and upcoming surgery, recommend SNF for short term rehab prior to DC home. Will continue to follow acutely.    Follow Up Recommendations  SNF;Supervision/Assistance - 24 hour    Equipment Recommendations  Other (comment)(TBD s/p BKA)    Recommendations for Other Services       Precautions / Restrictions Precautions Precautions: Fall Precaution Comments: no weight on R heel Restrictions Weight Bearing Restrictions: Yes Other Position/Activity Restrictions: no weight on R heel      Mobility Bed Mobility Overal bed mobility: Needs Assistance Bed  Mobility: Supine to Sit     Supine to sit: Mod assist;HOB elevated;+2 for physical assistance     General bed mobility comments: Mod A for advancement of trunk with HHA, scooting forward on bed safely   Transfers Overall transfer level: Needs assistance Equipment used: None Transfers: Squat Pivot Transfers     Squat pivot transfers: Max assist;+2 physical assistance     General transfer comment: Max A + 2 for squat pivot from bed > recliner chair. verbal and tactile cues used for safe sequencing of task. Pt Max A to scoot in recliner chair     Balance Overall balance assessment: Needs assistance Sitting-balance support: Bilateral upper extremity supported;Feet supported Sitting balance-Leahy Scale: Fair       Standing balance-Leahy Scale: Zero Standing balance comment: unable secondary to WB restrictions to R LE                           ADL either performed or assessed with clinical judgement   ADL Overall ADL's : Needs assistance/impaired Eating/Feeding: Set up;Sitting   Grooming: Set up;Sitting   Upper Body Bathing: Set up;Sitting   Lower Body Bathing: Maximal assistance;Sit to/from stand;Sitting/lateral leans   Upper Body Dressing : Set up;Sitting   Lower Body Dressing: Maximal assistance;Sitting/lateral leans;Sit to/from stand   Toilet Transfer: +2 for physical assistance Toilet Transfer Details (indicate cue type and reason): did not attempt, but suspect +2 Toileting- Clothing Manipulation and Hygiene: Total assistance;Sit to/from stand;Sitting/lateral lean         General ADL Comments: Pt requires increased assistance for LB ADLs  and ADL transfers due to recommendations to avoid WB through R heel (also undergoing R BKA tomorrow). Due to restrictions with R LE use, pt limited by decreased strength, endurance      Vision Baseline Vision/History: Wears glasses Patient Visual Report: No change from baseline       Perception     Praxis       Pertinent Vitals/Pain Pain Assessment: No/denies pain     Hand Dominance Right   Extremity/Trunk Assessment Upper Extremity Assessment Upper Extremity Assessment: Generalized weakness   Lower Extremity Assessment Lower Extremity Assessment: Defer to PT evaluation RLE Sensation: decreased light touch(absent sensation R foot) LLE Sensation: decreased light touch(impaired to absent sensation L foot)       Communication Communication Communication: No difficulties   Cognition Arousal/Alertness: Awake/alert Behavior During Therapy: WFL for tasks assessed/performed Overall Cognitive Status: Within Functional Limits for tasks assessed                                     General Comments  Pt received on 5 L O2 in bed, 96% at rest and HR at 117bpm. O2 stats decreased to 92% during activity. BP after transfer was 137/68    Exercises     Shoulder Instructions      Home Living Family/patient expects to be discharged to:: Private residence Living Arrangements: Other (Comment);Alone(dog) Available Help at Discharge: Family;Other (Comment)(dtr come when needed) Type of Home: House Home Access: Stairs to enter [employer] of Steps: 1 Entrance Stairs-Rails: Right Home Layout: One level     Bathroom Shower/Tub: Occupational psychologist: Handicapped height Bathroom Accessibility: Yes   Home Equipment: Environmental consultant - 4 wheels;Shower seat          Prior Functioning/Environment Level of Independence: Independent with assistive device(s)        Comments: modI with 4WW, assist with negotiating step in/out of the house, dtrs assist with cleaning, eats mostly frozen food        OT Problem List: Decreased strength;Decreased activity tolerance;Impaired balance (sitting and/or standing);Decreased coordination;Decreased knowledge of use of DME or AE;Cardiopulmonary status limiting activity      OT Treatment/Interventions: Self-care/ADL  training;Therapeutic exercise;Energy conservation;DME and/or AE instruction;Therapeutic activities;Patient/family education    OT Goals(Current goals can be found in the care plan section) Acute Rehab OT Goals Patient Stated Goal: be able to go home  OT Goal Formulation: With patient Time For Goal Achievement: 11/08/19 Potential to Achieve Goals: Good ADL Goals Pt Will Perform Upper Body Bathing: with modified independence;sitting Pt Will Perform Lower Body Bathing: with min assist;sit to/from stand;sitting/lateral leans Pt Will Perform Upper Body Dressing: with modified independence;sitting Pt Will Perform Lower Body Dressing: with min assist;sitting/lateral leans;sit to/from stand Pt Will Transfer to Toilet: with mod assist;stand pivot transfer;bedside commode Pt Will Perform Toileting - Clothing Manipulation and hygiene: sitting/lateral leans;sit to/from stand;with mod assist  OT Frequency: Min 2X/week   Barriers to D/C:            Co-evaluation PT/OT/SLP Co-Evaluation/Treatment: Yes Reason for Co-Treatment: Complexity of the patient's impairments (multi-system involvement);For patient/therapist safety;To address functional/ADL transfers          AM-PAC OT "6 Clicks" Daily Activity     Outcome Measure Help from another person eating meals?: A Little Help from another person taking care of personal grooming?: A Little Help from another person toileting, which includes using toliet, bedpan, or urinal?: Total Help  from another person bathing (including washing, rinsing, drying)?: A Lot Help from another person to put on and taking off regular upper body clothing?: A Little Help from another person to put on and taking off regular lower body clothing?: A Lot 6 Click Score: 14   End of Session Equipment Utilized During Treatment: Gait belt;Oxygen Nurse Communication: Mobility status  Activity Tolerance: Patient tolerated treatment well Patient left: in chair;with call  bell/phone within reach;with chair alarm set  OT Visit Diagnosis: Unsteadiness on feet (R26.81);Other abnormalities of gait and mobility (R26.89);Muscle weakness (generalized) (M62.81);History of falling (Z91.81)                Time: [id number] OT Time Calculation (min): 39 min Charges:  OT General Charges $OT Visit: 1 Visit OT Evaluation $OT Eval Moderate Complexity: 1 Mod  Layla Maw, OTR/L  Layla Maw 10/25/2019, 1:10 PM

## 2019-10-25 NOTE — Progress Notes (Signed)
Brief orthopedic progress note:  I had a lengthy conversation with patient's family regarding treatment options.  They had several questions that were answered.  Questions were regarding nonoperative treatment with continued antibiotics versus surgical intervention in the form of below the knee amputation.  Ultimately we decided to schedule for below the knee amputation on 10/26/2019 pending her family members meeting together and discussing this decision.  She will be posted for surgery and be n.p.o. past midnight on 10/25/2019.  They voiced understanding about the surgical risk as well as the proposed surgical procedure and postoperative recovery.  They understand that below the knee amputation is the most sure way to eradicate her infection.

## 2019-10-25 NOTE — Progress Notes (Signed)
PHARMACY - PHYSICIAN COMMUNICATION CRITICAL VALUE ALERT - BLOOD CULTURE IDENTIFICATION (BCID)  [name] is an 86 y.o. female who presented to [facility] on 10/21/2019 with a chief complaint of osteomyelitis of the right calcaneus  Assessment:  GNR in anaerobic blood culture, possibly from osteomyelitis  Name of physician (or Provider) Contacted: Dr. Sloan Leiter  Current antibiotics: cefepime + vancomycin  Changes to prescribed antibiotics recommended:  Discontinue vancomycin and add flagyl  Recommendations accepted by provider  Results for orders placed or performed during the hospital encounter of 10/21/19  Blood Culture ID Panel (Reflexed) (Collected: 10/21/2019  6:13 AM)  Result Value Ref Range   Enterococcus species NOT DETECTED NOT DETECTED   Listeria monocytogenes NOT DETECTED NOT DETECTED   Staphylococcus species NOT DETECTED NOT DETECTED   Staphylococcus aureus (BCID) NOT DETECTED NOT DETECTED   Streptococcus species NOT DETECTED NOT DETECTED   Streptococcus agalactiae NOT DETECTED NOT DETECTED   Streptococcus pneumoniae NOT DETECTED NOT DETECTED   Streptococcus pyogenes NOT DETECTED NOT DETECTED   Acinetobacter baumannii NOT DETECTED NOT DETECTED   Enterobacteriaceae species NOT DETECTED NOT DETECTED   Enterobacter cloacae complex NOT DETECTED NOT DETECTED   Escherichia coli NOT DETECTED NOT DETECTED   Klebsiella oxytoca NOT DETECTED NOT DETECTED   Klebsiella pneumoniae NOT DETECTED NOT DETECTED   Proteus species NOT DETECTED NOT DETECTED   Serratia marcescens NOT DETECTED NOT DETECTED   Haemophilus influenzae NOT DETECTED NOT DETECTED   Neisseria meningitidis NOT DETECTED NOT DETECTED   Pseudomonas aeruginosa NOT DETECTED NOT DETECTED   Candida albicans NOT DETECTED NOT DETECTED   Candida glabrata NOT DETECTED NOT DETECTED   Candida krusei NOT DETECTED NOT DETECTED   Candida parapsilosis NOT DETECTED NOT DETECTED   Candida tropicalis NOT DETECTED NOT  DETECTED    Sherren Kerns, PharmD PGY1 Acute Care Pharmacy Resident 10/25/2019  8:41 AM

## 2019-10-25 NOTE — Progress Notes (Signed)
Physical Therapy Evaluation  Patient presents with decreased strength, impaired to absent sensation in bilat feet, impaired balance, and is below her PLOF of modI with 4WW. She requires maxA x 2 for squat pivot bed>recliner chair with armrest down on chair. Recommend continued skilled PT services and discharge to SNF for short term rehabilitation. Patient is scheduled for R BKA tomorrow. Patient will require new physical therapy re-evlauation orders, when appropriate, after surgery.    10/25/19 0901  PT Visit Information  Last PT Received On 10/25/19  Assistance Needed +2  PT/OT/SLP Co-Evaluation/Treatment Yes  Reason for Co-Treatment Complexity of the patient's impairments (multi-system involvement);For patient/therapist safety  PT goals addressed during session Mobility/safety with mobility;Balance  History of Present Illness 86 year old female admitted 10/21/19 with SOB, thought to be secondary to decompensated diastolic heart failure and Afib with RVR. Patient also found to have osteomyelitis R calcaneous. Orthopedics consulted. Patient with SIRS ssecondary to RLE soft tissue infection with osteomyelitis and pathological fracture R calcaneus. Patient also with supratherapeutic INR from Coumadin use, no evidence of bleeding. Coumadin on hold. PMH: chronic atrial fibrillation-on Coumadin, chronic diastolic heart failure, CAD history of remote PCI, COPD/asthma overlap   Precautions  Precautions Fall  Precaution Comments no weight on R heel, aspiration precautions (dysphagia 3 with nectar) ((confirmed with ortho MD Lucia Gaskins))  Restrictions  Weight Bearing Restrictions Yes  Other Position/Activity Restrictions no weight on R heel  Home Living  Family/patient expects to be discharged to: Private residence  Living Arrangements Other (Comment);Alone (dog)  Available Help at Discharge Family;Other (Comment) (dtr comes by when needed)  Type of [place] to enter  Entrance  Stairs-Number of Steps 1  Entrance Stairs-Rails Right  Home Layout One level  Bathroom Shower/Tub Walk-in shower  Bathroom Toilet Handicapped height  Bathroom Accessibility Yes  Home Equipment [place] - 4 wheels;Shower seat  Prior Function  Level of Independence Independent with assistive device(s)  Comments modI with [phone], assist with negotiating step in/out of the house, dtrs assist with cleaning, eats mostly frozen food  Communication  Communication No difficulties  Pain Assessment  Pain Assessment No/denies pain  Cognition  Arousal/Alertness Awake/alert  Behavior During Therapy WFL for tasks assessed/performed  Overall Cognitive Status Within Functional Limits for tasks assessed  Upper Extremity Assessment  Upper Extremity Assessment Defer to OT evaluation  Lower Extremity Assessment  Lower Extremity Assessment Generalized weakness;RLE deficits/detail;LLE deficits/detail  RLE Deficits / Details RLE grossly 2- to 3-/5  RLE Sensation decreased light touch (absent sensation R foot)  LLE Deficits / Details LLE grossly 2- to 3-/5  LLE Sensation decreased light touch (impaired to absent sensation L foot)  Bed Mobility  Overal bed mobility Needs Assistance  Bed Mobility Supine to Sit  Supine to sit Mod assist;+2 for physical assistance;HOB elevated  Transfers  Overall transfer level Needs assistance  Transfers Squat Pivot Transfers  Squat pivot transfers Max assist;+2 physical assistance;From elevated surface;+2 safety/equipment  General transfer comment MaxA x 2 person assist from EOB>chair with patient extending RLE to ensure no weight through heel during transfer. Armrest down on chair. Armrest only goes down on patient's left side (when seated in the chair) so had to transfer to patient's R side due to limitations in set-up. Patient will be able to transfer back to bed later with nursing staff to her left side/stronger side. Cues for hand and foot placement and sequencing.   Ambulation/Gait  General Gait Details not safe to attempt at this time due  to high INR and patient's decreased strength and balance  Balance  Overall balance assessment Needs assistance  Sitting-balance support Feet supported;Bilateral upper extremity supported  Sitting balance-Leahy Scale Fair  Postural control Posterior lean  Standing balance comment not safe to attempt at this time  General Comments  General comments (skin integrity, edema, etc.) Patient on 5L [place] with O2 sat 96% at rest in bed, HR 117 bpm, RR 18. BP 137/68 post transfer to chair, HR 118 bpm. O2 saturation down to 92-93% sitting EOB.   PT - End of Session  Equipment Utilized During Treatment Gait belt  Activity Tolerance Patient limited by fatigue;Patient tolerated treatment well  Patient left in chair;with call bell/phone within reach;with chair alarm set  Nurse Communication Mobility status;Precautions  PT Assessment  PT Recommendation/Assessment Patient needs continued PT services  PT Visit Diagnosis Muscle weakness (generalized) (M62.81);Other abnormalities of gait and mobility (R26.89)  PT Problem List Decreased strength;Decreased activity tolerance;Decreased balance;Decreased mobility;Decreased knowledge of precautions;Cardiopulmonary status limiting activity;Impaired sensation;Decreased skin integrity  Barriers to Discharge Decreased caregiver support  PT Plan  PT Frequency (ACUTE ONLY) Min 2X/week  PT Treatment/Interventions (ACUTE ONLY) DME instruction;Gait training;Functional mobility training;Therapeutic activities;Therapeutic exercise;Balance training;Patient/family education;Wheelchair mobility training  AM-PAC PT "6 Clicks" Mobility Outcome Measure (Version 2)  Help needed turning from your back to your side while in a flat bed without using bedrails? 3  Help needed moving from lying on your back to sitting on the side of a flat bed without using bedrails? 2  Help needed moving to and from a bed to a chair  (including a wheelchair)? 2  Help needed standing up from a chair using your arms (e.g., wheelchair or bedside chair)? 1  Help needed to walk in hospital room? 1  Help needed climbing 3-5 steps with a railing?  1  6 Click Score 10  Consider Recommendation of Discharge To: CIR/SNF/LTACH  PT Recommendation  Follow Up Recommendations SNF  PT equipment Other (comment);Wheelchair cushion (measurements PT);Wheelchair (measurements PT);3in1 (PT) ((drop arm), TBD)  Acute Rehab PT Goals  Time For Goal Achievement 11/07/19  Potential to Achieve Goals Fair  PT Time Calculation  PT Start Time (ACUTE ONLY) 0901  PT Stop Time (ACUTE ONLY) [id number]  PT Time Calculation (min) (ACUTE ONLY) 36 min  PT General Charges  $$ ACUTE PT VISIT 1 Visit  PT Evaluation  $PT Eval Moderate Complexity 1 Mod   Birdie Hopes, PT, DPT Acute Rehab [phone] office

## 2019-10-25 NOTE — Progress Notes (Signed)
CRITICAL VALUE ALERT  Critical Value:  INR 5.3  Date & Time Notied:  10/25/19 0735  Provider Notified: Dr. Sloan Leiter paged.  Orders Received/Actions taken: Pending.

## 2019-10-25 NOTE — Progress Notes (Signed)
Progress Note  Patient Name: [name] Date of Encounter: 10/25/2019  Primary Cardiologist: Fransico Him, MD   Subjective   NO CP  Breathing OK (on O2; pt ont on O2 at home )  Inpatient Medications    Scheduled Meds: . bacitracin   Topical Daily  . citalopram  10 mg Oral Daily  . fluticasone furoate-vilanterol  1 puff Inhalation Daily  . furosemide  20 mg Oral Daily  . latanoprost  1 drop Both Eyes QHS  . metoprolol tartrate  12.5 mg Oral BID  . montelukast  10 mg Oral QHS  . pilocarpine  1 drop Both Eyes BID  . potassium chloride  40 mEq Oral Once  . sodium chloride flush  3 mL Intravenous Q12H  . Warfarin - Pharmacist Dosing Inpatient   Does not apply q1600   Continuous Infusions: . ceFEPime (MAXIPIME) IV 2 g (10/25/19 0510)  . vancomycin Stopped (10/25/19 0600)   PRN Meds: acetaminophen **OR** acetaminophen, albuterol, ALPRAZolam, diphenhydrAMINE, fluticasone, ondansetron **OR** ondansetron (ZOFRAN) IV, Resource ThickenUp Clear   Vital Signs    Vitals:   10/25/19 0400 10/25/19 0508 10/25/19 0517 10/25/19 0527  BP: (!) 115/58     Pulse: (!) 106   97  Resp: (!) 25   (!) 25  Temp:  98.7 F (37.1 C)    TempSrc:  Oral    SpO2: 98%   96%  Weight:   63.1 kg   Height:        Intake/Output Summary (Last 24 hours) at 10/25/2019 0806 Last data filed at 10/25/2019 0600 Gross per 24 hour  Intake 525 ml  Output --  Net 525 ml   Last 3 Weights 10/25/2019 10/24/2019 10/23/2019  Weight (lbs) 139 lb 1.8 oz 140 lb 6.9 oz 141 lb 5 oz  Weight (kg) 63.1 kg 63.7 kg 64.1 kg      Telemetry    Afib  90s to 100s  - Personally Reviewed  ECG     - Personally Reviewed  Physical Exam   GEN: No acute distress.   Neck: No JVD Cardiac: Irreg irreg  no murmurs Respiratory: Mild rhonchi. GI: Soft, nontender, non-distended  MS: Tr edema . Neuro:  Nonfocal  Psych: Normal affect   Labs    High Sensitivity Troponin:   Recent Labs  Lab 10/21/19 0319  10/21/19 0613  TROPONINIHS 4 6      Chemistry Recent Labs  Lab 10/21/19 0319 10/22/19 0526 10/23/19 0421 10/24/19 0357 10/25/19 0408  NA 134*   < > 138 139 142  K 3.6   < > 3.7 3.6 3.4*  CL 98   < > 102 102 106  CO2 24   < > 26 28 29   GLUCOSE 143*   < > 91 96 92  BUN 26*   < > 24* 21 15  CREATININE 1.24*   < > 1.01* 0.89 0.75  CALCIUM 8.5*   < > 8.5* 8.4* 8.2*  PROT 6.7  --  5.9*  --   --   ALBUMIN 3.2*  --  2.3*  --   --   AST 24  --  24  --   --   ALT 16  --  17  --   --   ALKPHOS 62  --  68  --   --   BILITOT 1.0  --  0.6  --   --   GFRNONAA 37*   < > 48* 56* >60  GFRAA 43*   < >  56* >60 >60  ANIONGAP 12   < > 10 9 7    < > = values in this interval not displayed.     Hematology Recent Labs  Lab 10/23/19 0421 10/24/19 0357 10/25/19 0408  WBC 10.2 8.8 5.8  RBC 2.43* 2.60* 2.57*  HGB 7.9* 8.4* 8.3*  HCT 24.3* 25.9* 25.7*  MCV 100.0 99.6 100.0  MCH 32.5 32.3 32.3  MCHC 32.5 32.4 32.3  RDW 19.6* 19.3* 19.3*  PLT PLATELET CLUMPS NOTED ON SMEAR, UNABLE TO ESTIMATE 128* 128*    BNP Recent Labs  Lab 10/21/19 0319  BNP 275.2*     DDimer No results for input(s): DDIMER in the last 168 hours.   Radiology    DG Foot Complete Left  Result Date: 10/23/2019 CLINICAL DATA:  86 year old female with ulceration of the left foot. EXAM: LEFT FOOT - COMPLETE 3+ VIEW COMPARISON:  None. FINDINGS: There is no acute fracture or dislocation. Prior pinning of the second and third metatarsal heads with chronic fracture and nonunion of the second metatarsal head adjacent to the surgical pain. The surgical pain of the second metatarsal extends outside of the bone and does not fix the fracture. There is moderate degenerative changes of the first MTP joint with mild hallux valgus. The bones are osteopenic. Mild diffuse subcutaneous edema. No radiopaque foreign object or soft tissue gas. IMPRESSION: 1. No acute fracture or dislocation. 2. Prior pinning of the second and third  metatarsal heads with chronic fracture and nonunion of the second metatarsal head. Electronically Signed   By: Anner Crete M.D.   On: 10/23/2019 15:26   DG Swallowing Func-Speech Pathology  Result Date: 10/23/2019 Objective Swallowing Evaluation: Type of Study: MBS-Modified Barium Swallow Study  Patient Details Name: [name] MRN: [id number] Date of Birth: Nov 06, 1923 Today's Date: 10/23/2019 Time: SLP Start Time (ACUTE ONLY): 0850 -SLP Stop Time (ACUTE ONLY): 0920 SLP Time Calculation (min) (ACUTE ONLY): 30 min Past Medical History: Past Medical History: Diagnosis Date . Anxiety  . Arthritis  . CAD (coronary artery disease) 2007  a. 1997 s/p PCI of diagonal. . Constipation  . COPD/Asthma  . Diverticulosis  . Esophageal reflux  . Family history of adverse reaction to anesthesia   daughter had trouble breathing after surgery, had to be reintubated . Glaucoma  . HTN (hypertension), benign  . Hyperlipidemia  . Malignant melanoma ([place])   a. 09/2012 s/p resection. . Neuropathy   lower legs . PAF (paroxysmal atrial fibrillation) ([facility])   a. intolerant to beta blockers due to asthma and intolerant to CCB due to rash;  b. CHA2DS2VASc = 6-->chronic coumadin;  c. 03/2014 Echo: EF 60-65%, Gr 1 DD, triv AI, mildly dil RV, PASP 74mmhg. [name] TIA (transient ischemic attack)   a. 02/2011 - chronic coumadin in setting of PAF. [name] Urinary, incontinence, stress female  Past Surgical History: Past Surgical History: Procedure Laterality Date . ABDOMINAL HYSTERECTOMY   . APPENDECTOMY   . BACK SURGERY    lumbar laminectomy . CHOLECYSTECTOMY   . COLONOSCOPY   . CORONARY ANGIOPLASTY WITH STENT PLACEMENT   . EYE SURGERY   . NASAL SINUS SURGERY   . VESICOVAGINAL FISTULA CLOSURE W/ TAH   . WEIL OSTEOTOMY Left 06/12/2016  Procedure: Left [place] and [place] Left [name], Weil Osteotomy 2nd and 3rd Metatarsal;  Surgeon: Newt Minion, MD;  Location: [facility];  Service: Orthopedics;  Laterality: Left; HPI: Pt is a 86 y.o. female with  medical history significant of HTN, HLD, persistent  atrial fibrillation on Coumadin, CAD s/p stent, COPD/asthma, and anxiety who presented with complaints of shortness of breath. CT chest: Intermediate sized bilateral pleural effusions with left basilar atelectasis. Pt was noted to have "issues" swallowing pills in ED.  No data recorded Assessment / Plan / Recommendation CHL IP CLINICAL IMPRESSIONS 10/23/2019 Clinical Impression Pt demonstrates moderate pharyngeal dysphagia with silent aspriation of thin and nectar thick bolus during the swallow due to slightly late/incompletel aryngeal closure. A chin tuck was not beneficial in preventing aspiration, but controlling bolus size with spoon or small cups sips instead of straws did not result in aspiration with nectar thick liquids. Teaching a supra glottic swallow or supersupraglottic swallow are potential areas to investigate in therapy if warranted, though strategy was too comlpex for initial attempts in [facility] suite. Recommend pt initaite a dys 3 mech soft diet with nectar thick liquids, no straw. Will need further discussion with pt and family regarding wishes and long term plan. There is only slightly decreased risk of aspiration with nectar and pt may wish to consume thin liquids accepting risk.  SLP Visit Diagnosis Dysphagia, pharyngeal phase (R13.13) Attention and concentration deficit following -- Frontal lobe and executive function deficit following -- Impact on safety and function Moderate aspiration risk   CHL IP TREATMENT RECOMMENDATION 10/23/2019 Treatment Recommendations F/U MBS in --- days (Comment)   Prognosis 10/23/2019 Prognosis for Safe Diet Advancement Good Barriers to Reach Goals -- Barriers/Prognosis Comment -- CHL IP DIET RECOMMENDATION 10/23/2019 SLP Diet Recommendations Dysphagia 3 (Mech soft) solids;Nectar thick liquid Liquid Administration via No straw;Cup;Spoon Medication Administration Whole meds with puree Compensations Small sips/bites;Slow  rate Postural Changes Seated upright at 90 degrees   CHL IP OTHER RECOMMENDATIONS 10/23/2019 Recommended Consults -- Oral Care Recommendations Oral care BID Other Recommendations Order thickener from pharmacy   CHL IP FOLLOW UP RECOMMENDATIONS 10/23/2019 Follow up Recommendations Other (comment)   CHL IP FREQUENCY AND DURATION 10/23/2019 Speech Therapy Frequency (ACUTE ONLY) min 2x/week Treatment Duration 2 weeks      CHL IP ORAL PHASE 10/23/2019 Oral Phase WFL Oral - Pudding Teaspoon -- Oral - Pudding Cup -- Oral - Honey Teaspoon -- Oral - Honey Cup -- Oral - Nectar Teaspoon -- Oral - Nectar Cup -- Oral - Nectar Straw -- Oral - Thin Teaspoon -- Oral - Thin Cup -- Oral - Thin Straw -- Oral - Puree -- Oral - Mech Soft -- Oral - Regular -- Oral - Multi-Consistency -- Oral - Pill -- Oral Phase - Comment --  CHL IP PHARYNGEAL PHASE 10/23/2019 Pharyngeal Phase Impaired Pharyngeal- Pudding Teaspoon -- Pharyngeal -- Pharyngeal- Pudding Cup -- Pharyngeal -- Pharyngeal- Honey Teaspoon -- Pharyngeal -- Pharyngeal- Honey Cup -- Pharyngeal -- Pharyngeal- Nectar Teaspoon Pharyngeal residue - pyriform Pharyngeal -- Pharyngeal- Nectar Cup Pharyngeal residue - pyriform;WFL Pharyngeal -- Pharyngeal- Nectar Straw Penetration/Aspiration during swallow;Trace aspiration;Reduced airway/laryngeal closure Pharyngeal Material enters airway, passes BELOW cords without attempt by patient to eject out (silent aspiration) Pharyngeal- Thin Teaspoon Penetration/Aspiration during swallow;Penetration/Aspiration before swallow;Reduced airway/laryngeal closure;Trace aspiration Pharyngeal Material enters airway, passes BELOW cords without attempt by patient to eject out (silent aspiration) Pharyngeal- Thin Cup Penetration/Aspiration during swallow;Reduced airway/laryngeal closure;Trace aspiration Pharyngeal Material enters airway, passes BELOW cords without attempt by patient to eject out (silent aspiration) Pharyngeal- Thin Straw -- Pharyngeal --  Pharyngeal- Puree WFL Pharyngeal -- Pharyngeal- Mechanical Soft WFL Pharyngeal -- Pharyngeal- Regular -- Pharyngeal -- Pharyngeal- Multi-consistency -- Pharyngeal -- Pharyngeal- Pill -- Pharyngeal -- Pharyngeal Comment --  No flowsheet data found. [place],  Katherene Ponto 10/23/2019, 2:58 PM               Cardiac Studies   10/22/19  1. Left ventricular ejection fraction, by estimation, is 50 to 55%. The left ventricle has low normal function. The left ventricle has no regional wall motion abnormalities. There is mild left ventricular hypertrophy. Left ventricular diastolic parameters are indeterminate. 2. Right ventricular systolic function is mildly reduced. The right ventricular size is moderately enlarged. There is moderately elevated pulmonary artery systolic pressure. The estimated right ventricular systolic pressure is [id number]. 3. Left atrial size was severely dilated. 4. Right atrial size was severely dilated. 5. Moderate pleural effusion in the left lateral region. 6. The mitral valve is normal in structure. Trivial mitral valve regurgitation. 7. The tricuspid valve is abnormal. Tricuspid valve regurgitation is severe. 8. The aortic valve is tricuspid. Aortic valve regurgitation is not visualized. Mild to moderate aortic valve sclerosis/calcification is present, without any evidence of aortic stenosis. 9. Aortic dilatation noted. There is dilatation of the ascending aorta measuring 42 mm. 10. The inferior vena cava is dilated in size with <50% respiratory variability, suggesting right atrial pressure of 15 mmHg.  Patient Profile        [name] is a 86 y.o. female with a hx of CAD s/p PCI to [place], permanent atrial fibrillation on Coumadin, HTN, HLD, COPD and AAA who is being seen today for the evaluation of preoperative cardiac risk in the setting of right calcaneus osteomyelitis at the request of [place].   Assessment & Plan    1   Atrial fibrillation   Permanent  WOuld increase metoprolol for better rate control Coumadin held   INR is still high   WIll need reversing if plans for surgery    2  Acute on chronic diastolic CHF  Will give Zaroxylyn this am to see if get better response   3  Cardiac risk stratification   Note plans for surgery tomorrow given nonhealing infection. Pt is at least a mod risk, if not high for planned procedure  Not prohibititve    Avoid hypotenision, control rates with intravenous meds    Watch I/O WIll continue to follow perioperatively  For questions or updates, please contact [place] Please consult [email or web address] for contact info under        Signed, Dorris Carnes, MD  10/25/2019, 8:06 AM

## 2019-10-25 NOTE — Progress Notes (Addendum)
PROGRESS NOTE        PATIENT DETAILS Name: [name] Age: 86 y.o. Sex: female Date of Birth: [birth year] Admit Date: 10/21/2019 Admitting Physician Norval Morton, MD OO:[id number], Lenna Sciara, PA-C  Brief Narrative: Patient is a 86 y.o. female with history of chronic atrial fibrillation-on Coumadin, chronic diastolic heart failure, CAD history of remote PCI, COPD/asthma overlap presented with shortness of breath-thought to be secondary to decompensated diastolic heart failure, A. fib with RVR-and also found to have osteomyelitis of the right calcaneus.  See below for further details.  Significant events: 4/17>> admit to [facility]-osteomyelitis of right calcaneus, A. fib with RVR, decompensated heart failure and coagulopathy 4/18>> TTE-EF [id number], RV systolic function mildly reduced-RVSP 55.2  Antimicrobial therapy: Flagyl 4/21>> Cefepime: 4/20>> Vancomycin 4/17>> 4/20 Rocephin 4/17>> 4/20  Microbiology data: 4/7>> blood culture: Negative  Procedures : None  Consults: Orthopedics  DVT Prophylaxis : Supratherapeutic INR-Coumadin on hold  Subjective: Lying comfortably in bed.  No major issues overnight.  BKA scheduled for tomorrow.  Assessment/Plan: SIRS secondary to right lower extremity soft tissue infection with osteomyelitis and resultant pathological fracture of the right calcaneus: SIRS physiology has resolved.  Right leg with less erythema and swelling.  After extensive discussion with patient/family-BKA tentatively scheduled for tomorrow.  Appreciate cardiac input regarding perioperative risk.  Remains on IV antibiotics-see above  Gram-negative bacteremia: 1 out of 2 blood cultures positive for gram-negative rods-switched to cefepime on 4/20-per pharmacy-this could be a anaerobic organism-hence Flagyl added on 4/21.  Await final culture results.  Chronic atrial fibrillation with RVR: Heart rate controlled-she seems to be tolerating  metoprolol well.  She did have transient hypotension on admission but has not reoccurred since then.  .   Acute hypoxic respiratory failure secondary to decompensated diastolic heart failure: Volume status proved-BP soft-continue Lasix 20 mg today.  Will reassess tomorrow.   Supratherapeutic INR: Secondary to Coumadin use-no evidence of bleeding-Coumadin on hold-since INR remains supratherapeutic-we will give vitamin K today and recheck INR in the evening-patient is scheduled for BKA tomorrow.  AKI: Improved-likely hemodynamically mediated  Transient hypotension: Likely secondary to medication use-monitor for now.  BP now stable.  Pulmonary hypertension: Likely secondary to COPD.  Doubt further work-up/invasive therapy will change management or outcome in this frail elderly patient.   COPD/asthma overlap: Some scattered wheezing today-stable and not in exacerbation-continue bronchodilators-encourage incentive spirometry  Anemia: Suspect has chronic anemia due to osteomyelitis/chronic inflammation-worsened by acute illness.  No indication for transfusion-follow for now.  Anxiety: Appears stable-continue as needed Ativan  Dysphagia: Chronic issue-appreciate SLP evaluation  4.5 cm ascending aortic aneurysm seen on CT chest: Stable for outpatient follow-up  Diet: Diet Order            DIET DYS 3 Room service appropriate? No; Fluid consistency: Nectar Thick  Diet effective now               Code Status:  DNR  Family Communication: Daughter over the phone on 4/21  Disposition Plan:  Home health vs SNF when ready for discharge  Barriers to Discharge: Right leg soft tissue infection with osteomyelitis requiring IV antibiotics along with decompensated heart failure.   Antimicrobial agents: Anti-infectives (From admission, onward)   Start     Dose/Rate Route Frequency Ordered Stop   10/25/19 1800  ceFEPIme (MAXIPIME) 2 g in sodium chloride 0.9 % 100 mL  IVPB     2 g 200 mL/hr  over 30 Minutes Intravenous Every 12 hours 10/25/19 0854     10/25/19 0845  metroNIDAZOLE (FLAGYL) tablet 500 mg     500 mg Oral Every 8 hours 10/25/19 0840     10/24/19 2200  vancomycin (VANCOCIN) IVPB 1000 mg/200 mL premix  Status:  Discontinued     1,000 mg 100 mL/hr over 120 Minutes Intravenous Every 36 hours 10/24/19 1013 10/25/19 0840   10/24/19 1645  ceFEPIme (MAXIPIME) 2 g in sodium chloride 0.9 % 100 mL IVPB  Status:  Discontinued     2 g 200 mL/hr over 30 Minutes Intravenous Every 8 hours 10/24/19 1644 10/25/19 0854   10/23/19 0800  vancomycin (VANCOCIN) IVPB 1000 mg/200 mL premix  Status:  Discontinued     1,000 mg 100 mL/hr over 120 Minutes Intravenous Every 48 hours 10/21/19 0652 10/24/19 1013   10/22/19 0800  cefTRIAXone (ROCEPHIN) 2 g in sodium chloride 0.9 % 100 mL IVPB  Status:  Discontinued     2 g 200 mL/hr over 30 Minutes Intravenous Every 24 hours 10/21/19 0647 10/24/19 1644   10/21/19 0700  vancomycin (VANCOREADY) IVPB 1250 mg/250 mL     1,250 mg 83.3 mL/hr over 180 Minutes Intravenous  Once 10/21/19 0630 10/21/19 1140   10/21/19 0700  cefTRIAXone (ROCEPHIN) 1 g in sodium chloride 0.9 % 100 mL IVPB     1 g 200 mL/hr over 30 Minutes Intravenous  Once 10/21/19 0647 10/21/19 1225   10/21/19 0515  cefTRIAXone (ROCEPHIN) 1 g in sodium chloride 0.9 % 100 mL IVPB     1 g 200 mL/hr over 30 Minutes Intravenous  Once 10/21/19 0501 10/21/19 0646       Time spent: 25 minutes-Greater than 50% of this time was spent in counseling, explanation of diagnosis, planning of further management, and coordination of care.  MEDICATIONS: Scheduled Meds: . bacitracin   Topical Daily  . citalopram  10 mg Oral Daily  . fluticasone furoate-vilanterol  1 puff Inhalation Daily  . furosemide  20 mg Oral Daily  . latanoprost  1 drop Both Eyes QHS  . levalbuterol  0.63 mg Nebulization TID  . metoprolol tartrate  25 mg Oral TID  . metroNIDAZOLE  500 mg Oral Q8H  . montelukast  10 mg Oral  QHS  . pilocarpine  1 drop Both Eyes BID  . sodium chloride flush  3 mL Intravenous Q12H  . Warfarin - Pharmacist Dosing Inpatient   Does not apply q1600   Continuous Infusions: . ceFEPime (MAXIPIME) IV     PRN Meds:.acetaminophen **OR** acetaminophen, albuterol, ALPRAZolam, diphenhydrAMINE, fluticasone, ondansetron **OR** ondansetron (ZOFRAN) IV, Resource ThickenUp Clear   PHYSICAL EXAM: Vital signs: Vitals:   10/25/19 0508 10/25/19 0517 10/25/19 0527 10/25/19 0845  BP:    108/61  Pulse:   97 93  Resp:   (!) 25 (!) 24  Temp: 98.7 F (37.1 C)   98.1 F (36.7 C)  TempSrc: Oral   Oral  SpO2:   96% 96%  Weight:  63.1 kg    Height:       Filed Weights   10/23/19 0340 10/24/19 0350 10/25/19 0517  Weight: 64.1 kg 63.7 kg 63.1 kg   Body mass index is 23.15 kg/m.   Gen Exam:Alert awake-not in any distress HEENT:atraumatic, normocephalic Chest: B/L clear to auscultation anteriorly-few scattered rhonchi. CVS:S1S2 regular Abdomen:soft non tender, non distended Extremities:no edema Neurology: Non focal Skin: no rash  I have  personally reviewed following labs and imaging studies  LABORATORY DATA: CBC: Recent Labs  Lab 10/21/19 0319 10/22/19 0526 10/23/19 0421 10/24/19 0357 10/25/19 0408  WBC 8.4 10.0 10.2 8.8 5.8  NEUTROABS 8.0*  --   --   --   --   HGB 8.3* 7.7* 7.9* 8.4* 8.3*  HCT 26.1* 23.9*  22.7* 24.3* 25.9* 25.7*  MCV 100.4* 100.4* 100.0 99.6 100.0  PLT 121* PLATELET CLUMPS NOTED ON SMEAR, UNABLE TO ESTIMATE PLATELET CLUMPS NOTED ON SMEAR, UNABLE TO ESTIMATE 128* 128*    Basic Metabolic Panel: Recent Labs  Lab 10/21/19 0319 10/22/19 0526 10/23/19 0421 10/24/19 0357 10/25/19 0408  NA 134* 137 138 139 142  K 3.6 3.7 3.7 3.6 3.4*  CL 98 103 102 102 106  CO2 24 26 26 28 29   GLUCOSE 143* 69* 91 96 92  BUN 26* 22 24* 21 15  CREATININE 1.24* 1.02* 1.01* 0.89 0.75  CALCIUM 8.5* 8.2* 8.5* 8.4* 8.2*  MG  --  1.8 2.2  --   --     GFR: Estimated  Creatinine Clearance: 39.5 mL/min (by C-G formula based on SCr of 0.75 mg/dL).  Liver Function Tests: Recent Labs  Lab 10/21/19 0319 10/23/19 0421  AST 24 24  ALT 16 17  ALKPHOS 62 68  BILITOT 1.0 0.6  PROT 6.7 5.9*  ALBUMIN 3.2* 2.3*   No results for input(s): LIPASE, AMYLASE in the last 168 hours. No results for input(s): AMMONIA in the last 168 hours.  Coagulation Profile: Recent Labs  Lab 10/21/19 0319 10/22/19 0526 10/23/19 0421 10/24/19 0357 10/25/19 0408  INR 9.3* 8.2* 6.6* 6.0* 5.3*    Cardiac Enzymes: No results for input(s): CKTOTAL, CKMB, CKMBINDEX, TROPONINI in the last 168 hours.  BNP (last 3 results) No results for input(s): PROBNP in the last 8760 hours.  Lipid Profile: No results for input(s): CHOL, HDL, LDLCALC, TRIG, CHOLHDL, LDLDIRECT in the last 72 hours.  Thyroid Function Tests: No results for input(s): TSH, T4TOTAL, FREET4, T3FREE, THYROIDAB in the last 72 hours.  Anemia Panel: No results for input(s): VITAMINB12, FOLATE, FERRITIN, TIBC, IRON, RETICCTPCT in the last 72 hours.  Urine analysis:    Component Value Date/Time   COLORURINE YELLOW 04/05/2018 1344   APPEARANCEUR CLEAR 04/05/2018 1344   LABSPEC 1.009 04/05/2018 1344   PHURINE 6.0 04/05/2018 1344   GLUCOSEU NEGATIVE 04/05/2018 1344   HGBUR NEGATIVE 04/05/2018 1344   BILIRUBINUR NEGATIVE 04/05/2018 1344   KETONESUR NEGATIVE 04/05/2018 1344   PROTEINUR NEGATIVE 04/05/2018 1344   UROBILINOGEN 0.2 03/05/2015 1600   NITRITE NEGATIVE 04/05/2018 1344   LEUKOCYTESUR NEGATIVE 04/05/2018 1344    Sepsis Labs: Lactic Acid, Venous    Component Value Date/Time   LATICACIDVEN 0.9 10/21/2019 2111    MICROBIOLOGY: Recent Results (from the past 240 hour(s))  Blood culture (routine x 2)     Status: None (Preliminary result)   Collection Time: 10/21/19  6:13 AM   Specimen: BLOOD  Result Value Ref Range Status   Specimen Description BLOOD RIGHT ANTECUBITAL  Final   Special Requests    Final    BOTTLES DRAWN AEROBIC AND ANAEROBIC Blood Culture results may not be optimal due to an excessive volume of blood received in culture bottles   Culture  Setup Time   Final    GRAM NEGATIVE RODS ANAEROBIC BOTTLE ONLY CRITICAL RESULT CALLED TO, READ BACK BY AND VERIFIED WITH: K. Hillery Aldo PHARMD, AT [id number] 10/25/19 BY Rush Landmark Performed at [facility], [address] [place], [place] [address]    Culture GRAM NEGATIVE RODS  Final   Report Status PENDING  Incomplete  Blood culture (routine x 2)     Status: None (Preliminary result)   Collection Time: 10/21/19  6:13 AM   Specimen: BLOOD RIGHT WRIST  Result Value Ref Range Status   Specimen Description BLOOD RIGHT WRIST  Final   Special Requests   Final    BOTTLES DRAWN AEROBIC AND ANAEROBIC Blood Culture adequate volume   Culture   Final    NO GROWTH 4 DAYS Performed at [facility], [place] [address], [place], [place] [address]    Report Status PENDING  Incomplete  Blood Culture ID Panel (Reflexed)     Status: None   Collection Time: 10/21/19  6:13 AM  Result Value Ref Range Status   Enterococcus species NOT DETECTED NOT DETECTED Final   Listeria monocytogenes NOT DETECTED NOT DETECTED Final   Staphylococcus species NOT DETECTED NOT DETECTED Final   Staphylococcus aureus (BCID) NOT DETECTED NOT DETECTED Final   Streptococcus species NOT DETECTED NOT DETECTED Final   Streptococcus agalactiae NOT DETECTED NOT DETECTED Final   Streptococcus pneumoniae NOT DETECTED NOT DETECTED Final   Streptococcus pyogenes NOT DETECTED NOT DETECTED Final   Acinetobacter baumannii NOT DETECTED NOT DETECTED Final   Enterobacteriaceae species NOT DETECTED NOT DETECTED Final   Enterobacter cloacae complex NOT DETECTED NOT DETECTED Final   Escherichia coli NOT DETECTED NOT DETECTED Final   Klebsiella oxytoca NOT DETECTED NOT DETECTED Final   Klebsiella pneumoniae NOT DETECTED NOT DETECTED Final   Proteus species NOT DETECTED NOT DETECTED  Final   Serratia marcescens NOT DETECTED NOT DETECTED Final   Haemophilus influenzae NOT DETECTED NOT DETECTED Final   Neisseria meningitidis NOT DETECTED NOT DETECTED Final   Pseudomonas aeruginosa NOT DETECTED NOT DETECTED Final   Candida albicans NOT DETECTED NOT DETECTED Final   Candida glabrata NOT DETECTED NOT DETECTED Final   Candida krusei NOT DETECTED NOT DETECTED Final   Candida parapsilosis NOT DETECTED NOT DETECTED Final   Candida tropicalis NOT DETECTED NOT DETECTED Final    Comment: Performed at [facility], [place] [address], [place], [place] [address]  SARS CORONAVIRUS 2 (TAT 6-24 HRS) Nasopharyngeal Nasopharyngeal Swab     Status: None   Collection Time: 10/21/19  7:30 AM   Specimen: Nasopharyngeal Swab  Result Value Ref Range Status   SARS Coronavirus 2 NEGATIVE NEGATIVE Final    Comment: (NOTE) SARS-CoV-2 target nucleic acids are NOT DETECTED. The SARS-CoV-2 RNA is generally detectable in upper and lower respiratory specimens during the acute phase of infection. Negative results do not preclude SARS-CoV-2 infection, do not rule out co-infections with other pathogens, and should not be used as the sole basis for treatment or other patient management decisions. Negative results must be combined with clinical observations, patient history, and epidemiological information. The expected result is Negative. Fact Sheet for Patients: SugarRoll.be Fact Sheet for Healthcare Providers: https://www.woods-mathews.com/ This test is not yet approved or cleared by the [place] FDA and  has been authorized for detection and/or diagnosis of SARS-CoV-2 by FDA under an Emergency Use Authorization (EUA). This EUA will remain  in effect (meaning this test can be used) for the duration of the COVID-19 declaration under Section 56 4(b)(1) of the Act, 21 U.S.C. section 360bbb-3(b)(1), unless the authorization is terminated or revoked  sooner. Performed at [facility], [place] [address] [place],  [address]  RADIOLOGY STUDIES/RESULTS: No results found.   LOS: 4 days   Oren Binet, MD  [facility]    To contact the attending provider between 7A-7P or the covering provider during after hours 7P-7A, please log into the web site [email or web address] and access using universal [place] password for that web site. If you do not have the password, please call the hospital operator.  10/25/2019, 1:50 PM

## 2019-10-26 ENCOUNTER — Inpatient Hospital Stay (HOSPITAL_COMMUNITY): Payer: Medicare HMO | Admitting: Certified Registered Nurse Anesthetist

## 2019-10-26 ENCOUNTER — Encounter (HOSPITAL_COMMUNITY)
Admission: EM | Disposition: A | Discharge status: Skilled Nursing Facility | Payer: Self-pay | Source: Home / Self Care | Attending: Internal Medicine

## 2019-10-26 DIAGNOSIS — M86171 Other acute osteomyelitis, right ankle and foot: Secondary | ICD-10-CM | POA: Diagnosis not present

## 2019-10-26 DIAGNOSIS — N179 Acute kidney failure, unspecified: Secondary | ICD-10-CM | POA: Diagnosis not present

## 2019-10-26 DIAGNOSIS — I4891 Unspecified atrial fibrillation: Secondary | ICD-10-CM | POA: Diagnosis not present

## 2019-10-26 DIAGNOSIS — R1310 Dysphagia, unspecified: Secondary | ICD-10-CM | POA: Diagnosis not present

## 2019-10-26 HISTORY — PX: AMPUTATION: SHX166

## 2019-10-26 LAB — CBC
HCT: 27.8 % — ABNORMAL LOW (ref 36.0–46.0)
Hemoglobin: 9.2 g/dL — ABNORMAL LOW (ref 12.0–15.0)
MCH: 32.3 pg (ref 26.0–34.0)
MCHC: 33.1 g/dL (ref 30.0–36.0)
MCV: 97.5 fL (ref 80.0–100.0)
Platelets: ADEQUATE 10*3/uL (ref 150–400)
RBC: 2.85 MIL/uL — ABNORMAL LOW (ref 3.87–5.11)
RDW: 18.6 % — ABNORMAL HIGH (ref 11.5–15.5)
WBC: 6.2 10*3/uL (ref 4.0–10.5)
nRBC: 0 % (ref 0.0–0.2)

## 2019-10-26 LAB — BASIC METABOLIC PANEL
Anion gap: 10 (ref 5–15)
BUN: 15 mg/dL (ref 8–23)
CO2: 35 mmol/L — ABNORMAL HIGH (ref 22–32)
Calcium: 8.5 mg/dL — ABNORMAL LOW (ref 8.9–10.3)
Chloride: 97 mmol/L — ABNORMAL LOW (ref 98–111)
Creatinine, Ser: 0.96 mg/dL (ref 0.44–1.00)
GFR calc Af Amer: 59 mL/min — ABNORMAL LOW (ref >60)
GFR calc non Af Amer: 51 mL/min — ABNORMAL LOW (ref >60)
Glucose, Bld: 97 mg/dL (ref 70–99)
Potassium: 3.5 mmol/L (ref 3.5–5.1)
Sodium: 142 mmol/L (ref 135–145)

## 2019-10-26 LAB — CULTURE, BLOOD (ROUTINE X 2)
Culture: NO GROWTH
Special Requests: ADEQUATE

## 2019-10-26 LAB — PROTIME-INR
INR: 2.1 — ABNORMAL HIGH (ref 0.8–1.2)
Prothrombin Time: 23.4 seconds — ABNORMAL HIGH (ref 11.4–15.2)

## 2019-10-26 LAB — SURGICAL PCR SCREEN
MRSA, PCR: NEGATIVE
Staphylococcus aureus: POSITIVE — AB

## 2019-10-26 SURGERY — AMPUTATION BELOW KNEE
Anesthesia: Regional | Site: Knee | Laterality: Right

## 2019-10-26 MED ORDER — ALBUTEROL SULFATE HFA 108 (90 BASE) MCG/ACT IN AERS
INHALATION_SPRAY | RESPIRATORY_TRACT | Status: AC
  Filled 2019-10-26: qty 6.7

## 2019-10-26 MED ORDER — ROCURONIUM BROMIDE 10 MG/ML (PF) SYRINGE
PREFILLED_SYRINGE | INTRAVENOUS | Status: AC
  Filled 2019-10-26: qty 20

## 2019-10-26 MED ORDER — ONDANSETRON HCL 4 MG/2ML IJ SOLN
INTRAMUSCULAR | Status: DC | PRN
  Administered 2019-10-26: 4 mg via INTRAVENOUS

## 2019-10-26 MED ORDER — ROPIVACAINE HCL 5 MG/ML IJ SOLN
INTRAMUSCULAR | Status: DC | PRN
  Administered 2019-10-26: 10 mL via PERINEURAL
  Administered 2019-10-26: 20 mL via PERINEURAL

## 2019-10-26 MED ORDER — MUPIROCIN 2 % EX OINT
1.0000 "application " | TOPICAL_OINTMENT | Freq: Two times a day (BID) | CUTANEOUS | Status: AC
  Administered 2019-10-26 – 2019-10-30 (×10): 1 via NASAL
  Filled 2019-10-26 (×2): qty 22

## 2019-10-26 MED ORDER — MEPIVACAINE HCL 1.5 % IJ SOLN
INTRAMUSCULAR | Status: DC | PRN
  Administered 2019-10-26: 10 mL via PERINEURAL

## 2019-10-26 MED ORDER — PHENYLEPHRINE 40 MCG/ML (10ML) SYRINGE FOR IV PUSH (FOR BLOOD PRESSURE SUPPORT)
PREFILLED_SYRINGE | INTRAVENOUS | Status: AC
  Filled 2019-10-26: qty 20

## 2019-10-26 MED ORDER — LACTATED RINGERS IV SOLN: INTRAVENOUS | Status: DC

## 2019-10-26 MED ORDER — ONDANSETRON HCL 4 MG/2ML IJ SOLN
INTRAMUSCULAR | Status: AC
  Filled 2019-10-26: qty 2

## 2019-10-26 MED ORDER — 0.9 % SODIUM CHLORIDE (POUR BTL) OPTIME
TOPICAL | Status: DC | PRN
  Administered 2019-10-26: 1000 mL

## 2019-10-26 MED ORDER — CHLORHEXIDINE GLUCONATE CLOTH 2 % EX PADS
6.0000 | MEDICATED_PAD | Freq: Every day | CUTANEOUS | Status: DC
  Administered 2019-10-26 – 2019-10-31 (×5): 6 via TOPICAL

## 2019-10-26 MED ORDER — FUROSEMIDE 20 MG PO TABS
20.0000 mg | ORAL_TABLET | Freq: Every day | ORAL | Status: DC
  Administered 2019-10-27 – 2019-10-31 (×5): 20 mg via ORAL
  Filled 2019-10-26 (×5): qty 1

## 2019-10-26 MED ORDER — FUROSEMIDE 10 MG/ML IJ SOLN
40.0000 mg | Freq: Once | INTRAMUSCULAR | Status: AC
  Administered 2019-10-26: 40 mg via INTRAVENOUS
  Filled 2019-10-26: qty 4

## 2019-10-26 MED ORDER — FENTANYL CITRATE (PF) 100 MCG/2ML IJ SOLN
INTRAMUSCULAR | Status: DC | PRN
  Administered 2019-10-26: 25 ug via INTRAVENOUS

## 2019-10-26 MED ORDER — SODIUM CHLORIDE 0.9 % IV SOLN
2.0000 g | INTRAVENOUS | Status: DC
  Administered 2019-10-26 – 2019-10-30 (×5): 2 g via INTRAVENOUS
  Filled 2019-10-26 (×5): qty 20

## 2019-10-26 MED ORDER — PROPOFOL 10 MG/ML IV BOLUS
INTRAVENOUS | Status: AC
  Filled 2019-10-26: qty 20

## 2019-10-26 MED ORDER — FENTANYL CITRATE (PF) 100 MCG/2ML IJ SOLN: 50.0000 ug | Freq: Once | INTRAMUSCULAR | Status: AC

## 2019-10-26 MED ORDER — PHENYLEPHRINE HCL (PRESSORS) 10 MG/ML IV SOLN
INTRAVENOUS | Status: DC | PRN
  Administered 2019-10-26 (×3): 80 ug via INTRAVENOUS
  Administered 2019-10-26 (×2): 120 ug via INTRAVENOUS

## 2019-10-26 MED ORDER — PHENYLEPHRINE 40 MCG/ML (10ML) SYRINGE FOR IV PUSH (FOR BLOOD PRESSURE SUPPORT)
PREFILLED_SYRINGE | INTRAVENOUS | Status: AC
  Filled 2019-10-26: qty 10

## 2019-10-26 MED ORDER — FENTANYL CITRATE (PF) 250 MCG/5ML IJ SOLN
INTRAMUSCULAR | Status: AC
  Filled 2019-10-26: qty 5

## 2019-10-26 MED ORDER — PROPOFOL 500 MG/50ML IV EMUL
INTRAVENOUS | Status: DC | PRN
  Administered 2019-10-26: 50 ug/kg/min via INTRAVENOUS

## 2019-10-26 MED ORDER — LIDOCAINE 2% (20 MG/ML) 5 ML SYRINGE
INTRAMUSCULAR | Status: AC
  Filled 2019-10-26: qty 10

## 2019-10-26 MED ORDER — FENTANYL CITRATE (PF) 100 MCG/2ML IJ SOLN
INTRAMUSCULAR | Status: AC
  Administered 2019-10-26: 50 ug via INTRAVENOUS
  Filled 2019-10-26: qty 2

## 2019-10-26 SURGICAL SUPPLY — 42 items
BLADE SAW RECIP 87.9 MT (BLADE) ×3 IMPLANT
BLADE SAW SGTL HD 18.5X60.5X1. (BLADE) ×2 IMPLANT
BLADE SURG 21 STRL SS (BLADE) ×3 IMPLANT
BNDG CMPR MED 10X6 ELC LF (GAUZE/BANDAGES/DRESSINGS) ×1
BNDG COHESIVE 6X5 TAN STRL LF (GAUZE/BANDAGES/DRESSINGS) IMPLANT
BNDG ELASTIC 6X10 VLCR STRL LF (GAUZE/BANDAGES/DRESSINGS) ×2 IMPLANT
CANISTER WOUND CARE 500ML ATS (WOUND CARE) ×3 IMPLANT
COVER SURGICAL LIGHT HANDLE (MISCELLANEOUS) ×3 IMPLANT
COVER WAND RF STERILE (DRAPES) IMPLANT
CUFF TOURN SGL QUICK 34 (TOURNIQUET CUFF) ×3
CUFF TRNQT CYL 34X4.125X (TOURNIQUET CUFF) ×1 IMPLANT
DRAPE INCISE IOBAN 66X45 STRL (DRAPES) ×3 IMPLANT
DRAPE U-SHAPE 47X51 STRL (DRAPES) ×3 IMPLANT
DURAPREP 26ML APPLICATOR (WOUND CARE) ×3 IMPLANT
ELECT REM PT RETURN 9FT ADLT (ELECTROSURGICAL) ×3
ELECTRODE REM PT RTRN 9FT ADLT (ELECTROSURGICAL) ×1 IMPLANT
GAUZE SPONGE 4X4 12PLY STRL LF (GAUZE/BANDAGES/DRESSINGS) ×2 IMPLANT
GAUZE XEROFORM 5X9 LF (GAUZE/BANDAGES/DRESSINGS) ×2 IMPLANT
GLOVE BIOGEL PI IND STRL 9 (GLOVE) ×1 IMPLANT
GLOVE BIOGEL PI INDICATOR 9 (GLOVE) ×2
GLOVE SURG ORTHO 9.0 STRL STRW (GLOVE) ×3 IMPLANT
GOWN STRL REUS W/ TWL XL LVL3 (GOWN DISPOSABLE) ×2 IMPLANT
GOWN STRL REUS W/TWL XL LVL3 (GOWN DISPOSABLE) ×6
KIT BASIN OR (CUSTOM PROCEDURE TRAY) ×3 IMPLANT
KIT TURNOVER KIT B (KITS) ×3 IMPLANT
MANIFOLD NEPTUNE II (INSTRUMENTS) ×3 IMPLANT
NS IRRIG 1000ML POUR BTL (IV SOLUTION) ×3 IMPLANT
PACK ORTHO EXTREMITY (CUSTOM PROCEDURE TRAY) ×3 IMPLANT
PAD ABD 8X10 STRL (GAUZE/BANDAGES/DRESSINGS) ×4 IMPLANT
PAD ARMBOARD 7.5X6 YLW CONV (MISCELLANEOUS) ×3 IMPLANT
PADDING CAST COTTON 6X4 STRL (CAST SUPPLIES) ×2 IMPLANT
SPONGE LAP 18X18 RF (DISPOSABLE) IMPLANT
STAPLER VISISTAT 35W (STAPLE) IMPLANT
STOCKINETTE IMPERVIOUS LG (DRAPES) ×3 IMPLANT
SUT ETHILON 2 0 PSLX (SUTURE) IMPLANT
SUT SILK 2 0 (SUTURE) ×3
SUT SILK 2-0 18XBRD TIE 12 (SUTURE) ×1 IMPLANT
SUT VIC AB 1 CTX 27 (SUTURE) ×6 IMPLANT
TOWEL GREEN STERILE (TOWEL DISPOSABLE) ×3 IMPLANT
TUBE CONNECTING 12'X1/4 (SUCTIONS) ×1
TUBE CONNECTING 12X1/4 (SUCTIONS) ×2 IMPLANT
YANKAUER SUCT BULB TIP NO VENT (SUCTIONS) ×3 IMPLANT

## 2019-10-26 NOTE — TOC Initial Note (Signed)
Transition of Care [facility]) - Initial/Assessment Note    Patient Details  Name: [name] MRN: [id number] Date of Birth: Sep 10, 1923  Transition of Care [facility]) CM/SW Contact:    Benard Halsted, LCSW Phone Number: 10/26/2019, 2:57 PM  Clinical Narrative:                 CSW received consult for possible SNF placement at time of discharge. CSW spoke with patient's daughter regarding PT recommendation of SNF placement at time of discharge. Patient's daughter requested CSW speak with patient's granddaughter, Colletta [place], for the decision-making process. Colletta [place] reported that they are requesting whatever is recommended by PT for patient at discharge. She expressed understanding of PT recommendation and is agreeable to SNF placement at time of discharge. CSW discussed insurance authorization process and will provide Medicare SNF ratings list. Patient has received both COVID vaccines so it is possible for SNF to allow her to have visitors. Stephanie requests team contact her for patient needs. Patient's family expressed being hopeful for rehab and to feel better soon. No further questions reported at this time. CSW to continue to follow and assist with discharge planning needs.   Expected Discharge Plan: Skilled Nursing Facility Barriers to Discharge: Continued Medical Work up   Patient Goals and CMS Choice Patient states their goals for this hospitalization and ongoing recovery are:: Rehab CMS Medicare.gov Compare Post Acute Care list provided to:: Patient Represenative (must comment)(Daughter and Granddaughter) Choice offered to / list presented to : Adult Children(and Granddaughter Colletta [place])  Expected Discharge Plan and Services Expected Discharge Plan: [place] In-house Referral: Clinical Social Work   Post Acute Care Choice: [place] Living arrangements for the past 2 months: [place]                                      Prior  Living Arrangements/Services Living arrangements for the past 2 months: Single Family Home Lives with:: Relatives(Granddaughter) Patient language and need for interpreter reviewed:: Yes Do you feel safe going back to the place where you live?: Yes      Need for Family Participation in Patient Care: Yes (Comment) Care giver support system in place?: Yes (comment) Current home services: DME Criminal Activity/Legal Involvement Pertinent to Current Situation/Hospitalization: No - Comment as needed  Activities of Daily Living      Permission Sought/Granted Permission sought to share information with : Facility Sport and exercise psychologist, Family Supports Permission granted to share information with : Yes, Verbal Permission Granted  Share Information with NAME: Colletta [place]  Permission granted to share info w AGENCY: SNFs  Permission granted to share info w Relationship: Granddaughter  Permission granted to share info w Contact Information: [phone]  Emotional Assessment Appearance:: Appears stated age Attitude/Demeanor/Rapport: Unable to Assess Affect (typically observed): Unable to Assess Orientation: : Oriented to Self, Oriented to Place, Oriented to Situation Alcohol / Substance Use: Not Applicable Psych Involvement: No (comment)  Admission diagnosis:  Atrial fibrillation with rapid ventricular response (HCC) [I48.91] Acute osteomyelitis of right foot (HCC) [M86.171] Cellulitis of right lower extremity [id number] Supratherapeutic INR [R79.1] Hypervolemia, unspecified hypervolemia type [E87.70] Osteomyelitis of right foot, unspecified type [facility]) [M86.9] Patient Active Problem List   Diagnosis Date Noted  . Acute osteomyelitis of right foot ([place]) 10/21/2019  . Macrocytic anemia 10/21/2019  . AKI (acute kidney injury) ([place]) 10/21/2019  . SIRS (systemic inflammatory response syndrome) ([place]) 10/21/2019  . Transient  hypotension 10/21/2019  . Dysphagia 10/21/2019  . AAA (abdominal  aortic aneurysm) ([place]) 10/21/2019  . Pressure injury of skin 08/09/2019  . Cellulitis 08/08/2019  . Elevated IgE level 07/27/2019  . Healthcare maintenance 07/27/2019  . Palpitation 01/28/2019  . Atrial fibrillation with RVR ([place]) 01/28/2019  . Pain in left knee 11/24/2018  . Pain in right knee 11/24/2018  . Closed fracture of distal end of left radius 08/29/2018  . On continuous oral anticoagulation   . Hip fracture ([place]) 04/19/2017  . Closed left hip fracture, initial encounter ([place]) 04/18/2017  . Ulcer of toe of left foot, with necrosis of bone ([place]) 10/08/2016  . Non-pressure chronic ulcer of other part of left foot limited to breakdown of skin ([place]) 09/24/2016  . Acute asthma exacerbation 09/21/2016  . Allergic rhinitis 09/21/2016  . Anxiety 09/21/2016  . Arthritis 09/21/2016  . Breath shortness 09/21/2016  . Glaucoma 09/21/2016  . Adult hypothyroidism 09/21/2016  . Unilateral primary osteoarthritis, right knee 08/27/2016  . Bunion of great toe of right foot 06/12/2016  . Acquired hammer toe of left foot   . Irregular bowel habits 10/25/2014  . Cerumen impaction 09/26/2014  . Essential hypertension 03/22/2014  . Wrist laceration 03/01/2014  . Primary open angle glaucoma of left eye, moderate stage 11/16/2013  . Primary open angle glaucoma of right eye, mild stage 11/16/2013  . Persistent atrial fibrillation ([place])   . Hyperlipidemia LDL goal <70   . Long term current use of anticoagulant therapy 09/24/2012  . CAD (coronary artery disease) 09/24/2012  . Transient cerebral ischemia 03/05/2011  . WEIGHT LOSS 08/23/2009  . CHANGE IN BOWELS 08/23/2009  . COLONIC POLYPS, ADENOMATOUS, HX OF 08/23/2009  . Anxiety state 07/02/2009  . Allergic rhinitis due to pollen 11/13/2008  . Major depressive disorder, single episode, moderate ([place]) 05/14/2008  . Sinus infection 07/13/2007  . Asthma-COPD overlap syndrome ([place]) 06/15/2007   PCP:  Julian Hy, PA-C Pharmacy:    [id number] [place], [address]. AT [address] [place] [place] [address] Phone: [phone] Fax: [phone]  [id number] [place], [address] [address] Phone: [phone] Fax: [phone]     Social Determinants of Health (SDOH) Interventions    Readmission Risk Interventions Readmission Risk Prevention Plan 10/26/2019  Transportation Screening Complete  PCP or Specialist Appt within 3-5 Days Complete  HRI or Home Care Consult Complete  Social Work Consult for [place] Planning/Counseling Complete  Palliative Care Screening Not Applicable  Medication Review Press photographer) Complete  Some recent data might be hidden

## 2019-10-26 NOTE — Anesthesia Procedure Notes (Signed)
Anesthesia Regional Block: Popliteal block   Pre-Anesthetic Checklist: ,, timeout performed, Correct Patient, Correct Site, Correct Laterality, Correct Procedure, Correct Position, site marked, Risks and benefits discussed,  Surgical consent,  Pre-op evaluation,  At surgeon's request and post-op pain management  Laterality: Right  Prep: chloraprep       Needles:  Injection technique: Single-shot  Needle Type: Echogenic Needle     Needle Length: 9cm  Needle Gauge: 21     Additional Needles:   Procedures:,,,, ultrasound used (permanent image in chart),,,,  Narrative:  Start time: 10/26/2019 11:20 AM End time: 10/26/2019 11:26 AM Injection made incrementally with aspirations every 5 mL.  Performed by: Personally  Anesthesiologist: Catalina Gravel, MD  Additional Notes: No pain on injection. No increased resistance to injection. Injection made in 5cc increments.  Good needle visualization.  Patient tolerated procedure well.

## 2019-10-26 NOTE — Progress Notes (Signed)
Patient going to OR for right BKA. Alert and oriented x 4; no acute distress noted at this time, no complaints. VS stable. Short stay was called for report.

## 2019-10-26 NOTE — Progress Notes (Addendum)
     [name] is a 86 y.o. female   Orthopaedic diagnosis: Right calcaneal osteomyelitis in the setting of the posterior heel ulceration and calcaneal fracture  Subjective: Patient is seen in the preoperative holding area.  She has continued drainage and foul odor from her calcaneal ulceration.  She denies significant amount of pain.  Objectyive: Vitals:   10/26/19 0400 10/26/19 0727  BP: 113/67 127/86  Pulse: 93 (!) 107  Resp: (!) 24 (!) 21  Temp: 99 F (37.2 C) 99 F (37.2 C)  SpO2: 94% 94%     Exam: Awake Respirations even and unlabored No acute distress  Right leg with posterior heel ulceration with foul odor and drainage.  There is erythema and redness about the heel area.  She has some swelling that extends up the leg.  No tenderness to palpation mid leg and proximal.  No obvious fluctuance about the heel.  Assessment: Right calcaneal osteomyelitis with calcaneus fracture in the setting of posterior heel ulceration   Plan: Patient has consented to undergo below the knee amputation for right calcaneal osteomyelitis and posterior calcaneal ulceration with foul odor.  Had a lengthy discussion with her family.  This is what they would like to proceed with.  We discussed nonsurgical options in the form of antibiotic suppression of the infection as well as partial calcanectomy and they opted to proceed with below the knee amputation as this is the most definitive treatment for her condition.  They understand the risks, alternatives and benefits of surgery which include but not limited to wound healing complications, continued infection, continued pain, need for further surgery, damage to surrounding structures, bleeding and extensive blood loss as well as the perioperative and anesthetic risk which includes death.  The patient is DNR.  They would like to proceed after weighing these risks.  The patient was evaluated by cardiology preoperatively and deemed a moderate to  high risk for surgery.  Her elevated INR was reversed by Dr. Sloan Leiter and is 2.1 this morning.  This is an acceptable level for surgery.   Radene Journey, MD

## 2019-10-26 NOTE — Anesthesia Procedure Notes (Signed)
Procedure Name: MAC Date/Time: 10/26/2019 11:52 AM Performed by: Inda Coke, CRNA Pre-anesthesia Checklist: Patient identified, Emergency Drugs available, Suction available, Patient being monitored and Timeout performed Patient Re-evaluated:Patient Re-evaluated prior to induction Oxygen Delivery Method: Simple face mask

## 2019-10-26 NOTE — Progress Notes (Signed)
PROGRESS NOTE        PATIENT DETAILS Name: [name] Age: 86 y.o. Sex: female Date of Birth: Jan 06, 1924 Admit Date: 10/21/2019 Admitting Physician Norval Morton, MD [id number], Lenna Sciara, PA-C  Brief Narrative: Patient is a 86 y.o. female with history of chronic atrial fibrillation-on Coumadin, chronic diastolic heart failure, CAD history of remote PCI, COPD/asthma overlap presented with shortness of breath-thought to be secondary to decompensated diastolic heart failure, A. fib with RVR-and also found to have osteomyelitis of the right calcaneus.  See below for further details.  Significant events: 4/17>> admit to [facility]-osteomyelitis of right calcaneus, A. fib with RVR, decompensated heart failure and coagulopathy 4/18>> TTE-EF [id number], RV systolic function mildly reduced-RVSP 55.2 4/22>> right BKA  Antimicrobial therapy: Flagyl 4/21>>4/22 Cefepime: 4/20>> Vancomycin 4/17>> 4/20 Rocephin 4/17>> 4/20, 4/22>>  Microbiology data: 4/7>> blood culture: Negative  Procedures : None  Consults: Orthopedics  DVT Prophylaxis : Supratherapeutic INR-Coumadin on hold  Subjective: Lying comfortably in bed.  No major issues overnight.  BKA scheduled for tomorrow.  Assessment/Plan: SIRS secondary to right lower extremity soft tissue infection with osteomyelitis and resultant pathological fracture of the right calcaneus: SIRS physiology has resolved-after extensive discussion patient/family-underwent right BKA on 4/22  Bacteroides bacteremia: 1 out of 2 blood cultures positive for Bacteroides ovatus-discussed with ID MD-switch to Rocephin and Flagyl.  Chronic atrial fibrillation with RVR: Heart rate controlled-she seems to be tolerating metoprolol well.  She did have transient hypotension on admission but has not reoccurred since then.  INR supratherapeutic on admission-Coumadin remains on hold.  Supratherapeutic INR: Secondary to Coumadin use-no  evidence of bleeding-given several doses of vitamin K-as patient was undergoing surgery on 4/21.  Continue to follow INR-resume Coumadin/anticoagulation when okay with orthopedics.  Acute hypoxic respiratory failure secondary to decompensated diastolic heart failure: Volume status improved-we will give 1 dose of IV Lasix today-we will plan on increasing Lasix to 40 mg p.o. daily if blood pressure allows tomorrow.  AKI: Improved-likely hemodynamically mediated  Transient hypotension: Likely secondary to medication use-monitor for now.  BP now stable.  Pulmonary hypertension: Likely secondary to COPD.  Doubt further work-up/invasive therapy will change management or outcome in this frail elderly patient.   COPD/asthma overlap: Some scattered wheezing today-stable and not in exacerbation-continue bronchodilators-encourage incentive spirometry  Anemia: Suspect has chronic anemia due to osteomyelitis/chronic inflammation-worsened by acute illness.  No indication for transfusion-follow for now.  Anxiety: Appears stable-continue as needed Ativan  Dysphagia: Chronic issue-appreciate SLP evaluation  4.5 cm ascending aortic aneurysm seen on CT chest: Stable for outpatient follow-up  Diet: Diet Order            DIET DYS 3 Room service appropriate? No; Fluid consistency: Nectar Thick  Diet effective now               Code Status:  DNR  Family Communication: Daughter over the phone on 4/22  Disposition Plan:  SNF when ready for discharge  Barriers to Discharge: Right leg soft tissue infection with osteomyelitis-s/p BKA with bacteremia requiring IV antibiotics along with decompensated heart failure.   Antimicrobial agents: Anti-infectives (From admission, onward)   Start     Dose/Rate Route Frequency Ordered Stop   10/25/19 1800  ceFEPIme (MAXIPIME) 2 g in sodium chloride 0.9 % 100 mL IVPB     2 g 200 mL/hr over 30 Minutes Intravenous Every  12 hours 10/25/19 0854     10/25/19 0845   metroNIDAZOLE (FLAGYL) tablet 500 mg     500 mg Oral Every 8 hours 10/25/19 0840     10/24/19 2200  vancomycin (VANCOCIN) IVPB 1000 mg/200 mL premix  Status:  Discontinued     1,000 mg 100 mL/hr over 120 Minutes Intravenous Every 36 hours 10/24/19 1013 10/25/19 0840   10/24/19 1645  ceFEPIme (MAXIPIME) 2 g in sodium chloride 0.9 % 100 mL IVPB  Status:  Discontinued     2 g 200 mL/hr over 30 Minutes Intravenous Every 8 hours 10/24/19 1644 10/25/19 0854   10/23/19 0800  vancomycin (VANCOCIN) IVPB 1000 mg/200 mL premix  Status:  Discontinued     1,000 mg 100 mL/hr over 120 Minutes Intravenous Every 48 hours 10/21/19 0652 10/24/19 1013   10/22/19 0800  cefTRIAXone (ROCEPHIN) 2 g in sodium chloride 0.9 % 100 mL IVPB  Status:  Discontinued     2 g 200 mL/hr over 30 Minutes Intravenous Every 24 hours 10/21/19 0647 10/24/19 1644   10/21/19 0700  vancomycin (VANCOREADY) IVPB 1250 mg/250 mL     1,250 mg 83.3 mL/hr over 180 Minutes Intravenous  Once 10/21/19 0630 10/21/19 1140   10/21/19 0700  cefTRIAXone (ROCEPHIN) 1 g in sodium chloride 0.9 % 100 mL IVPB     1 g 200 mL/hr over 30 Minutes Intravenous  Once 10/21/19 0647 10/21/19 1225   10/21/19 0515  cefTRIAXone (ROCEPHIN) 1 g in sodium chloride 0.9 % 100 mL IVPB     1 g 200 mL/hr over 30 Minutes Intravenous  Once 10/21/19 0501 10/21/19 0646       Time spent: 25 minutes-Greater than 50% of this time was spent in counseling, explanation of diagnosis, planning of further management, and coordination of care.  MEDICATIONS: Scheduled Meds: . bacitracin   Topical Daily  . Chlorhexidine Gluconate Cloth  6 each Topical Q0600  . citalopram  10 mg Oral Daily  . fluticasone furoate-vilanterol  1 puff Inhalation Daily  . [START ON 10/27/2019] furosemide  20 mg Oral Daily  . latanoprost  1 drop Both Eyes QHS  . levalbuterol  0.63 mg Nebulization TID  . metoprolol tartrate  25 mg Oral TID  . metroNIDAZOLE  500 mg Oral Q8H  . montelukast  10 mg Oral  QHS  . mupirocin ointment  1 application Nasal BID  . pilocarpine  1 drop Both Eyes BID  . sodium chloride flush  3 mL Intravenous Q12H  . Warfarin - Pharmacist Dosing Inpatient   Does not apply q1600   Continuous Infusions: . ceFEPime (MAXIPIME) IV 2 g (10/26/19 [id number])  . lactated ringers 10 mL/hr at 10/26/19 1111   PRN Meds:.acetaminophen **OR** acetaminophen, albuterol, ALPRAZolam, diphenhydrAMINE, fluticasone, ondansetron **OR** ondansetron (ZOFRAN) IV, Resource ThickenUp Clear   PHYSICAL EXAM: Vital signs: Vitals:   10/26/19 1135 10/26/19 1305 10/26/19 1320 10/26/19 1340  BP: 123/76 (!) 100/54 110/71 (!) 107/59  Pulse: (!) 112 100 93 87  Resp: 19 18 18 17   Temp:  (!) 97.3 F (36.3 C)  98.7 F (37.1 C)  TempSrc:    Oral  SpO2: 100% 92% 99% 96%  Weight:      Height:       Filed Weights   10/24/19 0350 10/25/19 0517 10/26/19 0415  Weight: 63.7 kg 63.1 kg 60.7 kg   Body mass index is 22.27 kg/m.   Gen Exam:Alert awake-not in any distress HEENT:atraumatic, normocephalic Chest: B/L clear to auscultation anteriorly  CVS:S1S2 regular Abdomen:soft non tender, non distended Extremities: Right BKA Neurology: Non focal Skin: no rash  I have personally reviewed following labs and imaging studies  LABORATORY DATA: CBC: Recent Labs  Lab 10/21/19 0319 10/21/19 0319 10/22/19 0526 10/23/19 0421 10/24/19 0357 10/25/19 0408 10/26/19 0621  WBC 8.4   < > 10.0 10.2 8.8 5.8 6.2  NEUTROABS 8.0*  --   --   --   --   --   --   HGB 8.3*   < > 7.7* 7.9* 8.4* 8.3* 9.2*  HCT 26.1*   < > 23.9*  22.7* 24.3* 25.9* 25.7* 27.8*  MCV 100.4*   < > 100.4* 100.0 99.6 100.0 97.5  PLT 121*   < > PLATELET CLUMPS NOTED ON SMEAR, UNABLE TO ESTIMATE PLATELET CLUMPS NOTED ON SMEAR, UNABLE TO ESTIMATE 128* 128* PLATELET CLUMPS NOTED ON SMEAR, COUNT APPEARS ADEQUATE   < > = values in this interval not displayed.    Basic Metabolic Panel: Recent Labs  Lab 10/22/19 0526 10/23/19 0421  10/24/19 0357 10/25/19 0408 10/26/19 0621  NA 137 138 139 142 142  K 3.7 3.7 3.6 3.4* 3.5  CL 103 102 102 106 97*  CO2 26 26 28 29  35*  GLUCOSE 69* 91 96 92 97  BUN 22 24* 21 15 15   CREATININE 1.02* 1.01* 0.89 0.75 0.96  CALCIUM 8.2* 8.5* 8.4* 8.2* 8.5*  MG 1.8 2.2  --   --   --     GFR: Estimated Creatinine Clearance: 32.9 mL/min (by C-G formula based on SCr of 0.96 mg/dL).  Liver Function Tests: Recent Labs  Lab 10/21/19 0319 10/23/19 0421  AST 24 24  ALT 16 17  ALKPHOS 62 68  BILITOT 1.0 0.6  PROT 6.7 5.9*  ALBUMIN 3.2* 2.3*   No results for input(s): LIPASE, AMYLASE in the last 168 hours. No results for input(s): AMMONIA in the last 168 hours.  Coagulation Profile: Recent Labs  Lab 10/23/19 0421 10/24/19 0357 10/25/19 0408 10/25/19 1803 10/26/19 0621  INR 6.6* 6.0* 5.3* 3.7* 2.1*    Cardiac Enzymes: No results for input(s): CKTOTAL, CKMB, CKMBINDEX, TROPONINI in the last 168 hours.  BNP (last 3 results) No results for input(s): PROBNP in the last 8760 hours.  Lipid Profile: No results for input(s): CHOL, HDL, LDLCALC, TRIG, CHOLHDL, LDLDIRECT in the last 72 hours.  Thyroid Function Tests: No results for input(s): TSH, T4TOTAL, FREET4, T3FREE, THYROIDAB in the last 72 hours.  Anemia Panel: No results for input(s): VITAMINB12, FOLATE, FERRITIN, TIBC, IRON, RETICCTPCT in the last 72 hours.  Urine analysis:    Component Value Date/Time   COLORURINE YELLOW 04/05/2018 1344   APPEARANCEUR CLEAR 04/05/2018 1344   LABSPEC 1.009 04/05/2018 1344   PHURINE 6.0 04/05/2018 1344   GLUCOSEU NEGATIVE 04/05/2018 1344   HGBUR NEGATIVE 04/05/2018 1344   BILIRUBINUR NEGATIVE 04/05/2018 1344   KETONESUR NEGATIVE 04/05/2018 1344   PROTEINUR NEGATIVE 04/05/2018 1344   UROBILINOGEN 0.2 03/05/2015 1600   NITRITE NEGATIVE 04/05/2018 1344   LEUKOCYTESUR NEGATIVE 04/05/2018 1344    Sepsis Labs: Lactic Acid, Venous    Component Value Date/Time   LATICACIDVEN 0.9  10/21/2019 2111    MICROBIOLOGY: Recent Results (from the past 240 hour(s))  Blood culture (routine x 2)     Status: Abnormal   Collection Time: 10/21/19  6:13 AM   Specimen: BLOOD  Result Value Ref Range Status   Specimen Description BLOOD RIGHT ANTECUBITAL  Final   Special Requests   Final  BOTTLES DRAWN AEROBIC AND ANAEROBIC Blood Culture results may not be optimal due to an excessive volume of blood received in culture bottles   Culture  Setup Time   Final    GRAM NEGATIVE RODS ANAEROBIC BOTTLE ONLY CRITICAL RESULT CALLED TO, READ BACK BY AND VERIFIED WITH: K. NAIK PHARMD, AT WE:[id number] 10/25/19 BY D. VANHOOK    Culture (A)  Final    BACTEROIDES OVATUS BETA LACTAMASE POSITIVE Performed at [facility], [place] [address] [place], [place] [address]    Report Status 10/26/2019 FINAL  Final  Blood culture (routine x 2)     Status: None   Collection Time: 10/21/19  6:13 AM   Specimen: BLOOD RIGHT WRIST  Result Value Ref Range Status   Specimen Description BLOOD RIGHT WRIST  Final   Special Requests   Final    BOTTLES DRAWN AEROBIC AND ANAEROBIC Blood Culture adequate volume   Culture   Final    NO GROWTH 5 DAYS Performed at [facility], [place] [address], [place], [place] [address]    Report Status 10/26/2019 FINAL  Final  Blood Culture ID Panel (Reflexed)     Status: None   Collection Time: 10/21/19  6:13 AM  Result Value Ref Range Status   Enterococcus species NOT DETECTED NOT DETECTED Final   Listeria monocytogenes NOT DETECTED NOT DETECTED Final   Staphylococcus species NOT DETECTED NOT DETECTED Final   Staphylococcus aureus (BCID) NOT DETECTED NOT DETECTED Final   Streptococcus species NOT DETECTED NOT DETECTED Final   Streptococcus agalactiae NOT DETECTED NOT DETECTED Final   Streptococcus pneumoniae NOT DETECTED NOT DETECTED Final   Streptococcus pyogenes NOT DETECTED NOT DETECTED Final   Acinetobacter baumannii NOT DETECTED NOT DETECTED Final    Enterobacteriaceae species NOT DETECTED NOT DETECTED Final   Enterobacter cloacae complex NOT DETECTED NOT DETECTED Final   Escherichia coli NOT DETECTED NOT DETECTED Final   Klebsiella oxytoca NOT DETECTED NOT DETECTED Final   Klebsiella pneumoniae NOT DETECTED NOT DETECTED Final   Proteus species NOT DETECTED NOT DETECTED Final   Serratia marcescens NOT DETECTED NOT DETECTED Final   Haemophilus influenzae NOT DETECTED NOT DETECTED Final   Neisseria meningitidis NOT DETECTED NOT DETECTED Final   Pseudomonas aeruginosa NOT DETECTED NOT DETECTED Final   Candida albicans NOT DETECTED NOT DETECTED Final   Candida glabrata NOT DETECTED NOT DETECTED Final   Candida krusei NOT DETECTED NOT DETECTED Final   Candida parapsilosis NOT DETECTED NOT DETECTED Final   Candida tropicalis NOT DETECTED NOT DETECTED Final    Comment: Performed at [facility], [address] [place], [place] [address]  SARS CORONAVIRUS 2 (TAT 6-24 HRS) Nasopharyngeal Nasopharyngeal Swab     Status: None   Collection Time: 10/21/19  7:30 AM   Specimen: Nasopharyngeal Swab  Result Value Ref Range Status   SARS Coronavirus 2 NEGATIVE NEGATIVE Final    Comment: (NOTE) SARS-CoV-2 target nucleic acids are NOT DETECTED. The SARS-CoV-2 RNA is generally detectable in upper and lower respiratory specimens during the acute phase of infection. Negative results do not preclude SARS-CoV-2 infection, do not rule out co-infections with other pathogens, and should not be used as the sole basis for treatment or other patient management decisions. Negative results must be combined with clinical observations, patient history, and epidemiological information. The expected result is Negative. Fact Sheet for Patients: SugarRoll.be Fact Sheet for Healthcare Providers: https://www.woods-mathews.com/ This test is not yet approved or cleared by the [place] FDA  and  has been authorized for  detection and/or diagnosis of SARS-CoV-2 by FDA under an Emergency Use Authorization (EUA). This EUA will remain  in effect (meaning this test can be used) for the duration of the COVID-19 declaration under Section 56 4(b)(1) of the Act, 21 U.S.C. section 360bbb-3(b)(1), unless the authorization is terminated or revoked sooner. Performed at [facility], [place] [address], [place], [place] [address]   Surgical PCR screen     Status: Abnormal   Collection Time: 10/26/19  7:19 AM   Specimen: Nasal Mucosa; Nasal Swab  Result Value Ref Range Status   MRSA, PCR NEGATIVE NEGATIVE Final   Staphylococcus aureus POSITIVE (A) NEGATIVE Final    Comment: (NOTE) The Xpert SA Assay (FDA approved for NASAL specimens in patients 69 years of age and older), is one component of a comprehensive surveillance program. It is not intended to diagnose infection nor to guide or monitor treatment. Performed at [facility], [place] [address], [place], [place] [address]     RADIOLOGY STUDIES/RESULTS: No results found.   LOS: 5 days   Oren Binet, MD  [facility]    To contact the attending provider between 7A-7P or the covering provider during after hours 7P-7A, please log into the web site [email or web address] and access using universal [place] password for that web site. If you do not have the password, please call the hospital operator.  10/26/2019, 2:24 PM

## 2019-10-26 NOTE — Anesthesia Preprocedure Evaluation (Addendum)
Anesthesia Evaluation  Patient identified by MRN, date of birth, ID band Patient awake    Reviewed: Allergy & Precautions, NPO status , Patient's Chart, lab work & pertinent test results  History of Anesthesia Complications (+) Family history of anesthesia reaction and history of anesthetic complications  Airway Mallampati: II  TM Distance: >3 FB Neck ROM: Full    Dental  (+) Dental Advisory Given, Partial Lower, Partial Upper   Pulmonary asthma , COPD, former smoker,    Pulmonary exam normal breath sounds clear to auscultation       Cardiovascular hypertension, + CAD  + dysrhythmias Atrial Fibrillation  Rhythm:Irregular Rate:Abnormal     Neuro/Psych PSYCHIATRIC DISORDERS Anxiety Depression TIA   GI/Hepatic Neg liver ROS, GERD  ,  Endo/Other  Hypothyroidism   Renal/GU Renal disease     Musculoskeletal  (+) Arthritis ,   Abdominal   Peds  Hematology  (+) Blood dyscrasia, anemia ,   Anesthesia Other Findings Day of surgery medications reviewed with the patient.  Reproductive/Obstetrics                            Anesthesia Physical Anesthesia Plan  ASA: IV  Anesthesia Plan: Regional   Post-op Pain Management:    Induction: Intravenous  PONV Risk Score and Plan: 2 and Propofol infusion and Treatment may vary due to age or medical condition  Airway Management Planned: Natural Airway and Nasal Cannula  Additional Equipment:   Intra-op Plan:   Post-operative Plan:   Informed Consent: I have reviewed the patients History and Physical, chart, labs and discussed the procedure including the risks, benefits and alternatives for the proposed anesthesia with the patient or authorized representative who has indicated his/her understanding and acceptance.   Patient has DNR.  Discussed DNR with patient and Suspend DNR.   Dental advisory given  Plan Discussed with: CRNA  Anesthesia  Plan Comments:         Anesthesia Quick Evaluation

## 2019-10-26 NOTE — Anesthesia Procedure Notes (Signed)
Anesthesia Regional Block: Adductor canal block   Pre-Anesthetic Checklist: ,, timeout performed, Correct Patient, Correct Site, Correct Laterality, Correct Procedure, Correct Position, site marked, Risks and benefits discussed,  Surgical consent,  Pre-op evaluation,  At surgeon's request and post-op pain management  Laterality: Right  Prep: chloraprep       Needles:  Injection technique: Single-shot  Needle Type: Echogenic Needle     Needle Length: 9cm  Needle Gauge: 21     Additional Needles:   Procedures:,,,, ultrasound used (permanent image in chart),,,,  Narrative:  Start time: 10/26/2019 11:26 AM End time: 10/26/2019 11:30 AM Injection made incrementally with aspirations every 5 mL.  Performed by: Personally  Anesthesiologist: Catalina Gravel, MD  Additional Notes: No pain on injection. No increased resistance to injection. Injection made in 5cc increments.  Good needle visualization.  Patient tolerated procedure well.

## 2019-10-26 NOTE — Progress Notes (Signed)
SLP Cancellation Note  Patient Details Name: [name] MRN: [id number] DOB: May 25, 1924   Cancelled treatment:       Reason Eval/Treat Not Completed: Patient at procedure or test/unavailable   Aleyda Gindlesperger, Katherene Ponto 10/26/2019, 11:49 AM

## 2019-10-26 NOTE — Progress Notes (Signed)
Patient back from OR. Alert and oriented x 3; no acute distress noted, no complaints. Dressing (compression wrap) on RLE - BKA. Clean, dry, intact. Patient has no sensation in RLE due to nerve block. VS stable. Will continue to monitor.

## 2019-10-26 NOTE — Transfer of Care (Signed)
Immediate Anesthesia Transfer of Care Note  Patient: [name]  Procedure(s) Performed: AMPUTATION BELOW KNEE (Right Knee)  Patient Location: PACU  Anesthesia Type:MAC and Regional  Level of Consciousness: awake, alert  and oriented  Airway & Oxygen Therapy: Patient Spontanous Breathing and Patient connected to nasal cannula oxygen  Post-op Assessment: Report given to RN and Post -op Vital signs reviewed and stable  Post vital signs: Reviewed and stable  Last Vitals:  Vitals Value Taken Time  BP 100/54 10/26/19 1306  Temp    Pulse 134 10/26/19 1307  Resp 18 10/26/19 1307  SpO2 95 % 10/26/19 1307  Vitals shown include unvalidated device data.  Last Pain:  Vitals:   10/26/19 0727  TempSrc: Oral  PainSc: 0-No pain         Complications: No apparent anesthesia complications

## 2019-10-26 NOTE — Anesthesia Postprocedure Evaluation (Signed)
Anesthesia Post Note  Patient: [name]  Procedure(s) Performed: AMPUTATION BELOW KNEE (Right Knee)     Patient location during evaluation: PACU Anesthesia Type: Regional Level of consciousness: awake and alert, awake and oriented Pain management: pain level controlled Vital Signs Assessment: post-procedure vital signs reviewed and stable Respiratory status: spontaneous breathing, nonlabored ventilation and respiratory function stable Cardiovascular status: stable and blood pressure returned to baseline Postop Assessment: no apparent nausea or vomiting Anesthetic complications: no    Last Vitals:  Vitals:   10/26/19 1320 10/26/19 1340  BP: 110/71 (!) 107/59  Pulse: 93 87  Resp: 18 17  Temp:  37.1 C  SpO2: 99% 96%    Last Pain:  Vitals:   10/26/19 1340  TempSrc: Oral  PainSc: 0-No pain                 Catalina Gravel

## 2019-10-26 NOTE — NC FL2 (Signed)
[place] LEVEL OF CARE SCREENING TOOL     IDENTIFICATION  Patient Name: [name] Birthdate: [birth year] Sex: female Admission Date (Current Location): 10/21/2019  [facility] and [place] Number:  Herbalist and Address:  [facility] [place], [place] [address], [place],  [address]      Provider Number: [id number]  Attending Physician Name and Address:  Jonetta Osgood, MD  Relative Name and Phone Number:  Earley Brooke, daughter, [phone]    Current Level of Care: Hospital Recommended Level of Care: [place] Prior Approval Number:    Date Approved/Denied:   PASRR Number: [id number]  Discharge Plan: SNF    Current Diagnoses: Patient Active Problem List   Diagnosis Date Noted  . Acute osteomyelitis of right foot ([place]) 10/21/2019  . Macrocytic anemia 10/21/2019  . AKI (acute kidney injury) ([place]) 10/21/2019  . SIRS (systemic inflammatory response syndrome) ([place]) 10/21/2019  . Transient hypotension 10/21/2019  . Dysphagia 10/21/2019  . AAA (abdominal aortic aneurysm) ([place]) 10/21/2019  . Pressure injury of skin 08/09/2019  . Cellulitis 08/08/2019  . Elevated IgE level 07/27/2019  . Healthcare maintenance 07/27/2019  . Palpitation 01/28/2019  . Atrial fibrillation with RVR ([place]) 01/28/2019  . Pain in left knee 11/24/2018  . Pain in right knee 11/24/2018  . Closed fracture of distal end of left radius 08/29/2018  . On continuous oral anticoagulation   . Hip fracture ([place]) 04/19/2017  . Closed left hip fracture, initial encounter ([place]) 04/18/2017  . Ulcer of toe of left foot, with necrosis of bone ([place]) 10/08/2016  . Non-pressure chronic ulcer of other part of left foot limited to breakdown of skin ([place]) 09/24/2016  . Acute asthma exacerbation 09/21/2016  . Allergic rhinitis 09/21/2016  . Anxiety 09/21/2016  . Arthritis 09/21/2016  . Breath shortness 09/21/2016  . Glaucoma 09/21/2016  . Adult  hypothyroidism 09/21/2016  . Unilateral primary osteoarthritis, right knee 08/27/2016  . Bunion of great toe of right foot 06/12/2016  . Acquired hammer toe of left foot   . Irregular bowel habits 10/25/2014  . Cerumen impaction 09/26/2014  . Essential hypertension 03/22/2014  . Wrist laceration 03/01/2014  . Primary open angle glaucoma of left eye, moderate stage 11/16/2013  . Primary open angle glaucoma of right eye, mild stage 11/16/2013  . Persistent atrial fibrillation ([place])   . Hyperlipidemia LDL goal <70   . Long term current use of anticoagulant therapy 09/24/2012  . CAD (coronary artery disease) 09/24/2012  . Transient cerebral ischemia 03/05/2011  . WEIGHT LOSS 08/23/2009  . CHANGE IN BOWELS 08/23/2009  . COLONIC POLYPS, ADENOMATOUS, HX OF 08/23/2009  . Anxiety state 07/02/2009  . Allergic rhinitis due to pollen 11/13/2008  . Major depressive disorder, single episode, moderate ([place]) 05/14/2008  . Sinus infection 07/13/2007  . Asthma-COPD overlap syndrome ([place]) 06/15/2007    Orientation RESPIRATION BLADDER Height & Weight     Situation, Place, Self  O2(Nasal Cannula 1L) External catheter, Incontinent Weight: 133 lb 13.1 oz (60.7 kg) Height:  5\' 5"  (165.1 cm)  BEHAVIORAL SYMPTOMS/MOOD NEUROLOGICAL BOWEL NUTRITION STATUS  Other (Comment)(no behavioral symptoms or moods)   Continent Diet(see dc summary)  AMBULATORY STATUS COMMUNICATION OF NEEDS Skin   Extensive Assist Verbally (Surgical Incision (see LDA))                       Personal Care Assistance Level of Assistance  Bathing, Feeding, Dressing Bathing Assistance: Maximum assistance Feeding assistance:  Independent Dressing Assistance: Maximum assistance     Functional Limitations Info  Sight, Hearing, Speech Sight Info: Impaired Hearing Info: Adequate Speech Info: Adequate    SPECIAL CARE FACTORS FREQUENCY  PT (By licensed PT), OT (By licensed OT)     PT Frequency: 5x OT Frequency: 5x             Contractures Contractures Info: Not present    Additional Factors Info  Code Status, Allergies Code Status Info: DNR Allergies Info: Alphagan, Apraclonidine, Biaxin, Brovana, Budesonide, Cefdinir, Cephalexin, Cifenline, Clonidine, Clonidine Derivatives, Dorzolamide,Iodinated Diagnostic Agents,Nsaids,Penicillins, Sulfamethoxazole,Sulfonamide Derivatives, Tetracycline, Timolol, Vancomycin, Zetia, and Cardizem.           Current Medications (10/26/2019):  This is the current hospital active medication list Current Facility-Administered Medications  Medication Dose Route Frequency Provider Last Rate Last Admin  . acetaminophen (TYLENOL) tablet 650 mg  650 mg Oral Q6H PRN Erle Crocker, MD   650 mg at 10/26/19 1520   Or  . acetaminophen (TYLENOL) suppository 650 mg  650 mg Rectal Q6H PRN Erle Crocker, MD      . albuterol (PROVENTIL) (2.5 MG/3ML) 0.083% nebulizer solution 2.5 mg  2.5 mg Nebulization Q6H PRN Erle Crocker, MD      . ALPRAZolam Duanne Moron) tablet 0.25 mg  0.25 mg Oral BID PRN Erle Crocker, MD   0.25 mg at 10/24/19 2123  . bacitracin ointment   Topical Daily Erle Crocker, MD   Given at 10/26/19 [phone]  . cefTRIAXone (ROCEPHIN) 2 g in sodium chloride 0.9 % 100 mL IVPB  2 g Intravenous Q24H Ghimire, Henreitta Leber, MD      . Chlorhexidine Gluconate Cloth 2 % PADS 6 each  6 each Topical Q0600 Erle Crocker, MD   6 each at 10/26/19 [phone]  . citalopram (CELEXA) tablet 10 mg  10 mg Oral Daily Erle Crocker, MD   10 mg at 10/26/19 0827  . diphenhydrAMINE (BENADRYL) capsule 25 mg  25 mg Oral Daily PRN Erle Crocker, MD   25 mg at 10/24/19 2123  . fluticasone (FLONASE) 50 MCG/ACT nasal spray 1 spray  1 spray Each Nare Daily PRN Erle Crocker, MD   1 spray at 10/25/19 [phone]  . fluticasone furoate-vilanterol (BREO ELLIPTA) 200-25 MCG/INH 1 puff  1 puff Inhalation Daily Erle Crocker, MD   1 puff at 10/26/19 0818  . [START ON  10/27/2019] furosemide (LASIX) tablet 20 mg  20 mg Oral Daily Erle Crocker, MD      . lactated ringers infusion   Intravenous Continuous Erle Crocker, MD 10 mL/hr at 10/26/19 1111 Restarted at 10/26/19 1151  . latanoprost (XALATAN) 0.005 % ophthalmic solution 1 drop  1 drop Both Eyes QHS Erle Crocker, MD   1 drop at 10/25/19 2210  . levalbuterol (XOPENEX) nebulizer solution 0.63 mg  0.63 mg Nebulization TID Erle Crocker, MD   0.63 mg at 10/26/19 1412  . metoprolol tartrate (LOPRESSOR) tablet 25 mg  25 mg Oral TID Erle Crocker, MD   25 mg at 10/26/19 1520  . metroNIDAZOLE (FLAGYL) tablet 500 mg  500 mg Oral Q8H Erle Crocker, MD   500 mg at 10/26/19 1409  . montelukast (SINGULAIR) tablet 10 mg  10 mg Oral QHS Erle Crocker, MD   10 mg at 10/25/19 2206  . mupirocin ointment (BACTROBAN) 2 % 1 application  1 application Nasal BID Erle Crocker, MD   1 application at  10/26/19 0946  . ondansetron (ZOFRAN) tablet 4 mg  4 mg Oral Q6H PRN Erle Crocker, MD       Or  . ondansetron [facility]) injection 4 mg  4 mg Intravenous Q6H PRN Erle Crocker, MD      . pilocarpine (PILOCAR) 1 % ophthalmic solution 1 drop  1 drop Both Eyes BID Erle Crocker, MD   1 drop at 10/26/19 0830  . Resource ThickenUp Clear   Oral PRN Erle Crocker, MD      . sodium chloride flush (NS) 0.9 % injection 3 mL  3 mL Intravenous Q12H Erle Crocker, MD   3 mL at 10/26/19 0834  . Warfarin - Pharmacist Dosing Inpatient   Does not apply q1600 Erle Crocker, MD   Stopped at 10/22/19 1600     Discharge Medications: Please see discharge summary for a list of discharge medications.  Relevant Imaging Results:  Relevant Lab Results:   Additional Information SSN: [id number]  Ralph Dowdy, Student-Social Work

## 2019-10-26 NOTE — Progress Notes (Signed)
Success with the Purewick overnight. No acute events.  VSS.  Decreased pt oxygen from 3L [place] to 1L [place]. Pt slept for majority of the night. Pt has been NPO since midnight for BKA today.

## 2019-10-26 NOTE — Brief Op Note (Signed)
10/26/2019  1:04 PM  PATIENT:  [name]  86 y.o. female  PRE-OPERATIVE DIAGNOSIS:  osteomylitis right foot  POST-OPERATIVE DIAGNOSIS:  osteomylitis right foot  PROCEDURE:  Procedure(s): AMPUTATION BELOW KNEE (Right)  SURGEON:  Surgeon(s) and Role:    * Erle Crocker, MD - Primary  PHYSICIAN ASSISTANT:   ASSISTANTS: Justin RNFA   ANESTHESIA:   MAC, peripheral nerve blockade  EBL:  100 mL   BLOOD ADMINISTERED:none  DRAINS: none   LOCAL MEDICATIONS USED:  NONE  SPECIMEN:  Source of Specimen:  Right leg  DISPOSITION OF SPECIMEN:  PATHOLOGY  COUNTS:  YES  TOURNIQUET:   Total Tourniquet Time Documented: Thigh (Right) - 21 minutes Total: Thigh (Right) - 21 minutes   DICTATION: .Viviann Spare Dictation  PLAN OF CARE: Admit to inpatient   PATIENT DISPOSITION:  PACU - hemodynamically stable.   Delay start of Pharmacological VTE agent (>24hrs) due to surgical blood loss or risk of bleeding: yes

## 2019-10-27 DIAGNOSIS — I4891 Unspecified atrial fibrillation: Secondary | ICD-10-CM | POA: Diagnosis not present

## 2019-10-27 DIAGNOSIS — R791 Abnormal coagulation profile: Secondary | ICD-10-CM | POA: Diagnosis not present

## 2019-10-27 LAB — BASIC METABOLIC PANEL
Anion gap: 8 (ref 5–15)
BUN: 20 mg/dL (ref 8–23)
CO2: 37 mmol/L — ABNORMAL HIGH (ref 22–32)
Calcium: 8.2 mg/dL — ABNORMAL LOW (ref 8.9–10.3)
Chloride: 95 mmol/L — ABNORMAL LOW (ref 98–111)
Creatinine, Ser: 1.06 mg/dL — ABNORMAL HIGH (ref 0.44–1.00)
GFR calc Af Amer: 52 mL/min — ABNORMAL LOW (ref >60)
GFR calc non Af Amer: 45 mL/min — ABNORMAL LOW (ref >60)
Glucose, Bld: 96 mg/dL (ref 70–99)
Potassium: 3.2 mmol/L — ABNORMAL LOW (ref 3.5–5.1)
Sodium: 140 mmol/L (ref 135–145)

## 2019-10-27 LAB — CBC
HCT: 27.7 % — ABNORMAL LOW (ref 36.0–46.0)
Hemoglobin: 8.8 g/dL — ABNORMAL LOW (ref 12.0–15.0)
MCH: 31.3 pg (ref 26.0–34.0)
MCHC: 31.8 g/dL (ref 30.0–36.0)
MCV: 98.6 fL (ref 80.0–100.0)
Platelets: 146 10*3/uL — ABNORMAL LOW (ref 150–400)
RBC: 2.81 MIL/uL — ABNORMAL LOW (ref 3.87–5.11)
RDW: 18.2 % — ABNORMAL HIGH (ref 11.5–15.5)
WBC: 7.9 10*3/uL (ref 4.0–10.5)
nRBC: 0 % (ref 0.0–0.2)

## 2019-10-27 LAB — PROTIME-INR
INR: 1.7 — ABNORMAL HIGH (ref 0.8–1.2)
Prothrombin Time: 19.5 seconds — ABNORMAL HIGH (ref 11.4–15.2)

## 2019-10-27 MED ORDER — WARFARIN SODIUM 3 MG PO TABS
3.0000 mg | ORAL_TABLET | Freq: Once | ORAL | Status: AC
  Administered 2019-10-27: 3 mg via ORAL
  Filled 2019-10-27: qty 1

## 2019-10-27 MED ORDER — SENNOSIDES-DOCUSATE SODIUM 8.6-50 MG PO TABS
2.0000 | ORAL_TABLET | Freq: Two times a day (BID) | ORAL | Status: AC
  Administered 2019-10-27 – 2019-10-29 (×6): 2 via ORAL
  Filled 2019-10-27 (×6): qty 2

## 2019-10-27 MED ORDER — METOPROLOL TARTRATE 25 MG PO TABS
25.0000 mg | ORAL_TABLET | Freq: Four times a day (QID) | ORAL | Status: DC
  Administered 2019-10-27 – 2019-10-31 (×17): 25 mg via ORAL
  Filled 2019-10-27 (×17): qty 1

## 2019-10-27 MED ORDER — POTASSIUM CHLORIDE CRYS ER 20 MEQ PO TBCR
40.0000 meq | EXTENDED_RELEASE_TABLET | ORAL | Status: AC
  Administered 2019-10-27 (×2): 40 meq via ORAL
  Filled 2019-10-27 (×2): qty 2

## 2019-10-27 NOTE — Progress Notes (Signed)
PROGRESS NOTE        PATIENT DETAILS Name: NURY GROSKOPF Age: 86 y.o. Sex: female Date of Birth: [birth year] Admit Date: 10/21/2019 Admitting Physician Norval Morton, MD [id number], Lenna Sciara, PA-C  Brief Narrative:  Patient is a 86 y.o. female with history of chronic atrial fibrillation-on Coumadin, chronic diastolic heart failure, CAD history of remote PCI, COPD/asthma overlap presented with shortness of breath-thought to be secondary to decompensated diastolic heart failure, A. fib with RVR-and also found to have osteomyelitis of the right calcaneus.  See below for further details.  Significant events: 4/17>> admit to [facility]-osteomyelitis of right calcaneus, A. fib with RVR, decompensated heart failure and coagulopathy 4/18>> TTE-EF [id number], RV systolic function mildly reduced-RVSP 55.2 4/22>> right BKA  Antimicrobial therapy: Flagyl 4/21>>4/22 Cefepime: 4/20>> Vancomycin 4/17>> 4/20 Rocephin 4/17>> 4/20, 4/22>>  Microbiology data: 4/7>> blood culture: Negative  Procedures : 4/22>> right BKA  Consults: Orthopedics  DVT Prophylaxis : On Warfarin  Subjective: Lying comfortably in bed.  No major issues overnight.  Does report some nausea, reports some constipation as well.  Assessment/Plan:  SIRS secondary to right lower extremity soft tissue infection with osteomyelitis and resultant pathological fracture of the right calcaneus:  - SIRS physiology has resolved-after extensive discussion patient/family-underwent right BKA on 4/22  Bacteroides bacteremia:  - 1 out of 2 blood cultures positive for Bacteroides ovatus-discussed with ID MD-switch to Rocephin and Flagyl.  Keep on this regimen during hospital stay, and upon discharge she will be discharged on oral Flagyl for total of 14 days of Flagyl.  Chronic atrial fibrillation with RVR: -Heart rate remains uncontrolled , she, her metoprolol has been increased to 25 mg every 6 hours,  given soft blood pressure . -Discussed with Ortho, okay to resume back on warfarin .  Supratherapeutic INR:  -Secondary to Coumadin use-no evidence of bleeding-given several doses of vitamin K-as patient was undergoing surgery on 4/21.  Resume on warfarin today.  Acute hypoxic respiratory failure secondary to decompensated diastolic heart failure: Volume status improved-we will give 1 dose of IV Lasix today-we will plan on increasing Lasix to 40 mg p.o. daily if blood pressure allows tomorrow.  AKI: Improved-likely hemodynamically mediated  Transient hypotension: Likely secondary to medication use-monitor for now.  BP now stable.  Pulmonary hypertension: Likely secondary to COPD.  Doubt further work-up/invasive therapy will change management or outcome in this frail elderly patient.   COPD/asthma overlap: Some scattered wheezing today-stable and not in exacerbation-continue bronchodilators-encourage incentive spirometry  Anemia: Suspect has chronic anemia due to osteomyelitis/chronic inflammation-worsened by acute illness.  No indication for transfusion-follow for now.  Anxiety: Appears stable-continue as needed Ativan  Dysphagia: Chronic issue-appreciate SLP evaluation  Hypokalemia: Potassium of 3.2, repleted  4.5 cm ascending aortic aneurysm seen on CT chest: Stable for outpatient follow-up  Diet: Diet Order            DIET DYS 3 Room service appropriate? No; Fluid consistency: Nectar Thick  Diet effective now               Code Status:  DNR  Family Communication: Daughter over the phone on 4/23  Disposition Plan:  SNF when ready for discharge  Barriers to Discharge: Right leg soft tissue infection with osteomyelitis-s/p BKA with bacteremia requiring IV antibiotics along with uncontrolled heart rate.   Antimicrobial agents: Anti-infectives (From admission, onward)   Start  Dose/Rate Route Frequency Ordered Stop   10/26/19 1800  cefTRIAXone (ROCEPHIN) 2 g in  sodium chloride 0.9 % 100 mL IVPB     2 g 200 mL/hr over 30 Minutes Intravenous Every 24 hours 10/26/19 1436     10/25/19 1800  ceFEPIme (MAXIPIME) 2 g in sodium chloride 0.9 % 100 mL IVPB  Status:  Discontinued     2 g 200 mL/hr over 30 Minutes Intravenous Every 12 hours 10/25/19 0854 10/26/19 1436   10/25/19 0845  metroNIDAZOLE (FLAGYL) tablet 500 mg     500 mg Oral Every 8 hours 10/25/19 0840     10/24/19 2200  vancomycin (VANCOCIN) IVPB 1000 mg/200 mL premix  Status:  Discontinued     1,000 mg 100 mL/hr over 120 Minutes Intravenous Every 36 hours 10/24/19 1013 10/25/19 0840   10/24/19 1645  ceFEPIme (MAXIPIME) 2 g in sodium chloride 0.9 % 100 mL IVPB  Status:  Discontinued     2 g 200 mL/hr over 30 Minutes Intravenous Every 8 hours 10/24/19 1644 10/25/19 0854   10/23/19 0800  vancomycin (VANCOCIN) IVPB 1000 mg/200 mL premix  Status:  Discontinued     1,000 mg 100 mL/hr over 120 Minutes Intravenous Every 48 hours 10/21/19 0652 10/24/19 1013   10/22/19 0800  cefTRIAXone (ROCEPHIN) 2 g in sodium chloride 0.9 % 100 mL IVPB  Status:  Discontinued     2 g 200 mL/hr over 30 Minutes Intravenous Every 24 hours 10/21/19 0647 10/24/19 1644   10/21/19 0700  vancomycin (VANCOREADY) IVPB 1250 mg/250 mL     1,250 mg 83.3 mL/hr over 180 Minutes Intravenous  Once 10/21/19 0630 10/21/19 1140   10/21/19 0700  cefTRIAXone (ROCEPHIN) 1 g in sodium chloride 0.9 % 100 mL IVPB     1 g 200 mL/hr over 30 Minutes Intravenous  Once 10/21/19 0647 10/21/19 1225   10/21/19 0515  cefTRIAXone (ROCEPHIN) 1 g in sodium chloride 0.9 % 100 mL IVPB     1 g 200 mL/hr over 30 Minutes Intravenous  Once 10/21/19 0501 10/21/19 0646       MEDICATIONS: Scheduled Meds: . bacitracin   Topical Daily  . Chlorhexidine Gluconate Cloth  6 each Topical Q0600  . citalopram  10 mg Oral Daily  . fluticasone furoate-vilanterol  1 puff Inhalation Daily  . furosemide  20 mg Oral Daily  . latanoprost  1 drop Both Eyes QHS  .  metoprolol tartrate  25 mg Oral QID  . metroNIDAZOLE  500 mg Oral Q8H  . montelukast  10 mg Oral QHS  . mupirocin ointment  1 application Nasal BID  . pilocarpine  1 drop Both Eyes BID  . sodium chloride flush  3 mL Intravenous Q12H  . Warfarin - Pharmacist Dosing Inpatient   Does not apply q1600   Continuous Infusions: . cefTRIAXone (ROCEPHIN)  IV 2 g (10/26/19 1723)  . lactated ringers 10 mL/hr at 10/26/19 1111   PRN Meds:.acetaminophen **OR** acetaminophen, albuterol, ALPRAZolam, diphenhydrAMINE, fluticasone, ondansetron **OR** ondansetron (ZOFRAN) IV, Resource ThickenUp Clear   PHYSICAL EXAM: Vital signs: Vitals:   10/27/19 0451 10/27/19 0752 10/27/19 1020 10/27/19 1212  BP:  122/81  94/61  Pulse:  (!) 115 (!) 107 100  Resp:  15 16 19   Temp:  97.9 F (36.6 C)  98.1 F (36.7 C)  TempSrc:  Axillary  Oral  SpO2:  96% 95% 96%  Weight: 56.7 kg     Height:       [employer]  10/26/19 0415 10/27/19 0400 10/27/19 0451  Weight: 60.7 kg 53.8 kg 56.7 kg   Body mass index is 20.8 kg/m.   Awake Alert , No new F.N deficits, Normal affect Symmetrical Chest wall movement, Good air movement bilaterally, CTAB Irregular regular, tachycardic no Gallops,Rubs or new Murmurs, No Parasternal Heave +ve B.Sounds, Abd Soft, No tenderness, No rebound - guarding or rigidity. right BKA   I have personally reviewed following labs and imaging studies  LABORATORY DATA: CBC: Recent Labs  Lab 10/21/19 0319 10/22/19 0526 10/23/19 0421 10/24/19 0357 10/25/19 0408 10/26/19 0621 10/27/19 0619  WBC 8.4   < > 10.2 8.8 5.8 6.2 7.9  NEUTROABS 8.0*  --   --   --   --   --   --   HGB 8.3*   < > 7.9* 8.4* 8.3* 9.2* 8.8*  HCT 26.1*   < > 24.3* 25.9* 25.7* 27.8* 27.7*  MCV 100.4*   < > 100.0 99.6 100.0 97.5 98.6  PLT 121*   < > PLATELET CLUMPS NOTED ON SMEAR, UNABLE TO ESTIMATE 128* 128* PLATELET CLUMPS NOTED ON SMEAR, COUNT APPEARS ADEQUATE 146*   < > = values in this interval not displayed.      Basic Metabolic Panel: Recent Labs  Lab 10/22/19 0526 10/22/19 0526 10/23/19 0421 10/24/19 0357 10/25/19 0408 10/26/19 0621 10/27/19 0619  NA 137   < > 138 139 142 142 140  K 3.7   < > 3.7 3.6 3.4* 3.5 3.2*  CL 103   < > 102 102 106 97* 95*  CO2 26   < > 26 28 29  35* 37*  GLUCOSE 69*   < > 91 96 92 97 96  BUN 22   < > 24* 21 15 15 20   CREATININE 1.02*   < > 1.01* 0.89 0.75 0.96 1.06*  CALCIUM 8.2*   < > 8.5* 8.4* 8.2* 8.5* 8.2*  MG 1.8  --  2.2  --   --   --   --    < > = values in this interval not displayed.    GFR: Estimated Creatinine Clearance: 29.7 mL/min (A) (by C-G formula based on SCr of 1.06 mg/dL (H)).  Liver Function Tests: Recent Labs  Lab 10/21/19 0319 10/23/19 0421  AST 24 24  ALT 16 17  ALKPHOS 62 68  BILITOT 1.0 0.6  PROT 6.7 5.9*  ALBUMIN 3.2* 2.3*   No results for input(s): LIPASE, AMYLASE in the last 168 hours. No results for input(s): AMMONIA in the last 168 hours.  Coagulation Profile: Recent Labs  Lab 10/24/19 0357 10/25/19 0408 10/25/19 1803 10/26/19 0621 10/27/19 0619  INR 6.0* 5.3* 3.7* 2.1* 1.7*    Cardiac Enzymes: No results for input(s): CKTOTAL, CKMB, CKMBINDEX, TROPONINI in the last 168 hours.  BNP (last 3 results) No results for input(s): PROBNP in the last 8760 hours.  Lipid Profile: No results for input(s): CHOL, HDL, LDLCALC, TRIG, CHOLHDL, LDLDIRECT in the last 72 hours.  Thyroid Function Tests: No results for input(s): TSH, T4TOTAL, FREET4, T3FREE, THYROIDAB in the last 72 hours.  Anemia Panel: No results for input(s): VITAMINB12, FOLATE, FERRITIN, TIBC, IRON, RETICCTPCT in the last 72 hours.  Urine analysis:    Component Value Date/Time   COLORURINE YELLOW 04/05/2018 [place] 04/05/2018 1344   LABSPEC 1.009 04/05/2018 1344   PHURINE 6.0 04/05/2018 1344   GLUCOSEU NEGATIVE 04/05/2018 1344   HGBUR NEGATIVE 04/05/2018 1344   BILIRUBINUR NEGATIVE 04/05/2018 1344  KETONESUR NEGATIVE  04/05/2018 1344   PROTEINUR NEGATIVE 04/05/2018 1344   UROBILINOGEN 0.2 03/05/2015 1600   NITRITE NEGATIVE 04/05/2018 1344   LEUKOCYTESUR NEGATIVE 04/05/2018 1344    Sepsis Labs: Lactic Acid, Venous    Component Value Date/Time   LATICACIDVEN 0.9 10/21/2019 2111    MICROBIOLOGY: Recent Results (from the past 240 hour(s))  Blood culture (routine x 2)     Status: Abnormal   Collection Time: 10/21/19  6:13 AM   Specimen: BLOOD  Result Value Ref Range Status   Specimen Description BLOOD RIGHT ANTECUBITAL  Final   Special Requests   Final    BOTTLES DRAWN AEROBIC AND ANAEROBIC Blood Culture results may not be optimal due to an excessive volume of blood received in culture bottles   Culture  Setup Time   Final    GRAM NEGATIVE RODS ANAEROBIC BOTTLE ONLY CRITICAL RESULT CALLED TO, READ BACK BY AND VERIFIED WITH: K. NAIK PHARMD, AT [id number] 10/25/19 BY D. VANHOOK    Culture (A)  Final    BACTEROIDES OVATUS BETA LACTAMASE POSITIVE Performed at [facility], [place] [address] [place], [place] [address]    Report Status 10/26/2019 FINAL  Final  Blood culture (routine x 2)     Status: None   Collection Time: 10/21/19  6:13 AM   Specimen: BLOOD RIGHT WRIST  Result Value Ref Range Status   Specimen Description BLOOD RIGHT WRIST  Final   Special Requests   Final    BOTTLES DRAWN AEROBIC AND ANAEROBIC Blood Culture adequate volume   Culture   Final    NO GROWTH 5 DAYS Performed at [facility], [place] [address] [place], [place] [address]    Report Status 10/26/2019 FINAL  Final  Blood Culture ID Panel (Reflexed)     Status: None   Collection Time: 10/21/19  6:13 AM  Result Value Ref Range Status   Enterococcus species NOT DETECTED NOT DETECTED Final   Listeria monocytogenes NOT DETECTED NOT DETECTED Final   Staphylococcus species NOT DETECTED NOT DETECTED Final   Staphylococcus aureus (BCID) NOT DETECTED NOT DETECTED Final   Streptococcus species NOT DETECTED NOT DETECTED  Final   Streptococcus agalactiae NOT DETECTED NOT DETECTED Final   Streptococcus pneumoniae NOT DETECTED NOT DETECTED Final   Streptococcus pyogenes NOT DETECTED NOT DETECTED Final   Acinetobacter baumannii NOT DETECTED NOT DETECTED Final   Enterobacteriaceae species NOT DETECTED NOT DETECTED Final   Enterobacter cloacae complex NOT DETECTED NOT DETECTED Final   Escherichia coli NOT DETECTED NOT DETECTED Final   Klebsiella oxytoca NOT DETECTED NOT DETECTED Final   Klebsiella pneumoniae NOT DETECTED NOT DETECTED Final   Proteus species NOT DETECTED NOT DETECTED Final   Serratia marcescens NOT DETECTED NOT DETECTED Final   Haemophilus influenzae NOT DETECTED NOT DETECTED Final   Neisseria meningitidis NOT DETECTED NOT DETECTED Final   Pseudomonas aeruginosa NOT DETECTED NOT DETECTED Final   Candida albicans NOT DETECTED NOT DETECTED Final   Candida glabrata NOT DETECTED NOT DETECTED Final   Candida krusei NOT DETECTED NOT DETECTED Final   Candida parapsilosis NOT DETECTED NOT DETECTED Final   Candida tropicalis NOT DETECTED NOT DETECTED Final    Comment: Performed at [facility], [address], [place], [place] [address]  SARS CORONAVIRUS 2 (TAT 6-24 HRS) Nasopharyngeal Nasopharyngeal Swab     Status: None   Collection Time: 10/21/19  7:30 AM   Specimen: Nasopharyngeal Swab  Result Value Ref Range Status  SARS Coronavirus 2 NEGATIVE NEGATIVE Final    Comment: (NOTE) SARS-CoV-2 target nucleic acids are NOT DETECTED. The SARS-CoV-2 RNA is generally detectable in upper and lower respiratory specimens during the acute phase of infection. Negative results do not preclude SARS-CoV-2 infection, do not rule out co-infections with other pathogens, and should not be used as the sole basis for treatment or other patient management decisions. Negative results must be combined with clinical observations, patient history, and epidemiological information. The expected result is  Negative. Fact Sheet for Patients: SugarRoll.be Fact Sheet for Healthcare Providers: https://www.woods-mathews.com/ This test is not yet approved or cleared by the [place] FDA and  has been authorized for detection and/or diagnosis of SARS-CoV-2 by FDA under an Emergency Use Authorization (EUA). This EUA will remain  in effect (meaning this test can be used) for the duration of the COVID-19 declaration under Section 56 4(b)(1) of the Act, 21 U.S.C. section 360bbb-3(b)(1), unless the authorization is terminated or revoked sooner. Performed at [facility], [place] [address], [place], [place] [address]   Surgical PCR screen     Status: Abnormal   Collection Time: 10/26/19  7:19 AM   Specimen: Nasal Mucosa; Nasal Swab  Result Value Ref Range Status   MRSA, PCR NEGATIVE NEGATIVE Final   Staphylococcus aureus POSITIVE (A) NEGATIVE Final    Comment: (NOTE) The Xpert SA Assay (FDA approved for NASAL specimens in patients 63 years of age and older), is one component of a comprehensive surveillance program. It is not intended to diagnose infection nor to guide or monitor treatment. Performed at [facility], [place] [address], [place], [place] [address]     RADIOLOGY STUDIES/RESULTS: No results found.   LOS: 6 days   Phillips Climes, MD  [facility]    To contact the attending provider between 7A-7P or the covering provider during after hours 7P-7A, please log into the web site [email or web address] and access using universal [place] password for that web site. If you do not have the password, please call the hospital operator.  10/27/2019, 1:59 PM

## 2019-10-27 NOTE — TOC Progression Note (Addendum)
Transition of Care [facility]) - Progression Note    Patient Details  Name: CAMIE KNABLE MRN: [id number] Date of Birth: [birth year]  Transition of Care [facility]) CM/SW [place], LCSW Phone Number: 10/27/2019, 10:09 AM  Clinical Narrative:    10:09am-Left voicemail for patient's granddaughter, Colletta Maryland, to discuss SNF bed offers.  10:30am-CSW spoke with Colletta Maryland and presented SNF bed offers. She will contact CSW back with choice.    Expected Discharge Plan: [place] Barriers to Discharge: Continued Medical Work up  Expected Discharge Plan and Services Expected Discharge Plan: [place] In-house Referral: Clinical Social Work   Post Acute Care Choice: [place] Living arrangements for the past 2 months: Single Family Home                                       Social Determinants of Health (SDOH) Interventions    Readmission Risk Interventions Readmission Risk Prevention Plan 10/26/2019  Transportation Screening Complete  PCP or Specialist Appt within 3-5 Days Complete  HRI or [place] Complete  Social Work Consult for [place] Planning/Counseling Complete  Palliative Care Screening Not Applicable  Medication Review Press photographer) Complete  Some recent data might be hidden

## 2019-10-27 NOTE — Progress Notes (Signed)
PT Cancellation Note  Patient Details Name: [name] MRN: [id number] DOB: 11-Oct-1923   Cancelled Treatment:    Reason Eval/Treat Not Completed: Patient declined, no reason specified;Other (comment)(not feeling well) New physical therapy orders received on 10/27/19 s/p R BKA 10/26/19. Nurse cleared patient to participate in PT re-evaluation. Despite education and encouragement, patient declined participating at this time noting she did not feel well. She reports that pain is managed with current medication. PT to attempt at a later time/date, schedule permitting.  Birdie Hopes 10/27/2019, 1:42 PM

## 2019-10-27 NOTE — Progress Notes (Signed)
Progress Note  Patient Name: [name] Date of Encounter: 10/27/2019  Primary Cardiologist: Fransico Him, MD   Subjective   Breathing is OK  NO CP    Inpatient Medications    Scheduled Meds: . bacitracin   Topical Daily  . Chlorhexidine Gluconate Cloth  6 each Topical Q0600  . citalopram  10 mg Oral Daily  . fluticasone furoate-vilanterol  1 puff Inhalation Daily  . furosemide  20 mg Oral Daily  . latanoprost  1 drop Both Eyes QHS  . metoprolol tartrate  25 mg Oral TID  . metroNIDAZOLE  500 mg Oral Q8H  . montelukast  10 mg Oral QHS  . mupirocin ointment  1 application Nasal BID  . pilocarpine  1 drop Both Eyes BID  . sodium chloride flush  3 mL Intravenous Q12H  . Warfarin - Pharmacist Dosing Inpatient   Does not apply q1600   Continuous Infusions: . cefTRIAXone (ROCEPHIN)  IV 2 g (10/26/19 1723)  . lactated ringers 10 mL/hr at 10/26/19 1111   PRN Meds: acetaminophen **OR** acetaminophen, albuterol, ALPRAZolam, diphenhydrAMINE, fluticasone, ondansetron **OR** ondansetron (ZOFRAN) IV, Resource ThickenUp Clear   Vital Signs    Vitals:   10/26/19 2000 10/26/19 2147 10/27/19 0400 10/27/19 0451  BP:   106/73   Pulse:   90   Resp:   18   Temp: 97.8 F (36.6 C)  98.1 F (36.7 C)   TempSrc: Oral  Axillary   SpO2:  97% 97%   Weight:   53.8 kg 56.7 kg  Height:        Intake/Output Summary (Last 24 hours) at 10/27/2019 0639 Last data filed at 10/26/2019 2100 Gross per 24 hour  Intake 740 ml  Output 900 ml  Net -160 ml   Net neg 1.8 L  Last 3 Weights 10/27/2019 10/27/2019 10/26/2019  Weight (lbs) 125 lb 118 lb 9.7 oz 133 lb 13.1 oz  Weight (kg) 56.7 kg 53.8 kg 60.7 kg      Telemetry     Afib   90s to 100s  - Personally Reviewed  ECG    No new - Personally Reviewed  Physical Exam   GEN: No acute distress.   Neck: No JVD Cardiac: Irreg irreg  No signif murmurs Respiratory: Mild rhonchi. GI: Soft, nontender, non-distended  MS: s/p R BKA   L  foot in boot   Neuro:  Nonfocal  Psych: Normal affect   Labs    High Sensitivity Troponin:   Recent Labs  Lab 10/21/19 0319 10/21/19 0613  TROPONINIHS 4 6      Chemistry Recent Labs  Lab 10/21/19 0319 10/22/19 0526 10/23/19 0421 10/23/19 0421 10/24/19 0357 10/25/19 0408 10/26/19 0621  NA 134*   < > 138   < > 139 142 142  K 3.6   < > 3.7   < > 3.6 3.4* 3.5  CL 98   < > 102   < > 102 106 97*  CO2 24   < > 26   < > 28 29 35*  GLUCOSE 143*   < > 91   < > 96 92 97  BUN 26*   < > 24*   < > 21 15 15   CREATININE 1.24*   < > 1.01*   < > 0.89 0.75 0.96  CALCIUM 8.5*   < > 8.5*   < > 8.4* 8.2* 8.5*  PROT 6.7  --  5.9*  --   --   --   --  ALBUMIN 3.2*  --  2.3*  --   --   --   --   AST 24  --  24  --   --   --   --   ALT 16  --  17  --   --   --   --   ALKPHOS 62  --  68  --   --   --   --   BILITOT 1.0  --  0.6  --   --   --   --   GFRNONAA 37*   < > 48*   < > 56* >60 51*  GFRAA 43*   < > 56*   < > >60 >60 59*  ANIONGAP 12   < > 10   < > 9 7 10    < > = values in this interval not displayed.     Hematology Recent Labs  Lab 10/24/19 0357 10/25/19 0408 10/26/19 0621  WBC 8.8 5.8 6.2  RBC 2.60* 2.57* 2.85*  HGB 8.4* 8.3* 9.2*  HCT 25.9* 25.7* 27.8*  MCV 99.6 100.0 97.5  MCH 32.3 32.3 32.3  MCHC 32.4 32.3 33.1  RDW 19.3* 19.3* 18.6*  PLT 128* 128* PLATELET CLUMPS NOTED ON SMEAR, COUNT APPEARS ADEQUATE    BNP Recent Labs  Lab 10/21/19 0319  BNP 275.2*     DDimer No results for input(s): DDIMER in the last 168 hours.   Radiology    No results found.  Cardiac Studies   10/22/19  1. Left ventricular ejection fraction, by estimation, is 50 to 55%. The left ventricle has low normal function. The left ventricle has no regional wall motion abnormalities. There is mild left ventricular hypertrophy. Left ventricular diastolic parameters are indeterminate. 2. Right ventricular systolic function is mildly reduced. The right ventricular size is  moderately enlarged. There is moderately elevated pulmonary artery systolic pressure. The estimated right ventricular systolic pressure is [id number]. 3. Left atrial size was severely dilated. 4. Right atrial size was severely dilated. 5. Moderate pleural effusion in the left lateral region. 6. The mitral valve is normal in structure. Trivial mitral valve regurgitation. 7. The tricuspid valve is abnormal. Tricuspid valve regurgitation is severe. 8. The aortic valve is tricuspid. Aortic valve regurgitation is not visualized. Mild to moderate aortic valve sclerosis/calcification is present, without any evidence of aortic stenosis. 9. Aortic dilatation noted. There is dilatation of the ascending aorta measuring 42 mm. 10. The inferior vena cava is dilated in size with <50% respiratory variability, suggesting right atrial pressure of 15 mmHg.  Patient Profile        [name] is a 86 y.o. female with a hx of CAD s/p PCI to [place], permanent atrial fibrillation on Coumadin, HTN, HLD, COPD and AAA who is being seen today for the evaluation of preoperative cardiac risk in the setting of right calcaneus osteomyelitis at the request of [place].   Assessment & Plan    Pt POD 1 for L BKA     1   Atrial fibrillation  Permanent  Rates have been marginally controlled due to BP   Will try to incease metoprolol to qid  Patient was on coumadin prior to surgery  Has been restarted  2  Acute on chronic diastolic CHF  Volume status is pretty good  On small dose of lasix PO       For questions or updates, please contact [facility] Please consult [email or web address] for contact info under  Signed, Dorris Carnes, MD  10/27/2019, 6:39 AM

## 2019-10-27 NOTE — TOC Progression Note (Signed)
Transition of Care [facility]) - Progression Note    Patient Details  Name: [name] MRN: [id number] Date of Birth: 1924/04/15  Transition of Care [facility]) CM/SW [place], LCSW Phone Number: 10/27/2019, 5:39 PM  Clinical Narrative:    CSW received call from patient's granddaughter, Colletta Maryland. She has selected [facility]. They will begin insurance authorization.     Expected Discharge Plan: Skilled Nursing Facility Barriers to Discharge: Insurance Authorization  Expected Discharge Plan and Services Expected Discharge Plan: [place] In-house Referral: Clinical Social Work   Post Acute Care Choice: [place] Living arrangements for the past 2 months: Single Family Home                                       Social Determinants of Health (SDOH) Interventions    Readmission Risk Interventions Readmission Risk Prevention Plan 10/26/2019  Transportation Screening Complete  PCP or Specialist Appt within 3-5 Days Complete  HRI or [place] Complete  Social Work Consult for [place] Planning/Counseling Complete  Palliative Care Screening Not Applicable  Medication Review Press photographer) Complete  Some recent data might be hidden

## 2019-10-27 NOTE — Progress Notes (Signed)
     [name] is a 86 y.o. female   Orthopaedic diagnosis: POD 1 s/p Right BKA  Subjective: Pain controlled.  Refused to work with PT.   Trude Mcburney: Vitals:   10/27/19 1020 10/27/19 1212  BP:  94/61  Pulse: (!) 107 100  Resp: 16 19  Temp:  98.1 F (36.7 C)  SpO2: 95% 96%     Assessment: POD 1 s/p BKA   Plan: Pain controlled Okay to restart blood thinner Keep dressing in place Okay to discharge from orthopedic standpoint once cleared by the hospitalist team. Patient will follow up in 2 weeks for wound check and suture removal if appropriate Call the office with concerns   Radene Journey, MD

## 2019-10-27 NOTE — Progress Notes (Signed)
  Speech Language Pathology Treatment: Dysphagia  Patient Details Name: [name] MRN: [id number] DOB: 19-Oct-1923 Today's Date: 10/27/2019 Time: [id number] SLP Time Calculation (min) (ACUTE ONLY): 25 min  Assessment / Plan / Recommendation Clinical Impression  Assisted pt with am meal. She is nauseous this am and has little interest in food. She consumed a few oz of solids and was noted to have dry mouth and diffuse residue. Provided total assist feeding to demonstrate benefit of liquid wash, or following dry solids with a wetter texture, like grits. Pt took small sips of nectar thick liquids via cup with total assist without coughing. She is not capable of independent understanding of dysphagia or precautions today. Needs to continue current diet with external structure for precautions and strategies. Will f/u for needs while admitted, but do not anticipate upgrade in acute setting.   HPI HPI: Pt is a 86 y.o. female with medical history significant of HTN, HLD, persistent atrial fibrillation on Coumadin, CAD s/p stent, COPD/asthma, and anxiety who presented with complaints of shortness of breath. CT chest: Intermediate sized bilateral pleural effusions with left basilar atelectasis. Pt was noted to have "issues" swallowing pills in ED.      SLP Plan  Continue with current plan of care       Recommendations  Diet recommendations: Nectar-thick liquid;Dysphagia 3 (mechanical soft) Liquids provided via: Cup;Teaspoon;No straw Medication Administration: Whole meds with puree Supervision: Patient able to self feed Compensations: Small sips/bites;Slow rate                Oral Care Recommendations: Oral care BID Follow up Recommendations: Other (comment) Plan: Continue with current plan of care       GO               Herbie [place], MA [place] Pager [phone] Office [phone]  Lynann Beaver 10/27/2019, 11:20 AM

## 2019-10-27 NOTE — Progress Notes (Signed)
ANTICOAGULATION - Follow Up Consult  Pharmacy Consult for Warfarin Indication: atrial fibrillation  Allergies  Allergen Reactions  . Alphagan [Brimonidine] Other (See Comments)    Burning sensation  . Apraclonidine Other (See Comments)    Pain, brow pain, tender, not able to tolerate  . Biaxin [Clarithromycin] Other (See Comments)    Unknown, per pt   . Brovana [Arformoterol] Other (See Comments)    Makes my heart race  . Budesonide Other (See Comments)    Makes my heart race  . Cefdinir Other (See Comments)    "FEEL HORRIBLE"  . Cephalexin Itching    Pt tolerated Rocephin 08/2019  . Cifenline Itching  . Clonidine Other (See Comments)    unknown  . Clonidine Derivatives Other (See Comments)    REACTION: unknown  . Dorzolamide Other (See Comments)    Redness  . Iodinated Diagnostic Agents Other (See Comments)    unknown  . Iohexol Other (See Comments)     Desc: NOTES FROM PRIOR CT STATES PRE MEDICATION PRIOR TO OMNIPAQUE ENHANCED CT   . Nsaids Other (See Comments)    Unknown  . Penicillins Itching, Swelling and Other (See Comments)    Swelling and edema of ears and rash Swelling to ears  Has patient had a PCN reaction causing immediate rash, facial/tongue/throat swelling, SOB or lightheadedness with hypotension: {no Has patient had a PCN reaction causing severe rash involving mucus membranes or skin necrosis: {no Has patient had a PCN reaction that required hospitalization no Has patient had a PCN reaction occurring within the last 10 years: no If all of the above answers are "NO", then may proceed with Cephalosporin use.  . Sulfamethoxazole Other (See Comments)    Unknown  . Sulfonamide Derivatives Itching  . Tetracycline Itching and Other (See Comments)    unknown  . Timolol Other (See Comments)    Eye red and irritated   . Vancomycin Other (See Comments)    Turn red  . Zetia [Ezetimibe] Other (See Comments)    Unknown  . Cardizem [Diltiazem Hcl] Rash     Patient Measurements: Height: 5\' 5"  (165.1 cm) Weight: 56.7 kg (125 lb) IBW/kg (Calculated) : 57  Vital Signs: Temp: 98.1 F (36.7 C) (04/23 1212) Temp Source: Oral (04/23 1212) BP: 94/61 (04/23 1212) Pulse Rate: 100 (04/23 1212)  Labs: Recent Labs    10/25/19 0408 10/25/19 0408 10/25/19 1803 10/26/19 0621 10/27/19 0619  HGB 8.3*   < >  --  9.2* 8.8*  HCT 25.7*  --   --  27.8* 27.7*  PLT 128*  --   --  PLATELET CLUMPS NOTED ON SMEAR, COUNT APPEARS ADEQUATE 146*  LABPROT 48.3*   < > 37.0* 23.4* 19.5*  INR 5.3*   < > 3.7* 2.1* 1.7*  CREATININE 0.75  --   --  0.96 1.06*   < > = values in this interval not displayed.    Estimated Creatinine Clearance: 29.7 mL/min (A) (by C-G formula based on SCr of 1.06 mg/dL (H)).  Assessment:   58 YOF presenting with SOB, on warfarin for Afib.  INR on admission supratherapeutic at 9.3, received vitamin K 2.5 mg PO on 4/17. INR was slow to decline > only down to 5.3 on 4/21. Received Vitamin K 5 mg on 4/21 and 2.5 mg 4/21 pm > down to 2.1 on 4/22 and now s/p R BKA.  Warfarin to resume today.     PTA warfarin regimen: 4 mg daily.  Last dose 4/16 prior  to admit.     Bacteroides in blood culture 4/22 and now on Flagyl and Ceftriaxone.     Expect more sensitive to Warfarin doses while on Flagyl, but also received Vitamin K on 4/17 and 4/21.   Goal of Therapy:  INR 2-3 Monitor platelets by anticoagulation protocol: Yes    Plan:   Warfarin 3 mg x 1 today.  Watch for sensitivity due to concurrent Flagyl, but also recent Vit K doses so may need higher dose.  Daily PT/INR.  Noted planning 14 days of Flagyl. Will also need to follow closely when Flagyl course ends.     Arty Baumgartner, [place] Phone: [phone] 10/27/2019,2:43 PM

## 2019-10-28 DIAGNOSIS — I4891 Unspecified atrial fibrillation: Secondary | ICD-10-CM

## 2019-10-28 LAB — CBC
HCT: 26.4 % — ABNORMAL LOW (ref 36.0–46.0)
Hemoglobin: 8.3 g/dL — ABNORMAL LOW (ref 12.0–15.0)
MCH: 30.9 pg (ref 26.0–34.0)
MCHC: 31.4 g/dL (ref 30.0–36.0)
MCV: 98.1 fL (ref 80.0–100.0)
Platelets: 146 10*3/uL — ABNORMAL LOW (ref 150–400)
RBC: 2.69 MIL/uL — ABNORMAL LOW (ref 3.87–5.11)
RDW: 18.4 % — ABNORMAL HIGH (ref 11.5–15.5)
WBC: 7.8 10*3/uL (ref 4.0–10.5)
nRBC: 0 % (ref 0.0–0.2)

## 2019-10-28 LAB — PROTIME-INR
INR: 1.7 — ABNORMAL HIGH (ref 0.8–1.2)
Prothrombin Time: 20.1 seconds — ABNORMAL HIGH (ref 11.4–15.2)

## 2019-10-28 LAB — BASIC METABOLIC PANEL
Anion gap: 8 (ref 5–15)
BUN: 23 mg/dL (ref 8–23)
CO2: 36 mmol/L — ABNORMAL HIGH (ref 22–32)
Calcium: 8.3 mg/dL — ABNORMAL LOW (ref 8.9–10.3)
Chloride: 98 mmol/L (ref 98–111)
Creatinine, Ser: 1.04 mg/dL — ABNORMAL HIGH (ref 0.44–1.00)
GFR calc Af Amer: 54 mL/min — ABNORMAL LOW (ref >60)
GFR calc non Af Amer: 46 mL/min — ABNORMAL LOW (ref >60)
Glucose, Bld: 98 mg/dL (ref 70–99)
Potassium: 3.9 mmol/L (ref 3.5–5.1)
Sodium: 142 mmol/L (ref 135–145)

## 2019-10-28 MED ORDER — WARFARIN SODIUM 3 MG PO TABS
3.0000 mg | ORAL_TABLET | Freq: Once | ORAL | Status: AC
  Administered 2019-10-28: 3 mg via ORAL
  Filled 2019-10-28: qty 1

## 2019-10-28 NOTE — Progress Notes (Signed)
ANTICOAGULATION - Follow Up Consult  Pharmacy Consult for Warfarin Indication: atrial fibrillation  Allergies  Allergen Reactions  . Alphagan [Brimonidine] Other (See Comments)    Burning sensation  . Apraclonidine Other (See Comments)    Pain, brow pain, tender, not able to tolerate  . Biaxin [Clarithromycin] Other (See Comments)    Unknown, per pt   . Brovana [Arformoterol] Other (See Comments)    Makes my heart race  . Budesonide Other (See Comments)    Makes my heart race  . Cefdinir Other (See Comments)    "FEEL HORRIBLE"  . Cephalexin Itching    Pt tolerated Rocephin 08/2019  . Cifenline Itching  . Clonidine Other (See Comments)    unknown  . Clonidine Derivatives Other (See Comments)    REACTION: unknown  . Dorzolamide Other (See Comments)    Redness  . Iodinated Diagnostic Agents Other (See Comments)    unknown  . Iohexol Other (See Comments)     Desc: NOTES FROM PRIOR CT STATES PRE MEDICATION PRIOR TO OMNIPAQUE ENHANCED CT   . Nsaids Other (See Comments)    Unknown  . Penicillins Itching, Swelling and Other (See Comments)    Swelling and edema of ears and rash Swelling to ears  Has patient had a PCN reaction causing immediate rash, facial/tongue/throat swelling, SOB or lightheadedness with hypotension: {no Has patient had a PCN reaction causing severe rash involving mucus membranes or skin necrosis: {no Has patient had a PCN reaction that required hospitalization no Has patient had a PCN reaction occurring within the last 10 years: no If all of the above answers are "NO", then may proceed with Cephalosporin use.  . Sulfamethoxazole Other (See Comments)    Unknown  . Sulfonamide Derivatives Itching  . Tetracycline Itching and Other (See Comments)    unknown  . Timolol Other (See Comments)    Eye red and irritated   . Vancomycin Other (See Comments)    Turn red  . Zetia [Ezetimibe] Other (See Comments)    Unknown  . Cardizem [Diltiazem Hcl] Rash     Patient Measurements: Height: 5\' 5"  (165.1 cm) Weight: 55.2 kg (121 lb 11.1 oz) IBW/kg (Calculated) : 57  Vital Signs: Temp: 98.9 F (37.2 C) (04/24 0800) Temp Source: Oral (04/24 0800) BP: 117/64 (04/24 0800) Pulse Rate: 99 (04/24 0800)  Labs: Recent Labs    10/26/19 0621 10/26/19 0621 10/27/19 0619 10/28/19 0432  HGB 9.2*   < > 8.8* 8.3*  HCT 27.8*  --  27.7* 26.4*  PLT PLATELET CLUMPS NOTED ON SMEAR, COUNT APPEARS ADEQUATE  --  146* 146*  LABPROT 23.4*  --  19.5* 20.1*  INR 2.1*  --  1.7* 1.7*  CREATININE 0.96  --  1.06* 1.04*   < > = values in this interval not displayed.    Estimated Creatinine Clearance: 29.5 mL/min (A) (by C-G formula based on SCr of 1.04 mg/dL (H)).  Assessment:   88 YOF presenting with SOB, on warfarin for Afib. PTA warfarin regimen: 4 mg daily. Last dose 4/16 prior to admit.  INR on admission supratherapeutic at 9.3, received vitamin K 2.5 mg PO on 4/17.  INR was slow to decline > only down to 5.3 on 4/21. Received Vitamin K 5 mg on 4/21 and 2.5 mg 4/21 pm > down to 2.1 on 4/22 and now s/p R BKA.  Warfarin resumed 4/23. - Bacteroides in blood culture 4/22 and now on Flagyl and Ceftriaxone. - Expect more sensitive to Warfarin doses  while on Flagyl, but also received Vitamin K on 4/17 and 4/21. - INR 1.7 today after warfarin 3 mg given yesterday. Will dose dose conservatively given recent supratherapeutic INR requiring Vitamin K and potential drug interaction with Flagyl. No bleeding noted.    Goal of Therapy:  INR 2-3 Monitor platelets by anticoagulation protocol: Yes    Plan:   Warfarin 3 mg x 1 today.  Watch for sensitivity due to concurrent Flagyl  Daily PT/INR.  Noted planning 14 days of Flagyl. Will also need to follow closely when Flagyl course ends.    Agnes Lawrence, PharmD PGY1 Pharmacy Resident

## 2019-10-28 NOTE — Progress Notes (Signed)
Physical Therapy Evaluation Patient Details Name: [name] MRN: [id number] DOB: Nov 27, 1923 Today's Date: 10/28/2019   History of Present Illness  Patient is a 86 y.o. female with history of chronic atrial fibrillation-on Coumadin, chronic diastolic heart failure, CAD history of remote PCI, COPD/asthma overlap presented with shortness of breath-thought to be secondary to decompensated diastolic heart failure, A. fib with RVR-and also found to have osteomyelitis of the right calcaneus. Planned surgical intervention of R BKA planned.   Clinical Impression  Pt now s/p R BKA.  She is needing max assist for mobility, but did well given her new status.  Pt currently limited functionally due to the problems listed. ( See problems list.)   Pt will benefit from PT to maximize function and safety in order to get ready for next venue listed below.     Follow Up Recommendations SNF    Equipment Recommendations  Other (comment);Wheelchair cushion (measurements PT);Wheelchair (measurements PT);3in1 (PT)    Recommendations for Other Services       Precautions / Restrictions Precautions Precautions: Fall Precaution Comments: R BKA Restrictions Weight Bearing Restrictions: Yes RLE Weight Bearing: Non weight bearing      Mobility  Bed Mobility Overal bed mobility: Needs Assistance Bed Mobility: Supine to Sit     Supine to sit: Max assist;+2 for safety/equipment     General bed mobility comments: assist for scooting hips and trunk elevation, VCs for technique  Transfers Overall transfer level: Needs assistance Equipment used: 1 person hand held assist Transfers: Sit to/from [facility] Sit to Stand: Max assist;+2 safety/equipment Stand pivot transfers: Max assist;+2 safety/equipment       General transfer comment: assist to rise and pivot on L foot, pt able to tolerate static standing with PT assist while OT assisting with pericare after BM with initial  stand   Ambulation/Gait             General Gait Details: NT  Stairs            Wheelchair Mobility    Modified Rankin (Stroke Patients Only)       Balance Overall balance assessment: Needs assistance Sitting-balance support: Bilateral upper extremity supported;Feet supported Sitting balance-Leahy Scale: Fair Sitting balance - Comments: working on upright posture and balance EOB as pt with posterior pelvic tilt      Standing balance-Leahy Scale: Poor Standing balance comment: reliant on external assist                             Pertinent Vitals/Pain Pain Assessment: Faces Faces Pain Scale: Hurts a little bit Pain Location: RLE Pain Descriptors / Indicators: Discomfort Pain Intervention(s): Limited activity within patient's tolerance;Monitored during session    Home Living Family/patient expects to be discharged to:: Private residence Living Arrangements: Other (Comment);Alone(dog) Available Help at Discharge: Family;Other (Comment)(dtr come when needed) Type of Home: House Home Access: Stairs to enter Entrance Stairs-Rails: Right Entrance Stairs-Number of Steps: 1 Home Layout: One level Home Equipment: Walker - 4 wheels;Shower seat      Prior Function Level of Independence: Independent with assistive device(s)         Comments: modI with 4WW, assist with negotiating step in/out of the house, dtrs assist with cleaning, eats mostly frozen food     Hand Dominance   Dominant Hand: Right    Extremity/Trunk Assessment   Upper Extremity Assessment Upper Extremity Assessment: Generalized weakness    Lower Extremity Assessment Lower Extremity Assessment:  RLE deficits/detail RLE Deficits / Details: grossly 3-/5, moving aa through functional range, iknee flexion to 80* and knee ext -10* RLE Coordination: decreased fine motor;decreased gross motor LLE Deficits / Details: grossly 3-/5 LLE Coordination: decreased fine motor;decreased  gross motor       Communication   Communication: No difficulties  Cognition Arousal/Alertness: Awake/alert Behavior During Therapy: Flat affect;WFL for tasks assessed/performed Overall Cognitive Status: Within Functional Limits for tasks assessed                                        General Comments      Exercises General Exercises - Upper Extremity Shoulder Flexion: AROM Elbow Flexion: AROM;5 reps;Other (comment)(light resistance provided) Elbow Extension: AROM;Both(light resistance provided) Amputee Exercises Quad Sets: AROM;AAROM;Right;10 reps;Supine Hip Extension: AROM;Right;10 reps;Supine Hip Flexion/Marching: AAROM;10 reps;Supine Knee Flexion: AAROM;AROM;Right;10 reps;Supine Other Exercises Other Exercises: chest press with graded resistance bil UEs x10   Assessment/Plan    PT Assessment Patient needs continued PT services  PT Problem List Decreased strength;Decreased activity tolerance;Decreased balance;Decreased mobility;Decreased knowledge of use of DME;Pain       PT Treatment Interventions DME instruction;Gait training;Functional mobility training;Therapeutic activities;Therapeutic exercise;Balance training;Patient/family education;Wheelchair mobility training    PT Goals (Current goals can be found in the Care Plan section)  Acute Rehab PT Goals Patient Stated Goal: be able to go home  PT Goal Formulation: With patient Time For Goal Achievement: 11/11/19 Potential to Achieve Goals: Fair    Frequency Min 2X/week   Barriers to discharge        Co-evaluation PT/OT/SLP Co-Evaluation/Treatment: Yes Reason for Co-Treatment: Complexity of the patient's impairments (multi-system involvement) PT goals addressed during session: Mobility/safety with mobility OT goals addressed during session: ADL's and self-care       AM-PAC PT "6 Clicks" Mobility  Outcome Measure Help needed turning from your back to your side while in a flat bed without  using bedrails?: Total Help needed moving from lying on your back to sitting on the side of a flat bed without using bedrails?: Total Help needed moving to and from a bed to a chair (including a wheelchair)?: Total Help needed standing up from a chair using your arms (e.g., wheelchair or bedside chair)?: Total Help needed to walk in hospital room?: Total Help needed climbing 3-5 steps with a railing? : Total 6 Click Score: 6    End of Session   Activity Tolerance: Patient limited by fatigue;Patient tolerated treatment well Patient left: in chair;with call bell/phone within reach;with chair alarm set Nurse Communication: Mobility status PT Visit Diagnosis: Other abnormalities of gait and mobility (R26.89);Muscle weakness (generalized) (M62.81);Difficulty in walking, not elsewhere classified (R26.2);Pain Pain - part of body: (right knee/stump)    Time: [id number] PT Time Calculation (min) (ACUTE ONLY): 31 min   Charges:   PT Evaluation $PT Re-evaluation: 1 Re-eval          10/28/2019  Ginger Carne., PT Acute Rehabilitation Services [phone]  (pager) [phone]  (office)  Tessie Fass Kayla Deshaies 10/28/2019, 1:37 PM

## 2019-10-28 NOTE — Progress Notes (Signed)
PROGRESS NOTE        PATIENT DETAILS Name: [name] Age: 86 y.o. Sex: female Date of Birth: February 19, 1924 Admit Date: 10/21/2019 Admitting Physician Norval Morton, MD [id number], Lenna Sciara, PA-C  Brief Narrative:  Patient is a 86 y.o. female with history of chronic atrial fibrillation-on Coumadin, chronic diastolic heart failure, CAD history of remote PCI, COPD/asthma overlap presented with shortness of breath-thought to be secondary to decompensated diastolic heart failure, A. fib with RVR-and also found to have osteomyelitis of the right calcaneus.  See below for further details.  Significant events: 4/17>> admit to [facility]-osteomyelitis of right calcaneus, A. fib with RVR, decompensated heart failure and coagulopathy 4/18>> TTE-EF [id number], RV systolic function mildly reduced-RVSP 55.2 4/22>> right BKA  Antimicrobial therapy: Flagyl 4/21>>4/22 Cefepime: 4/20>> Vancomycin 4/17>> 4/20 Rocephin 4/17>> 4/20, 4/22>>  Microbiology data: 4/7>> blood culture: Negative  Procedures : 4/22>> right BKA  Consults: Orthopedics  DVT Prophylaxis : On Warfarin  Subjective: Lying comfortably in bed.  No major issues overnight.  No further nausea, constipation has resolved.  .  Assessment/Plan:  SIRS secondary to right lower extremity soft tissue infection with osteomyelitis and resultant pathological fracture of the right calcaneus:  - SIRS physiology has resolved-after extensive discussion patient/family-underwent right BKA on 4/22  Bacteroides bacteremia:  - 1 out of 2 blood cultures positive for Bacteroides ovatus-discussed with ID MD-switch to Rocephin and Flagyl.  Keep on this regimen during hospital stay, and upon discharge she will be discharged on oral Flagyl for total of 14 days of Flagyl.  Chronic atrial fibrillation with RVR: -Heart rate remains uncontrolled , this morning she is in the 120s, overall fluctuating over last 24 hours, but  appears to be in the 1 teens, management per cardiology, continue with current dose metoprolol 25 mg 4 times daily, unable to increase given soft blood pressure.   -On warfarin for anticoagulation.  Supratherapeutic INR:  -Secondary to Coumadin use-no evidence of bleeding-given several doses of vitamin K-as patient was undergoing surgery on 4/21.  She is back on warfarin  Acute hypoxic respiratory failure secondary to decompensated diastolic heart failure:  -Continue with low-dose Lasix per cardiology recommendation.  ..  AKI: Improved-likely hemodynamically mediated  Transient hypotension: Likely secondary to medication use-monitor for now.  BP now stable.  Pulmonary hypertension: Likely secondary to COPD.  Doubt further work-up/invasive therapy will change management or outcome in this frail elderly patient.   COPD/asthma overlap: Some scattered wheezing today-stable and not in exacerbation-continue bronchodilators-encourage incentive spirometry  Anemia: Suspect has chronic anemia due to osteomyelitis/chronic inflammation-worsened by acute illness.  No indication for transfusion-follow for now.  Anxiety: Appears stable-continue as needed Ativan  Dysphagia: Chronic issue-appreciate SLP evaluation  Hypokalemia:  repleted  4.5 cm ascending aortic aneurysm seen on CT chest: Stable for outpatient follow-up  Diet: Diet Order            DIET DYS 3 Room service appropriate? No; Fluid consistency: Nectar Thick  Diet effective now               Code Status:  DNR  Family Communication: Daughter over the phone on 4/23  Disposition Plan:  SNF when ready for discharge, hopefully in 1 to 2 days.  Barriers to Discharge: Heart rate remains uncontrolled.  Antimicrobial agents: Anti-infectives (From admission, onward)   Start     Dose/Rate Route Frequency  Ordered Stop   10/26/19 1800  cefTRIAXone (ROCEPHIN) 2 g in sodium chloride 0.9 % 100 mL IVPB     2 g 200 mL/hr over 30  Minutes Intravenous Every 24 hours 10/26/19 1436     10/25/19 1800  ceFEPIme (MAXIPIME) 2 g in sodium chloride 0.9 % 100 mL IVPB  Status:  Discontinued     2 g 200 mL/hr over 30 Minutes Intravenous Every 12 hours 10/25/19 0854 10/26/19 1436   10/25/19 0845  metroNIDAZOLE (FLAGYL) tablet 500 mg     500 mg Oral Every 8 hours 10/25/19 0840     10/24/19 2200  vancomycin (VANCOCIN) IVPB 1000 mg/200 mL premix  Status:  Discontinued     1,000 mg 100 mL/hr over 120 Minutes Intravenous Every 36 hours 10/24/19 1013 10/25/19 0840   10/24/19 1645  ceFEPIme (MAXIPIME) 2 g in sodium chloride 0.9 % 100 mL IVPB  Status:  Discontinued     2 g 200 mL/hr over 30 Minutes Intravenous Every 8 hours 10/24/19 1644 10/25/19 0854   10/23/19 0800  vancomycin (VANCOCIN) IVPB 1000 mg/200 mL premix  Status:  Discontinued     1,000 mg 100 mL/hr over 120 Minutes Intravenous Every 48 hours 10/21/19 0652 10/24/19 1013   10/22/19 0800  cefTRIAXone (ROCEPHIN) 2 g in sodium chloride 0.9 % 100 mL IVPB  Status:  Discontinued     2 g 200 mL/hr over 30 Minutes Intravenous Every 24 hours 10/21/19 0647 10/24/19 1644   10/21/19 0700  vancomycin (VANCOREADY) IVPB 1250 mg/250 mL     1,250 mg 83.3 mL/hr over 180 Minutes Intravenous  Once 10/21/19 0630 10/21/19 1140   10/21/19 0700  cefTRIAXone (ROCEPHIN) 1 g in sodium chloride 0.9 % 100 mL IVPB     1 g 200 mL/hr over 30 Minutes Intravenous  Once 10/21/19 0647 10/21/19 1225   10/21/19 0515  cefTRIAXone (ROCEPHIN) 1 g in sodium chloride 0.9 % 100 mL IVPB     1 g 200 mL/hr over 30 Minutes Intravenous  Once 10/21/19 0501 10/21/19 0646       MEDICATIONS: Scheduled Meds: . bacitracin   Topical Daily  . Chlorhexidine Gluconate Cloth  6 each Topical Q0600  . citalopram  10 mg Oral Daily  . fluticasone furoate-vilanterol  1 puff Inhalation Daily  . furosemide  20 mg Oral Daily  . latanoprost  1 drop Both Eyes QHS  . metoprolol tartrate  25 mg Oral QID  . metroNIDAZOLE  500 mg Oral  Q8H  . montelukast  10 mg Oral QHS  . mupirocin ointment  1 application Nasal BID  . pilocarpine  1 drop Both Eyes BID  . senna-docusate  2 tablet Oral BID  . sodium chloride flush  3 mL Intravenous Q12H  . warfarin  3 mg Oral ONCE-1600  . Warfarin - Pharmacist Dosing Inpatient   Does not apply q1600   Continuous Infusions: . cefTRIAXone (ROCEPHIN)  IV 2 g (10/27/19 1721)  . lactated ringers 10 mL/hr at 10/26/19 1111   PRN Meds:.acetaminophen **OR** acetaminophen, albuterol, ALPRAZolam, diphenhydrAMINE, fluticasone, ondansetron **OR** ondansetron (ZOFRAN) IV, Resource ThickenUp Clear   PHYSICAL EXAM: Vital signs: Vitals:   10/28/19 0500 10/28/19 0800 10/28/19 1225 10/28/19 1409  BP:  117/64 112/68 111/67  Pulse:  99 (!) 107 (!) 104  Resp:  17 20   Temp:  98.9 F (37.2 C) 98.5 F (36.9 C)   TempSrc:  Oral    SpO2:  98% 97%   Weight: 55.2 kg  Height:       Filed Weights   10/27/19 0400 10/27/19 0451 10/28/19 0500  Weight: 53.8 kg 56.7 kg 55.2 kg   Body mass index is 20.25 kg/m.   Awake Alert,  No new F.N deficits, Normal affect Symmetrical Chest wall movement, Good air movement bilaterally, CTAB Irregular irregular,No Gallops,Rubs or new Murmurs, No Parasternal Heave +ve B.Sounds, Abd Soft, No tenderness, No rebound - guarding or rigidity. Right BKA    I have personally reviewed following labs and imaging studies  LABORATORY DATA: CBC: Recent Labs  Lab 10/24/19 0357 10/25/19 0408 10/26/19 0621 10/27/19 0619 10/28/19 0432  WBC 8.8 5.8 6.2 7.9 7.8  HGB 8.4* 8.3* 9.2* 8.8* 8.3*  HCT 25.9* 25.7* 27.8* 27.7* 26.4*  MCV 99.6 100.0 97.5 98.6 98.1  PLT 128* 128* PLATELET CLUMPS NOTED ON SMEAR, COUNT APPEARS ADEQUATE 146* 146*    Basic Metabolic Panel: Recent Labs  Lab 10/22/19 0526 10/22/19 0526 10/23/19 0421 10/23/19 0421 10/24/19 0357 10/25/19 0408 10/26/19 0621 10/27/19 0619 10/28/19 0432  NA 137   < > 138   < > 139 142 142 140 142  K 3.7   < >  3.7   < > 3.6 3.4* 3.5 3.2* 3.9  CL 103   < > 102   < > 102 106 97* 95* 98  CO2 26   < > 26   < > 28 29 35* 37* 36*  GLUCOSE 69*   < > 91   < > 96 92 97 96 98  BUN 22   < > 24*   < > 21 15 15 20 23   CREATININE 1.02*   < > 1.01*   < > 0.89 0.75 0.96 1.06* 1.04*  CALCIUM 8.2*   < > 8.5*   < > 8.4* 8.2* 8.5* 8.2* 8.3*  MG 1.8  --  2.2  --   --   --   --   --   --    < > = values in this interval not displayed.    GFR: Estimated Creatinine Clearance: 29.5 mL/min (A) (by C-G formula based on SCr of 1.04 mg/dL (H)).  Liver Function Tests: Recent Labs  Lab 10/23/19 0421  AST 24  ALT 17  ALKPHOS 68  BILITOT 0.6  PROT 5.9*  ALBUMIN 2.3*   No results for input(s): LIPASE, AMYLASE in the last 168 hours. No results for input(s): AMMONIA in the last 168 hours.  Coagulation Profile: Recent Labs  Lab 10/25/19 0408 10/25/19 1803 10/26/19 0621 10/27/19 0619 10/28/19 0432  INR 5.3* 3.7* 2.1* 1.7* 1.7*    Cardiac Enzymes: No results for input(s): CKTOTAL, CKMB, CKMBINDEX, TROPONINI in the last 168 hours.  BNP (last 3 results) No results for input(s): PROBNP in the last 8760 hours.  Lipid Profile: No results for input(s): CHOL, HDL, LDLCALC, TRIG, CHOLHDL, LDLDIRECT in the last 72 hours.  Thyroid Function Tests: No results for input(s): TSH, T4TOTAL, FREET4, T3FREE, THYROIDAB in the last 72 hours.  Anemia Panel: No results for input(s): VITAMINB12, FOLATE, FERRITIN, TIBC, IRON, RETICCTPCT in the last 72 hours.  Urine analysis:    Component Value Date/Time   COLORURINE YELLOW 04/05/2018 1344   APPEARANCEUR CLEAR 04/05/2018 1344   LABSPEC 1.009 04/05/2018 1344   PHURINE 6.0 04/05/2018 1344   GLUCOSEU NEGATIVE 04/05/2018 1344   HGBUR NEGATIVE 04/05/2018 1344   BILIRUBINUR NEGATIVE 04/05/2018 1344   KETONESUR NEGATIVE 04/05/2018 1344   PROTEINUR NEGATIVE 04/05/2018 1344   UROBILINOGEN 0.2 03/05/2015 1600  NITRITE NEGATIVE 04/05/2018 1344   LEUKOCYTESUR NEGATIVE 04/05/2018  1344    Sepsis Labs: Lactic Acid, Venous    Component Value Date/Time   LATICACIDVEN 0.9 10/21/2019 2111    MICROBIOLOGY: Recent Results (from the past 240 hour(s))  Blood culture (routine x 2)     Status: Abnormal   Collection Time: 10/21/19  6:13 AM   Specimen: BLOOD  Result Value Ref Range Status   Specimen Description BLOOD RIGHT ANTECUBITAL  Final   Special Requests   Final    BOTTLES DRAWN AEROBIC AND ANAEROBIC Blood Culture results may not be optimal due to an excessive volume of blood received in culture bottles   Culture  Setup Time   Final    GRAM NEGATIVE RODS ANAEROBIC BOTTLE ONLY CRITICAL RESULT CALLED TO, READ BACK BY AND VERIFIED WITH: K. NAIK PHARMD, AT [id number] 10/25/19 BY D. VANHOOK    Culture (A)  Final    BACTEROIDES OVATUS BETA LACTAMASE POSITIVE Performed at [facility], [place] [address], [place], [place] [address]    Report Status 10/26/2019 FINAL  Final  Blood culture (routine x 2)     Status: None   Collection Time: 10/21/19  6:13 AM   Specimen: BLOOD RIGHT WRIST  Result Value Ref Range Status   Specimen Description BLOOD RIGHT WRIST  Final   Special Requests   Final    BOTTLES DRAWN AEROBIC AND ANAEROBIC Blood Culture adequate volume   Culture   Final    NO GROWTH 5 DAYS Performed at [facility], [place] [address] [place], [place] [address]    Report Status 10/26/2019 FINAL  Final  Blood Culture ID Panel (Reflexed)     Status: None   Collection Time: 10/21/19  6:13 AM  Result Value Ref Range Status   Enterococcus species NOT DETECTED NOT DETECTED Final   Listeria monocytogenes NOT DETECTED NOT DETECTED Final   Staphylococcus species NOT DETECTED NOT DETECTED Final   Staphylococcus aureus (BCID) NOT DETECTED NOT DETECTED Final   Streptococcus species NOT DETECTED NOT DETECTED Final   Streptococcus agalactiae NOT DETECTED NOT DETECTED Final   Streptococcus pneumoniae NOT DETECTED NOT DETECTED Final   Streptococcus pyogenes NOT DETECTED  NOT DETECTED Final   Acinetobacter baumannii NOT DETECTED NOT DETECTED Final   Enterobacteriaceae species NOT DETECTED NOT DETECTED Final   Enterobacter cloacae complex NOT DETECTED NOT DETECTED Final   Escherichia coli NOT DETECTED NOT DETECTED Final   Klebsiella oxytoca NOT DETECTED NOT DETECTED Final   Klebsiella pneumoniae NOT DETECTED NOT DETECTED Final   Proteus species NOT DETECTED NOT DETECTED Final   Serratia marcescens NOT DETECTED NOT DETECTED Final   Haemophilus influenzae NOT DETECTED NOT DETECTED Final   Neisseria meningitidis NOT DETECTED NOT DETECTED Final   Pseudomonas aeruginosa NOT DETECTED NOT DETECTED Final   Candida albicans NOT DETECTED NOT DETECTED Final   Candida glabrata NOT DETECTED NOT DETECTED Final   Candida krusei NOT DETECTED NOT DETECTED Final   Candida parapsilosis NOT DETECTED NOT DETECTED Final   Candida tropicalis NOT DETECTED NOT DETECTED Final    Comment: Performed at [facility], [address], [place], [place] [address]  SARS CORONAVIRUS 2 (TAT 6-24 HRS) Nasopharyngeal Nasopharyngeal Swab     Status: None   Collection Time: 10/21/19  7:30 AM   Specimen: Nasopharyngeal Swab  Result Value Ref Range Status   SARS Coronavirus 2 NEGATIVE NEGATIVE Final    Comment: (NOTE) SARS-CoV-2 target nucleic acids are NOT DETECTED.  The SARS-CoV-2 RNA is generally detectable in upper and lower respiratory specimens during the acute phase of infection. Negative results do not preclude SARS-CoV-2 infection, do not rule out co-infections with other pathogens, and should not be used as the sole basis for treatment or other patient management decisions. Negative results must be combined with clinical observations, patient history, and epidemiological information. The expected result is Negative. Fact Sheet for Patients: SugarRoll.be Fact Sheet for Healthcare Providers: https://www.woods-mathews.com/ This test is  not yet approved or cleared by the [place] FDA and  has been authorized for detection and/or diagnosis of SARS-CoV-2 by FDA under an Emergency Use Authorization (EUA). This EUA will remain  in effect (meaning this test can be used) for the duration of the COVID-19 declaration under Section 56 4(b)(1) of the Act, 21 U.S.C. section 360bbb-3(b)(1), unless the authorization is terminated or revoked sooner. Performed at [facility], [place] [address], [place], [place] [address]   Surgical PCR screen     Status: Abnormal   Collection Time: 10/26/19  7:19 AM   Specimen: Nasal Mucosa; Nasal Swab  Result Value Ref Range Status   MRSA, PCR NEGATIVE NEGATIVE Final   Staphylococcus aureus POSITIVE (A) NEGATIVE Final    Comment: (NOTE) The Xpert SA Assay (FDA approved for NASAL specimens in patients 14 years of age and older), is one component of a comprehensive surveillance program. It is not intended to diagnose infection nor to guide or monitor treatment. Performed at [facility], [place] [address] [place], [place] [address]     RADIOLOGY STUDIES/RESULTS: No results found.   LOS: 7 days   Phillips Climes, MD  [facility]    To contact the attending provider between 7A-7P or the covering provider during after hours 7P-7A, please log into the web site [email or web address] and access using universal [place] password for that web site. If you do not have the password, please call the hospital operator.  10/28/2019, 2:44 PM

## 2019-10-28 NOTE — Progress Notes (Signed)
Progress Note  Patient Name: [name] Date of Encounter: 10/28/2019  Primary Cardiologist: Fransico Him, MD   Subjective   Sitting up, fairly comfortable.  Feeling a little bit hot.  Took one of her blankets off.  Inpatient Medications    Scheduled Meds: . bacitracin   Topical Daily  . Chlorhexidine Gluconate Cloth  6 each Topical Q0600  . citalopram  10 mg Oral Daily  . fluticasone furoate-vilanterol  1 puff Inhalation Daily  . furosemide  20 mg Oral Daily  . latanoprost  1 drop Both Eyes QHS  . metoprolol tartrate  25 mg Oral QID  . metroNIDAZOLE  500 mg Oral Q8H  . montelukast  10 mg Oral QHS  . mupirocin ointment  1 application Nasal BID  . pilocarpine  1 drop Both Eyes BID  . senna-docusate  2 tablet Oral BID  . sodium chloride flush  3 mL Intravenous Q12H  . warfarin  3 mg Oral ONCE-1600  . Warfarin - Pharmacist Dosing Inpatient   Does not apply q1600   Continuous Infusions: . cefTRIAXone (ROCEPHIN)  IV 2 g (10/27/19 1721)  . lactated ringers 10 mL/hr at 10/26/19 1111   PRN Meds: acetaminophen **OR** acetaminophen, albuterol, ALPRAZolam, diphenhydrAMINE, fluticasone, ondansetron **OR** ondansetron (ZOFRAN) IV, Resource ThickenUp Clear   Vital Signs    Vitals:   10/28/19 0400 10/28/19 0500 10/28/19 0800 10/28/19 1225  BP:   117/64 112/68  Pulse:   99 (!) 107  Resp:   17 20  Temp: 98 F (36.7 C)  98.9 F (37.2 C) 98.5 F (36.9 C)  TempSrc: Oral  Oral   SpO2:   98% 97%  Weight:  55.2 kg    Height:        Intake/Output Summary (Last 24 hours) at 10/28/2019 1254 Last data filed at 10/28/2019 0856 Gross per 24 hour  Intake 620 ml  Output 800 ml  Net -180 ml   Last 3 Weights 10/28/2019 10/27/2019 10/27/2019  Weight (lbs) 121 lb 11.1 oz 125 lb 118 lb 9.7 oz  Weight (kg) 55.2 kg 56.7 kg 53.8 kg      Telemetry    Heart rate currently 125, mostly in the 110 range- Personally Reviewed  ECG    Atrial fibrillation- Personally  Reviewed  Physical Exam   GEN: No acute distress.  Elderly sitting in chair Neck: No JVD Cardiac:  Irregularly irregular tachycardic, no murmurs, rubs, or gallops.  Respiratory: Clear to auscultation bilaterally. GI: Soft, nontender, non-distended  MS: No edema; No deformity. Neuro:  Nonfocal  Psych: Normal affect   Labs    High Sensitivity Troponin:   Recent Labs  Lab 10/21/19 0319 10/21/19 0613  TROPONINIHS 4 6      Chemistry Recent Labs  Lab 10/23/19 0421 10/24/19 0357 10/26/19 0621 10/27/19 0619 10/28/19 0432  NA 138   < > 142 140 142  K 3.7   < > 3.5 3.2* 3.9  CL 102   < > 97* 95* 98  CO2 26   < > 35* 37* 36*  GLUCOSE 91   < > 97 96 98  BUN 24*   < > 15 20 23   CREATININE 1.01*   < > 0.96 1.06* 1.04*  CALCIUM 8.5*   < > 8.5* 8.2* 8.3*  PROT 5.9*  --   --   --   --   ALBUMIN 2.3*  --   --   --   --   AST 24  --   --   --   --  ALT 17  --   --   --   --   ALKPHOS 68  --   --   --   --   BILITOT 0.6  --   --   --   --   GFRNONAA 48*   < > 51* 45* 46*  GFRAA 56*   < > 59* 52* 54*  ANIONGAP 10   < > 10 8 8    < > = values in this interval not displayed.     Hematology Recent Labs  Lab 10/26/19 0621 10/27/19 0619 10/28/19 0432  WBC 6.2 7.9 7.8  RBC 2.85* 2.81* 2.69*  HGB 9.2* 8.8* 8.3*  HCT 27.8* 27.7* 26.4*  MCV 97.5 98.6 98.1  MCH 32.3 31.3 30.9  MCHC 33.1 31.8 31.4  RDW 18.6* 18.2* 18.4*  PLT PLATELET CLUMPS NOTED ON SMEAR, COUNT APPEARS ADEQUATE 146* 146*    BNPNo results for input(s): BNP, PROBNP in the last 168 hours.   DDimer No results for input(s): DDIMER in the last 168 hours.   Radiology    No results found.  Cardiac Studies   Echo EF 55%, secondary pulmonary hypertension  Patient Profile     86 y.o. female with the following issue see below.  Assessment & Plan    Permanent atrial fibrillation -Overall reasonable rate control with metoprolol 4 times daily.  Heart rate on average less than 110.  Currently 125.  Blood  pressures have been relatively soft.  As she continues to heal this should improve.  For now, lets continue with the metoprolol 25 4 times a day as this was just changed yesterday.  Chronic anticoagulation -Coumadin.  Watch for evidence of bleeding postop.  Chronic diastolic heart failure -Small dose of p.o. Lasix.  Continue.     For questions or updates, please contact [place] Please consult [email or web address] for contact info under        Signed, Candee Furbish, MD  10/28/2019, 12:54 PM

## 2019-10-28 NOTE — Evaluation (Signed)
Occupational Therapy Re-Evaluation Patient Details Name: [name] MRN: [id number] DOB: [birth year] Today's Date: 10/28/2019    History of Present Illness Patient is a 86 y.o. female with history of chronic atrial fibrillation-on Coumadin, chronic diastolic heart failure, CAD history of remote PCI, COPD/asthma overlap presented with shortness of breath-thought to be secondary to decompensated diastolic heart failure, A. fib with RVR-and also found to have osteomyelitis of the right calcaneus. Planned surgical intervention of R BKA planned.    Clinical Impression   Pt seen for re-eval s/p R BKA. Pt tolerating session well and able to progress OOB to recliner with two person assist for safe completion of functional transfers. Pt tolerating static standing during completion of pericare after BM (totalA for toileting ADL) and requiring totalA for LB ADL. HR up to 120s with activity. Pt will benefit from continued acute OT services and continue to recommend SNF level therapies at time of discharge to maximize her safety and independence with ADL and mobility. Will follow.     Follow Up Recommendations  SNF;Supervision/Assistance - 24 hour    Equipment Recommendations  Other (comment);Wheelchair (measurements OT);Wheelchair cushion (measurements OT);3 in 1 bedside commode(defer to next venue)    Recommendations for Other Services       Precautions / Restrictions Precautions Precautions: (P) Fall Precaution Comments: (P) R BKA Restrictions Weight Bearing Restrictions: (P) Yes RLE Weight Bearing: (P) Non weight bearing      Mobility Bed Mobility Overal bed mobility: (P) Needs Assistance Bed Mobility: (P) Supine to Sit     Supine to sit: (P) Max assist;+2 for safety/equipment     General bed mobility comments: (P) assist for scooting hips and trunk elevation, VCs for technique  Transfers Overall transfer level: (P) Needs assistance Equipment used: (P) 1 person hand held  assist Transfers: (P) Sit to/from Stand;Stand Pivot Transfers Sit to Stand: (P) Max assist;+2 safety/equipment Stand pivot transfers: (P) Max assist;+2 safety/equipment       General transfer comment: (P) assist to rise and pivot on L foot, pt able to tolerate static standing with PT assist while OT assisting with pericare after BM with initial stand     Balance Overall balance assessment: (P) Needs assistance Sitting-balance support: (P) Bilateral upper extremity supported;Feet supported Sitting balance-Leahy Scale: (P) Fair Sitting balance - Comments: (P) working on upright posture and balance EOB as pt with posterior pelvic tilt      Standing balance-Leahy Scale: (P) Poor Standing balance comment: (P) reliant on external assist                           ADL either performed or assessed with clinical judgement   ADL Overall ADL's : Needs assistance/impaired Eating/Feeding: Set up;Sitting   Grooming: Set up;Sitting                       Toileting- Clothing Manipulation and Hygiene: Total assistance;+2 for physical assistance;Sit to/from stand Toileting - Clothing Manipulation Details (indicate cue type and reason): pt having BM with initial stand from EOB, providing assist for pericare with additional assist for standing balance     Functional mobility during ADLs: Maximal assistance;+2 for safety/equipment(stand pivot)       Vision         Perception     Praxis      Pertinent Vitals/Pain Pain Assessment: (P) Faces Faces Pain Scale: (P) Hurts a little bit Pain Location: (P) RLE Pain Descriptors /  Indicators: (P) Discomfort Pain Intervention(s): Limited activity within patient's tolerance;Monitored during session;Repositioned     Hand Dominance (P) Right   Extremity/Trunk Assessment Upper Extremity Assessment Upper Extremity Assessment: Generalized weakness           Communication Communication Communication: (P) No difficulties    Cognition Arousal/Alertness: (P) Awake/alert Behavior During Therapy: (P) Flat affect;WFL for tasks assessed/performed Overall Cognitive Status: (P) Within Functional Limits for tasks assessed                                     General Comments       Exercises Exercises: (P) General Upper Extremity;Amputee;Other exercises General Exercises - Upper Extremity Shoulder Flexion: (P) AROM Elbow Flexion: AROM;5 reps;Other (comment)(light resistance provided) Elbow Extension: AROM;Both(light resistance provided) Amputee Exercises Hip Flexion/Marching: AAROM;Both;10 reps Other Exercises Other Exercises: chest press with graded resistance bil UEs x10   Shoulder Instructions      Home Living Family/patient expects to be discharged to:: (P) Private residence Living Arrangements: (P) Other (Comment);Alone(dog) Available Help at Discharge: (P) Family;Other (Comment)(dtr come when needed) Type of Home: (P) House Home Access: (P) Stairs to enter Entrance Stairs-Number of Steps: (P) 1 Entrance Stairs-Rails: (P) Right Home Layout: (P) One level     Bathroom Shower/Tub: (P) Walk-in shower   Bathroom Toilet: (P) Handicapped height Bathroom Accessibility: (P) Yes   Home Equipment: (P) Walker - 4 wheels;Shower seat          Prior Functioning/Environment Level of Independence: (P) Independent with assistive device(s)        Comments: (P) modI with 4WW, assist with negotiating step in/out of the house, dtrs assist with cleaning, eats mostly frozen food        OT Problem List: Decreased strength;Decreased activity tolerance;Impaired balance (sitting and/or standing);Decreased coordination;Decreased knowledge of use of DME or AE;Cardiopulmonary status limiting activity;Decreased range of motion;Decreased safety awareness;Decreased knowledge of precautions      OT Treatment/Interventions: Self-care/ADL training;Therapeutic exercise;Energy conservation;DME and/or AE  instruction;Therapeutic activities;Patient/family education;Balance training    OT Goals(Current goals can be found in the care plan section) Acute Rehab OT Goals Patient Stated Goal: (P) be able to go home  OT Goal Formulation: With patient Time For Goal Achievement: 11/08/19 Potential to Achieve Goals: Good  OT Frequency: Min 2X/week   Barriers to D/C:            Co-evaluation PT/OT/SLP Co-Evaluation/Treatment: Yes Reason for Co-Treatment: (P) Complexity of the patient's impairments (multi-system involvement) PT goals addressed during session: (P) Mobility/safety with mobility OT goals addressed during session: (P) ADL's and self-care      AM-PAC OT "6 Clicks" Daily Activity     Outcome Measure Help from another person eating meals?: A Little Help from another person taking care of personal grooming?: A Little Help from another person toileting, which includes using toliet, bedpan, or urinal?: Total Help from another person bathing (including washing, rinsing, drying)?: A Lot Help from another person to put on and taking off regular upper body clothing?: A Little Help from another person to put on and taking off regular lower body clothing?: A Lot 6 Click Score: 14   End of Session Equipment Utilized During Treatment: Oxygen Nurse Communication: Mobility status  Activity Tolerance: Patient tolerated treatment well Patient left: in chair;with call bell/phone within reach;with chair alarm set  OT Visit Diagnosis: Unsteadiness on feet (R26.81);Other abnormalities of gait and mobility (R26.89);Muscle weakness (generalized) (M62.81)  Time: [id number] OT Time Calculation (min): 31 min Charges:  OT General Charges $OT Visit: 1 Visit OT Evaluation $OT Re-eval: 1 Re-eval  Lou Cal, OT Acute Rehabilitation Services Pager [phone] Office [phone]  Raymondo Band 10/28/2019, 1:30 PM

## 2019-10-29 LAB — BASIC METABOLIC PANEL
Anion gap: 9 (ref 5–15)
BUN: 24 mg/dL — ABNORMAL HIGH (ref 8–23)
CO2: 36 mmol/L — ABNORMAL HIGH (ref 22–32)
Calcium: 8.4 mg/dL — ABNORMAL LOW (ref 8.9–10.3)
Chloride: 99 mmol/L (ref 98–111)
Creatinine, Ser: 1.06 mg/dL — ABNORMAL HIGH (ref 0.44–1.00)
GFR calc Af Amer: 52 mL/min — ABNORMAL LOW (ref >60)
GFR calc non Af Amer: 45 mL/min — ABNORMAL LOW (ref >60)
Glucose, Bld: 99 mg/dL (ref 70–99)
Potassium: 3.6 mmol/L (ref 3.5–5.1)
Sodium: 144 mmol/L (ref 135–145)

## 2019-10-29 LAB — CBC
HCT: 26.7 % — ABNORMAL LOW (ref 36.0–46.0)
Hemoglobin: 8.4 g/dL — ABNORMAL LOW (ref 12.0–15.0)
MCH: 31.5 pg (ref 26.0–34.0)
MCHC: 31.5 g/dL (ref 30.0–36.0)
MCV: 100 fL (ref 80.0–100.0)
Platelets: 136 10*3/uL — ABNORMAL LOW (ref 150–400)
RBC: 2.67 MIL/uL — ABNORMAL LOW (ref 3.87–5.11)
RDW: 18.5 % — ABNORMAL HIGH (ref 11.5–15.5)
WBC: 8.9 10*3/uL (ref 4.0–10.5)
nRBC: 0 % (ref 0.0–0.2)

## 2019-10-29 LAB — PROTIME-INR
INR: 2.1 — ABNORMAL HIGH (ref 0.8–1.2)
Prothrombin Time: 23.7 seconds — ABNORMAL HIGH (ref 11.4–15.2)

## 2019-10-29 MED ORDER — WARFARIN SODIUM 2 MG PO TABS
2.0000 mg | ORAL_TABLET | Freq: Once | ORAL | Status: AC
  Administered 2019-10-29: 2 mg via ORAL
  Filled 2019-10-29: qty 1

## 2019-10-29 MED ORDER — ENSURE ENLIVE PO LIQD
237.0000 mL | Freq: Three times a day (TID) | ORAL | Status: DC
  Administered 2019-10-29 – 2019-10-31 (×5): 237 mL via ORAL
  Filled 2019-10-29: qty 237

## 2019-10-29 NOTE — Progress Notes (Signed)
Progress Note  Patient Name: [name] Date of Encounter: 10/29/2019  Primary Cardiologist: Fransico Him, MD   Subjective   No new complaints. No CP, no SOB  Inpatient Medications    Scheduled Meds: . bacitracin   Topical Daily  . Chlorhexidine Gluconate Cloth  6 each Topical Q0600  . citalopram  10 mg Oral Daily  . fluticasone furoate-vilanterol  1 puff Inhalation Daily  . furosemide  20 mg Oral Daily  . latanoprost  1 drop Both Eyes QHS  . metoprolol tartrate  25 mg Oral QID  . metroNIDAZOLE  500 mg Oral Q8H  . montelukast  10 mg Oral QHS  . mupirocin ointment  1 application Nasal BID  . pilocarpine  1 drop Both Eyes BID  . senna-docusate  2 tablet Oral BID  . sodium chloride flush  3 mL Intravenous Q12H  . warfarin  2 mg Oral ONCE-1600  . Warfarin - Pharmacist Dosing Inpatient   Does not apply q1600   Continuous Infusions: . cefTRIAXone (ROCEPHIN)  IV 2 g (10/28/19 1734)  . lactated ringers 10 mL/hr at 10/26/19 1111   PRN Meds: acetaminophen **OR** acetaminophen, albuterol, ALPRAZolam, diphenhydrAMINE, fluticasone, ondansetron **OR** ondansetron (ZOFRAN) IV, Resource ThickenUp Clear   Vital Signs    Vitals:   10/29/19 0400 10/29/19 0500 10/29/19 0800 10/29/19 1045  BP: 122/70  124/80 (!) 114/59  Pulse: (!) 103  (!) 102 (!) 104  Resp: (!) 21  18   Temp: 98.6 F (37 C)  98.3 F (36.8 C)   TempSrc: Oral  Oral   SpO2: 98%  97%   Weight:  54.5 kg    Height:        Intake/Output Summary (Last 24 hours) at 10/29/2019 1128 Last data filed at 10/28/2019 1528 Gross per 24 hour  Intake -  Output 500 ml  Net -500 ml   Last 3 Weights 10/29/2019 10/28/2019 10/27/2019  Weight (lbs) 120 lb 2.4 oz 121 lb 11.1 oz 125 lb  Weight (kg) 54.5 kg 55.2 kg 56.7 kg      Telemetry    Heart rate currently 90, mostly in the 110 range- Personally Reviewed  ECG    Atrial fibrillation- Personally Reviewed  Physical Exam  GEN: Well nourished, well developed, in  no acute distress  HEENT: normal  Neck: no JVD, carotid bruits, or masses Cardiac: irreg mildly tachy; no murmurs, rubs, or gallops,no edema  Respiratory:  clear to auscultation bilaterally, normal work of breathing GI: soft, nontender, nondistended, + BS MS: no deformity or atrophy  Skin: R BKA Neuro:  Alert and Oriented x 3, Strength and sensation are intact Psych: euthymic mood, full affect   Labs    High Sensitivity Troponin:   Recent Labs  Lab 10/21/19 0319 10/21/19 0613  TROPONINIHS 4 6      Chemistry Recent Labs  Lab 10/23/19 0421 10/24/19 0357 10/27/19 0619 10/28/19 0432 10/29/19 0544  NA 138   < > 140 142 144  K 3.7   < > 3.2* 3.9 3.6  CL 102   < > 95* 98 99  CO2 26   < > 37* 36* 36*  GLUCOSE 91   < > 96 98 99  BUN 24*   < > 20 23 24*  CREATININE 1.01*   < > 1.06* 1.04* 1.06*  CALCIUM 8.5*   < > 8.2* 8.3* 8.4*  PROT 5.9*  --   --   --   --   ALBUMIN 2.3*  --   --   --   --  AST 24  --   --   --   --   ALT 17  --   --   --   --   ALKPHOS 68  --   --   --   --   BILITOT 0.6  --   --   --   --   GFRNONAA 48*   < > 45* 46* 45*  GFRAA 56*   < > 52* 54* 52*  ANIONGAP 10   < > 8 8 9    < > = values in this interval not displayed.     Hematology Recent Labs  Lab 10/27/19 [phone] 10/28/19 0432 10/29/19 0544  WBC 7.9 7.8 8.9  RBC 2.81* 2.69* 2.67*  HGB 8.8* 8.3* 8.4*  HCT 27.7* 26.4* 26.7*  MCV 98.6 98.1 100.0  MCH 31.3 30.9 31.5  MCHC 31.8 31.4 31.5  RDW 18.2* 18.4* 18.5*  PLT 146* 146* 136*    BNPNo results for input(s): BNP, PROBNP in the last 168 hours.   DDimer No results for input(s): DDIMER in the last 168 hours.   Radiology    No results found.  Cardiac Studies   Echo EF 55%, secondary pulmonary hypertension  Patient Profile     86 y.o. female with the following issue see below.  Assessment & Plan    Permanent atrial fibrillation -Overall reasonable rate control with metoprolol 4 times daily.  Heart rate on average less than  110.  Currently 90 in room.  Blood pressures have been relatively soft.  As she continues to heal this should improve.  For now, lets continue with the metoprolol 25 4 times a day. No changes. Could consolidate metoprolol tomorrow.   Chronic anticoagulation -Coumadin.  Watch for evidence of bleeding postop. Stable  Chronic diastolic heart failure -Small dose of p.o. Lasix.  Continue. No changes    Right BKA  For questions or updates, please contact [place] Please consult [email or web address] for contact info under        Signed, Candee Furbish, MD  10/29/2019, 11:28 AM

## 2019-10-29 NOTE — Progress Notes (Signed)
ANTICOAGULATION - Follow Up Consult  Pharmacy Consult for Warfarin Indication: atrial fibrillation  Allergies  Allergen Reactions  . Alphagan [Brimonidine] Other (See Comments)    Burning sensation  . Apraclonidine Other (See Comments)    Pain, brow pain, tender, not able to tolerate  . Biaxin [Clarithromycin] Other (See Comments)    Unknown, per pt   . Brovana [Arformoterol] Other (See Comments)    Makes my heart race  . Budesonide Other (See Comments)    Makes my heart race  . Cefdinir Other (See Comments)    "FEEL HORRIBLE"  . Cephalexin Itching    Pt tolerated Rocephin 08/2019  . Cifenline Itching  . Clonidine Other (See Comments)    unknown  . Clonidine Derivatives Other (See Comments)    REACTION: unknown  . Dorzolamide Other (See Comments)    Redness  . Iodinated Diagnostic Agents Other (See Comments)    unknown  . Iohexol Other (See Comments)     Desc: NOTES FROM PRIOR CT STATES PRE MEDICATION PRIOR TO OMNIPAQUE ENHANCED CT   . Nsaids Other (See Comments)    Unknown  . Penicillins Itching, Swelling and Other (See Comments)    Swelling and edema of ears and rash Swelling to ears  Has patient had a PCN reaction causing immediate rash, facial/tongue/throat swelling, SOB or lightheadedness with hypotension: {no Has patient had a PCN reaction causing severe rash involving mucus membranes or skin necrosis: {no Has patient had a PCN reaction that required hospitalization no Has patient had a PCN reaction occurring within the last 10 years: no If all of the above answers are "NO", then may proceed with Cephalosporin use.  . Sulfamethoxazole Other (See Comments)    Unknown  . Sulfonamide Derivatives Itching  . Tetracycline Itching and Other (See Comments)    unknown  . Timolol Other (See Comments)    Eye red and irritated   . Vancomycin Other (See Comments)    Turn red  . Zetia [Ezetimibe] Other (See Comments)    Unknown  . Cardizem [Diltiazem Hcl] Rash     Patient Measurements: Height: 5\' 5"  (165.1 cm) Weight: 54.5 kg (120 lb 2.4 oz) IBW/kg (Calculated) : 57  Vital Signs: Temp: 98.6 F (37 C) (04/25 0400) Temp Source: Oral (04/25 0400) BP: 122/70 (04/25 0400) Pulse Rate: 103 (04/25 0400)  Labs: Recent Labs    10/27/19 0619 10/27/19 0619 10/28/19 0432 10/29/19 0544  HGB 8.8*   < > 8.3* 8.4*  HCT 27.7*  --  26.4* 26.7*  PLT 146*  --  146* 136*  LABPROT 19.5*  --  20.1* 23.7*  INR 1.7*  --  1.7* 2.1*  CREATININE 1.06*  --  1.04* 1.06*   < > = values in this interval not displayed.    Estimated Creatinine Clearance: 28.5 mL/min (A) (by C-G formula based on SCr of 1.06 mg/dL (H)).  Assessment:   30 YOF presenting with SOB, on warfarin for Afib. PTA warfarin regimen: 4 mg daily. Last dose 4/16 prior to admit.  INR on admission supratherapeutic at 9.3, received vitamin K 2.5 mg PO on 4/17.  INR was slow to decline > only down to 5.3 on 4/21. Received Vitamin K 5 mg on 4/21 and 2.5 mg 4/21 pm > down to 2.1 on 4/22 and now s/p R BKA.  Warfarin resumed 4/23.  - Bacteroides in blood culture 4/22 and now on Flagyl and Ceftriaxone. - Expect more sensitive to Warfarin doses while on Flagyl. - INR 2.1  today after warfarin 3 mg given yesterday. Will dose dose conservatively given recent supratherapeutic INR requiring Vitamin K and potential drug interaction with Flagyl. Hemoglobin stable, platelet count down slightly today at 136. No bleeding noted.  Goal of Therapy:  INR 2-3 Monitor platelets by anticoagulation protocol: Yes    Plan:   Warfarin 2 mg x 1 today.  Watch for sensitivity due to concurrent Flagyl  Daily PT/INR.  Noted planning 14 days of Flagyl - Day #5 . Will also need to follow closely when Flagyl course ends.    Agnes Lawrence, PharmD PGY1 Pharmacy Resident

## 2019-10-29 NOTE — Progress Notes (Signed)
PROGRESS NOTE        PATIENT DETAILS Name: [name] Age: 86 y.o. Sex: female Date of Birth: Apr 06, 1924 Admit Date: 10/21/2019 Admitting Physician Norval Morton, MD [id number], Lenna Sciara, PA-C  Brief Narrative:  Patient is a 86 y.o. female with history of chronic atrial fibrillation-on Coumadin, chronic diastolic heart failure, CAD history of remote PCI, COPD/asthma overlap presented with shortness of breath-thought to be secondary to decompensated diastolic heart failure, A. fib with RVR-and also found to have osteomyelitis of the right calcaneus.  See below for further details.  Significant events: 4/17>> admit to [facility]-osteomyelitis of right calcaneus, A. fib with RVR, decompensated heart failure and coagulopathy 4/18>> TTE-EF [id number], RV systolic function mildly reduced-RVSP 55.2 4/22>> right BKA  Antimicrobial therapy: Flagyl 4/21>>4/22 Cefepime: 4/20>> Vancomycin 4/17>> 4/20 Rocephin 4/17>> 4/20, 4/22>>  Microbiology data: 4/7>> blood culture: Negative  Procedures : 4/22>> right BKA  Consults: Orthopedics  DVT Prophylaxis : On Warfarin  Subjective: Lying comfortably in bed.  No major issues overnight.  Does report some nausea this morning, but she reports good bowel movement.  Assessment/Plan:  SIRS secondary to right lower extremity soft tissue infection with osteomyelitis and resultant pathological fracture of the right calcaneus:  - SIRS physiology has resolved-after extensive discussion patient/family-underwent right BKA on 4/22  Bacteroides bacteremia:  - 1 out of 2 blood cultures positive for Bacteroides ovatus-discussed with ID MD-switch to Rocephin and Flagyl.  Keep on this regimen during hospital stay, and upon discharge she will be discharged on oral Flagyl for total of 14 days of Flagyl.  Chronic atrial fibrillation with RVR: -Management per cardiology, but overall reasonable rate control with metoprolol 25 mg  oral 4 times daily, will defer further management for cardiology, likely they would consolidate her metoprolol tomorrow . -On warfarin for anticoagulation.  Supratherapeutic INR:  -Secondary to Coumadin use-no evidence of bleeding-given several doses of vitamin K-as patient was undergoing surgery on 4/21.  She is back on warfarin  Acute hypoxic respiratory failure secondary to decompensated diastolic heart failure:  -Continue with low-dose Lasix per cardiology recommendation.  ..  AKI: Improved-likely hemodynamically mediated  Transient hypotension: Likely secondary to medication use-monitor for now.  BP now stable.  Pulmonary hypertension: Likely secondary to COPD.  Doubt further work-up/invasive therapy will change management or outcome in this frail elderly patient.   COPD/asthma overlap: Some scattered wheezing today-stable and not in exacerbation-continue bronchodilators-encourage incentive spirometry  Anemia: Suspect has chronic anemia due to osteomyelitis/chronic inflammation-worsened by acute illness.  No indication for transfusion-follow for now.  Anxiety: Appears stable-continue as needed Ativan  Dysphagia: Chronic issue-appreciate SLP evaluation  Hypokalemia:  repleted  4.5 cm ascending aortic aneurysm seen on CT chest: Stable for outpatient follow-up  Diet: Diet Order            DIET DYS 3 Room service appropriate? No; Fluid consistency: Nectar Thick  Diet effective now               Code Status:  DNR  Family Communication: Daughter over the phone on 4/23  Disposition Plan:  SNF when bed is available, hopefully by tomorrow   Antimicrobial agents: Anti-infectives (From admission, onward)   Start     Dose/Rate Route Frequency Ordered Stop   10/26/19 1800  cefTRIAXone (ROCEPHIN) 2 g in sodium chloride 0.9 % 100 mL IVPB     2  g 200 mL/hr over 30 Minutes Intravenous Every 24 hours 10/26/19 1436     10/25/19 1800  ceFEPIme (MAXIPIME) 2 g in sodium chloride  0.9 % 100 mL IVPB  Status:  Discontinued     2 g 200 mL/hr over 30 Minutes Intravenous Every 12 hours 10/25/19 0854 10/26/19 1436   10/25/19 0845  metroNIDAZOLE (FLAGYL) tablet 500 mg     500 mg Oral Every 8 hours 10/25/19 0840     10/24/19 2200  vancomycin (VANCOCIN) IVPB 1000 mg/200 mL premix  Status:  Discontinued     1,000 mg 100 mL/hr over 120 Minutes Intravenous Every 36 hours 10/24/19 1013 10/25/19 0840   10/24/19 1645  ceFEPIme (MAXIPIME) 2 g in sodium chloride 0.9 % 100 mL IVPB  Status:  Discontinued     2 g 200 mL/hr over 30 Minutes Intravenous Every 8 hours 10/24/19 1644 10/25/19 0854   10/23/19 0800  vancomycin (VANCOCIN) IVPB 1000 mg/200 mL premix  Status:  Discontinued     1,000 mg 100 mL/hr over 120 Minutes Intravenous Every 48 hours 10/21/19 0652 10/24/19 1013   10/22/19 0800  cefTRIAXone (ROCEPHIN) 2 g in sodium chloride 0.9 % 100 mL IVPB  Status:  Discontinued     2 g 200 mL/hr over 30 Minutes Intravenous Every 24 hours 10/21/19 0647 10/24/19 1644   10/21/19 0700  vancomycin (VANCOREADY) IVPB 1250 mg/250 mL     1,250 mg 83.3 mL/hr over 180 Minutes Intravenous  Once 10/21/19 0630 10/21/19 1140   10/21/19 0700  cefTRIAXone (ROCEPHIN) 1 g in sodium chloride 0.9 % 100 mL IVPB     1 g 200 mL/hr over 30 Minutes Intravenous  Once 10/21/19 0647 10/21/19 1225   10/21/19 0515  cefTRIAXone (ROCEPHIN) 1 g in sodium chloride 0.9 % 100 mL IVPB     1 g 200 mL/hr over 30 Minutes Intravenous  Once 10/21/19 0501 10/21/19 0646       MEDICATIONS: Scheduled Meds: . bacitracin   Topical Daily  . Chlorhexidine Gluconate Cloth  6 each Topical Q0600  . citalopram  10 mg Oral Daily  . feeding supplement (ENSURE ENLIVE)  237 mL Oral TID BM  . fluticasone furoate-vilanterol  1 puff Inhalation Daily  . furosemide  20 mg Oral Daily  . latanoprost  1 drop Both Eyes QHS  . metoprolol tartrate  25 mg Oral QID  . metroNIDAZOLE  500 mg Oral Q8H  . montelukast  10 mg Oral QHS  . mupirocin  ointment  1 application Nasal BID  . pilocarpine  1 drop Both Eyes BID  . senna-docusate  2 tablet Oral BID  . sodium chloride flush  3 mL Intravenous Q12H  . warfarin  2 mg Oral ONCE-1600  . Warfarin - Pharmacist Dosing Inpatient   Does not apply q1600   Continuous Infusions: . cefTRIAXone (ROCEPHIN)  IV 2 g (10/28/19 1734)  . lactated ringers 10 mL/hr at 10/26/19 1111   PRN Meds:.acetaminophen **OR** acetaminophen, albuterol, ALPRAZolam, diphenhydrAMINE, fluticasone, ondansetron **OR** ondansetron (ZOFRAN) IV, Resource ThickenUp Clear   PHYSICAL EXAM: Vital signs: Vitals:   10/29/19 0800 10/29/19 1045 10/29/19 1200 10/29/19 1316  BP: 124/80 (!) 114/59 122/62 127/84  Pulse: (!) 102 (!) 104 97 (!) 102  Resp: 18  20   Temp: 98.3 F (36.8 C)     TempSrc: Oral     SpO2: 97%  97%   Weight:      Height:       [employer]  10/27/19 0451 10/28/19 0500 10/29/19 0500  Weight: 56.7 kg 55.2 kg 54.5 kg   Body mass index is 19.99 kg/m.   Awake Alert, Oriented X 3, No new F.N deficits, Normal affect Symmetrical Chest wall movement, Good air movement bilaterally, CTAB Irregular irregular,No Gallops,Rubs or new Murmurs, No Parasternal Heave +ve B.Sounds, Abd Soft, No tenderness, No rebound - guarding or rigidity. Right BKA   I have personally reviewed following labs and imaging studies  LABORATORY DATA: CBC: Recent Labs  Lab 10/25/19 0408 10/26/19 0621 10/27/19 0619 10/28/19 0432 10/29/19 0544  WBC 5.8 6.2 7.9 7.8 8.9  HGB 8.3* 9.2* 8.8* 8.3* 8.4*  HCT 25.7* 27.8* 27.7* 26.4* 26.7*  MCV 100.0 97.5 98.6 98.1 100.0  PLT 128* PLATELET CLUMPS NOTED ON SMEAR, COUNT APPEARS ADEQUATE 146* 146* 136*    Basic Metabolic Panel: Recent Labs  Lab 10/23/19 0421 10/24/19 0357 10/25/19 0408 10/26/19 0621 10/27/19 0619 10/28/19 0432 10/29/19 0544  NA 138   < > 142 142 140 142 144  K 3.7   < > 3.4* 3.5 3.2* 3.9 3.6  CL 102   < > 106 97* 95* 98 99  CO2 26   < > 29 35* 37* 36*  36*  GLUCOSE 91   < > 92 97 96 98 99  BUN 24*   < > 15 15 20 23  24*  CREATININE 1.01*   < > 0.75 0.96 1.06* 1.04* 1.06*  CALCIUM 8.5*   < > 8.2* 8.5* 8.2* 8.3* 8.4*  MG 2.2  --   --   --   --   --   --    < > = values in this interval not displayed.    GFR: Estimated Creatinine Clearance: 28.5 mL/min (A) (by C-G formula based on SCr of 1.06 mg/dL (H)).  Liver Function Tests: Recent Labs  Lab 10/23/19 0421  AST 24  ALT 17  ALKPHOS 68  BILITOT 0.6  PROT 5.9*  ALBUMIN 2.3*   No results for input(s): LIPASE, AMYLASE in the last 168 hours. No results for input(s): AMMONIA in the last 168 hours.  Coagulation Profile: Recent Labs  Lab 10/25/19 1803 10/26/19 0621 10/27/19 0619 10/28/19 0432 10/29/19 0544  INR 3.7* 2.1* 1.7* 1.7* 2.1*    Cardiac Enzymes: No results for input(s): CKTOTAL, CKMB, CKMBINDEX, TROPONINI in the last 168 hours.  BNP (last 3 results) No results for input(s): PROBNP in the last 8760 hours.  Lipid Profile: No results for input(s): CHOL, HDL, LDLCALC, TRIG, CHOLHDL, LDLDIRECT in the last 72 hours.  Thyroid Function Tests: No results for input(s): TSH, T4TOTAL, FREET4, T3FREE, THYROIDAB in the last 72 hours.  Anemia Panel: No results for input(s): VITAMINB12, FOLATE, FERRITIN, TIBC, IRON, RETICCTPCT in the last 72 hours.  Urine analysis:    Component Value Date/Time   COLORURINE YELLOW 04/05/2018 1344   APPEARANCEUR CLEAR 04/05/2018 1344   LABSPEC 1.009 04/05/2018 1344   PHURINE 6.0 04/05/2018 1344   GLUCOSEU NEGATIVE 04/05/2018 1344   HGBUR NEGATIVE 04/05/2018 1344   BILIRUBINUR NEGATIVE 04/05/2018 1344   KETONESUR NEGATIVE 04/05/2018 1344   PROTEINUR NEGATIVE 04/05/2018 1344   UROBILINOGEN 0.2 03/05/2015 1600   NITRITE NEGATIVE 04/05/2018 1344   LEUKOCYTESUR NEGATIVE 04/05/2018 1344    Sepsis Labs: Lactic Acid, Venous    Component Value Date/Time   LATICACIDVEN 0.9 10/21/2019 2111    MICROBIOLOGY: Recent Results (from the past  240 hour(s))  Blood culture (routine x 2)     Status: Abnormal  Collection Time: 10/21/19  6:13 AM   Specimen: BLOOD  Result Value Ref Range Status   Specimen Description BLOOD RIGHT ANTECUBITAL  Final   Special Requests   Final    BOTTLES DRAWN AEROBIC AND ANAEROBIC Blood Culture results may not be optimal due to an excessive volume of blood received in culture bottles   Culture  Setup Time   Final    GRAM NEGATIVE RODS ANAEROBIC BOTTLE ONLY CRITICAL RESULT CALLED TO, READ BACK BY AND VERIFIED WITH: K. NAIK PHARMD, AT WE:[id number] 10/25/19 BY D. VANHOOK    Culture (A)  Final    BACTEROIDES OVATUS BETA LACTAMASE POSITIVE Performed at [facility], [place] [address], [place], [place] [address]    Report Status 10/26/2019 FINAL  Final  Blood culture (routine x 2)     Status: None   Collection Time: 10/21/19  6:13 AM   Specimen: BLOOD RIGHT WRIST  Result Value Ref Range Status   Specimen Description BLOOD RIGHT WRIST  Final   Special Requests   Final    BOTTLES DRAWN AEROBIC AND ANAEROBIC Blood Culture adequate volume   Culture   Final    NO GROWTH 5 DAYS Performed at [facility], [place] [address], [place], [place] [address]    Report Status 10/26/2019 FINAL  Final  Blood Culture ID Panel (Reflexed)     Status: None   Collection Time: 10/21/19  6:13 AM  Result Value Ref Range Status   Enterococcus species NOT DETECTED NOT DETECTED Final   Listeria monocytogenes NOT DETECTED NOT DETECTED Final   Staphylococcus species NOT DETECTED NOT DETECTED Final   Staphylococcus aureus (BCID) NOT DETECTED NOT DETECTED Final   Streptococcus species NOT DETECTED NOT DETECTED Final   Streptococcus agalactiae NOT DETECTED NOT DETECTED Final   Streptococcus pneumoniae NOT DETECTED NOT DETECTED Final   Streptococcus pyogenes NOT DETECTED NOT DETECTED Final   Acinetobacter baumannii NOT DETECTED NOT DETECTED Final   Enterobacteriaceae species NOT DETECTED NOT DETECTED Final   Enterobacter cloacae  complex NOT DETECTED NOT DETECTED Final   Escherichia coli NOT DETECTED NOT DETECTED Final   Klebsiella oxytoca NOT DETECTED NOT DETECTED Final   Klebsiella pneumoniae NOT DETECTED NOT DETECTED Final   Proteus species NOT DETECTED NOT DETECTED Final   Serratia marcescens NOT DETECTED NOT DETECTED Final   Haemophilus influenzae NOT DETECTED NOT DETECTED Final   Neisseria meningitidis NOT DETECTED NOT DETECTED Final   Pseudomonas aeruginosa NOT DETECTED NOT DETECTED Final   Candida albicans NOT DETECTED NOT DETECTED Final   Candida glabrata NOT DETECTED NOT DETECTED Final   Candida krusei NOT DETECTED NOT DETECTED Final   Candida parapsilosis NOT DETECTED NOT DETECTED Final   Candida tropicalis NOT DETECTED NOT DETECTED Final    Comment: Performed at [facility], [address], [place], [place] [address]  SARS CORONAVIRUS 2 (TAT 6-24 HRS) Nasopharyngeal Nasopharyngeal Swab     Status: None   Collection Time: 10/21/19  7:30 AM   Specimen: Nasopharyngeal Swab  Result Value Ref Range Status   SARS Coronavirus 2 NEGATIVE NEGATIVE Final    Comment: (NOTE) SARS-CoV-2 target nucleic acids are NOT DETECTED. The SARS-CoV-2 RNA is generally detectable in upper and lower respiratory specimens during the acute phase of infection. Negative results do not preclude SARS-CoV-2 infection, do not rule out co-infections with other pathogens, and should not be used as the sole basis for treatment or other patient management decisions. Negative results must be combined with clinical  observations, patient history, and epidemiological information. The expected result is Negative. Fact Sheet for Patients: SugarRoll.be Fact Sheet for Healthcare Providers: https://www.woods-mathews.com/ This test is not yet approved or cleared by the [place] FDA and  has been authorized for detection and/or diagnosis of SARS-CoV-2 by FDA under an Emergency Use  Authorization (EUA). This EUA will remain  in effect (meaning this test can be used) for the duration of the COVID-19 declaration under Section 56 4(b)(1) of the Act, 21 U.S.C. section 360bbb-3(b)(1), unless the authorization is terminated or revoked sooner. Performed at [facility], [place] [address] [place], [place] [address]   Surgical PCR screen     Status: Abnormal   Collection Time: 10/26/19  7:19 AM   Specimen: Nasal Mucosa; Nasal Swab  Result Value Ref Range Status   MRSA, PCR NEGATIVE NEGATIVE Final   Staphylococcus aureus POSITIVE (A) NEGATIVE Final    Comment: (NOTE) The Xpert SA Assay (FDA approved for NASAL specimens in patients 17 years of age and older), is one component of a comprehensive surveillance program. It is not intended to diagnose infection nor to guide or monitor treatment. Performed at [facility], [place] [address], [place], [place] [address]     RADIOLOGY STUDIES/RESULTS: No results found.   LOS: 8 days   Phillips Climes, MD  [facility]    To contact the attending provider between 7A-7P or the covering provider during after hours 7P-7A, please log into the web site [email or web address] and access using universal [place] password for that web site. If you do not have the password, please call the hospital operator.  10/29/2019, 3:19 PM

## 2019-10-30 LAB — SARS CORONAVIRUS 2 (TAT 6-24 HRS): SARS Coronavirus 2: NEGATIVE

## 2019-10-30 LAB — CBC
HCT: 27.8 % — ABNORMAL LOW (ref 36.0–46.0)
Hemoglobin: 8.6 g/dL — ABNORMAL LOW (ref 12.0–15.0)
MCH: 31.2 pg (ref 26.0–34.0)
MCHC: 30.9 g/dL (ref 30.0–36.0)
MCV: 100.7 fL — ABNORMAL HIGH (ref 80.0–100.0)
Platelets: ADEQUATE 10*3/uL (ref 150–400)
RBC: 2.76 MIL/uL — ABNORMAL LOW (ref 3.87–5.11)
RDW: 18.6 % — ABNORMAL HIGH (ref 11.5–15.5)
WBC: 9 10*3/uL (ref 4.0–10.5)
nRBC: 0 % (ref 0.0–0.2)

## 2019-10-30 LAB — PROTIME-INR
INR: 3.2 — ABNORMAL HIGH (ref 0.8–1.2)
Prothrombin Time: 32.4 seconds — ABNORMAL HIGH (ref 11.4–15.2)

## 2019-10-30 LAB — BASIC METABOLIC PANEL
Anion gap: 7 (ref 5–15)
BUN: 23 mg/dL (ref 8–23)
CO2: 37 mmol/L — ABNORMAL HIGH (ref 22–32)
Calcium: 8.4 mg/dL — ABNORMAL LOW (ref 8.9–10.3)
Chloride: 100 mmol/L (ref 98–111)
Creatinine, Ser: 0.95 mg/dL (ref 0.44–1.00)
GFR calc Af Amer: 60 mL/min — ABNORMAL LOW (ref >60)
GFR calc non Af Amer: 52 mL/min — ABNORMAL LOW (ref >60)
Glucose, Bld: 112 mg/dL — ABNORMAL HIGH (ref 70–99)
Potassium: 3.6 mmol/L (ref 3.5–5.1)
Sodium: 144 mmol/L (ref 135–145)

## 2019-10-30 NOTE — Progress Notes (Signed)
Progress Note  Patient Name: [name] Date of Encounter: 10/30/2019  Primary Cardiologist:   Fransico Him, MD   Subjective   No complaints.  No SOB.  No pain.    Inpatient Medications    Scheduled Meds: . bacitracin   Topical Daily  . Chlorhexidine Gluconate Cloth  6 each Topical Q0600  . citalopram  10 mg Oral Daily  . feeding supplement (ENSURE ENLIVE)  237 mL Oral TID BM  . fluticasone furoate-vilanterol  1 puff Inhalation Daily  . furosemide  20 mg Oral Daily  . latanoprost  1 drop Both Eyes QHS  . metoprolol tartrate  25 mg Oral QID  . metroNIDAZOLE  500 mg Oral Q8H  . montelukast  10 mg Oral QHS  . mupirocin ointment  1 application Nasal BID  . pilocarpine  1 drop Both Eyes BID  . sodium chloride flush  3 mL Intravenous Q12H  . Warfarin - Pharmacist Dosing Inpatient   Does not apply q1600   Continuous Infusions: . cefTRIAXone (ROCEPHIN)  IV 2 g (10/29/19 1826)  . lactated ringers 10 mL/hr at 10/30/19 0500   PRN Meds: acetaminophen **OR** acetaminophen, albuterol, ALPRAZolam, diphenhydrAMINE, fluticasone, ondansetron **OR** ondansetron (ZOFRAN) IV, Resource ThickenUp Clear   Vital Signs    Vitals:   10/30/19 0400 10/30/19 0403 10/30/19 0754 10/30/19 0800  BP:    124/74  Pulse:    (!) 109  Resp:    18  Temp: 98.7 F (37.1 C)  98.2 F (36.8 C)   TempSrc: Oral  Oral   SpO2:      Weight:  54.5 kg    Height:        Intake/Output Summary (Last 24 hours) at 10/30/2019 1023 Last data filed at 10/30/2019 0538 Gross per 24 hour  Intake 290 ml  Output 500 ml  Net -210 ml   Filed Weights   10/28/19 0500 10/29/19 0500 10/30/19 0403  Weight: 55.2 kg 54.5 kg 54.5 kg    Telemetry    Atrial fib with controlled rate - Personally Reviewed  ECG    NA - Personally Reviewed  Physical Exam   GEN: No acute distress.   Neck: No  JVD Cardiac: Irreugular RR, no murmurs, rubs, or gallops.  Respiratory: Clear to auscultation bilaterally. GI: Soft,  nontender, non-distended  MS: No edema; No deformity. Neuro:  Nonfocal  Psych: Normal affect   Labs    Chemistry Recent Labs  Lab 10/28/19 0432 10/29/19 0544 10/30/19 0501  NA 142 144 144  K 3.9 3.6 3.6  CL 98 99 100  CO2 36* 36* 37*  GLUCOSE 98 99 112*  BUN 23 24* 23  CREATININE 1.04* 1.06* 0.95  CALCIUM 8.3* 8.4* 8.4*  GFRNONAA 46* 45* 52*  GFRAA 54* 52* 60*  ANIONGAP 8 9 7      Hematology Recent Labs  Lab 10/28/19 0432 10/29/19 0544 10/30/19 0501  WBC 7.8 8.9 9.0  RBC 2.69* 2.67* 2.76*  HGB 8.3* 8.4* 8.6*  HCT 26.4* 26.7* 27.8*  MCV 98.1 100.0 100.7*  MCH 30.9 31.5 31.2  MCHC 31.4 31.5 30.9  RDW 18.4* 18.5* 18.6*  PLT 146* 136* PLATELET CLUMPS NOTED ON SMEAR, COUNT APPEARS ADEQUATE    Cardiac EnzymesNo results for input(s): TROPONINI in the last 168 hours. No results for input(s): TROPIPOC in the last 168 hours.   BNPNo results for input(s): BNP, PROBNP in the last 168 hours.   DDimer No results for input(s): DDIMER in the last 168 hours.  Radiology    No results found.  Cardiac Studies    Echo 10/22/2019 1. Left ventricular ejection fraction, by estimation, is 50 to 55%. The  left ventricle has low normal function. The left ventricle has no regional  wall motion abnormalities. There is mild left ventricular hypertrophy.  Left ventricular diastolic  parameters are indeterminate.  2. Right ventricular systolic function is mildly reduced. The right  ventricular size is moderately enlarged. There is moderately elevated  pulmonary artery systolic pressure. The estimated right ventricular  systolic pressure is [id number] mmHg.  3. Left atrial size was severely dilated.  4. Right atrial size was severely dilated.  5. Moderate pleural effusion in the left lateral region.  6. The mitral valve is normal in structure. Trivial mitral valve  regurgitation.  7. The tricuspid valve is abnormal. Tricuspid valve regurgitation is  severe.  8. The aortic  valve is tricuspid. Aortic valve regurgitation is not  visualized. Mild to moderate aortic valve sclerosis/calcification is  present, without any evidence of aortic stenosis.  9. Aortic dilatation noted. There is dilatation of the ascending aorta  measuring 42 mm.  10. The inferior vena cava is dilated in size with <50% respiratory  variability, suggesting right atrial pressure of 15 mmHg.    Patient Profile     86 y.o. female with a hx of CAD s/p PCI to [place], permanent atrial fibrillation on Coumadin, HTN, HLD, COPD and AAA who was seen for the evaluation of preoperative cardiac risk in the setting of right calcaneus osteomyelitis at the request of Ghimire.  Assessment & Plan    CHRONIC ATRIAL FIB:  Rate is OK.  On warfarin.  Will stop telemetry.   CHRONIC DIASTOLIC HF:   Though likely incomplete, I/O net negative 2.8 liters this admission.  Continue current Lasix   ASCENDING THORACIC ANEURYSM:  Follow.   [facility] will sign off.   Medication Recommendations:  As on [facility] Other recommendations (labs, testing, etc):  NA Follow up as an outpatient:  Can see Dr. Lovena Le in six months.    For questions or updates, please contact Sharon Please consult [email or web address] for contact info under Cardiology/STEMI.   Signed, Minus Breeding, MD  10/30/2019, 10:23 AM

## 2019-10-30 NOTE — Progress Notes (Signed)
ANTICOAGULATION - Follow Up Consult  Pharmacy Consult for Warfarin Indication: atrial fibrillation  Allergies  Allergen Reactions  . Alphagan [Brimonidine] Other (See Comments)    Burning sensation  . Apraclonidine Other (See Comments)    Pain, brow pain, tender, not able to tolerate  . Biaxin [Clarithromycin] Other (See Comments)    Unknown, per pt   . Brovana [Arformoterol] Other (See Comments)    Makes my heart race  . Budesonide Other (See Comments)    Makes my heart race  . Cefdinir Other (See Comments)    "FEEL HORRIBLE"  . Cephalexin Itching    Pt tolerated Rocephin 08/2019  . Cifenline Itching  . Clonidine Other (See Comments)    unknown  . Clonidine Derivatives Other (See Comments)    REACTION: unknown  . Dorzolamide Other (See Comments)    Redness  . Iodinated Diagnostic Agents Other (See Comments)    unknown  . Iohexol Other (See Comments)     Desc: NOTES FROM PRIOR CT STATES PRE MEDICATION PRIOR TO OMNIPAQUE ENHANCED CT   . Nsaids Other (See Comments)    Unknown  . Penicillins Itching, Swelling and Other (See Comments)    Swelling and edema of ears and rash Swelling to ears  Has patient had a PCN reaction causing immediate rash, facial/tongue/throat swelling, SOB or lightheadedness with hypotension: {no Has patient had a PCN reaction causing severe rash involving mucus membranes or skin necrosis: {no Has patient had a PCN reaction that required hospitalization no Has patient had a PCN reaction occurring within the last 10 years: no If all of the above answers are "NO", then may proceed with Cephalosporin use.  . Sulfamethoxazole Other (See Comments)    Unknown  . Sulfonamide Derivatives Itching  . Tetracycline Itching and Other (See Comments)    unknown  . Timolol Other (See Comments)    Eye red and irritated   . Vancomycin Other (See Comments)    Turn red  . Zetia [Ezetimibe] Other (See Comments)    Unknown  . Cardizem [Diltiazem Hcl] Rash     Patient Measurements: Height: 5\' 5"  (165.1 cm) Weight: 54.5 kg (120 lb 2.4 oz) IBW/kg (Calculated) : 57  Vital Signs: Temp: 98.2 F (36.8 C) (04/26 0754) Temp Source: Oral (04/26 0754) BP: 124/74 (04/26 0800) Pulse Rate: 109 (04/26 0800)  Labs: Recent Labs    10/28/19 0432 10/28/19 0432 10/29/19 0544 10/30/19 0501  HGB 8.3*   < > 8.4* 8.6*  HCT 26.4*  --  26.7* 27.8*  PLT 146*  --  136* PLATELET CLUMPS NOTED ON SMEAR, COUNT APPEARS ADEQUATE  LABPROT 20.1*  --  23.7* 32.4*  INR 1.7*  --  2.1* 3.2*  CREATININE 1.04*  --  1.06* 0.95   < > = values in this interval not displayed.    Estimated Creatinine Clearance: 31.8 mL/min (by C-G formula based on SCr of 0.95 mg/dL).  Assessment:   51 YOF presenting with SOB, on warfarin for Afib.  INR on admission supratherapeutic at 9.3, received vitamin K 2.5 mg PO on 4/17. INR was slow to decline > only down to 5.3 on 4/21. Received Vitamin K 5 mg PO on 4/21 and 2.5 mg 4/21 pm > down to 2.1 on 4/22 and now s/p R BKA.  Warfarin resumed POD#1 on 4/23.    INR is now supratherapeutic (3.2) after warfarin 3 mg x 2 days then 2 mg yesterday.   Likely due to concurrent Flagyl, day # 6 of 14  planned.  Also on Ceftriaxone for Bacteroides in blood culture 4/22.     PTA warfarin regimen: 4 mg daily.   Goal of Therapy:  INR 2-3 Monitor platelets by anticoagulation protocol: Yes    Plan:   Hold warfarin today.  Sensitivity likely due to concurrent Flagyl.  Daily PT/INR.  Noted planning 14 days of Flagyl. Will also need to follow closely when Flagyl course ends.     Arty Baumgartner, [place] Phone: [phone] 10/30/2019,1:13 PM

## 2019-10-30 NOTE — Progress Notes (Signed)
PROGRESS NOTE        PATIENT DETAILS Name: [name] Age: 86 y.o. Sex: female Date of Birth: 06-18-1924 Admit Date: 10/21/2019 Admitting Physician Norval Morton, MD OO:[id number], Lenna Sciara, PA-C  Brief Narrative:  Patient is a 86 y.o. female with history of chronic atrial fibrillation-on Coumadin, chronic diastolic heart failure, CAD history of remote PCI, COPD/asthma overlap presented with shortness of breath-thought to be secondary to decompensated diastolic heart failure, A. fib with RVR-and also found to have osteomyelitis of the right calcaneus.  See below for further details.  Significant events: 4/17>> admit to [facility]-osteomyelitis of right calcaneus, A. fib with RVR, decompensated heart failure and coagulopathy 4/18>> TTE-EF [id number], RV systolic function mildly reduced-RVSP 55.2 4/22>> right BKA  Antimicrobial therapy: Flagyl 4/21>>4/22 Cefepime: 4/20>> Vancomycin 4/17>> 4/20 Rocephin 4/17>> 4/20, 4/22>>  Microbiology data: 4/7>> blood culture: Negative  Procedures : 4/22>> right BKA  Consults: Orthopedics  DVT Prophylaxis : On Warfarin  Subjective: Lying comfortably in bed.  No major issues overnight.  Does report some nausea this morning, but she reports good bowel movement.  Assessment/Plan:  SIRS secondary to right lower extremity soft tissue infection with osteomyelitis and resultant pathological fracture of the right calcaneus:  - SIRS physiology has resolved-after extensive discussion patient/family-underwent right BKA on 4/22  Bacteroides bacteremia:  - 1 out of 2 blood cultures positive for Bacteroides ovatus-discussed with ID MD-switch to Rocephin and Flagyl.  Keep on this regimen during hospital stay, and upon discharge she will be discharged on oral Flagyl for total of 14 days of Flagyl.  Chronic atrial fibrillation with RVR: -Management per cardiology, but overall reasonable rate control with metoprolol 25 mg  oral 4 times daily, will defer further management for cardiology, likely they would consolidate her metoprolol tomorrow . -On warfarin for anticoagulation.  Supratherapeutic INR:  -Secondary to Coumadin use-no evidence of bleeding-given several doses of vitamin K-as patient was undergoing surgery on 4/21.  She is back on warfarin  Acute hypoxic respiratory failure secondary to decompensated diastolic heart failure:  -Continue with low-dose Lasix per cardiology recommendation.  ..  AKI: Improved-likely hemodynamically mediated  Transient hypotension: Likely secondary to medication use-monitor for now.  BP now stable.  Pulmonary hypertension: Likely secondary to COPD.  Doubt further work-up/invasive therapy will change management or outcome in this frail elderly patient.   COPD/asthma overlap: Some scattered wheezing today-stable and not in exacerbation-continue bronchodilators-encourage incentive spirometry  Anemia: Suspect has chronic anemia due to osteomyelitis/chronic inflammation-worsened by acute illness.  No indication for transfusion-follow for now.  Anxiety: Appears stable-continue as needed Ativan  Dysphagia: Chronic issue-appreciate SLP evaluation  Hypokalemia:  repleted  4.5 cm ascending aortic aneurysm seen on CT chest: Stable for outpatient follow-up  Diet: Diet Order            DIET DYS 3 Room service appropriate? No; Fluid consistency: Nectar Thick  Diet effective now               Code Status:  DNR  Family Communication: Daughter over the phone on 4/23  Disposition Plan:  SNF when bed is available.   Antimicrobial agents: Anti-infectives (From admission, onward)   Start     Dose/Rate Route Frequency Ordered Stop   10/26/19 1800  cefTRIAXone (ROCEPHIN) 2 g in sodium chloride 0.9 % 100 mL IVPB     2 g 200 mL/hr  over 30 Minutes Intravenous Every 24 hours 10/26/19 1436     10/25/19 1800  ceFEPIme (MAXIPIME) 2 g in sodium chloride 0.9 % 100 mL IVPB   Status:  Discontinued     2 g 200 mL/hr over 30 Minutes Intravenous Every 12 hours 10/25/19 0854 10/26/19 1436   10/25/19 0845  metroNIDAZOLE (FLAGYL) tablet 500 mg     500 mg Oral Every 8 hours 10/25/19 0840     10/24/19 2200  vancomycin (VANCOCIN) IVPB 1000 mg/200 mL premix  Status:  Discontinued     1,000 mg 100 mL/hr over 120 Minutes Intravenous Every 36 hours 10/24/19 1013 10/25/19 0840   10/24/19 1645  ceFEPIme (MAXIPIME) 2 g in sodium chloride 0.9 % 100 mL IVPB  Status:  Discontinued     2 g 200 mL/hr over 30 Minutes Intravenous Every 8 hours 10/24/19 1644 10/25/19 0854   10/23/19 0800  vancomycin (VANCOCIN) IVPB 1000 mg/200 mL premix  Status:  Discontinued     1,000 mg 100 mL/hr over 120 Minutes Intravenous Every 48 hours 10/21/19 0652 10/24/19 1013   10/22/19 0800  cefTRIAXone (ROCEPHIN) 2 g in sodium chloride 0.9 % 100 mL IVPB  Status:  Discontinued     2 g 200 mL/hr over 30 Minutes Intravenous Every 24 hours 10/21/19 0647 10/24/19 1644   10/21/19 0700  vancomycin (VANCOREADY) IVPB 1250 mg/250 mL     1,250 mg 83.3 mL/hr over 180 Minutes Intravenous  Once 10/21/19 0630 10/21/19 1140   10/21/19 0700  cefTRIAXone (ROCEPHIN) 1 g in sodium chloride 0.9 % 100 mL IVPB     1 g 200 mL/hr over 30 Minutes Intravenous  Once 10/21/19 0647 10/21/19 1225   10/21/19 0515  cefTRIAXone (ROCEPHIN) 1 g in sodium chloride 0.9 % 100 mL IVPB     1 g 200 mL/hr over 30 Minutes Intravenous  Once 10/21/19 0501 10/21/19 0646       MEDICATIONS: Scheduled Meds: . bacitracin   Topical Daily  . Chlorhexidine Gluconate Cloth  6 each Topical Q0600  . citalopram  10 mg Oral Daily  . feeding supplement (ENSURE ENLIVE)  237 mL Oral TID BM  . fluticasone furoate-vilanterol  1 puff Inhalation Daily  . furosemide  20 mg Oral Daily  . latanoprost  1 drop Both Eyes QHS  . metoprolol tartrate  25 mg Oral QID  . metroNIDAZOLE  500 mg Oral Q8H  . montelukast  10 mg Oral QHS  . mupirocin ointment  1  application Nasal BID  . pilocarpine  1 drop Both Eyes BID  . sodium chloride flush  3 mL Intravenous Q12H  . Warfarin - Pharmacist Dosing Inpatient   Does not apply q1600   Continuous Infusions: . cefTRIAXone (ROCEPHIN)  IV 2 g (10/29/19 1826)  . lactated ringers 10 mL/hr at 10/30/19 0500   PRN Meds:.acetaminophen **OR** acetaminophen, albuterol, ALPRAZolam, diphenhydrAMINE, fluticasone, ondansetron **OR** ondansetron (ZOFRAN) IV, Resource ThickenUp Clear   PHYSICAL EXAM: Vital signs: Vitals:   10/30/19 0403 10/30/19 0754 10/30/19 0800 10/30/19 1334  BP:   124/74 110/74  Pulse:   (!) 109 97  Resp:   18   Temp:  98.2 F (36.8 C)    TempSrc:  Oral    SpO2:      Weight: 54.5 kg     Height:       Filed Weights   10/28/19 0500 10/29/19 0500 10/30/19 0403  Weight: 55.2 kg 54.5 kg 54.5 kg   Body mass index is 19.99  kg/m.   Awake Alert, Oriented X 3, No new F.N deficits, Normal affect Symmetrical Chest wall movement, Good air movement bilaterally, CTAB Irregular irregular,No Gallops,Rubs or new Murmurs, No Parasternal Heave +ve B.Sounds, Abd Soft, No tenderness, No rebound - guarding or rigidity. Right BKA  I have personally reviewed following labs and imaging studies  LABORATORY DATA: CBC: Recent Labs  Lab 10/26/19 0621 10/27/19 0619 10/28/19 0432 10/29/19 0544 10/30/19 0501  WBC 6.2 7.9 7.8 8.9 9.0  HGB 9.2* 8.8* 8.3* 8.4* 8.6*  HCT 27.8* 27.7* 26.4* 26.7* 27.8*  MCV 97.5 98.6 98.1 100.0 100.7*  PLT PLATELET CLUMPS NOTED ON SMEAR, COUNT APPEARS ADEQUATE 146* 146* 136* PLATELET CLUMPS NOTED ON SMEAR, COUNT APPEARS ADEQUATE    Basic Metabolic Panel: Recent Labs  Lab 10/26/19 0621 10/27/19 0619 10/28/19 0432 10/29/19 0544 10/30/19 0501  NA 142 140 142 144 144  K 3.5 3.2* 3.9 3.6 3.6  CL 97* 95* 98 99 100  CO2 35* 37* 36* 36* 37*  GLUCOSE 97 96 98 99 112*  BUN 15 20 23  24* 23  CREATININE 0.96 1.06* 1.04* 1.06* 0.95  CALCIUM 8.5* 8.2* 8.3* 8.4* 8.4*     GFR: Estimated Creatinine Clearance: 31.8 mL/min (by C-G formula based on SCr of 0.95 mg/dL).  Liver Function Tests: No results for input(s): AST, ALT, ALKPHOS, BILITOT, PROT, ALBUMIN in the last 168 hours. No results for input(s): LIPASE, AMYLASE in the last 168 hours. No results for input(s): AMMONIA in the last 168 hours.  Coagulation Profile: Recent Labs  Lab 10/26/19 0621 10/27/19 0619 10/28/19 0432 10/29/19 0544 10/30/19 0501  INR 2.1* 1.7* 1.7* 2.1* 3.2*    Cardiac Enzymes: No results for input(s): CKTOTAL, CKMB, CKMBINDEX, TROPONINI in the last 168 hours.  BNP (last 3 results) No results for input(s): PROBNP in the last 8760 hours.  Lipid Profile: No results for input(s): CHOL, HDL, LDLCALC, TRIG, CHOLHDL, LDLDIRECT in the last 72 hours.  Thyroid Function Tests: No results for input(s): TSH, T4TOTAL, FREET4, T3FREE, THYROIDAB in the last 72 hours.  Anemia Panel: No results for input(s): VITAMINB12, FOLATE, FERRITIN, TIBC, IRON, RETICCTPCT in the last 72 hours.  Urine analysis:    Component Value Date/Time   COLORURINE YELLOW 04/05/2018 1344   APPEARANCEUR CLEAR 04/05/2018 1344   LABSPEC 1.009 04/05/2018 1344   PHURINE 6.0 04/05/2018 1344   GLUCOSEU NEGATIVE 04/05/2018 1344   HGBUR NEGATIVE 04/05/2018 1344   BILIRUBINUR NEGATIVE 04/05/2018 1344   KETONESUR NEGATIVE 04/05/2018 1344   PROTEINUR NEGATIVE 04/05/2018 1344   UROBILINOGEN 0.2 03/05/2015 1600   NITRITE NEGATIVE 04/05/2018 1344   LEUKOCYTESUR NEGATIVE 04/05/2018 1344    Sepsis Labs: Lactic Acid, Venous    Component Value Date/Time   LATICACIDVEN 0.9 10/21/2019 2111    MICROBIOLOGY: Recent Results (from the past 240 hour(s))  Blood culture (routine x 2)     Status: Abnormal   Collection Time: 10/21/19  6:13 AM   Specimen: BLOOD  Result Value Ref Range Status   Specimen Description BLOOD RIGHT ANTECUBITAL  Final   Special Requests   Final    BOTTLES DRAWN AEROBIC AND ANAEROBIC Blood  Culture results may not be optimal due to an excessive volume of blood received in culture bottles   Culture  Setup Time   Final    GRAM NEGATIVE RODS ANAEROBIC BOTTLE ONLY CRITICAL RESULT CALLED TO, READ BACK BY AND VERIFIED WITH: K. NAIK PHARMD, AT WE:[id number] 10/25/19 BY D. VANHOOK    Culture (A)  Final  BACTEROIDES OVATUS BETA LACTAMASE POSITIVE Performed at [facility], [place] [address], [place], [place] [address]    Report Status 10/26/2019 FINAL  Final  Blood culture (routine x 2)     Status: None   Collection Time: 10/21/19  6:13 AM   Specimen: BLOOD RIGHT WRIST  Result Value Ref Range Status   Specimen Description BLOOD RIGHT WRIST  Final   Special Requests   Final    BOTTLES DRAWN AEROBIC AND ANAEROBIC Blood Culture adequate volume   Culture   Final    NO GROWTH 5 DAYS Performed at [facility], [place] [address], [place], [place] [address]    Report Status 10/26/2019 FINAL  Final  Blood Culture ID Panel (Reflexed)     Status: None   Collection Time: 10/21/19  6:13 AM  Result Value Ref Range Status   Enterococcus species NOT DETECTED NOT DETECTED Final   Listeria monocytogenes NOT DETECTED NOT DETECTED Final   Staphylococcus species NOT DETECTED NOT DETECTED Final   Staphylococcus aureus (BCID) NOT DETECTED NOT DETECTED Final   Streptococcus species NOT DETECTED NOT DETECTED Final   Streptococcus agalactiae NOT DETECTED NOT DETECTED Final   Streptococcus pneumoniae NOT DETECTED NOT DETECTED Final   Streptococcus pyogenes NOT DETECTED NOT DETECTED Final   Acinetobacter baumannii NOT DETECTED NOT DETECTED Final   Enterobacteriaceae species NOT DETECTED NOT DETECTED Final   Enterobacter cloacae complex NOT DETECTED NOT DETECTED Final   Escherichia coli NOT DETECTED NOT DETECTED Final   Klebsiella oxytoca NOT DETECTED NOT DETECTED Final   Klebsiella pneumoniae NOT DETECTED NOT DETECTED Final   Proteus species NOT DETECTED NOT DETECTED Final   Serratia marcescens NOT  DETECTED NOT DETECTED Final   Haemophilus influenzae NOT DETECTED NOT DETECTED Final   Neisseria meningitidis NOT DETECTED NOT DETECTED Final   Pseudomonas aeruginosa NOT DETECTED NOT DETECTED Final   Candida albicans NOT DETECTED NOT DETECTED Final   Candida glabrata NOT DETECTED NOT DETECTED Final   Candida krusei NOT DETECTED NOT DETECTED Final   Candida parapsilosis NOT DETECTED NOT DETECTED Final   Candida tropicalis NOT DETECTED NOT DETECTED Final    Comment: Performed at [facility], [address] [place], [place] [address]  SARS CORONAVIRUS 2 (TAT 6-24 HRS) Nasopharyngeal Nasopharyngeal Swab     Status: None   Collection Time: 10/21/19  7:30 AM   Specimen: Nasopharyngeal Swab  Result Value Ref Range Status   SARS Coronavirus 2 NEGATIVE NEGATIVE Final    Comment: (NOTE) SARS-CoV-2 target nucleic acids are NOT DETECTED. The SARS-CoV-2 RNA is generally detectable in upper and lower respiratory specimens during the acute phase of infection. Negative results do not preclude SARS-CoV-2 infection, do not rule out co-infections with other pathogens, and should not be used as the sole basis for treatment or other patient management decisions. Negative results must be combined with clinical observations, patient history, and epidemiological information. The expected result is Negative. Fact Sheet for Patients: SugarRoll.be Fact Sheet for Healthcare Providers: https://www.woods-mathews.com/ This test is not yet approved or cleared by the [place] FDA and  has been authorized for detection and/or diagnosis of SARS-CoV-2 by FDA under an Emergency Use Authorization (EUA). This EUA will remain  in effect (meaning this test can be used) for the duration of the COVID-19 declaration under Section 56 4(b)(1) of the Act, 21 U.S.C. section 360bbb-3(b)(1), unless the authorization is terminated or revoked sooner. Performed at [facility], [place],  [place] [address]   Surgical PCR screen     Status: Abnormal   Collection Time: 10/26/19  7:19 AM   Specimen: Nasal Mucosa; Nasal Swab  Result Value Ref Range Status   MRSA, PCR NEGATIVE NEGATIVE Final   Staphylococcus aureus POSITIVE (A) NEGATIVE Final    Comment: (NOTE) The Xpert SA Assay (FDA approved for NASAL specimens in patients 31 years of age and older), is one component of a comprehensive surveillance program. It is not intended to diagnose infection nor to guide or monitor treatment. Performed at [facility], [place] [address] [place], [place] [address]   SARS CORONAVIRUS 2 (TAT 6-24 HRS) Nasopharyngeal Nasopharyngeal Swab     Status: None   Collection Time: 10/30/19 10:41 AM   Specimen: Nasopharyngeal Swab  Result Value Ref Range Status   SARS Coronavirus 2 NEGATIVE NEGATIVE Final    Comment: (NOTE) SARS-CoV-2 target nucleic acids are NOT DETECTED. The SARS-CoV-2 RNA is generally detectable in upper and lower respiratory specimens during the acute phase of infection. Negative results do not preclude SARS-CoV-2 infection, do not rule out co-infections with other pathogens, and should not be used as the sole basis for treatment or other patient management decisions. Negative results must be combined with clinical observations, patient history, and epidemiological information. The expected result is Negative. Fact Sheet for Patients: SugarRoll.be Fact Sheet for Healthcare Providers: https://www.woods-mathews.com/ This test is not yet approved or cleared by the [place] FDA and  has been authorized for detection and/or diagnosis of SARS-CoV-2 by FDA under an Emergency Use Authorization (EUA). This EUA will remain  in effect (meaning this test can be used) for the duration of the COVID-19 declaration under Section 56 4(b)(1) of the Act, 21 U.S.C. section 360bbb-3(b)(1), unless the  authorization is terminated or revoked sooner. Performed at [facility], [place] [address], [place], [place] [address]     RADIOLOGY STUDIES/RESULTS: No results found.   LOS: 9 days   Phillips Climes, MD  [facility]    To contact the attending provider between 7A-7P or the covering provider during after hours 7P-7A, please log into the web site [email or web address] and access using universal [place] password for that web site. If you do not have the password, please call the hospital operator.  10/30/2019, 4:31 PM

## 2019-10-30 NOTE — Progress Notes (Signed)
Physical Therapy Treatment Patient Details Name: [name] MRN: [id number] DOB: October 21, 1923 Today's Date: 10/30/2019    History of Present Illness Patient is a 86 y.o. female with history of chronic atrial fibrillation-on Coumadin, chronic diastolic heart failure, CAD history of remote PCI, COPD/asthma overlap presented with shortness of breath-thought to be secondary to decompensated diastolic heart failure, A. fib with RVR-and also found to have osteomyelitis of the right calcaneus and is now s/p R BKA.    PT Comments    Pt was able to participate in standing and pivoting with mod-max A of 2.  Pt was incontinent of stool that somewhat limited treatment and pt returned to supine for cleaning.  Pt with excellent ROM of R residual limb and ability to participate with exercises.     Follow Up Recommendations  SNF     Equipment Recommendations  Other (comment);Wheelchair cushion (measurements PT);Wheelchair (measurements PT);3in1 (PT)    Recommendations for Other Services       Precautions / Restrictions Precautions Precautions: Fall Precaution Comments: R BKA Restrictions RLE Weight Bearing: Non weight bearing    Mobility  Bed Mobility Overal bed mobility: Needs Assistance Bed Mobility: Supine to Sit     Supine to sit: Mod assist;+2 for physical assistance     General bed mobility comments: verbal cues for sequencing and technique; mod A for trunk lift and scooting  Transfers Overall transfer level: Needs assistance Equipment used: 2 person hand held assist;Rolling walker (2 wheeled) Transfers: Sit to/from [facility] Sit to Stand: Max assist;+2 safety/equipment;+2 physical assistance Stand pivot transfers: Max assist;+2 safety/equipment;+2 physical assistance       General transfer comment: Performed sit to stand x 1 from bed and x 2 from recliner.  Pt pivoted to recliner and had BM mid-transfer.  Attempted to stand and perform ADLs with  assist in standing pt BM too extensive and pt unable to maintain standing.  Pt was pivoted back to bed for further ADLs.  For transfers provided verbal cues for leaning forward and L foot placement.  Ambulation/Gait                 Stairs             Wheelchair Mobility    Modified Rankin (Stroke Patients Only)       Balance Overall balance assessment: Needs assistance Sitting-balance support: Bilateral upper extremity supported;Feet supported Sitting balance-Leahy Scale: Fair Sitting balance - Comments: working on upright posture and balance EOB as pt with posterior pelvic tilt and forward trunk     Standing balance-Leahy Scale: Poor Standing balance comment: reliant on external assist- attempted standing with HHA of 2 and with RW, pt unable to maintain with L leg buckling                            Cognition Arousal/Alertness: Awake/alert Behavior During Therapy: Flat affect Overall Cognitive Status: Within Functional Limits for tasks assessed                                        Exercises Amputee Exercises Quad Sets: AROM;Both;10 reps;Supine Hip ABduction/ADduction: AROM;Both;10 reps;Supine Knee Flexion: AROM;10 reps;Right;Seated Knee Extension: AROM;Right;10 reps;Seated Straight Leg Raises: AROM;Right;10 reps;Supine    General Comments General comments (skin integrity, edema, etc.): VSS.  Pt had BM with stool on bed, legs, floor, chair, and her  ACE wrap.  Returned to supine.  Nurse techs into assist with cleaning.  Notified nurse and removed ACE wrap as it had stool on it, RN reports will order another.      Pertinent Vitals/Pain Pain Assessment: No/denies pain    Home Living                      Prior Function            PT Goals (current goals can now be found in the care plan section) Acute Rehab PT Goals Patient Stated Goal: be able to go home  PT Goal Formulation: With patient Time For Goal  Achievement: 11/11/19 Potential to Achieve Goals: Fair Progress towards PT goals: Progressing toward goals    Frequency    Min 2X/week      PT Plan Current plan remains appropriate    Co-evaluation              AM-PAC PT "6 Clicks" Mobility   Outcome Measure  Help needed turning from your back to your side while in a flat bed without using bedrails?: Total Help needed moving from lying on your back to sitting on the side of a flat bed without using bedrails?: Total Help needed moving to and from a bed to a chair (including a wheelchair)?: Total Help needed standing up from a chair using your arms (e.g., wheelchair or bedside chair)?: Total Help needed to walk in hospital room?: Total Help needed climbing 3-5 steps with a railing? : Total 6 Click Score: 6    End of Session Equipment Utilized During Treatment: Gait belt Activity Tolerance: Other (comment)(Limited due to BM and returned to supine for cleanig) Patient left: with call bell/phone within reach;in bed;with bed alarm set Nurse Communication: Mobility status PT Visit Diagnosis: Other abnormalities of gait and mobility (R26.89);Muscle weakness (generalized) (M62.81);Difficulty in walking, not elsewhere classified (R26.2);Pain     Time: 1520-1550 PT Time Calculation (min) (ACUTE ONLY): 30 min  Charges:  $Therapeutic Exercise: 8-22 mins $Therapeutic Activity: 8-22 mins                     Maggie Font, PT Acute Rehab Services Pager [phone] [facility] [phone] [facility] [phone]    Karlton Lemon 10/30/2019, 5:02 PM

## 2019-10-30 NOTE — TOC Progression Note (Signed)
Transition of Care [facility]) - Progression Note    Patient Details  Name: [name] MRN: [id number] Date of Birth: 1924/03/15  Transition of [facility] CM/SW [place], LCSW Phone Number: 10/30/2019, 11:04 AM  Clinical Narrative:    [facility] still awaiting insurance approval. COVID test ordered.    Expected Discharge Plan: Skilled Nursing Facility Barriers to Discharge: Insurance Authorization  Expected Discharge Plan and Services Expected Discharge Plan: [place] In-house Referral: Clinical Social Work   Post Acute Care Choice: [place] Living arrangements for the past 2 months: Single Family Home                                       Social Determinants of Health (SDOH) Interventions    Readmission Risk Interventions Readmission Risk Prevention Plan 10/26/2019  Transportation Screening Complete  PCP or Specialist Appt within 3-5 Days Complete  HRI or [place] Complete  Social Work Consult for [place] Planning/Counseling Complete  Palliative Care Screening Not Applicable  Medication Review [employer] photographer) Complete  Some recent data might be hidden

## 2019-10-31 DIAGNOSIS — M86171 Other acute osteomyelitis, right ankle and foot: Secondary | ICD-10-CM | POA: Diagnosis not present

## 2019-10-31 DIAGNOSIS — R791 Abnormal coagulation profile: Secondary | ICD-10-CM | POA: Diagnosis not present

## 2019-10-31 DIAGNOSIS — I4891 Unspecified atrial fibrillation: Secondary | ICD-10-CM | POA: Diagnosis not present

## 2019-10-31 LAB — PROTIME-INR
INR: 3.3 — ABNORMAL HIGH (ref 0.8–1.2)
Prothrombin Time: 33.5 seconds — ABNORMAL HIGH (ref 11.4–15.2)

## 2019-10-31 LAB — SURGICAL PATHOLOGY

## 2019-10-31 MED ORDER — ENSURE ENLIVE PO LIQD: 237.0000 mL | Freq: Three times a day (TID) | ORAL | 12 refills | Status: AC

## 2019-10-31 MED ORDER — METOPROLOL TARTRATE 25 MG PO TABS: 25.0000 mg | ORAL_TABLET | Freq: Four times a day (QID) | ORAL | Status: DC

## 2019-10-31 MED ORDER — FUROSEMIDE 20 MG PO TABS: 20.0000 mg | ORAL_TABLET | Freq: Every day | ORAL | Status: DC

## 2019-10-31 MED ORDER — WARFARIN SODIUM 2 MG PO TABS: 2.0000 mg | ORAL_TABLET | Freq: Every day | ORAL | 11 refills | Status: DC

## 2019-10-31 MED ORDER — ACETAMINOPHEN 325 MG PO TABS: 650.0000 mg | ORAL_TABLET | Freq: Four times a day (QID) | ORAL | Status: AC | PRN

## 2019-10-31 MED ORDER — MIRTAZAPINE 15 MG PO TBDP: 15.0000 mg | ORAL_TABLET | Freq: Every day | ORAL | 2 refills | Status: AC

## 2019-10-31 MED ORDER — ALPRAZOLAM 0.5 MG PO TABS: 0.5000 mg | ORAL_TABLET | Freq: Two times a day (BID) | ORAL | 0 refills | Status: AC | PRN

## 2019-10-31 MED ORDER — METRONIDAZOLE 500 MG PO TABS: 500.0000 mg | ORAL_TABLET | Freq: Three times a day (TID) | ORAL | 0 refills | Status: AC

## 2019-10-31 NOTE — Discharge Summary (Signed)
[name], is a 86 y.o. female  DOB June 12, 1924  MRN [id number].  Admission date:  10/21/2019  Admitting Physician  Norval Morton, MD  Discharge Date:  10/31/2019   Primary MD  Julian Hy, PA-C  Recommendations for primary care physician for things to follow:  -Patient INR 3.3 on discharge, she is to resume warfarin on 4/29, please monitor INR on 30th, and adjust warfarin as needed. -Encourage patient to take oral intake, encourage her to drink Ensure as well, she was started on Remeron for appetite stimulant. -Follow with [facility] orthopedic doctor [place] in 10 days from discharge regarding wound check and suture removal, meanwhile keep dressing in place. -Patient is on dysphagia 3 with nectar thick, will need continued SLP follow-up at facility   Admission Diagnosis  Atrial fibrillation with rapid ventricular response ([place]) [I48.91] Acute osteomyelitis of right foot ([place]) [M86.171] Cellulitis of right lower extremity [L03.115] Supratherapeutic INR [R79.1] Hypervolemia, unspecified hypervolemia type [E87.70] Osteomyelitis of right foot, unspecified type [facility]) [M86.9]   Discharge Diagnosis  Atrial fibrillation with rapid ventricular response ([place]) [I48.91] Acute osteomyelitis of right foot ([place]) [M86.171] Cellulitis of right lower extremity [L03.115] Supratherapeutic INR [R79.1] Hypervolemia, unspecified hypervolemia type [E87.70] Osteomyelitis of right foot, unspecified type ([place]) [M86.9]    Principal Problem:   Acute osteomyelitis of right foot ([place]) Active Problems:   Anxiety   Atrial fibrillation with RVR (HCC)   Macrocytic anemia   AKI (acute kidney injury) ([place])   SIRS (systemic inflammatory response syndrome) ([place])   Transient hypotension   Dysphagia   AAA (abdominal aortic aneurysm) ([place])      Past Medical History:  Diagnosis Date  . Anxiety   . Arthritis   .  CAD (coronary artery disease) 2007   a. 1997 s/p PCI of diagonal.  . Constipation   . COPD/Asthma   . Diverticulosis   . Esophageal reflux   . Family history of adverse reaction to anesthesia    daughter had trouble breathing after surgery, had to be reintubated  . Glaucoma   . HTN (hypertension), benign   . Hyperlipidemia   . Malignant melanoma ([place])    a. 09/2012 s/p resection.  . Neuropathy    lower legs  . PAF (paroxysmal atrial fibrillation) (HCC)    a. intolerant to beta blockers due to asthma and intolerant to CCB due to rash;  b. CHA2DS2VASc = 6-->chronic coumadin;  c. 03/2014 Echo: EF 60-65%, Gr 1 DD, triv AI, mildly dil RV, PASP 83mmhg.  [name] TIA (transient ischemic attack)    a. 02/2011 - chronic coumadin in setting of PAF.  [name] Urinary, incontinence, stress female     Past Surgical History:  Procedure Laterality Date  . ABDOMINAL HYSTERECTOMY    . AMPUTATION Right 10/26/2019   Procedure: AMPUTATION BELOW KNEE;  Surgeon: Erle Crocker, MD;  Location: [place];  Service: Orthopedics;  Laterality: Right;  . APPENDECTOMY    . BACK SURGERY     lumbar laminectomy  . CHOLECYSTECTOMY    .  COLONOSCOPY    . CORONARY ANGIOPLASTY WITH STENT PLACEMENT    . EYE SURGERY    . NASAL SINUS SURGERY    . VESICOVAGINAL FISTULA CLOSURE W/ TAH    . WEIL OSTEOTOMY Left 06/12/2016   Procedure: Left [place] and [place] Left [name], Weil Osteotomy 2nd and 3rd Metatarsal;  Surgeon: Newt Minion, MD;  Location: [place];  Service: Orthopedics;  Laterality: Left;       History of present illness and  Hospital Course:     Kindly see H&P for history of present illness and admission details, please review complete Labs, Consult reports and Test reports for all details in brief  HPI  from the history and physical done on the day of admission 10/21/2019   HPI: CHAREESE Dennis is a 86 y.o. female with medical history significant of HTN, HLD, persistent atrial fibrillation on Coumadin, CAD  s/p stent, COPD/asthma, and anxiety presents with complaints of shortness of breath which started around 1:30 AM this morning.  Patient is resting and somewhat lethargic so much of history is obtained from her daughter present at bedside.  At baseline the patient's medications are arranged in a pillbox for her to take daily, but patient reportedly picks and chooses what she wants to take each day.  Family had plan to take the patient to be evaluated for worsening wound on her right heel.  Apparently she has been ordered an orthotic device to wear on her right foot, but it made her shoes and slippers too tight.  Patient had previously been being followed by wound care up until about a month ago.  EMS was called due to new onset of shortness of breath.  Shortness of breath symptoms were not improved after receiving nebulized breathing treatment.  EMS noted O2 saturations 89% on room air and she was placed on a nonrebreather.   ED Course: Upon admission into the emergency department patient was seen to be afebrile, pulse 10 1-1 42 in atrial fibrillation with RVR, respirations 22-31, blood pressures as low as 88/56-123/70, and O2 saturations 89% to 98% after being placed on 1.5 L of nasal cannula oxygen.  Labs significant for WBC 8.4, hemoglobin 8.3, potassium 3.6, platelets 127, BUN 26, creatinine 1.24, BNP 275.2, and INR 9.3.  X-rays of the right foot revealed signs concerning for osteomyelitis.  Patient has been given vancomycin, Rocephin, 0.5 mg of Ativan, and metoprolol 5 mg IV.  In the ED patient was also noted to have some issues with swallowing pills.   Hospital Course   Patient is a 86 y.o. female with history of chronic atrial fibrillation-on Coumadin, chronic diastolic heart failure, CAD history of remote PCI, COPD/asthma overlap presented with shortness of breath-thought to be secondary to decompensated diastolic heart failure, A. fib with RVR-and also found to have osteomyelitis of the right  calcaneus.  See below for further details.  Significant events: 4/17>> admit to [facility]-osteomyelitis of right calcaneus, A. fib with RVR, decompensated heart failure and coagulopathy 4/18>> TTE-EF [id number], RV systolic function mildly reduced-RVSP 55.2 4/22>> right BKA  Antimicrobial therapy: Flagyl 4/21>> KEEP UNTIL 11/07/2019 Cefepime: 4/20>> Vancomycin 4/17>> 4/20 Rocephin 4/17>> 4/20, 4/22>>5/27  SIRS secondary to right lower extremity soft tissue infection with osteomyelitis and resultant pathological fracture of the right calcaneus:  - SIRS physiology has resolved-after extensive discussion patient/family-underwent right BKA on 4/22  Bacteroides bacteremia:  - 1 out of 2 blood cultures positive for Bacteroides ovatus-discussed with ID MD-switch to Rocephin and Flagyl.  Keep on this regimen during hospital stay, and upon discharge she will be discharged on oral Flagyl for total of 14 days of Flagyl with stop date of 11/07/2019.  Chronic atrial fibrillation with RVR: -Heart rate controlled on  metoprolol 25 mg oral 4 times daily. -On warfarin for anticoagulation, her INR is 3.3 today, to resume warfarin at 2 mg on Thursday, and to monitor INR closely .  Supratherapeutic INR:  -Secondary to Coumadin use-no evidence of bleeding-given several doses of vitamin K-as patient was undergoing surgery on 4/21.  She is back on warfarin  Acute hypoxic respiratory failure secondary to decompensated diastolic heart failure:  -Continue with low-dose Lasix per cardiology recommendation.  ..  AKI: Improved-likely hemodynamically mediated  Transient hypotension: Likely secondary to medication use-monitor for now.  BP now stable.  Pulmonary hypertension: Likely secondary to COPD.  Doubt further work-up/invasive therapy will change management or outcome in this frail elderly patient.   COPD/asthma overlap: Some scattered wheezing today-stable and not in exacerbation-continue  bronchodilators-encourage incentive spirometry  Anemia: Suspect has chronic anemia due to osteomyelitis/chronic inflammation-worsened by acute illness.  No indication for transfusion-follow for now.  Anxiety: Appears stable-continue as needed Ativan  Dysphagia: Chronic issue-appreciate SLP evaluation, overall appetite has been poor, but has been mildly improving, but she was able to sustain taking 3 Ensure on a daily basis, as well she was started on Remeron to improve her appetite.  Hypokalemia:  repleted  4.5 cm ascending aortic aneurysm seen on CT chest: Stable for outpatient follow-up  Discharge Condition:  Stable Discussed with daughter via phone  Follow UP   Contact information for follow-up providers    Erle Crocker, MD Follow up in 10 day(s).   Specialty: Orthopedic Surgery Contact information: [place] [address] [phone]            Contact information for after-discharge care    Destination    [facility] Follow up.   Service: Skilled Nursing Contact information: [place] [phone]                    Discharge Instructions  and  Discharge Medications     Discharge Instructions    Discharge instructions   Complete by: As directed    Follow with Primary MD Julian Hy, PA-C after Discharge from SNF.  Get CBC, CMP,checked  by Primary MD next visit.    Disposition SNF   Diet: Regular diet , with feeding assistance and aspiration precautions.   On your next visit with your primary care physician please Get Medicines reviewed and adjusted.   Please request your Prim.MD to go over all Hospital Tests and Procedure/Radiological results at the follow up, please get all Hospital records sent to your Prim MD by signing hospital release before you go home.   If you experience worsening of your admission symptoms, develop shortness of breath,  life threatening emergency, suicidal or homicidal thoughts you must seek medical attention immediately by calling 911 or calling your MD immediately  if symptoms less severe.  You Must read complete instructions/literature along with all the possible adverse reactions/side effects for all the Medicines you take and that have been prescribed to you. Take any new Medicines after you have completely understood and accpet all the possible adverse reactions/side effects.   Do not drive, operating heavy machinery, perform activities at heights, swimming or participation in water activities or provide baby sitting services if your were  admitted for syncope or siezures until you have seen by Primary MD or a Neurologist and advised to do so again.  Do not drive when taking Pain medications.    Do not take more than prescribed Pain, Sleep and Anxiety Medications  Special Instructions: If you have smoked or chewed Tobacco  in the last 2 yrs please stop smoking, stop any regular Alcohol  and or any Recreational drug use.  Wear Seat belts while driving.   Please note  You were cared for by a hospitalist during your hospital stay. If you have any questions about your discharge medications or the care you received while you were in the hospital after you are discharged, you can call the unit and asked to speak with the hospitalist on call if the hospitalist that took care of you is not available. Once you are discharged, your primary care physician will handle any further medical issues. Please note that NO REFILLS for any discharge medications will be authorized once you are discharged, as it is imperative that you return to your primary care physician (or establish a relationship with a primary care physician if you do not have one) for your aftercare needs so that they can reassess your need for medications and monitor your lab values.   Discharge wound care:   Complete by: As directed    Keep dressing in  place Patient will follow up in 10 DAYS for wound check and suture removal if appropriate Call the office with concerns Radene Journey, MD     Allergies as of 10/31/2019      Reactions   Alphagan [brimonidine] Other (See Comments)   Burning sensation   Apraclonidine Other (See Comments)   Pain, brow pain, tender, not able to tolerate   Biaxin [clarithromycin] Other (See Comments)   Unknown, per pt   Brovana [arformoterol] Other (See Comments)   Makes my heart race   Budesonide Other (See Comments)   Makes my heart race   Cefdinir Other (See Comments)   "FEEL HORRIBLE"   Cephalexin Itching   Pt tolerated Rocephin 08/2019   Cifenline Itching   Clonidine Other (See Comments)   unknown   Clonidine Derivatives Other (See Comments)   REACTION: unknown   Dorzolamide Other (See Comments)   Redness   Iodinated Diagnostic Agents Other (See Comments)   unknown   Iohexol Other (See Comments)    Desc: NOTES FROM PRIOR CT STATES PRE MEDICATION PRIOR TO OMNIPAQUE ENHANCED CT   Nsaids Other (See Comments)   Unknown   Penicillins Itching, Swelling, Other (See Comments)   Swelling and edema of ears and rash Swelling to ears Has patient had a PCN reaction causing immediate rash, facial/tongue/throat swelling, SOB or lightheadedness with hypotension: {no Has patient had a PCN reaction causing severe rash involving mucus membranes or skin necrosis: {no Has patient had a PCN reaction that required hospitalization no Has patient had a PCN reaction occurring within the last 10 years: no If all of the above answers are "NO", then may proceed with Cephalosporin use.   Sulfamethoxazole Other (See Comments)   Unknown   Sulfonamide Derivatives Itching   Tetracycline Itching, Other (See Comments)   unknown   Timolol Other (See Comments)   Eye red and irritated   Vancomycin Other (See Comments)   Turn red   Zetia [ezetimibe] Other (See Comments)   Unknown   Cardizem [diltiazem Hcl] Rash        Medication List    STOP taking  these medications   acetaminophen 650 MG CR tablet Commonly known as: TYLENOL Replaced by: acetaminophen 325 MG tablet   amLODipine 10 MG tablet Commonly known as: NORVASC   cephALEXin 500 MG capsule Commonly known as: KEFLEX   lisinopril 40 MG tablet Commonly known as: ZESTRIL     TAKE these medications   acetaminophen 325 MG tablet Commonly known as: TYLENOL Take 2 tablets (650 mg total) by mouth every 6 (six) hours as needed for mild pain (or Fever >/= 101). Replaces: acetaminophen 650 MG CR tablet   albuterol 108 (90 Base) MCG/ACT inhaler Commonly known as: ProAir HFA Inhale 1-2 puffs into the lungs every 4 (four) hours as needed for wheezing or shortness of breath.   albuterol (2.5 MG/3ML) 0.083% nebulizer solution Commonly known as: PROVENTIL Take 3 mLs (2.5 mg total) by nebulization every 6 (six) hours as needed for wheezing or shortness of breath.   ALPRAZolam 0.5 MG tablet Commonly known as: XANAX Take 1 tablet (0.5 mg total) by mouth 2 (two) times daily as needed for anxiety.   cetirizine 10 MG tablet Commonly known as: ZYRTEC Take 10 mg by mouth daily as needed for allergies.   citalopram 10 MG tablet Commonly known as: CELEXA Take 10 mg by mouth daily.   clotrimazole-betamethasone cream Commonly known as: LOTRISONE Apply 1 application topically 2 (two) times daily as needed for skin breakdown.   feeding supplement (ENSURE ENLIVE) Liqd Take 237 mLs by mouth 3 (three) times daily between meals.   fluticasone 50 MCG/ACT nasal spray Commonly known as: FLONASE USE 1 SPRAY IN EACH NOSTRIL ONCE A DAY AS NEEDED FOR ALLERGIES OR RHINITIS What changed: See the new instructions.   fluticasone furoate-vilanterol 200-25 MCG/INH Aepb Commonly known as: Breo Ellipta Inhale 1 puff into the lungs daily.   furosemide 20 MG tablet Commonly known as: LASIX Take 1 tablet (20 mg total) by mouth daily. Start taking on: November 01, 2019 What changed:   medication strength  how much to take  when to take this  reasons to take this   latanoprost 0.005 % ophthalmic solution Commonly known as: XALATAN Place 1 drop into both eyes at bedtime.   loperamide 2 MG tablet Commonly known as: IMODIUM A-D Take 2 mg by mouth daily as needed for diarrhea or loose stools.   metoprolol tartrate 25 MG tablet Commonly known as: LOPRESSOR Take 1 tablet (25 mg total) by mouth 4 (four) times daily. What changed:   how much to take  when to take this   metroNIDAZOLE 500 MG tablet Commonly known as: FLAGYL Take 1 tablet (500 mg total) by mouth every 8 (eight) hours for 7 days.   mirtazapine 15 MG disintegrating tablet Commonly known as: REMERON SOL-TAB Take 1 tablet (15 mg total) by mouth at bedtime.   montelukast 10 MG tablet Commonly known as: SINGULAIR TAKE 1 TABLET BY MOUTH EVERYDAY AT BEDTIME What changed: See the new instructions.   MULTIVITAMIN & MINERAL PO Take 1 tablet by mouth daily.   pilocarpine 1 % ophthalmic solution Commonly known as: PILOCAR Place 1 drop into both eyes 2 (two) times daily.   warfarin 2 MG tablet Commonly known as: Coumadin Take 1 tablet (2 mg total) by mouth daily. Start 11/02/2019 Start taking on: November 02, 2019 What changed:   medication strength  how much to take  additional instructions  These instructions start on November 02, 2019. If you are unsure what to do until then, ask your doctor or  other care provider.  Another medication with the same name was removed. Continue taking this medication, and follow the directions you see here.            Discharge Care Instructions  (From admission, onward)         Start     Ordered   10/31/19 0000  Discharge wound care:    Comments: Keep dressing in place Patient will follow up in 10 DAYS for wound check and suture removal if appropriate Call the office with concerns Radene Journey, MD   10/31/19 1507             Diet and Activity recommendation: See Discharge Instructions abov    Major procedures and Radiology Reports - PLEASE review detailed and final reports for all details, in brief -   Procedures : 4/22>> right BKA  Consults: Orthopedics   CT Chest Wo Contrast  Addendum Date: 10/21/2019   ADDENDUM REPORT: 10/21/2019 06:14 ADDENDUM: Ascending thoracic aortic aneurysm. Recommend semi-annual imaging followup by CTA or MRA and referral to cardiothoracic surgery if not already obtained. This recommendation follows 2010 ACCF/AHA/AATS/ACR/ASA/SCA/SCAI/SIR/STS/SVM Guidelines for the Diagnosis and Management of Patients With Thoracic Aortic Disease. Circulation. 2010; [id number]. Aortic aneurysm NOS (ICD10-I71.9) Electronically Signed   By: Ulyses Jarred M.D.   On: 10/21/2019 06:14   Result Date: 10/21/2019 CLINICAL DATA:  Pleural effusion EXAM: CT CHEST WITHOUT CONTRAST TECHNIQUE: Multidetector CT imaging of the chest was performed following the standard protocol without IV contrast. COMPARISON:  Chest radiograph 10/21/2019 FINDINGS: Cardiovascular: Calcific aortic atherosclerosis. Ascending thoracic aorta measures 4.5 cm. There is atherosclerotic calcification of the coronary arteries. Moderate cardiomegaly. Mediastinum/Nodes: No enlarged mediastinal or axillary lymph nodes. Thyroid gland, trachea, and esophagus demonstrate no significant findings. Lungs/Pleura: Intermediate sized pleural effusions. Left basilar atelectasis. Airways patent. Upper Abdomen: Status post cholecystectomy. Musculoskeletal: IMPRESSION: 1. Intermediate sized bilateral pleural effusions with left basilar atelectasis. 2. Coronary artery and. Ascending thoracic aortic aneurysm, measuring up to 4.5 cm. 3. Aortic Atherosclerosis (ICD10-I70.0). Electronically Signed: By: Ulyses Jarred M.D. On: 10/21/2019 04:36   MR HEEL RIGHT WO CONTRAST  Result Date: 10/21/2019 CLINICAL DATA:  Possible calcaneal osteomyelitis. EXAM: MR  OF THE RIGHT HEEL WITHOUT CONTRAST TECHNIQUE: Multiplanar, multisequence MR imaging of the ankle was performed. No intravenous contrast was administered. COMPARISON:  None. FINDINGS: Despite efforts by the technologist and patient, motion artifact is present on today's exam and could not be eliminated. This reduces exam sensitivity and specificity. TENDONS Peroneal: Mild common peroneus tenosynovitis. Posteromedial: Unremarkable Anterior: Unremarkable Achilles: Unremarkable Plantar Fascia: Mild thickening of the medial band of the plantar fascia with adjacent edema signal along the superficial margin of the plantar fascia. This may well reflect plantar fasciitis. LIGAMENTS Lateral: Unremarkable Medial: Unremarkable CARTILAGE Ankle Joint: Small posterior joint effusion. Subtalar Joints/Sinus Tarsi: Unremarkable Bones: There is a fracture of the calcaneus extending from the posterior calcaneal body to the posteroinferior margin of the heel as shown on axial images 14-24 of series 6. Along the lower margin of this fracture, there is widening of the fracture plane with accentuated edema, as well as a track of speckled gas densities extending from the fracture plane itself in the subcutaneous tissues towards the posteromedial cutaneous surface of the ankle is shown on images 21 through 25 of series 6. The appearance is highly suspicious for osteomyelitis complicating the posterior ankle fracture, and it is possible that osteomyelitis predisposed to fracture due to cortical weakening. I do not observe a drainable abscess. Other: Diffuse  subcutaneous edema along the the visualized ankle and foot. Cellulitis not excluded. This is more confluent along the posteromedial heel and associated with probable skin ulceration in this vicinity. IMPRESSION: 1. Fracture of the calcaneus extending from the posterior-superior calcaneal body to the posteroinferior margin of the heel. There is widening of the fracture plane with  accentuated edema signal along the lower margin of this fracture, and a track of speckled gas densities extending from the fracture plane itself towards the posteromedial cutaneous surface of the ankle. The appearance is highly suspicious for osteomyelitis, and it is possible that osteomyelitis predisposed to the fracture due to cortical weakening. 2. Diffuse subcutaneous edema along the visualized ankle and foot. Cellulitis not excluded. 3. Mild common peroneus tenosynovitis. 4. Mild thickening of the medial band of the plantar fascia with adjacent edema signal, may reflect plantar fasciitis. 5. Small posterior joint effusion at the tibiotalar joint. 6. Despite efforts by the technologist and patient, motion artifact is present on today's exam and could not be eliminated. This reduces exam sensitivity and specificity. Electronically Signed   By: Van Clines M.D.   On: 10/21/2019 13:48   DG Chest Portable 1 View  Result Date: 10/21/2019 CLINICAL DATA:  Shortness of breath EXAM: PORTABLE CHEST 1 VIEW COMPARISON:  08/08/2019 FINDINGS: Small left pleural effusion, slightly increased in size. Left basilar atelectasis. Cardiomediastinal contours are normal. There is calcific aortic atherosclerosis. Right lung is clear. IMPRESSION: Slight increase in size of small left pleural effusion with associated atelectasis. Electronically Signed   By: Ulyses Jarred M.D.   On: 10/21/2019 03:04   DG Foot Complete Left  Result Date: 10/23/2019 CLINICAL DATA:  86 year old female with ulceration of the left foot. EXAM: LEFT FOOT - COMPLETE 3+ VIEW COMPARISON:  None. FINDINGS: There is no acute fracture or dislocation. Prior pinning of the second and third metatarsal heads with chronic fracture and nonunion of the second metatarsal head adjacent to the surgical pain. The surgical pain of the second metatarsal extends outside of the bone and does not fix the fracture. There is moderate degenerative changes of the first MTP  joint with mild hallux valgus. The bones are osteopenic. Mild diffuse subcutaneous edema. No radiopaque foreign object or soft tissue gas. IMPRESSION: 1. No acute fracture or dislocation. 2. Prior pinning of the second and third metatarsal heads with chronic fracture and nonunion of the second metatarsal head. Electronically Signed   By: Anner Crete M.D.   On: 10/23/2019 15:26   DG Foot Complete Right  Result Date: 10/21/2019 CLINICAL DATA:  Evaluate for osteomyelitis EXAM: RIGHT FOOT COMPLETE - 3+ VIEW COMPARISON:  None. FINDINGS: Osteopenia. Hallux valgus deformity with secondary osteoarthritis of the first metatarsophalangeal joint. Suboptimal patient positioning. Diffuse soft tissue swelling. Soft tissue irregularity about the heel with suspicion of cortical irregularity and osteolysis within the subjacent calcaneus. Small Achilles and calcaneal spurs. IMPRESSION: 1. Soft tissue irregularity about the heel with underlying cortical irregularity and possible osteolysis. Findings are suspicious for osteomyelitis. 2. Hallux valgus deformity with secondary osteoarthritis. Electronically Signed   By: Abigail Miyamoto M.D.   On: 10/21/2019 06:00   DG Swallowing Func-Speech Pathology  Result Date: 10/23/2019 Objective Swallowing Evaluation: Type of Study: MBS-Modified Barium Swallow Study  Patient Details Name: VINIE ZILINSKI MRN: [id number] Date of Birth: [birth year] Today's Date: 10/23/2019 Time: SLP Start Time (ACUTE ONLY): 0850 -SLP Stop Time (ACUTE ONLY): 0920 SLP Time Calculation (min) (ACUTE ONLY): 30 min Past Medical History: Past Medical History: Diagnosis Date .  Anxiety  . Arthritis  . CAD (coronary artery disease) 2007  a. 1997 s/p PCI of diagonal. . Constipation  . COPD/Asthma  . Diverticulosis  . Esophageal reflux  . Family history of adverse reaction to anesthesia   daughter had trouble breathing after surgery, had to be reintubated . Glaucoma  . HTN (hypertension), benign  . Hyperlipidemia  .  Malignant melanoma ([place])   a. 09/2012 s/p resection. . Neuropathy   lower legs . PAF (paroxysmal atrial fibrillation) (HCC)   a. intolerant to beta blockers due to asthma and intolerant to CCB due to rash;  b. CHA2DS2VASc = 6-->chronic coumadin;  c. 03/2014 Echo: EF 60-65%, Gr 1 DD, triv AI, mildly dil RV, PASP 16mmhg. [name] TIA (transient ischemic attack)   a. 02/2011 - chronic coumadin in setting of PAF. [name] Urinary, incontinence, stress female  Past Surgical History: Past Surgical History: Procedure Laterality Date . ABDOMINAL HYSTERECTOMY   . APPENDECTOMY   . BACK SURGERY    lumbar laminectomy . CHOLECYSTECTOMY   . COLONOSCOPY   . CORONARY ANGIOPLASTY WITH STENT PLACEMENT   . EYE SURGERY   . NASAL SINUS SURGERY   . VESICOVAGINAL FISTULA CLOSURE W/ TAH   . WEIL OSTEOTOMY Left 06/12/2016  Procedure: Left Chevron and Aiken Left [name], Weil Osteotomy 2nd and 3rd Metatarsal;  Surgeon: Newt Minion, MD;  Location: [facility];  Service: Orthopedics;  Laterality: Left; HPI: Pt is a 86 y.o. female with medical history significant of HTN, HLD, persistent atrial fibrillation on Coumadin, CAD s/p stent, COPD/asthma, and anxiety who presented with complaints of shortness of breath. CT chest: Intermediate sized bilateral pleural effusions with left basilar atelectasis. Pt was noted to have "issues" swallowing pills in ED.  No data recorded Assessment / Plan / Recommendation CHL IP CLINICAL IMPRESSIONS 10/23/2019 Clinical Impression Pt demonstrates moderate pharyngeal dysphagia with silent aspriation of thin and nectar thick bolus during the swallow due to slightly late/incompletel aryngeal closure. A chin tuck was not beneficial in preventing aspiration, but controlling bolus size with spoon or small cups sips instead of straws did not result in aspiration with nectar thick liquids. Teaching a supra glottic swallow or supersupraglottic swallow are potential areas to investigate in therapy if warranted, though strategy was too comlpex  for initial attempts in [facility] suite. Recommend pt initaite a dys 3 mech soft diet with nectar thick liquids, no straw. Will need further discussion with pt and family regarding wishes and long term plan. There is only slightly decreased risk of aspiration with nectar and pt may wish to consume thin liquids accepting risk.  SLP Visit Diagnosis Dysphagia, pharyngeal phase (R13.13) Attention and concentration deficit following -- Frontal lobe and executive function deficit following -- Impact on safety and function Moderate aspiration risk   CHL IP TREATMENT RECOMMENDATION 10/23/2019 Treatment Recommendations F/U MBS in --- days (Comment)   Prognosis 10/23/2019 Prognosis for Safe Diet Advancement Good Barriers to Reach Goals -- Barriers/Prognosis Comment -- CHL IP DIET RECOMMENDATION 10/23/2019 SLP Diet Recommendations Dysphagia 3 (Mech soft) solids;Nectar thick liquid Liquid Administration via No straw;Cup;Spoon Medication Administration Whole meds with puree Compensations Small sips/bites;Slow rate Postural Changes Seated upright at 90 degrees   CHL IP OTHER RECOMMENDATIONS 10/23/2019 Recommended Consults -- Oral Care Recommendations Oral care BID Other Recommendations Order thickener from pharmacy   CHL IP FOLLOW UP RECOMMENDATIONS 10/23/2019 Follow up Recommendations Other (comment)   CHL IP FREQUENCY AND DURATION 10/23/2019 Speech Therapy Frequency (ACUTE ONLY) min 2x/week Treatment Duration 2 weeks  CHL IP ORAL PHASE 10/23/2019 Oral Phase WFL Oral - Pudding Teaspoon -- Oral - Pudding Cup -- Oral - Honey Teaspoon -- Oral - Honey Cup -- Oral - Nectar Teaspoon -- Oral - Nectar Cup -- Oral - Nectar Straw -- Oral - Thin Teaspoon -- Oral - Thin Cup -- Oral - Thin Straw -- Oral - Puree -- Oral - Mech Soft -- Oral - Regular -- Oral - Multi-Consistency -- Oral - Pill -- Oral Phase - Comment --  CHL IP PHARYNGEAL PHASE 10/23/2019 Pharyngeal Phase Impaired Pharyngeal- Pudding Teaspoon -- Pharyngeal -- Pharyngeal- Pudding Cup --  Pharyngeal -- Pharyngeal- Honey Teaspoon -- Pharyngeal -- Pharyngeal- Honey Cup -- Pharyngeal -- Pharyngeal- Nectar Teaspoon Pharyngeal residue - pyriform Pharyngeal -- Pharyngeal- Nectar Cup Pharyngeal residue - pyriform;WFL Pharyngeal -- Pharyngeal- Nectar Straw Penetration/Aspiration during swallow;Trace aspiration;Reduced airway/laryngeal closure Pharyngeal Material enters airway, passes BELOW cords without attempt by patient to eject out (silent aspiration) Pharyngeal- Thin Teaspoon Penetration/Aspiration during swallow;Penetration/Aspiration before swallow;Reduced airway/laryngeal closure;Trace aspiration Pharyngeal Material enters airway, passes BELOW cords without attempt by patient to eject out (silent aspiration) Pharyngeal- Thin Cup Penetration/Aspiration during swallow;Reduced airway/laryngeal closure;Trace aspiration Pharyngeal Material enters airway, passes BELOW cords without attempt by patient to eject out (silent aspiration) Pharyngeal- Thin Straw -- Pharyngeal -- Pharyngeal- Puree WFL Pharyngeal -- Pharyngeal- Mechanical Soft WFL Pharyngeal -- Pharyngeal- Regular -- Pharyngeal -- Pharyngeal- Multi-consistency -- Pharyngeal -- Pharyngeal- Pill -- Pharyngeal -- Pharyngeal Comment --  No flowsheet data found. DeBlois, Katherene Ponto 10/23/2019, 2:58 PM              ECHOCARDIOGRAM COMPLETE  Result Date: 10/22/2019    ECHOCARDIOGRAM REPORT   Patient Name:   IBUKUNOLUWA LEVENTIS Date of Exam: 10/22/2019 Medical Rec #:  [id number]              Height:       65.0 in Accession #:    [id number]             Weight:       138.0 lb Date of Birth:  04/15/1924              BSA:          1.690 m Patient Age:    43 years               BP:           102/67 mmHg Patient Gender: F                      HR:           88 bpm. Exam Location:  Inpatient Procedure: 2D Echo, Cardiac Doppler and Color Doppler Indications:    [id number] Acute diastolic (congestive) heart failure  History:        Patient has prior history of  Echocardiogram examinations, most                 recent 03/06/2015. Osteomyelitis.  Sonographer:    Merrie Roof RDCS Referring Phys: [id number] [place]  1. Left ventricular ejection fraction, by estimation, is 50 to 55%. The left ventricle has low normal function. The left ventricle has no regional wall motion abnormalities. There is mild left ventricular hypertrophy. Left ventricular diastolic parameters are indeterminate.  2. Right ventricular systolic function is mildly reduced. The right ventricular size is moderately enlarged. There is moderately elevated pulmonary artery systolic pressure. The estimated right ventricular systolic pressure is [id number] mmHg.  3.  Left atrial size was severely dilated.  4. Right atrial size was severely dilated.  5. Moderate pleural effusion in the left lateral region.  6. The mitral valve is normal in structure. Trivial mitral valve regurgitation.  7. The tricuspid valve is abnormal. Tricuspid valve regurgitation is severe.  8. The aortic valve is tricuspid. Aortic valve regurgitation is not visualized. Mild to moderate aortic valve sclerosis/calcification is present, without any evidence of aortic stenosis.  9. Aortic dilatation noted. There is dilatation of the ascending aorta measuring 42 mm. 10. The inferior vena cava is dilated in size with <50% respiratory variability, suggesting right atrial pressure of 15 mmHg. FINDINGS  Left Ventricle: Left ventricular ejection fraction, by estimation, is 50 to 55%. The left ventricle has low normal function. The left ventricle has no regional wall motion abnormalities. The left ventricular internal cavity size was normal in size. There is mild left ventricular hypertrophy. Left ventricular diastolic parameters are indeterminate. Right Ventricle: The right ventricular size is moderately enlarged. Right vetricular wall thickness was not assessed. Right ventricular systolic function is mildly reduced. There is moderately  elevated pulmonary artery systolic pressure. The tricuspid regurgitant velocity is 3.17 m/s, and with an assumed right atrial pressure of 15 mmHg, the estimated right ventricular systolic pressure is [id number] mmHg. Left Atrium: Left atrial size was severely dilated. Right Atrium: Right atrial size was severely dilated. Pericardium: Trivial pericardial effusion is present. Mitral Valve: The mitral valve is normal in structure. Trivial mitral valve regurgitation. Tricuspid Valve: The tricuspid valve is abnormal. Tricuspid valve regurgitation is severe. Aortic Valve: The aortic valve is tricuspid. Aortic valve regurgitation is not visualized. Mild to moderate aortic valve sclerosis/calcification is present, without any evidence of aortic stenosis. Pulmonic Valve: The pulmonic valve was not well visualized. Pulmonic valve regurgitation is not visualized. Aorta: Aortic dilatation noted. There is dilatation of the ascending aorta measuring 42 mm. Venous: The inferior vena cava is dilated in size with less than 50% respiratory variability, suggesting right atrial pressure of 15 mmHg. IAS/Shunts: No atrial level shunt detected by color flow Doppler. Additional Comments: There is a moderate pleural effusion in the left lateral region.  LEFT VENTRICLE PLAX 2D LVIDd:         4.10 cm LVIDs:         3.20 cm LV PW:         1.10 cm LV IVS:        1.10 cm LVOT diam:     1.80 cm LV SV:         45 LV SV Index:   27 LVOT Area:     2.54 cm  LV Volumes (MOD) LV vol d, MOD A4C: 43.2 ml LV vol s, MOD A4C: 16.6 ml LV SV MOD A4C:     43.2 ml RIGHT VENTRICLE             IVC RV S prime:     10.60 cm/s  IVC diam: 2.70 cm TAPSE (M-mode): 1.7 cm LEFT ATRIUM             Index       RIGHT ATRIUM           Index LA diam:        4.10 cm 2.43 cm/m  RA Area:     28.20 cm LA Vol (A2C):   94.6 ml 55.99 ml/m RA Volume:   95.20 ml  56.35 ml/m LA Vol (A4C):   71.9 ml 42.56 ml/m LA Biplane Vol: 91.3  ml 54.04 ml/m  AORTIC VALVE LVOT Vmax:   94.20 cm/s  LVOT Vmean:  63.100 cm/s LVOT VTI:    0.178 m  AORTA Ao Root diam: 3.50 cm Ao Asc diam:  4.20 cm TRICUSPID VALVE TR Peak grad:   40.2 mmHg TR Vmax:        317.00 cm/s  SHUNTS Systemic VTI:  0.18 m Systemic Diam: 1.80 cm Oswaldo Milian MD Electronically signed by Oswaldo Milian MD Signature Date/Time: 10/22/2019/7:27:27 PM    Final    VAS [place] LOWER EXTREMITY VENOUS (DVT) (ONLY [facility] & [facility])  Result Date: 10/21/2019  Lower Venous DVTStudy Indications: Swelling, and ulceration.  Limitations: Clothing, confusion, and movement. Comparison Study: Prior study from 09/28/14 is available for comparison Performing Technologist: Sharion Dove RVS  Examination Guidelines: A complete evaluation includes B-mode imaging, spectral Doppler, color Doppler, and power Doppler as needed of all accessible portions of each vessel. Bilateral testing is considered an integral part of a complete examination. Limited examinations for reoccurring indications may be performed as noted. The reflux portion of the exam is performed with the patient in reverse Trendelenburg.  +---------+---------------+---------+-----------+----------+--------------+ RIGHT    CompressibilityPhasicitySpontaneityPropertiesThrombus Aging +---------+---------------+---------+-----------+----------+--------------+ CFV      Full           Yes      Yes                                 +---------+---------------+---------+-----------+----------+--------------+ SFJ      Full                                                        +---------+---------------+---------+-----------+----------+--------------+ FV Prox  Full                                                        +---------+---------------+---------+-----------+----------+--------------+ FV Mid   Full                                                        +---------+---------------+---------+-----------+----------+--------------+ FV DistalFull                                                         +---------+---------------+---------+-----------+----------+--------------+ PFV      Full                                                        +---------+---------------+---------+-----------+----------+--------------+ POP      Full           Yes      Yes                                 +---------+---------------+---------+-----------+----------+--------------+  PTV      Full                                                        +---------+---------------+---------+-----------+----------+--------------+ PERO     Full                                                        +---------+---------------+---------+-----------+----------+--------------+   Left Technical Findings: Left leg not evaluated.   Summary: RIGHT: - Findings appear essentially unchanged compared to previous examination. - There is no evidence of deep vein thrombosis in the lower extremity.   *See table(s) above for measurements and observations. Electronically signed by Monica Martinez MD on 10/21/2019 at 3:11:05 PM.    Final     Micro Results    Recent Results (from the past 240 hour(s))  Surgical PCR screen     Status: Abnormal   Collection Time: 10/26/19  7:19 AM   Specimen: Nasal Mucosa; Nasal Swab  Result Value Ref Range Status   MRSA, PCR NEGATIVE NEGATIVE Final   Staphylococcus aureus POSITIVE (A) NEGATIVE Final    Comment: (NOTE) The Xpert SA Assay (FDA approved for NASAL specimens in patients 26 years of age and older), is one component of a comprehensive surveillance program. It is not intended to diagnose infection nor to guide or monitor treatment. Performed at [facility], [place] [address] [place], [place] [address]   SARS CORONAVIRUS 2 (TAT 6-24 HRS) Nasopharyngeal Nasopharyngeal Swab     Status: None   Collection Time: 10/30/19 10:41 AM   Specimen: Nasopharyngeal Swab  Result Value Ref Range Status   SARS Coronavirus 2 NEGATIVE NEGATIVE Final     Comment: (NOTE) SARS-CoV-2 target nucleic acids are NOT DETECTED. The SARS-CoV-2 RNA is generally detectable in upper and lower respiratory specimens during the acute phase of infection. Negative results do not preclude SARS-CoV-2 infection, do not rule out co-infections with other pathogens, and should not be used as the sole basis for treatment or other patient management decisions. Negative results must be combined with clinical observations, patient history, and epidemiological information. The expected result is Negative. Fact Sheet for Patients: SugarRoll.be Fact Sheet for Healthcare Providers: https://www.woods-mathews.com/ This test is not yet approved or cleared by the [place] FDA and  has been authorized for detection and/or diagnosis of SARS-CoV-2 by FDA under an Emergency Use Authorization (EUA). This EUA will remain  in effect (meaning this test can be used) for the duration of the COVID-19 declaration under Section 56 4(b)(1) of the Act, 21 U.S.C. section 360bbb-3(b)(1), unless the authorization is terminated or revoked sooner. Performed at [facility], [place] [address] [place], [place] [address]        Today   Subjective:   Janit Lattin today has no headache,no chest or abdominal pain, has mild nausea today which resolved after Zofran, appetite mildly improved, but she is able to finish her ensures .[name]   Objective:   Blood pressure 132/72, pulse 73, temperature 98.4 F (36.9 C), temperature source Oral, resp. rate 18, height 5\' 5"  (1.651 m), weight 53 kg, SpO2 94 %.   Intake/Output Summary (Last  24 hours) at 10/31/2019 1510 Last data filed at 10/31/2019 1128 Gross per 24 hour  Intake 161.46 ml  Output 400 ml  Net -238.54 ml    Exam Awake Alert, Oriented x 3, No new F.N deficits, Normal affectd.  Symmetrical Chest wall movement, Good air movement bilaterally, CTAB Irregular irregular, no  Gallops,Rubs or new Murmurs, No Parasternal Heave +ve B.Sounds, Abd Soft, Non tender,  No rebound -guarding or rigidity. No Cyanosis, Clubbing or edema, Right BKA with ACE wrapping, no blood or oozing could be seen through dressing  Data Review   CBC w Diff:  Lab Results  Component Value Date   WBC 9.0 10/30/2019   HGB 8.6 (L) 10/30/2019   HCT 27.8 (L) 10/30/2019   HCT 22.7 (L) 10/22/2019   PLT  10/30/2019    PLATELET CLUMPS NOTED ON SMEAR, COUNT APPEARS ADEQUATE   LYMPHOPCT 2 10/21/2019   MONOPCT 1 10/21/2019   EOSPCT 0 10/21/2019   BASOPCT 0 10/21/2019    CMP:  Lab Results  Component Value Date   NA 144 10/30/2019   K 3.6 10/30/2019   CL 100 10/30/2019   CO2 37 (H) 10/30/2019   BUN 23 10/30/2019   CREATININE 0.95 10/30/2019   CREATININE 0.93 (H) 09/24/2015   PROT 5.9 (L) 10/23/2019   ALBUMIN 2.3 (L) 10/23/2019   BILITOT 0.6 10/23/2019   ALKPHOS 68 10/23/2019   AST 24 10/23/2019   ALT 17 10/23/2019  .   Total Time in preparing paper work, data evaluation and todays exam - 50 minutes  Phillips Climes M.D on 10/31/2019 at 3:10 PM  [facility]   Office  [phone]

## 2019-10-31 NOTE — TOC Transition Note (Signed)
Transition of Care [facility]) - CM/SW Discharge Note   Patient Details  Name: [name] MRN: [id number] Date of Birth: 03-May-1924  [facility] of [facility]) CM/SW Contact:  Benard Halsted, LCSW Phone Number: 10/31/2019, 3:56 PM   Clinical Narrative:    Patient will DC to: [facility] Anticipated DC date: 10/31/19 Family notified: Granddaughter, Advertising copywriter by: Corey Harold   Per MD patient ready for DC to [facility]. RN, patient, patient's family, and facility notified of DC. Discharge Summary and FL2 sent to facility. RN to call report prior to discharge [phone]). DC packet on chart. Ambulance transport requested for patient.   CSW will sign off for now as social work intervention is no longer needed. Please consult [place] again if new needs arise.      Final next level of care: Skilled Nursing Facility Barriers to Discharge: Barriers Resolved   Patient Goals and CMS Choice Patient states their goals for this hospitalization and ongoing recovery are:: Rehab CMS Medicare.gov Compare Post Acute Care list provided to:: Patient Represenative (must comment)(Daughter and Granddaughter) Choice offered to / list presented to : Adult Children(and Granddaughter Colletta [place])  Discharge Placement   Existing PASRR number confirmed : 10/31/19          Patient chooses bed at: [facility] Patient to be transferred to facility by: [facility] Name of family member notified: Colletta [place], granddaughter Patient and family notified of of transfer: 10/31/19  Discharge Plan and Services In-house Referral: Clinical Social Work   Post Acute Care Choice: [place]                               Social Determinants of Health (SDOH) Interventions     Readmission Risk Interventions Readmission Risk Prevention Plan 10/26/2019  Transportation Screening Complete  PCP or Specialist Appt within 3-5 Days Complete  HRI or [place] Complete   Social Work Consult for [place] Planning/Counseling Complete  Palliative Care Screening Not Applicable  Medication Review Press photographer) Complete  Some recent data might be hidden

## 2019-10-31 NOTE — Progress Notes (Signed)
ANTICOAGULATION - Follow Up Consult  Pharmacy Consult for Warfarin Indication: atrial fibrillation  Allergies  Allergen Reactions  . Alphagan [Brimonidine] Other (See Comments)    Burning sensation  . Apraclonidine Other (See Comments)    Pain, brow pain, tender, not able to tolerate  . Biaxin [Clarithromycin] Other (See Comments)    Unknown, per pt   . Brovana [Arformoterol] Other (See Comments)    Makes my heart race  . Budesonide Other (See Comments)    Makes my heart race  . Cefdinir Other (See Comments)    "FEEL HORRIBLE"  . Cephalexin Itching    Pt tolerated Rocephin 08/2019  . Cifenline Itching  . Clonidine Other (See Comments)    unknown  . Clonidine Derivatives Other (See Comments)    REACTION: unknown  . Dorzolamide Other (See Comments)    Redness  . Iodinated Diagnostic Agents Other (See Comments)    unknown  . Iohexol Other (See Comments)     Desc: NOTES FROM PRIOR CT STATES PRE MEDICATION PRIOR TO OMNIPAQUE ENHANCED CT   . Nsaids Other (See Comments)    Unknown  . Penicillins Itching, Swelling and Other (See Comments)    Swelling and edema of ears and rash Swelling to ears  Has patient had a PCN reaction causing immediate rash, facial/tongue/throat swelling, SOB or lightheadedness with hypotension: {no Has patient had a PCN reaction causing severe rash involving mucus membranes or skin necrosis: {no Has patient had a PCN reaction that required hospitalization no Has patient had a PCN reaction occurring within the last 10 years: no If all of the above answers are "NO", then may proceed with Cephalosporin use.  . Sulfamethoxazole Other (See Comments)    Unknown  . Sulfonamide Derivatives Itching  . Tetracycline Itching and Other (See Comments)    unknown  . Timolol Other (See Comments)    Eye red and irritated   . Vancomycin Other (See Comments)    Turn red  . Zetia [Ezetimibe] Other (See Comments)    Unknown  . Cardizem [Diltiazem Hcl] Rash     Patient Measurements: Height: 5\' 5"  (165.1 cm) Weight: 53 kg (116 lb 13.5 oz) IBW/kg (Calculated) : 57  Vital Signs: Temp: 98.4 F (36.9 C) (04/27 1213) Temp Source: Oral (04/27 1213) BP: 132/72 (04/27 1213) Pulse Rate: 73 (04/27 1213)  Labs: Recent Labs    10/29/19 0544 10/30/19 0501 10/31/19 0349  HGB 8.4* 8.6*  --   HCT 26.7* 27.8*  --   PLT 136* PLATELET CLUMPS NOTED ON SMEAR, COUNT APPEARS ADEQUATE  --   LABPROT 23.7* 32.4* 33.5*  INR 2.1* 3.2* 3.3*  CREATININE 1.06* 0.95  --     Estimated Creatinine Clearance: 31 mL/min (by C-G formula based on SCr of 0.95 mg/dL).  Assessment:   64 YOF presenting with SOB, on warfarin for Afib.  INR on admission supratherapeutic at 9.3, received vitamin K 2.5 mg PO on 4/17. INR was slow to decline > only down to 5.3 on 4/21. Received Vitamin K 5 mg PO on 4/21 and 2.5 mg 4/21 pm > down to 2.1 on 4/22 and now s/p R BKA.  Warfarin resumed POD#1 on 4/23.    INR remains supratherapeutic (3.3) after warfarin 3 mg x 2 days then 2 mg x 1, then holding dose 4/26.  Likely due to concurrent Flagyl, day # 7 of 14 planned. Also on Ceftriaxone for Bacteroides in blood culture 4/22.     PTA warfarin regimen: 4 mg daily.  Goal of Therapy:  INR 2-3 Monitor platelets by anticoagulation protocol: Yes    Plan:   Hold warfarin today.  Sensitivity likely due to concurrent Flagyl and also not much PO intake.  Daily PT/INR.  Noted planning 14 days of Flagyl. Will also need to follow closely when Flagyl course ends.     Arty Baumgartner, [place] Phone: [phone] 10/31/2019,12:36 PM

## 2019-10-31 NOTE — TOC Progression Note (Signed)
Transition of Care [facility]) - Progression Note    Patient Details  Name: [name] MRN: [id number] Date of Birth: Apr 30, 1924  Transition of Care [facility]) CM/SW [place], LCSW Phone Number: 10/31/2019, 9:46 AM  Clinical Narrative:    [facility] still awaiting insurance approval. Negative COVID test has resulted.    Expected Discharge Plan: Skilled Nursing Facility Barriers to Discharge: Insurance Authorization  Expected Discharge Plan and Services Expected Discharge Plan: [place] In-house Referral: Clinical Social Work   Post Acute Care Choice: [place] Living arrangements for the past 2 months: Single Family Home                                       Social Determinants of Health (SDOH) Interventions    Readmission Risk Interventions Readmission Risk Prevention Plan 10/26/2019  Transportation Screening Complete  PCP or Specialist Appt within 3-5 Days Complete  HRI or [place] Complete  Social Work Consult for [place] Planning/Counseling Complete  Palliative Care Screening Not Applicable  Medication Review Press photographer) Complete  Some recent data might be hidden

## 2019-10-31 NOTE — Discharge Instructions (Signed)
Follow with Primary MD Julian Hy, PA-C after Discharge from SNF.  Get CBC, CMP,checked  by Primary MD next visit.    Disposition SNF   Diet: Regular diet , with feeding assistance and aspiration precautions.   On your next visit with your primary care physician please Get Medicines reviewed and adjusted.   Please request your Prim.MD to go over all Hospital Tests and Procedure/Radiological results at the follow up, please get all Hospital records sent to your Prim MD by signing hospital release before you go home.   If you experience worsening of your admission symptoms, develop shortness of breath, life threatening emergency, suicidal or homicidal thoughts you must seek medical attention immediately by calling 911 or calling your MD immediately  if symptoms less severe.  You Must read complete instructions/literature along with all the possible adverse reactions/side effects for all the Medicines you take and that have been prescribed to you. Take any new Medicines after you have completely understood and accpet all the possible adverse reactions/side effects.   Do not drive, operating heavy machinery, perform activities at heights, swimming or participation in water activities or provide baby sitting services if your were admitted for syncope or siezures until you have seen by Primary MD or a Neurologist and advised to do so again.  Do not drive when taking Pain medications.    Do not take more than prescribed Pain, Sleep and Anxiety Medications  Special Instructions: If you have smoked or chewed Tobacco  in the last 2 yrs please stop smoking, stop any regular Alcohol  and or any Recreational drug use.  Wear Seat belts while driving.   Please note  You were cared for by a hospitalist during your hospital stay. If you have any questions about your discharge medications or the care you received while you were in the hospital after you are discharged, you can call the unit  and asked to speak with the hospitalist on call if the hospitalist that took care of you is not available. Once you are discharged, your primary care physician will handle any further medical issues. Please note that NO REFILLS for any discharge medications will be authorized once you are discharged, as it is imperative that you return to your primary care physician (or establish a relationship with a primary care physician if you do not have one) for your aftercare needs so that they can reassess your need for medications and monitor your lab values.

## 2019-11-07 ENCOUNTER — Encounter (HOSPITAL_COMMUNITY): Payer: Self-pay

## 2019-11-07 ENCOUNTER — Emergency Department (HOSPITAL_COMMUNITY)
Admission: EM | Admit: 2019-11-07 | Discharge: 2019-11-07 | Disposition: A | Discharge status: Home / Self Care | Payer: Medicare HMO | Attending: Emergency Medicine | Admitting: Emergency Medicine

## 2019-11-07 DIAGNOSIS — I2510 Atherosclerotic heart disease of native coronary artery without angina pectoris: Secondary | ICD-10-CM | POA: Insufficient documentation

## 2019-11-07 DIAGNOSIS — Z87891 Personal history of nicotine dependence: Secondary | ICD-10-CM | POA: Diagnosis not present

## 2019-11-07 DIAGNOSIS — Z79899 Other long term (current) drug therapy: Secondary | ICD-10-CM | POA: Insufficient documentation

## 2019-11-07 DIAGNOSIS — R791 Abnormal coagulation profile: Secondary | ICD-10-CM | POA: Insufficient documentation

## 2019-11-07 DIAGNOSIS — Z7901 Long term (current) use of anticoagulants: Secondary | ICD-10-CM | POA: Diagnosis not present

## 2019-11-07 DIAGNOSIS — J449 Chronic obstructive pulmonary disease, unspecified: Secondary | ICD-10-CM | POA: Diagnosis not present

## 2019-11-07 DIAGNOSIS — R799 Abnormal finding of blood chemistry, unspecified: Secondary | ICD-10-CM | POA: Diagnosis present

## 2019-11-07 DIAGNOSIS — I4891 Unspecified atrial fibrillation: Secondary | ICD-10-CM | POA: Diagnosis not present

## 2019-11-07 DIAGNOSIS — I10 Essential (primary) hypertension: Secondary | ICD-10-CM | POA: Diagnosis not present

## 2019-11-07 LAB — CBC
HCT: 32.6 % — ABNORMAL LOW (ref 36.0–46.0)
Hemoglobin: 10.1 g/dL — ABNORMAL LOW (ref 12.0–15.0)
MCH: 32 pg (ref 26.0–34.0)
MCHC: 31 g/dL (ref 30.0–36.0)
MCV: 103.2 fL — ABNORMAL HIGH (ref 80.0–100.0)
Platelets: 243 10*3/uL (ref 150–400)
RBC: 3.16 MIL/uL — ABNORMAL LOW (ref 3.87–5.11)
RDW: 19.7 % — ABNORMAL HIGH (ref 11.5–15.5)
WBC: 7.8 10*3/uL (ref 4.0–10.5)
nRBC: 0 % (ref 0.0–0.2)

## 2019-11-07 LAB — BASIC METABOLIC PANEL
Anion gap: 6 (ref 5–15)
BUN: 16 mg/dL (ref 8–23)
CO2: 32 mmol/L (ref 22–32)
Calcium: 8.5 mg/dL — ABNORMAL LOW (ref 8.9–10.3)
Chloride: 102 mmol/L (ref 98–111)
Creatinine, Ser: 0.93 mg/dL (ref 0.44–1.00)
GFR calc Af Amer: >60 mL/min (ref >60)
GFR calc non Af Amer: 53 mL/min — ABNORMAL LOW (ref >60)
Glucose, Bld: 107 mg/dL — ABNORMAL HIGH (ref 70–99)
Potassium: 4.1 mmol/L (ref 3.5–5.1)
Sodium: 140 mmol/L (ref 135–145)

## 2019-11-07 LAB — PROTIME-INR
INR: 4.4 (ref 0.8–1.2)
Prothrombin Time: 40.8 seconds — ABNORMAL HIGH (ref 11.4–15.2)

## 2019-11-07 NOTE — Discharge Instructions (Addendum)
Your INR here in the emergency department was 4.4.  You are not actively bleeding.  Vitamin K, FFP is not currently indicated.  I recommend that you hold your Coumadin dose for the next 3 days and have your INR re-check at that time.

## 2019-11-07 NOTE — ED Triage Notes (Signed)
Pt comes via [facility] from [place] for abd lab values, Pt was 53.4 and INR critical high level, sent here to receive vitamin K.

## 2019-11-07 NOTE — ED Provider Notes (Signed)
TIME SEEN: 5:10 AM  CHIEF COMPLAINT: Supratherapeutic INR  HPI: Patient is a 86 year old female with history of A. fib on Coumadin who was sent from [facility] rehab facility for concerns for supratherapeutic INR.  INR was measuring critically high with PT of 53.4.  She denies any complaints at this time and states she is not sure why she is here.  She denies any chest pain, shortness of breath, abdominal pain.  No bleeding gums, easy bruising, bloody stools, melena, hematemesis, vaginal bleeding.  Currently in a rehab facility after right lower extremity BKA on 10/26/2019 by Dr. Lucia Gaskins.  No bleeding from her wound.  ROS: See HPI Constitutional: no fever  Eyes: no drainage  ENT: no runny nose   Cardiovascular:  no chest pain  Resp: no SOB  GI: no vomiting GU: no dysuria Integumentary: no rash  Allergy: no hives  Musculoskeletal: no leg swelling  Neurological: no slurred speech ROS otherwise negative  PAST MEDICAL HISTORY/PAST SURGICAL HISTORY:  Past Medical History:  Diagnosis Date  . Anxiety   . Arthritis   . CAD (coronary artery disease) 2007   a. 1997 s/p PCI of diagonal.  . Constipation   . COPD/Asthma   . Diverticulosis   . Esophageal reflux   . Family history of adverse reaction to anesthesia    daughter had trouble breathing after surgery, had to be reintubated  . Glaucoma   . HTN (hypertension), benign   . Hyperlipidemia   . Malignant melanoma ([place])    a. 09/2012 s/p resection.  . Neuropathy    lower legs  . PAF (paroxysmal atrial fibrillation) (HCC)    a. intolerant to beta blockers due to asthma and intolerant to CCB due to rash;  b. CHA2DS2VASc = 6-->chronic coumadin;  c. 03/2014 Echo: EF 60-65%, Gr 1 DD, triv AI, mildly dil RV, PASP 6mmhg.  [name] TIA (transient ischemic attack)    a. 02/2011 - chronic coumadin in setting of PAF.  [name] Urinary, incontinence, stress female     MEDICATIONS:  Prior to Admission medications   Medication Sig Start Date End Date Taking?  Authorizing Provider  acetaminophen (TYLENOL) 325 MG tablet Take 2 tablets (650 mg total) by mouth every 6 (six) hours as needed for mild pain (or Fever >/= 101). 10/31/19   Elgergawy, Silver Huguenin, MD  albuterol (PROAIR HFA) 108 (90 Base) MCG/ACT inhaler Inhale 1-2 puffs into the lungs every 4 (four) hours as needed for wheezing or shortness of breath. 12/11/15   Juanito Doom, MD  albuterol (PROVENTIL) (2.5 MG/3ML) 0.083% nebulizer solution Take 3 mLs (2.5 mg total) by nebulization every 6 (six) hours as needed for wheezing or shortness of breath. 07/29/18   Juanito Doom, MD  ALPRAZolam Duanne Moron) 0.5 MG tablet Take 1 tablet (0.5 mg total) by mouth 2 (two) times daily as needed for anxiety. 10/31/19   Elgergawy, Silver Huguenin, MD  cetirizine (ZYRTEC) 10 MG tablet Take 10 mg by mouth daily as needed for allergies.    [provider]  citalopram (CELEXA) 10 MG tablet Take 10 mg by mouth daily.    [provider]  clotrimazole-betamethasone (LOTRISONE) cream Apply 1 application topically 2 (two) times daily as needed for skin breakdown. 07/13/19   [provider]  feeding supplement, ENSURE ENLIVE, (ENSURE ENLIVE) LIQD Take 237 mLs by mouth 3 (three) times daily between meals. 10/31/19   Elgergawy, Silver Huguenin, MD  fluticasone (FLONASE) 50 MCG/ACT nasal spray USE 1 SPRAY IN EACH NOSTRIL ONCE A  DAY AS NEEDED FOR ALLERGIES OR RHINITIS Patient taking differently: Place 1 spray into both nostrils daily as needed for allergies or rhinitis.  04/25/19   Juanito Doom, MD  fluticasone furoate-vilanterol (BREO ELLIPTA) 200-25 MCG/INH AEPB Inhale 1 puff into the lungs daily. 01/27/17   Juanito Doom, MD  furosemide (LASIX) 20 MG tablet Take 1 tablet (20 mg total) by mouth daily. 11/01/19   Elgergawy, Silver Huguenin, MD  latanoprost (XALATAN) 0.005 % ophthalmic solution Place 1 drop into both eyes at bedtime.    [provider]  loperamide (IMODIUM A-D) 2 MG tablet Take 2 mg by mouth  daily as needed for diarrhea or loose stools.    [provider]  metoprolol tartrate (LOPRESSOR) 25 MG tablet Take 1 tablet (25 mg total) by mouth 4 (four) times daily. 10/31/19   Elgergawy, Silver Huguenin, MD  metroNIDAZOLE (FLAGYL) 500 MG tablet Take 1 tablet (500 mg total) by mouth every 8 (eight) hours for 7 days. 10/31/19 11/07/19  Elgergawy, Silver Huguenin, MD  mirtazapine (REMERON SOL-TAB) 15 MG disintegrating tablet Take 1 tablet (15 mg total) by mouth at bedtime. 10/31/19 10/30/20  Elgergawy, Silver Huguenin, MD  montelukast (SINGULAIR) 10 MG tablet TAKE 1 TABLET BY MOUTH EVERYDAY AT BEDTIME Patient taking differently: Take 10 mg by mouth at bedtime.  05/29/19   Martyn Ehrich, NP  Multiple Vitamins-Minerals (MULTIVITAMIN & MINERAL PO) Take 1 tablet by mouth daily.    [provider]  pilocarpine (PILOCAR) 1 % ophthalmic solution Place 1 drop into both eyes 2 (two) times daily.  12/30/17   [provider]  warfarin (COUMADIN) 2 MG tablet Take 1 tablet (2 mg total) by mouth daily. Start 11/02/2019 11/02/19 11/01/20  Elgergawy, Silver Huguenin, MD    ALLERGIES:  Allergies  Allergen Reactions  . Alphagan [Brimonidine] Other (See Comments)    Burning sensation  . Apraclonidine Other (See Comments)    Pain, brow pain, tender, not able to tolerate  . Biaxin [Clarithromycin] Other (See Comments)    Unknown, per pt   . Brovana [Arformoterol] Other (See Comments)    Makes my heart race  . Budesonide Other (See Comments)    Makes my heart race  . Cefdinir Other (See Comments)    "FEEL HORRIBLE"  . Cephalexin Itching    Pt tolerated Rocephin 08/2019  . Cifenline Itching  . Clonidine Other (See Comments)    unknown  . Clonidine Derivatives Other (See Comments)    REACTION: unknown  . Dorzolamide Other (See Comments)    Redness  . Iodinated Diagnostic Agents Other (See Comments)    unknown  . Iohexol Other (See Comments)     Desc: NOTES FROM PRIOR CT STATES PRE MEDICATION PRIOR TO  OMNIPAQUE ENHANCED CT   . Nsaids Other (See Comments)    Unknown  . Penicillins Itching, Swelling and Other (See Comments)    Swelling and edema of ears and rash Swelling to ears  Has patient had a PCN reaction causing immediate rash, facial/tongue/throat swelling, SOB or lightheadedness with hypotension: {no Has patient had a PCN reaction causing severe rash involving mucus membranes or skin necrosis: {no Has patient had a PCN reaction that required hospitalization no Has patient had a PCN reaction occurring within the last 10 years: no If all of the above answers are "NO", then may proceed with Cephalosporin use.  . Sulfamethoxazole Other (See Comments)    Unknown  . Sulfonamide Derivatives Itching  . Tetracycline Itching and Other (See  Comments)    unknown  . Timolol Other (See Comments)    Eye red and irritated   . Vancomycin Other (See Comments)    Turn red  . Zetia [Ezetimibe] Other (See Comments)    Unknown  . Cardizem [Diltiazem Hcl] Rash    SOCIAL HISTORY:  Social History   Tobacco Use  . Smoking status: Former Smoker    Packs/day: 0.20    Years: 25.00    Pack years: 5.00    Types: Cigarettes    Quit date: 07/06/1966    Years since quitting: 53.3  . Smokeless tobacco: Never Used  Substance Use Topics  . Alcohol use: No    Alcohol/week: 0.0 standard drinks    FAMILY HISTORY: Family History  Problem Relation Age of Onset  . Heart disease Father   . Hemachromatosis Father   . Heart attack Father   . Colon cancer Mother   . Peripheral vascular disease Mother   . Peripheral vascular disease Sister   . Cancer Sister   . Lymphoma Sister   . Heart attack Brother   . Heart disease Brother     EXAM: BP 127/83 (BP Location: Left Arm)   Pulse (!) 106   Temp 98.4 F (36.9 C) (Oral)   Resp 16   SpO2 94%  CONSTITUTIONAL: Alert and oriented and responds appropriately to questions. Well-appearing; well-nourished, elderly, no distress HEAD:  Normocephalic EYES: Conjunctivae clear, pupils appear equal, EOM appear intact ENT: normal nose; moist mucous membranes NECK: Supple, normal ROM CARD: Irregularly irregular and minimally tachycardic; S1 and S2 appreciated; no murmurs, no clicks, no rubs, no gallops RESP: Normal chest excursion without splinting or tachypnea; breath sounds clear and equal bilaterally; no wheezes, no rhonchi, no rales, no hypoxia or respiratory distress, speaking full sentences ABD/GI: Normal bowel sounds; non-distended; soft, non-tender, no rebound, no guarding, no peritoneal signs, no hepatosplenomegaly BACK:  The back appears normal EXT: Normal ROM in all joints; no deformity noted, no edema; no cyanosis, status post right BKA with no active bleeding or drainage SKIN: Normal color for age and race; warm; no rash on exposed skin NEURO: Moves all extremities equally PSYCH: The patient's mood and manner are appropriate.   MEDICAL DECISION MAKING: Patient here with concerns for supratherapeutic INR.  INR currently 4.4.  No active bleeding.  Vitamin K, FFP, Kcentra or other reversal agents not currently recommended.  Patient has no complaints.  Hemodynamically stable.  Will discharge back to nursing facility.  I attempted to contact staff at [phone] without answer.  The rest of her labs have been reviewed/interpreted.  Hemoglobin is 10.1.  Confirmed this with lab.  She does have some platelet clumping.  Normal electrolytes, renal function.  At this time, I do not feel there is any life-threatening condition present. I have reviewed, interpreted and discussed all results (EKG, imaging, lab, urine as appropriate) and exam findings with patient/family. I have reviewed nursing notes and appropriate previous records.  I feel the patient is safe to be discharged home without further emergent workup and can continue workup as an outpatient as needed. Discussed usual and customary return precautions. Patient/family  verbalize understanding and are comfortable with this plan.  Outpatient follow-up has been provided as needed. All questions have been answered.    [name] was evaluated in Emergency Department on 11/07/2019 for the symptoms described in the history of present illness. She was evaluated in the context of the global COVID-19 pandemic, which necessitated consideration that the patient  might be at risk for infection with the SARS-CoV-2 virus that causes COVID-19. Institutional protocols and algorithms that pertain to the evaluation of patients at risk for COVID-19 are in a state of rapid change based on information released by regulatory bodies including the CDC and federal and state organizations. These policies and algorithms were followed during the patient's care in the ED.      Urbano Milhouse, Delice Bison, DO 11/07/19 [phone]

## 2019-11-08 NOTE — Op Note (Signed)
10/26/2019  1:04 PM  PATIENT:  [name]  86 y.o. female  PRE-OPERATIVE DIAGNOSIS:  osteomylitis right foot  POST-OPERATIVE DIAGNOSIS:  osteomylitis right foot  PROCEDURE:  Procedure(s): AMPUTATION BELOW KNEE (Right)  SURGEON:  Surgeon(s) and Role:    * Erle Crocker, MD - Primary  PHYSICIAN ASSISTANT:   ASSISTANTS: Justin RNFA   ANESTHESIA:   MAC, peripheral nerve blockade  EBL:  100 mL   BLOOD ADMINISTERED:none  DRAINS: none   LOCAL MEDICATIONS USED:  NONE  SPECIMEN:  Source of Specimen:  Right leg  DISPOSITION OF SPECIMEN:  PATHOLOGY  Indications: Patient is a 86 year old female who had posterior calcaneal ulceration with underlying osteomyelitis and calcaneus fracture.  There is edema within most of the body of the calcaneus.  We had a long discussion with the family regarding treatment which included continued conservative treatment with antibiotic suppression versus partial calcanectomy versus below-the-knee amputation.  They were interested in the most definitive treatment possible which was below the knee amputation.  We discussed the risks, benefits and alternatives of surgery which include but are not limited to wound healing complications, infection, need for further surgery and continued pain and disability.  After weighing these risks they opted to proceed with surgery.  Procedure: Patient was identified in the preoperative holding area.  The right leg was marked by myself.  Consent was on the myself and the patient.  Nerve block was performed by anesthesia.  She was taken to the operative suite. The patient was placed supine. A tourniquet was positioned in the left proximal leg but not inflated. The area was prepped and draped in the usual aseptic fashion. A incision was Made approximately 10-12 cm distal to the tibial tubercle anteriorly with posterior skin flap.after the tourniquet was inflated. The incision was continued to  boneAnteriorly. The fibula was identified and was transected with a sagittal saw.The Tibia bone was transected with the Sagittal saw. Then the posterior compartment musculature was removed from the posterior aspect of the tibia using a amputation knife.The specimen was removed. The tourniquet was deflated. The Bloodvessels were identified and suture ligated using a 2-0 silk. Other bleeding points were treated with electrosurgical cautery. The area was irrigated and examined for hemostasis. Then the posterior soft tissue flap was debulked.  The soft tissue flap was closed in a layered fashion with the deep fascial component being sutured to the periosteal of the anterior tibia.  Once closure was performed Xeroform,4x4s, sterile cast padding, and an Ace were applied. The patient tolerated the procedure well. There were no apparent complications.   COUNTS:  YES  TOURNIQUET:   Total Tourniquet Time Documented: Thigh (Right) - 21 minutes Total: Thigh (Right) - 21 minutes   PLAN OF CARE: Admit to inpatient   PATIENT DISPOSITION:  PACU - hemodynamically stable.   Delay start of Pharmacological VTE agent (>24hrs) due to surgical blood loss or risk of bleeding: yes

## 2019-12-25 ENCOUNTER — Telehealth: Payer: Self-pay | Admitting: Internal Medicine

## 2019-12-25 NOTE — Telephone Encounter (Signed)
Patient is on schedule  to restart Xolair 375mg  every 2 weeks on 01/01/20. Patient was last seen in [place] by Dr Shearon Stalls. Dr Shearon Stalls recommended Patient starting back on Xolair.  Per 07/27/19 OV with Brian,NP recommendation to restart Xolair. Patient had Xolair at office that was delivered 12/30/18, but it has been since disposed of, because expiration date was 09/2019. Patient will need a new Xolair enrollment started and prior authorization through [employer].  Message routed to pharm team/injection

## 2019-12-25 NOTE — Telephone Encounter (Signed)
Submitted a Prior Authorization request to [employer] for [employer] via Cover My Meds. Will update once we receive a response.  ([employer]) - [id number]  For [employer] renewal, will need to complete new provider forms.  12:42 PM Beatriz Chancellor, CPhT

## 2019-12-26 NOTE — Telephone Encounter (Signed)
Filled out provider [place] forms, placed in [name]'s box for signature.  Will follow up.   Received notification from [facility] regarding a prior authorization for XOLAIR. Authorization has been APPROVED from 07/07/19 to 07/05/20.   Authorization # [id number] Phone # [phone]   Ran test claim for patient, copay for 1 month supply is $9.20

## 2019-12-26 NOTE — Telephone Encounter (Signed)
^  Copay is $9.20 for each fill. Based on Dosage- patient will need #4- 150mg  and #2- 75mg  syringes a month.

## 2020-01-01 ENCOUNTER — Ambulatory Visit: Payer: Medicare HMO

## 2020-01-01 MED ORDER — OMALIZUMAB 75 MG/0.5ML ~~LOC~~ SOSY: 75.0000 mg | PREFILLED_SYRINGE | SUBCUTANEOUS | 11 refills | Status: AC

## 2020-01-01 MED ORDER — OMALIZUMAB 150 MG/ML ~~LOC~~ SOSY: 300.0000 mg | PREFILLED_SYRINGE | SUBCUTANEOUS | 11 refills | Status: AC

## 2020-01-01 MED FILL — XOLAIR 75 MG/0.5ML SOSY: 75 | 28 days supply | Qty: 1 | Fill #0

## 2020-01-01 MED FILL — XOLAIR 150 MG/ML SOSY: 150 | 28 days supply | Qty: 4 | Fill #0

## 2020-01-01 NOTE — Telephone Encounter (Signed)
Spoke with Rachael, states that pt can get her medication from [place]. Pt might have 2 copays since she is on 2 different strengths of medication. Spoke with pt's granddaughter, Colletta [place]. She is okay with having to pay the small copay. Rx has been sent in. Pt has been scheduled for an injection on 01/03/2020 at 1400. Rachael is going to pick up the pt's medication tomorrow afternoon.

## 2020-01-01 NOTE — Telephone Encounter (Signed)
Spoke with Ria Comment, patient's [place] forms have not been returned, but if she is ok with copays we can fill patient's meds through [facility] this week.

## 2020-01-01 NOTE — Telephone Encounter (Signed)
Can you please advise where the pt's medication is to come from? Spoke with the pt's granddaughter this morning and she is highly upset that we do not have the pt's medication on hand in the office.

## 2020-01-02 NOTE — Telephone Encounter (Signed)
Medication has been received and placed in the fridge. If patient would like to try to reapply for [facility] patient assistance, we will need an updated patient consent. Will place form in injection room for patient's visit tomorrow.

## 2020-01-03 ENCOUNTER — Ambulatory Visit (INDEPENDENT_AMBULATORY_CARE_PROVIDER_SITE_OTHER): Payer: Medicare HMO

## 2020-01-03 ENCOUNTER — Other Ambulatory Visit: Payer: Self-pay

## 2020-01-03 DIAGNOSIS — J4550 Severe persistent asthma, uncomplicated: Secondary | ICD-10-CM

## 2020-01-03 MED ORDER — OMALIZUMAB 150 MG/ML ~~LOC~~ SOSY
300.0000 mg | PREFILLED_SYRINGE | Freq: Once | SUBCUTANEOUS | Status: AC
  Administered 2020-01-03: 300 mg via SUBCUTANEOUS

## 2020-01-03 MED ORDER — OMALIZUMAB 75 MG/0.5ML ~~LOC~~ SOSY
75.0000 mg | PREFILLED_SYRINGE | Freq: Once | SUBCUTANEOUS | Status: AC
  Administered 2020-01-03: 75 mg via SUBCUTANEOUS

## 2020-01-03 NOTE — Progress Notes (Addendum)
Pt came in today for restart of Xolair. She was accompanied by her great-granddaughter and an aide. Pt was on Xolair for years but fell off around when the pandemic started. Pt's great-granddaughter did not want to stay for the 2 hour wait. Stated, "She has an Epipen and I know what signs to look for if she has a reaction." Pt was monitored for 30 minutes before they left. In the 30 minutes after her injection, pt did not have any reactions. It was hard to determine if there was any skin changes at the injection sites due to the obscene amount of bruises the pt had on her arms. Advised pt's great-granddaughter to seek medical attention if she has to administer the pt's Epipen. She agreed and verbalized understanding.

## 2020-01-04 NOTE — Telephone Encounter (Signed)
Received signed patient consent form. Faxed application to [place]. Will update once we receive a response.   Phone- [phone]

## 2020-01-08 ENCOUNTER — Inpatient Hospital Stay (HOSPITAL_COMMUNITY)
Admission: EM | Admit: 2020-01-08 | Discharge: 2020-01-18 | DRG: 208 | Disposition: A | Discharge status: Hospice | Payer: Medicare HMO | Attending: Internal Medicine | Admitting: Internal Medicine

## 2020-01-08 ENCOUNTER — Emergency Department (HOSPITAL_COMMUNITY): Payer: Medicare HMO

## 2020-01-08 ENCOUNTER — Encounter (HOSPITAL_COMMUNITY): Payer: Self-pay

## 2020-01-08 ENCOUNTER — Other Ambulatory Visit: Payer: Self-pay

## 2020-01-08 DIAGNOSIS — R8271 Bacteriuria: Secondary | ICD-10-CM | POA: Diagnosis present

## 2020-01-08 DIAGNOSIS — E861 Hypovolemia: Secondary | ICD-10-CM | POA: Diagnosis present

## 2020-01-08 DIAGNOSIS — D649 Anemia, unspecified: Secondary | ICD-10-CM | POA: Diagnosis present

## 2020-01-08 DIAGNOSIS — Z01818 Encounter for other preprocedural examination: Secondary | ICD-10-CM

## 2020-01-08 DIAGNOSIS — Z20822 Contact with and (suspected) exposure to covid-19: Secondary | ICD-10-CM | POA: Diagnosis present

## 2020-01-08 DIAGNOSIS — Z91041 Radiographic dye allergy status: Secondary | ICD-10-CM

## 2020-01-08 DIAGNOSIS — E871 Hypo-osmolality and hyponatremia: Secondary | ICD-10-CM | POA: Diagnosis present

## 2020-01-08 DIAGNOSIS — J9819 Other pulmonary collapse: Secondary | ICD-10-CM | POA: Diagnosis present

## 2020-01-08 DIAGNOSIS — I712 Thoracic aortic aneurysm, without rupture: Secondary | ICD-10-CM | POA: Diagnosis present

## 2020-01-08 DIAGNOSIS — J15211 Pneumonia due to Methicillin susceptible Staphylococcus aureus: Secondary | ICD-10-CM | POA: Diagnosis present

## 2020-01-08 DIAGNOSIS — I4891 Unspecified atrial fibrillation: Secondary | ICD-10-CM

## 2020-01-08 DIAGNOSIS — I10 Essential (primary) hypertension: Secondary | ICD-10-CM | POA: Diagnosis present

## 2020-01-08 DIAGNOSIS — Z89511 Acquired absence of right leg below knee: Secondary | ICD-10-CM

## 2020-01-08 DIAGNOSIS — E860 Dehydration: Secondary | ICD-10-CM | POA: Diagnosis present

## 2020-01-08 DIAGNOSIS — J189 Pneumonia, unspecified organism: Secondary | ICD-10-CM

## 2020-01-08 DIAGNOSIS — R54 Age-related physical debility: Secondary | ICD-10-CM | POA: Diagnosis present

## 2020-01-08 DIAGNOSIS — D696 Thrombocytopenia, unspecified: Secondary | ICD-10-CM | POA: Diagnosis present

## 2020-01-08 DIAGNOSIS — J449 Chronic obstructive pulmonary disease, unspecified: Secondary | ICD-10-CM | POA: Diagnosis present

## 2020-01-08 DIAGNOSIS — H409 Unspecified glaucoma: Secondary | ICD-10-CM | POA: Diagnosis present

## 2020-01-08 DIAGNOSIS — K219 Gastro-esophageal reflux disease without esophagitis: Secondary | ICD-10-CM | POA: Diagnosis present

## 2020-01-08 DIAGNOSIS — R791 Abnormal coagulation profile: Secondary | ICD-10-CM | POA: Diagnosis present

## 2020-01-08 DIAGNOSIS — F419 Anxiety disorder, unspecified: Secondary | ICD-10-CM | POA: Diagnosis present

## 2020-01-08 DIAGNOSIS — Z85820 Personal history of malignant melanoma of skin: Secondary | ICD-10-CM

## 2020-01-08 DIAGNOSIS — Z66 Do not resuscitate: Secondary | ICD-10-CM | POA: Diagnosis not present

## 2020-01-08 DIAGNOSIS — Z87891 Personal history of nicotine dependence: Secondary | ICD-10-CM

## 2020-01-08 DIAGNOSIS — R6889 Other general symptoms and signs: Secondary | ICD-10-CM

## 2020-01-08 DIAGNOSIS — L89152 Pressure ulcer of sacral region, stage 2: Secondary | ICD-10-CM | POA: Diagnosis present

## 2020-01-08 DIAGNOSIS — Z7901 Long term (current) use of anticoagulants: Secondary | ICD-10-CM

## 2020-01-08 DIAGNOSIS — Z882 Allergy status to sulfonamides status: Secondary | ICD-10-CM

## 2020-01-08 DIAGNOSIS — J440 Chronic obstructive pulmonary disease with acute lower respiratory infection: Secondary | ICD-10-CM | POA: Diagnosis present

## 2020-01-08 DIAGNOSIS — I4819 Other persistent atrial fibrillation: Secondary | ICD-10-CM | POA: Diagnosis present

## 2020-01-08 DIAGNOSIS — Z9689 Presence of other specified functional implants: Secondary | ICD-10-CM

## 2020-01-08 DIAGNOSIS — F0390 Unspecified dementia without behavioral disturbance: Secondary | ICD-10-CM | POA: Diagnosis present

## 2020-01-08 DIAGNOSIS — Z880 Allergy status to penicillin: Secondary | ICD-10-CM

## 2020-01-08 DIAGNOSIS — J939 Pneumothorax, unspecified: Secondary | ICD-10-CM | POA: Diagnosis present

## 2020-01-08 DIAGNOSIS — Z681 Body mass index (BMI) 19 or less, adult: Secondary | ICD-10-CM

## 2020-01-08 DIAGNOSIS — Z79899 Other long term (current) drug therapy: Secondary | ICD-10-CM

## 2020-01-08 DIAGNOSIS — Z7951 Long term (current) use of inhaled steroids: Secondary | ICD-10-CM

## 2020-01-08 DIAGNOSIS — M1990 Unspecified osteoarthritis, unspecified site: Secondary | ICD-10-CM | POA: Diagnosis present

## 2020-01-08 DIAGNOSIS — Z888 Allergy status to other drugs, medicaments and biological substances status: Secondary | ICD-10-CM

## 2020-01-08 DIAGNOSIS — Z515 Encounter for palliative care: Secondary | ICD-10-CM | POA: Diagnosis not present

## 2020-01-08 DIAGNOSIS — Z8249 Family history of ischemic heart disease and other diseases of the circulatory system: Secondary | ICD-10-CM

## 2020-01-08 DIAGNOSIS — J690 Pneumonitis due to inhalation of food and vomit: Secondary | ICD-10-CM | POA: Diagnosis not present

## 2020-01-08 DIAGNOSIS — G9349 Other encephalopathy: Secondary | ICD-10-CM | POA: Diagnosis present

## 2020-01-08 DIAGNOSIS — E8770 Fluid overload, unspecified: Secondary | ICD-10-CM | POA: Diagnosis present

## 2020-01-08 DIAGNOSIS — E785 Hyperlipidemia, unspecified: Secondary | ICD-10-CM | POA: Diagnosis present

## 2020-01-08 DIAGNOSIS — Z978 Presence of other specified devices: Secondary | ICD-10-CM

## 2020-01-08 DIAGNOSIS — D721 Eosinophilia, unspecified: Secondary | ICD-10-CM | POA: Diagnosis present

## 2020-01-08 DIAGNOSIS — Z4659 Encounter for fitting and adjustment of other gastrointestinal appliance and device: Secondary | ICD-10-CM

## 2020-01-08 DIAGNOSIS — R1310 Dysphagia, unspecified: Secondary | ICD-10-CM | POA: Diagnosis present

## 2020-01-08 DIAGNOSIS — Z7189 Other specified counseling: Secondary | ICD-10-CM

## 2020-01-08 DIAGNOSIS — J441 Chronic obstructive pulmonary disease with (acute) exacerbation: Secondary | ICD-10-CM | POA: Diagnosis present

## 2020-01-08 DIAGNOSIS — E43 Unspecified severe protein-calorie malnutrition: Secondary | ICD-10-CM | POA: Diagnosis present

## 2020-01-08 DIAGNOSIS — I700 Atherosclerosis of aorta: Secondary | ICD-10-CM | POA: Diagnosis present

## 2020-01-08 DIAGNOSIS — Z8673 Personal history of transient ischemic attack (TIA), and cerebral infarction without residual deficits: Secondary | ICD-10-CM

## 2020-01-08 DIAGNOSIS — Z955 Presence of coronary angioplasty implant and graft: Secondary | ICD-10-CM

## 2020-01-08 DIAGNOSIS — Z9889 Other specified postprocedural states: Secondary | ICD-10-CM

## 2020-01-08 DIAGNOSIS — I2510 Atherosclerotic heart disease of native coronary artery without angina pectoris: Secondary | ICD-10-CM | POA: Diagnosis present

## 2020-01-08 DIAGNOSIS — Z881 Allergy status to other antibiotic agents status: Secondary | ICD-10-CM

## 2020-01-08 DIAGNOSIS — Z90710 Acquired absence of both cervix and uterus: Secondary | ICD-10-CM

## 2020-01-08 DIAGNOSIS — Z886 Allergy status to analgesic agent status: Secondary | ICD-10-CM

## 2020-01-08 DIAGNOSIS — R0602 Shortness of breath: Secondary | ICD-10-CM

## 2020-01-08 DIAGNOSIS — J45901 Unspecified asthma with (acute) exacerbation: Secondary | ICD-10-CM | POA: Diagnosis present

## 2020-01-08 DIAGNOSIS — J90 Pleural effusion, not elsewhere classified: Secondary | ICD-10-CM | POA: Diagnosis present

## 2020-01-08 DIAGNOSIS — J9811 Atelectasis: Secondary | ICD-10-CM | POA: Diagnosis present

## 2020-01-08 DIAGNOSIS — G629 Polyneuropathy, unspecified: Secondary | ICD-10-CM | POA: Diagnosis present

## 2020-01-08 DIAGNOSIS — J9601 Acute respiratory failure with hypoxia: Secondary | ICD-10-CM | POA: Diagnosis present

## 2020-01-08 HISTORY — DX: Unspecified asthma, uncomplicated: J45.909

## 2020-01-08 LAB — COMPREHENSIVE METABOLIC PANEL
ALT: 17 U/L (ref 0–44)
AST: 25 U/L (ref 15–41)
Albumin: 3.2 g/dL — ABNORMAL LOW (ref 3.5–5.0)
Alkaline Phosphatase: 46 U/L (ref 38–126)
Anion gap: 8 (ref 5–15)
BUN: 14 mg/dL (ref 8–23)
CO2: 25 mmol/L (ref 22–32)
Calcium: 8.4 mg/dL — ABNORMAL LOW (ref 8.9–10.3)
Chloride: 93 mmol/L — ABNORMAL LOW (ref 98–111)
Creatinine, Ser: 0.77 mg/dL (ref 0.44–1.00)
GFR calc Af Amer: >60 mL/min (ref >60)
GFR calc non Af Amer: >60 mL/min (ref >60)
Glucose, Bld: 115 mg/dL — ABNORMAL HIGH (ref 70–99)
Potassium: 4.1 mmol/L (ref 3.5–5.1)
Sodium: 126 mmol/L — ABNORMAL LOW (ref 135–145)
Total Bilirubin: 0.9 mg/dL (ref 0.3–1.2)
Total Protein: 6.2 g/dL — ABNORMAL LOW (ref 6.5–8.1)

## 2020-01-08 LAB — I-STAT VENOUS BLOOD GAS, ED
Acid-Base Excess: 4 mmol/L — ABNORMAL HIGH (ref 0.0–2.0)
Bicarbonate: 28.5 mmol/L — ABNORMAL HIGH (ref 20.0–28.0)
Calcium, Ion: 1.13 mmol/L — ABNORMAL LOW (ref 1.15–1.40)
HCT: 24 % — ABNORMAL LOW (ref 36.0–46.0)
Hemoglobin: 8.2 g/dL — ABNORMAL LOW (ref 12.0–15.0)
O2 Saturation: 86 %
Potassium: 4.3 mmol/L (ref 3.5–5.1)
Sodium: 131 mmol/L — ABNORMAL LOW (ref 135–145)
TCO2: 30 mmol/L (ref 22–32)
pCO2, Ven: 41.7 mmHg — ABNORMAL LOW (ref 44.0–60.0)
pH, Ven: 7.442 — ABNORMAL HIGH (ref 7.250–7.430)
pO2, Ven: 50 mmHg — ABNORMAL HIGH (ref 32.0–45.0)

## 2020-01-08 LAB — CBC WITH DIFFERENTIAL/PLATELET
Abs Immature Granulocytes: 0.02 10*3/uL (ref 0.00–0.07)
Basophils Absolute: 0 10*3/uL (ref 0.0–0.1)
Basophils Relative: 0 %
Eosinophils Absolute: 0 10*3/uL (ref 0.0–0.5)
Eosinophils Relative: 1 %
HCT: 22.9 % — ABNORMAL LOW (ref 36.0–46.0)
Hemoglobin: 7.1 g/dL — ABNORMAL LOW (ref 12.0–15.0)
Immature Granulocytes: 0 %
Lymphocytes Relative: 12 %
Lymphs Abs: 0.8 10*3/uL (ref 0.7–4.0)
MCH: 30 pg (ref 26.0–34.0)
MCHC: 31 g/dL (ref 30.0–36.0)
MCV: 96.6 fL (ref 80.0–100.0)
Monocytes Absolute: 0.5 10*3/uL (ref 0.1–1.0)
Monocytes Relative: 8 %
Neutro Abs: 4.9 10*3/uL (ref 1.7–7.7)
Neutrophils Relative %: 79 %
Platelets: 163 10*3/uL (ref 150–400)
RBC: 2.37 MIL/uL — ABNORMAL LOW (ref 3.87–5.11)
RDW: 16.9 % — ABNORMAL HIGH (ref 11.5–15.5)
WBC: 6.2 10*3/uL (ref 4.0–10.5)
nRBC: 0 % (ref 0.0–0.2)

## 2020-01-08 LAB — PROTIME-INR
INR: 3.6 — ABNORMAL HIGH (ref 0.8–1.2)
Prothrombin Time: 34.5 seconds — ABNORMAL HIGH (ref 11.4–15.2)

## 2020-01-08 LAB — BRAIN NATRIURETIC PEPTIDE: B Natriuretic Peptide: 303.2 pg/mL — ABNORMAL HIGH (ref 0.0–100.0)

## 2020-01-08 LAB — PREPARE RBC (CROSSMATCH)

## 2020-01-08 LAB — SARS CORONAVIRUS 2 BY RT PCR (HOSPITAL ORDER, PERFORMED IN ~~LOC~~ HOSPITAL LAB): SARS Coronavirus 2: NEGATIVE

## 2020-01-08 LAB — TROPONIN I (HIGH SENSITIVITY)
Troponin I (High Sensitivity): 5 ng/L (ref <18)
Troponin I (High Sensitivity): 5 ng/L (ref <18)

## 2020-01-08 LAB — POC OCCULT BLOOD, ED: Fecal Occult Bld: NEGATIVE

## 2020-01-08 MED ORDER — SENNOSIDES-DOCUSATE SODIUM 8.6-50 MG PO TABS: 1.0000 | ORAL_TABLET | Freq: Every evening | ORAL | Status: DC | PRN

## 2020-01-08 MED ORDER — ALPRAZOLAM 0.25 MG PO TABS: 0.5000 mg | ORAL_TABLET | Freq: Two times a day (BID) | ORAL | Status: DC | PRN

## 2020-01-08 MED ORDER — METOPROLOL TARTRATE 25 MG PO TABS: 25.0000 mg | ORAL_TABLET | Freq: Once | ORAL | Status: DC

## 2020-01-08 MED ORDER — SODIUM CHLORIDE 0.9% FLUSH
3.0000 mL | Freq: Two times a day (BID) | INTRAVENOUS | Status: DC
  Administered 2020-01-09 – 2020-01-17 (×15): 3 mL via INTRAVENOUS

## 2020-01-08 MED ORDER — ONDANSETRON HCL 4 MG PO TABS: 4.0000 mg | ORAL_TABLET | Freq: Four times a day (QID) | ORAL | Status: DC | PRN

## 2020-01-08 MED ORDER — ONDANSETRON HCL 4 MG/2ML IJ SOLN
4.0000 mg | Freq: Four times a day (QID) | INTRAMUSCULAR | Status: DC | PRN
  Administered 2020-01-16: 4 mg via INTRAVENOUS
  Filled 2020-01-08: qty 2

## 2020-01-08 MED ORDER — ACETAMINOPHEN 650 MG RE SUPP: 650.0000 mg | Freq: Four times a day (QID) | RECTAL | Status: DC | PRN

## 2020-01-08 MED ORDER — SODIUM CHLORIDE 0.9 % IV SOLN
10.0000 mL/h | Freq: Once | INTRAVENOUS | Status: AC
  Administered 2020-01-08: 10 mL/h via INTRAVENOUS

## 2020-01-08 MED ORDER — ALBUTEROL SULFATE HFA 108 (90 BASE) MCG/ACT IN AERS
2.0000 | INHALATION_SPRAY | Freq: Once | RESPIRATORY_TRACT | Status: AC
  Administered 2020-01-08: 2 via RESPIRATORY_TRACT
  Filled 2020-01-08: qty 6.7

## 2020-01-08 MED ORDER — ALPRAZOLAM 0.5 MG PO TABS
0.5000 mg | ORAL_TABLET | Freq: Every evening | ORAL | Status: DC | PRN
  Administered 2020-01-09 – 2020-01-10 (×3): 0.5 mg via ORAL
  Filled 2020-01-08: qty 1
  Filled 2020-01-08 (×2): qty 2

## 2020-01-08 MED ORDER — ACETAMINOPHEN 325 MG PO TABS: 650.0000 mg | ORAL_TABLET | Freq: Four times a day (QID) | ORAL | Status: DC | PRN

## 2020-01-08 MED ORDER — ALBUTEROL SULFATE HFA 108 (90 BASE) MCG/ACT IN AERS: 2.0000 | INHALATION_SPRAY | RESPIRATORY_TRACT | Status: DC | PRN

## 2020-01-08 MED ORDER — SODIUM CHLORIDE 0.9 % IV BOLUS
500.0000 mL | Freq: Once | INTRAVENOUS | Status: AC
  Administered 2020-01-08: 500 mL via INTRAVENOUS

## 2020-01-08 MED ORDER — ALBUTEROL SULFATE (2.5 MG/3ML) 0.083% IN NEBU
2.5000 mg | INHALATION_SOLUTION | RESPIRATORY_TRACT | Status: DC | PRN
  Administered 2020-01-09 – 2020-01-16 (×5): 2.5 mg via RESPIRATORY_TRACT
  Filled 2020-01-08 (×5): qty 3

## 2020-01-08 MED ORDER — MONTELUKAST SODIUM 10 MG PO TABS
10.0000 mg | ORAL_TABLET | Freq: Every day | ORAL | Status: DC
  Administered 2020-01-08 – 2020-01-12 (×5): 10 mg via ORAL
  Filled 2020-01-08 (×7): qty 1

## 2020-01-08 MED ORDER — FLUTICASONE FUROATE-VILANTEROL 200-25 MCG/INH IN AEPB: 1.0000 | INHALATION_SPRAY | Freq: Every day | RESPIRATORY_TRACT | Status: DC

## 2020-01-08 NOTE — ED Triage Notes (Signed)
From home via ems; c/o sob that been getting worse since last night; hx copd, CHF, asthma; normally doesn't require o2; home health care giving  breathing treatments with some relief, but not as much relief as she usually gets; lungs clear on ems arrival; 4-5 minutes pta, some wheezing developing; 90% RA, 97% 2L, Afib 100-130, hx of same; EKG showed global T wave inversion; wound to both forearms, chronic; some LE edema, worse than normal; R leg BKA; a and o x 4; denies pain; endorses some productive cough with clear to white sputum; pt has received covid vaccine;    Hr 117 97% 2L RR 20 CBG 145 T 98.84F

## 2020-01-08 NOTE — ED Notes (Signed)
Pt transported to CT ?

## 2020-01-08 NOTE — ED Provider Notes (Signed)
Emergency Department Provider Note   I have reviewed the triage vital signs and the nursing notes.   HISTORY  Chief Complaint Shortness of Breath   HPI [name] is a 86 y.o. female with PMH reviewed below including A-fib on Coumadin, Asthma, and recent right BKA in April 2021 presents to the ED with SOB and confusion worsening over the last 2 days.  Patient arrives by EMS.  They report they arrived on scene and found the patient dyspneic after recently doing a home albuterol nebulizer treatment.  She reported some mild improvement but remained short of breath and had borderline hypoxemia with some wheezing.  She was started on 2 L nasal cannula and transported to the emergency department.  Patient also noted to be in atrial fibrillation with RVR.   The great-ganddaughter arrived to bedside shortly after her arrival and states that she cares for the patient along with home health and other family.  Patient essentially has 24-hour care at home.  They recently held the patient's metoprolol with some low blood pressures 2 weeks ago but since holding this medicine her blood pressures have stabilized.  She has not been on any new medicines.  No fevers or chills.  The daughter noted the shortness of breath but states that patient has also been especially confused over the past several days.  She has been using her albuterol inhalers incessantly and even hiding them in her diapers that she can use them even after the great-granddaughter takes them away.  Family went to the grocery store per their normal routine this weekend and while they were gone the patient made 5-6 different phone calls alerting that they had been abandoned.  She apparently attempted to call the police but was not successful.  This behavior is very unusual per family.   Patient denies any pain. No vomiting or diarrhea. Notes chronic a-fib sensation which is unchanged.    Past Medical History:  Diagnosis Date    Anxiety    Arthritis    Asthma    CAD (coronary artery disease) 2007   a. 1997 s/p PCI of diagonal.   Constipation    COPD/Asthma    Diverticulosis    Esophageal reflux    Family history of adverse reaction to anesthesia    daughter had trouble breathing after surgery, had to be reintubated   Glaucoma    HTN (hypertension), benign    Hyperlipidemia    Malignant melanoma ([place])    a. 09/2012 s/p resection.   Neuropathy    lower legs   PAF (paroxysmal atrial fibrillation) (HCC)    a. intolerant to beta blockers due to asthma and intolerant to CCB due to rash;  b. CHA2DS2VASc = 6-->chronic coumadin;  c. 03/2014 Echo: EF 60-65%, Gr 1 DD, triv AI, mildly dil RV, PASP 54mmhg.   TIA (transient ischemic attack)    a. 02/2011 - chronic coumadin in setting of PAF.   Urinary, incontinence, stress female     Patient Active Problem List   Diagnosis Date Noted   Acute osteomyelitis of right foot ([place]) 10/21/2019   Macrocytic anemia 10/21/2019   AKI (acute kidney injury) ([place]) 10/21/2019   SIRS (systemic inflammatory response syndrome) ([place]) 10/21/2019   Transient hypotension 10/21/2019   Dysphagia 10/21/2019   AAA (abdominal aortic aneurysm) ([place]) 10/21/2019   Pressure injury of skin 08/09/2019   Cellulitis 08/08/2019   Elevated IgE level 07/27/2019   Healthcare maintenance 07/27/2019   Palpitation 01/28/2019   Atrial fibrillation  with RVR ([place]) 01/28/2019   Pain in left knee 11/24/2018   Pain in right knee 11/24/2018   Closed fracture of distal end of left radius 08/29/2018   On continuous oral anticoagulation    Hip fracture ([place]) 04/19/2017   Closed left hip fracture, initial encounter ([place]) 04/18/2017   Ulcer of toe of left foot, with necrosis of bone ([place]) 10/08/2016   Non-pressure chronic ulcer of other part of left foot limited to breakdown of skin ([place]) 09/24/2016   Acute asthma exacerbation 09/21/2016   Allergic rhinitis 09/21/2016    Anxiety 09/21/2016   Arthritis 09/21/2016   Breath shortness 09/21/2016   Glaucoma 09/21/2016   Adult hypothyroidism 09/21/2016   Unilateral primary osteoarthritis, right knee 08/27/2016   Bunion of great toe of right foot 06/12/2016   Acquired hammer toe of left foot    Irregular bowel habits 10/25/2014   Cerumen impaction 09/26/2014   Essential hypertension 03/22/2014   Wrist laceration 03/01/2014   Primary open angle glaucoma of left eye, moderate stage 11/16/2013   Primary open angle glaucoma of right eye, mild stage 11/16/2013   Persistent atrial fibrillation (HCC)    Hyperlipidemia LDL goal <70    Donisha Hoch term current use of anticoagulant therapy 09/24/2012   CAD (coronary artery disease) 09/24/2012   Transient cerebral ischemia 03/05/2011   WEIGHT LOSS 08/23/2009   CHANGE IN BOWELS 08/23/2009   COLONIC POLYPS, ADENOMATOUS, HX OF 08/23/2009   Anxiety state 07/02/2009   Allergic rhinitis due to pollen 11/13/2008   Major depressive disorder, single episode, moderate ([place]) 05/14/2008   Sinus infection 07/13/2007   Asthma-COPD overlap syndrome ([place]) 06/15/2007    Past Surgical History:  Procedure Laterality Date   ABDOMINAL HYSTERECTOMY     AMPUTATION Right 10/26/2019   Procedure: AMPUTATION BELOW KNEE;  Surgeon: Erle Crocker, MD;  Location: [facility];  Service: Orthopedics;  Laterality: Right;   APPENDECTOMY     BACK SURGERY     lumbar laminectomy   CHOLECYSTECTOMY     COLONOSCOPY     CORONARY ANGIOPLASTY WITH STENT PLACEMENT     EYE SURGERY     NASAL SINUS SURGERY     VESICOVAGINAL FISTULA CLOSURE W/ TAH     WEIL OSTEOTOMY Left 06/12/2016   Procedure: Left [place] and [place] Left [name], Weil Osteotomy 2nd and 3rd Metatarsal;  Surgeon: Newt Minion, MD;  Location: [facility];  Service: Orthopedics;  Laterality: Left;    Allergies Alphagan [brimonidine], Apraclonidine, Biaxin [clarithromycin], Brovana [arformoterol], Budesonide,  Cefdinir, Cephalexin, Cifenline, Clonidine, Clonidine derivatives, Dorzolamide, Iodinated diagnostic agents, Iohexol, Nsaids, Penicillins, Sulfamethoxazole, Sulfonamide derivatives, Tetracycline, Timolol, Vancomycin, Zetia [ezetimibe], and Cardizem [diltiazem hcl]  Family History  Problem Relation Age of Onset   Heart disease Father    Hemachromatosis Father    Heart attack Father    Colon cancer Mother    Peripheral vascular disease Mother    Peripheral vascular disease Sister    Cancer Sister    Lymphoma Sister    Heart attack Brother    Heart disease Brother     Social History Social History   Tobacco Use   Smoking status: Former Smoker    Packs/day: 0.20    Years: 25.00    Pack years: 5.00    Types: Cigarettes    Quit date: 07/06/1966    Years since quitting: 53.5   Smokeless tobacco: Never Used  Vaping Use   Vaping Use: Never used  Substance Use Topics   Alcohol use: No  Alcohol/week: 0.0 standard drinks   Drug use: No    Review of Systems  Constitutional: No fever/chills Eyes: No visual changes. ENT: No sore throat. Cardiovascular: Denies chest pain. Positive palpitations (chronic).  Respiratory: Positive shortness of breath. Gastrointestinal: No abdominal pain.  No nausea, no vomiting.  No diarrhea.  No constipation. Genitourinary: Negative for dysuria. Musculoskeletal: Negative for back pain. Skin: Negative for rash. Neurological: Negative for headaches, focal weakness or numbness.  10-point ROS otherwise negative.  ____________________________________________   PHYSICAL EXAM:  VITAL SIGNS: ED Triage Vitals  Enc Vitals Group     BP 01/08/20 1641 116/65     Pulse Rate 01/08/20 1645 99     Resp 01/08/20 1643 (!) 27     Temp 01/08/20 1644 97.8 F (36.6 C)     Temp Source 01/08/20 1644 Oral     SpO2 01/08/20 1637 97 %     Weight 01/08/20 1639 115 lb (52.2 kg)     Height 01/08/20 1639 5\' 6"  (1.676 m)   Constitutional: Alert and  oriented to person and place. Confused regarding the year. Well appearing and in no acute distress. Eyes: Conjunctivae are normal. PERRL.  Head: Atraumatic. Nose: No congestion/rhinnorhea. Mouth/Throat: Mucous membranes are moist.  Neck: No stridor. Cardiovascular: A-fib with RVR. Good peripheral circulation. Grossly normal heart sounds.   Respiratory: Normal respiratory effort.  No retractions. Lungs relatively clear with minimal end-expiratory wheezing bilaterally.  Gastrointestinal: Soft and nontender. No distention. Rectal exam performed with patient's verbal consent and nurse chaperone. No gross blood or melena.  Musculoskeletal: Well-appearing right BKA with trace edema.  Neurologic:  Normal speech and language. No gross focal neurologic deficits are appreciated. 5/5 strength in the upper and lower extremities.  Skin:  Skin is warm and dry. Eschar over the bilateral forearms without laceration, active bleeding, or cellulitis. Stage 2 decubitus wound without evidence of infection.   ____________________________________________   LABS (all labs ordered are listed, but only abnormal results are displayed)  Labs Reviewed  COMPREHENSIVE METABOLIC PANEL - Abnormal; Notable for the following components:      Result Value   Sodium 126 (*)    Chloride 93 (*)    Glucose, Bld 115 (*)    Calcium 8.4 (*)    Total Protein 6.2 (*)    Albumin 3.2 (*)    All other components within normal limits  BRAIN NATRIURETIC PEPTIDE - Abnormal; Notable for the following components:   B Natriuretic Peptide 303.2 (*)    All other components within normal limits  CBC WITH DIFFERENTIAL/PLATELET - Abnormal; Notable for the following components:   RBC 2.37 (*)    Hemoglobin 7.1 (*)    HCT 22.9 (*)    RDW 16.9 (*)    All other components within normal limits  PROTIME-INR - Abnormal; Notable for the following components:   Prothrombin Time 34.5 (*)    INR 3.6 (*)    All other components within normal  limits  I-STAT VENOUS BLOOD GAS, ED - Abnormal; Notable for the following components:   pH, Ven 7.442 (*)    pCO2, Ven 41.7 (*)    pO2, Ven 50.0 (*)    Bicarbonate 28.5 (*)    Acid-Base Excess 4.0 (*)    Sodium 131 (*)    Calcium, Ion 1.13 (*)    HCT 24.0 (*)    Hemoglobin 8.2 (*)    All other components within normal limits  SARS CORONAVIRUS 2 BY RT PCR (HOSPITAL ORDER, [place]  [facility])  URINALYSIS, ROUTINE W REFLEX MICROSCOPIC  OSMOLALITY, URINE  SODIUM, URINE, RANDOM  OSMOLALITY  FERRITIN  IRON AND TIBC  POC OCCULT BLOOD, ED  TYPE AND SCREEN  PREPARE RBC (CROSSMATCH)  TROPONIN I (HIGH SENSITIVITY)  TROPONIN I (HIGH SENSITIVITY)   ____________________________________________  EKG   EKG Interpretation  Date/Time:  Monday January 08 2020 17:06:32 EDT Ventricular Rate:  114 PR Interval:    QRS Duration: 84 QT Interval:  396 QTC Calculation: 546 R Axis:   89 Text Interpretation: Atrial fibrillation Borderline right axis deviation Low voltage, extremity leads Nonspecific T abnormalities, lateral leads No STEMI Confirmed by Nanda Quinton [phone]) on 01/08/2020 5:13:44 PM       ____________________________________________  RADIOLOGY  CT Head Wo Contrast  Result Date: 01/08/2020 CLINICAL DATA:  86 year old female with encephalopathy. EXAM: CT HEAD WITHOUT CONTRAST TECHNIQUE: Contiguous axial images were obtained from the base of the skull through the vertex without intravenous contrast. COMPARISON:  Head CT dated 03/16/2014. FINDINGS: Brain: There is mild to moderate age-related atrophy and chronic microvascular ischemic changes. There is no acute intracranial hemorrhage. No mass effect or midline shift. No extra-axial fluid collection. Vascular: No hyperdense vessel or unexpected calcification. Skull: Normal. Negative for fracture or focal lesion. Sinuses/Orbits: No acute finding. Other: None IMPRESSION: 1. No acute intracranial pathology. 2. Age-related  atrophy and chronic microvascular ischemic changes. Electronically Signed   By: Anner Crete M.D.   On: 01/08/2020 17:38   DG Chest Portable 1 View  Result Date: 01/08/2020 CLINICAL DATA:  Shortness of breath. EXAM: PORTABLE CHEST 1 VIEW COMPARISON:  October 21, 2019 FINDINGS: Mild atelectasis and/or infiltrate is seen within the right lung base with moderate severity left basilar atelectasis and/or infiltrate. Small bilateral pleural effusions are seen, left greater than right. No pneumothorax is identified. The cardiac silhouette is mildly enlarged. There is moderate severity calcification of the aortic arch. Degenerative changes seen throughout the thoracic spine. IMPRESSION: 1. Mild right basilar atelectasis and/or infiltrate with moderate severity left basilar atelectasis and/or infiltrate. 2. Small bilateral pleural effusions, left greater than right. Electronically Signed   By: Virgina Norfolk M.D.   On: 01/08/2020 17:15    ____________________________________________   PROCEDURES  Procedure(s) performed:   Procedures  CRITICAL CARE Performed by: Margette Fast Total critical care time: 35 minutes Critical care time was exclusive of separately billable procedures and treating other patients. Critical care was necessary to treat or prevent imminent or life-threatening deterioration. Critical care was time spent personally by me on the following activities: development of treatment plan with patient and/or surrogate as well as nursing, discussions with consultants, evaluation of patient's response to treatment, examination of patient, obtaining history from patient or surrogate, ordering and performing treatments and interventions, ordering and review of laboratory studies, ordering and review of radiographic studies, pulse oximetry and re-evaluation of patient's condition.  Nanda Quinton, MD Emergency Medicine  ____________________________________________   INITIAL IMPRESSION /  ASSESSMENT AND PLAN / ED COURSE  Pertinent labs & imaging results that were available during my care of the patient were reviewed by me and considered in my medical decision making (see chart for details).   Patient presents to the ED with shortness of breath and some acute worsening confusion at home.  Patient has no focal neurologic deficits.  She has had recent ED visits with supratherapeutic INR.  No outward sign of head trauma.  Patient has healing eschar over the bilateral forearms with no active bleeding or surrounding cellulitis.  Patient also with stage II pressure ulcer in the sacral region.  Plan for CT imaging of the head given encephalopathy type presentation.  Plan for chest x-ray along with Covid swab and albuterol inhaler.   05:50 PM  Labs resulting with hemoglobin of 7.1 down from 10.1 on May 4th.  Prior to this patient has had hemoglobins in the high eights.  Question of her shortness of breath and possibly her encephalopathy could be more related to symptomatic anemia.  Rectal exam but no gross blood or melena.  Hemoccult pending.  Consented patient to receive 1 unit PRBC transfusion and she is in agreement.  Chest x-ray reviewed.  Favor atelectasis on these films rather than infiltrate.  Patient has no fevers at home with frequent checking by family.  No subjective fever symptoms.  No leukocytosis.  No fever here. SOB may be related to symptomatic anemia. Patient also with mild hyponatremia to 126. Plan for PRBC transfusion and admit.   Discussed patient's case with IM service to request admission. Patient and family (if present) updated with plan. Care transferred to IM service.  I reviewed all nursing notes, vitals, pertinent old records, EKGs, labs, imaging (as available).  ____________________________________________  FINAL CLINICAL IMPRESSION(S) / ED DIAGNOSES  Final diagnoses:  Symptomatic anemia  Hyponatremia  SOB (shortness of breath)     MEDICATIONS GIVEN DURING  THIS VISIT:  Medications  metoprolol tartrate (LOPRESSOR) tablet 25 mg (0 mg Oral Hold 01/08/20 1719)  ALPRAZolam (XANAX) tablet 0.5 mg (has no administration in time range)  fluticasone furoate-vilanterol (BREO ELLIPTA) 200-25 MCG/INH 1 puff (1 puff Inhalation Not Given 01/08/20 2116)  montelukast (SINGULAIR) tablet 10 mg (10 mg Oral Given 01/08/20 2211)  albuterol (VENTOLIN HFA) 108 (90 Base) MCG/ACT inhaler 2 puff (has no administration in time range)  albuterol (PROVENTIL) (2.5 MG/3ML) 0.083% nebulizer solution 2.5 mg (has no administration in time range)  albuterol (VENTOLIN HFA) 108 (90 Base) MCG/ACT inhaler 2 puff (2 puffs Inhalation Given 01/08/20 1715)  sodium chloride 0.9 % bolus 500 mL (0 mLs Intravenous Stopped 01/08/20 1857)  0.9 %  sodium chloride infusion (0 mL/hr Intravenous Stopped 01/08/20 2212)    Note:  This document was prepared using Dragon voice recognition software and may include unintentional dictation errors.  Nanda Quinton, MD, [facility] Emergency Medicine    Amauri Medellin, [facility], MD 01/08/20 [phone]

## 2020-01-08 NOTE — H&P (Signed)
Date: 01/09/2020               Patient Name:  [name] MRN: [id number]  DOB: September 21, 1923 Age / Sex: 86 y.o., female   PCP: [name], [name]         Medical Service: Internal Medicine Teaching Service         Attending Physician: Dr. Velna Ochs, MD    First Contact: Dr. Virl Axe, MD Pager: [phone]  Second Contact: Dr. Lonia Skinner, MD Pager: [phone]       After Hours (After 5p/  First Contact Pager: [phone]  weekends / holidays): Second Contact Pager: [phone]   Chief Complaint: SOB  History of Present Illness: [name] is 58yoF w/ Afib (on Coumadin), HTN, asthma, CAD, and hx of osteomyelitis s/p R BKA (10/26/2019) who presents to ED via EMS with SOB, weakness. Great-granddaughter in room provided majority of history.  Patient noted SOB and generalized weakness for past week, but worsening over last few days. She does not need supplemental oxygen at baseline. Denies fevers, chills, myalgias, hemoptysis. Reports anti-coagulation taken appropriately, last dose of Coumadin this AM. Denies CP, abdominal pain, n/v/d, hematochezia. In addition, patient has experienced gradual cognitive decline over past year or so. Family says patient has acutely worsened over the last week. Reportedly has attempted to call police on family and repeatedly hiding her inhalers and suggesting family is trying to kill her.  Upon arrival via EMS, afebrile, tachycardic, tachypneic, sating 97% on 2L [name]. Labs notable for anemia, no leukocytosis, elevated INR. FOBT neg. In setting of new symptomatic anemia in patient on [facility], IMTS consulted for admission.  Past Medical History:  Diagnosis Date   Anxiety    Arthritis    Asthma    CAD (coronary artery disease) 2007   a. 1997 s/p PCI of diagonal.   Constipation    COPD/Asthma    Diverticulosis    Esophageal reflux    Family history of adverse reaction to anesthesia    daughter had trouble breathing after surgery, had  to be reintubated   Glaucoma    HTN (hypertension), benign    Hyperlipidemia    Malignant melanoma ([place])    a. 09/2012 s/p resection.   Neuropathy    lower legs   PAF (paroxysmal atrial fibrillation) (HCC)    a. intolerant to beta blockers due to asthma and intolerant to CCB due to rash;  b. CHA2DS2VASc = 6-->chronic coumadin;  c. 03/2014 Echo: EF 60-65%, Gr 1 DD, triv AI, mildly dil RV, PASP 33mmhg.   TIA (transient ischemic attack)    a. 02/2011 - chronic coumadin in setting of PAF.   Urinary, incontinence, stress female    Meds:  Current Facility-Administered Medications for the 01/08/20 encounter [facility] Encounter)  Medication   omalizumab [name]) injection 375 mg   Current Meds  Medication Sig   acetaminophen (TYLENOL) 325 MG tablet Take 2 tablets (650 mg total) by mouth every 6 (six) hours as needed for mild pain (or Fever >/= 101).   albuterol (PROAIR HFA) 108 (90 Base) MCG/ACT inhaler Inhale 1-2 puffs into the lungs every 4 (four) hours as needed for wheezing or shortness of breath. (Patient taking differently: Inhale 1-2 puffs into the lungs daily. )   albuterol (PROVENTIL) (2.5 MG/3ML) 0.083% nebulizer solution Take 3 mLs (2.5 mg total) by nebulization every 6 (six) hours as needed for wheezing or shortness of breath. (Patient taking differently: Take 2.5 mg by nebulization daily. )  ALPRAZolam (XANAX) 0.5 MG tablet Take 1 tablet (0.5 mg total) by mouth 2 (two) times daily as needed for anxiety.   amLODipine (NORVASC) 10 MG tablet Take 10 mg by mouth daily.   cetirizine (ZYRTEC) 10 MG tablet Take 10 mg by mouth daily as needed for allergies.   citalopram (CELEXA) 10 MG tablet Take 10 mg by mouth daily.   clotrimazole-betamethasone (LOTRISONE) cream Apply 1 application topically 2 (two) times daily as needed for skin breakdown.   feeding supplement, ENSURE ENLIVE, (ENSURE ENLIVE) LIQD Take 237 mLs by mouth 3 (three) times daily between meals. (Patient taking  differently: Take 237 mLs by mouth daily. )   fluticasone (FLONASE) 50 MCG/ACT nasal spray USE 1 SPRAY IN EACH NOSTRIL ONCE A DAY AS NEEDED FOR ALLERGIES OR RHINITIS (Patient taking differently: Place 1 spray into both nostrils daily. )   fluticasone furoate-vilanterol (BREO ELLIPTA) 200-25 MCG/INH AEPB Inhale 1 puff into the lungs daily.   furosemide (LASIX) 20 MG tablet Take 1 tablet (20 mg total) by mouth daily.   latanoprost (XALATAN) 0.005 % ophthalmic solution Place 1 drop into both eyes at bedtime.   lisinopril (ZESTRIL) 40 MG tablet Take 40 mg by mouth daily.   metoprolol tartrate (LOPRESSOR) 25 MG tablet Take 1 tablet (25 mg total) by mouth 4 (four) times daily.   mirtazapine (REMERON SOL-TAB) 15 MG disintegrating tablet Take 1 tablet (15 mg total) by mouth at bedtime.   montelukast (SINGULAIR) 10 MG tablet TAKE 1 TABLET BY MOUTH EVERYDAY AT BEDTIME (Patient taking differently: Take 10 mg by mouth at bedtime. )   Multiple Vitamins-Minerals (MULTIVITAMIN & MINERAL PO) Take 1 tablet by mouth daily.   omalizumab [name]) 150 MG/ML prefilled syringe Inject 300 mg into the skin every 14 (fourteen) days.   omalizumab [name]) 75 MG/0.5ML prefilled syringe Inject 75 mg into the skin every 14 (fourteen) days.   pilocarpine (PILOCAR) 1 % ophthalmic solution Place 1 drop into both eyes 3 (three) times daily.    warfarin (COUMADIN) 2 MG tablet Take 1 tablet (2 mg total) by mouth daily. Start 11/02/2019 (Patient taking differently: Take 2 mg by mouth in the morning and at bedtime. 2mg  am   2mg  pm.)   -Of note, patient has not taken metoprolol or furosemide in last few weeks.  Allergies: Allergies as of 01/08/2020 - Review Complete 01/08/2020  Allergen Reaction Noted   Alphagan [brimonidine] Other (See Comments) 03/16/2014   Apraclonidine Other (See Comments) 07/03/2015   Biaxin [clarithromycin] Other (See Comments) 03/16/2014   Brovana [arformoterol] Other (See Comments)  09/24/2012   Budesonide Other (See Comments) 09/24/2012   Cefdinir Other (See Comments) 03/22/2014   Cephalexin Itching 05/11/2011   Cifenline Itching 03/28/2014   Clonidine Other (See Comments)    Clonidine derivatives Other (See Comments) 05/24/2013   Dorzolamide Other (See Comments) 04/08/2015   Iodinated diagnostic agents Other (See Comments) 02/22/2019   Iohexol Other (See Comments) 10/11/2009   Nsaids Other (See Comments) 03/16/2014   Penicillins Itching, Swelling, and Other (See Comments)    Sulfamethoxazole Other (See Comments) 03/16/2014   Sulfonamide derivatives Itching    Tetracycline Itching and Other (See Comments)    Timolol Other (See Comments) 06/11/2014   Vancomycin Other (See Comments)    Zetia [ezetimibe] Other (See Comments) 03/16/2014   Cardizem [diltiazem hcl] Rash 05/24/2013   Family History:  Family History  Problem Relation Age of Onset   Heart disease Father    Hemachromatosis Father    Heart attack Father  Colon cancer Mother    Peripheral vascular disease Mother    Peripheral vascular disease Sister    Cancer Sister    Lymphoma Sister    Heart attack Brother    Heart disease Brother    Social History:  Patient lives in [place] with her great-granddaughter, Colletta [place], who helps take care of her. She denies tobacco, alcohol, and recreational drug use.  Review of Systems: A complete ROS was negative except as per HPI.   Physical Exam: Blood pressure 116/73, pulse (!) 118, temperature 97.7 F (36.5 C), temperature source Oral, resp. rate (!) 23, height 5\' 6"  (1.676 m), weight 52.2 kg, SpO2 93 %. Physical Exam Constitutional:      General: She is awake. She is not in acute distress. HENT:     Head: Normocephalic and atraumatic.     Mouth/Throat:     Mouth: Mucous membranes are dry.  Eyes:     General: No scleral icterus.    Comments: Decreased visual acuity. Pale conjunctivae bilaterally.  Cardiovascular:      Rate and Rhythm: Tachycardia present. Rhythm irregularly irregular.     Heart sounds: S1 normal and S2 normal. No murmur heard.   Pulmonary:     Comments: Bilateral wheezing in upper lung fields. Bilateral rhonchi in lower lung fields Abdominal:     General: Abdomen is flat. Bowel sounds are normal. There is no distension.     Palpations: There is no shifting dullness.     Tenderness: There is no abdominal tenderness.  Musculoskeletal:     Left lower leg: No edema.     Comments: Visible scar below R knee from BKA.  Skin:    Comments: Bruising visible on R & L forearms. Multiple hematomas bilaterally proximal from wrists. Few eschars seen proximal from wrists as well.  Neurological:     Comments: Alert, oriented to person, place, situation. NFD. Sensory and motor function decreased in left leg below knee. Otherwise normal.  Psychiatric:        Behavior: Behavior is cooperative.   Labs: -CMP: Na 126 / K 4.1 / Cl 93 / CO2 25 / Glucose 115 / BUN 14 / Cr 0.77 / Ca 8.4 / Alb 3.2 / AST 25 / AL 17 / TBili 0.9  -CBC: WBC 6.2 / Hgb 7.1 / Hct 22.9 / MCV 96.6 / MCH 30 / MCHC 31 / RDW 16.9  -INR 3.6 / PT 34.5  -FOBT neg  -BNP: 302 / trop 5  EKG: personally reviewed my interpretation is tachycardia, atrial fibrillation.  CXR: personally reviewed my interpretation is RLL hazy infiltrate vs chronic changes. LLL consistent with past CXR 02/2019.  Head CT: No acute intracranial pathology.  Assessment & Plan by Problem: Active Problems:   Symptomatic anemia Patient is 89yoF w/ Afib on Coumadin, HTN, asthma, CAD, hx of osteomyelitis s/p RBKA 10/2019 who presents via EMS w/ SOB, weakness consistent with symptomatic anemia.  #Symptomatic Anemia Upon arrival H/H 7/22, SOB and weakness with increased O2 requirement (2L [place]). On Coumadin w/ last dose 7/5 AM. INR 3.6. No obvious source of active bleeding: denies abd pain, hemoptysis, CP. Head CT & FOBT neg. In ED received 1u pRBC. -1u pRBC transfusion  7/5 -Fe studies pending -DIC, retic pending -R/p CBC pending s/p transfusion -Holding Coumadin  #Asthma/COPD Increased O2 requirement, 2L [place] (no O2 needed at home). Regularly uses albuterol inhaler & nebs, Breo inhaler, and Xolair injections. CXR revealed atelectasis vs infiltrate. Clinically patient does not appear infectious: no leukocytosis,  afebrile, no crackles on exam. Will continue home meds. -2L [place] on admission 7/5 -Continue home regimen albuterol inhaler, nebs  #Afib HR 100's-110's on arrival. Takes Coumadin at home, 2mg  BID. INR on arrival 3.6. Will hold Coumadin in setting of anemia. Patient was on metoprolol but d/c a few weeks ago d/t hypotension. Will consult pharmacy in AM for [facility] dosing, consider low dose rate control. -INR 3.6 7/5 -Hold home Coumadin, consult pharmacy in AM -Hold metoprolol, consider rate control w/ BP monitoring  #Hyponatremia -Na 126 on arrival, r/p 131 -SOsm, UOsm pending  #Dementia Family concerned with dementia, as patient has had gradual cognitive decline over last year. Reportedly worsening of symptoms over last week. On arrival pt alert, oriented to person, place. Consider B12, folate. -Consider outpatient psych/geriatric for further evaluation   #Asymptomatic Bacteriuria Patient denies dysuria, polyuria. UA notable for protein, leukocytes, WBC, rare bacteria. -Holding abx -Monitor for sxs  #HTN  On arrival SBP 100-110s, on lisinopril and amlodipine at home. -Hold home meds  Family Contact: Trudie Reed, great granddaughter: [phone] Diet: HH IVF: NS 35mL/hr DVT PPX: Code Status: Full  Dispo: Admit patient to Observation with expected length of stay less than 2 midnights.  Signed: Sanjuan Dame, MD 01/09/2020, 2:48 AM  Pager: @MYPAGER @ After 5pm on weekdays and 1pm on weekends: On Call pager: [phone]

## 2020-01-09 DIAGNOSIS — D649 Anemia, unspecified: Secondary | ICD-10-CM | POA: Diagnosis present

## 2020-01-09 DIAGNOSIS — J9811 Atelectasis: Secondary | ICD-10-CM | POA: Diagnosis present

## 2020-01-09 DIAGNOSIS — J690 Pneumonitis due to inhalation of food and vomit: Secondary | ICD-10-CM | POA: Diagnosis present

## 2020-01-09 DIAGNOSIS — G9349 Other encephalopathy: Secondary | ICD-10-CM | POA: Diagnosis present

## 2020-01-09 DIAGNOSIS — R1310 Dysphagia, unspecified: Secondary | ICD-10-CM | POA: Diagnosis present

## 2020-01-09 DIAGNOSIS — J95811 Postprocedural pneumothorax: Secondary | ICD-10-CM | POA: Diagnosis not present

## 2020-01-09 DIAGNOSIS — Z7189 Other specified counseling: Secondary | ICD-10-CM | POA: Diagnosis not present

## 2020-01-09 DIAGNOSIS — J189 Pneumonia, unspecified organism: Secondary | ICD-10-CM | POA: Diagnosis not present

## 2020-01-09 DIAGNOSIS — E871 Hypo-osmolality and hyponatremia: Secondary | ICD-10-CM | POA: Insufficient documentation

## 2020-01-09 DIAGNOSIS — R791 Abnormal coagulation profile: Secondary | ICD-10-CM | POA: Diagnosis present

## 2020-01-09 DIAGNOSIS — J440 Chronic obstructive pulmonary disease with acute lower respiratory infection: Secondary | ICD-10-CM | POA: Diagnosis present

## 2020-01-09 DIAGNOSIS — J45901 Unspecified asthma with (acute) exacerbation: Secondary | ICD-10-CM | POA: Diagnosis present

## 2020-01-09 DIAGNOSIS — J449 Chronic obstructive pulmonary disease, unspecified: Secondary | ICD-10-CM | POA: Diagnosis not present

## 2020-01-09 DIAGNOSIS — J441 Chronic obstructive pulmonary disease with (acute) exacerbation: Secondary | ICD-10-CM | POA: Diagnosis present

## 2020-01-09 DIAGNOSIS — Z681 Body mass index (BMI) 19 or less, adult: Secondary | ICD-10-CM | POA: Diagnosis not present

## 2020-01-09 DIAGNOSIS — J9601 Acute respiratory failure with hypoxia: Secondary | ICD-10-CM | POA: Diagnosis present

## 2020-01-09 DIAGNOSIS — R54 Age-related physical debility: Secondary | ICD-10-CM | POA: Diagnosis present

## 2020-01-09 DIAGNOSIS — I10 Essential (primary) hypertension: Secondary | ICD-10-CM | POA: Diagnosis present

## 2020-01-09 DIAGNOSIS — Z515 Encounter for palliative care: Secondary | ICD-10-CM | POA: Diagnosis not present

## 2020-01-09 DIAGNOSIS — J939 Pneumothorax, unspecified: Secondary | ICD-10-CM | POA: Diagnosis present

## 2020-01-09 DIAGNOSIS — J9819 Other pulmonary collapse: Secondary | ICD-10-CM | POA: Diagnosis present

## 2020-01-09 DIAGNOSIS — J90 Pleural effusion, not elsewhere classified: Secondary | ICD-10-CM | POA: Diagnosis present

## 2020-01-09 DIAGNOSIS — I712 Thoracic aortic aneurysm, without rupture: Secondary | ICD-10-CM | POA: Diagnosis present

## 2020-01-09 DIAGNOSIS — E43 Unspecified severe protein-calorie malnutrition: Secondary | ICD-10-CM | POA: Diagnosis present

## 2020-01-09 DIAGNOSIS — Z20822 Contact with and (suspected) exposure to covid-19: Secondary | ICD-10-CM | POA: Diagnosis present

## 2020-01-09 DIAGNOSIS — R0602 Shortness of breath: Secondary | ICD-10-CM | POA: Diagnosis present

## 2020-01-09 DIAGNOSIS — I700 Atherosclerosis of aorta: Secondary | ICD-10-CM | POA: Diagnosis present

## 2020-01-09 DIAGNOSIS — I4819 Other persistent atrial fibrillation: Secondary | ICD-10-CM | POA: Diagnosis present

## 2020-01-09 DIAGNOSIS — Z66 Do not resuscitate: Secondary | ICD-10-CM | POA: Diagnosis not present

## 2020-01-09 LAB — CBC
HCT: 24.4 % — ABNORMAL LOW (ref 36.0–46.0)
HCT: 25.5 % — ABNORMAL LOW (ref 36.0–46.0)
Hemoglobin: 7.5 g/dL — ABNORMAL LOW (ref 12.0–15.0)
Hemoglobin: 7.8 g/dL — ABNORMAL LOW (ref 12.0–15.0)
MCH: 27.1 pg (ref 26.0–34.0)
MCH: 27.5 pg (ref 26.0–34.0)
MCHC: 30.7 g/dL (ref 30.0–36.0)
MCHC: 30.6 g/dL (ref 30.0–36.0)
MCV: 88.1 fL (ref 80.0–100.0)
MCV: 89.8 fL (ref 80.0–100.0)
Platelets: 136 10*3/uL — ABNORMAL LOW (ref 150–400)
Platelets: 118 10*3/uL — ABNORMAL LOW (ref 150–400)
RBC: 2.77 MIL/uL — ABNORMAL LOW (ref 3.87–5.11)
RBC: 2.84 MIL/uL — ABNORMAL LOW (ref 3.87–5.11)
RDW: 23 % — ABNORMAL HIGH (ref 11.5–15.5)
RDW: 22.8 % — ABNORMAL HIGH (ref 11.5–15.5)
WBC: 5 10*3/uL (ref 4.0–10.5)
WBC: 4.9 10*3/uL (ref 4.0–10.5)
nRBC: 0 % (ref 0.0–0.2)
nRBC: 0 % (ref 0.0–0.2)

## 2020-01-09 LAB — URINALYSIS, ROUTINE W REFLEX MICROSCOPIC
Bilirubin Urine: NEGATIVE
Glucose, UA: NEGATIVE mg/dL
Ketones, ur: NEGATIVE mg/dL
Nitrite: NEGATIVE
Protein, ur: 100 mg/dL — AB
Specific Gravity, Urine: 1.014 (ref 1.005–1.030)
WBC, UA: >50 WBC/hpf — ABNORMAL HIGH (ref 0–5)
pH: 6 (ref 5.0–8.0)

## 2020-01-09 LAB — TYPE AND SCREEN
ABO/RH(D): A POS
Antibody Screen: NEGATIVE
Unit division: 0

## 2020-01-09 LAB — BASIC METABOLIC PANEL
Anion gap: 7 (ref 5–15)
BUN: 11 mg/dL (ref 8–23)
CO2: 26 mmol/L (ref 22–32)
Calcium: 8.4 mg/dL — ABNORMAL LOW (ref 8.9–10.3)
Chloride: 97 mmol/L — ABNORMAL LOW (ref 98–111)
Creatinine, Ser: 0.75 mg/dL (ref 0.44–1.00)
GFR calc Af Amer: >60 mL/min (ref >60)
GFR calc non Af Amer: >60 mL/min (ref >60)
Glucose, Bld: 87 mg/dL (ref 70–99)
Potassium: 4.1 mmol/L (ref 3.5–5.1)
Sodium: 130 mmol/L — ABNORMAL LOW (ref 135–145)

## 2020-01-09 LAB — RETICULOCYTES
Immature Retic Fract: 26.3 % — ABNORMAL HIGH (ref 2.3–15.9)
RBC.: 2.93 MIL/uL — ABNORMAL LOW (ref 3.87–5.11)
Retic Count, Absolute: 90.2 10*3/uL (ref 19.0–186.0)
Retic Ct Pct: 3.1 % (ref 0.4–3.1)

## 2020-01-09 LAB — IRON AND TIBC
Iron: 72 ug/dL (ref 28–170)
Saturation Ratios: 20 % (ref 10.4–31.8)
TIBC: 353 ug/dL (ref 250–450)
UIBC: 281 ug/dL

## 2020-01-09 LAB — SAVE SMEAR(SSMR), FOR PROVIDER SLIDE REVIEW

## 2020-01-09 LAB — BPAM RBC
Blood Product Expiration Date: 202108032359
ISSUE DATE / TIME: 202107051911
Unit Type and Rh: 6200

## 2020-01-09 LAB — DIC (DISSEMINATED INTRAVASCULAR COAGULATION)PANEL
D-Dimer, Quant: 1.77 ug/mL-FEU — ABNORMAL HIGH (ref 0.00–0.50)
Fibrinogen: 366 mg/dL (ref 210–475)
INR: 3.2 — ABNORMAL HIGH (ref 0.8–1.2)
Platelets: 134 10*3/uL — ABNORMAL LOW (ref 150–400)
Prothrombin Time: 31.5 seconds — ABNORMAL HIGH (ref 11.4–15.2)
Smear Review: NONE SEEN
aPTT: 53 seconds — ABNORMAL HIGH (ref 24–36)

## 2020-01-09 LAB — FERRITIN: Ferritin: 62 ng/mL (ref 11–307)

## 2020-01-09 LAB — CBG MONITORING, ED: Glucose-Capillary: 91 mg/dL (ref 70–99)

## 2020-01-09 LAB — OSMOLALITY: Osmolality: 271 mOsm/kg — ABNORMAL LOW (ref 275–295)

## 2020-01-09 LAB — LACTIC ACID, PLASMA: Lactic Acid, Venous: 0.8 mmol/L (ref 0.5–1.9)

## 2020-01-09 LAB — PROCALCITONIN: Procalcitonin: <0.1 ng/mL

## 2020-01-09 LAB — OSMOLALITY, URINE: Osmolality, Ur: 433 mOsm/kg (ref 300–900)

## 2020-01-09 LAB — GLUCOSE, CAPILLARY: Glucose-Capillary: 126 mg/dL — ABNORMAL HIGH (ref 70–99)

## 2020-01-09 MED ORDER — IPRATROPIUM-ALBUTEROL 0.5-2.5 (3) MG/3ML IN SOLN: 3.0000 mL | Freq: Four times a day (QID) | RESPIRATORY_TRACT | Status: DC | PRN

## 2020-01-09 MED ORDER — METOPROLOL TARTRATE 12.5 MG HALF TABLET
12.5000 mg | ORAL_TABLET | Freq: Four times a day (QID) | ORAL | Status: DC
  Administered 2020-01-09 – 2020-01-10 (×4): 12.5 mg via ORAL
  Filled 2020-01-09 (×4): qty 1

## 2020-01-09 MED ORDER — FLUTICASONE FUROATE-VILANTEROL 200-25 MCG/INH IN AEPB
1.0000 | INHALATION_SPRAY | Freq: Every day | RESPIRATORY_TRACT | Status: DC
  Administered 2020-01-09: 1 via RESPIRATORY_TRACT
  Filled 2020-01-09 (×2): qty 28

## 2020-01-09 MED ORDER — IPRATROPIUM-ALBUTEROL 0.5-2.5 (3) MG/3ML IN SOLN
3.0000 mL | Freq: Four times a day (QID) | RESPIRATORY_TRACT | Status: DC
  Administered 2020-01-09: 3 mL via RESPIRATORY_TRACT
  Filled 2020-01-09: qty 3

## 2020-01-09 MED ORDER — PREDNISONE 20 MG PO TABS
40.0000 mg | ORAL_TABLET | Freq: Every day | ORAL | Status: DC
  Administered 2020-01-09 – 2020-01-10 (×2): 40 mg via ORAL
  Filled 2020-01-09 (×2): qty 2

## 2020-01-09 MED ORDER — PANTOPRAZOLE SODIUM 40 MG PO TBEC
40.0000 mg | DELAYED_RELEASE_TABLET | Freq: Every day | ORAL | Status: DC
  Administered 2020-01-09 – 2020-01-10 (×2): 40 mg via ORAL
  Filled 2020-01-09 (×2): qty 1

## 2020-01-09 MED ORDER — METOPROLOL TARTRATE 25 MG PO TABS: 12.5000 mg | ORAL_TABLET | Freq: Once | ORAL | Status: DC

## 2020-01-09 NOTE — ED Notes (Signed)
Pt RR increases to what RN believes is d/t anxiety. Through breathing couching techniques, the patient RR decreases to a rate 20-25. Pt does not appear to be an any acute distress or com,palining of SHOB.

## 2020-01-09 NOTE — ED Notes (Signed)
Breakfast ordered--[name]  

## 2020-01-09 NOTE — Discharge Planning (Signed)
Pt currently active with [facility] for RN/NA services.  Resumption of care requested. Glori Luis of [facility] notified.  No DME needs identified at this time.

## 2020-01-09 NOTE — Progress Notes (Signed)
Internal Medicine Attending Note:  Date: 01/09/2020  Patient name: [name]  Medical record number: [id number]  Date of birth: 28-Jun-1924   I have seen and evaluated [name] and discussed their care with the Residency Team.   In brief, patient is a pleasant 86 yo F with a pmhx of HTN, HLD, CAD, persistent atrial fibrillation on warfarin, Asthma-COPD overlap syndrome, and hx of osteomyelitis s/p recent right BKA on 10/26/19 who presented to the ED with shortness of breath and generalized weakness. Says she felt like she was having an asthma attack. Granddaughter with whom she lives reported she has had a cognitive decline over the past year, which acutely worsened in the past week. She reports paranoid behaviors and that the patient has tried to call the police on family.   On arrival to the ED she was saturating 99% on 2L [place]. She noted to be in afib with ventricular rates in the 120s. Hemodynamically stable. Work up was significant for INR of 3.6. Hemolgobin was low at 7.1, down from priors of 8-9. CMP with hyponatremia 126, normal renal and liver function. CT head was unremarkable. She was started on IVFs, given 1 unit of pRBC, and admitted to our service for further management.   PMHx, Fam Hx, and/or Soc Hx : per resident H&P  Vitals:   01/09/20 1228 01/09/20 1230  BP: 106/65 102/66  Pulse: (!) 106   Resp: (!) 22 (!) 21  Temp: 98 F (36.7 C)   SpO2: 95%    Physical Exam Constitutional: NAD, appears comfortable Cardiovascular: Irregular and tachycardic, no murmurs, rubs, or gallops.  Pulmonary/Chest: Tachypneic, on 3L [place], inspiratory and expiratory wheezing at the right lung base, clear on the left.  Abdominal: Soft, non tender, non distended. +BS.  Extremities: Warm and well perfused. Right BKA site is well healed Neurological: A&Ox3, CN II - XII grossly intact.  Skin: Multiple soft tissue hematomas on the upper extremities  Psychiatric: Normal mood and  affect   Assessment and Plan: I have seen and evaluated the patient as outlined above. I agree with the formulated Assessment and Plan as detailed in the residents' note, with the following changes:   1. Asthma / COPD Overlap Syndrome with exacerbation: Follows with pulmonology, last seen in January. Plan was to resume her biologic xolair. She is wheezing today with increasing oxygen requirement, 3L ->5L.  We have started prednisone 40 mg daily. Continue home Breo. I have also added scheduled duonebs q6h while awake. Additional albuterol nebs available PRN if needed. CXR showed bibasilar infiltrate, however appears somewhat chronic compared to prior films. She is afebrile without leukocytosis. Procal is low, doubt bacterial PNA. Hold off on antibiotics. Can add azithromycin if not improving with current therapy.   2. Hyponatremia: Serum osm not checked, urine osm 433, urine sodium not collected. Sodium has improved with IVFs, suspect hypovolemic hyponatremia. Continue to trend.   3. Acute on Chronic Anemia: Unclear baseline as hemoglobin appears to have downtrended over the past couple of years. Hgb was ~8-9 in February prior to her right LE BKA surgery. No evidence of bleeding aside from soft tissue hematomas on upper extremities. She is s/p 1 unit of pRBCs. Iron studies not consistent with IDA however looks like studies were collected after her transfusion. INR slightly supra therapeutic at 3.6, we are holding her warfarin.   4. Persistent A-fib, RVR: Rates uncontrolled, has not been taking her metoprolol at home due to soft BPs per granddaughter. BP is  normal here. Will resume at a reduced dose. Continue telemetry. She has had multiple admission with supra therapeutic INR. We discussed changing her warfarin to NOAC. She has non valvular a-fib and normal renal function. Will check with her insurance to make sure this is covered.   Rest per resident note.  Velna Ochs, MD 7/6/20211:02 PM

## 2020-01-09 NOTE — ED Notes (Signed)
Pt report sob. Pt was given 1 albuterol treatment. Pt Hr is 134 a-fib. Pt is 92% on 5L. Pt increased from 4L to 5L due to sob. Pt is sitting straight up in the bed. Pt has productive cough. Will continue to monitor and inform MD

## 2020-01-09 NOTE — Plan of Care (Signed)

## 2020-01-09 NOTE — ED Notes (Signed)
Report given to RN

## 2020-01-09 NOTE — Progress Notes (Signed)
Subjective: Examined patient at bedside in the ED.  Patient reports being hospitalized due to asthma. She is oriented to person and place, but not time (says she never remembers the year). She endorses weakness, stating that she cannot get to the chair or around the room. She denies any burning or pain with urination. She denies fevers but says that she feels warm during coughing spells. She feels cold otherwise. She states that she is not short of breath (on 3L via [place]).   Spoke with patient's granddaughter via phone call after rounds. Informed her about current labs and our management plan for the patient. When discussing the addition of PO prednisone for the patient's asthma exacerbation, she states that the patient usually gets a course of prednisone around the spring and summer, which is usually around when her asthma becomes worse. Discussed with granddaughter about her thoughts on switching patient's warfarin to a NOAC (ie, eliquis) and explained risks and benefits. She states that she is very interested in switching to eliquis. Also mentioned to granddaughter that social work has been consulted so they will be able to give them more information in regards to cost of NOACs. Also informed her that we will not be switching to eliquis immediately as we still want to monitor patient's Hgb and INR until they are stable. Patient confirmed understanding. Also talked to her about patient's hyponatremia. She states that patient does not follow the diet recommended for her. States that usually they have to strongly urge the patient to drink water and that on average patient drinks "4 bottles of store-bought water" and 1 can of ginger-ale (ginger-ale is her favorite). Sometimes they also add an Ensure. She also reports that a couple of weeks ago, EMTs came to her house because the patient's "face was drooping" and that her symptoms "immediately resolved" after EMS gave her water. EMS told her that the patient was  dehydrated. Patient's granddaughter also reports a concern for a long hospital stay for the patient because after her prior hospitalization for the BKA, the patient's functional status drastically declined. Informed her that we are still expecting for the patient to stay here at least one more day and possibly longer in order to stabilize the patient before discharge. She confirms understanding. Lastly, confirmed with granddaughter about the patient's code status. She states that the patient was DNR in April 2021 HOWEVER the patient is now FULL CODE.    After rounding, the patient reported shortness of breath and began desaturating to 89% on 3L [place] with associated tachypnea and tachycardia. Nasal cannula was increased to 5L, after which her sats increased to 94% and her tachypnea resolved. She continues to be tachycardic.    Objective:  Vital signs in last 24 hours: Vitals:   01/09/20 1215 01/09/20 1228 01/09/20 1230 01/09/20 1300  BP:  106/65 102/66 113/69  Pulse:  (!) 106  (!) 115  Resp: (!) 22 (!) 22 (!) 21 20  Temp:  98 F (36.7 C)    TempSrc:  Oral    SpO2:  95%  94%  Weight:      Height:       Physical Exam Constitutional: Lying comfortably in bed, NAD. Cardiovascular: Irregularly irregular and tachycardic, no murmurs, rubs, or gallops.  Pulmonary/Chest: Tachypneic on 3L , inspiratory and expiratory wheezing at the right lung base, clear on the left.  Abdominal: Soft, non tender, non distended. (+) BS in all quadrants.  Extremities: Warm and well perfused. Right BKA site is  well healed. 2+ pedal pulse in LLE. Decreased sensation distal to the ankle on LLE. Neurological: Alert and oriented to person and place, not to time.  Skin: Multiple soft tissue hematomas on the upper extremities bilaterally. Psychiatric: Normal mood and affect.  Assessment/Plan:  Active Problems:   Acute on chronic anemia   1. Asthma/COPD exacerbation - Presented to ED with SOB requiring increased O2  at 2L [place] (as per granddaughter, patient sats >94% RA at baseline at home) - Regularly uses albuterol inhaler and nebs, Breo inhaler, and Xolair injections. Granddaughter states that patient requires course of oral prednisone every so often (especially during spring and summer) d/t asthma exacerbations. - Follows with pulmonology, last seen in January with plan to resume xolair. - CXR shows L sided infiltrates relatively unchanged from prior CXR and new R sided interstitial infiltrates - Afebrile w/o leukocytosis - Procalcitonin w/in normal limits so likely not bacterial pneumonia. - Hold off on abx for now, but can add azithromycin if patient does not improve with current therapy. - On 01/09/20: R sided wheezing noticed on physical exam with increasing O2 requirements from 3L --> 5L [place]. - Initiated prednisone 40mg  daily - Continue home Breo - Scheduled duonebs q6h - Albuterol nebs available prn  2. Hyponatremia likely 2/2 hypovolemia:  - Granddaughter states that patient drinks 4 bottles of water daily.  - EMS was called to patient's home two weeks ago for an episode of dehydration. - At admission, sodium 126 - Serum osm 271, urine osm 433, urine sodium not collected.  - Sodium improved with IVFs (126 -->131), consistent with dehydration.  - Continue to trend.   3. Acute on Chronic Anemia vs Post-op (s/p BKA in 02/21):  - Unclear baseline as hemoglobin appears to have downtrended over the past couple of years. Hgb was ~8-9 in February prior to her Right BKA.  - No evidence of bleeding aside from soft tissue hematomas on upper extremities. - She is s/p 1 unit of pRBCs.  - Iron studies not consistent with IDA. However, studies may have been collected after her transfusion.  - INR slightly supratherapeutic at 3.6 (was 7.1 on admission). Hold off on warfarin.   4. Persistent A-fib, RVR:  - Non valvular a-fib (no mitral valve disease and no artificial heart valves) and normal renal fxn -  Not rate controlled, HR 100s-110s. Has not been taking her metoprolol at home d/t soft Bps as per granddaughter. - BP relatively normal here (100s-110s / 60s-80s) - Resumed low dose metoprolol 12.5mg  - Continue telemetry - Monitor CBC, BMP, PT/INR - Discussed with patient's granddaughter about changing patient's warfarin to a NOAC. Will check with her insurance to make sure this is covered -- social work on [employer].  5. Dementia - Family concerned with dementia, as patient has had gradual cognitive decline over last year. Reportedly worsening of symptoms over last week.  - Patient alert and oriented to person, place, but not time. - Consider outpatient psych/geriatric for further evaluation   6. Asymptomatic Bacteriuria - Patient denies pain or burning with urination. Also denies polyuria.  - UA notable for protein, leukocytes, WBC, rare bacteria. - Holding abx - Monitor for sxs  7. HTN  - On arrival SBP 100-110s, on lisinopril and amlodipine at home. -Hold home meds   Prior to Admission Living Arrangement: Home (with home health services) Anticipated Discharge Location: TBD Barriers to Discharge: Continued medical management Dispo: Anticipated discharge in approximately 3-4 day(s).   Virl Axe, MD 01/09/2020, 2:05 PM  Pager: [phone] After 5pm on weekdays and 1pm on weekends: On Call pager [phone]

## 2020-01-10 ENCOUNTER — Inpatient Hospital Stay (HOSPITAL_COMMUNITY): Payer: Medicare HMO

## 2020-01-10 LAB — BASIC METABOLIC PANEL
Anion gap: 8 (ref 5–15)
BUN: 21 mg/dL (ref 8–23)
CO2: 26 mmol/L (ref 22–32)
Calcium: 8.5 mg/dL — ABNORMAL LOW (ref 8.9–10.3)
Chloride: 96 mmol/L — ABNORMAL LOW (ref 98–111)
Creatinine, Ser: 0.76 mg/dL (ref 0.44–1.00)
GFR calc Af Amer: >60 mL/min (ref >60)
GFR calc non Af Amer: >60 mL/min (ref >60)
Glucose, Bld: 98 mg/dL (ref 70–99)
Potassium: 4.4 mmol/L (ref 3.5–5.1)
Sodium: 130 mmol/L — ABNORMAL LOW (ref 135–145)

## 2020-01-10 LAB — CBC
HCT: 27.6 % — ABNORMAL LOW (ref 36.0–46.0)
Hemoglobin: 8.7 g/dL — ABNORMAL LOW (ref 12.0–15.0)
MCH: 28.1 pg (ref 26.0–34.0)
MCHC: 31.5 g/dL (ref 30.0–36.0)
MCV: 89 fL (ref 80.0–100.0)
Platelets: 133 10*3/uL — ABNORMAL LOW (ref 150–400)
RBC: 3.1 MIL/uL — ABNORMAL LOW (ref 3.87–5.11)
RDW: 22.2 % — ABNORMAL HIGH (ref 11.5–15.5)
WBC: 5.6 10*3/uL (ref 4.0–10.5)
nRBC: 0 % (ref 0.0–0.2)

## 2020-01-10 LAB — PROTIME-INR
INR: 4 — ABNORMAL HIGH (ref 0.8–1.2)
Prothrombin Time: 37.7 seconds — ABNORMAL HIGH (ref 11.4–15.2)

## 2020-01-10 MED ORDER — MIRTAZAPINE 15 MG PO TBDP
15.0000 mg | ORAL_TABLET | Freq: Every day | ORAL | Status: DC
  Administered 2020-01-10: 15 mg via ORAL
  Filled 2020-01-10: qty 1

## 2020-01-10 MED ORDER — FUROSEMIDE 10 MG/ML IJ SOLN: 40.0000 mg | Freq: Once | INTRAMUSCULAR | Status: DC

## 2020-01-10 MED ORDER — LACTATED RINGERS IV BOLUS
1000.0000 mL | Freq: Once | INTRAVENOUS | Status: AC
  Administered 2020-01-10: 1000 mL via INTRAVENOUS

## 2020-01-10 MED ORDER — SODIUM CHLORIDE 0.9 % IV SOLN
500.0000 mg | INTRAVENOUS | Status: DC
  Administered 2020-01-10 – 2020-01-11 (×2): 500 mg via INTRAVENOUS
  Filled 2020-01-10 (×3): qty 500

## 2020-01-10 MED ORDER — GUAIFENESIN ER 600 MG PO TB12: 600.0000 mg | ORAL_TABLET | Freq: Two times a day (BID) | ORAL | Status: DC | PRN

## 2020-01-10 MED ORDER — SODIUM CHLORIDE 0.9 % IV SOLN
1.0000 g | INTRAVENOUS | Status: AC
  Administered 2020-01-10 – 2020-01-14 (×5): 1 g via INTRAVENOUS
  Filled 2020-01-10 (×5): qty 10

## 2020-01-10 MED ORDER — METOPROLOL TARTRATE 25 MG PO TABS
25.0000 mg | ORAL_TABLET | Freq: Four times a day (QID) | ORAL | Status: DC
  Administered 2020-01-10 (×2): 25 mg via ORAL
  Filled 2020-01-10 (×2): qty 1

## 2020-01-10 MED ORDER — CITALOPRAM HYDROBROMIDE 10 MG PO TABS
10.0000 mg | ORAL_TABLET | Freq: Every day | ORAL | Status: DC
  Administered 2020-01-10 – 2020-01-13 (×4): 10 mg via ORAL
  Filled 2020-01-10 (×4): qty 1

## 2020-01-10 MED ORDER — FUROSEMIDE 10 MG/ML IJ SOLN
40.0000 mg | Freq: Once | INTRAMUSCULAR | Status: AC
  Administered 2020-01-10: 40 mg via INTRAVENOUS
  Filled 2020-01-10: qty 4

## 2020-01-10 NOTE — Progress Notes (Signed)
eLink Physician-Brief Progress Note Patient Name: [name] DOB: [birth year] MRN: [id number]   Date of Service  01/10/2020  HPI/Events of Note  Patient with left lung collapse in the context of intra-luminal debris suggestive of aspiration and endobronchial obstruction, as well as significant pleural effusions with a possible compressive component,  Fortunately patient is hemodynamically stable and oxygenating quite well. Will add patient to PCCM list for bronchoscopy  + or _ thoracentesis in a.m.  eICU Interventions  Patient added to PCCM list. PCCM had seen the patient earlier today per resident's  Communication to me.        Kerry Kass Ruhaan Nordahl 01/10/2020, 10:03 PM

## 2020-01-10 NOTE — Progress Notes (Signed)
On assessment, patient's respirations notably more rhonchus with decreased breath sounds throughout Lt lung fields.  Continues with labored breathing and now indicates to me that she feels short of breath.  MD notified after IV rate reduced to [facility].

## 2020-01-10 NOTE — Progress Notes (Signed)
Patient noted to have more labored breathing and use of accessory muscles of inspiration with retraction.  Oxygen levels remain 92-100%, though labored still.  MD notified on rounds.  See NO.

## 2020-01-10 NOTE — Progress Notes (Addendum)
Subjective: Patient was seen at bedside today. She reports that she is still having some shortness of breath and a cough, she states she is not able to cough anything up, feels like it's stuck. She reports still having some weakness. We discussed plan to continue treatment for her asthma and her anemia. She expressed understanding.   Objective:  Vital signs in last 24 hours: Vitals:   01/10/20 0011 01/10/20 0536 01/10/20 0749 01/10/20 1640  BP: 122/87 113/79 102/75 119/66  Pulse: (!) 101 93 75 85  Resp:   16 16  Temp:   97.6 F (36.4 C) 98.2 F (36.8 C)  TempSrc:    Oral  SpO2:   96% 94%  Weight:      Height:       General: Elderly female, NAD laying in bed Cardiac: Irregularly irregular, normal rate, no m/r/g Pulmonary: Mildly labored breathing, on 5LNC, bibasilar crackles, diffuse wheezing Abdominal: Soft, non-tender, non-distended, normoactive BS Extremities: No LE swelling, right BKA  Assessment/Plan:  Principal Problem:   Asthma-COPD overlap syndrome (HCC) Active Problems:   Acute on chronic anemia  This is a 86 year old female with a history of HTN, CAD, persistent atrial fibrillation on warfarin, asthma-COPD overlap, and recent osteomyelitis s/p BKA in April 2021 who presented with SOB and generalized weakness. Noted to have an asthma/COPD exacerbation and symptomatic anemia.   1. Asthma/COPD exacerbation - Presented to ED with SOB requiring increased O2 at 2L [place] (as per granddaughter, patient sats >94% RA at baseline at home) - Regularly uses albuterol inhaler and nebs, Breo inhaler, and Xolair injections. Granddaughter states that patient requires course of oral prednisone every so often (especially during spring and summer) d/t asthma exacerbations. - Follows with pulmonology, last seen in January with plan to resume xolair. - CXR shows L sided infiltrates relatively unchanged from prior CXR and new R sided interstitial infiltrates - Afebrile w/o leukocytosis -  Procalcitonin w/in normal limits so likely not bacterial pneumonia. - Hold off on abx for now, but can add azithromycin if patient does not improve with current therapy. - On 01/09/20: R sided wheezing noticed on physical exam with increasing O2 requirements from 3L --> 5L [place]. - On 01/10/20: Gave fluids for the hyponatremia. Noted mildly increased WOB on exam, up to 6L [place] now with worsening symptoms. Obtained X-ray that showed diffuse opacity in the left lung field. VSS, still on 5L [place]. Unclear etiology at this time. Given 1 dose IV lasix, started guaifenesin, and ordered CT chest to further evaluate. Given her worsening respiratory symptoms and failure to improve on current therapy, we added levofloxacin. Pulm/CCM was consulted, stated that mucus plugging is more likely. PCCM recommended waiting on CT chest, call back if CT chest shows severe pulmonary disease. Also recommended calling respiratory therapy to give patient chest PT and ordering a flutter valve to help dislodge the mucus plug.  - Continue prednisone 40mg  daily, day 2 - Continue home Breo - Scheduled duonebs q6h - Albuterol nebs available prn - S/p 40 mg IV lasix - Ordered CT chest   2. Hyponatremia likely 2/2 hypovolemia: - Granddaughter states that patient drinks 4 bottles of water daily.  - EMS was called to patient's home two weeks ago for an episode of dehydration. - At admission, sodium 126 - Serum osm 271, urine osm 433, urine sodium not collected.  - Sodium improved with IVFs (126 -->131), consistent with dehydration.  - Today sodium 130, gave LR bolus however discontinued because pt  developed worsening respiratory status, received about 500 cc.  - Continue to trend.   3. Acute on Chronic Anemia vs Post-op (s/p BKA in 02/21): - Unclear baseline as hemoglobin appears to have downtrended over the past couple of years. Hgb was ~8-9 in February prior to her Right BKA.  - No evidence of bleeding aside from soft tissue  hematomas on upper extremities. - She is s/p 1 unit of pRBCs.  - Iron studies not consistent with IDA. However, studies may have been collected after her transfusion.  - INR slightly supratherapeutic at 3.6 (was 7.1 on admission). Now up to 4 today, unclear etiology. Also has thrombocytopenia so could consider abdominal imaging to evaluate liver. Hold off on warfarin.   4. Persistent A-fib, RVR: - Non valvular a-fib (no mitral valve disease and no artificial heart valves) and normal renal fxn - Not rate controlled, HR 100s-110s. Has not been taking her metoprolol at home d/t soft Bps as per granddaughter. - BP relatively normal here (100s-110s / 60s-80s) - Resumed home dose metoprolol 25 mg - Continue telemetry - Monitor CBC, BMP, PT/INR - Discussed with patient's granddaughter about changing patient's warfarin to a NOAC. Will check with her insurance to make sure this is covered -- social work on [employer].  5. Dementia - Family concerned with dementia, as patient has had gradual cognitive decline over last year. Reportedly worsening of symptoms over last week.  - Patient alert and oriented to person, place, but not time. - Consider outpatient psych/geriatric for further evaluation  6. Asymptomatic Bacteriuria - Patient denies pain or burning with urination. Also denies polyuria.  - UA notable for protein, leukocytes, WBC, rare bacteria. - Holding abx - Monitor for sxs  7. HTN - On arrival SBP 100-110s, on lisinopril and amlodipine at home. - Hold home meds   Prior to Admission Living Arrangement: Home (with home health services) Anticipated Discharge Location: TBD Barriers to Discharge: Continued medical management Dispo: Anticipated discharge in approximately 3-4 day(s).   Virl Axe, MD 01/10/2020, 6:22 PM Pager: [phone] After 5pm on weekdays and 1pm on weekends: On Call pager [phone]

## 2020-01-10 NOTE — Progress Notes (Signed)
40 mg Lasix administered via IV route as per DO

## 2020-01-11 ENCOUNTER — Inpatient Hospital Stay (HOSPITAL_COMMUNITY): Payer: Medicare HMO

## 2020-01-11 ENCOUNTER — Inpatient Hospital Stay (HOSPITAL_COMMUNITY): Payer: Medicare HMO | Admitting: Anesthesiology

## 2020-01-11 ENCOUNTER — Telehealth: Payer: Self-pay | Admitting: Pulmonary Disease

## 2020-01-11 DIAGNOSIS — J90 Pleural effusion, not elsewhere classified: Secondary | ICD-10-CM | POA: Diagnosis present

## 2020-01-11 DIAGNOSIS — J9601 Acute respiratory failure with hypoxia: Secondary | ICD-10-CM

## 2020-01-11 DIAGNOSIS — J9811 Atelectasis: Secondary | ICD-10-CM | POA: Diagnosis present

## 2020-01-11 LAB — CBC
HCT: 29.9 % — ABNORMAL LOW (ref 36.0–46.0)
HCT: 32.1 % — ABNORMAL LOW (ref 36.0–46.0)
Hemoglobin: 9.3 g/dL — ABNORMAL LOW (ref 12.0–15.0)
Hemoglobin: 9.7 g/dL — ABNORMAL LOW (ref 12.0–15.0)
MCH: 27.7 pg (ref 26.0–34.0)
MCH: 27.7 pg (ref 26.0–34.0)
MCHC: 30.2 g/dL (ref 30.0–36.0)
MCHC: 31.1 g/dL (ref 30.0–36.0)
MCV: 89 fL (ref 80.0–100.0)
MCV: 91.7 fL (ref 80.0–100.0)
Platelets: 150 10*3/uL (ref 150–400)
Platelets: 168 10*3/uL (ref 150–400)
RBC: 3.36 MIL/uL — ABNORMAL LOW (ref 3.87–5.11)
RBC: 3.5 MIL/uL — ABNORMAL LOW (ref 3.87–5.11)
RDW: 21.7 % — ABNORMAL HIGH (ref 11.5–15.5)
RDW: 21.7 % — ABNORMAL HIGH (ref 11.5–15.5)
WBC: 7.3 10*3/uL (ref 4.0–10.5)
WBC: 9 10*3/uL (ref 4.0–10.5)
nRBC: 0 % (ref 0.0–0.2)
nRBC: 0 % (ref 0.0–0.2)

## 2020-01-11 LAB — GLUCOSE, CAPILLARY
Glucose-Capillary: 105 mg/dL — ABNORMAL HIGH (ref 70–99)
Glucose-Capillary: 111 mg/dL — ABNORMAL HIGH (ref 70–99)
Glucose-Capillary: 122 mg/dL — ABNORMAL HIGH (ref 70–99)
Glucose-Capillary: 193 mg/dL — ABNORMAL HIGH (ref 70–99)
Glucose-Capillary: 198 mg/dL — ABNORMAL HIGH (ref 70–99)
Glucose-Capillary: 76 mg/dL (ref 70–99)

## 2020-01-11 LAB — POCT I-STAT 7, (LYTES, BLD GAS, ICA,H+H)
Acid-Base Excess: 11 mmol/L — ABNORMAL HIGH (ref 0.0–2.0)
Bicarbonate: 34.4 mmol/L — ABNORMAL HIGH (ref 20.0–28.0)
Calcium, Ion: 1.12 mmol/L — ABNORMAL LOW (ref 1.15–1.40)
HCT: 28 % — ABNORMAL LOW (ref 36.0–46.0)
Hemoglobin: 9.5 g/dL — ABNORMAL LOW (ref 12.0–15.0)
O2 Saturation: 100 %
Patient temperature: 98
Potassium: 3.9 mmol/L (ref 3.5–5.1)
Sodium: 133 mmol/L — ABNORMAL LOW (ref 135–145)
TCO2: 36 mmol/L — ABNORMAL HIGH (ref 22–32)
pCO2 arterial: 38.9 mmHg (ref 32.0–48.0)
pH, Arterial: 7.554 — ABNORMAL HIGH (ref 7.350–7.450)
pO2, Arterial: 456 mmHg — ABNORMAL HIGH (ref 83.0–108.0)

## 2020-01-11 LAB — PROTEIN, PLEURAL OR PERITONEAL FLUID: Total protein, fluid: <3 g/dL

## 2020-01-11 LAB — BASIC METABOLIC PANEL
Anion gap: 12 (ref 5–15)
BUN: 24 mg/dL — ABNORMAL HIGH (ref 8–23)
CO2: 27 mmol/L (ref 22–32)
Calcium: 8.7 mg/dL — ABNORMAL LOW (ref 8.9–10.3)
Chloride: 95 mmol/L — ABNORMAL LOW (ref 98–111)
Creatinine, Ser: 0.94 mg/dL (ref 0.44–1.00)
GFR calc Af Amer: >60 mL/min (ref >60)
GFR calc non Af Amer: 52 mL/min — ABNORMAL LOW (ref >60)
Glucose, Bld: 136 mg/dL — ABNORMAL HIGH (ref 70–99)
Potassium: 4.3 mmol/L (ref 3.5–5.1)
Sodium: 134 mmol/L — ABNORMAL LOW (ref 135–145)

## 2020-01-11 LAB — MAGNESIUM: Magnesium: 1.6 mg/dL — ABNORMAL LOW (ref 1.7–2.4)

## 2020-01-11 LAB — LACTATE DEHYDROGENASE, PLEURAL OR PERITONEAL FLUID: LD, Fluid: 73 U/L — ABNORMAL HIGH (ref 3–23)

## 2020-01-11 LAB — PATHOLOGIST SMEAR REVIEW

## 2020-01-11 LAB — PHOSPHORUS: Phosphorus: 1.9 mg/dL — ABNORMAL LOW (ref 2.5–4.6)

## 2020-01-11 LAB — PROTIME-INR
INR: 2.7 — ABNORMAL HIGH (ref 0.8–1.2)
Prothrombin Time: 27.6 seconds — ABNORMAL HIGH (ref 11.4–15.2)

## 2020-01-11 LAB — MRSA PCR SCREENING: MRSA by PCR: NEGATIVE

## 2020-01-11 MED ORDER — POLYETHYLENE GLYCOL 3350 17 G PO PACK
17.0000 g | PACK | Freq: Every day | ORAL | Status: DC
  Administered 2020-01-11 – 2020-01-13 (×3): 17 g via ORAL
  Filled 2020-01-11 (×3): qty 1

## 2020-01-11 MED ORDER — MIDAZOLAM HCL 2 MG/2ML IJ SOLN
INTRAMUSCULAR | Status: AC
  Administered 2020-01-11: 2 mg
  Filled 2020-01-11: qty 2

## 2020-01-11 MED ORDER — LIDOCAINE HCL (PF) 1 % IJ SOLN
INTRAMUSCULAR | Status: AC
  Filled 2020-01-11: qty 30

## 2020-01-11 MED ORDER — DOCUSATE SODIUM 50 MG/5ML PO LIQD
100.0000 mg | Freq: Two times a day (BID) | ORAL | Status: DC
  Administered 2020-01-11 – 2020-01-13 (×4): 100 mg via ORAL
  Filled 2020-01-11 (×5): qty 10

## 2020-01-11 MED ORDER — FENTANYL CITRATE (PF) 100 MCG/2ML IJ SOLN
INTRAMUSCULAR | Status: AC
  Filled 2020-01-11: qty 2

## 2020-01-11 MED ORDER — LIDOCAINE HCL (PF) 1 % IJ SOLN
INTRAMUSCULAR | Status: AC
  Administered 2020-01-11: 5 mL
  Filled 2020-01-11: qty 10

## 2020-01-11 MED ORDER — CHLORHEXIDINE GLUCONATE CLOTH 2 % EX PADS
6.0000 | MEDICATED_PAD | Freq: Every day | CUTANEOUS | Status: DC
  Administered 2020-01-11 – 2020-01-15 (×5): 6 via TOPICAL

## 2020-01-11 MED ORDER — DEXMEDETOMIDINE HCL IN NACL 400 MCG/100ML IV SOLN
0.0000 ug/kg/h | INTRAVENOUS | Status: AC
  Administered 2020-01-11 – 2020-01-13 (×3): 0.4 ug/kg/h via INTRAVENOUS
  Filled 2020-01-11 (×4): qty 100

## 2020-01-11 MED ORDER — ORAL CARE MOUTH RINSE
15.0000 mL | OROMUCOSAL | Status: DC
  Administered 2020-01-11 – 2020-01-14 (×35): 15 mL via OROMUCOSAL

## 2020-01-11 MED ORDER — LIDOCAINE HCL (PF) 1 % IJ SOLN
INTRAMUSCULAR | Status: AC
  Filled 2020-01-11: qty 5

## 2020-01-11 MED ORDER — IPRATROPIUM-ALBUTEROL 0.5-2.5 (3) MG/3ML IN SOLN
3.0000 mL | Freq: Four times a day (QID) | RESPIRATORY_TRACT | Status: DC
  Administered 2020-01-11 – 2020-01-16 (×23): 3 mL via RESPIRATORY_TRACT
  Filled 2020-01-11 (×23): qty 3

## 2020-01-11 MED ORDER — CHLORHEXIDINE GLUCONATE 0.12% ORAL RINSE (MEDLINE KIT)
15.0000 mL | Freq: Two times a day (BID) | OROMUCOSAL | Status: DC
  Administered 2020-01-11 – 2020-01-14 (×9): 15 mL via OROMUCOSAL

## 2020-01-11 MED ORDER — PANTOPRAZOLE SODIUM 40 MG IV SOLR
40.0000 mg | Freq: Every day | INTRAVENOUS | Status: DC
  Administered 2020-01-11 – 2020-01-17 (×7): 40 mg via INTRAVENOUS
  Filled 2020-01-11 (×7): qty 40

## 2020-01-11 MED ORDER — FENTANYL CITRATE (PF) 100 MCG/2ML IJ SOLN
25.0000 ug | INTRAMUSCULAR | Status: DC | PRN
  Administered 2020-01-11 (×2): 50 ug via INTRAVENOUS
  Filled 2020-01-11: qty 2

## 2020-01-11 MED ORDER — FENTANYL CITRATE (PF) 100 MCG/2ML IJ SOLN
25.0000 ug | INTRAMUSCULAR | Status: DC | PRN
  Administered 2020-01-15: 25 ug via INTRAVENOUS
  Filled 2020-01-11 (×2): qty 2

## 2020-01-11 MED ORDER — VITAL AF 1.2 CAL PO LIQD
1000.0000 mL | ORAL | Status: DC
  Administered 2020-01-11 – 2020-01-13 (×3): 1000 mL

## 2020-01-11 MED ORDER — LIDOCAINE HCL (PF) 1 % IJ SOLN
INTRAMUSCULAR | Status: AC
  Administered 2020-01-11: 5 mL
  Filled 2020-01-11: qty 30

## 2020-01-11 MED FILL — Medication: Qty: 1 | Status: AC

## 2020-01-11 NOTE — Progress Notes (Signed)
CPT help at this time.  Patient bronched at bedside.

## 2020-01-11 NOTE — Progress Notes (Signed)
MD and RN at bedside for Pigtail CT placement, time out complete, patient given consent. Left pigtail CT placed without complications. RT notified.

## 2020-01-11 NOTE — Progress Notes (Signed)
RT attempted ABG x2. Unable to obtain. 

## 2020-01-11 NOTE — Procedures (Signed)
Bronchoscopy Procedure Note  Date of Operation: 01/11/2020  Pre-op Diagnosis: Left lung atelectasis  Post-op Diagnosis: Left lung atelectasis; mucous plug  Surgeon: Renee Pain  Assistants: Kennieth Rad, NP  Anesthesia: Conscious sedation (Versed 4 mg, fentanyl 50 mcg)  Operation: Flexible fiberoptic bronchoscopy, diagnostic bronchoscopy  Findings: Copious thick inspissated secretions in the left lower lobe bronchus, cleared with suctioning.  Specimen: Bronchial washings from the left lower lobe  Estimated Blood Loss: Minimal  Drains: None.  Complications: None  Indications and History: The patient is a 86 y.o. female with left lung atelectasis.  The risks, benefits, complications, treatment options and expected outcomes were discussed with the patient's granddaughter.  The possibilities of reaction to medication, pulmonary aspiration, perforation of a viscus, bleeding, failure to diagnose a condition and creating a complication requiring transfusion or operation were discussed with the patient's granddaughter, who freely signed the consent.    Description of Procedure: The patient was seen in 3M12.  The patient was identified as [name], and the procedure verified as Flexible Fiberoptic Bronchoscopy.  A Time Out was held and the above information confirmed.   After achieving moderate sedation, the bronchoscope was passed through the ventilator circuit adapter and advanced through the endotracheal tube without difficulty. 1% plain lidocaine 10 ml was used topically on the carina.  Careful inspection of the tracheal lumen was accomplished. The scope was sequentially passed into the left main and then left upper and lower bronchi and segmental bronchi.  Bronchial washing was done in the left lower lobe.  Specimen was collected for Gram stain, culture and sensitivities..   The scope was then withdrawn and advanced into the right main bronchus and then into the RUL,  RML, and RLL bronchi and segmental bronchi.   Endobronchial findings: Thick inspissated sputum in the left lower lobe bronchus. Trachea: Normal mucosa Carina: Normal mucosa Right main bronchus: Normal mucosa Right upper lobe bronchus: Normal mucosa Right middle lobe bronchus: Narrow.  Normal mucosa Right lower lobe bronchus: Normal mucosa Left main bronchus: Normal mucosa Left upper lobe bronchus: Normal mucosa Left lower lobe bronchus: Collapse with mucus plugging  After completion of the procedure, the patient was kept in the ICU on mechanical ventilatory support.  No complications.  Renee Pain, MD Board Certified by the ABIM, [place]

## 2020-01-11 NOTE — Progress Notes (Addendum)
Bedside thoracentesis procedure completed with MD and RN at bedside. Patient A&Ox4 on ventilator, gave consent and time out completed at 1005.  Patient tolerated well and 800cc removed via drainage bag.

## 2020-01-11 NOTE — Procedures (Signed)
Insertion of Chest Tube Procedure Note  [name]  [id number]  08/19/1923  Date:01/11/20  Time:1:17 PM    Provider Performing: Kipp Brood   Procedure: Pleural Catheter Insertion w/o Imaging Guidance [phone])  Indication(s) Pneumothorax  Consent Unable to obtain consent due to emergent nature of procedure.  Anesthesia Topical only with 1% lidocaine    Time Out Verified patient identification, verified procedure, site/side was marked, verified correct patient position, special equipment/implants available, medications/allergies/relevant history reviewed, required imaging and test results available.   Sterile Technique Maximal sterile technique including full sterile barrier drape, hand hygiene, sterile gown, sterile gloves, mask, hair covering, sterile ultrasound probe cover (if used).   Procedure Description Ultrasound not used to identify appropriate pleural anatomy for placement and overlying skin marked. Area of placement cleaned and draped in sterile fashion.  A 8 French pigtail pleural catheter was placed into the left pleural space using Seldinger technique. Appropriate return of air was obtained.  The tube was connected to atrium and placed on -20 cm H2O wall suction.   Complications/Tolerance None; patient tolerated the procedure well. Chest X-ray is ordered to verify placement.   EBL Minimal  Specimen(s) none

## 2020-01-11 NOTE — Progress Notes (Signed)
Night team progress note:   8:50 pm: Called patient's great granddaughter to updated with chest Ct result. Dr. CT scan shows complete left lung opacity 2/2 consolidation and collapse of the left lung, and associated large pleural effusion. [place] and I explained the CT scan finding and explained the situation and that we are awaiting PCCM rec after CT scan. All her questions and concerns were addressed.  Great granddaughter would like to keep the code status as full code.   I talked to [facility] attending. Will review CT scan for further ecommendation. Patients vital sign stable.  10:44 pm: I talked to pt's nurse to ask for recent status. Pt's doing well, without respiratory distress. She has been on 5Li [place]. VS are stable. Asked RN to keep eye on her.   11:23 pm: RN called me about patient had sudden drop in her O2sat to 55%-60% when respiratory therapist was in the room. She was quickly placed on non rebreathe and O 2 sat improved to 95%.  Dr. Collene Gobble and I went to evaluate the patient. She appears lethargic but arousable. VS stable. O2sat 99% on non rebreather. We updated called El[facility]- PCCM. Dr. Collene Gobble called great grand daughter and updated her. She will come to the hospital. I also talked to her about her respiratory status is declining and she may end up needing intubation. She would like to keep her full code.  We stayed with patient, RN and RT in the room. BP slightly dropped to ~95/70 briefly but then improved on next measures and remained stable at SBP 105-110. MAP stable.  ~11:50. Patient RR decreased. Still has good PR and stable BP. Respiratory support started and activated code blue anticipating respiratory arrest. In about 1 minute, she developed respiratory arrest and immediately got intubated. Dr. Collene Gobble informed great granddaughter and I updated Dr. Lucile Shutters PCCM .  Patient did not lose pulse rate and maintained a good blood pressure during about events, and after  intubation. PCCM team came and evaluated patient and patient transferred to 15M. Great granddaughter updated by Dr. Collene Gobble again and PCCM NP and I, had conversation with her when arrived to ICU. The plan is performing bronch tonight.  Thanks to staff at [facility] for close monitoring and we appreciate continuing care by PCCM team at [facility].  Ellie Olivene Cookston. MD IM PGY-3 Pager [phone]

## 2020-01-11 NOTE — Procedures (Signed)
Thoracentesis  Procedure Note  [name]  [id number]  02-Aug-1923  Date:01/11/20  Time:1:13 PM   Provider Performing:Ananias Kolander   Procedure: Thoracentesis with imaging guidance ([id number])  Indication(s) Pleural Effusion  Consent Risks of the procedure as well as the alternatives and risks of each were explained to the patient and/or caregiver.  Consent for the procedure was obtained and is signed in the bedside chart. Verbal consent obtained from intubated patient   Anesthesia Topical only with 1% lidocaine    Time Out Verified patient identification, verified procedure, site/side was marked, verified correct patient position, special equipment/implants available, medications/allergies/relevant history reviewed, required imaging and test results available.   Sterile Technique Maximal sterile technique including full sterile barrier drape, hand hygiene, sterile gown, sterile gloves, mask, hair covering, sterile ultrasound probe cover (if used).  Procedure Description Ultrasound was used to identify appropriate pleural anatomy for placement and overlying skin marked.  Area of drainage cleaned and draped in sterile fashion. Lidocaine was used to anesthetize the skin and subcutaneous tissue.  800 cc's of straw colored turbid appearing fluid was drained from the left pleural space. Catheter then removed and bandaid applied to site.   Complications/Tolerance left pneumothorax on ultrasound and on chest X-ray.  Chest tube inserted Chest X-ray is ordered to confirm no post-procedural complication.   EBL Minimal   Specimen(s) Pleural fluid sent for cell count, culture, LDH and protein.

## 2020-01-11 NOTE — Consult Note (Signed)
NAME:  [name], MRN:  [id number], DOB:  Apr 07, 1924, LOS: 2 ADMISSION DATE:  01/08/2020, CONSULTATION DATE:  01/10/2020 REFERRING MD: Graciella Freer, CHIEF COMPLAINT:  Resp distress  Brief History   [name] on coumadin for Afib originally admitted to IMTS on 7/5 for symptomatic anemia s/p 1 unit of PRBC. Since admission, she has had increasing O2 requirements and complaints.  On the evening of 7/7, she had worsening hypoxic respiratory failure with imaging showing complete opacification of the left hemithorax.  She unfortunately respiratory arrested, intubated by anesthesia and transferred to ICU.   History of present illness   HPI obtained from medical chart review as patient is currently intubated on mechanical ventilation.   86 year old female with prior history of asthma/ COPD, Afib on warfarin, HTN, asthma, CAD s/p stent, osteomyelitis s/p R BKA (10/26/2019) admitted to IMTS on 7/5 after presenting from home after one week of progressive generalized weakness.   Patient's great granddaughter, Colletta [place], lives and cares for her.  Family has reported gradual cognitive decline over the past year but acutely worsened over the last week as well.   She is followed in our office by Dr. Shearon Stalls for her poorly controlled asthma and COPD, noted with peripheral eosinophilia and elevated IgE previously steroid dependent.  Previous PFTs 07/2018 showing FEV1 57% predicted, moderately severe and previous.  Last office visit in 09/2019, plans were to start her back on Xolair injections (last dose 01/03/2020).    Found to be tachypneic requiring 2L [place] at 97% on admit otherwise hemodynamically stable and afebrile on admit.  PCT negative, no leukocytosis, Hgb 7.1 (down from previous 10.1 in 11/2019).  Admit CXR noted for right basilar atelectasis vs inflitrate and moderate left basilar atelectasis vs infiltrate with bilateral effusion, left > right, chronic in nature.  She was admitted to IMTS for symptomatic anemia s/p  transfusion 1 unit on 7/5.  No obvious sources of bleeding.  Her INR has remained supratherapeutic despite holding coumadin. On 7/6 she developed more respiratory symptoms and increasing O2 requirements.  On 7/7, developed worsening respiratory distress and more O2 requirements and treated with lasix.  CXR repeated which showed complete opacification of the left hemithorax.  A CT chest was performed which complete consolidation and collapse of the left lung with dense material in the bronchial tree likely related to aspiration and/ or mucous plugging, large right and left pleural effusion, and right upper lobe infiltrate and RLL consolidation.  She developed further respiratory distress and PCCM consulted.  On arrival to bedside, patient had unfortunately developed respiratory arrest prior to in which she was intubated by anesthesia without RSI and noted capnography above 100.  She was then transferred to ICU.   Past Medical History  Asthma/ COPD overlap, Afib on warfarin, HTN, HLD, CAD, osteomyelitis s/p R BKA (10/26/2019), anxiety, dysphagia, GERD,   Significant Hospital Events   7/5 Admit 7/7 PCCM consult/ resp arrest- intubated over night  Consults:  PCCM  Procedures:  7/8 ETT 7/8 bronch  Significant Diagnostic Tests:  7/7 CT chest >>  Opacification of the left hemithorax secondary to complete consolidation and collapse of the left lung with associated large pleural effusion. Considerable dense material is noted within the bronchial tree on the left likely related to aspiration and/or mucous plugging. Large right-sided pleural effusion with right upper lobe infiltrate and right lower lobe consolidation. Mild inspissated material is noted in the lower lobe bronchi on right as well. Aneurysmal dilatation of the ascending aorta as  described.  Scattered small likely reactive lymph nodes in the hila and mediastinum.  Micro Data:  7/5 SARS 2 >> neg 7/8 MRSA PCR >>  7/8 BAL  >>  Antimicrobials:  7/7 azithro >> 7/7 ceftriaxone >>   Interim history/subjective:   Objective   Blood pressure 106/84, pulse 90, temperature (!) 94.2 F (34.6 C), temperature source Axillary, resp. rate 16, height _0  (1.676 m), weight 52.2 kg, SpO2 100 %.    Vent Mode: PRVC FiO2 (%):  [100 %] 100 % Set Rate:  [18 bmp] 18 bmp Vt Set:  [470 mL] 470 mL PEEP:  [5 cmH20] 5 cmH20 Plateau Pressure:  [23 cmH20] 23 cmH20  No intake or output data in the 24 hours ending 01/11/20 0045 Filed Weights   01/08/20 1639  Weight: 52.2 kg   Examination: General:  Frail, thin, elderly female lying in bed in NAD, intubated being bagged HEENT: MM pink/moist, pupils 3/reactive, ETT at 20 Neuro: starting to wake up and reach for ETT, moves all extremities, not following commands yet CV: irir, controlled, no murmur PULM:  Being bagged with coarse right upper breath sounds, diminished in right base, absent on left, no wheeze GI: soft, +bs Extremities: warm/dry, trace LE, right BKD Skin: bruising to arms, sacral decub   Resolved Hospital Problem list    Assessment & Plan:   Acute hypoxic respiratory failure - multifactorial in the setting of possible CAP/ aspiration vs atelectasis complicated by likely mucous plugging with enlarging bilateral pleural effusions, possible Asthma/ COPD exacerbation - admit had neg PCT, normal WBC- doubtful for bacterial component P:  Full MV support, PRVC 8 cc/kg, rate 20 Wean FiO2/ PEEP for sat goal > 90% CXR/ ABG in 1 hour Plans to bronch in ICU, send for BAL  Defer diagnostic/ therapeutic thoracentesis for now, hold further diuresis given suspicion for mucous plugging Continue CAP coverage for now duonebs q 6 and prn albuterol  Continue prednisone  Holding breo Continue singulair VAP bundle/ PPI PAD protocol with precedex if needed/ prn fentanyl for RASS goal 0/-1   Hx Dysphagia - on dysphagia 3 with nectar thick in April.  Great granddaughter  states she was placed back on normal diet in rehabl P:  SLP post extubation  Acute on chronic anemia P:  Trending H/H, transfuse for Hgb < 7  Hypovolemic hyponatremia - improving P:  Trend BMP  HTN P:  Hold home metoprolol, currently low normal blood pressure  Afib on home coumadin P:  Tele monitoring Rate currently controlled Holding warfarin   Protein calorie malnutrition  P:  If not extubated today, initiate TF per RD recs   Sacral decub- stage II P:  WOC consult   Questionable developing dementia P:  Further outpatient evaluation   Best practice:  Diet: NPO Pain/Anxiety/Delirium protocol (if indicated): prn precedex gtt/ prn fentanyl for RASS goal 0/-1 VAP protocol (if indicated): yes DVT prophylaxis: SCD GI prophylaxis: PPI Glucose control: SSI Mobility: BR Code Status: Full, at this time family wants full aggressive therapies Family Communication: great granddaughter Colletta [place] [phone] updated on plan of care at bedside.  Her mother, [name] is patient's HPOA [phone] Disposition:   Labs   CBC: Recent Labs  Lab 01/08/20 1651 01/08/20 1651 01/08/20 1715 01/09/20 0205 01/09/20 0255 01/09/20 0535 01/10/20 0233 01/11/20 0010  WBC 6.2  --   --  5.0 4.9  --  5.6 9.0  NEUTROABS 4.9  --   --   --   --   --   --   --  HGB 7.1*   < > 8.2* 7.5* 7.8*  --  8.7* 9.7*  HCT 22.9*   < > 24.0* 24.4* 25.5*  --  27.6* 32.1*  MCV 96.6  --   --  88.1 89.8  --  89.0 91.7  PLT 163   < >  --  136* 118* 134* 133* 168   < > = values in this interval not displayed.    Basic Metabolic Panel: Recent Labs  Lab 01/08/20 1651 01/08/20 1715 01/09/20 0743 01/10/20 0233  NA 126* 131* 130* 130*  K 4.1 4.3 4.1 4.4  CL 93*  --  97* 96*  CO2 25  --  26 26  GLUCOSE 115*  --  87 98  BUN 14  --  11 21  CREATININE 0.77  --  0.75 0.76  CALCIUM 8.4*  --  8.4* 8.5*   GFR: Estimated Creatinine Clearance: 35.4 mL/min (by C-G formula based on SCr of 0.76  mg/dL). Recent Labs  Lab 01/09/20 0205 01/09/20 0255 01/09/20 0523 01/09/20 0535 01/10/20 0233 01/11/20 0010  PROCALCITON  --   --   --  <0.10  --   --   WBC 5.0 4.9  --   --  5.6 9.0  LATICACIDVEN  --   --  0.8  --   --   --     Liver Function Tests: Recent Labs  Lab 01/08/20 1651  AST 25  ALT 17  ALKPHOS 46  BILITOT 0.9  PROT 6.2*  ALBUMIN 3.2*   No results for input(s): LIPASE, AMYLASE in the last 168 hours. No results for input(s): AMMONIA in the last 168 hours.  ABG    Component Value Date/Time   HCO3 28.5 (H) 01/08/2020 1715   TCO2 30 01/08/2020 1715   O2SAT 86.0 01/08/2020 1715     Coagulation Profile: Recent Labs  Lab 01/08/20 1651 01/09/20 0535 01/10/20 0233  INR 3.6* 3.2* 4.0*    Cardiac Enzymes: No results for input(s): CKTOTAL, CKMB, CKMBINDEX, TROPONINI in the last 168 hours.  HbA1C: Hgb A1c MFr Bld  Date/Time Value Ref Range Status  02/24/2011 05:28 AM 5.3 <5.7 % Final    Comment:    (NOTE)                                                                       According to the ADA Clinical Practice Recommendations for 2011, when HbA1c is used as a screening test:  >=6.5%   Diagnostic of Diabetes Mellitus           (if abnormal result is confirmed) 5.7-6.4%   Increased risk of developing Diabetes Mellitus References:Diagnosis and Classification of Diabetes Mellitus,Diabetes AQTM,2263,33(LKTGY 1):S62-S69 and Standards of Medical Care in         Diabetes - 2011,Diabetes BWLS,9373,42 (Suppl 1):S11-S61.    CBG: Recent Labs  Lab 01/09/20 0755 01/09/20 2004 01/11/20 0001 01/11/20 0034  GLUCAP 91 126* 198* 193*    Review of Systems:   unable  Past Medical History  She,  has a past medical history of Anxiety, Arthritis, Asthma, CAD (coronary artery disease) (2007), Constipation, COPD/Asthma, Diverticulosis, Esophageal reflux, Family history of adverse reaction to anesthesia, Glaucoma, HTN (hypertension), benign, Hyperlipidemia,  Malignant melanoma ([place]), Neuropathy, PAF (  paroxysmal atrial fibrillation) ([place]), TIA (transient ischemic attack), and Urinary, incontinence, stress female.   Surgical History    Past Surgical History:  Procedure Laterality Date  . ABDOMINAL HYSTERECTOMY    . AMPUTATION Right 10/26/2019   Procedure: AMPUTATION BELOW KNEE;  Surgeon: Erle Crocker, MD;  Location: [facility];  Service: Orthopedics;  Laterality: Right;  . APPENDECTOMY    . BACK SURGERY     lumbar laminectomy  . CHOLECYSTECTOMY    . COLONOSCOPY    . CORONARY ANGIOPLASTY WITH STENT PLACEMENT    . EYE SURGERY    . NASAL SINUS SURGERY    . VESICOVAGINAL FISTULA CLOSURE W/ TAH    . WEIL OSTEOTOMY Left 06/12/2016   Procedure: Left [place] Left [name], Weil Osteotomy 2nd and 3rd Metatarsal;  Surgeon: Newt Minion, MD;  Location: [facility];  Service: Orthopedics;  Laterality: Left;     Social History   reports that she quit smoking about 53 years ago. Her smoking use included cigarettes. She has a 5.00 pack-year smoking history. She has never used smokeless tobacco. She reports that she does not drink alcohol and does not use drugs.   Family History   Her family history includes Cancer in her sister; Colon cancer in her mother; Heart attack in her brother and father; Heart disease in her brother and father; Hemachromatosis in her father; Lymphoma in her sister; Peripheral vascular disease in her mother and sister.   Allergies Allergies  Allergen Reactions  . Alphagan [Brimonidine] Other (See Comments)    Burning sensation  . Apraclonidine Other (See Comments)    Pain, brow pain, tender, not able to tolerate  . Biaxin [Clarithromycin] Other (See Comments)    Unknown, per pt   . Brovana [Arformoterol] Other (See Comments)    Makes my heart race  . Budesonide Other (See Comments)    Makes my heart race  . Cifenline Itching  . Clonidine Other (See Comments)    unknown  . Clonidine Derivatives Other (See  Comments)    REACTION: unknown  . Dorzolamide Other (See Comments)    Redness  . Iodinated Diagnostic Agents Other (See Comments)    unknown  . Iohexol Other (See Comments)     Desc: NOTES FROM PRIOR CT STATES PRE MEDICATION PRIOR TO OMNIPAQUE ENHANCED CT   . Nsaids Other (See Comments)    Unknown  . Penicillins Itching, Swelling and Other (See Comments)    Swelling and edema of ears and rash Swelling to ears  Has patient had a PCN reaction causing immediate rash, facial/tongue/throat swelling, SOB or lightheadedness with hypotension: {no Has patient had a PCN reaction causing severe rash involving mucus membranes or skin necrosis: {no Has patient had a PCN reaction that required hospitalization no Has patient had a PCN reaction occurring within the last 10 years: no If all of the above answers are "NO", then may proceed with Cephalosporin use.  . Sulfamethoxazole Other (See Comments)    Unknown  . Sulfonamide Derivatives Itching  . Tetracycline Itching and Other (See Comments)    unknown  . Timolol Other (See Comments)    Eye red and irritated   . Vancomycin Other (See Comments)    Turn red  . Zetia [Ezetimibe] Other (See Comments)    Unknown  . Cardizem [Diltiazem Hcl] Rash     Home Medications  Prior to Admission medications   Medication Sig Start Date End Date Taking? Authorizing Provider  acetaminophen (TYLENOL) 325 MG  tablet Take 2 tablets (650 mg total) by mouth every 6 (six) hours as needed for mild pain (or Fever >/= 101). 10/31/19  Yes Elgergawy, Silver Huguenin, MD  albuterol (PROAIR HFA) 108 (90 Base) MCG/ACT inhaler Inhale 1-2 puffs into the lungs every 4 (four) hours as needed for wheezing or shortness of breath. Patient taking differently: Inhale 1-2 puffs into the lungs daily.  12/11/15  Yes Juanito Doom, MD  albuterol (PROVENTIL) (2.5 MG/3ML) 0.083% nebulizer solution Take 3 mLs (2.5 mg total) by nebulization every 6 (six) hours as needed for wheezing or  shortness of breath. Patient taking differently: Take 2.5 mg by nebulization daily.  07/29/18  Yes Juanito Doom, MD  ALPRAZolam Duanne Moron) 0.5 MG tablet Take 1 tablet (0.5 mg total) by mouth 2 (two) times daily as needed for anxiety. 10/31/19  Yes Elgergawy, Silver Huguenin, MD  amLODipine (NORVASC) 10 MG tablet Take 10 mg by mouth daily. 12/03/19  Yes [provider]  cetirizine (ZYRTEC) 10 MG tablet Take 10 mg by mouth daily as needed for allergies.   Yes [provider]  citalopram (CELEXA) 10 MG tablet Take 10 mg by mouth daily.   Yes [provider]  clotrimazole-betamethasone (LOTRISONE) cream Apply 1 application topically 2 (two) times daily as needed for skin breakdown. 07/13/19  Yes [provider]  feeding supplement, ENSURE ENLIVE, (ENSURE ENLIVE) LIQD Take 237 mLs by mouth 3 (three) times daily between meals. Patient taking differently: Take 237 mLs by mouth daily.  10/31/19  Yes Elgergawy, Silver Huguenin, MD  fluticasone (FLONASE) 50 MCG/ACT nasal spray USE 1 SPRAY IN EACH NOSTRIL ONCE A DAY AS NEEDED FOR ALLERGIES OR RHINITIS Patient taking differently: Place 1 spray into both nostrils daily.  04/25/19  Yes Juanito Doom, MD  fluticasone furoate-vilanterol (BREO ELLIPTA) 200-25 MCG/INH AEPB Inhale 1 puff into the lungs daily. 01/27/17  Yes Juanito Doom, MD  furosemide (LASIX) 20 MG tablet Take 1 tablet (20 mg total) by mouth daily. 11/01/19  Yes Elgergawy, Silver Huguenin, MD  latanoprost (XALATAN) 0.005 % ophthalmic solution Place 1 drop into both eyes at bedtime.   Yes [provider]  lisinopril (ZESTRIL) 40 MG tablet Take 40 mg by mouth daily. 12/29/19  Yes [provider]  metoprolol tartrate (LOPRESSOR) 25 MG tablet Take 1 tablet (25 mg total) by mouth 4 (four) times daily. 10/31/19  Yes Elgergawy, Silver Huguenin, MD  mirtazapine (REMERON SOL-TAB) 15 MG disintegrating tablet Take 1 tablet (15 mg total) by mouth at bedtime. 10/31/19 10/30/20 Yes  Elgergawy, Silver Huguenin, MD  montelukast (SINGULAIR) 10 MG tablet TAKE 1 TABLET BY MOUTH EVERYDAY AT BEDTIME Patient taking differently: Take 10 mg by mouth at bedtime.  05/29/19  Yes Martyn Ehrich, NP  Multiple Vitamins-Minerals (MULTIVITAMIN & MINERAL PO) Take 1 tablet by mouth daily.   Yes [provider]  omalizumab [name]) 150 MG/ML prefilled syringe Inject 300 mg into the skin every 14 (fourteen) days. 01/01/20  Yes Spero Geralds, MD  omalizumab [name]) 75 MG/0.5ML prefilled syringe Inject 75 mg into the skin every 14 (fourteen) days. 01/01/20  Yes Spero Geralds, MD  pilocarpine (PILOCAR) 1 % ophthalmic solution Place 1 drop into both eyes 3 (three) times daily.  12/30/17  Yes [provider]  warfarin (COUMADIN) 2 MG tablet Take 1 tablet (2 mg total) by mouth daily. Start 11/02/2019 Patient taking differently: Take 2 mg by mouth in the morning and at bedtime. 18m am  258mpm.  11/02/19 11/01/20 Yes Elgergawy, Silver Huguenin, MD  loperamide (IMODIUM A-D) 2 MG tablet Take 2 mg by mouth daily as needed for diarrhea or loose stools. Patient not taking: Reported on 01/08/2020    [provider]     Critical care time: 60 mins     Kennieth Rad, MSN, AGACNP-BC [facility] 01/11/2020, 3:08 AM  See Shea Evans for personal pager PCCM on call pager [phone]

## 2020-01-11 NOTE — Progress Notes (Signed)
CPT held at this time. Patient having chest tube placed for new pneumothorax.

## 2020-01-11 NOTE — Anesthesia Procedure Notes (Signed)
Procedure Name: Intubation Date/Time: 01/11/2020 12:05 AM Performed by: Suzy Bouchard, CRNA Pre-anesthesia Checklist: Patient identified, Emergency Drugs available, Suction available and Patient being monitored Patient Re-evaluated:Patient Re-evaluated prior to induction Oxygen Delivery Method: Ambu bag Laryngoscope Size: Glidescope and 3 Grade View: Grade I Tube type: Oral Tube size: 7.5 mm Number of attempts: 1 Airway Equipment and Method: Video-laryngoscopy and Stylet Placement Confirmation: ETT inserted through vocal cords under direct vision,  positive ETCO2 and breath sounds checked- equal and bilateral Secured at: 20 cm Tube secured with: Tape Dental Injury: Teeth and Oropharynx as per pre-operative assessment

## 2020-01-11 NOTE — Progress Notes (Signed)
[facility] Progress Note Patient Name: [name] DOB: 1923/08/17 MRN: [id number]   Date of Service  01/11/2020  HPI/Events of Note  Patient is at high risk of self extubation.  eICU Interventions  Bilateral soft wrist restraints ordered.        Kerry Kass [name] 01/11/2020, 6:25 AM

## 2020-01-11 NOTE — Progress Notes (Signed)
Initial Nutrition Assessment  DOCUMENTATION CODES:   Not applicable  INTERVENTION:   Tube Feeding via OG tube: Vital AF 1.2 at 45 ml/hr Provides 81 g of protein, 1296 kcals and 875 mL of free water Meets 100% estimated calorie and protein needs  NUTRITION DIAGNOSIS:   Inadequate oral intake related to acute illness as evidenced by NPO status.  GOAL:   Patient will meet greater than or equal to 90% of their needs  MONITOR:   Vent status, Labs, Weight trends, TF tolerance, Skin  REASON FOR ASSESSMENT:   Ventilator    ASSESSMENT:   86 yo female admitted with acute on chronic anemia, developed respiratory failure requiring intubation. PMH includes COPD, HTN, asthma, CAD, osteomyelitis s/p R BKA (10/2019), hx of dysphagia on thickened liquids  7/05 Admit with symptomatic anemia 7/07 Worsening respiratory failure, Intubated, transferred to ICU 7/08 Bronch, CT placed  Patient is currently intubated on ventilator support MV: 8.6 L/min Temp (24hrs), Avg:96.6 F (35.9 C), Min:94.2 F (34.6 C), Max:98.7 F (37.1 C)  Propofol: NONE  OG tube with tip in stomach  Pt with hx of dysphagia, noted plan for SLP post extubation  Stage II wound on sacrum with non-pressure related wound on L. Foot. Pt with recent R. BKA as well  Current weight: 56 kg Admit weight: 52 kg  Labs: reviewed Meds: reviewed  Diet Order:   Diet Order            Diet NPO time specified  Diet effective now                 EDUCATION NEEDS:   Not appropriate for education at this time  Skin:  Skin Assessment: Skin Integrity Issues: Skin Integrity Issues:: Stage II, Other (Comment) Stage II: sacrum Other: non pressure related wound on foot  Last BM:  7/06  Height:   Ht Readings from Last 1 Encounters:  01/11/20 5\' 6"  (1.676 m)    Weight:   Wt Readings from Last 1 Encounters:  01/11/20 56 kg    BMI:  Body mass index is 19.93 kg/m.  Estimated Nutritional Needs:   Kcal:  1300  kcals  Protein:  70-85 g  Fluid:  >/= 1.3 L   Kerman Passey MS, RDN, LDN, CNSC Registered Dietitian III Clinical Nutrition RD Pager and On-Call Pager Number Located in [place]

## 2020-01-11 NOTE — Consult Note (Signed)
NAME:  [name], MRN:  [id number], DOB:  [birth year], LOS: 2 ADMISSION DATE:  01/08/2020, CONSULTATION DATE:  01/10/2020 REFERRING MD: Graciella Freer, CHIEF COMPLAINT:  Resp distress  Brief History   [name] on coumadin for Afib originally admitted to IMTS on 7/5 for symptomatic anemia s/p 1 unit of PRBC. Since admission, she has had increasing O2 requirements and complaints.  On the evening of 7/7, she had worsening hypoxic respiratory failure with imaging showing complete opacification of the left hemithorax.  She unfortunately respiratory arrested, intubated by anesthesia and transferred to ICU.   History of present illness   HPI obtained from medical chart review as patient is currently intubated on mechanical ventilation.   86 year old female with prior history of asthma/ COPD, Afib on warfarin, HTN, asthma, CAD s/p stent, osteomyelitis s/p R BKA (10/26/2019) admitted to IMTS on 7/5 after presenting from home after one week of progressive generalized weakness.   Patient's great granddaughter, [name], lives and cares for her.  Family has reported gradual cognitive decline over the past year but acutely worsened over the last week as well.   She is followed in our office by Dr. Shearon Stalls for her poorly controlled asthma and COPD, noted with peripheral eosinophilia and elevated IgE previously steroid dependent.  Previous PFTs 07/2018 showing FEV1 57% predicted, moderately severe and previous.  Last office visit in 09/2019, plans were to start her back on Xolair injections (last dose 01/03/2020).    Found to be tachypneic requiring 2L [place] at 97% on admit otherwise hemodynamically stable and afebrile on admit.  PCT negative, no leukocytosis, Hgb 7.1 (down from previous 10.1 in 11/2019).  Admit CXR noted for right basilar atelectasis vs inflitrate and moderate left basilar atelectasis vs infiltrate with bilateral effusion, left > right, chronic in nature.  She was admitted to IMTS for symptomatic anemia s/p  transfusion 1 unit on 7/5.  No obvious sources of bleeding.  Her INR has remained supratherapeutic despite holding coumadin. On 7/6 she developed more respiratory symptoms and increasing O2 requirements.  On 7/7, developed worsening respiratory distress and more O2 requirements and treated with lasix.  CXR repeated which showed complete opacification of the left hemithorax.  A CT chest was performed which complete consolidation and collapse of the left lung with dense material in the bronchial tree likely related to aspiration and/ or mucous plugging, large right and left pleural effusion, and right upper lobe infiltrate and RLL consolidation.  She developed further respiratory distress and PCCM consulted.  On arrival to bedside, patient had unfortunately developed respiratory arrest prior to in which she was intubated by anesthesia without RSI and noted capnography above 100.  She was then transferred to ICU.   Past Medical History  Asthma/ COPD overlap, Afib on warfarin, HTN, HLD, CAD, osteomyelitis s/p R BKA (10/26/2019), anxiety, dysphagia, GERD,   Significant Hospital Events   7/5 Admit 7/7 PCCM consult/ resp arrest- intubated over night  Consults:  PCCM  Procedures:  7/8 ETT 7/8 bronch  Significant Diagnostic Tests:  7/7 CT chest >>  Opacification of the left hemithorax secondary to complete consolidation and collapse of the left lung with associated large pleural effusion. Considerable dense material is noted within the bronchial tree on the left likely related to aspiration and/or mucous plugging. Large right-sided pleural effusion with right upper lobe infiltrate and right lower lobe consolidation. Mild inspissated material is noted in the lower lobe bronchi on right as well. Aneurysmal dilatation of the ascending aorta as  described.  Scattered small likely reactive lymph nodes in the hila and mediastinum.  Micro Data:  7/5 SARS 2 >> neg 7/8 MRSA PCR >>  7/8 BAL  >>  Antimicrobials:  7/7 azithro >> 7/7 ceftriaxone >>   Interim history/subjective:   Objective   Blood pressure (!) 98/55, pulse 97, temperature (!) 97.2 F (36.2 C), temperature source Axillary, resp. rate 18, height _0  (1.676 m), weight 56 kg, SpO2 100 %.    Vent Mode: PRVC FiO2 (%):  [40 %-100 %] 40 % Set Rate:  [18 bmp] 18 bmp Vt Set:  [470 mL] 470 mL PEEP:  [5 cmH20] 5 cmH20 Pressure Support:  [12 cmH20] 12 cmH20 Plateau Pressure:  [18 cmH20-32 cmH20] 18 cmH20   Intake/Output Summary (Last 24 hours) at 01/11/2020 1331 Last data filed at 01/11/2020 1000 Gross per 24 hour  Intake 350 ml  Output 1450 ml  Net -1100 ml   Filed Weights   01/08/20 1639 01/11/20 0500  Weight: 52.2 kg 56 kg   Examination: General:  Frail, thin, elderly female lying in bed in NAD, intubated being bagged HEENT: MM pink/moist, pupils 3/reactive, ETT at 20 Neuro: starting to wake up and reach for ETT, moves all extremities, not following commands yet CV: irir, controlled, no murmur PULM:  Being bagged with coarse right upper breath sounds, diminished in right base, absent on left, no wheeze GI: soft, +bs Extremities: warm/dry, trace LE, right BKD Skin: bruising to arms, sacral decub   Resolved Hospital Problem list    Assessment & Plan:   Critically ill due to Acute hypoxic respiratory failure requiring mechanical ventilation. - Continue current lung protective ventilation support. -Commence SBT in a.m.  Etiology respiratory failure unclear, multifactorial in the setting of possible CAP/ aspiration vs atelectasis complicated by likely mucous plugging with enlarging bilateral pleural effusions, possible Asthma/ COPD exacerbation Mucous plugging resolved following bronchoscopy Tapped left pleural effusion. Chest tube placed for post thoracentesis pneumothorax. -Continue bronchodilators. -Continue CAP coverage.  Hx Dysphagia - on dysphagia 3 with nectar thick in April.  Great  granddaughter states she was placed back on normal diet in rehabl  -SLP post extubation  Acute on chronic anemia -Trending H/H, transfuse for Hgb < 7  Afib on home coumadin -Tele monitoring -Rate currently controlled -Holding warfarin   Protein calorie malnutrition  -If not extubated today, initiate TF per RD recs   Sacral decub- stage II -WOC consult   Questionable developing dementia -Further outpatient evaluation   Best practice:  Diet: NPO high nutritional risk we will start tube feeds today. Pain/Anxiety/Delirium protocol (if indicated): prn precedex gtt/ prn fentanyl for RASS goal 0/-1 VAP protocol (if indicated): yes DVT prophylaxis: SCD GI prophylaxis: PPI Glucose control: SSI Mobility: BR Code Status: Full, at this time family wants full aggressive therapies Family Communication: great granddaughter [name] [phone] updated on plan of care at bedside.  Her mother, Warren Lacy is patient's HPOA [phone] Disposition: ICU.  Labs   CBC: Recent Labs  Lab 01/08/20 1651 01/08/20 1715 01/09/20 0205 01/09/20 0205 01/09/20 0255 01/09/20 0535 01/10/20 0233 01/11/20 0010 01/11/20 0322 01/11/20 0337  WBC 6.2   < > 5.0  --  4.9  --  5.6 9.0  --  7.3  NEUTROABS 4.9  --   --   --   --   --   --   --   --   --   HGB 7.1*   < > 7.5*   < > 7.8*  --  8.7* 9.7* 9.5* 9.3*  HCT 22.9*   < > 24.4*   < > 25.5*  --  27.6* 32.1* 28.0* 29.9*  MCV 96.6   < > 88.1  --  89.8  --  89.0 91.7  --  89.0  PLT 163   < > 136*   < > 118* 134* 133* 168  --  150   < > = values in this interval not displayed.    Basic Metabolic Panel: Recent Labs  Lab 01/08/20 1651 01/08/20 1651 01/08/20 1715 01/09/20 0743 01/10/20 0233 01/11/20 0322 01/11/20 0337  NA 126*   < > 131* 130* 130* 133* 134*  K 4.1   < > 4.3 4.1 4.4 3.9 4.3  CL 93*  --   --  97* 96*  --  95*  CO2 25  --   --  26 26  --  27  GLUCOSE 115*  --   --  87 98  --  136*  BUN 14  --   --  11 21  --  24*  CREATININE 0.77   --   --  0.75 0.76  --  0.94  CALCIUM 8.4*  --   --  8.4* 8.5*  --  8.7*   < > = values in this interval not displayed.   GFR: Estimated Creatinine Clearance: 32.4 mL/min (by C-G formula based on SCr of 0.94 mg/dL). Recent Labs  Lab 01/09/20 0255 01/09/20 0523 01/09/20 0535 01/10/20 0233 01/11/20 0010 01/11/20 0337  PROCALCITON  --   --  <0.10  --   --   --   WBC 4.9  --   --  5.6 9.0 7.3  LATICACIDVEN  --  0.8  --   --   --   --     Liver Function Tests: Recent Labs  Lab 01/08/20 1651  AST 25  ALT 17  ALKPHOS 46  BILITOT 0.9  PROT 6.2*  ALBUMIN 3.2*   No results for input(s): LIPASE, AMYLASE in the last 168 hours. No results for input(s): AMMONIA in the last 168 hours.  ABG    Component Value Date/Time   PHART 7.554 (H) 01/11/2020 0322   PCO2ART 38.9 01/11/2020 0322   PO2ART 456 (H) 01/11/2020 0322   HCO3 34.4 (H) 01/11/2020 0322   TCO2 36 (H) 01/11/2020 0322   O2SAT 100.0 01/11/2020 0322     Coagulation Profile: Recent Labs  Lab 01/08/20 1651 01/09/20 0535 01/10/20 0233 01/11/20 0923  INR 3.6* 3.2* 4.0* 2.7*    Cardiac Enzymes: No results for input(s): CKTOTAL, CKMB, CKMBINDEX, TROPONINI in the last 168 hours.  HbA1C: Hgb A1c MFr Bld  Date/Time Value Ref Range Status  02/24/2011 05:28 AM 5.3 <5.7 % Final    Comment:    (NOTE)                                                                       According to the ADA Clinical Practice Recommendations for 2011, when HbA1c is used as a screening test:  >=6.5%   Diagnostic of Diabetes Mellitus           (if abnormal result is confirmed) 5.7-6.4%   Increased risk of developing Diabetes Mellitus References:Diagnosis and Classification  of Diabetes Mellitus,Diabetes [id number],8088,11(SRPRX 1):S62-S69 and Standards of Medical Care in         Diabetes - 2011,Diabetes YVOP,9292,44 (Suppl 1):S11-S61.    CBG: Recent Labs  Lab 01/09/20 0755 01/09/20 2004 01/11/20 0001 01/11/20 0034 01/11/20 0343  GLUCAP  91 126* 198* 193* 122*   CRITICAL CARE Performed by: Kipp Brood   Total critical care time: 40 minutes  Critical care time was exclusive of separately billable procedures and treating other patients.  Critical care was necessary to treat or prevent imminent or life-threatening deterioration.  Critical care was time spent personally by me on the following activities: development of treatment plan with patient and/or surrogate as well as nursing, discussions with consultants, evaluation of patient's response to treatment, examination of patient, obtaining history from patient or surrogate, ordering and performing treatments and interventions, ordering and review of laboratory studies, ordering and review of radiographic studies, pulse oximetry, re-evaluation of patient's condition and participation in multidisciplinary rounds.  Kipp Brood, MD [facility] ICU Physician [place]  Pager: [phone] Mobile: [phone] After hours: [phone].

## 2020-01-11 NOTE — Progress Notes (Signed)
CODE BLUE NOTE  Patient Name: [name]   MRN: [id number]   Date of Birth/ Sex: 1924/01/24 , female      Admission Date: 01/08/2020  Attending Provider: Velna Ochs, MD  Primary Diagnosis: Acute respiratory failure with hypoxia [facility])    Indication: Pt was in her usual state of health until this PM, when she was noted to be in respiratory distress. Code blue was subsequently called. At the time of arrival on scene, ACLS protocol was underway.    Technical Description:  - CPR performance duration:  0 minutes  - Was defibrillation or cardioversion used? No   - Was external pacer placed? No  - Was patient intubated pre/post CPR? Yes    Medications Administered: Y = Yes; Blank = No Patient did not receive any medications.   Post CPR evaluation:  - Final Status - Was patient successfully resuscitated ? Yes - What is current hemodynamic status? Stable   Miscellaneous Information:  - Labs sent, including: CBC, ABG  - Primary team notified?  Yes  - Family Notified? Yes   I was notified through page of code blue on patient at 11:53 pm. Patient was intubated at 12:05 am. She maintained pulse rate and stable BP.        Donney Dice, DO  01/11/2020, 4:44 AM

## 2020-01-12 ENCOUNTER — Inpatient Hospital Stay (HOSPITAL_COMMUNITY): Payer: Medicare HMO

## 2020-01-12 LAB — PROTIME-INR
INR: 2 — ABNORMAL HIGH (ref 0.8–1.2)
Prothrombin Time: 21.9 seconds — ABNORMAL HIGH (ref 11.4–15.2)

## 2020-01-12 LAB — GLUCOSE, CAPILLARY
Glucose-Capillary: 109 mg/dL — ABNORMAL HIGH (ref 70–99)
Glucose-Capillary: 121 mg/dL — ABNORMAL HIGH (ref 70–99)
Glucose-Capillary: 134 mg/dL — ABNORMAL HIGH (ref 70–99)
Glucose-Capillary: 119 mg/dL — ABNORMAL HIGH (ref 70–99)
Glucose-Capillary: 111 mg/dL — ABNORMAL HIGH (ref 70–99)
Glucose-Capillary: 110 mg/dL — ABNORMAL HIGH (ref 70–99)

## 2020-01-12 LAB — PHOSPHORUS
Phosphorus: 1.6 mg/dL — ABNORMAL LOW (ref 2.5–4.6)
Phosphorus: 5.7 mg/dL — ABNORMAL HIGH (ref 2.5–4.6)

## 2020-01-12 LAB — BODY FLUID CELL COUNT WITH DIFFERENTIAL
Eos, Fluid: 0 %
Lymphs, Fluid: 87 %
Monocyte-Macrophage-Serous Fluid: 8 % — ABNORMAL LOW (ref 50–90)
Neutrophil Count, Fluid: 5 % (ref 0–25)
Total Nucleated Cell Count, Fluid: 261 cu mm (ref 0–1000)

## 2020-01-12 LAB — MAGNESIUM
Magnesium: 1.6 mg/dL — ABNORMAL LOW (ref 1.7–2.4)
Magnesium: 2.8 mg/dL — ABNORMAL HIGH (ref 1.7–2.4)

## 2020-01-12 LAB — PATHOLOGIST SMEAR REVIEW

## 2020-01-12 MED ORDER — POTASSIUM PHOSPHATES 15 MMOLE/5ML IV SOLN
45.0000 mmol | Freq: Once | INTRAVENOUS | Status: AC
  Administered 2020-01-12: 45 mmol via INTRAVENOUS
  Filled 2020-01-12: qty 15

## 2020-01-12 MED ORDER — WARFARIN - PHARMACIST DOSING INPATIENT: Freq: Every day | Status: DC

## 2020-01-12 MED ORDER — WARFARIN SODIUM 2.5 MG PO TABS
2.5000 mg | ORAL_TABLET | Freq: Once | ORAL | Status: AC
  Administered 2020-01-12: 2.5 mg via ORAL
  Filled 2020-01-12: qty 1

## 2020-01-12 MED ORDER — SODIUM CHLORIDE 0.9 % IV BOLUS
500.0000 mL | Freq: Once | INTRAVENOUS | Status: AC
  Administered 2020-01-12: 500 mL via INTRAVENOUS

## 2020-01-12 MED ORDER — MAGNESIUM SULFATE 4 GM/100ML IV SOLN
4.0000 g | Freq: Once | INTRAVENOUS | Status: AC
  Administered 2020-01-12: 4 g via INTRAVENOUS
  Filled 2020-01-12: qty 100

## 2020-01-12 NOTE — Progress Notes (Signed)
ANTICOAGULATION CONSULT NOTE - Initial Consult  Pharmacy Consult:  Coumadin Indication: atrial fibrillation  Allergies  Allergen Reactions  . Alphagan [Brimonidine] Other (See Comments)    Burning sensation  . Apraclonidine Other (See Comments)    Pain, brow pain, tender, not able to tolerate  . Biaxin [Clarithromycin] Other (See Comments)    Unknown, per pt   . Brovana [Arformoterol] Other (See Comments)    Makes my heart race  . Budesonide Other (See Comments)    Makes my heart race  . Cifenline Itching  . Clonidine Other (See Comments)    unknown  . Clonidine Derivatives Other (See Comments)    REACTION: unknown  . Dorzolamide Other (See Comments)    Redness  . Iodinated Diagnostic Agents Other (See Comments)    unknown  . Iohexol Other (See Comments)     Desc: NOTES FROM PRIOR CT STATES PRE MEDICATION PRIOR TO OMNIPAQUE ENHANCED CT   . Nsaids Other (See Comments)    Unknown  . Penicillins Itching, Swelling and Other (See Comments)    Swelling and edema of ears and rash Swelling to ears  Has patient had a PCN reaction causing immediate rash, facial/tongue/throat swelling, SOB or lightheadedness with hypotension: {no Has patient had a PCN reaction causing severe rash involving mucus membranes or skin necrosis: {no Has patient had a PCN reaction that required hospitalization no Has patient had a PCN reaction occurring within the last 10 years: no If all of the above answers are "NO", then may proceed with Cephalosporin use.  . Sulfamethoxazole Other (See Comments)    Unknown  . Sulfonamide Derivatives Itching  . Tetracycline Itching and Other (See Comments)    unknown  . Timolol Other (See Comments)    Eye red and irritated   . Vancomycin Other (See Comments)    Turn red  . Zetia [Ezetimibe] Other (See Comments)    Unknown  . Cardizem [Diltiazem Hcl] Rash    Patient Measurements: Height: 5\' 6"  (167.6 cm) Weight: 56 kg (123 lb 7.3 oz) IBW/kg (Calculated) :  59.3  Vital Signs: Temp: 97.4 F (36.3 C) (07/09 1111) Temp Source: Axillary (07/09 1111) BP: 82/48 (07/09 1204) Pulse Rate: 100 (07/09 1225)  Labs: Recent Labs    01/10/20 0233 01/10/20 0233 01/11/20 0010 01/11/20 0010 01/11/20 0322 01/11/20 0337 01/11/20 0923 01/12/20 0358  HGB 8.7*   < > 9.7*   < > 9.5* 9.3*  --   --   HCT 27.6*   < > 32.1*  --  28.0* 29.9*  --   --   PLT 133*  --  168  --   --  150  --   --   LABPROT 37.7*  --   --   --   --   --  27.6* 21.9*  INR 4.0*  --   --   --   --   --  2.7* 2.0*  CREATININE 0.76  --   --   --   --  0.94  --   --    < > = values in this interval not displayed.    Estimated Creatinine Clearance: 32.4 mL/min (by C-G formula based on SCr of 0.94 mg/dL).   Medical History: Past Medical History:  Diagnosis Date  . Anxiety   . Arthritis   . Asthma   . CAD (coronary artery disease) 2007   a. 1997 s/p PCI of diagonal.  . Constipation   . COPD/Asthma   . Diverticulosis   .  Esophageal reflux   . Family history of adverse reaction to anesthesia    daughter had trouble breathing after surgery, had to be reintubated  . Glaucoma   . HTN (hypertension), benign   . Hyperlipidemia   . Malignant melanoma ([place])    a. 09/2012 s/p resection.  . Neuropathy    lower legs  . PAF (paroxysmal atrial fibrillation) (HCC)    a. intolerant to beta blockers due to asthma and intolerant to CCB due to rash;  b. CHA2DS2VASc = 6-->chronic coumadin;  c. 03/2014 Echo: EF 60-65%, Gr 1 DD, triv AI, mildly dil RV, PASP 20mmhg.  [name] TIA (transient ischemic attack)    a. 02/2011 - chronic coumadin in setting of PAF.  [name] Urinary, incontinence, stress female     Assessment: 74 YOF presented with SOB and confusion, found to have left lung collapse and pleural effusion now s/p bronch, thoracentesis and chest tube insertion.  Patient has a history of Afib (CHADSVASC = 6) on Coumadin PTA.  INR elevated on admission (uo to 4), now trended down to 2 and Pharmacy  consulted to resume Coumadin.  No bleeding reported.  Last Coumadin dose was on 01/08/20.  There is discrepancy regarding home Coumadin regimen and how patient was taking it.  Home regimen listed as 2mg  PO daily and patient might have been taking 2mg  PO BID.  Goal of Therapy:  INR 2-3 Monitor platelets by anticoagulation protocol: Yes   Plan:  Coumadin 2.5mg  PO today d/t rate of INR drop Daily PT / INR Verify home Coumadin regimen with patient when possible  Sharon Rubis D. Mina Marble, PharmD, BCPS, [place] 01/12/2020, 1:08 PM

## 2020-01-12 NOTE — Telephone Encounter (Signed)
Spoke with [employer]. They were needing the pt's most recent weight and IgE level. This information has been given to them. Nothing further was needed.

## 2020-01-12 NOTE — Progress Notes (Signed)
CPT held at this time.  Labs are being drawn.

## 2020-01-12 NOTE — Discharge Instructions (Addendum)
You were hospitalized for respiratory failure. Thank you for allowing [place] to be part of your care.   Please make sure to follow-up with your primary physician to review medications within the next two-three weeks.   Please note these changes made to your medications:   Please STOP:   Stop Lisinopril Stop Amlodipine Stop Lasix - if you notice lower extremity swelling, increased shortness of breath please call the primary physician to discuss the risks and benefits of restarting this medication  STOP WARFARIN - this medication is being changed to another blood thinner that will be safer for the patient   Please START   Eliquis (apixaban) 2.5 mg - take 1 tablet by mouth two times daily with meals   Ipratropium-albuterol (DUONEB) - use nebulizer every 6 hours as needed for shortness of breath   Pantoprazole (PROTONIX) - take 1 tablet by mouth prior to breakfast   Please CHANGE how you take:  Metoprolol 25mg  - take 2 tablets by mouth two times per day.

## 2020-01-13 DIAGNOSIS — J95811 Postprocedural pneumothorax: Secondary | ICD-10-CM

## 2020-01-13 DIAGNOSIS — J189 Pneumonia, unspecified organism: Secondary | ICD-10-CM

## 2020-01-13 DIAGNOSIS — J449 Chronic obstructive pulmonary disease, unspecified: Secondary | ICD-10-CM

## 2020-01-13 LAB — PROTIME-INR
INR: 1.6 — ABNORMAL HIGH (ref 0.8–1.2)
Prothrombin Time: 18.2 seconds — ABNORMAL HIGH (ref 11.4–15.2)

## 2020-01-13 LAB — GLUCOSE, CAPILLARY
Glucose-Capillary: 104 mg/dL — ABNORMAL HIGH (ref 70–99)
Glucose-Capillary: 102 mg/dL — ABNORMAL HIGH (ref 70–99)
Glucose-Capillary: 116 mg/dL — ABNORMAL HIGH (ref 70–99)
Glucose-Capillary: 102 mg/dL — ABNORMAL HIGH (ref 70–99)

## 2020-01-13 MED ORDER — ALPRAZOLAM 0.5 MG PO TABS: 0.5000 mg | ORAL_TABLET | Freq: Every evening | ORAL | Status: DC | PRN

## 2020-01-13 MED ORDER — ACETAMINOPHEN 325 MG PO TABS
650.0000 mg | ORAL_TABLET | Freq: Four times a day (QID) | ORAL | Status: DC | PRN
  Administered 2020-01-16 – 2020-01-17 (×2): 650 mg
  Filled 2020-01-13 (×2): qty 2

## 2020-01-13 MED ORDER — SENNOSIDES-DOCUSATE SODIUM 8.6-50 MG PO TABS: 1.0000 | ORAL_TABLET | Freq: Every evening | ORAL | Status: DC | PRN

## 2020-01-13 MED ORDER — POTASSIUM CHLORIDE 20 MEQ/15ML (10%) PO SOLN
40.0000 meq | Freq: Once | ORAL | Status: AC
  Administered 2020-01-13: 40 meq
  Filled 2020-01-13: qty 30

## 2020-01-13 MED ORDER — ACETAMINOPHEN 650 MG RE SUPP: 650.0000 mg | Freq: Four times a day (QID) | RECTAL | Status: DC | PRN

## 2020-01-13 MED ORDER — MONTELUKAST SODIUM 10 MG PO TABS
10.0000 mg | ORAL_TABLET | Freq: Every day | ORAL | Status: DC
  Administered 2020-01-13: 10 mg

## 2020-01-13 MED ORDER — FUROSEMIDE 10 MG/ML IJ SOLN
40.0000 mg | Freq: Once | INTRAMUSCULAR | Status: AC
  Administered 2020-01-13: 40 mg via INTRAVENOUS
  Filled 2020-01-13: qty 4

## 2020-01-13 MED ORDER — DOCUSATE SODIUM 50 MG/5ML PO LIQD
100.0000 mg | Freq: Two times a day (BID) | ORAL | Status: DC
  Filled 2020-01-13 (×2): qty 10

## 2020-01-13 MED ORDER — POLYETHYLENE GLYCOL 3350 17 G PO PACK
17.0000 g | PACK | Freq: Every day | ORAL | Status: DC
  Filled 2020-01-13 (×2): qty 1

## 2020-01-13 MED ORDER — CITALOPRAM HYDROBROMIDE 10 MG PO TABS
10.0000 mg | ORAL_TABLET | Freq: Every day | ORAL | Status: DC
  Filled 2020-01-13 (×2): qty 1

## 2020-01-13 MED ORDER — GUAIFENESIN 100 MG/5ML PO SOLN: 10.0000 mL | ORAL | Status: DC | PRN

## 2020-01-13 MED ORDER — WARFARIN SODIUM 5 MG PO TABS
5.0000 mg | ORAL_TABLET | Freq: Once | ORAL | Status: AC
  Administered 2020-01-13: 5 mg via ORAL
  Filled 2020-01-13: qty 1

## 2020-01-13 NOTE — Progress Notes (Signed)
Spoke with patient daughter, Warren Lacy, and Granddaughter, Colletta [place], about patient status and plan of care. All questions answered at this time.

## 2020-01-13 NOTE — Progress Notes (Signed)
ANTICOAGULATION CONSULT NOTE - Consult  Pharmacy Consult:  Coumadin Indication: atrial fibrillation  Allergies  Allergen Reactions  . Alphagan [Brimonidine] Other (See Comments)    Burning sensation  . Apraclonidine Other (See Comments)    Pain, brow pain, tender, not able to tolerate  . Biaxin [Clarithromycin] Other (See Comments)    Unknown, per pt   . Brovana [Arformoterol] Other (See Comments)    Makes my heart race  . Budesonide Other (See Comments)    Makes my heart race  . Cifenline Itching  . Clonidine Other (See Comments)    unknown  . Clonidine Derivatives Other (See Comments)    REACTION: unknown  . Dorzolamide Other (See Comments)    Redness  . Iodinated Diagnostic Agents Other (See Comments)    unknown  . Iohexol Other (See Comments)     Desc: NOTES FROM PRIOR CT STATES PRE MEDICATION PRIOR TO OMNIPAQUE ENHANCED CT   . Nsaids Other (See Comments)    Unknown  . Penicillins Itching, Swelling and Other (See Comments)    Swelling and edema of ears and rash Swelling to ears  Has patient had a PCN reaction causing immediate rash, facial/tongue/throat swelling, SOB or lightheadedness with hypotension: {no Has patient had a PCN reaction causing severe rash involving mucus membranes or skin necrosis: {no Has patient had a PCN reaction that required hospitalization no Has patient had a PCN reaction occurring within the last 10 years: no If all of the above answers are "NO", then may proceed with Cephalosporin use.  . Sulfamethoxazole Other (See Comments)    Unknown  . Sulfonamide Derivatives Itching  . Tetracycline Itching and Other (See Comments)    unknown  . Timolol Other (See Comments)    Eye red and irritated   . Vancomycin Other (See Comments)    Turn red  . Zetia [Ezetimibe] Other (See Comments)    Unknown  . Cardizem [Diltiazem Hcl] Rash    Patient Measurements: Height: 5\' 6"  (167.6 cm) Weight: 56.9 kg (125 lb 7.1 oz) IBW/kg (Calculated) :  59.3  Vital Signs: Temp: 98.9 F (37.2 C) (07/10 0700) Temp Source: Axillary (07/10 0700) BP: 103/61 (07/10 1103) Pulse Rate: 102 (07/10 1125)  Labs: Recent Labs    01/11/20 0010 01/11/20 0010 01/11/20 0322 01/11/20 0337 01/11/20 0923 01/12/20 0358 01/13/20 0339  HGB 9.7*   < > 9.5* 9.3*  --   --   --   HCT 32.1*  --  28.0* 29.9*  --   --   --   PLT 168  --   --  150  --   --   --   LABPROT  --   --   --   --  27.6* 21.9* 18.2*  INR  --   --   --   --  2.7* 2.0* 1.6*  CREATININE  --   --   --  0.94  --   --   --    < > = values in this interval not displayed.    Estimated Creatinine Clearance: 32.9 mL/min (by C-G formula based on SCr of 0.94 mg/dL).   Medical History: Past Medical History:  Diagnosis Date  . Anxiety   . Arthritis   . Asthma   . CAD (coronary artery disease) 2007   a. 1997 s/p PCI of diagonal.  . Constipation   . COPD/Asthma   . Diverticulosis   . Esophageal reflux   . Family history of adverse reaction to anesthesia  daughter had trouble breathing after surgery, had to be reintubated  . Glaucoma   . HTN (hypertension), benign   . Hyperlipidemia   . Malignant melanoma ([place])    a. 09/2012 s/p resection.  . Neuropathy    lower legs  . PAF (paroxysmal atrial fibrillation) (HCC)    a. intolerant to beta blockers due to asthma and intolerant to CCB due to rash;  b. CHA2DS2VASc = 6-->chronic coumadin;  c. 03/2014 Echo: EF 60-65%, Gr 1 DD, triv AI, mildly dil RV, PASP 78mmhg.  [name] TIA (transient ischemic attack)    a. 02/2011 - chronic coumadin in setting of PAF.  [name] Urinary, incontinence, stress female     Assessment: 49 YOF presented with SOB and confusion, found to have left lung collapse and pleural effusion now s/p bronch, thoracentesis and chest tube insertion.  Patient has a history of Afib (CHADSVASC = 6) on Coumadin PTA.  INR elevated on admission (uo to 4), now trended down to 2 and Pharmacy consulted to resume Coumadin.  No bleeding  reported.  Last Coumadin dose was on 01/08/20.  There is discrepancy regarding home Coumadin regimen and how patient was taking it.  Home regimen listed as 2mg  PO daily and patient might have been taking 2mg  PO BID.  Goal of Therapy:  INR 2-3 Monitor platelets by anticoagulation protocol: Yes   Plan:  Coumadin 5mg  PO today d/t rate of INR drop Daily PT / INR Verify home Coumadin regimen with patient when possible  Alanda Slim, PharmD, [facility] Clinical Pharmacist Please see AMION for all Pharmacists' Contact Phone Numbers 01/13/2020, 11:37 AM

## 2020-01-13 NOTE — Progress Notes (Addendum)
NAME:  [name], MRN:  [id number], DOB:  June 22, 1924, LOS: 4 ADMISSION DATE:  01/08/2020, CONSULTATION DATE:  01/10/2020 REFERRING MD: Graciella Freer, CHIEF COMPLAINT:  Resp distress    Past Medical History  Asthma/ COPD overlap, Afib on warfarin, HTN, HLD, CAD, osteomyelitis s/p R BKA (10/26/2019), anxiety, dysphagia, GERD,   Significant Hospital Events   7/5 Admit 7/7 PCCM consult/ resp arrest- intubated over night 7/8 bronch followed by thoracentesis. Iatrogenic PTX.  7/9  Consults:  PCCM  Procedures:  7/8 ETT 7/8 bronch  Significant Diagnostic Tests:  7/7 CT chest >>  Opacification of the left hemithorax secondary to complete consolidation and collapse of the left lung with associated large pleural effusion. Considerable dense material is noted within the bronchial tree on the left likely related to aspiration and/or mucous plugging. Large right-sided pleural effusion with right upper lobe infiltrate and right lower lobe consolidation. Mild inspissated material is noted in the lower lobe bronchi on right as well. Aneurysmal dilatation of the ascending aorta as described.  Scattered small likely reactive lymph nodes in the hila and mediastinum.  Micro Data:  7/5 SARS 2 >> neg 7/8 MRSA PCR >>  7/8 BAL >>RARE WBC PRESENT, PREDOMINANTLY PMN ABUNDANT GRAM POSITIVE COCCI IN PAIRS IN CHAINS FEW GRAM NEGATIVE RODS   Antimicrobials:  7/7 azithro >>stopped 7/7 ceftriaxone >>   Interim history/subjective:   Objective   Blood pressure (Abnormal) 112/56, pulse (Abnormal) 106, temperature 98.9 F (37.2 C), temperature source Axillary, resp. rate 18, height _0  (1.676 m), weight 56.9 kg, SpO2 100 %.    Vent Mode: CPAP;PSV FiO2 (%):  [40 %] 40 % Set Rate:  [18 bmp] 18 bmp Vt Set:  [470 mL] 470 mL PEEP:  [5 cmH20] 5 cmH20 Pressure Support:  [10 cmH20] 10 cmH20 Plateau Pressure:  [18 cmH20-25 cmH20] 25 cmH20   Intake/Output Summary (Last 24 hours) at 01/13/2020 1125 Last data  filed at 01/13/2020 0500 Gross per 24 hour  Intake 1274.76 ml  Output 160 ml  Net 1114.76 ml   Filed Weights   01/11/20 0500 01/12/20 0430 01/13/20 0500  Weight: 56 kg 56 kg 56.9 kg   Examination: Chronically ill-appearing 86 year old female currently on ventilatory support but not in acute distress HEENT normocephalic atraumatic poor dentition orally intubated no JVD Pulmonary some scattered rhonchi.  Currently on pressure support of 10.  Tidal volume in the 3 50-400 range no accessory use appreciated Cardiac: Regular irregular with atrial fibrillation on telemetry Abdomen: Soft nontender no organomegaly Extremities: Warm, dry.  She has a right below the knee amputation.  She has multiple areas of skin scattered ecchymosis, some significant bruising and fluid collection over the left and right wrists more so on the left. Neuro awake, interactive, moving extremities without deficits  Resolved Hospital Problem list    Assessment & Plan:   Critically ill due to Acute hypoxic respiratory failure requiring mechanical ventilation in setting of  CAP/ aspiration vs atelectasis complicated by likely mucous plugging with enlarging bilateral pleural effusions (left was boarderline exudate likely parapneumonic).  possible Asthma/ COPD exacerbation -Sputum culture still pending -pcxr on 7/9 left basilar atx. No ptx. Ct good position. Right effusion/airspace disease -Tolerating pressure support.  She is indicated she did not want this level of care.  Previous discussion with family suggesting that they would not desire long-term ventilation Plan Continue pressure support ventilation today Cont BDs PAD protocol RASS goal -1 Lasix x1 to maximize volume status VAP bundle Awaiting sputum culture Day #  4 ceftriaxone, will complete 5 days A.m. chest x-ray   post thoracentesis pneumothorax -radiographically resolved.  Plan Change chest tube to waterseal A.m. chest x-ray  Hx Dysphagia - on  dysphagia 3 with nectar thick in April.  Great granddaughter states she was placed back on normal diet in rehabl Plan NPO w/ SLP eval after extubation   Acute on chronic anemia Plan Trend cbc Transfuse for hgb < 7   Afib on home coumadin Plan Cont tele  Rate control Cont coumadin   Protein calorie malnutrition  Plan Cont tube feeds   Sacral decub- stage II Plan Wound care   Questionable developing dementia Plan Out-pt follow up   Best practice:  Diet:t tube feeds. Pain/Anxiety/Delirium protocol (if indicated): prn precedex gtt/ prn fentanyl for RASS goal 0/-1 VAP protocol (if indicated): yes DVT prophylaxis: SCD GI prophylaxis: PPI Glucose control: SSI Mobility: BR Code Status: Full, at this time family wants full aggressive therapies Family Communication: great granddaughter Colletta [place] [phone] updated on plan of care at bedside.  Her mother, Warren Lacy is patient's HPOA [phone] Disposition: ICU.  Labs   CBC: Recent Labs  Lab 01/08/20 1651 01/08/20 1715 01/09/20 0205 01/09/20 0205 01/09/20 0255 01/09/20 0535 01/10/20 0233 01/11/20 0010 01/11/20 0322 01/11/20 0337  WBC 6.2   < > 5.0  --  4.9  --  5.6 9.0  --  7.3  NEUTROABS 4.9  --   --   --   --   --   --   --   --   --   HGB 7.1*   < > 7.5*   < > 7.8*  --  8.7* 9.7* 9.5* 9.3*  HCT 22.9*   < > 24.4*   < > 25.5*  --  27.6* 32.1* 28.0* 29.9*  MCV 96.6   < > 88.1  --  89.8  --  89.0 91.7  --  89.0  PLT 163   < > 136*   < > 118* 134* 133* 168  --  150   < > = values in this interval not displayed.    Basic Metabolic Panel: Recent Labs  Lab 01/08/20 1651 01/08/20 1651 01/08/20 1715 01/09/20 0743 01/10/20 0233 01/11/20 0322 01/11/20 5784 01/11/20 1723 01/12/20 0358 01/12/20 1638 01/12/20 2035  NA 126*   < > 131* 130* 130* 133* 134*  --   --   --   --   K 4.1   < > 4.3 4.1 4.4 3.9 4.3  --   --   --   --   CL 93*  --   --  97* 96*  --  95*  --   --   --   --   CO2 25  --   --  26 26  --  27   --   --   --   --   GLUCOSE 115*  --   --  87 98  --  136*  --   --   --   --   BUN 14  --   --  11 21  --  24*  --   --   --   --   CREATININE 0.77  --   --  0.75 0.76  --  0.94  --   --   --   --   CALCIUM 8.4*  --   --  8.4* 8.5*  --  8.7*  --   --   --   --  MG  --   --   --   --   --   --   --  1.6* 1.6* 2.8*  --   PHOS  --   --   --   --   --   --   --  1.9* 1.6*  --  5.7*   < > = values in this interval not displayed.   GFR: Estimated Creatinine Clearance: 32.9 mL/min (by C-G formula based on SCr of 0.94 mg/dL). Recent Labs  Lab 01/09/20 0255 01/09/20 0523 01/09/20 0535 01/10/20 0233 01/11/20 0010 01/11/20 0337  PROCALCITON  --   --  <0.10  --   --   --   WBC 4.9  --   --  5.6 9.0 7.3  LATICACIDVEN  --  0.8  --   --   --   --     Liver Function Tests: Recent Labs  Lab 01/08/20 1651  AST 25  ALT 17  ALKPHOS 46  BILITOT 0.9  PROT 6.2*  ALBUMIN 3.2*   No results for input(s): LIPASE, AMYLASE in the last 168 hours. No results for input(s): AMMONIA in the last 168 hours.  ABG    Component Value Date/Time   PHART 7.554 (H) 01/11/2020 0322   PCO2ART 38.9 01/11/2020 0322   PO2ART 456 (H) 01/11/2020 0322   HCO3 34.4 (H) 01/11/2020 0322   TCO2 36 (H) 01/11/2020 0322   O2SAT 100.0 01/11/2020 0322     Coagulation Profile: Recent Labs  Lab 01/09/20 0535 01/10/20 0233 01/11/20 0923 01/12/20 0358 01/13/20 0339  INR 3.2* 4.0* 2.7* 2.0* 1.6*    Cardiac Enzymes: No results for input(s): CKTOTAL, CKMB, CKMBINDEX, TROPONINI in the last 168 hours.  HbA1C: Hgb A1c MFr Bld  Date/Time Value Ref Range Status  02/24/2011 05:28 AM 5.3 <5.7 % Final    Comment:    (NOTE)                                                                       According to the ADA Clinical Practice Recommendations for 2011, when HbA1c is used as a screening test:  >=6.5%   Diagnostic of Diabetes Mellitus           (if abnormal result is confirmed) 5.7-6.4%   Increased risk of  developing Diabetes Mellitus References:Diagnosis and Classification of Diabetes Mellitus,Diabetes JYNW,2956,21(HYQMV 1):S62-S69 and Standards of Medical Care in         Diabetes - 2011,Diabetes HQIO,9629,52 (Suppl 1):S11-S61.    CBG: Recent Labs  Lab 01/12/20 1509 01/12/20 2015 01/12/20 2314 01/13/20 0341 01/13/20 0747  GLUCAP 119* 111* 110* 104* 102*   CRITICAL CARE  My time 34 min   Performed by: Clementeen Graham

## 2020-01-14 ENCOUNTER — Inpatient Hospital Stay (HOSPITAL_COMMUNITY): Payer: Medicare HMO

## 2020-01-14 LAB — GLUCOSE, CAPILLARY
Glucose-Capillary: 106 mg/dL — ABNORMAL HIGH (ref 70–99)
Glucose-Capillary: 111 mg/dL — ABNORMAL HIGH (ref 70–99)
Glucose-Capillary: 112 mg/dL — ABNORMAL HIGH (ref 70–99)
Glucose-Capillary: 130 mg/dL — ABNORMAL HIGH (ref 70–99)
Glucose-Capillary: 136 mg/dL — ABNORMAL HIGH (ref 70–99)
Glucose-Capillary: 143 mg/dL — ABNORMAL HIGH (ref 70–99)

## 2020-01-14 LAB — COMPREHENSIVE METABOLIC PANEL
ALT: 19 U/L (ref 0–44)
AST: 17 U/L (ref 15–41)
Albumin: 2.5 g/dL — ABNORMAL LOW (ref 3.5–5.0)
Alkaline Phosphatase: 45 U/L (ref 38–126)
Anion gap: 11 (ref 5–15)
BUN: 23 mg/dL (ref 8–23)
CO2: 22 mmol/L (ref 22–32)
Calcium: 7.8 mg/dL — ABNORMAL LOW (ref 8.9–10.3)
Chloride: 101 mmol/L (ref 98–111)
Creatinine, Ser: 0.71 mg/dL (ref 0.44–1.00)
GFR calc Af Amer: >60 mL/min (ref >60)
GFR calc non Af Amer: >60 mL/min (ref >60)
Glucose, Bld: 114 mg/dL — ABNORMAL HIGH (ref 70–99)
Potassium: 3.9 mmol/L (ref 3.5–5.1)
Sodium: 134 mmol/L — ABNORMAL LOW (ref 135–145)
Total Bilirubin: 0.9 mg/dL (ref 0.3–1.2)
Total Protein: 5.3 g/dL — ABNORMAL LOW (ref 6.5–8.1)

## 2020-01-14 LAB — CBC
HCT: 26.1 % — ABNORMAL LOW (ref 36.0–46.0)
Hemoglobin: 8.6 g/dL — ABNORMAL LOW (ref 12.0–15.0)
MCH: 28.3 pg (ref 26.0–34.0)
MCHC: 33 g/dL (ref 30.0–36.0)
MCV: 85.9 fL (ref 80.0–100.0)
Platelets: 110 10*3/uL — ABNORMAL LOW (ref 150–400)
RBC: 3.04 MIL/uL — ABNORMAL LOW (ref 3.87–5.11)
RDW: 21.9 % — ABNORMAL HIGH (ref 11.5–15.5)
WBC: 6.5 10*3/uL (ref 4.0–10.5)
nRBC: 0 % (ref 0.0–0.2)

## 2020-01-14 LAB — BODY FLUID CULTURE: Culture: NO GROWTH

## 2020-01-14 LAB — CULTURE, BAL-QUANTITATIVE W GRAM STAIN: Culture: 40000 — AB

## 2020-01-14 LAB — PROTIME-INR
INR: 1.4 — ABNORMAL HIGH (ref 0.8–1.2)
Prothrombin Time: 16.3 seconds — ABNORMAL HIGH (ref 11.4–15.2)

## 2020-01-14 MED ORDER — ALBUMIN HUMAN 25 % IV SOLN
25.0000 g | Freq: Four times a day (QID) | INTRAVENOUS | Status: AC
  Administered 2020-01-14 – 2020-01-15 (×4): 25 g via INTRAVENOUS
  Filled 2020-01-14 (×4): qty 100

## 2020-01-14 MED ORDER — ORAL CARE MOUTH RINSE
15.0000 mL | Freq: Two times a day (BID) | OROMUCOSAL | Status: DC
  Administered 2020-01-15 – 2020-01-18 (×6): 15 mL via OROMUCOSAL

## 2020-01-14 MED ORDER — MAGNESIUM SULFATE 2 GM/50ML IV SOLN
2.0000 g | Freq: Once | INTRAVENOUS | Status: AC
  Administered 2020-01-14: 2 g via INTRAVENOUS
  Filled 2020-01-14: qty 50

## 2020-01-14 MED ORDER — RACEPINEPHRINE HCL 2.25 % IN NEBU
0.5000 mL | INHALATION_SOLUTION | Freq: Once | RESPIRATORY_TRACT | Status: AC
  Administered 2020-01-14: 0.5 mL via RESPIRATORY_TRACT

## 2020-01-14 MED ORDER — WARFARIN SODIUM 7.5 MG PO TABS
7.5000 mg | ORAL_TABLET | Freq: Once | ORAL | Status: DC
  Filled 2020-01-14: qty 1

## 2020-01-14 MED ORDER — SODIUM CHLORIDE 0.9 % IV SOLN: 250.0000 mL | INTRAVENOUS | Status: DC

## 2020-01-14 MED ORDER — POTASSIUM CHLORIDE 10 MEQ/100ML IV SOLN: 10.0000 meq | INTRAVENOUS | Status: DC

## 2020-01-14 MED ORDER — CHLORHEXIDINE GLUCONATE 0.12 % MT SOLN
15.0000 mL | Freq: Two times a day (BID) | OROMUCOSAL | Status: DC
  Administered 2020-01-15 – 2020-01-18 (×5): 15 mL via OROMUCOSAL
  Filled 2020-01-14 (×5): qty 15

## 2020-01-14 MED ORDER — AMIODARONE IV BOLUS ONLY 150 MG/100ML
150.0000 mg | Freq: Once | INTRAVENOUS | Status: AC
  Administered 2020-01-14: 150 mg via INTRAVENOUS

## 2020-01-14 MED ORDER — POTASSIUM CHLORIDE 20 MEQ/15ML (10%) PO SOLN
40.0000 meq | ORAL | Status: AC
  Filled 2020-01-14: qty 30

## 2020-01-14 MED ORDER — DEXAMETHASONE SODIUM PHOSPHATE 4 MG/ML IJ SOLN
4.0000 mg | Freq: Four times a day (QID) | INTRAMUSCULAR | Status: AC
  Administered 2020-01-14 – 2020-01-16 (×7): 4 mg via INTRAVENOUS
  Filled 2020-01-14 (×8): qty 1

## 2020-01-14 MED ORDER — PHENYLEPHRINE HCL-NACL 10-0.9 MG/250ML-% IV SOLN: 0.0000 ug/min | INTRAVENOUS | Status: DC

## 2020-01-14 MED ORDER — AMIODARONE HCL IN DEXTROSE 360-4.14 MG/200ML-% IV SOLN: 60.0000 mg/h | INTRAVENOUS | Status: DC

## 2020-01-14 MED ORDER — AMIODARONE HCL IN DEXTROSE 360-4.14 MG/200ML-% IV SOLN: 30.0000 mg/h | INTRAVENOUS | Status: DC

## 2020-01-14 MED ORDER — DEXMEDETOMIDINE HCL IN NACL 400 MCG/100ML IV SOLN
0.0000 ug/kg/h | INTRAVENOUS | Status: DC
  Administered 2020-01-14: 0.8 ug/kg/h via INTRAVENOUS
  Administered 2020-01-14: 0.6 ug/kg/h via INTRAVENOUS
  Administered 2020-01-15: 0.8 ug/kg/h via INTRAVENOUS
  Filled 2020-01-14 (×2): qty 100

## 2020-01-14 MED ORDER — POTASSIUM CHLORIDE 10 MEQ/100ML IV SOLN
10.0000 meq | INTRAVENOUS | Status: AC
  Administered 2020-01-14 (×4): 10 meq via INTRAVENOUS
  Filled 2020-01-14: qty 100

## 2020-01-14 MED ORDER — FUROSEMIDE 10 MG/ML IJ SOLN
40.0000 mg | Freq: Four times a day (QID) | INTRAMUSCULAR | Status: AC
  Administered 2020-01-14 (×2): 40 mg via INTRAVENOUS
  Filled 2020-01-14 (×2): qty 4

## 2020-01-14 MED ORDER — AMIODARONE LOAD VIA INFUSION
150.0000 mg | Freq: Once | INTRAVENOUS | Status: DC
  Filled 2020-01-14: qty 83.34

## 2020-01-14 MED ORDER — PHENYLEPHRINE HCL-NACL 10-0.9 MG/250ML-% IV SOLN: 25.0000 ug/min | INTRAVENOUS | Status: DC

## 2020-01-14 NOTE — Progress Notes (Signed)
Spoke with [name] and Colletta [place] via telephone, updated them on planned extubation this morning. Dicussed plan of care, including right sided thoracentesis after extubation. All questions answered at this time.

## 2020-01-14 NOTE — Progress Notes (Signed)
Thoracentesis performed at bedside with MD and RN present. Verbal consent given from patient, understanding procedure and risks. Patient assisted to side of bed and 600cc removed.  Patient developed afib RVR, amio bolus given. Patient placed on bipap by RT.

## 2020-01-14 NOTE — Progress Notes (Addendum)
ANTICOAGULATION CONSULT NOTE - Consult  Pharmacy Consult:  Coumadin Indication: atrial fibrillation  Allergies  Allergen Reactions  . Alphagan [Brimonidine] Other (See Comments)    Burning sensation  . Apraclonidine Other (See Comments)    Pain, brow pain, tender, not able to tolerate  . Biaxin [Clarithromycin] Other (See Comments)    Unknown, per pt   . Brovana [Arformoterol] Other (See Comments)    Makes my heart race  . Budesonide Other (See Comments)    Makes my heart race  . Cifenline Itching  . Clonidine Other (See Comments)    unknown  . Clonidine Derivatives Other (See Comments)    REACTION: unknown  . Dorzolamide Other (See Comments)    Redness  . Iodinated Diagnostic Agents Other (See Comments)    unknown  . Iohexol Other (See Comments)     Desc: NOTES FROM PRIOR CT STATES PRE MEDICATION PRIOR TO OMNIPAQUE ENHANCED CT   . Nsaids Other (See Comments)    Unknown  . Penicillins Itching, Swelling and Other (See Comments)    Swelling and edema of ears and rash Swelling to ears  Has patient had a PCN reaction causing immediate rash, facial/tongue/throat swelling, SOB or lightheadedness with hypotension: {no Has patient had a PCN reaction causing severe rash involving mucus membranes or skin necrosis: {no Has patient had a PCN reaction that required hospitalization no Has patient had a PCN reaction occurring within the last 10 years: no If all of the above answers are "NO", then may proceed with Cephalosporin use.  . Sulfamethoxazole Other (See Comments)    Unknown  . Sulfonamide Derivatives Itching  . Tetracycline Itching and Other (See Comments)    unknown  . Timolol Other (See Comments)    Eye red and irritated   . Vancomycin Other (See Comments)    Turn red  . Zetia [Ezetimibe] Other (See Comments)    Unknown  . Cardizem [Diltiazem Hcl] Rash    Patient Measurements: Height: 5\' 6"  (167.6 cm) Weight: 57.3 kg (126 lb 5.2 oz) IBW/kg (Calculated) :  59.3  Vital Signs: Temp: 99 F (37.2 C) (07/11 0800) Temp Source: Axillary (07/11 0800) BP: 120/67 (07/11 0900) Pulse Rate: 109 (07/11 0913)  Labs: Recent Labs    01/12/20 0358 01/13/20 0339 01/14/20 0416  HGB  --   --  8.6*  HCT  --   --  26.1*  PLT  --   --  110*  LABPROT 21.9* 18.2* 16.3*  INR 2.0* 1.6* 1.4*  CREATININE  --   --  0.71    Estimated Creatinine Clearance: 38.9 mL/min (by C-G formula based on SCr of 0.71 mg/dL).   Medical History: Past Medical History:  Diagnosis Date  . Anxiety   . Arthritis   . Asthma   . CAD (coronary artery disease) 2007   a. 1997 s/p PCI of diagonal.  . Constipation   . COPD/Asthma   . Diverticulosis   . Esophageal reflux   . Family history of adverse reaction to anesthesia    daughter had trouble breathing after surgery, had to be reintubated  . Glaucoma   . HTN (hypertension), benign   . Hyperlipidemia   . Malignant melanoma ([place])    a. 09/2012 s/p resection.  . Neuropathy    lower legs  . PAF (paroxysmal atrial fibrillation) (HCC)    a. intolerant to beta blockers due to asthma and intolerant to CCB due to rash;  b. CHA2DS2VASc = 6-->chronic coumadin;  c. 03/2014 Echo: EF  60-65%, Gr 1 DD, triv AI, mildly dil RV, PASP 4mmhg.  [name] TIA (transient ischemic attack)    a. 02/2011 - chronic coumadin in setting of PAF.  [name] Urinary, incontinence, stress female     Assessment: 73 YOF presented with SOB and confusion, found to have left lung collapse and pleural effusion now s/p bronch, thoracentesis and chest tube insertion.  Patient has a history of Afib (CHADSVASC = 6) on Coumadin PTA.  INR elevated on admission (uo to 4), now trended down to 2 and Pharmacy consulted to resume Coumadin.  No bleeding reported.  Last Coumadin dose was on 01/08/20.  There is discrepancy regarding home Coumadin regimen and how patient was taking it.  Home regimen listed as 2mg  PO daily and patient might have been taking 2mg  PO BID.  Goal of Therapy:   INR 2-3 Monitor platelets by anticoagulation protocol: Yes   Plan:  Coumadin 7.5mg  PO today d/t rate of INR drop; patient also started on amiodarone which can enhance warfarin anticoagulant effects Daily PT / INR Verify home Coumadin regimen with patient when possible  Alanda Slim, PharmD, [facility] Clinical Pharmacist Please see AMION for all Pharmacists' Contact Phone Numbers 01/14/2020, 11:13 AM

## 2020-01-14 NOTE — Procedures (Signed)
Thoracentesis  Procedure Note  CHRISETTE MAN  [id number]  January 12, 1924  Date:01/14/20  Time:9:42 AM   Provider Performing:Vaiden Adames C Tamala Julian   Procedure: Thoracentesis with imaging guidance (70263)  Indication(s) Pleural Effusion  Consent Risks of the procedure as well as the alternatives and risks of each were explained to the patient and/or caregiver.  Consent for the procedure was obtained and is signed in the bedside chart  Anesthesia Topical only with 1% lidocaine    Time Out Verified patient identification, verified procedure, site/side was marked, verified correct patient position, special equipment/implants available, medications/allergies/relevant history reviewed, required imaging and test results available.   Sterile Technique Maximal sterile technique including full sterile barrier drape, hand hygiene, sterile gown, sterile gloves, mask, hair covering, sterile ultrasound probe cover (if used).  Procedure Description Ultrasound was used to identify appropriate pleural anatomy for placement and overlying skin marked.  Area of drainage cleaned and draped in sterile fashion. Lidocaine was used to anesthetize the skin and subcutaneous tissue.  600 cc's of straw appearing fluid was drained from the right pleural space. Catheter then removed and bandaid applied to site.   Complications/Tolerance Went into Afib/RVR and stridor with repositioning, placed on NIV while thora performed, now getting magnesium and amiodarone boluses Chest X-ray is ordered to confirm no post-procedural complication.   EBL Minimal   Specimen(s) None

## 2020-01-14 NOTE — Progress Notes (Signed)
NAME:  [name], MRN:  [id number], DOB:  Aug 11, 1923, LOS: 5 ADMISSION DATE:  01/08/2020, CONSULTATION DATE:  01/10/2020 REFERRING MD: Graciella Freer, CHIEF COMPLAINT:  Resp distress    Past Medical History  Asthma/ COPD overlap, Afib on warfarin, HTN, HLD, CAD, osteomyelitis s/p R BKA (10/26/2019), anxiety, dysphagia, GERD,   Significant Hospital Events   7/5 Admit 7/7 PCCM consult/ resp arrest- intubated over night 7/8 bronch followed by thoracentesis. Iatrogenic PTX.  7/9  Consults:  PCCM  Procedures:  7/8 ETT 7/8 bronch  Significant Diagnostic Tests:  7/7 CT chest >>  Opacification of the left hemithorax secondary to complete consolidation and collapse of the left lung with associated large pleural effusion. Considerable dense material is noted within the bronchial tree on the left likely related to aspiration and/or mucous plugging. Large right-sided pleural effusion with right upper lobe infiltrate and right lower lobe consolidation. Mild inspissated material is noted in the lower lobe bronchi on right as well. Aneurysmal dilatation of the ascending aorta as described.  Scattered small likely reactive lymph nodes in the hila and mediastinum.  Micro Data:  7/5 SARS 2 >> neg 7/8 MRSA PCR >>  7/8 BAL >>RARE WBC PRESENT, PREDOMINANTLY PMN ABUNDANT GRAM POSITIVE COCCI IN PAIRS IN CHAINS FEW GRAM NEGATIVE RODS   Antimicrobials:  7/7 azithro >>stopped 7/7 ceftriaxone >>   Interim history/subjective:  No events, wide awake.  Objective   Blood pressure (!) 99/53, pulse (!) 118, temperature 98 F (36.7 C), temperature source Oral, resp. rate 18, height _0  (1.676 m), weight 57.3 kg, SpO2 100 %.    Vent Mode: PRVC FiO2 (%):  [40 %] 40 % Set Rate:  [18 bmp] 18 bmp Vt Set:  [470 mL] 470 mL PEEP:  [5 cmH20] 5 cmH20 Pressure Support:  [10 cmH20] 10 cmH20 Plateau Pressure:  [18 cmH20] 18 cmH20   Intake/Output Summary (Last 24 hours) at 01/14/2020 0839 Last data filed at  01/14/2020 0500 Gross per 24 hour  Intake 204.14 ml  Output 2500 ml  Net -2295.86 ml   Filed Weights   01/12/20 0430 01/13/20 0500 01/14/20 0500  Weight: 56 kg 56.9 kg 57.3 kg   Examination: GEN: elderly woman on vent HEENT: ETT in place, minimal secretions CV: RRR, ext warm PULM: Diminished at R base, L lung clear, L chest tue with serous output GI: Soft, +BS EXT: Muscle wasting and trace edema, multiple scabs NEURO: Moves all 4 ext to command, weak PSYCH: RASS 0 SKIN: Multiple areas of skin breakdown  CXR continued R effusion  Resolved Hospital Problem list    Assessment & Plan:   Critically ill due to Acute hypoxic respiratory failure some combination aspiration, fluid overload, improving Post thoracentesis pneumothorax- resolved on CXR - Wean to extubate - Therapeutic R thora today after extubation - Work toward L chest tube removal starting tomorrow - Give another couple doses lasix - Replete K  Hx Dysphagia- on dysphagia 3 with nectar thick in April.  Great granddaughter states she was placed back on normal diet in rehabl - Should be back on dysphagia diet when extubated  Afib on home coumadin -Cont tele  -Rate control -Cont coumadin, still subtherapeutic, appreciate pharmacy help  Protein calorie malnutrition  -Cont tube feeds  Sacral decub- stage II Plan -Wound care  Best practice:  Diet: on hold for potential extubation Pain/Anxiety/Delirium protocol (if indicated): wean off VAP protocol (if indicated): yes DVT prophylaxis: SCD GI prophylaxis: PPI Glucose control: SSI Mobility: BR Code Status:  Full, at this time family wants full aggressive therapies Family Communication: updated by RN Disposition: ICU.   The patient is critically ill with multiple organ systems failure and requires high complexity decision making for assessment and support, frequent evaluation and titration of therapies, application of advanced monitoring technologies and  extensive interpretation of multiple databases. Critical Care Time devoted to patient care services described in this note independent of APP/resident time (if applicable)  is 31 minutes.   Erskine Emery MD [facility] 01/14/2020 8:46 AM Personal pager: [phone] If unanswered, please page CCM On-call: [phone]

## 2020-01-15 ENCOUNTER — Inpatient Hospital Stay (HOSPITAL_COMMUNITY): Payer: Medicare HMO

## 2020-01-15 LAB — GLUCOSE, CAPILLARY
Glucose-Capillary: 103 mg/dL — ABNORMAL HIGH (ref 70–99)
Glucose-Capillary: 117 mg/dL — ABNORMAL HIGH (ref 70–99)
Glucose-Capillary: 131 mg/dL — ABNORMAL HIGH (ref 70–99)
Glucose-Capillary: 136 mg/dL — ABNORMAL HIGH (ref 70–99)
Glucose-Capillary: 95 mg/dL (ref 70–99)

## 2020-01-15 LAB — PROTIME-INR
INR: 1.3 — ABNORMAL HIGH (ref 0.8–1.2)
Prothrombin Time: 16.1 seconds — ABNORMAL HIGH (ref 11.4–15.2)

## 2020-01-15 MED ORDER — METOPROLOL TARTRATE 5 MG/5ML IV SOLN
5.0000 mg | Freq: Two times a day (BID) | INTRAVENOUS | Status: DC
  Administered 2020-01-15: 5 mg via INTRAVENOUS
  Filled 2020-01-15: qty 5

## 2020-01-15 MED ORDER — ENOXAPARIN SODIUM 60 MG/0.6ML ~~LOC~~ SOLN
1.0000 mg/kg | Freq: Two times a day (BID) | SUBCUTANEOUS | Status: DC
  Administered 2020-01-15 – 2020-01-17 (×4): 50 mg via SUBCUTANEOUS
  Filled 2020-01-15 (×4): qty 0.6

## 2020-01-15 MED ORDER — WARFARIN SODIUM 7.5 MG PO TABS
7.5000 mg | ORAL_TABLET | Freq: Once | ORAL | Status: DC
  Filled 2020-01-15: qty 1

## 2020-01-15 MED ORDER — MONTELUKAST SODIUM 10 MG PO TABS
10.0000 mg | ORAL_TABLET | Freq: Every day | ORAL | Status: DC
  Administered 2020-01-16 – 2020-01-17 (×2): 10 mg via ORAL
  Filled 2020-01-15 (×2): qty 1

## 2020-01-15 MED ORDER — POLYETHYLENE GLYCOL 3350 17 G PO PACK
17.0000 g | PACK | Freq: Every day | ORAL | Status: DC
  Administered 2020-01-18: 17 g via ORAL
  Filled 2020-01-15: qty 1

## 2020-01-15 MED ORDER — CITALOPRAM HYDROBROMIDE 20 MG PO TABS
10.0000 mg | ORAL_TABLET | Freq: Every day | ORAL | Status: DC
  Administered 2020-01-16 – 2020-01-18 (×3): 10 mg via ORAL
  Filled 2020-01-15 (×3): qty 1

## 2020-01-15 MED ORDER — METOPROLOL TARTRATE 5 MG/5ML IV SOLN
2.5000 mg | Freq: Two times a day (BID) | INTRAVENOUS | Status: DC
  Administered 2020-01-15: 2.5 mg via INTRAVENOUS
  Filled 2020-01-15: qty 5

## 2020-01-15 MED ORDER — DOCUSATE SODIUM 100 MG PO CAPS
100.0000 mg | ORAL_CAPSULE | Freq: Two times a day (BID) | ORAL | Status: DC
  Filled 2020-01-15: qty 1

## 2020-01-15 NOTE — Progress Notes (Addendum)
NAME:  [name], MRN:  [id number], DOB:  1923-12-17, LOS: 6 ADMISSION DATE:  01/08/2020, CONSULTATION DATE:  01/10/2020 REFERRING MD: Graciella Freer, CHIEF COMPLAINT:  Resp distress  Past Medical History  Asthma/ COPD overlap, Afib on warfarin, HTN, HLD, CAD, osteomyelitis s/p R BKA (10/26/2019), anxiety, dysphagia, GERD,   Significant Hospital Events   7/5 Admit 7/7 PCCM consult/ resp arrest- intubated over night 7/8 bronch followed by thoracentesis. Iatrogenic PTX.  7/11 Right thoracentesis, extubated  Consults:  PCCM  Procedures:  7/8  > 7/11 ETT 7/8 bronch   Significant Diagnostic Tests:  7/7 CT chest >>  Opacification of the left hemithorax secondary to complete consolidation and collapse of the left lung with associated large pleural effusion. Considerable dense material is noted within the bronchial tree on the left likely related to aspiration and/or mucous plugging. Large right-sided pleural effusion with right upper lobe infiltrate and right lower lobe consolidation. Mild inspissated material is noted in the lower lobe bronchi on right as well. Aneurysmal dilatation of the ascending aorta as described.   Scattered small likely reactive lymph nodes in the hila and mediastinum.  Micro Data:  7/5 SARS 2 >> neg 7/8 MRSA PCR >>  7/8 BAL >> MSSA  Antimicrobials:  7/7 azithro >>stopped 7/7 ceftriaxone >> to complete on 7/13 for 7 day course   Interim history/subjective:  No events, wide awake.  Objective   Blood pressure (!) 111/58, pulse 92, temperature (!) 96.8 F (36 C), temperature source Axillary, resp. rate (!) 22, height _0  (1.676 m), weight 49.9 kg, SpO2 100 %.        Intake/Output Summary (Last 24 hours) at 01/15/2020 1140 Last data filed at 01/15/2020 0700 Gross per 24 hour  Intake 1042.75 ml  Output 3140 ml  Net -2097.25 ml   Filed Weights   01/13/20 0500 01/14/20 0500 01/15/20 0500  Weight: 56.9 kg 57.3 kg 49.9 kg   Examination: GEN: elderly  female sitting upright in bed with [place] on  HEENT:MMM CV: irregularly irregular, bilateral radial pulses palpable  PULM: appears to be aerating well, no wheezing or crackles appreciated  EXT: multiple healing areas on upper extremities, right BKA, no edema in left LE  NEURO: alert, oriented to self and location SKIN: multiple healing scabs on distal areas of bilateral upper extremities, indurated nodule on LUE   Resolved Hospital Problem list    Assessment & Plan:   Acute hypoxic respiratory failure, CAP some combination aspiration, fluid overload, improving s/p thoracentesis for pneumothorax, resolved on CXR. - weaning ventilator  - Day 6/7 on CTX for CAP   Hx Dysphagia - plan for SLP and nutrition evaluation   Afib on home coumadin - Cont tele  - Rate control - depending on SLP recommendations will either continue coumadin or lovenox 44m subcutaneous - spoke with patient's granddaughter who states patient takes 291mcoumadin twice daily   - plan to restart metoprolol 2.5 BID IV  Protein calorie malnutrition  - NPO, nutrition consult and SLP evaluation for hx of dysphagia   Sacral decub- stage II - Wound care  Best practice:  Diet: NPO for now, pending recommendations from SLP  Pain/Anxiety/Delirium protocol (if indicated): wean off precedex  VAP protocol (if indicated): yes DVT prophylaxis: on coumadin  GI prophylaxis: PPI Glucose control: SSI Mobility: BR Code Status: Full, at this time family wants full aggressive therapies Family Communication: updated patient's granddaughter via telephone 7/12.  Disposition: ICU until weaned from precedex   MaEulis FosterMD  [place]  PGY-2  01/15/20

## 2020-01-15 NOTE — Evaluation (Signed)
Clinical/Bedside Swallow Evaluation Patient Details  Name: [name] MRN: [id number] Date of Birth: 09-Aug-1923  Today's Date: 01/15/2020 Time: SLP Start Time (ACUTE ONLY): 1445 SLP Stop Time (ACUTE ONLY): 1508 SLP Time Calculation (min) (ACUTE ONLY): 23 min  Past Medical History:  Past Medical History:  Diagnosis Date  . Anxiety   . Arthritis   . Asthma   . CAD (coronary artery disease) 2007   a. 1997 s/p PCI of diagonal.  . Constipation   . COPD/Asthma   . Diverticulosis   . Esophageal reflux   . Family history of adverse reaction to anesthesia    daughter had trouble breathing after surgery, had to be reintubated  . Glaucoma   . HTN (hypertension), benign   . Hyperlipidemia   . Malignant melanoma ([place])    a. 09/2012 s/p resection.  . Neuropathy    lower legs  . PAF (paroxysmal atrial fibrillation) (HCC)    a. intolerant to beta blockers due to asthma and intolerant to CCB due to rash;  b. CHA2DS2VASc = 6-->chronic coumadin;  c. 03/2014 Echo: EF 60-65%, Gr 1 DD, triv AI, mildly dil RV, PASP 19mmhg.  [name] TIA (transient ischemic attack)    a. 02/2011 - chronic coumadin in setting of PAF.  [name] Urinary, incontinence, stress female    Past Surgical History:  Past Surgical History:  Procedure Laterality Date  . ABDOMINAL HYSTERECTOMY    . AMPUTATION Right 10/26/2019   Procedure: AMPUTATION BELOW KNEE;  Surgeon: Erle Crocker, MD;  Location: [facility];  Service: Orthopedics;  Laterality: Right;  . APPENDECTOMY    . BACK SURGERY     lumbar laminectomy  . CHOLECYSTECTOMY    . COLONOSCOPY    . CORONARY ANGIOPLASTY WITH STENT PLACEMENT    . EYE SURGERY    . NASAL SINUS SURGERY    . VESICOVAGINAL FISTULA CLOSURE W/ TAH    . WEIL OSTEOTOMY Left 06/12/2016   Procedure: Left [place] and [place]iken Left [name], Weil Osteotomy 2nd and 3rd Metatarsal;  Surgeon: Newt Minion, MD;  Location: [facility];  Service: Orthopedics;  Laterality: Left;   HPI:  86 yo admitted on 7/5 for  symptomatic anemia. Has had increasing O2 requirements and per chart on 7/7 she had worsening hypoxic respiratory failure with imaging showing complete opacification of the left hemithorax. and had respiratory arrest, intubated. Extubated 7/11. Thoracentesis 7/8 and chest tube. MBS 10/2019 with aspiration thin nectar (decreased with small sips nectar) and nectar Dys 3 recommended.    Assessment / Plan / Recommendation Clinical Impression  Suspect a respiratory based dysphagia s/p 4 day intubation and history of dysphagia. Intermittently she inhales post swallow due to suspected dyspnea with increased risk of inhaling po's. Even puree was questionable and given history, frality, pt would benefit from repeat MBS tomorrow. Recommend oral care and continue NPO.    SLP Visit Diagnosis: Dysphagia, unspecified (R13.10)    Aspiration Risk  Mild aspiration risk;Moderate aspiration risk    Diet Recommendation NPO        Other  Recommendations Oral Care Recommendations: Oral care QID   Follow up Recommendations  (TBD)      Frequency and Duration            Prognosis        Swallow Study   General HPI: 86 yo admitted on 7/5 for symptomatic anemia. Has had increasing O2 requirements and per chart on 7/7 she had worsening hypoxic respiratory failure with imaging showing complete  opacification of the left hemithorax. and had respiratory arrest, intubated. Extubated 7/11. Thoracentesis 7/8 and chest tube. MBS 10/2019 with aspiration thin nectar (decreased with small sips nectar) and nectar Dys 3 recommended.  Type of Study: Bedside Swallow Evaluation Previous Swallow Assessment:  (see HPI) Diet Prior to this Study: NPO Temperature Spikes Noted: Yes Respiratory Status: Nasal cannula History of Recent Intubation: Yes Length of Intubations (days):  (4) Date extubated: 01/14/20 Behavior/Cognition: Alert;Cooperative;Pleasant mood Oral Cavity Assessment: Other (comment) (lingual candidas) Oral Care  Completed by SLP: Yes Oral Cavity - Dentition: Other (Comment) (partials upper and lower) Vision: Functional for self-feeding Self-Feeding Abilities: Able to feed self Patient Positioning: Upright in bed Baseline Vocal Quality: Low vocal intensity;Hoarse Volitional Cough: Strong Volitional Swallow: Able to elicit    Oral/Motor/Sensory Function Overall Oral Motor/Sensory Function: Within functional limits   Ice Chips Ice chips: Not tested   Thin Liquid Thin Liquid: Not tested    Nectar Thick Nectar Thick Liquid: Impaired Other Comments:  (inhalation post swallow)   Honey Thick Honey Thick Liquid: Not tested   Puree Puree: Impaired Presentation: Self Fed;Spoon Pharyngeal Phase Impairments: Cough - Delayed   Solid     Solid: Not tested      Houston Siren 01/15/2020,3:43 PM   Janice DennisEd Risk analyst [phone] Office [phone]

## 2020-01-15 NOTE — Progress Notes (Signed)
ANTICOAGULATION CONSULT NOTE - Consult  Pharmacy Consult:  Coumadin Indication: atrial fibrillation  Allergies  Allergen Reactions  . Alphagan [Brimonidine] Other (See Comments)    Burning sensation  . Apraclonidine Other (See Comments)    Pain, brow pain, tender, not able to tolerate  . Biaxin [Clarithromycin] Other (See Comments)    Unknown, per pt   . Brovana [Arformoterol] Other (See Comments)    Makes my heart race  . Budesonide Other (See Comments)    Makes my heart race  . Cifenline Itching  . Clonidine Other (See Comments)    unknown  . Clonidine Derivatives Other (See Comments)    REACTION: unknown  . Dorzolamide Other (See Comments)    Redness  . Iodinated Diagnostic Agents Other (See Comments)    unknown  . Iohexol Other (See Comments)     Desc: NOTES FROM PRIOR CT STATES PRE MEDICATION PRIOR TO OMNIPAQUE ENHANCED CT   . Nsaids Other (See Comments)    Unknown  . Penicillins Itching, Swelling and Other (See Comments)    Swelling and edema of ears and rash Swelling to ears  Has patient had a PCN reaction causing immediate rash, facial/tongue/throat swelling, SOB or lightheadedness with hypotension: {no Has patient had a PCN reaction causing severe rash involving mucus membranes or skin necrosis: {no Has patient had a PCN reaction that required hospitalization no Has patient had a PCN reaction occurring within the last 10 years: no If all of the above answers are "NO", then may proceed with Cephalosporin use.  . Sulfamethoxazole Other (See Comments)    Unknown  . Sulfonamide Derivatives Itching  . Tetracycline Itching and Other (See Comments)    unknown  . Timolol Other (See Comments)    Eye red and irritated   . Vancomycin Other (See Comments)    Turn red  . Zetia [Ezetimibe] Other (See Comments)    Unknown  . Cardizem [Diltiazem Hcl] Rash    Patient Measurements: Height: 5\' 6"  (167.6 cm) Weight: 49.9 kg (110 lb 0.2 oz) IBW/kg (Calculated) :  59.3  Vital Signs: Temp: 96.8 F (36 C) (07/12 0400) Temp Source: Axillary (07/12 0400) BP: 108/70 (07/12 1300) Pulse Rate: 91 (07/12 1300)  Labs: Recent Labs    01/13/20 0339 01/14/20 0416 01/15/20 0216  HGB  --  8.6*  --   HCT  --  26.1*  --   PLT  --  110*  --   LABPROT 18.2* 16.3* 16.1*  INR 1.6* 1.4* 1.3*  CREATININE  --  0.71  --     Estimated Creatinine Clearance: 33.9 mL/min (by C-G formula based on SCr of 0.71 mg/dL).  Assessment: [name] presented with SOB and confusion, found to have left lung collapse and pleural effusion now s/p bronch, thoracentesis and chest tube insertion.  Patient has a history of Afib (CHADSVASC = 6) on Coumadin PTA.   There is discrepancy regarding home Coumadin regimen and how patient was taking it.  Home regimen listed as 2mg  PO daily and patient might have been taking 2mg  PO BID.  Warfarin not given last night d/t patient NPO on bipap  Goal of Therapy:  INR 2-3 Monitor platelets by anticoagulation protocol: Yes   Plan:  warfarin 7.5mg  PO today - unless fails swallow Add enox 1 mg/kg q12h F/u INR and dc enox when INR > 2  Barth Kirks, PharmD, BCPS, BCCCP Clinical Pharmacist [phone]  Please check AMION for all [place] numbers  01/15/2020 1:24 PM

## 2020-01-15 NOTE — Plan of Care (Signed)
  Problem: Education: Goal: Knowledge of General Education information will improve Description: Including pain rating scale, medication(s)/side effects and non-pharmacologic comfort measures Outcome: Progressing   Problem: Clinical Measurements: Goal: Ability to maintain clinical measurements within normal limits will improve Outcome: Progressing   Problem: Clinical Measurements: Goal: Diagnostic test results will improve Outcome: Progressing   Problem: Clinical Measurements: Goal: Respiratory complications will improve Outcome: Progressing   Problem: Elimination: Goal: Will not experience complications related to urinary retention Outcome: Progressing   Problem: Pain Managment: Goal: General experience of comfort will improve Outcome: Progressing   Problem: Skin Integrity: Goal: Risk for impaired skin integrity will decrease Outcome: Progressing

## 2020-01-15 NOTE — Progress Notes (Signed)
Report given to [place], Therapist, sports. Patient transferred to 2W21 without incident. Placed on telemetry monitor upon arrival. Call placed to patient's great granddaughter to notify her of move.   Milford Cage, RN

## 2020-01-16 ENCOUNTER — Telehealth: Payer: Self-pay | Admitting: Internal Medicine

## 2020-01-16 ENCOUNTER — Inpatient Hospital Stay (HOSPITAL_COMMUNITY): Payer: Medicare HMO

## 2020-01-16 DIAGNOSIS — J690 Pneumonitis due to inhalation of food and vomit: Principal | ICD-10-CM

## 2020-01-16 DIAGNOSIS — R54 Age-related physical debility: Secondary | ICD-10-CM

## 2020-01-16 DIAGNOSIS — Z66 Do not resuscitate: Secondary | ICD-10-CM

## 2020-01-16 DIAGNOSIS — Z7189 Other specified counseling: Secondary | ICD-10-CM

## 2020-01-16 DIAGNOSIS — Z515 Encounter for palliative care: Secondary | ICD-10-CM

## 2020-01-16 LAB — GLUCOSE, CAPILLARY
Glucose-Capillary: 107 mg/dL — ABNORMAL HIGH (ref 70–99)
Glucose-Capillary: 103 mg/dL — ABNORMAL HIGH (ref 70–99)
Glucose-Capillary: 95 mg/dL (ref 70–99)
Glucose-Capillary: 108 mg/dL — ABNORMAL HIGH (ref 70–99)
Glucose-Capillary: 92 mg/dL (ref 70–99)
Glucose-Capillary: 96 mg/dL (ref 70–99)

## 2020-01-16 LAB — CBC WITH DIFFERENTIAL/PLATELET
Abs Immature Granulocytes: 0.07 10*3/uL (ref 0.00–0.07)
Basophils Absolute: 0 10*3/uL (ref 0.0–0.1)
Basophils Relative: 0 %
Eosinophils Absolute: 0 10*3/uL (ref 0.0–0.5)
Eosinophils Relative: 0 %
HCT: 28.5 % — ABNORMAL LOW (ref 36.0–46.0)
Hemoglobin: 8.7 g/dL — ABNORMAL LOW (ref 12.0–15.0)
Immature Granulocytes: 1 %
Lymphocytes Relative: 6 %
Lymphs Abs: 0.8 10*3/uL (ref 0.7–4.0)
MCH: 26.9 pg (ref 26.0–34.0)
MCHC: 30.5 g/dL (ref 30.0–36.0)
MCV: 88.2 fL (ref 80.0–100.0)
Monocytes Absolute: 0.5 10*3/uL (ref 0.1–1.0)
Monocytes Relative: 4 %
Neutro Abs: 10.8 10*3/uL — ABNORMAL HIGH (ref 1.7–7.7)
Neutrophils Relative %: 89 %
Platelets: UNDETERMINED 10*3/uL (ref 150–400)
RBC: 3.23 MIL/uL — ABNORMAL LOW (ref 3.87–5.11)
RDW: 22.2 % — ABNORMAL HIGH (ref 11.5–15.5)
WBC: 12.1 10*3/uL — ABNORMAL HIGH (ref 4.0–10.5)
nRBC: 0 % (ref 0.0–0.2)

## 2020-01-16 LAB — PROTIME-INR
INR: 1.8 — ABNORMAL HIGH (ref 0.8–1.2)
Prothrombin Time: 19.8 seconds — ABNORMAL HIGH (ref 11.4–15.2)

## 2020-01-16 LAB — BASIC METABOLIC PANEL
Anion gap: 14 (ref 5–15)
BUN: 35 mg/dL — ABNORMAL HIGH (ref 8–23)
CO2: 23 mmol/L (ref 22–32)
Calcium: 9.3 mg/dL (ref 8.9–10.3)
Chloride: 100 mmol/L (ref 98–111)
Creatinine, Ser: 0.81 mg/dL (ref 0.44–1.00)
GFR calc Af Amer: >60 mL/min (ref >60)
GFR calc non Af Amer: >60 mL/min (ref >60)
Glucose, Bld: 107 mg/dL — ABNORMAL HIGH (ref 70–99)
Potassium: 3.9 mmol/L (ref 3.5–5.1)
Sodium: 137 mmol/L (ref 135–145)

## 2020-01-16 MED ORDER — ADULT MULTIVITAMIN W/MINERALS CH
1.0000 | ORAL_TABLET | Freq: Every day | ORAL | Status: DC
  Administered 2020-01-16 – 2020-01-18 (×3): 1 via ORAL
  Filled 2020-01-16 (×3): qty 1

## 2020-01-16 MED ORDER — ENSURE ENLIVE PO LIQD
237.0000 mL | Freq: Two times a day (BID) | ORAL | Status: DC
  Administered 2020-01-16: 237 mL via ORAL

## 2020-01-16 MED ORDER — MIDAZOLAM HCL 2 MG/2ML IJ SOLN: 0.5000 mg | Freq: Once | INTRAMUSCULAR | Status: DC

## 2020-01-16 MED ORDER — IPRATROPIUM-ALBUTEROL 0.5-2.5 (3) MG/3ML IN SOLN: 3.0000 mL | Freq: Four times a day (QID) | RESPIRATORY_TRACT | Status: DC | PRN

## 2020-01-16 MED ORDER — METOPROLOL TARTRATE 5 MG/5ML IV SOLN: 5.0000 mg | Freq: Three times a day (TID) | INTRAVENOUS | Status: DC

## 2020-01-16 MED ORDER — WARFARIN SODIUM 5 MG PO TABS
5.0000 mg | ORAL_TABLET | Freq: Once | ORAL | Status: AC
  Administered 2020-01-16: 5 mg via ORAL
  Filled 2020-01-16: qty 1

## 2020-01-16 MED ORDER — METOPROLOL TARTRATE 5 MG/5ML IV SOLN
5.0000 mg | Freq: Three times a day (TID) | INTRAVENOUS | Status: DC
  Administered 2020-01-16 – 2020-01-17 (×3): 5 mg via INTRAVENOUS
  Filled 2020-01-16 (×3): qty 5

## 2020-01-16 MED ORDER — ENSURE ENLIVE PO LIQD
237.0000 mL | ORAL | Status: DC
  Administered 2020-01-17 – 2020-01-18 (×2): 237 mL via ORAL

## 2020-01-16 NOTE — Consult Note (Addendum)
Consultation Note Date: 01/16/2020   Patient Name: [name]  DOB: 07-11-1923  MRN: [id number]  Age / Sex: 86 y.o., female  PCP: Harrison Mons, [place] Referring Physician: Darliss Cheney, MD  Reason for Consultation: Establishing goals of care  HPI/Patient Profile: 86 y.o. female  with past medical history of asthma, COPD, HTN, HLD, coronary artery disease, osteomyelitis s/p R BKA April of this year, anxiety, dysphagia, GERD, a-fib, TIA, abdominal aortic aneurysm, hyponatremia  admitted on 01/08/2020 with complaints of shortness of breath. Workup revealed likely aspiration pneumonia with collapse of L lung. She required intubation on 7/7, was extubated on 7/11. She has had R and L thoracentesis and currently has a chest tube in place. Has completed antibiotic treatment for pneumonia. Underwent modified barium swallow showing significant dysphagia which was present in April 2021. Speech therapist discussed adjustments in diet with patient- reports she chose comfort feeding and thin liquids. As patient's dysphagia is likely to lead to recurrent pneumonia- Palliative medicine consulted for goals of care.   Clinical Assessment and Goals of Care:   I have reviewed medical records including EPIC notes, labs and imaging, received report from patient's nurse, examined the patient and spoke via phone   to patient's great granddaughter- Colletta Maryland- discuss diagnosis prognosis, [place], EOL wishes, disposition and options.  I introduced Palliative Medicine as specialized medical care for people living with serious illness. It focuses on providing relief from the symptoms and stress of a serious illness.   She lives at home with her great granddaughter, Colletta Maryland. Colletta Maryland is her primary caretaker. Notably, her Granddaughter- [name] Bary Leriche is her HCPOA and that paperwork is on patient's paper chart, but not in Epic. Colletta Maryland  worries about the [facility] making decisions for the patient that involves the patient's best interests.   As far as functional and nutritional status - prior to admission patient was not ambulatory, able to stand seconds to minutes with a walker. Colletta Maryland shared that she was noticing a decreased appetite and had been started on mirtazipine. She is dependent for all ADL's.  She has much care assistance in the home provided by outside agencies.    She is active on her electronics, enjoys face-timing with her sister.   Advanced directives, concepts specific to code status, artifical feeding and hydration, and rehospitalization were considered and discussed. Colletta Maryland notes that patient reversed her code status in April. Patient has advanced directives stating no artificial life prolonging measures in the event of uncurable illness, advanced dementia, or unconscious state which is not likely to resolve. She also indicates that she would not want artificial feeding.   I evaluated Ms. Loken- she was unable to elicit to me that she had pneumonia, but was able to tell me about her swallow evaluation and her discussion with the speech therapist. We discussed that she is likely to get pneumonia again in the future- and she acknowledged this as well. We discussed what her goals of care would be and "Jacqlyn Larsen" tells me that she "wants to go home". I asked  her if she would want to go home vs staying in the hospital if it meant she would die sooner if she was at home and staying in the hospital means living longer. She replied that she feels she is dying and she would prefer to be at home and die- she worries that she might die in the hospital. I asked her if she would want to be put on "the breathing machine" if her breathing got worse. She at first said "Yes" then said she "has that machine at home". Upon further clarification- she thought I was referring to cpap/bipap. I clarified to her that I was discussing the  ventilator and having the tube in her airway- she immediately answered with clarity- "No, I don't want that". I asked her again if she would want it if she needed it to prolong her life vs the other choice of receiving comfort medications and dying. She again clearly stated that she would prefer to have comfort and "go see God". CPR was also discussed and she stated she did not want CPR. She agreed to DNR order.  I also confirmed with Jacqlyn Larsen that [name] was her health care power of attorney. Ms. Abreu confirmed that she would want [name] to be her surrogate decision maker. She noted there was some "mother-daughter" conflict between [name] and Colletta Maryland but she has confidence that [name] will make the best decisions for her. On further discussion with [name]- she wants Colletta Maryland to be included in all decision making and gives permission for her to act as a surrogate as well.    Primary Decision Maker PATIENT- with support of HCPOA- [name] Brunk     SUMMARY OF RECOMMENDATIONS-  -DNR- I called and informed both [name] and Stephanie of patient's decision -Comfort feeding -Plan to meet tomorrow with Colletta Maryland at [place] hopes to be present via cellphone -Patient would be eligible for Hospice services at home- however, this would offer less support than she currently has- if her homecare is privately paid, then she could receive both services- will discuss this further tomorrow -Advanced directives are on patient's paper chart- they have also been placed under media using Epic [place] tool.    Code Status/Advance Care Planning:  DNR  Palliative Prophylaxis:   Delirium Protocol  Additional Recommendations (Limitations, Scope, Preferences):  Full Scope Treatment  Prognosis:    Unable to determine- given patient's overall decline in function, nutrition, and changes in cognition- I am concerned that she has limited lifetime despite all aggressive measures  Discharge Planning: To Be Determined- home with  home health vs hospice vs both  Primary Diagnoses: Present on Admission: . Asthma-COPD overlap syndrome ([place]) . Acute respiratory failure with hypoxia ([place]) . Atelectasis of left lung . Bilateral pleural effusion   I have reviewed the medical record, interviewed the patient and family, and examined the patient. The following aspects are pertinent.  Past Medical History:  Diagnosis Date  . Anxiety   . Arthritis   . Asthma   . CAD (coronary artery disease) 2007   a. 1997 s/p PCI of diagonal.  . Constipation   . COPD/Asthma   . Diverticulosis   . Esophageal reflux   . Family history of adverse reaction to anesthesia    daughter had trouble breathing after surgery, had to be reintubated  . Glaucoma   . HTN (hypertension), benign   . Hyperlipidemia   . Malignant melanoma ([place])    a. 09/2012 s/p resection.  . Neuropathy  lower legs  . PAF (paroxysmal atrial fibrillation) ([facility])    a. intolerant to beta blockers due to asthma and intolerant to CCB due to rash;  b. CHA2DS2VASc = 6-->chronic coumadin;  c. 03/2014 Echo: EF 60-65%, Gr 1 DD, triv AI, mildly dil RV, PASP 22mmhg.  [name] TIA (transient ischemic attack)    a. 02/2011 - chronic coumadin in setting of PAF.  [name] Urinary, incontinence, stress female    Social History   Socioeconomic History  . Marital status: Divorced    Spouse name: Not on file  . Number of children: Not on file  . Years of education: Not on file  . Highest education level: Not on file  Occupational History  . 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   Tobacco Use  . Smoking status: Former Smoker    Packs/day: 0.20    Years: 25.00    Pack years: 5.00    Types: Cigarettes    Quit date: 07/06/1966    Years since quitting: 53.5  . Smokeless tobacco: Never Used  Vaping Use  . Vaping Use: Never used  Substance and Sexual Activity  . Alcohol use: No    Alcohol/week: 0.0 standard drinks  . Drug use: No  . Sexual activity:  Not on file  Other Topics Concern  . Not on file  Social History Narrative   Lives in [place] with her dog.  She is very independent.   Social Determinants of Health   Financial Resource Strain:   . Difficulty of Paying Living Expenses:   Food Insecurity:   . Worried About Charity fundraiser in the Last Year:   . Arboriculturist in the Last Year:   Transportation Needs:   . Film/video editor (Medical):   [name] Lack of Transportation (Non-Medical):   Physical Activity:   . Days of Exercise per Week:   . Minutes of Exercise per Session:   Stress:   . Feeling of Stress :   Social Connections:   . Frequency of Communication with Friends and Family:   . Frequency of Social Gatherings with Friends and Family:   . Attends Religious Services:   . Active Member of Clubs or Organizations:   . Attends Archivist Meetings:   [name] Marital Status:    Family History  Problem Relation Age of Onset  . Heart disease Father   . Hemachromatosis Father   . Heart attack Father   . Colon cancer Mother   . Peripheral vascular disease Mother   . Peripheral vascular disease Sister   . Cancer Sister   . Lymphoma Sister   . Heart attack Brother   . Heart disease Brother    Scheduled Meds: . chlorhexidine  15 mL Mouth Rinse BID  . Chlorhexidine Gluconate Cloth  6 each Topical Q0600  . citalopram  10 mg Oral Daily  . dexamethasone (DECADRON) injection  4 mg Intravenous Q6H  . docusate sodium  100 mg Oral BID  . enoxaparin (LOVENOX) injection  1 mg/kg Subcutaneous Q12H  . feeding supplement (ENSURE ENLIVE)  237 mL Oral BID BM  . fluticasone furoate-vilanterol  1 puff Inhalation Daily  . ipratropium-albuterol  3 mL Nebulization Q6H  . mouth rinse  15 mL Mouth Rinse q12n4p  . metoprolol tartrate  5 mg Intravenous Q8H  . montelukast  10 mg Oral QHS  . multivitamin with minerals  1 tablet Oral Daily  . pantoprazole (PROTONIX)  IV  40 mg Intravenous Daily  . polyethylene glycol  17 g Oral  Daily  . sodium chloride flush  3 mL Intravenous Q12H  . warfarin  5 mg Oral ONCE-1600  . Warfarin - Pharmacist Dosing Inpatient   Does not apply q1600   Continuous Infusions: . sodium chloride 20 mL/hr at 01/15/20 1900   PRN Meds:.acetaminophen **OR** acetaminophen, albuterol, ALPRAZolam, guaiFENesin, [DISCONTINUED] ondansetron **OR** ondansetron (ZOFRAN) IV, senna-docusate Medications Prior to Admission:  Prior to Admission medications   Medication Sig Start Date End Date Taking? Authorizing Provider  acetaminophen (TYLENOL) 325 MG tablet Take 2 tablets (650 mg total) by mouth every 6 (six) hours as needed for mild pain (or Fever >/= 101). 10/31/19  Yes Elgergawy, Silver Huguenin, MD  albuterol (PROAIR HFA) 108 (90 Base) MCG/ACT inhaler Inhale 1-2 puffs into the lungs every 4 (four) hours as needed for wheezing or shortness of breath. Patient taking differently: Inhale 1-2 puffs into the lungs daily.  12/11/15  Yes Juanito Doom, MD  albuterol (PROVENTIL) (2.5 MG/3ML) 0.083% nebulizer solution Take 3 mLs (2.5 mg total) by nebulization every 6 (six) hours as needed for wheezing or shortness of breath. Patient taking differently: Take 2.5 mg by nebulization daily.  07/29/18  Yes Juanito Doom, MD  ALPRAZolam Duanne Moron) 0.5 MG tablet Take 1 tablet (0.5 mg total) by mouth 2 (two) times daily as needed for anxiety. 10/31/19  Yes Elgergawy, Silver Huguenin, MD  amLODipine (NORVASC) 10 MG tablet Take 10 mg by mouth daily. 12/03/19  Yes [provider]  cetirizine (ZYRTEC) 10 MG tablet Take 10 mg by mouth daily as needed for allergies.   Yes [provider]  citalopram (CELEXA) 10 MG tablet Take 10 mg by mouth daily.   Yes [provider]  clotrimazole-betamethasone (LOTRISONE) cream Apply 1 application topically 2 (two) times daily as needed for skin breakdown. 07/13/19  Yes [provider]  feeding supplement, ENSURE ENLIVE, (ENSURE ENLIVE) LIQD Take 237 mLs by mouth 3 (three)  times daily between meals. Patient taking differently: Take 237 mLs by mouth daily.  10/31/19  Yes Elgergawy, Silver Huguenin, MD  fluticasone (FLONASE) 50 MCG/ACT nasal spray USE 1 SPRAY IN EACH NOSTRIL ONCE A DAY AS NEEDED FOR ALLERGIES OR RHINITIS Patient taking differently: Place 1 spray into both nostrils daily.  04/25/19  Yes Juanito Doom, MD  fluticasone furoate-vilanterol (BREO ELLIPTA) 200-25 MCG/INH AEPB Inhale 1 puff into the lungs daily. 01/27/17  Yes Juanito Doom, MD  furosemide (LASIX) 20 MG tablet Take 1 tablet (20 mg total) by mouth daily. 11/01/19  Yes Elgergawy, Silver Huguenin, MD  latanoprost (XALATAN) 0.005 % ophthalmic solution Place 1 drop into both eyes at bedtime.   Yes [provider]  lisinopril (ZESTRIL) 40 MG tablet Take 40 mg by mouth daily. 12/29/19  Yes [provider]  metoprolol tartrate (LOPRESSOR) 25 MG tablet Take 1 tablet (25 mg total) by mouth 4 (four) times daily. 10/31/19  Yes Elgergawy, Silver Huguenin, MD  mirtazapine (REMERON SOL-TAB) 15 MG disintegrating tablet Take 1 tablet (15 mg total) by mouth at bedtime. 10/31/19 10/30/20 Yes Elgergawy, Silver Huguenin, MD  montelukast (SINGULAIR) 10 MG tablet TAKE 1 TABLET BY MOUTH EVERYDAY AT BEDTIME Patient taking differently: Take 10 mg by mouth at bedtime.  05/29/19  Yes Martyn Ehrich, NP  Multiple Vitamins-Minerals (MULTIVITAMIN & MINERAL PO) Take 1 tablet by mouth daily.   Yes [provider]  omalizumab [name]) 150 MG/ML prefilled syringe Inject 300  mg into the skin every 14 (fourteen) days. 01/01/20  Yes Spero Geralds, MD  omalizumab [name]) 75 MG/0.5ML prefilled syringe Inject 75 mg into the skin every 14 (fourteen) days. 01/01/20  Yes Spero Geralds, MD  pilocarpine (PILOCAR) 1 % ophthalmic solution Place 1 drop into both eyes 3 (three) times daily.  12/30/17  Yes [provider]  warfarin (COUMADIN) 2 MG tablet Take 1 tablet (2 mg total) by mouth daily. Start 11/02/2019 Patient taking  differently: Take 2 mg by mouth in the morning and at bedtime. 2mg  am  2mg  pm. 11/02/19 11/01/20 Yes Elgergawy, Silver Huguenin, MD  loperamide (IMODIUM A-D) 2 MG tablet Take 2 mg by mouth daily as needed for diarrhea or loose stools. Patient not taking: Reported on 01/08/2020    [provider]   Allergies  Allergen Reactions  . Alphagan [Brimonidine] Other (See Comments)    Burning sensation  . Apraclonidine Other (See Comments)    Pain, brow pain, tender, not able to tolerate  . Biaxin [Clarithromycin] Other (See Comments)    Unknown, per pt   . Brovana [Arformoterol] Other (See Comments)    Makes my heart race  . Budesonide Other (See Comments)    Makes my heart race  . Cifenline Itching  . Clonidine Other (See Comments)    unknown  . Clonidine Derivatives Other (See Comments)    REACTION: unknown  . Dorzolamide Other (See Comments)    Redness  . Iodinated Diagnostic Agents Other (See Comments)    unknown  . Iohexol Other (See Comments)     Desc: NOTES FROM PRIOR CT STATES PRE MEDICATION PRIOR TO OMNIPAQUE ENHANCED CT   . Nsaids Other (See Comments)    Unknown  . Penicillins Itching, Swelling and Other (See Comments)    Swelling and edema of ears and rash Swelling to ears  Has patient had a PCN reaction causing immediate rash, facial/tongue/throat swelling, SOB or lightheadedness with hypotension: {no Has patient had a PCN reaction causing severe rash involving mucus membranes or skin necrosis: {no Has patient had a PCN reaction that required hospitalization no Has patient had a PCN reaction occurring within the last 10 years: no If all of the above answers are "NO", then may proceed with Cephalosporin use.  . Sulfamethoxazole Other (See Comments)    Unknown  . Sulfonamide Derivatives Itching  . Tetracycline Itching and Other (See Comments)    unknown  . Timolol Other (See Comments)    Eye red and irritated   . Vancomycin Other (See Comments)    Turn red  . Zetia  [Ezetimibe] Other (See Comments)    Unknown  . Cardizem [Diltiazem Hcl] Rash   Review of Systems  Constitutional: Positive for activity change, appetite change, fatigue and unexpected weight change.  Respiratory: Positive for shortness of breath.     Physical Exam Constitutional:      Comments: Extremely frail, appears cachetic  Skin:    Comments: Hematomas on bilateral lower extremities just above wrists  Neurological:     Comments: Oriented to person and place     Vital Signs: BP 123/68 (BP Location: Right Arm)   Pulse (!) 107   Temp 97.9 F (36.6 C)   Resp 16   Ht 5\' 6"  (1.676 m)   Wt 49.9 kg   SpO2 98%   BMI 17.76 kg/m  Pain Scale: 0-10 POSS *See Group Information*: 1-Acceptable,Awake and alert Pain Score: 0-No pain   SpO2: SpO2: 98 % O2 Device:SpO2:  98 % O2 Flow Rate: .O2 Flow Rate (L/min): 2 L/min  IO: Intake/output summary:   Intake/Output Summary (Last 24 hours) at 01/16/2020 1141 Last data filed at 01/16/2020 0450 Gross per 24 hour  Intake 644.72 ml  Output 290 ml  Net 354.72 ml    LBM: Last BM Date: 01/16/20 Baseline Weight: Weight: 52.2 kg Most recent weight: Weight: 49.9 kg     Palliative Assessment/Data: Palliative Performance Score: 30%     Thank you for this consult. Palliative medicine will continue to follow and assist as needed.   Time In: 1100 Time Out: 1330 Time Total: 150 minutes Greater than 50%  of this time was spent counseling and coordinating care related to the above assessment and plan.  Signed by: Mariana Kaufman, AGNP-C Palliative Medicine    Please contact Palliative Medicine Team phone at [phone] for questions and concerns.  For individual provider: See Shea Evans

## 2020-01-16 NOTE — Progress Notes (Signed)
ANTICOAGULATION CONSULT NOTE - Consult  Pharmacy Consult:  Coumadin Indication: atrial fibrillation  Allergies  Allergen Reactions  . Alphagan [Brimonidine] Other (See Comments)    Burning sensation  . Apraclonidine Other (See Comments)    Pain, brow pain, tender, not able to tolerate  . Biaxin [Clarithromycin] Other (See Comments)    Unknown, per pt   . Brovana [Arformoterol] Other (See Comments)    Makes my heart race  . Budesonide Other (See Comments)    Makes my heart race  . Cifenline Itching  . Clonidine Other (See Comments)    unknown  . Clonidine Derivatives Other (See Comments)    REACTION: unknown  . Dorzolamide Other (See Comments)    Redness  . Iodinated Diagnostic Agents Other (See Comments)    unknown  . Iohexol Other (See Comments)     Desc: NOTES FROM PRIOR CT STATES PRE MEDICATION PRIOR TO OMNIPAQUE ENHANCED CT   . Nsaids Other (See Comments)    Unknown  . Penicillins Itching, Swelling and Other (See Comments)    Swelling and edema of ears and rash Swelling to ears  Has patient had a PCN reaction causing immediate rash, facial/tongue/throat swelling, SOB or lightheadedness with hypotension: {no Has patient had a PCN reaction causing severe rash involving mucus membranes or skin necrosis: {no Has patient had a PCN reaction that required hospitalization no Has patient had a PCN reaction occurring within the last 10 years: no If all of the above answers are "NO", then may proceed with Cephalosporin use.  . Sulfamethoxazole Other (See Comments)    Unknown  . Sulfonamide Derivatives Itching  . Tetracycline Itching and Other (See Comments)    unknown  . Timolol Other (See Comments)    Eye red and irritated   . Vancomycin Other (See Comments)    Turn red  . Zetia [Ezetimibe] Other (See Comments)    Unknown  . Cardizem [Diltiazem Hcl] Rash    Patient Measurements: Height: 5\' 6"  (167.6 cm) Weight: 49.9 kg (110 lb 0.2 oz) IBW/kg (Calculated) :  59.3  Vital Signs: Temp: 97.9 F (36.6 C) (07/13 0721) Temp Source: Oral (07/13 0450) BP: 123/68 (07/13 0721) Pulse Rate: 107 (07/13 0721)  Labs: Recent Labs    01/14/20 0416 01/15/20 0216 01/16/20 0338  HGB 8.6*  --  8.7*  HCT 26.1*  --  28.5*  PLT 110*  --  PLATELET CLUMPS NOTED ON SMEAR, UNABLE TO ESTIMATE  LABPROT 16.3* 16.1* 19.8*  INR 1.4* 1.3* 1.8*  CREATININE 0.71  --  0.81    Estimated Creatinine Clearance: 33.5 mL/min (by C-G formula based on SCr of 0.81 mg/dL).  Assessment: 55 YOF presented with SOB and confusion, found to have left lung collapse and pleural effusion now s/p bronch, thoracentesis and chest tube insertion.  Patient has a history of Afib (CHADSVASC = 6) on Coumadin PTA.   There is discrepancy regarding home Coumadin regimen and how patient was taking it.  Home regimen listed as 2mg  PO daily and patient might have been taking 2mg  PO BID.  Warfarin not given last night d/t patient NPO on bipap  Goal of Therapy:  INR 2-3 Monitor platelets by anticoagulation protocol: Yes   Plan:  warfarin 5mg  PO today - pending MBS Continue enox 1 mg/kg q12h F/u INR and dc enox when INR > 2  Barth Kirks, PharmD, BCPS, BCCCP Clinical Pharmacist [phone]  Please check AMION for all [place] numbers  01/16/2020 9:34 AM

## 2020-01-16 NOTE — TOC Benefit Eligibility Note (Signed)
Transition of Care [facility]) Benefit Eligibility Note    Patient Details  Name: [name] MRN: [id number] Date of Birth: [birth year]   Medication/Dose: Eliquis 2.76m and or 558m 30 day supply bid  Covered?: Yes  Tier: 3 Drug  Prescription Coverage Preferred Pharmacy: [facility]  Spoke with Person/Company/Phone Number:: SaMinerva Festerh# [phone]-Pay: $9.20  Prior Approval: No  Deductible: Met       HaShelda Alteshone Number: 01/16/2020, 10:28 AM

## 2020-01-16 NOTE — Progress Notes (Signed)
Modified Barium Swallow Progress Note  Patient Details  Name: [name] MRN: [id number] Date of Birth: 1924/04/08  Today's Date: 01/16/2020  Modified Barium Swallow completed.  Full report located under Chart Review in the Imaging Section.  Brief recommendations include the following:  Clinical Impression  Pt continues to exhibit significant dysphagia as present April 2021. She can achieve laryngeal closure to protect airway but it is late and thin and necar barium have already intruded laryngeal vestibule to vocal cords and aspiration below, silently. Pt's cognition is appropriate and able to demonstrate chin tuck however aspiration ensued. Min-mild vallecular residue present but not problematic. Significant stasis from mid to distal esophagus not clearing during duration of MBS. Discussed options of nectar thick which pt did not continue in April and comfort feeds. Understandably pt stated "at my age I choose thin." Discussed with MD who placed a Palliative care consult this moring. Will order regular texture, thin liquids, pills crushed, remain upright after meals. ST will follow up minimum x 1 for further education or answer questions if needed.       Swallow Evaluation Recommendations       SLP Diet Recommendations: Other (Comment) (pt chooses comfort feeds- rec, Dys 3/thin)   Liquid Administration via: Cup;Straw   Medication Administration: Whole meds with puree   Supervision: Staff to assist with self feeding;Full supervision/cueing for compensatory strategies   Compensations: Slow rate;Small sips/bites;Clear throat intermittently   Postural Changes: Remain semi-upright after after feeds/meals (Comment);Seated upright at 90 degrees   Oral Care Recommendations: Oral care BID        Houston Siren 01/16/2020,11:08 AM  Orbie Pyo Colvin Caroli.Ed Risk analyst [phone] Office [phone]

## 2020-01-16 NOTE — Telephone Encounter (Signed)
Spoke with pt's great granddaughter, Colletta Maryland. Pt's Xolair appointment has been moved to 01/23/20 at 1600. Nothing further was needed.

## 2020-01-16 NOTE — Progress Notes (Signed)
Patient with increased confusion and agitation throughout the night. Per nurse that cared for her last, her behavior was not like this. Patient is pulling off leads, disconnecting from monitor and refusing to be put back on. States that "she has been kidnapped". Attempted to reorient patient with no success, called patient's great granddaughter with no answer. Will continue to monitor.

## 2020-01-16 NOTE — Progress Notes (Signed)
Nutrition Follow-up  DOCUMENTATION CODES:   Severe malnutrition in context of chronic illness  INTERVENTION:    Ensure Enlive po daily, each supplement provides 350 kcal and 20 grams of protein  Magic cup BID with meals, each supplement provides 290 kcal and 9 grams of protein  NUTRITION DIAGNOSIS:   Severe Malnutrition related to chronic illness (COPD/dysphagia) as evidenced by severe fat depletion, severe muscle depletion.  Ongoing  GOAL:   Patient will meet greater than or equal to 90% of their needs  Diet just advanced   MONITOR:   PO intake, Supplement acceptance, Weight trends, Labs, I & O's  REASON FOR ASSESSMENT:   Ventilator    ASSESSMENT:   86 yo female admitted with acute on chronic anemia, developed respiratory failure requiring intubation. PMH includes COPD, HTN, asthma, CAD, osteomyelitis s/p R BKA (10/2019), hx of dysphagia on thickened liquids   7/8- L chest tube placement  7/11- thoracentesis, extubated   Palliative met with family this am. Diet changed to regular. Plan for comfort feedings. RD to provide supplementation as pt enjoys Ensure.   Admission weight: 52.2 kg  Current weight: 49.9 kg   Medications: colace, MVI, miralax Labs: CBG 95-`07  NUTRITION - FOCUSED PHYSICAL EXAM:    Most Recent Value  Orbital Region Severe depletion  Upper Arm Region Severe depletion  Thoracic and Lumbar Region Unable to assess  Buccal Region Severe depletion  Temple Region Severe depletion  Clavicle Bone Region Severe depletion  Clavicle and Acromion Bone Region Severe depletion  Scapular Bone Region Unable to assess  Dorsal Hand Severe depletion  Patellar Region Severe depletion  Anterior Thigh Region Severe depletion  Posterior Calf Region Severe depletion  Edema (RD Assessment) None  Hair Reviewed  Eyes Reviewed  Mouth Reviewed  Skin Reviewed  Nails Reviewed     Diet Order:   Diet Order            Diet regular Room service appropriate?  Yes with Assist; Fluid consistency: Thin  Diet effective now                 EDUCATION NEEDS:   Not appropriate for education at this time  Skin:  Skin Assessment: Skin Integrity Issues: Skin Integrity Issues:: Stage II, Other (Comment) Stage I: sacrum Stage II: sacrum Other: non pressure related wound on foot  Last BM:  7/13  Height:   Ht Readings from Last 1 Encounters:  01/11/20 5' 6" (1.676 m)    Weight:   Wt Readings from Last 1 Encounters:  01/15/20 49.9 kg    BMI:  Body mass index is 17.76 kg/m.  Estimated Nutritional Needs:   Kcal:  1550-1750 kcal  Protein:  80-95 grams  Fluid:  >/= 1.5 L/day   Mariana Single RD, LDN Clinical Nutrition Pager listed in [place]

## 2020-01-16 NOTE — Progress Notes (Signed)
[facility] Progress Note Patient Name: [name] DOB: 20-May-1924 MRN: [id number]   Date of Service  01/16/2020  HPI/Events of Note  Delirium - Refusing to take PO meds.   eICU Interventions  Plan: 1. Versed 0.5 mg IV X 1 now.      Intervention Category Major Interventions: Delirium, psychosis, severe agitation - evaluation and management  Roverto Bodmer Eugene 01/16/2020, 6:51 AM

## 2020-01-16 NOTE — Progress Notes (Addendum)
NAME:  [name], MRN:  [id number], DOB:  06/11/1924, LOS: 7 ADMISSION DATE:  01/08/2020, CONSULTATION DATE:  01/10/2020 REFERRING MD: Graciella Freer, CHIEF COMPLAINT:  Resp distress  Past Medical History  Asthma/ COPD overlap, Afib on warfarin, HTN, HLD, CAD, osteomyelitis s/p R BKA (10/26/2019), anxiety, dysphagia, GERD,   Significant Hospital Events   7/5 Admit 7/7 PCCM consult/ resp arrest- intubated over night 7/8 bronch followed by thoracentesis. Iatrogenic PTX.  7/11 Right thoracentesis, extubated  Consults:  PCCM  Procedures:  7/8  > 7/11 ETT 7/8 bronch   Significant Diagnostic Tests:  7/7 CT chest >>  Opacification of the left hemithorax secondary to complete consolidation and collapse of the left lung with associated large pleural effusion. Considerable dense material is noted within the bronchial tree on the left likely related to aspiration and/or mucous plugging. Large right-sided pleural effusion with right upper lobe infiltrate and right lower lobe consolidation. Mild inspissated material is noted in the lower lobe bronchi on right as well. Aneurysmal dilatation of the ascending aorta as described.   Scattered small likely reactive lymph nodes in the hila and mediastinum.  Micro Data:  7/5 SARS 2 >> neg 7/8 MRSA PCR >>  7/8 BAL >> MSSA  Antimicrobials:  7/7 azithro >>stopped 7/7 ceftriaxone >> to complete on 7/13 for 7 day course   Interim history/subjective:  No acute distress.  She has failed her swallowing evaluation.  Objective   Blood pressure 123/68, pulse (!) 107, temperature 97.9 F (36.6 C), resp. rate 16, height _0  (1.676 m), weight 49.9 kg, SpO2 98 %.        Intake/Output Summary (Last 24 hours) at 01/16/2020 0926 Last data filed at 01/16/2020 0450 Gross per 24 hour  Intake 659.69 ml  Output 290 ml  Net 369.69 ml   Filed Weights   01/13/20 0500 01/14/20 0500 01/15/20 0500  Weight: 56.9 kg 57.3 kg 49.9 kg   Examination: General frail  elderly female with dull effect no acute distress HEENT no JVD is appreciated Chest decreased breath sounds throughout left chest tube in place currently on waterseal without air leak noted to be kyphoscoliotic Heart sounds are regular Abdomen soft nontender Extremities right BKA is noted left foot second toe with ulcer   Resolved Hospital Problem list    Assessment & Plan:   Acute hypoxic respiratory failure, CAP some combination aspiration, fluid overload, improving s/p thoracentesis for pneumothorax, resolved on CXR. Off ventilator Wean FiO2 Chest tube to waterseal without noticeable leak No chest x-ray on 01/16/2020 we will order 1  Cough or follow chest tube with one thing not mentioned resident with vein Hx Dysphagia Positive for aspiration on swallowing well Questionable PEG  Afib on home coumadin Currently on telemetry Rate control as needed Most likely she will need Lovenox She was on Coumadin at home Resume Metroprolol    Protein calorie malnutrition  Currently n.p.o. swallow eval was remarkable for requiring n.p.o.  Sacral decub- stage II Wound ostomy care  Best practice:  Diet: NPO for now, pending recommendations from SLP  Pain/Anxiety/Delirium protocol (if indicated): wean off precedex  VAP protocol (if indicated): yes DVT prophylaxis: on coumadin  GI prophylaxis: PPI Glucose control: SSI Mobility: BR Code Status: Full, at this time family wants full aggressive therapies Family Communication: updated patient's granddaughter via telephone 7/12.  Disposition: ICU until weaned from precedex   Richardson Landry Aviv Rota ACNP Acute Care Nurse Practitioner [place] Please consult [place] 01/16/2020, 9:26 AM

## 2020-01-16 NOTE — Progress Notes (Signed)
PROGRESS NOTE    [name]  GUR:[id number] DOB: [birth year] DOA: 01/08/2020 PCP: Harrison Mons, PA   Brief Narrative:  [name] is 86yoF w/ Afib (on Coumadin), HTN, asthma, CAD, and hx of osteomyelitis s/p R BKA (10/26/2019) who presented to ED via EMS with SOB, weakness for 1 week.  She does not need supplemental oxygen at baseline. Denied fevers, chills, myalgias, hemoptysis or chest pain or any other complaint. In addition, patient has experienced gradual cognitive decline over past year or so. Family says patient has acutely worsened over the last week. Reportedly has attempted to call police on family and repeatedly hiding her inhalers and suggesting family is trying to kill her.  Upon arrival via EMS, afebrile, tachycardic, tachypneic, sating 97% on 2L [place]. Labs notable for anemia, no leukocytosis, elevated INR. FOBT neg. In setting of new symptomatic anemia in patient on [facility], IMTS consulted for admission  Assessment & Plan:   Principal Problem:   Acute respiratory failure with hypoxia ([place]) Active Problems:   Asthma-COPD overlap syndrome (HCC)   Atrial fibrillation (HCC)   Acute on chronic anemia   Atelectasis of left lung   Bilateral pleural effusion   Acute hypoxic respiratory failure, CAP some combination aspiration, fluid overload, improving s/p thoracentesis for pneumothorax, resolved on CXR. Off ventilator Wean FiO2, currently on 2 L. Chest tube to waterseal without noticeable leak.  Managed by PCCM.  Dysphagia: Failed swallow evaluation today.  Remains n.p.o. per speech therapy.  However per speech therapist, patient had expressed her wishes to eat anything due to her age and she wanted to be comfortable.  I have consulted palliative care to discuss [place] with patient and family.  She has been placed on regular diet per her request.  Afib on home coumadin: Rate is slightly elevated.  She is on Lopressor IV 5 mg every 12.  I have increased the frequency  to every 8.  Monitor on telemetry.  Currently on weight-based Lovenox therapeutic dose.  Protein calorie malnutrition  Currently n.p.o. swallow eval was remarkable for requiring n.p.o.  Sacral decub- stage II: Wound care on board.  DVT prophylaxis: SCDs Start: 01/08/20 2317, weight-based Lovenox.   Code Status: Full Code  Family Communication:  None present at bedside.  Plan of care discussed with patient in length and he verbalized understanding and agreed with it.  Status is: Inpatient  Remains inpatient appropriate because:Inpatient level of care appropriate due to severity of illness   Dispo: The patient is from: Home              Anticipated d/c is to: SNF              Anticipated d/c date is: 2 days              Patient currently is not medically stable to d/c.        Estimated body mass index is 17.76 kg/m as calculated from the following:   Height as of this encounter: _0  (1.676 m).   Weight as of this encounter: 49.9 kg.  Pressure Injury 08/08/19 Sacrum Stage 1 -  Intact skin with non-blanchable redness of a localized area usually over a bony prominence. (Active)  08/08/19 1737  Location: Sacrum  Location Orientation:   Staging: Stage 1 -  Intact skin with non-blanchable redness of a localized area usually over a bony prominence.  Wound Description (Comments):   Present on Admission: Yes     Pressure Injury 01/10/20 Sacrum Posterior;Medial Stage  2 -  Partial thickness loss of dermis presenting as a shallow open injury with a red, pink wound bed without slough. (Active)  01/10/20 1900  Location: Sacrum  Location Orientation: Posterior;Medial  Staging: Stage 2 -  Partial thickness loss of dermis presenting as a shallow open injury with a red, pink wound bed without slough.  Wound Description (Comments):   Present on Admission: Yes     Nutritional status:  Nutrition Problem: Inadequate oral intake Etiology: acute illness   Signs/Symptoms: NPO  status   Interventions: Tube feeding    Consultants:   PCCM  Procedures:   Intubation and chest tube placement  Antimicrobials:  Anti-infectives (From admission, onward)   Start     Dose/Rate Route Frequency Ordered Stop   01/10/20 2000  cefTRIAXone (ROCEPHIN) 1 g in sodium chloride 0.9 % 100 mL IVPB        1 g 200 mL/hr over 30 Minutes Intravenous Every 24 hours 01/10/20 1837 01/14/20 2106   01/10/20 2000  azithromycin (ZITHROMAX) 500 mg in sodium chloride 0.9 % 250 mL IVPB  Status:  Discontinued        500 mg 250 mL/hr over 60 Minutes Intravenous Every 24 hours 01/10/20 1837 01/12/20 1134         Subjective: Patient seen and examined this morning.  Events from last night noted when she was agitated but this morning, she was alert and oriented x2 and was calm and pleasant.  She complained of weakness and some shortness of breath.  Objective: Vitals:   01/15/20 2326 01/16/20 0450 01/16/20 0721 01/16/20 0819  BP: 120/63 129/76 123/68   Pulse: (!) 104 (!) 105 (!) 107   Resp: _0 Temp: 97.8 F (36.6 C) 98 F (36.7 C) 97.9 F (36.6 C)   TempSrc: Oral Oral    SpO2: 97% 100% 100% 98%  Weight:      Height:        Intake/Output Summary (Last 24 hours) at 01/16/2020 1256 Last data filed at 01/16/2020 0450 Gross per 24 hour  Intake 185.57 ml  Output 290 ml  Net -104.43 ml   Filed Weights   01/13/20 0500 01/14/20 0500 01/15/20 0500  Weight: 56.9 kg 57.3 kg 49.9 kg    Examination:  General exam: Appears very weak Respiratory system: Coarse breath sounds intermittently. Respiratory effort normal. Cardiovascular system: S1 & S2 heard, irregularly irregular rate and rhythm, no JVD, murmurs, rubs, gallops or clicks. No pedal edema. Gastrointestinal system: Abdomen is nondistended, soft and nontender. No organomegaly or masses felt. Normal bowel sounds heard. Central nervous system: Alert and oriented x2. No focal neurological deficits. Extremities: Symmetric 5  x 5 power.  Data Reviewed: I have personally reviewed following labs and imaging studies  CBC: Recent Labs  Lab 01/10/20 0233 01/10/20 0233 01/11/20 0010 01/11/20 0322 01/11/20 0337 01/14/20 0416 01/16/20 0338  WBC 5.6  --  9.0  --  7.3 6.5 12.1*  NEUTROABS  --   --   --   --   --   --  10.8*  HGB 8.7*   < > 9.7* 9.5* 9.3* 8.6* 8.7*  HCT 27.6*   < > 32.1* 28.0* 29.9* 26.1* 28.5*  MCV 89.0  --  91.7  --  89.0 85.9 88.2  PLT 133*  --  168  --  150 110* PLATELET CLUMPS NOTED ON SMEAR, UNABLE TO ESTIMATE   < > = values in this interval not displayed.   Basic Metabolic Panel:  Recent Labs  Lab 01/10/20 0233 01/11/20 0322 01/11/20 0337 01/11/20 1723 01/12/20 0358 01/12/20 1638 01/12/20 2035 01/14/20 0416 01/16/20 0338  NA 130* 133* 134*  --   --   --   --  134* 137  K 4.4 3.9 4.3  --   --   --   --  3.9 3.9  CL 96*  --  95*  --   --   --   --  101 100  CO2 26  --  27  --   --   --   --  22 23  GLUCOSE 98  --  136*  --   --   --   --  114* 107*  BUN 21  --  24*  --   --   --   --  23 35*  CREATININE 0.76  --  0.94  --   --   --   --  0.71 0.81  CALCIUM 8.5*  --  8.7*  --   --   --   --  7.8* 9.3  MG  --   --   --  1.6* 1.6* 2.8*  --   --   --   PHOS  --   --   --  1.9* 1.6*  --  5.7*  --   --    GFR: Estimated Creatinine Clearance: 33.5 mL/min (by C-G formula based on SCr of 0.81 mg/dL). Liver Function Tests: Recent Labs  Lab 01/14/20 0416  AST 17  ALT 19  ALKPHOS 45  BILITOT 0.9  PROT 5.3*  ALBUMIN 2.5*   No results for input(s): LIPASE, AMYLASE in the last 168 hours. No results for input(s): AMMONIA in the last 168 hours. Coagulation Profile: Recent Labs  Lab 01/12/20 0358 01/13/20 0339 01/14/20 0416 01/15/20 0216 01/16/20 0338  INR 2.0* 1.6* 1.4* 1.3* 1.8*   Cardiac Enzymes: No results for input(s): CKTOTAL, CKMB, CKMBINDEX, TROPONINI in the last 168 hours. BNP (last 3 results) No results for input(s): PROBNP in the last 8760 hours. HbA1C: No  results for input(s): HGBA1C in the last 72 hours. CBG: Recent Labs  Lab 01/15/20 2014 01/15/20 2326 01/16/20 0325 01/16/20 0725 01/16/20 1131  GLUCAP 95 103* 107* 103* 95   Lipid Profile: No results for input(s): CHOL, HDL, LDLCALC, TRIG, CHOLHDL, LDLDIRECT in the last 72 hours. Thyroid Function Tests: No results for input(s): TSH, T4TOTAL, FREET4, T3FREE, THYROIDAB in the last 72 hours. Anemia Panel: No results for input(s): VITAMINB12, FOLATE, FERRITIN, TIBC, IRON, RETICCTPCT in the last 72 hours. Sepsis Labs: No results for input(s): PROCALCITON, LATICACIDVEN in the last 168 hours.  Recent Results (from the past 240 hour(s))  SARS Coronavirus 2 by RT PCR (hospital order, performed in [facility] hospital lab) Nasopharyngeal Nasopharyngeal Swab     Status: None   Collection Time: 01/08/20  5:13 PM   Specimen: Nasopharyngeal Swab  Result Value Ref Range Status   SARS Coronavirus 2 NEGATIVE NEGATIVE Final    Comment: (NOTE) SARS-CoV-2 target nucleic acids are NOT DETECTED.  The SARS-CoV-2 RNA is generally detectable in upper and lower respiratory specimens during the acute phase of infection. The lowest concentration of SARS-CoV-2 viral copies this assay can detect is 250 copies / mL. A negative result does not preclude SARS-CoV-2 infection and should not be used as the sole basis for treatment or other patient management decisions.  A negative result may occur with improper specimen collection / handling, submission of specimen other than  nasopharyngeal swab, presence of viral mutation(s) within the areas targeted by this assay, and inadequate number of viral copies (<250 copies / mL). A negative result must be combined with clinical observations, patient history, and epidemiological information.  Fact Sheet for Patients:   StrictlyIdeas.no  Fact Sheet for Healthcare Providers: BankingDealers.co.za  This test is not yet  approved or  cleared by the [place] FDA and has been authorized for detection and/or diagnosis of SARS-CoV-2 by FDA under an Emergency Use Authorization (EUA).  This EUA will remain in effect (meaning this test can be used) for the duration of the COVID-19 declaration under Section 564(b)(1) of the Act, 21 U.S.C. section 360bbb-3(b)(1), unless the authorization is terminated or revoked sooner.  Performed at [facility], [place] [address] [place], [place] [address]   MRSA PCR Screening     Status: None   Collection Time: 01/11/20 12:30 AM   Specimen: Nasal Mucosa; Nasopharyngeal  Result Value Ref Range Status   MRSA by PCR NEGATIVE NEGATIVE Final    Comment:        The GeneXpert MRSA Assay (FDA approved for NASAL specimens only), is one component of a comprehensive MRSA colonization surveillance program. It is not intended to diagnose MRSA infection nor to guide or monitor treatment for MRSA infections. Performed at [facility], [place] [address], [place], [place] [address]   Culture, bal-quantitative     Status: Abnormal   Collection Time: 01/11/20  2:27 AM   Specimen: Bronchoalveolar Lavage; Respiratory  Result Value Ref Range Status   Specimen Description BRONCHIAL ALVEOLAR LAVAGE  Final   Special Requests NONE  Final   Gram Stain   Final    RARE WBC PRESENT, PREDOMINANTLY PMN ABUNDANT GRAM POSITIVE COCCI IN PAIRS IN CHAINS FEW GRAM NEGATIVE RODS Performed at [facility], [place] [address] [place], [place] [address]    Culture 40,000 COLONIES/mL STAPHYLOCOCCUS AUREUS (A)  Final   Report Status 01/14/2020 FINAL  Final   Organism ID, Bacteria STAPHYLOCOCCUS AUREUS (A)  Final      Susceptibility   Staphylococcus aureus - MIC*    CIPROFLOXACIN <=0.5 SENSITIVE Sensitive     ERYTHROMYCIN <=0.25 SENSITIVE Sensitive     GENTAMICIN <=0.5 SENSITIVE Sensitive     OXACILLIN <=0.25 SENSITIVE Sensitive     TETRACYCLINE <=1 SENSITIVE Sensitive     VANCOMYCIN  <=0.5 SENSITIVE Sensitive     TRIMETH/SULFA <=10 SENSITIVE Sensitive     CLINDAMYCIN <=0.25 SENSITIVE Sensitive     RIFAMPIN <=0.5 SENSITIVE Sensitive     Inducible Clindamycin NEGATIVE Sensitive     * 40,000 COLONIES/mL STAPHYLOCOCCUS AUREUS  Body fluid culture     Status: None   Collection Time: 01/11/20 10:12 AM   Specimen: Pleura; Body Fluid  Result Value Ref Range Status   Specimen Description PLEURAL FLUID  Final   Special Requests LEFT PLEURAL  Final   Gram Stain   Final    WBC PRESENT,BOTH PMN AND MONONUCLEAR NO ORGANISMS SEEN CYTOSPIN SMEAR    Culture   Final    NO GROWTH Performed at [facility], [address], [place], [place] [address]    Report Status 01/14/2020 FINAL  Final      Radiology Studies: DG Chest 1 View  Result Date: 01/16/2020 CLINICAL DATA:  Pneumothorax EXAM: CHEST  1 VIEW COMPARISON:  01/15/2020 FINDINGS: Left chest tube remains in place at the left base, unchanged. No visible pneumothorax. Cardiomegaly. Hyperinflation/COPD. Continued left lower lobe  atelectasis or infiltrate. Nodular density in the left mid lung is similar to prior study. Right basilar atelectasis or infiltrate also unchanged. Aortic atherosclerosis. No acute bony abnormality. IMPRESSION: No visible pneumothorax. Continued bibasilar opacities, left greater than right. Nodular density in the left mid lung. Cardiomegaly, COPD. Electronically Signed   By: Rolm Baptise M.D.   On: 01/16/2020 10:30   DG Chest 1 View  Result Date: 01/15/2020 CLINICAL DATA:  Shortness of breath. EXAM: CHEST  1 VIEW COMPARISON:  01/14/2020 FINDINGS: The heart is enlarged but stable. Stable tortuosity and calcification of the thoracic aorta. Stable left-sided chest tube. No pneumothorax or large pleural effusion. Slightly progressive bibasilar density which could be atelectasis or infiltrate. No pulmonary edema. IMPRESSION: 1. Stable left-sided chest tube. No pneumothorax or large effusion. 2. Slightly  progressive bibasilar atelectasis or infiltrate. Electronically Signed   By: Marijo Sanes M.D.   On: 01/15/2020 07:35   DG Swallowing Func-Speech Pathology  Result Date: 01/16/2020 Objective Swallowing Evaluation: Type of Study: MBS-Modified Barium Swallow Study  Patient Details Name: JAYDY FITZHENRY MRN: [id number] Date of Birth: 1924/03/11 Today's Date: 01/16/2020 Time: SLP Start Time (ACUTE ONLY): 1001 -SLP Stop Time (ACUTE ONLY): 1014 SLP Time Calculation (min) (ACUTE ONLY): 13 min Past Medical History: Past Medical History: Diagnosis Date . Anxiety  . Arthritis  . Asthma  . CAD (coronary artery disease) 2007  a. 1997 s/p PCI of diagonal. . Constipation  . COPD/Asthma  . Diverticulosis  . Esophageal reflux  . Family history of adverse reaction to anesthesia   daughter had trouble breathing after surgery, had to be reintubated . Glaucoma  . HTN (hypertension), benign  . Hyperlipidemia  . Malignant melanoma ([place])   a. 09/2012 s/p resection. . Neuropathy   lower legs . PAF (paroxysmal atrial fibrillation) (HCC)   a. intolerant to beta blockers due to asthma and intolerant to CCB due to rash;  b. CHA2DS2VASc = 6-->chronic coumadin;  c. 03/2014 Echo: EF 60-65%, Gr 1 DD, triv AI, mildly dil RV, PASP 81mhg. .Marland KitchenTIA (transient ischemic attack)   a. 02/2011 - chronic coumadin in setting of PAF. .Marland KitchenUrinary, incontinence, stress female  Past Surgical History: Past Surgical History: Procedure Laterality Date . ABDOMINAL HYSTERECTOMY   . AMPUTATION Right 10/26/2019  Procedure: AMPUTATION BELOW KNEE;  Surgeon: AErle Crocker MD;  [facility] [facility]  Service: Orthopedics;  Laterality: Right; . APPENDECTOMY   . BACK SURGERY    lumbar laminectomy . CHOLECYSTECTOMY   . COLONOSCOPY   . CORONARY ANGIOPLASTY WITH STENT PLACEMENT   . EYE SURGERY   . NASAL SINUS SURGERY   . VESICOVAGINAL FISTULA CLOSURE W/ TAH   . WEIL OSTEOTOMY Left 06/12/2016  Procedure: Left [place] and [place] Left GShauna Hugh Weil Osteotomy 2nd and 3rd  Metatarsal;  Surgeon: MNewt Minion MD;  [place] [facility]  Service: Orthopedics;  Laterality: Left; HPI: 86yo admitted on 7/5 for symptomatic anemia. Has had increasing O2 requirements and per chart on 7/7 she had worsening hypoxic respiratory failure with imaging showing complete opacification of the left hemithorax. and had respiratory arrest, intubated. Extubated 7/11. Thoracentesis 7/8 and chest tube. MBS 10/2019 with aspiration thin nectar (decreased with small sips nectar) and nectar Dys 3 recommended.  No data recorded Assessment / Plan / Recommendation CHL IP CLINICAL IMPRESSIONS 01/16/2020 Clinical Impression Pt continues to exhibit significant dysphagia as present April 2021. She can achieve laryngeal closure to protect airway but it is late and thin and necar barium have  already intruded laryngeal vestibule to vocal cords and aspiration below, silently. Pt's cognition is appropriate and able to demonstrate chin tuck however aspiration ensued. Min-mild vallecular residue present but not problematic. Significant stasis from mid to distal esophagus not clearing during duration of MBS. Discussed options of nectar thick which pt did not continue in April and comfort feeds. Understandably pt stated "at my age I choose thin." Discussed with MD who placed a Palliative care consult this moring. Will order regular texture, thin liquids, pills crushed, remain upright after meals. ST will follow up minimum x 1 for further education or answer questions if needed.     SLP Visit Diagnosis Dysphagia, pharyngeal phase (R13.13) Attention and concentration deficit following -- Frontal lobe and executive function deficit following -- Impact on safety and function Moderate aspiration risk;Severe aspiration risk;Risk for inadequate nutrition/hydration   CHL IP TREATMENT RECOMMENDATION 01/16/2020 Treatment Recommendations Therapy as outlined in treatment plan below   Prognosis 01/16/2020 Prognosis for Safe Diet Advancement (No  Data) Barriers to Reach Goals Severity of deficits Barriers/Prognosis Comment -- CHL IP DIET RECOMMENDATION 01/16/2020 SLP Diet Recommendations Other (Comment) Liquid Administration via Cup;Straw Medication Administration Whole meds with puree Compensations Slow rate;Small sips/bites;Clear throat intermittently Postural Changes Remain semi-upright after after feeds/meals (Comment);Seated upright at 90 degrees   CHL IP OTHER RECOMMENDATIONS 01/16/2020 Recommended Consults -- Oral Care Recommendations Oral care BID Other Recommendations --   CHL IP FOLLOW UP RECOMMENDATIONS 01/16/2020 Follow up Recommendations None   CHL IP FREQUENCY AND DURATION 01/16/2020 Speech Therapy Frequency (ACUTE ONLY) min 1 x/week Treatment Duration 1 week      CHL IP ORAL PHASE 01/16/2020 Oral Phase WFL Oral - Pudding Teaspoon -- Oral - Pudding Cup -- Oral - Honey Teaspoon -- Oral - Honey Cup -- Oral - Nectar Teaspoon -- Oral - Nectar Cup -- Oral - Nectar Straw -- Oral - Thin Teaspoon -- Oral - Thin Cup -- Oral - Thin Straw -- Oral - Puree -- Oral - Mech Soft -- Oral - Regular -- Oral - Multi-Consistency -- Oral - Pill -- Oral Phase - Comment --  CHL IP PHARYNGEAL PHASE 01/16/2020 Pharyngeal Phase Impaired Pharyngeal- Pudding Teaspoon -- Pharyngeal -- Pharyngeal- Pudding Cup -- Pharyngeal -- Pharyngeal- Honey Teaspoon -- Pharyngeal -- Pharyngeal- Honey Cup -- Pharyngeal -- Pharyngeal- Nectar Teaspoon -- Pharyngeal -- Pharyngeal- Nectar Cup Pharyngeal residue - valleculae;Penetration/Aspiration during swallow Pharyngeal Material enters airway, passes BELOW cords without attempt by patient to eject out (silent aspiration);Material enters airway, remains ABOVE vocal cords then ejected out Pharyngeal- Nectar Straw Pharyngeal residue - valleculae Pharyngeal -- Pharyngeal- Thin Teaspoon -- Pharyngeal -- Pharyngeal- Thin Cup Penetration/Aspiration during swallow Pharyngeal Material enters airway, passes BELOW cords without attempt by patient to eject  out (silent aspiration);Material enters airway, CONTACTS cords and not ejected out Pharyngeal- Thin Straw -- Pharyngeal -- Pharyngeal- Puree -- Pharyngeal -- Pharyngeal- Mechanical Soft WFL Pharyngeal -- Pharyngeal- Regular -- Pharyngeal -- Pharyngeal- Multi-consistency -- Pharyngeal -- Pharyngeal- Pill -- Pharyngeal -- Pharyngeal Comment --  CHL IP CERVICAL ESOPHAGEAL PHASE 01/16/2020 Cervical Esophageal Phase WFL Pudding Teaspoon -- Pudding Cup -- Honey Teaspoon -- Honey Cup -- Nectar Teaspoon -- Nectar Cup -- Nectar Straw -- Thin Teaspoon -- Thin Cup -- Thin Straw -- Puree -- Mechanical Soft -- Regular -- Multi-consistency -- Pill -- Cervical Esophageal Comment -- Houston Siren 01/16/2020, 11:07 AM Orbie Pyo Colvin Caroli.Ed Risk analyst [phone] Office [phone]  Scheduled Meds: . chlorhexidine  15 mL Mouth Rinse BID  . Chlorhexidine Gluconate Cloth  6 each Topical Q0600  . citalopram  10 mg Oral Daily  . docusate sodium  100 mg Oral BID  . enoxaparin (LOVENOX) injection  1 mg/kg Subcutaneous Q12H  . feeding supplement (ENSURE ENLIVE)  237 mL Oral BID BM  . fluticasone furoate-vilanterol  1 puff Inhalation Daily  . ipratropium-albuterol  3 mL Nebulization Q6H  . mouth rinse  15 mL Mouth Rinse q12n4p  . metoprolol tartrate  5 mg Intravenous Q8H  . montelukast  10 mg Oral QHS  . multivitamin with minerals  1 tablet Oral Daily  . pantoprazole (PROTONIX) IV  40 mg Intravenous Daily  . polyethylene glycol  17 g Oral Daily  . sodium chloride flush  3 mL Intravenous Q12H  . warfarin  5 mg Oral ONCE-1600  . Warfarin - Pharmacist Dosing Inpatient   Does not apply q1600   Continuous Infusions: . sodium chloride 20 mL/hr at 01/15/20 1900     LOS: 7 days   Time spent: 34 minutes   Darliss Cheney, MD [facility]  01/16/2020, 12:56 PM   To contact the attending provider between 7A-7P or the covering provider during after hours 7P-7A, please  log into the web site [email or web address].

## 2020-01-16 NOTE — Progress Notes (Signed)
Pt complaining of shortness of breath. Pt put on 2L [place]. RT called to give pt PRN breathing treatment. Will continue to monitor.

## 2020-01-17 ENCOUNTER — Inpatient Hospital Stay (HOSPITAL_COMMUNITY): Payer: Medicare HMO

## 2020-01-17 ENCOUNTER — Ambulatory Visit: Payer: Medicare HMO

## 2020-01-17 DIAGNOSIS — E43 Unspecified severe protein-calorie malnutrition: Secondary | ICD-10-CM | POA: Insufficient documentation

## 2020-01-17 LAB — CBC WITH DIFFERENTIAL/PLATELET
Abs Immature Granulocytes: 0.04 10*3/uL (ref 0.00–0.07)
Basophils Absolute: 0 10*3/uL (ref 0.0–0.1)
Basophils Relative: 0 %
Eosinophils Absolute: 0 10*3/uL (ref 0.0–0.5)
Eosinophils Relative: 0 %
HCT: 31 % — ABNORMAL LOW (ref 36.0–46.0)
Hemoglobin: 9.5 g/dL — ABNORMAL LOW (ref 12.0–15.0)
Immature Granulocytes: 0 %
Lymphocytes Relative: 10 %
Lymphs Abs: 1.1 10*3/uL (ref 0.7–4.0)
MCH: 28.1 pg (ref 26.0–34.0)
MCHC: 30.6 g/dL (ref 30.0–36.0)
MCV: 91.7 fL (ref 80.0–100.0)
Monocytes Absolute: 0.9 10*3/uL (ref 0.1–1.0)
Monocytes Relative: 8 %
Neutro Abs: 9.1 10*3/uL — ABNORMAL HIGH (ref 1.7–7.7)
Neutrophils Relative %: 82 %
Platelets: UNDETERMINED 10*3/uL (ref 150–400)
RBC: 3.38 MIL/uL — ABNORMAL LOW (ref 3.87–5.11)
RDW: 22 % — ABNORMAL HIGH (ref 11.5–15.5)
WBC: 11.1 10*3/uL — ABNORMAL HIGH (ref 4.0–10.5)
nRBC: 0 % (ref 0.0–0.2)

## 2020-01-17 LAB — BASIC METABOLIC PANEL
Anion gap: 11 (ref 5–15)
BUN: 40 mg/dL — ABNORMAL HIGH (ref 8–23)
CO2: 26 mmol/L (ref 22–32)
Calcium: 9 mg/dL (ref 8.9–10.3)
Chloride: 104 mmol/L (ref 98–111)
Creatinine, Ser: 0.71 mg/dL (ref 0.44–1.00)
GFR calc Af Amer: >60 mL/min (ref >60)
GFR calc non Af Amer: >60 mL/min (ref >60)
Glucose, Bld: 101 mg/dL — ABNORMAL HIGH (ref 70–99)
Potassium: 3.7 mmol/L (ref 3.5–5.1)
Sodium: 141 mmol/L (ref 135–145)

## 2020-01-17 LAB — GLUCOSE, CAPILLARY
Glucose-Capillary: 97 mg/dL (ref 70–99)
Glucose-Capillary: 109 mg/dL — ABNORMAL HIGH (ref 70–99)
Glucose-Capillary: 113 mg/dL — ABNORMAL HIGH (ref 70–99)
Glucose-Capillary: 115 mg/dL — ABNORMAL HIGH (ref 70–99)

## 2020-01-17 LAB — PROTIME-INR
INR: 3.1 — ABNORMAL HIGH (ref 0.8–1.2)
Prothrombin Time: 31.2 seconds — ABNORMAL HIGH (ref 11.4–15.2)

## 2020-01-17 MED ORDER — METOPROLOL TARTRATE 25 MG PO TABS
25.0000 mg | ORAL_TABLET | Freq: Once | ORAL | Status: AC
  Administered 2020-01-17: 25 mg via ORAL
  Filled 2020-01-17: qty 1

## 2020-01-17 MED ORDER — WARFARIN SODIUM 2 MG PO TABS
2.0000 mg | ORAL_TABLET | Freq: Once | ORAL | Status: AC
  Administered 2020-01-17: 2 mg via ORAL
  Filled 2020-01-17: qty 1

## 2020-01-17 MED ORDER — METOPROLOL TARTRATE 25 MG PO TABS
25.0000 mg | ORAL_TABLET | Freq: Three times a day (TID) | ORAL | Status: DC
  Administered 2020-01-17: 25 mg via ORAL
  Filled 2020-01-17: qty 1

## 2020-01-17 MED ORDER — FLUTICASONE FUROATE-VILANTEROL 200-25 MCG/INH IN AEPB
1.0000 | INHALATION_SPRAY | Freq: Every day | RESPIRATORY_TRACT | Status: DC
  Administered 2020-01-18: 1 via RESPIRATORY_TRACT
  Filled 2020-01-17: qty 28

## 2020-01-17 MED ORDER — METOPROLOL TARTRATE 50 MG PO TABS
50.0000 mg | ORAL_TABLET | Freq: Two times a day (BID) | ORAL | Status: DC
  Administered 2020-01-17 – 2020-01-18 (×2): 50 mg via ORAL
  Filled 2020-01-17 (×2): qty 1

## 2020-01-17 MED ORDER — FLUTICASONE FUROATE-VILANTEROL 200-25 MCG/INH IN AEPB
1.0000 | INHALATION_SPRAY | Freq: Every day | RESPIRATORY_TRACT | Status: DC
  Filled 2020-01-17: qty 28

## 2020-01-17 MED ORDER — PANTOPRAZOLE SODIUM 40 MG PO TBEC
40.0000 mg | DELAYED_RELEASE_TABLET | Freq: Every day | ORAL | Status: DC
  Administered 2020-01-18: 40 mg via ORAL
  Filled 2020-01-17: qty 1

## 2020-01-17 MED ORDER — MIRTAZAPINE 15 MG PO TABS
15.0000 mg | ORAL_TABLET | Freq: Every day | ORAL | Status: DC
  Administered 2020-01-17: 15 mg via ORAL
  Filled 2020-01-17: qty 1

## 2020-01-17 MED ORDER — PROMETHAZINE HCL 25 MG PO TABS: 12.5000 mg | ORAL_TABLET | Freq: Four times a day (QID) | ORAL | Status: DC | PRN

## 2020-01-17 MED ORDER — DOCUSATE SODIUM 100 MG PO CAPS: 100.0000 mg | ORAL_CAPSULE | Freq: Two times a day (BID) | ORAL | Status: DC | PRN

## 2020-01-17 NOTE — Progress Notes (Signed)
This patient belongs to [facility].  She was initially admitted under their service and then was transferred to [facility] as she needed intubation.  I discussed with [facility] about this patient early morning today and signed out to them.  Hospitalist service will no longer be attending physician on this patient and [facility] will resume care.  I informed the floor and RN as well.

## 2020-01-17 NOTE — Progress Notes (Signed)
Grand-daughter Colletta Maryland called me in regards to diet and poor po intake. Grand-daughter asked to speak with the patient on the phone. Grand-daughter Colletta [place] told patient that if she doesn't eat her food that she cant come home. Patient/Stephanie arguing back/forth. Patient upset and states she doesn't want to eat that she is just tired and wants to die at home. Family meeting is scheduled today at [place] states she wont bring her home and watch her starve.

## 2020-01-17 NOTE — Progress Notes (Signed)
ANTICOAGULATION CONSULT NOTE - Consult  Pharmacy Consult:  Coumadin Indication: atrial fibrillation  Allergies  Allergen Reactions  . Alphagan [Brimonidine] Other (See Comments)    Burning sensation  . Apraclonidine Other (See Comments)    Pain, brow pain, tender, not able to tolerate  . Biaxin [Clarithromycin] Other (See Comments)    Unknown, per pt   . Brovana [Arformoterol] Other (See Comments)    Makes my heart race  . Budesonide Other (See Comments)    Makes my heart race  . Cifenline Itching  . Clonidine Other (See Comments)    unknown  . Clonidine Derivatives Other (See Comments)    REACTION: unknown  . Dorzolamide Other (See Comments)    Redness  . Iodinated Diagnostic Agents Other (See Comments)    unknown  . Iohexol Other (See Comments)     Desc: NOTES FROM PRIOR CT STATES PRE MEDICATION PRIOR TO OMNIPAQUE ENHANCED CT   . Nsaids Other (See Comments)    Unknown  . Penicillins Itching, Swelling and Other (See Comments)    Swelling and edema of ears and rash Swelling to ears  Has patient had a PCN reaction causing immediate rash, facial/tongue/throat swelling, SOB or lightheadedness with hypotension: {no Has patient had a PCN reaction causing severe rash involving mucus membranes or skin necrosis: {no Has patient had a PCN reaction that required hospitalization no Has patient had a PCN reaction occurring within the last 10 years: no If all of the above answers are "NO", then may proceed with Cephalosporin use.  . Sulfamethoxazole Other (See Comments)    Unknown  . Sulfonamide Derivatives Itching  . Tetracycline Itching and Other (See Comments)    unknown  . Timolol Other (See Comments)    Eye red and irritated   . Vancomycin Other (See Comments)    Turn red  . Zetia [Ezetimibe] Other (See Comments)    Unknown  . Cardizem [Diltiazem Hcl] Rash    Patient Measurements: Height: 5\' 6"  (167.6 cm) Weight: 52.8 kg (116 lb 6.5 oz) IBW/kg (Calculated) :  59.3  Vital Signs: Temp: 98 F (36.7 C) (07/14 0826) BP: 147/62 (07/14 0826) Pulse Rate: 113 (07/14 0826)  Labs: Recent Labs    01/15/20 0216 01/16/20 0338 01/17/20 0411  HGB  --  8.7* 9.5*  HCT  --  28.5* 31.0*  PLT  --  PLATELET CLUMPS NOTED ON SMEAR, UNABLE TO ESTIMATE PLATELET CLUMPS NOTED ON SMEAR, UNABLE TO ESTIMATE  LABPROT 16.1* 19.8* 31.2*  INR 1.3* 1.8* 3.1*  CREATININE  --  0.81 0.71    Estimated Creatinine Clearance: 35.8 mL/min (by C-G formula based on SCr of 0.71 mg/dL).  Assessment: 65 YOF presented with SOB and confusion, found to have left lung collapse and pleural effusion now s/p bronch, thoracentesis and chest tube insertion.  Patient has a history of Afib (CHADSVASC = 6) on Coumadin PTA.   There is discrepancy regarding home Coumadin regimen and how patient was taking it.  Home regimen listed as 2mg  PO daily and patient might have been taking 2mg  PO BID.  Warfarin not given last night d/t patient NPO on bipap  Goal of Therapy:  INR 2-3 Monitor platelets by anticoagulation protocol: Yes   Plan:  warfarin 2mg  PO today Stop enoxaparin F/u INR  Alanda Slim, PharmD, [facility] Clinical Pharmacist Please see AMION for all Pharmacists' Contact Phone Numbers 01/17/2020, 10:06 AM
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
## 2020-01-17 NOTE — Consult Note (Signed)
WOC Nurse Consult Note: Patient receiving care in [facility] 2W21.   Reason for Consult: sacral ulcer Wound type: unstageable Pressure Injury POA: Yes Measurement: 3 cm x 3 cm x unknown depth Wound bed: black Drainage (amount, consistency, odor) none Periwound: erythematous Dressing procedure/placement/frequency:  Place saline moistened gauze over the wound on the coccyx.  Cover with dry gauze.  To complete this consult I had an wound detail conversation with the primary RN, Joellen Jersey.  The Palliative Care NP was having a goals of care meeting in the room with the patient and family at the time of my visit.  Thank you for the consult.  Discussed plan of care with the bedside nurse.  [place] nurse will not follow at this time.  Please re-consult the [facility] team if needed.  Val Riles, RN, MSN, CWOCN, CNS-BC, pager [phone]

## 2020-01-17 NOTE — Discharge Summary (Signed)
Name: [name] MRN: [id number] DOB: 05-15-1924 86 y.o. PCP: Harrison Mons, PA  Date of Admission: 01/08/2020  4:30 PM Date of Discharge: 01/18/2020 Attending Physician:  Lucious Groves, DO  Discharge Diagnosis:  1. Dysphagia S/p aspiration pneumonia with LLL collapse requiring intubation Overlap Asthma-COPD Syndrome  2. Protein calorie malnutrition  3. Atrial fibrillation on home coumadin Multiple chronic hematomas on wrists   Discharge Medications: Allergies as of 01/18/2020      Reactions   Alphagan [brimonidine] Other (See Comments)   Burning sensation   Apraclonidine Other (See Comments)   Pain, brow pain, tender, not able to tolerate   Biaxin [clarithromycin] Other (See Comments)   Unknown, per pt   Brovana [arformoterol] Other (See Comments)   Makes my heart race   Budesonide Other (See Comments)   Makes my heart race   Cifenline Itching   Clonidine Other (See Comments)   unknown   Clonidine Derivatives Other (See Comments)   REACTION: unknown   Dorzolamide Other (See Comments)   Redness   Iodinated Diagnostic Agents Other (See Comments)   unknown   Iohexol Other (See Comments)    Desc: NOTES FROM PRIOR CT STATES PRE MEDICATION PRIOR TO OMNIPAQUE ENHANCED CT   Nsaids Other (See Comments)   Unknown   Penicillins Itching, Swelling, Other (See Comments)   Swelling and edema of ears and rash Swelling to ears Has patient had a PCN reaction causing immediate rash, facial/tongue/throat swelling, SOB or lightheadedness with hypotension: {no Has patient had a PCN reaction causing severe rash involving mucus membranes or skin necrosis: {no Has patient had a PCN reaction that required hospitalization no Has patient had a PCN reaction occurring within the last 10 years: no If all of the above answers are "NO", then may proceed with Cephalosporin use.   Sulfamethoxazole Other (See Comments)   Unknown   Sulfonamide Derivatives Itching   Tetracycline  Itching, Other (See Comments)   unknown   Timolol Other (See Comments)   Eye red and irritated   Vancomycin Other (See Comments)   Turn red   Zetia [ezetimibe] Other (See Comments)   Unknown   Cardizem [diltiazem Hcl] Rash      Medication List    STOP taking these medications   amLODipine 10 MG tablet Commonly known as: NORVASC   furosemide 20 MG tablet Commonly known as: LASIX   lisinopril 40 MG tablet Commonly known as: ZESTRIL   loperamide 2 MG tablet Commonly known as: IMODIUM A-D   warfarin 2 MG tablet Commonly known as: Coumadin     TAKE these medications   acetaminophen 325 MG tablet Commonly known as: TYLENOL Take 2 tablets (650 mg total) by mouth every 6 (six) hours as needed for mild pain (or Fever >/= 101).   albuterol 108 (90 Base) MCG/ACT inhaler Commonly known as: ProAir HFA Inhale 1-2 puffs into the lungs every 4 (four) hours as needed for wheezing or shortness of breath. What changed:   when to take this  Another medication with the same name was removed. Continue taking this medication, and follow the directions you see here.   ALPRAZolam 0.5 MG tablet Commonly known as: XANAX Take 1 tablet (0.5 mg total) by mouth 2 (two) times daily as needed for anxiety.   apixaban 2.5 MG Tabs tablet Commonly known as: Eliquis Take 1 tablet (2.5 mg total) by mouth 2 (two) times daily.   cetirizine 10 MG tablet Commonly known as: ZYRTEC Take 10 mg by mouth  daily as needed for allergies.   citalopram 10 MG tablet Commonly known as: CELEXA Take 10 mg by mouth daily.   clotrimazole-betamethasone cream Commonly known as: LOTRISONE Apply 1 application topically 2 (two) times daily as needed for skin breakdown.   feeding supplement (ENSURE ENLIVE) Liqd Take 237 mLs by mouth 3 (three) times daily between meals. What changed: when to take this   fluticasone 50 MCG/ACT nasal spray Commonly known as: FLONASE USE 1 SPRAY IN EACH NOSTRIL ONCE A DAY AS NEEDED  FOR ALLERGIES OR RHINITIS What changed: See the new instructions.   fluticasone furoate-vilanterol 200-25 MCG/INH Aepb Commonly known as: Breo Ellipta Inhale 1 puff into the lungs daily.   ipratropium-albuterol 0.5-2.5 (3) MG/3ML Soln Commonly known as: DUONEB Take 3 mLs by nebulization every 6 (six) hours as needed.   latanoprost 0.005 % ophthalmic solution Commonly known as: XALATAN Place 1 drop into both eyes at bedtime.   metoprolol tartrate 25 MG tablet Commonly known as: LOPRESSOR Take 2 tablets (50 mg total) by mouth 2 (two) times daily. What changed:   how much to take  when to take this   mirtazapine 15 MG disintegrating tablet Commonly known as: REMERON SOL-TAB Take 1 tablet (15 mg total) by mouth at bedtime.   montelukast 10 MG tablet Commonly known as: SINGULAIR TAKE 1 TABLET BY MOUTH EVERYDAY AT BEDTIME What changed: See the new instructions.   MULTIVITAMIN & MINERAL PO Take 1 tablet by mouth daily.   omalizumab 75 MG/0.5ML prefilled syringe Commonly known as: XOLAIR Inject 75 mg into the skin every 14 (fourteen) days.   omalizumab 150 MG/ML prefilled syringe Commonly known as: XOLAIR Inject 300 mg into the skin every 14 (fourteen) days.   pantoprazole 40 MG tablet Commonly known as: PROTONIX Take 1 tablet (40 mg total) by mouth daily.   pilocarpine 1 % ophthalmic solution Commonly known as: PILOCAR Place 1 drop into both eyes 3 (three) times daily.            Discharge Care Instructions  (From admission, onward)         Start     Ordered   01/18/20 0000  Change dressing (specify)       Comments: Make sure to have patient shifted in bed to not have constant pressure to the area.   Place a saline moistened gauze over the wound on her backside and cover with dry gauze.   01/18/20 1227          Disposition and follow-up:   [name] was discharged from [facility] in [place] condition.  At the hospital  follow up visit please address:  1. Dysphagia. S/p aspiration pneumonia with LLL collapse requiring intubation. Overlap Asthma-COPD Syndrome. Extubated 01/14/20, with no growth on left thoracentesis. BAL showed pan sensitive staph aureus. Chest tube removed on 01/16/20. Patient satting well. Ongoing risk for continued aspiration with repeat modified barium swallow showing significant stasis from mid to distal esophagus. However, patient ultimately wishes to take a more palliative approach, be comfortable, and go home. She lives at home with her great granddaughter, Colletta [place]. Discussed plan for discharge and helping patient at home. Discharged with home supplemental oxygen, home Breo, home duonebs, and singulair.   2. Protein calorie malnutrition. Patient would like to eat what she wants. Continue ensure and remeron.  3. Atrial fibrillation on home coumadin. Multiple chronic hematomas on wrists. Supratherapeutic INR while on warfarin. Switched patient to eliquis 2.68m BID. Discharged with eliquis 2.550m  BID and metoprolol 62m BID.  4.  Labs / imaging needed at time of follow-up: CBC, CMP, consider CXR in 6 weeks  5.  Pending labs/ test needing follow-up: NONE  Follow-up Appointments:  [facility]   Specialty: Home Health Services Why: [facility] The agency will contact you with restart of service date. If you have any questions please call agency at number listed above. Contact information: [phone]              Hospital Course by problem list: 1. Dysphagia. S/p aspiration pneumonia with LLL collapse requiring intubation. Overlap Asthma-COPD Syndrome. Initially presented with SOB, symptomatic anemia, and AMS. Management initially focused on treating Overlap Asthma-COPD Syndrome with home breo, duonebs q6h, albuterol nebs prn, prednisone 444m  Developed worsening respiratory symptoms 01/10/20 with CXR showing new complete left hemithorax opacification likely due to aspiration/mucus plugging. PCCM was consulted. Respiratory failure ensued and patient was intubated on 01/10/20 and transferred to ICU. CT showed complete collapse of the left lung with likely aspiration and mucus plugging and right pleural effusion with right upper lobe infiltration and RLL consolidation. Bronch performed on 01/11/20, mucus plugging found in LLL. Thoracentesis and chest tube placement performed with removal of 800cc's of straw colored fluid. BAL showed pan-sensitive staph aureus. Therapeutic right thoracentesis performed on 01/14/20 and patient extubated on same day. On 01/16/20, patient completed 7 day course of antibiotics (IV ceftriaxone) and chest tube removed. Patient was seen by SLP after extubation. NPO was recommended with repeat MBS, which showed significant stasis from mid to distal esophagus. Recommendations were discussed with patient, but patient ultimately wishes to have regular food and pursue a more comfort approach. Palliative care consulted. Ultimately, patient decided that she wants to be DNR/DNI, wants to go home (lives with her great-granddaughter StColletta [place] and wants to be able to eat whatever she wants regardless of aspiration risk. Today patient is doing well and breathing has improved significantly. Patient will be discharged home with supplemental oxygen, home breo, home duonebs, and singulair.  2. Protein calorie malnutrition. As per patient, she would like to be able to eat whatever she likes, regardless of aspiration risk. Continue ensure and remeron.  3. Atrial fibrillation on home coumadin. Multiple chronic hematomas on wrists. She has had multiple admissions due to supratherapeutic INR on warfarin. Patient had supratherapeutic INR ranging from 3-4 during current admission. Switched patient to eliquis 2.90m87mID. Discussed with TOC and patient will  be able to obtain this from [facility] [employer] reasonable price.  Discharge Vitals:   BP 131/77 (BP Location: Right Arm)    Pulse 99    Temp 98.3 F (36.8 C) (Oral)    Resp 18    Ht _0  (1.676 m)    Wt 52.8 kg    SpO2 92%    BMI 18.79 kg/m   Pertinent Labs, Studies, and Procedures:  CBC Latest Ref Rng & Units 01/17/2020 01/16/2020 01/14/2020  WBC 4.0 - 10.5 K/uL 11.1(H) 12.1(H) 6.5  Hemoglobin 12.0 - 15.0 g/dL 9.5(L) 8.7(L) 8.6(L)  Hematocrit 36 - 46 % 31.0(L) 28.5(L) 26.1(L)  Platelets 150 - 400 K/uL PLATELET CLUMPS NOTED ON SMEAR, UNABLE TO ESTIMATE PLATELET CLUMPS NOTED ON SMEAR, UNABLE TO ESTIMATE 110(L)   BMP Latest Ref Rng & Units 01/17/2020 01/16/2020 01/14/2020  Glucose 70 - 99 mg/dL 101(H) 107(H) 114(H)  BUN 8 - 23 mg/dL 40(H) 35(H) 23  Creatinine 0.44 - 1.00 mg/dL 0.71 0.81 0.71  Sodium 135 - 145 mmol/L 141 137 134(L)  Potassium 3.5 - 5.1 mmol/L 3.7 3.9 3.9  Chloride 98 - 111 mmol/L 104 100 101  CO2 22 - 32 mmol/L _0 Calcium 8.9 - 10.3 mg/dL 9.0 9.3 7.8(L)   PT - 20.8 INR - 1.9  Urinalysis    Component Value Date/Time   COLORURINE YELLOW 01/09/2020 0125   APPEARANCEUR HAZY (A) 01/09/2020 0125   LABSPEC 1.014 01/09/2020 0125   PHURINE 6.0 01/09/2020 0125   GLUCOSEU NEGATIVE 01/09/2020 0125   HGBUR SMALL (A) 01/09/2020 0125   BILIRUBINUR NEGATIVE 01/09/2020 0125   KETONESUR NEGATIVE 01/09/2020 0125   PROTEINUR 100 (A) 01/09/2020 0125   UROBILINOGEN 0.2 03/05/2015 1600   NITRITE NEGATIVE 01/09/2020 0125   LEUKOCYTESUR MODERATE (A) 01/09/2020 0125   Iron/TIBC/Ferritin/ %Sat    Component Value Date/Time   IRON 72 01/09/2020 0743   TIBC 353 01/09/2020 0743   FERRITIN 62 01/09/2020 0743   IRONPCTSAT 20 01/09/2020 0743    EKG: Afib with RVR, nonspecific T wave abnormalities, unchanged from priors.  Imaging:  PORTABLE CHEST 1 VIEW (01/10/20) IMPRESSION: 1. Complete opacification of the left hemithorax consistent with a large left  pleural effusion and associated atelectasis and/or infiltrate. 2. Mild right basilar atelectasis and/or infiltrate. 3. Small right pleural effusion.   CT CHEST WITHOUT CONTRAST (01/10/20) IMPRESSION: Opacification of the left hemithorax secondary to complete consolidation and collapse of the left lung with associated large pleural effusion. Considerable dense material is noted within the bronchial tree on the left likely related to aspiration and/or mucous plugging.  Large right-sided pleural effusion with right upper lobe infiltrate and right lower lobe consolidation. Mild inspissated material is noted in the lower lobe bronchi on right as well.  Aneurysmal dilatation of the ascending aorta as described. Recommend annual imaging followup by CTA or MRA.   Bronchoscopy Procedure Note (01/11/20) Endobronchial findings: Thick inspissated sputum in the left lower lobe bronchus. Trachea: Normal mucosa Carina: Normal mucosa Right main bronchus: Normal mucosa Right upper lobe bronchus: Normal mucosa Right middle lobe bronchus: Narrow.  Normal mucosa Right lower lobe bronchus: Normal mucosa Left main bronchus: Normal mucosa Left upper lobe bronchus: Normal mucosa Left lower lobe bronchus: Collapse with mucus plugging   CHEST  1 VIEW (01/17/20) IMPRESSION: Aortic atherosclerosis. Stable left basilar opacity is noted concerning for effusion with associated atelectasis. Stable right basilar atelectasis and small right pleural effusion.    Discharge Instructions: Discharge Instructions    Change dressing (specify)   Complete by: As directed    Make sure to have patient shifted in bed to not have constant pressure to the area.   Place a saline moistened gauze over the wound on her backside and cover with dry gauze.   Diet - low sodium heart healthy   Complete by: As directed    Increase activity slowly   Complete by: As directed       Signed: Virl Axe, MD 01/19/2020,  11:05 AM   Pager: [phone]

## 2020-01-17 NOTE — Progress Notes (Addendum)
Hospital Course:   [name] is a 86 yo female with PMH dementia AoC anemia, poorly controlled COPD & asthma, HTN, CAD, osteomyelitis s/p R BKA (10/2019), afib on warfarin, dysphagia initially admitted 7/5 for symptomatic anemia and new onset respiratory failure. She has a history of dysphagia and was on dysphagia 3 diet in April 2021 but was placed on regular diet after going to rehab. There was some concern for aspiration.   Patient developed worsening respiratory status requiring intubation on 7/7 and was transferred to the ICU. CT showed complete collapse of the left lung with likely aspiration and mucous plugging and right pleural effusion with right upper lobe infiltrate and RLL consolidation.   7/8 bronchoscopy was done, mucous plugging found in LLL. Thoracentesis and chest tube placement also done with 800cc's of straw colored fluid removed.  7//11 thereapeutic right thoracentesis done and extubated.  7/13 completed antibiotics for 7 day course total  7/13 Chest tube removed.   Patient was seen by SLP after extubation. NPO was recommended with repeat MBS, which showed significant stasis from mid to distal esophagus. Recommendations were discussed with patient, and she preferred to have regular food and pursue a more comfort approach.   Palliative care was consulted. Patient lives with her great granddaughter Colletta [place], but her granddaughter Earley Brooke is her HCPOA. There are possibly some conflicts between family decision making, but Ronique indicated that she wanted to [name] to continue making decisions for her, and [name] also indicated she would want her daughter Colletta [place] to be included in decision making and to act as surrogate.  Patient has advanced directive which indicates that she would want no artificial life prolonging measures in the event of uncurable illness, advanced dementia, or unconscious state unlikely to resolve. Patient also stated that she would not want  artificial meeting.  Further discussion was had with the patient, and she decided that she would want to be DNR/DNI, that she would like to go home, and that she would like to be able to eat what she wanted regardless of aspiration risk.  Another meeting will be held today at 1pm with palliative care.   Patient seen this morning at bedside, she is not feeling that well. She endorses shortness of breath slightly increased, although she had taken off her [place]. She feels as if she "has the flu." She denies fever or chills, just feels very tired. No cough. She endorses nausea, no vomiting some abdominal pain. Per nursing she had three BM yesterday and one this morning that were loose. She denies dysuria or difficulty with urination.  She has swellings on her wrists bilaterally and states they have been there for months, she is unsure what they are.    Objective:  Vital signs in last 24 hours: Vitals:   01/16/20 2000 01/16/20 2052 01/16/20 2209 01/17/20 0500  BP:  118/68 128/82   Pulse:  93    Resp: 18 14    Temp:  98 F (36.7 C)    TempSrc:      SpO2:  97%    Weight:    52.8 kg  Height:        Constitution: NAD, appears stated age, chronically ill appearing HENT: [place]/AT, no icterus or injection Cardio: irregular rate & rhythm, no m/r/g, no LE edema  Respiratory: mild crackles bases bilaterally but +breath sounds, otherwise CTA Abdominal: NTTP, soft, non-distended MSK: right BKA, moving all extremities Neuro: normal affect, alert & oriented GU: purewick, urine yellow Skin: right  and left dorsal wrist swellings soft with overlying dry eschar vs. Blood, nttp  Bronchial alveolar lavage culture: Staph aureus, pan sensitive  Left Thora culture 7/8: no growth   Assessment/Plan:  Principal Problem:   Acute respiratory failure with hypoxia (HCC) Active Problems:   Asthma-COPD overlap syndrome (HCC)   Atrial fibrillation (HCC)   Acute on chronic anemia   Atelectasis of left lung    Bilateral pleural effusion   Aspiration pneumonia (HCC)   Frailty syndrome in geriatric patient   DNR (do not resuscitate)   Palliative care by specialist   Goals of care, counseling/discussion  [name] is a 86 yo female with PMH dementia AoC anemia, poorly controlled COPD & asthma, HTN, CAD, osteomyelitis s/p R BKA (10/2019), afib on warfarin, dysphagia initially admitted 7/5 for symptomatic anemia and new onset respiratory failure requiring intubation. She was extubated 7/11 but having persistent dysphagia and aspiration. She would prefer to pursue a more comfortable course and is back on a regular diet with plans for palliative meeting today at 1pm.   Dysphagia  S/p aspiration pneumonia with LLL collapse requiring intubation  Overlap Asthma-COPD Syndrome  Extubated 7/11, no growth on left thora. BAL showed pan sensitive staph aureus. L chest tube was removed yesterday and she is satting well on room air and has clear breath sounds. Ongoing risk for continued aspiration with MBS yesterday showing significant stasis from mid to distal esophagus but patient ultimately wishes to take a more palliative approach, be comfortable, and go home. She lives at home with her great granddaughter, Colletta [place], and there is some discrepancy in goals as Colletta [place] is concerned to bring her home as she is not eating and is worried about her aspiration risk. She is otherwise stable for discharge although may need discharge with supplemental oxygen.   - appreciate ongoing discussions with palliative and their input  - cont. Regular diet, SLP recommends assistance with patient eating and allowing her to remain upright after eating - duonebs nebulizer q6h prn  - PT/OT; sit up, ambulate with oxygen if possible - reorder Breo ellipta inhaler  - cont. singulair  Protein calorie malnutrition She has decreased appetite which is chronic prior to admission. Previously on remeron.   - cont. Ensure -  restart home remeron qhs - consult to dietician   Afib on home coumadin Multiple Chronic Hematomas Wrists Home medications include metop 25 mg qid and warfarin. Recently on lopressor IV due to being intubated. She is currently in afib with mildly elevated rate.  She has bilateral chronic appearing hematomas on her forearms. Per chart review she has a history of supratherpeutic INR and this is likely the source of her bruising which has not reabsorbed. Wounds are not open and do not bother her currently. INR is now 3.1. She will need close monitoring for this or consider alternative therapy after determining goals of care with palliative.   - switch IV metop to metop 50 mg bid po  - cont. coumadin  Sacral Decubitus Ulcer Dressing in place. No previous note from [facility] and do not see current consult.   - consult to wound care   Anxiety - cont. Home alprazolam .72m prn  - cont. Home celexa   Loose stools Previously requiring miralax daily and colace bid. She is now having loose stools, 4x yesterday and once today. She was recently on antibiotics but is afebrile, white count slightly increased yesterday, 12.1 >11.1 today.   - change colace and miralax to prn for  now   VTE: warfarin IVF: none Diet: regular Code: DNR  Dispo: Anticipated discharge pending family decision and palliative care meeting.   Ardis Lawley A, DO 01/17/2020, 7:27 AM Pager: [phone] After 5pm on weekdays and 1pm on weekends: On Call Pager: [phone]

## 2020-01-17 NOTE — Evaluation (Signed)
Physical Therapy Evaluation Patient Details Name: [name] MRN: [id number] DOB: [birth year] Today's Date: 01/17/2020   History of Present Illness  86yo female who presented to the ED with SOB, weakness, confusion/AMS which has been increasing over the past week per family, found to have acute respiratory failure and CAP. CTH negative, patient then found to have PTX and chest tube was placed 7/8 then appears to have been removed 7/13-7/14. She was intubated and eventually extubated but exact dates are unclear in chart. PMH urinary incontinence, TIA, PAF, neuropathy, HTN/HLD, COPD, CAD, back surgery, R BKA  Clinical Impression   Patient received in bed, pleasantly confused and worked well with PT today. Did need Min-ModA for all bed mobility, and constant MinA to maintain sitting balance midline at EOB as well. Attempted a partial stand with totalAx1 and "bear hug" technique but only able to get to partial standing position/unable to maintain. Tachycardic up to 110BPM (87% of age predicted HR max), SPO2 WNL on 2LPM with activity. Will require +2 to progress mobility. Returned to bed and positioned to comfort, left with all needs met and bed alarm active; she continues to refuse to eat and is not interested in her lunch. Pending outcomes of family conference this afternoon, currently recommending SNF versus home with hospice/comfort care; will plan to see 2x/week unless family agrees to comfort care in which case PT will pull out of her POC.    Follow Up Recommendations Other (comment) (SNF versus hospice/comfort care)    Equipment Recommendations  Wheelchair (measurements PT);Wheelchair cushion (measurements PT);Hospital bed    Recommendations for Other Services       Precautions / Restrictions Precautions Precautions: Fall;Other (comment) Precaution Comments: watch HR/O2, chronic R BKA, confusion Restrictions Weight Bearing Restrictions: No      Mobility  Bed Mobility Overal  bed mobility: Needs Assistance Bed Mobility: Supine to Sit;Sit to Supine     Supine to sit: Mod assist Sit to supine: Min assist   General bed mobility comments: for BLE and trunk management; once sitting at EOB generally needed MinA to maintain upright due to posterior and L lean. HR 90-110BPM.  Transfers Overall transfer level: Needs assistance Equipment used: 1 person hand held assist Transfers: Sit to/from Stand Sit to Stand: Total assist         General transfer comment: only able ot perform perhaps 1/3 of a stand with one person, poor initiation and effort  Ambulation/Gait             General Gait Details: unable- unsafe with one person and tachycardic  Stairs            Wheelchair Mobility    Modified Rankin (Stroke Patients Only)       Balance Overall balance assessment: Needs assistance Sitting-balance support: Bilateral upper extremity supported;Feet supported Sitting balance-Leahy Scale: Poor Sitting balance - Comments: MinA to maintain midline upright sitting at EOB Postural control: Posterior lean;Left lateral lean Standing balance support: No upper extremity supported;During functional activity Standing balance-Leahy Scale: Zero Standing balance comment: totalA/reliance on PT to maintain balance for partial stand                             Pertinent Vitals/Pain Pain Assessment: Faces Pain Score: 0-No pain Faces Pain Scale: No hurt Pain Intervention(s): Monitored during session;Limited activity within patient's tolerance;Repositioned    Home Living Family/patient expects to be discharged to:: Private residence Living Arrangements: Other (Comment) (lives with great  grand daughter) Available Help at Discharge: Family;Available PRN/intermittently (fmily works during the day) Type of Home: House Home Access: Stairs to enter   [employer] of Steps: pt unable to state Home Layout: Two level Home Equipment: Environmental consultant - 4  wheels;Shower seat;Wheelchair - manual      Prior Function Level of Independence: Independent with assistive device(s)         Comments: per patient- family not present to confirm. Patient unreliable historian.     Hand Dominance        Extremity/Trunk Assessment   Upper Extremity Assessment Upper Extremity Assessment: Generalized weakness    Lower Extremity Assessment Lower Extremity Assessment: Generalized weakness    Cervical / Trunk Assessment Cervical / Trunk Assessment: Kyphotic  Communication   Communication: No difficulties  Cognition Arousal/Alertness: Awake/alert Behavior During Therapy: Flat affect Overall Cognitive Status: No family/caregiver present to determine baseline cognitive functioning Area of Impairment: Orientation;Attention;Memory;Following commands;Safety/judgement;Awareness;Problem solving                 Orientation Level: Disoriented to;Place;Time;Situation Current Attention Level: Sustained Memory: Decreased short-term memory;Decreased recall of precautions Following Commands: Follows one step commands inconsistently;Follows one step commands with increased time Safety/Judgement: Decreased awareness of safety;Decreased awareness of deficits Awareness: Intellectual Problem Solving: Slow processing;Decreased initiation;Difficulty sequencing;Requires verbal cues;Requires tactile cues General Comments: followed simple cues approximately 60% of the time, but very confused during session and told me she was 86 years old, then 26 when asked again. Asked PT to "shoot the phone so it stops ringing". Possible visual hallucinations as she told me I stepped on the cat.      General Comments      Exercises     Assessment/Plan    PT Assessment Patient needs continued PT services  PT Problem List Decreased strength;Decreased cognition;Decreased knowledge of use of DME;Decreased activity tolerance;Decreased safety awareness;Decreased  balance;Decreased knowledge of precautions;Decreased mobility;Decreased coordination;Cardiopulmonary status limiting activity       PT Treatment Interventions DME instruction;Balance training;Cognitive remediation;Functional mobility training;Patient/family education;Therapeutic activities;Therapeutic exercise;Gait training    PT Goals (Current goals can be found in the Care Plan section)  Acute Rehab PT Goals PT Goal Formulation: Patient unable to participate in goal setting Time For Goal Achievement: 01/31/20 Potential to Achieve Goals: Fair    Frequency Min 2X/week   Barriers to discharge        Co-evaluation               AM-PAC PT "6 Clicks" Mobility  Outcome Measure Help needed turning from your back to your side while in a flat bed without using bedrails?: A Little Help needed moving from lying on your back to sitting on the side of a flat bed without using bedrails?: A Lot Help needed moving to and from a bed to a chair (including a wheelchair)?: A Lot Help needed standing up from a chair using your arms (e.g., wheelchair or bedside chair)?: Total Help needed to walk in hospital room?: Total Help needed climbing 3-5 steps with a railing? : Total 6 Click Score: 10    End of Session Equipment Utilized During Treatment: Gait belt;Oxygen Activity Tolerance: Patient tolerated treatment well Patient left: in bed;with call bell/phone within reach;with bed alarm set Nurse Communication: Mobility status PT Visit Diagnosis: Muscle weakness (generalized) (M62.81);Other abnormalities of gait and mobility (R26.89);Difficulty in walking, not elsewhere classified (R26.2)    Time: 0454-0981 PT Time Calculation (min) (ACUTE ONLY): 18 min   Charges:   PT Evaluation $PT Eval High Complexity: 1  High          Windell Norfolk, DPT, PN1   Supplemental Physical Therapist [place]    Pager [phone] Acute Rehab Office [phone]

## 2020-01-18 LAB — GLUCOSE, CAPILLARY
Glucose-Capillary: 104 mg/dL — ABNORMAL HIGH (ref 70–99)
Glucose-Capillary: 107 mg/dL — ABNORMAL HIGH (ref 70–99)

## 2020-01-18 LAB — PROTIME-INR
INR: 1.9 — ABNORMAL HIGH (ref 0.8–1.2)
Prothrombin Time: 20.8 seconds — ABNORMAL HIGH (ref 11.4–15.2)

## 2020-01-18 MED ORDER — WARFARIN SODIUM 2 MG PO TABS
4.0000 mg | ORAL_TABLET | Freq: Once | ORAL | Status: DC
  Filled 2020-01-18: qty 2

## 2020-01-18 MED ORDER — PANTOPRAZOLE SODIUM 40 MG PO TBEC: 40.0000 mg | DELAYED_RELEASE_TABLET | Freq: Every day | ORAL | 0 refills | Status: AC

## 2020-01-18 MED ORDER — APIXABAN 2.5 MG PO TABS: 2.5000 mg | ORAL_TABLET | Freq: Two times a day (BID) | ORAL | 1 refills | Status: AC

## 2020-01-18 MED ORDER — METOPROLOL TARTRATE 25 MG PO TABS: 50.0000 mg | ORAL_TABLET | Freq: Two times a day (BID) | ORAL | Status: AC

## 2020-01-18 MED ORDER — IPRATROPIUM-ALBUTEROL 0.5-2.5 (3) MG/3ML IN SOLN: 3.0000 mL | Freq: Four times a day (QID) | RESPIRATORY_TRACT | 0 refills | Status: AC | PRN

## 2020-01-18 NOTE — Progress Notes (Addendum)
Oxygen Therapy   Treatment Indications: Pt lying down room air SPO2 88% Sitting up room air SPO2 85%     Treatment Goals: 2 L [place] SPO2 98% sitting up    Plan of Care: Will recommend 2 L O2 for SPO2 > 90%

## 2020-01-18 NOTE — Progress Notes (Signed)
  Speech Language Pathology  Patient Details Name: [name] MRN: [id number] DOB: October 24, 1923 Today's Date: 01/18/2020 Time:  -     Chart reviewed. Notes from Palliative care read. ST will sign off for now. Thank you.             [name] 01/18/2020, 8:10 AM   [name] Colvin Caroli.Ed Risk analyst [phone] Office [phone]

## 2020-01-18 NOTE — Progress Notes (Signed)
Hydrologist [facility])  Hospital Liaison: RN note         Notified by [facility] manager of patient/family request for [facility] Palliative services at home after discharge.         Writer spoke with great-granddaughter and primary caregiver Colletta [place] to confirm interest and explain services.               [facility] Palliative team will follow up with patient after discharge.         Please call with any hospice or palliative related questions.         Thank you for this referral.         Domenic Moras, BSN, RN [place] (listed on [place] under Hospice/[facility])    [phone]

## 2020-01-18 NOTE — Progress Notes (Signed)
ANTICOAGULATION CONSULT NOTE - Consult  Pharmacy Consult:  Coumadin Indication: atrial fibrillation  Allergies  Allergen Reactions  . Alphagan [Brimonidine] Other (See Comments)    Burning sensation  . Apraclonidine Other (See Comments)    Pain, brow pain, tender, not able to tolerate  . Biaxin [Clarithromycin] Other (See Comments)    Unknown, per pt   . Brovana [Arformoterol] Other (See Comments)    Makes my heart race  . Budesonide Other (See Comments)    Makes my heart race  . Cifenline Itching  . Clonidine Other (See Comments)    unknown  . Clonidine Derivatives Other (See Comments)    REACTION: unknown  . Dorzolamide Other (See Comments)    Redness  . Iodinated Diagnostic Agents Other (See Comments)    unknown  . Iohexol Other (See Comments)     Desc: NOTES FROM PRIOR CT STATES PRE MEDICATION PRIOR TO OMNIPAQUE ENHANCED CT   . Nsaids Other (See Comments)    Unknown  . Penicillins Itching, Swelling and Other (See Comments)    Swelling and edema of ears and rash Swelling to ears  Has patient had a PCN reaction causing immediate rash, facial/tongue/throat swelling, SOB or lightheadedness with hypotension: {no Has patient had a PCN reaction causing severe rash involving mucus membranes or skin necrosis: {no Has patient had a PCN reaction that required hospitalization no Has patient had a PCN reaction occurring within the last 10 years: no If all of the above answers are "NO", then may proceed with Cephalosporin use.  . Sulfamethoxazole Other (See Comments)    Unknown  . Sulfonamide Derivatives Itching  . Tetracycline Itching and Other (See Comments)    unknown  . Timolol Other (See Comments)    Eye red and irritated   . Vancomycin Other (See Comments)    Turn red  . Zetia [Ezetimibe] Other (See Comments)    Unknown  . Cardizem [Diltiazem Hcl] Rash    Patient Measurements: Height: 5\' 6"  (167.6 cm) Weight: 52.8 kg (116 lb 6.5 oz) IBW/kg (Calculated) :  59.3  Vital Signs: Temp: 98.3 F (36.8 C) (07/15 0522) Temp Source: Oral (07/15 0522) BP: 131/77 (07/15 0522) Pulse Rate: 99 (07/15 0522)  Labs: Recent Labs    01/16/20 0338 01/17/20 0411 01/18/20 0548  HGB 8.7* 9.5*  --   HCT 28.5* 31.0*  --   PLT PLATELET CLUMPS NOTED ON SMEAR, UNABLE TO ESTIMATE PLATELET CLUMPS NOTED ON SMEAR, UNABLE TO ESTIMATE  --   LABPROT 19.8* 31.2* 20.8*  INR 1.8* 3.1* 1.9*  CREATININE 0.81 0.71  --     Estimated Creatinine Clearance: 35.8 mL/min (by C-G formula based on SCr of 0.71 mg/dL).  Assessment: 63 YOF presented with SOB and confusion, found to have left lung collapse and pleural effusion now s/p bronch, thoracentesis and chest tube insertion.  Patient has a history of Afib (CHADSVASC = 6) on Coumadin PTA.   There is discrepancy regarding home Coumadin regimen and how patient was taking it.  Home regimen listed as 2mg  PO daily and patient might have been taking 2mg  PO BID.   Goal of Therapy:  INR 2-3 Monitor platelets by anticoagulation protocol: Yes   Plan:  warfarin 4mg  PO today F/u INR  Alanda Slim, PharmD, [facility] Clinical Pharmacist Please see AMION for all Pharmacists' Contact Phone Numbers 01/18/2020, 11:06 AM

## 2020-01-18 NOTE — TOC Transition Note (Signed)
Transition of Care [facility]) - CM/SW Discharge Note   Patient Details  Name: [name] MRN: [id number] Date of Birth: 03/12/1924  [facility]) CM/SW Contact:  Angelita Ingles, RN Phone Number: [phone]  01/18/2020, 2:21 PM   Clinical Narrative:    CM consulted for DME and [place] set up . O2 ordered through [employer] notified and O2 to be delivered. Outpatient palliative consult called to [facility] RN with [facility]. [place] set to resume with previous [place]. Tiffany made aware of discharge and resumption of services. NO further needs noted at this time CM will sign off.     Final next level of care: Other (comment) (Home with Palliative Care) Barriers to Discharge: No Barriers Identified   Patient Goals and CMS Choice Patient states their goals for this hospitalization and ongoing recovery are:: Ready to go home CMS Medicare.gov Compare Post Acute Care list provided to:: Patient Choice offered to / list presented to : Patient  Discharge Placement                       Discharge Plan and Services In-house Referral: Clinical Social Work Discharge Planning Services: CM Consult Post Acute Care Choice: Home Health          DME Arranged: Oxygen DME Agency: Other - Comment [place]) Date DME Agency Contacted: 01/18/20 Time DME Agency Contacted: 1438 Representative spoke with at DME Agency: [place]on [facility] HH Arranged: RN, Nurse's Aide [place] Agency: [facility] Date [place]: 01/18/20 Time [place]: [place] Representative spoke with at [place]: [place] ([place]) Interventions     Readmission Risk Interventions Readmission Risk Prevention Plan 10/26/2019  Transportation Screening Complete  PCP or Specialist Appt within 3-5 Days Complete  HRI or [place] Complete  Social Work Consult for [place] Planning/Counseling [place] Screening Not Applicable   Medication Review Press photographer) Complete  Some recent data might be hidden

## 2020-01-18 NOTE — Progress Notes (Addendum)
Occupational Therapy Evaluation Patient Details Name: [name] MRN: [id number] DOB: 29-Jul-1923 Today's Date: 01/18/2020    History of Present Illness 86yo female who presented to the ED with SOB, weakness, confusion/AMS which has been increasing over the past week per family, found to have acute respiratory failure and CAP. CTH negative, patient then found to have PTX and chest tube was placed 7/8 then appears to have been removed 7/13-7/14. She was intubated and eventually extubated but exact dates are unclear in chart. PMH urinary incontinence, TIA, PAF, neuropathy, HTN/HLD, COPD, CAD, back surgery, R BKA   Clinical Impression   Pt presents with the diagnoses above and deficits mentioned below (see OT problem list). Pt very pleasant and willing to attempt all activities during OT eval. Pt currently requires Max A for bed mobility, Mod A for sitting balance EOB during functional tasks with prominent posterior/left lateral lean. Trialed use of Stedy for sit to stand transfer, but unable to successfully complete with Max A and one person assist. Pt requires Min A to Mod A for UB ADLs, Max A to Total A for LB ADLs. Noted that family is in discussion with palliative and/or hospice care. If family declines hospice care, recommend SNF for short term rehab to maximize independence as pt requires extensive physical assist for daily tasks.   Update: Spoke with Research scientist (life sciences), Colletta Maryland. She is with pt 24/7, able to provide physical assist. Has drop arm commode, tub bench, sliding board, walkers, and 2 wheelchairs. Still interested in hospice services, but undecided.     Follow Up Recommendations  SNF;Supervision/Assistance - 24 hour (vs hospice?)    Equipment Recommendations  Hospital bed;Other (comment) Harrel Lemon lift)    Recommendations for Other Services       Precautions / Restrictions Precautions Precautions: Fall;Other (comment) Precaution Comments: watch HR/O2, chronic R  BKA, confusion Restrictions Weight Bearing Restrictions: No      Mobility Bed Mobility Overal bed mobility: Needs Assistance Bed Mobility: Supine to Sit;Sit to Supine     Supine to sit: Max assist Sit to supine: Mod assist   General bed mobility comments: Max A to sit EOB with HHA for advancing trunk and B LE safely. Pt Mod A for sit to supine with cues for leaning to R side, assistance needed to lift LEs  Transfers Overall transfer level: Needs assistance   Transfers: Sit to/from Stand Sit to Stand: Max assist         General transfer comment: Max A x 1 for sit to stand attempt with Stedy. pt unable to acheive full stance (maybe 50% of stance). Would need 2 person assist for successful stedy transfer    Balance Overall balance assessment: Needs assistance Sitting-balance support: Bilateral upper extremity supported;Feet supported Sitting balance-Leahy Scale: Poor Sitting balance - Comments: Static sitting balance min guard, but when distracted and attempting to complete activities, pt requires Mod A to maintain sitting balance EOB Postural control: Posterior lean;Left lateral lean Standing balance support: Bilateral upper extremity supported;During functional activity Standing balance-Leahy Scale: Zero Standing balance comment: Total A to maintain standing                           ADL either performed or assessed with clinical judgement   ADL Overall ADL's : Needs assistance/impaired Eating/Feeding: Sitting;Minimal assistance Eating/Feeding Details (indicate cue type and reason): Min A for drinking from cup sitting EOB Grooming: Moderate assistance;Sitting;Oral care;Wash/dry face Grooming Details (indicate cue type and reason): Min A  for washing face, Mod A for denture care sititng EOB as pt with heavy posterior lean when distracted Upper Body Bathing: Moderate assistance;Sitting   Lower Body Bathing: Total assistance;Bed level   Upper Body Dressing :  Minimal assistance;Sitting   Lower Body Dressing: Total assistance;Bed level       Toileting- Clothing Manipulation and Hygiene: Total assistance;Bed level         General ADL Comments: Pt with deficits in cognition impacting awareness of other deficits. Other deficits include decreased strength, sitting/standing balance, endurance requiring extensive assist for LB ADLs and transfers     Vision Baseline Vision/History: Wears glasses Wears Glasses: At all times Patient Visual Report: Blurring of vision Vision Assessment?: Vision impaired- to be further tested in functional context Additional Comments: Pt with decreased coordination in reaching for bed rail and Stedy, overshooting and undershootng     Perception     Praxis Praxis Praxis tested?: Deficits Deficits: Limb apraxia Praxis-Other Comments: difficulty coordinating movement when reaching for bed rail, overshooting and undershooting. Unsure if due to lack of wearing glasses (pt reports she does not typically wear them though she should) or praxis deficits    Pertinent Vitals/Pain Pain Assessment: Faces Faces Pain Scale: Hurts a little bit Pain Location: sacral wound with bed mobility  Pain Descriptors / Indicators: Grimacing;Sore Pain Intervention(s): Limited activity within patient's tolerance;Monitored during session;Repositioned     Hand Dominance Right   Extremity/Trunk Assessment Upper Extremity Assessment Upper Extremity Assessment: Generalized weakness   Lower Extremity Assessment Lower Extremity Assessment: Generalized weakness   Cervical / Trunk Assessment Cervical / Trunk Assessment: Kyphotic;Other exceptions (L lateral lean)   Communication Communication Communication: No difficulties   Cognition Arousal/Alertness: Awake/alert Behavior During Therapy: WFL for tasks assessed/performed Overall Cognitive Status: History of cognitive impairments - at baseline Area of Impairment:  Orientation;Attention;Memory;Following commands;Safety/judgement;Awareness;Problem solving                 Orientation Level: Disoriented to;Place;Time;Situation (recall september, [facility]) Current Attention Level: Sustained Memory: Decreased short-term memory;Decreased recall of precautions Following Commands: Follows one step commands consistently Safety/Judgement: Decreased awareness of safety;Decreased awareness of deficits Awareness: Intellectual Problem Solving: Slow processing;Decreased initiation;Difficulty sequencing;Requires verbal cues;Requires tactile cues General Comments: Follows one step commands consistently, A&Ox1. Confusion with STM deficits regarding CLOF and assistance received at home.    General Comments  HR up to 118bpm, SpO2 WFL on 2 L O2.     Exercises     Shoulder Instructions      Home Living Family/patient expects to be discharged to:: Private residence Living Arrangements: Alone Available Help at Discharge: Family;Available PRN/intermittently (family works during the day) Type of Home: House Home Access: Stairs to enter Technical brewer of Steps: 1 Entrance Stairs-Rails: Right Home Layout: One level     Bathroom Shower/Tub: Occupational psychologist: Handicapped height Bathroom Accessibility: Yes   Home Equipment: Environmental consultant - 4 wheels;Wheelchair - manual;Shower seat   Additional Comments: prior living obtained from previous hospital admission. pt poor historian      Prior Functioning/Environment Level of Independence: Independent with assistive device(s);Needs assistance  Gait / Transfers Assistance Needed: Pt reports assistance for shower transfers. Reports able to transfer to wheelchair without assistance ADL's / [place] Needed: Pt reports aide 2x/wk for showers and family assists with showers as needed. Pt reports able to dress self. Assistance with IADLs   Comments: Pt poor historian. Pt able to  recall going to rehab after BKA, but confused A&Ox1  OT Problem List: Decreased strength;Decreased activity tolerance;Impaired balance (sitting and/or standing);Decreased coordination;Decreased cognition;Decreased safety awareness;Decreased knowledge of use of DME or AE;Cardiopulmonary status limiting activity      OT Treatment/Interventions: Self-care/ADL training;Therapeutic exercise;Energy conservation;DME and/or AE instruction;Therapeutic activities;Patient/family education    OT Goals(Current goals can be found in the care plan section) Acute Rehab OT Goals Patient Stated Goal: get well OT Goal Formulation: With patient Time For Goal Achievement: 02/01/20 Potential to Achieve Goals: Fair ADL Goals Pt Will Perform Grooming: with supervision;sitting Pt Will Perform Upper Body Bathing: with min guard assist;sitting Pt Will Perform Lower Body Bathing: with min assist;sitting/lateral leans Additional ADL Goal #1: Pt to demonstrate ability to complete sit to stand transfer with no more than Mod A using LRAD in preparation for ADL transfers and to reduce caregiver burden  OT Frequency: Min 2X/week   Barriers to D/C:            Co-evaluation              AM-PAC OT "6 Clicks" Daily Activity     Outcome Measure Help from another person eating meals?: A Little Help from another person taking care of personal grooming?: A Lot Help from another person toileting, which includes using toliet, bedpan, or urinal?: Total Help from another person bathing (including washing, rinsing, drying)?: A Lot Help from another person to put on and taking off regular upper body clothing?: A Little Help from another person to put on and taking off regular lower body clothing?: Total 6 Click Score: 12   End of Session Equipment Utilized During Treatment: Gait belt;Oxygen Nurse Communication: Mobility status  Activity Tolerance: Patient limited by fatigue Patient left: in bed;with call  bell/phone within reach;with bed alarm set  OT Visit Diagnosis: Unsteadiness on feet (R26.81);Other abnormalities of gait and mobility (R26.89);Muscle weakness (generalized) (M62.81);Other symptoms and signs involving cognitive function                Time: 1859-0931 OT Time Calculation (min): 29 min Charges:  OT General Charges $OT Visit: 1 Visit OT Evaluation $OT Eval Moderate Complexity: 1 Mod OT Treatments $Self Care/Home Management : 8-22 mins  Layla Maw, OTR/L  Layla Maw 01/18/2020, 9:22 AM

## 2020-01-18 NOTE — Care Management Important Message (Signed)
Important Message  Patient Details  Name: [name] MRN: [id number] Date of Birth: [birth year]   Medicare Important Message Given:  Yes     Aston Lawhorn Montine Circle 01/18/2020, 3:32 PM

## 2020-01-18 NOTE — Progress Notes (Addendum)
° °  Subjective:   She is feeling well this morning, has no shortness of breath or pain. She would really like to brush her teeth.   Objective:  Vital signs in last 24 hours: Vitals:   01/17/20 1803 01/17/20 2100 01/17/20 2219 01/18/20 0522  BP: 137/82 127/79 (!) 144/81 131/77  Pulse: 100 88 98 99  Resp: _0 Temp:  97.9 F (36.6 C)  98.3 F (36.8 C)  TempSrc:  Oral  Oral  SpO2: 99% 97% 92% 92%  Weight:      Height:       Constitution: NAD, supine in bed HENT: [place]/AT Eyes: no icterus or injection Respiratory: non-labored breathing, on room air MSK: moving all extremities, right BKA Skin: right and left dorsal wrist swellings soft with overlying dry eschar vs. Blood, nttp   Assessment/Plan:  Principal Problem:   Acute respiratory failure with hypoxia (HCC) Active Problems:   Asthma-COPD overlap syndrome (HCC)   Atrial fibrillation (HCC)   Acute on chronic anemia   Atelectasis of left lung   Bilateral pleural effusion   Aspiration pneumonia (HCC)   Frailty syndrome in geriatric patient   DNR (do not resuscitate)   Palliative care by specialist   Goals of care, counseling/discussion   Protein-calorie malnutrition, severe  86yo female with COPD, asthma, dysphagia with aspiration s/p intubation with extubation 7/11. Patient is pursuing a palliative course and will discharge home today with family.   Dysphagia  S/p aspiration pneumonia with LLL collapse requiring intubation  Overlap Asthma-COPD Syndrome Extubated 7/11, no growth on left thora. BAL showed pan sensitive staph aureus. L chest tube was removed yesterday and she is satting well on room air and has clear breath sounds. Ongoing risk for continued aspiration with MBS yesterday showing significant stasis from mid to distal esophagus but patient ultimately wishes to take a more palliative approach, be comfortable, and go home. She lives at home with her great granddaughter, Colletta [place]. Discussed plan for  discharge and helping patient at home yesterday. Currently patient is doing well and has significantly improved breathing. She will discharge with home supplemental oxygen.   - DME home O2 - cont regular diet., remain upright after eating - cont Breo, home duonebs, and singulair   Protein Calorie Malnutrition - cont ensure  - cont. remeron  Afib on home coumadin Will switch patient to eliquis 2.5 mg bid as she has had multiple admissions due to supratherpeutic INR.   - stop warfarin - start eliquis 2.5 mg bid. Previously discussed with TOC and she will be able to obtain this from [place] or [facility] for reasonable price.   Prior to Admission Living Arrangement: Anticipated Discharge Location: Barriers to Discharge: Dispo: Anticipated discharge in approximately today.   Molli Hazard A, DO 01/18/2020, 12:08 PM Pager: [phone]

## 2020-01-19 ENCOUNTER — Telehealth: Payer: Self-pay | Admitting: Internal Medicine

## 2020-01-19 NOTE — Telephone Encounter (Signed)
[place], rep Joe stated that patient has 2 accounts and the old account under Dr. Lake Bells was still active. They are merging and updating her count based on the newest prescriber forms and will fax new approval letter to the office. Will update once we receive a response.

## 2020-01-19 NOTE — Telephone Encounter (Signed)
[place]. Spoke with Judeen Hammans to answer questions about [employer] as well as Scientist, physiological. All was taken care of for the financial information and then it will be sent to the pharmacy for verification. Stated that it would take about 3 business days to be processed and then pharmacy should be able to get shipment scheduled. Will route to injection pool.

## 2020-01-23 ENCOUNTER — Ambulatory Visit: Payer: Medicare HMO

## 2020-01-23 NOTE — Telephone Encounter (Signed)
Received a fax from  [place] regarding an approval for XOLAIR from 01/19/2020 until further notice. Patient will receive free medication from [facility].   Phone number: [phone]  Pharmacy [facility]# [phone]

## 2020-01-24 NOTE — Telephone Encounter (Signed)
Patient has Xolair at office.  Patient was scheduled 01/23/20 for injection and cancelled. Will continue to follow up to reschedule injection appointment.

## 2020-01-29 ENCOUNTER — Other Ambulatory Visit: Payer: Self-pay | Admitting: Internal Medicine

## 2020-01-29 NOTE — Telephone Encounter (Signed)
Called [facility], spoke with [place]. Xolair clarification was requested. Patient is to receive 375mg  every 2 weeks. Patient last IgE level and weight given. Nothing further at this time.

## 2020-02-04 DEATH — deceased
